# Patient Record
Sex: Female | Born: 1945
Health system: Southern US, Community
[De-identification: ages and names within clinical notes are randomized; demographics above are authoritative.]

## PROBLEM LIST (undated history)

## (undated) DIAGNOSIS — J449 Chronic obstructive pulmonary disease, unspecified: Secondary | ICD-10-CM

## (undated) DIAGNOSIS — R001 Bradycardia, unspecified: Secondary | ICD-10-CM

## (undated) DIAGNOSIS — K52832 Lymphocytic colitis: Secondary | ICD-10-CM

## (undated) DIAGNOSIS — K220 Achalasia of cardia: Secondary | ICD-10-CM

## (undated) DIAGNOSIS — C3490 Malignant neoplasm of unspecified part of unspecified bronchus or lung: Secondary | ICD-10-CM

## (undated) DIAGNOSIS — D649 Anemia, unspecified: Secondary | ICD-10-CM

## (undated) DIAGNOSIS — K3184 Gastroparesis: Secondary | ICD-10-CM

## (undated) DIAGNOSIS — J189 Pneumonia, unspecified organism: Secondary | ICD-10-CM

## (undated) DIAGNOSIS — M1990 Unspecified osteoarthritis, unspecified site: Secondary | ICD-10-CM

## (undated) DIAGNOSIS — K219 Gastro-esophageal reflux disease without esophagitis: Secondary | ICD-10-CM

## (undated) HISTORY — DX: Lymphocytic colitis: K52.832

## (undated) HISTORY — DX: Bradycardia, unspecified: R00.1

## (undated) HISTORY — PX: DILATION AND CURETTAGE OF UTERUS: SHX78

---

## 1978-11-29 HISTORY — PX: NECK SURGERY: SHX720

## 1998-01-18 ENCOUNTER — Other Ambulatory Visit: Admission: RE | Admit: 1998-01-18 | Discharge: 1998-01-18 | Payer: Self-pay | Admitting: Obstetrics and Gynecology

## 1999-02-12 ENCOUNTER — Other Ambulatory Visit: Admission: RE | Admit: 1999-02-12 | Discharge: 1999-02-12 | Payer: Self-pay | Admitting: Obstetrics and Gynecology

## 2000-03-09 ENCOUNTER — Other Ambulatory Visit: Admission: RE | Admit: 2000-03-09 | Discharge: 2000-03-09 | Payer: Self-pay | Admitting: Obstetrics and Gynecology

## 2001-04-06 ENCOUNTER — Other Ambulatory Visit: Admission: RE | Admit: 2001-04-06 | Discharge: 2001-04-06 | Payer: Self-pay | Admitting: Obstetrics and Gynecology

## 2002-07-13 ENCOUNTER — Other Ambulatory Visit: Admission: RE | Admit: 2002-07-13 | Discharge: 2002-07-13 | Payer: Self-pay | Admitting: Obstetrics and Gynecology

## 2003-09-03 ENCOUNTER — Other Ambulatory Visit: Admission: RE | Admit: 2003-09-03 | Discharge: 2003-09-03 | Payer: Self-pay | Admitting: Obstetrics and Gynecology

## 2004-10-16 ENCOUNTER — Other Ambulatory Visit: Admission: RE | Admit: 2004-10-16 | Discharge: 2004-10-16 | Payer: Self-pay | Admitting: Obstetrics and Gynecology

## 2008-03-30 DIAGNOSIS — C3490 Malignant neoplasm of unspecified part of unspecified bronchus or lung: Secondary | ICD-10-CM

## 2008-03-30 HISTORY — DX: Malignant neoplasm of unspecified part of unspecified bronchus or lung: C34.90

## 2009-03-30 HISTORY — PX: BRONCHOSCOPY: SUR163

## 2009-08-15 ENCOUNTER — Encounter: Admission: RE | Admit: 2009-08-15 | Discharge: 2009-08-15 | Payer: Self-pay | Admitting: Family Medicine

## 2009-08-27 ENCOUNTER — Ambulatory Visit: Payer: Self-pay | Admitting: Thoracic Surgery

## 2009-08-30 ENCOUNTER — Ambulatory Visit: Payer: Self-pay | Admitting: Thoracic Surgery

## 2009-08-30 ENCOUNTER — Ambulatory Visit (HOSPITAL_COMMUNITY): Admission: RE | Admit: 2009-08-30 | Discharge: 2009-08-30 | Payer: Self-pay | Admitting: Thoracic Surgery

## 2009-09-03 ENCOUNTER — Ambulatory Visit: Payer: Self-pay | Admitting: Internal Medicine

## 2009-09-04 ENCOUNTER — Ambulatory Visit (HOSPITAL_COMMUNITY): Admission: RE | Admit: 2009-09-04 | Discharge: 2009-09-04 | Payer: Self-pay | Admitting: Thoracic Surgery

## 2009-09-05 ENCOUNTER — Ambulatory Visit: Payer: Self-pay | Admitting: Internal Medicine

## 2009-09-05 ENCOUNTER — Ambulatory Visit: Admission: RE | Admit: 2009-09-05 | Discharge: 2009-09-26 | Payer: Self-pay | Admitting: Radiation Oncology

## 2009-09-10 LAB — CBC WITH DIFFERENTIAL/PLATELET
BASO%: 0.6 % (ref 0.0–2.0)
Basophils Absolute: 0.1 10*3/uL (ref 0.0–0.1)
EOS%: 0.6 % (ref 0.0–7.0)
Eosinophils Absolute: 0.1 10*3/uL (ref 0.0–0.5)
HCT: 41.9 % (ref 34.8–46.6)
HGB: 14.4 g/dL (ref 11.6–15.9)
LYMPH%: 13.4 % — ABNORMAL LOW (ref 14.0–49.7)
MCH: 29.7 pg (ref 25.1–34.0)
MCHC: 34.4 g/dL (ref 31.5–36.0)
MCV: 86.4 fL (ref 79.5–101.0)
MONO#: 1.2 10*3/uL — ABNORMAL HIGH (ref 0.1–0.9)
MONO%: 14.6 % — ABNORMAL HIGH (ref 0.0–14.0)
NEUT#: 5.8 10*3/uL (ref 1.5–6.5)
NEUT%: 70.8 % (ref 38.4–76.8)
Platelets: 256 10*3/uL (ref 145–400)
RBC: 4.85 10*6/uL (ref 3.70–5.45)
RDW: 13.2 % (ref 11.2–14.5)
WBC: 8.1 10*3/uL (ref 3.9–10.3)
lymph#: 1.1 10*3/uL (ref 0.9–3.3)

## 2009-09-10 LAB — COMPREHENSIVE METABOLIC PANEL
ALT: 11 U/L (ref 0–35)
AST: 19 U/L (ref 0–37)
Albumin: 3.9 g/dL (ref 3.5–5.2)
Alkaline Phosphatase: 61 U/L (ref 39–117)
BUN: 7 mg/dL (ref 6–23)
CO2: 27 mEq/L (ref 19–32)
Calcium: 9.9 mg/dL (ref 8.4–10.5)
Chloride: 105 mEq/L (ref 96–112)
Creatinine, Ser: 0.76 mg/dL (ref 0.40–1.20)
Glucose, Bld: 108 mg/dL — ABNORMAL HIGH (ref 70–99)
Potassium: 4.3 mEq/L (ref 3.5–5.3)
Sodium: 138 mEq/L (ref 135–145)
Total Bilirubin: 1.2 mg/dL (ref 0.3–1.2)
Total Protein: 6.7 g/dL (ref 6.0–8.3)

## 2009-09-17 LAB — CBC WITH DIFFERENTIAL/PLATELET
BASO%: 0.9 % (ref 0.0–2.0)
Basophils Absolute: 0 10*3/uL (ref 0.0–0.1)
EOS%: 0.7 % (ref 0.0–7.0)
Eosinophils Absolute: 0 10*3/uL (ref 0.0–0.5)
HCT: 37.9 % (ref 34.8–46.6)
HGB: 12.9 g/dL (ref 11.6–15.9)
LYMPH%: 19.4 % (ref 14.0–49.7)
MCH: 30.2 pg (ref 25.1–34.0)
MCHC: 34.2 g/dL (ref 31.5–36.0)
MCV: 88.4 fL (ref 79.5–101.0)
MONO#: 0.1 10*3/uL (ref 0.1–0.9)
MONO%: 3.9 % (ref 0.0–14.0)
NEUT#: 2.2 10*3/uL (ref 1.5–6.5)
NEUT%: 75.1 % (ref 38.4–76.8)
Platelets: 98 10*3/uL — ABNORMAL LOW (ref 145–400)
RBC: 4.28 10*6/uL (ref 3.70–5.45)
RDW: 13.6 % (ref 11.2–14.5)
WBC: 2.9 10*3/uL — ABNORMAL LOW (ref 3.9–10.3)
lymph#: 0.6 10*3/uL — ABNORMAL LOW (ref 0.9–3.3)

## 2009-09-17 LAB — COMPREHENSIVE METABOLIC PANEL
ALT: 17 U/L (ref 0–35)
AST: 13 U/L (ref 0–37)
Albumin: 4.3 g/dL (ref 3.5–5.2)
Alkaline Phosphatase: 90 U/L (ref 39–117)
BUN: 18 mg/dL (ref 6–23)
CO2: 27 mEq/L (ref 19–32)
Calcium: 10.1 mg/dL (ref 8.4–10.5)
Chloride: 101 mEq/L (ref 96–112)
Creatinine, Ser: 0.77 mg/dL (ref 0.40–1.20)
Glucose, Bld: 117 mg/dL — ABNORMAL HIGH (ref 70–99)
Potassium: 3.9 mEq/L (ref 3.5–5.3)
Sodium: 135 mEq/L (ref 135–145)
Total Bilirubin: 1.2 mg/dL (ref 0.3–1.2)
Total Protein: 6.7 g/dL (ref 6.0–8.3)

## 2009-09-24 LAB — CBC WITH DIFFERENTIAL/PLATELET
BASO%: 0.1 % (ref 0.0–2.0)
Basophils Absolute: 0 10*3/uL (ref 0.0–0.1)
EOS%: 0.2 % (ref 0.0–7.0)
Eosinophils Absolute: 0 10*3/uL (ref 0.0–0.5)
HCT: 33.3 % — ABNORMAL LOW (ref 34.8–46.6)
HGB: 11.6 g/dL (ref 11.6–15.9)
LYMPH%: 9.2 % — ABNORMAL LOW (ref 14.0–49.7)
MCH: 30.4 pg (ref 25.1–34.0)
MCHC: 35 g/dL (ref 31.5–36.0)
MCV: 86.9 fL (ref 79.5–101.0)
MONO#: 0.3 10*3/uL (ref 0.1–0.9)
MONO%: 3.7 % (ref 0.0–14.0)
NEUT#: 7.5 10*3/uL — ABNORMAL HIGH (ref 1.5–6.5)
NEUT%: 86.8 % — ABNORMAL HIGH (ref 38.4–76.8)
Platelets: 27 10*3/uL — ABNORMAL LOW (ref 145–400)
RBC: 3.83 10*6/uL (ref 3.70–5.45)
RDW: 13.3 % (ref 11.2–14.5)
WBC: 8.6 10*3/uL (ref 3.9–10.3)
lymph#: 0.8 10*3/uL — ABNORMAL LOW (ref 0.9–3.3)

## 2009-09-24 LAB — COMPREHENSIVE METABOLIC PANEL
ALT: 16 U/L (ref 0–35)
AST: 13 U/L (ref 0–37)
Albumin: 3.9 g/dL (ref 3.5–5.2)
Alkaline Phosphatase: 106 U/L (ref 39–117)
BUN: 8 mg/dL (ref 6–23)
CO2: 27 mEq/L (ref 19–32)
Calcium: 9.7 mg/dL (ref 8.4–10.5)
Chloride: 104 mEq/L (ref 96–112)
Creatinine, Ser: 0.84 mg/dL (ref 0.40–1.20)
Glucose, Bld: 104 mg/dL — ABNORMAL HIGH (ref 70–99)
Potassium: 3.9 mEq/L (ref 3.5–5.3)
Sodium: 140 mEq/L (ref 135–145)
Total Bilirubin: 0.3 mg/dL (ref 0.3–1.2)
Total Protein: 6.2 g/dL (ref 6.0–8.3)

## 2009-09-27 ENCOUNTER — Ambulatory Visit: Admission: RE | Admit: 2009-09-27 | Discharge: 2009-10-29 | Payer: Self-pay | Admitting: Radiation Oncology

## 2009-10-01 LAB — COMPREHENSIVE METABOLIC PANEL
ALT: 10 U/L (ref 0–35)
AST: 12 U/L (ref 0–37)
Albumin: 4 g/dL (ref 3.5–5.2)
Alkaline Phosphatase: 73 U/L (ref 39–117)
BUN: 8 mg/dL (ref 6–23)
CO2: 24 mEq/L (ref 19–32)
Calcium: 10.1 mg/dL (ref 8.4–10.5)
Chloride: 106 mEq/L (ref 96–112)
Creatinine, Ser: 0.68 mg/dL (ref 0.40–1.20)
Glucose, Bld: 95 mg/dL (ref 70–99)
Potassium: 3.9 mEq/L (ref 3.5–5.3)
Sodium: 140 mEq/L (ref 135–145)
Total Bilirubin: 0.4 mg/dL (ref 0.3–1.2)
Total Protein: 6.3 g/dL (ref 6.0–8.3)

## 2009-10-01 LAB — CBC WITH DIFFERENTIAL/PLATELET
BASO%: 0.3 % (ref 0.0–2.0)
Basophils Absolute: 0 10*3/uL (ref 0.0–0.1)
EOS%: 0.1 % (ref 0.0–7.0)
Eosinophils Absolute: 0 10*3/uL (ref 0.0–0.5)
HCT: 31.3 % — ABNORMAL LOW (ref 34.8–46.6)
HGB: 10.7 g/dL — ABNORMAL LOW (ref 11.6–15.9)
LYMPH%: 6.7 % — ABNORMAL LOW (ref 14.0–49.7)
MCH: 29.1 pg (ref 25.1–34.0)
MCHC: 34.2 g/dL (ref 31.5–36.0)
MCV: 85.1 fL (ref 79.5–101.0)
MONO#: 1 10*3/uL — ABNORMAL HIGH (ref 0.1–0.9)
MONO%: 11 % (ref 0.0–14.0)
NEUT#: 7.3 10*3/uL — ABNORMAL HIGH (ref 1.5–6.5)
NEUT%: 81.9 % — ABNORMAL HIGH (ref 38.4–76.8)
Platelets: 376 10*3/uL (ref 145–400)
RBC: 3.68 10*6/uL — ABNORMAL LOW (ref 3.70–5.45)
RDW: 13.4 % (ref 11.2–14.5)
WBC: 8.9 10*3/uL (ref 3.9–10.3)
lymph#: 0.6 10*3/uL — ABNORMAL LOW (ref 0.9–3.3)
nRBC: 0 % (ref 0–0)

## 2009-10-03 ENCOUNTER — Ambulatory Visit: Payer: Self-pay | Admitting: Internal Medicine

## 2009-10-08 LAB — CBC WITH DIFFERENTIAL/PLATELET
BASO%: 0 % (ref 0.0–2.0)
Basophils Absolute: 0 10*3/uL (ref 0.0–0.1)
EOS%: 0.1 % (ref 0.0–7.0)
Eosinophils Absolute: 0 10*3/uL (ref 0.0–0.5)
HCT: 28.3 % — ABNORMAL LOW (ref 34.8–46.6)
HGB: 9.9 g/dL — ABNORMAL LOW (ref 11.6–15.9)
LYMPH%: 4.4 % — ABNORMAL LOW (ref 14.0–49.7)
MCH: 30.7 pg (ref 25.1–34.0)
MCHC: 34.9 g/dL (ref 31.5–36.0)
MCV: 87.9 fL (ref 79.5–101.0)
MONO#: 0 10*3/uL — ABNORMAL LOW (ref 0.1–0.9)
MONO%: 0.4 % (ref 0.0–14.0)
NEUT#: 7.8 10*3/uL — ABNORMAL HIGH (ref 1.5–6.5)
NEUT%: 95.1 % — ABNORMAL HIGH (ref 38.4–76.8)
Platelets: 273 10*3/uL (ref 145–400)
RBC: 3.22 10*6/uL — ABNORMAL LOW (ref 3.70–5.45)
RDW: 13.7 % (ref 11.2–14.5)
WBC: 8.2 10*3/uL (ref 3.9–10.3)
lymph#: 0.4 10*3/uL — ABNORMAL LOW (ref 0.9–3.3)

## 2009-10-08 LAB — COMPREHENSIVE METABOLIC PANEL
ALT: 12 U/L (ref 0–35)
AST: 13 U/L (ref 0–37)
Albumin: 3.9 g/dL (ref 3.5–5.2)
Alkaline Phosphatase: 91 U/L (ref 39–117)
BUN: 17 mg/dL (ref 6–23)
CO2: 25 mEq/L (ref 19–32)
Calcium: 10 mg/dL (ref 8.4–10.5)
Chloride: 102 mEq/L (ref 96–112)
Creatinine, Ser: 0.83 mg/dL (ref 0.40–1.20)
Glucose, Bld: 106 mg/dL — ABNORMAL HIGH (ref 70–99)
Potassium: 3.8 mEq/L (ref 3.5–5.3)
Sodium: 138 mEq/L (ref 135–145)
Total Bilirubin: 1.4 mg/dL — ABNORMAL HIGH (ref 0.3–1.2)
Total Protein: 6.3 g/dL (ref 6.0–8.3)

## 2009-10-15 LAB — CBC WITH DIFFERENTIAL/PLATELET
BASO%: 0.2 % (ref 0.0–2.0)
Basophils Absolute: 0 10*3/uL (ref 0.0–0.1)
EOS%: 0 % (ref 0.0–7.0)
Eosinophils Absolute: 0 10*3/uL (ref 0.0–0.5)
HCT: 25.5 % — ABNORMAL LOW (ref 34.8–46.6)
HGB: 9 g/dL — ABNORMAL LOW (ref 11.6–15.9)
LYMPH%: 6 % — ABNORMAL LOW (ref 14.0–49.7)
MCH: 30.9 pg (ref 25.1–34.0)
MCHC: 35.2 g/dL (ref 31.5–36.0)
MCV: 87.8 fL (ref 79.5–101.0)
MONO#: 0.4 10*3/uL (ref 0.1–0.9)
MONO%: 5 % (ref 0.0–14.0)
NEUT#: 7.4 10*3/uL — ABNORMAL HIGH (ref 1.5–6.5)
NEUT%: 88.8 % — ABNORMAL HIGH (ref 38.4–76.8)
Platelets: 15 10*3/uL — ABNORMAL LOW (ref 145–400)
RBC: 2.91 10*6/uL — ABNORMAL LOW (ref 3.70–5.45)
RDW: 12.9 % (ref 11.2–14.5)
WBC: 8.3 10*3/uL (ref 3.9–10.3)
lymph#: 0.5 10*3/uL — ABNORMAL LOW (ref 0.9–3.3)

## 2009-10-15 LAB — COMPREHENSIVE METABOLIC PANEL
ALT: 20 U/L (ref 0–35)
AST: 18 U/L (ref 0–37)
Albumin: 3.7 g/dL (ref 3.5–5.2)
Alkaline Phosphatase: 91 U/L (ref 39–117)
BUN: 6 mg/dL (ref 6–23)
CO2: 30 mEq/L (ref 19–32)
Calcium: 9.7 mg/dL (ref 8.4–10.5)
Chloride: 103 mEq/L (ref 96–112)
Creatinine, Ser: 0.98 mg/dL (ref 0.40–1.20)
Glucose, Bld: 100 mg/dL — ABNORMAL HIGH (ref 70–99)
Potassium: 3.4 mEq/L — ABNORMAL LOW (ref 3.5–5.3)
Sodium: 139 mEq/L (ref 135–145)
Total Bilirubin: 0.6 mg/dL (ref 0.3–1.2)
Total Protein: 6.3 g/dL (ref 6.0–8.3)

## 2009-10-18 ENCOUNTER — Ambulatory Visit (HOSPITAL_COMMUNITY): Admission: RE | Admit: 2009-10-18 | Discharge: 2009-10-18 | Payer: Self-pay | Admitting: Internal Medicine

## 2009-10-22 LAB — CBC WITH DIFFERENTIAL/PLATELET
BASO%: 0.2 % (ref 0.0–2.0)
Basophils Absolute: 0 10*3/uL (ref 0.0–0.1)
EOS%: 0 % (ref 0.0–7.0)
Eosinophils Absolute: 0 10*3/uL (ref 0.0–0.5)
HCT: 25.4 % — ABNORMAL LOW (ref 34.8–46.6)
HGB: 8.7 g/dL — ABNORMAL LOW (ref 11.6–15.9)
LYMPH%: 8.6 % — ABNORMAL LOW (ref 14.0–49.7)
MCH: 29.2 pg (ref 25.1–34.0)
MCHC: 34.3 g/dL (ref 31.5–36.0)
MCV: 85.2 fL (ref 79.5–101.0)
MONO#: 0.6 10*3/uL (ref 0.1–0.9)
MONO%: 5.1 % (ref 0.0–14.0)
NEUT#: 9.8 10*3/uL — ABNORMAL HIGH (ref 1.5–6.5)
NEUT%: 86.1 % — ABNORMAL HIGH (ref 38.4–76.8)
Platelets: 203 10*3/uL (ref 145–400)
RBC: 2.98 10*6/uL — ABNORMAL LOW (ref 3.70–5.45)
RDW: 14.3 % (ref 11.2–14.5)
WBC: 11.4 10*3/uL — ABNORMAL HIGH (ref 3.9–10.3)
lymph#: 1 10*3/uL (ref 0.9–3.3)

## 2009-10-22 LAB — COMPREHENSIVE METABOLIC PANEL
ALT: <8 U/L (ref 0–35)
AST: 14 U/L (ref 0–37)
Albumin: 3.8 g/dL (ref 3.5–5.2)
Alkaline Phosphatase: 77 U/L (ref 39–117)
BUN: 8 mg/dL (ref 6–23)
CO2: 21 mEq/L (ref 19–32)
Calcium: 9.5 mg/dL (ref 8.4–10.5)
Chloride: 103 mEq/L (ref 96–112)
Creatinine, Ser: 0.91 mg/dL (ref 0.40–1.20)
Glucose, Bld: 115 mg/dL — ABNORMAL HIGH (ref 70–99)
Potassium: 3.5 mEq/L (ref 3.5–5.3)
Sodium: 138 mEq/L (ref 135–145)
Total Bilirubin: 0.4 mg/dL (ref 0.3–1.2)
Total Protein: 6.2 g/dL (ref 6.0–8.3)

## 2009-10-29 ENCOUNTER — Encounter (HOSPITAL_COMMUNITY): Admission: RE | Admit: 2009-10-29 | Discharge: 2009-12-24 | Payer: Self-pay | Admitting: Internal Medicine

## 2009-10-30 LAB — TYPE & CROSSMATCH - CHCC

## 2009-11-05 ENCOUNTER — Ambulatory Visit: Payer: Self-pay | Admitting: Internal Medicine

## 2009-11-05 LAB — COMPREHENSIVE METABOLIC PANEL
ALT: 25 U/L (ref 0–35)
AST: 20 U/L (ref 0–37)
Albumin: 4.1 g/dL (ref 3.5–5.2)
Alkaline Phosphatase: 100 U/L (ref 39–117)
BUN: 6 mg/dL (ref 6–23)
CO2: 21 mEq/L (ref 19–32)
Calcium: 9.3 mg/dL (ref 8.4–10.5)
Chloride: 105 mEq/L (ref 96–112)
Creatinine, Ser: 0.77 mg/dL (ref 0.40–1.20)
Glucose, Bld: 85 mg/dL (ref 70–99)
Potassium: 3.5 mEq/L (ref 3.5–5.3)
Sodium: 140 mEq/L (ref 135–145)
Total Bilirubin: 0.5 mg/dL (ref 0.3–1.2)
Total Protein: 6.1 g/dL (ref 6.0–8.3)

## 2009-11-05 LAB — CBC WITH DIFFERENTIAL/PLATELET
BASO%: 0 % (ref 0.0–2.0)
Basophils Absolute: 0 10*3/uL (ref 0.0–0.1)
EOS%: 0.6 % (ref 0.0–7.0)
Eosinophils Absolute: 0.1 10*3/uL (ref 0.0–0.5)
HCT: 24.5 % — ABNORMAL LOW (ref 34.8–46.6)
HGB: 8.7 g/dL — ABNORMAL LOW (ref 11.6–15.9)
LYMPH%: 6 % — ABNORMAL LOW (ref 14.0–49.7)
MCH: 31.5 pg (ref 25.1–34.0)
MCHC: 35.7 g/dL (ref 31.5–36.0)
MCV: 88.3 fL (ref 79.5–101.0)
MONO#: 0.6 10*3/uL (ref 0.1–0.9)
MONO%: 5.9 % (ref 0.0–14.0)
NEUT#: 8.5 10*3/uL — ABNORMAL HIGH (ref 1.5–6.5)
NEUT%: 87.5 % — ABNORMAL HIGH (ref 38.4–76.8)
Platelets: 11 10*3/uL — ABNORMAL LOW (ref 145–400)
RBC: 2.77 10*6/uL — ABNORMAL LOW (ref 3.70–5.45)
RDW: 14 % (ref 11.2–14.5)
WBC: 9.7 10*3/uL (ref 3.9–10.3)
lymph#: 0.6 10*3/uL — ABNORMAL LOW (ref 0.9–3.3)

## 2009-11-12 LAB — CBC WITH DIFFERENTIAL/PLATELET
BASO%: 0.4 % (ref 0.0–2.0)
Basophils Absolute: 0 10*3/uL (ref 0.0–0.1)
EOS%: 0.4 % (ref 0.0–7.0)
Eosinophils Absolute: 0 10*3/uL (ref 0.0–0.5)
HCT: 25.6 % — ABNORMAL LOW (ref 34.8–46.6)
HGB: 8.9 g/dL — ABNORMAL LOW (ref 11.6–15.9)
LYMPH%: 7.6 % — ABNORMAL LOW (ref 14.0–49.7)
MCH: 30 pg (ref 25.1–34.0)
MCHC: 34.8 g/dL (ref 31.5–36.0)
MCV: 86.2 fL (ref 79.5–101.0)
MONO#: 0.9 10*3/uL (ref 0.1–0.9)
MONO%: 11.4 % (ref 0.0–14.0)
NEUT#: 6 10*3/uL (ref 1.5–6.5)
NEUT%: 80.2 % — ABNORMAL HIGH (ref 38.4–76.8)
Platelets: 161 10*3/uL (ref 145–400)
RBC: 2.97 10*6/uL — ABNORMAL LOW (ref 3.70–5.45)
RDW: 15.5 % — ABNORMAL HIGH (ref 11.2–14.5)
WBC: 7.5 10*3/uL (ref 3.9–10.3)
lymph#: 0.6 10*3/uL — ABNORMAL LOW (ref 0.9–3.3)
nRBC: 0 % (ref 0–0)

## 2009-11-12 LAB — COMPREHENSIVE METABOLIC PANEL
ALT: 10 U/L (ref 0–35)
AST: 15 U/L (ref 0–37)
Albumin: 4 g/dL (ref 3.5–5.2)
Alkaline Phosphatase: 86 U/L (ref 39–117)
BUN: 6 mg/dL (ref 6–23)
CO2: 23 mEq/L (ref 19–32)
Calcium: 9.1 mg/dL (ref 8.4–10.5)
Chloride: 107 mEq/L (ref 96–112)
Creatinine, Ser: 0.71 mg/dL (ref 0.40–1.20)
Glucose, Bld: 106 mg/dL — ABNORMAL HIGH (ref 70–99)
Potassium: 3.3 mEq/L — ABNORMAL LOW (ref 3.5–5.3)
Sodium: 141 mEq/L (ref 135–145)
Total Bilirubin: 0.4 mg/dL (ref 0.3–1.2)
Total Protein: 6.1 g/dL (ref 6.0–8.3)

## 2009-11-19 LAB — COMPREHENSIVE METABOLIC PANEL
ALT: 9 U/L (ref 0–35)
AST: 11 U/L (ref 0–37)
Albumin: 4 g/dL (ref 3.5–5.2)
Alkaline Phosphatase: 91 U/L (ref 39–117)
BUN: 15 mg/dL (ref 6–23)
CO2: 24 mEq/L (ref 19–32)
Calcium: 9.2 mg/dL (ref 8.4–10.5)
Chloride: 101 mEq/L (ref 96–112)
Creatinine, Ser: 0.54 mg/dL (ref 0.40–1.20)
Glucose, Bld: 105 mg/dL — ABNORMAL HIGH (ref 70–99)
Potassium: 3.6 mEq/L (ref 3.5–5.3)
Sodium: 136 mEq/L (ref 135–145)
Total Bilirubin: 1.5 mg/dL — ABNORMAL HIGH (ref 0.3–1.2)
Total Protein: 6.2 g/dL (ref 6.0–8.3)

## 2009-11-19 LAB — CBC WITH DIFFERENTIAL/PLATELET
BASO%: 0.9 % (ref 0.0–2.0)
Basophils Absolute: 0 10*3/uL (ref 0.0–0.1)
EOS%: 0.1 % (ref 0.0–7.0)
Eosinophils Absolute: 0 10*3/uL (ref 0.0–0.5)
HCT: 21.2 % — ABNORMAL LOW (ref 34.8–46.6)
HGB: 7.5 g/dL — ABNORMAL LOW (ref 11.6–15.9)
LYMPH%: 10.5 % — ABNORMAL LOW (ref 14.0–49.7)
MCH: 32.3 pg (ref 25.1–34.0)
MCHC: 35.4 g/dL (ref 31.5–36.0)
MCV: 91.4 fL (ref 79.5–101.0)
MONO#: 0.1 10*3/uL (ref 0.1–0.9)
MONO%: 2.8 % (ref 0.0–14.0)
NEUT#: 2.5 10*3/uL (ref 1.5–6.5)
NEUT%: 85.7 % — ABNORMAL HIGH (ref 38.4–76.8)
Platelets: 122 10*3/uL — ABNORMAL LOW (ref 145–400)
RBC: 2.32 10*6/uL — ABNORMAL LOW (ref 3.70–5.45)
RDW: 16.5 % — ABNORMAL HIGH (ref 11.2–14.5)
WBC: 2.9 10*3/uL — ABNORMAL LOW (ref 3.9–10.3)
lymph#: 0.3 10*3/uL — ABNORMAL LOW (ref 0.9–3.3)

## 2009-11-20 LAB — TYPE & CROSSMATCH - CHCC

## 2009-11-29 ENCOUNTER — Ambulatory Visit (HOSPITAL_COMMUNITY): Admission: RE | Admit: 2009-11-29 | Discharge: 2009-11-29 | Payer: Self-pay | Admitting: Internal Medicine

## 2009-12-03 LAB — COMPREHENSIVE METABOLIC PANEL
ALT: 12 U/L (ref 0–35)
AST: 16 U/L (ref 0–37)
Albumin: 3.3 g/dL — ABNORMAL LOW (ref 3.5–5.2)
Alkaline Phosphatase: 80 U/L (ref 39–117)
BUN: 4 mg/dL — ABNORMAL LOW (ref 6–23)
CO2: 30 mEq/L (ref 19–32)
Calcium: 8.6 mg/dL (ref 8.4–10.5)
Chloride: 102 mEq/L (ref 96–112)
Creatinine, Ser: 0.78 mg/dL (ref 0.40–1.20)
Glucose, Bld: 100 mg/dL — ABNORMAL HIGH (ref 70–99)
Potassium: 3.2 mEq/L — ABNORMAL LOW (ref 3.5–5.3)
Sodium: 138 mEq/L (ref 135–145)
Total Bilirubin: 0.5 mg/dL (ref 0.3–1.2)
Total Protein: 5.7 g/dL — ABNORMAL LOW (ref 6.0–8.3)

## 2009-12-03 LAB — CBC WITH DIFFERENTIAL/PLATELET
BASO%: 0.1 % (ref 0.0–2.0)
Basophils Absolute: 0 10*3/uL (ref 0.0–0.1)
EOS%: 0.2 % (ref 0.0–7.0)
Eosinophils Absolute: 0 10*3/uL (ref 0.0–0.5)
HCT: 27.8 % — ABNORMAL LOW (ref 34.8–46.6)
HGB: 9.1 g/dL — ABNORMAL LOW (ref 11.6–15.9)
LYMPH%: 5.9 % — ABNORMAL LOW (ref 14.0–49.7)
MCH: 30.1 pg (ref 25.1–34.0)
MCHC: 32.8 g/dL (ref 31.5–36.0)
MCV: 92 fL (ref 79.5–101.0)
MONO#: 1.1 10*3/uL — ABNORMAL HIGH (ref 0.1–0.9)
MONO%: 9.9 % (ref 0.0–14.0)
NEUT#: 9.2 10*3/uL — ABNORMAL HIGH (ref 1.5–6.5)
NEUT%: 83.9 % — ABNORMAL HIGH (ref 38.4–76.8)
Platelets: 276 10*3/uL (ref 145–400)
RBC: 3.02 10*6/uL — ABNORMAL LOW (ref 3.70–5.45)
RDW: 16.3 % — ABNORMAL HIGH (ref 11.2–14.5)
WBC: 11 10*3/uL — ABNORMAL HIGH (ref 3.9–10.3)
lymph#: 0.6 10*3/uL — ABNORMAL LOW (ref 0.9–3.3)

## 2009-12-30 ENCOUNTER — Encounter: Admission: RE | Admit: 2009-12-30 | Discharge: 2009-12-30 | Payer: Self-pay | Admitting: Radiation Oncology

## 2010-01-09 ENCOUNTER — Ambulatory Visit: Admission: RE | Admit: 2010-01-09 | Discharge: 2010-01-31 | Payer: Self-pay | Admitting: Radiation Oncology

## 2010-02-03 ENCOUNTER — Other Ambulatory Visit: Payer: Self-pay | Admitting: Internal Medicine

## 2010-02-03 ENCOUNTER — Ambulatory Visit (HOSPITAL_COMMUNITY): Admission: RE | Admit: 2010-02-03 | Discharge: 2010-02-03 | Payer: Self-pay | Admitting: Internal Medicine

## 2010-02-03 LAB — CBC WITH DIFFERENTIAL/PLATELET
BASO%: 0 % (ref 0.0–2.0)
Basophils Absolute: 0 10*3/uL (ref 0.0–0.1)
EOS%: 0 % (ref 0.0–7.0)
Eosinophils Absolute: 0 10*3/uL (ref 0.0–0.5)
HCT: 35.2 % (ref 34.8–46.6)
HGB: 12 g/dL (ref 11.6–15.9)
LYMPH%: 4.2 % — ABNORMAL LOW (ref 14.0–49.7)
MCH: 32.4 pg (ref 25.1–34.0)
MCHC: 34.1 g/dL (ref 31.5–36.0)
MCV: 94.9 fL (ref 79.5–101.0)
MONO#: 0.5 10*3/uL (ref 0.1–0.9)
MONO%: 5.9 % (ref 0.0–14.0)
NEUT#: 7.6 10*3/uL — ABNORMAL HIGH (ref 1.5–6.5)
NEUT%: 89.9 % — ABNORMAL HIGH (ref 38.4–76.8)
Platelets: 227 10*3/uL (ref 145–400)
RBC: 3.71 10*6/uL (ref 3.70–5.45)
RDW: 13.7 % (ref 11.2–14.5)
WBC: 8.5 10*3/uL (ref 3.9–10.3)
lymph#: 0.4 10*3/uL — ABNORMAL LOW (ref 0.9–3.3)

## 2010-02-03 LAB — COMPREHENSIVE METABOLIC PANEL
ALT: 12 U/L (ref 0–35)
AST: 15 U/L (ref 0–37)
Albumin: 4.2 g/dL (ref 3.5–5.2)
Alkaline Phosphatase: 75 U/L (ref 39–117)
BUN: 16 mg/dL (ref 6–23)
CO2: 36 mEq/L — ABNORMAL HIGH (ref 19–32)
Calcium: 10 mg/dL (ref 8.4–10.5)
Chloride: 99 mEq/L (ref 96–112)
Creatinine, Ser: 0.74 mg/dL (ref 0.40–1.20)
Glucose, Bld: 105 mg/dL — ABNORMAL HIGH (ref 70–99)
Potassium: 3.7 mEq/L (ref 3.5–5.3)
Sodium: 138 mEq/L (ref 135–145)
Total Bilirubin: 0.7 mg/dL (ref 0.3–1.2)
Total Protein: 7.4 g/dL (ref 6.0–8.3)

## 2010-03-30 HISTORY — PX: UTERINE FIBROID SURGERY: SHX826

## 2010-04-18 ENCOUNTER — Other Ambulatory Visit: Payer: Self-pay | Admitting: Internal Medicine

## 2010-04-18 DIAGNOSIS — C3490 Malignant neoplasm of unspecified part of unspecified bronchus or lung: Secondary | ICD-10-CM

## 2010-05-05 ENCOUNTER — Other Ambulatory Visit: Payer: Self-pay | Admitting: Internal Medicine

## 2010-05-05 ENCOUNTER — Encounter: Payer: BC Managed Care – PPO | Admitting: Internal Medicine

## 2010-05-05 ENCOUNTER — Ambulatory Visit (HOSPITAL_COMMUNITY)
Admission: RE | Admit: 2010-05-05 | Discharge: 2010-05-05 | Disposition: A | Discharge status: Home / Self Care | Payer: BC Managed Care – PPO | Source: Ambulatory Visit | Attending: Internal Medicine | Admitting: Internal Medicine

## 2010-05-05 DIAGNOSIS — Z09 Encounter for follow-up examination after completed treatment for conditions other than malignant neoplasm: Secondary | ICD-10-CM | POA: Insufficient documentation

## 2010-05-05 DIAGNOSIS — C3410 Malignant neoplasm of upper lobe, unspecified bronchus or lung: Secondary | ICD-10-CM

## 2010-05-05 DIAGNOSIS — J90 Pleural effusion, not elsewhere classified: Secondary | ICD-10-CM | POA: Insufficient documentation

## 2010-05-05 DIAGNOSIS — C3490 Malignant neoplasm of unspecified part of unspecified bronchus or lung: Secondary | ICD-10-CM

## 2010-05-05 DIAGNOSIS — R1909 Other intra-abdominal and pelvic swelling, mass and lump: Secondary | ICD-10-CM | POA: Insufficient documentation

## 2010-05-05 DIAGNOSIS — J984 Other disorders of lung: Secondary | ICD-10-CM | POA: Insufficient documentation

## 2010-05-05 DIAGNOSIS — K7689 Other specified diseases of liver: Secondary | ICD-10-CM | POA: Insufficient documentation

## 2010-05-05 LAB — CBC WITH DIFFERENTIAL/PLATELET
BASO%: 0.8 % (ref 0.0–2.0)
Basophils Absolute: 0.1 10*3/uL (ref 0.0–0.1)
EOS%: 1 % (ref 0.0–7.0)
Eosinophils Absolute: 0.1 10*3/uL (ref 0.0–0.5)
HCT: 34.5 % — ABNORMAL LOW (ref 34.8–46.6)
HGB: 11.8 g/dL (ref 11.6–15.9)
LYMPH%: 11.2 % — ABNORMAL LOW (ref 14.0–49.7)
MCH: 30.8 pg (ref 25.1–34.0)
MCHC: 34.2 g/dL (ref 31.5–36.0)
MCV: 90.1 fL (ref 79.5–101.0)
MONO#: 0.8 10*3/uL (ref 0.1–0.9)
MONO%: 11.1 % (ref 0.0–14.0)
NEUT#: 5.7 10*3/uL (ref 1.5–6.5)
NEUT%: 75.9 % (ref 38.4–76.8)
Platelets: 189 10*3/uL (ref 145–400)
RBC: 3.84 10*6/uL (ref 3.70–5.45)
RDW: 16 % — ABNORMAL HIGH (ref 11.2–14.5)
WBC: 7.4 10*3/uL (ref 3.9–10.3)
lymph#: 0.8 10*3/uL — ABNORMAL LOW (ref 0.9–3.3)

## 2010-05-05 LAB — CMP (CANCER CENTER ONLY)
ALT(SGPT): 15 U/L (ref 10–47)
AST: 19 U/L (ref 11–38)
Albumin: 3.7 g/dL (ref 3.3–5.5)
Alkaline Phosphatase: 76 U/L (ref 26–84)
BUN, Bld: 13 mg/dL (ref 7–22)
CO2: 29 mEq/L (ref 18–33)
Calcium: 9.7 mg/dL (ref 8.0–10.3)
Chloride: 102 mEq/L (ref 98–108)
Creat: 0.8 mg/dl (ref 0.6–1.2)
Glucose, Bld: 96 mg/dL (ref 73–118)
Potassium: 3.9 mEq/L (ref 3.3–4.7)
Sodium: 146 mEq/L — ABNORMAL HIGH (ref 128–145)
Total Bilirubin: 0.6 mg/dl (ref 0.20–1.60)
Total Protein: 7.1 g/dL (ref 6.4–8.1)

## 2010-05-07 ENCOUNTER — Encounter (HOSPITAL_BASED_OUTPATIENT_CLINIC_OR_DEPARTMENT_OTHER): Payer: BC Managed Care – PPO | Admitting: Internal Medicine

## 2010-05-07 ENCOUNTER — Other Ambulatory Visit: Payer: Self-pay | Admitting: Internal Medicine

## 2010-05-07 DIAGNOSIS — C3410 Malignant neoplasm of upper lobe, unspecified bronchus or lung: Secondary | ICD-10-CM

## 2010-05-07 DIAGNOSIS — C3490 Malignant neoplasm of unspecified part of unspecified bronchus or lung: Secondary | ICD-10-CM

## 2010-05-07 DIAGNOSIS — C7931 Secondary malignant neoplasm of brain: Secondary | ICD-10-CM

## 2010-05-09 ENCOUNTER — Encounter (HOSPITAL_COMMUNITY)
Admission: RE | Admit: 2010-05-09 | Discharge: 2010-05-09 | Disposition: A | Discharge status: Home / Self Care | Payer: BC Managed Care – PPO | Source: Ambulatory Visit | Attending: Obstetrics and Gynecology | Admitting: Obstetrics and Gynecology

## 2010-05-09 DIAGNOSIS — Z01810 Encounter for preprocedural cardiovascular examination: Secondary | ICD-10-CM | POA: Insufficient documentation

## 2010-05-09 DIAGNOSIS — Z01812 Encounter for preprocedural laboratory examination: Secondary | ICD-10-CM | POA: Insufficient documentation

## 2010-05-09 LAB — CBC
HCT: 35.9 % — ABNORMAL LOW (ref 36.0–46.0)
Hemoglobin: 11.4 g/dL — ABNORMAL LOW (ref 12.0–15.0)
MCH: 28.7 pg (ref 26.0–34.0)
MCHC: 31.8 g/dL (ref 30.0–36.0)
MCV: 90.4 fL (ref 78.0–100.0)
Platelets: 192 10*3/uL (ref 150–400)
RBC: 3.97 MIL/uL (ref 3.87–5.11)
RDW: 15.2 % (ref 11.5–15.5)
WBC: 4.9 10*3/uL (ref 4.0–10.5)

## 2010-05-15 NOTE — H&P (Addendum)
  Claudia [name], Claudia [name]               ACCOUNT NO.:  [id number]  MEDICAL RECORD NO.:  [id number]          PATIENT TYPE:  AMB  LOCATION:  [facility]                           FACILITY:  [facility]  PHYSICIAN:  Duke Salvia. Marcelle Overlie, M.D.DATE OF BIRTH:  March 03, 1946  DATE OF ADMISSION: DATE OF DISCHARGE:                             HISTORY & PHYSICAL   CHIEF COMPLAINT:  Endometrial polyps.  HISTORY OF PRESENT ILLNESS:  A 65 year old postmenopausal G1, P1.  This patient in the spring of 2011 was diagnosed with lung cancer, and has had both radiation and chemotherapy.  As a part of her followup, she had abdominal and pelvic CT on February 03, 2010 that showed possible radiation pneumonitis and 3.3-cm probable fibroid.  She has had no postmenopausal bleeding.  We scheduled ultrasound in our office on March 05, 2010 that showed possibility of an endometrial polyp.  SHG was recommended, which was carried out on April 17, 2010 in our office.  Findings at that time showed a 3.2 x 2.0 intramural fibroid. Adnexa negative.  Two well-defined polyps inside the uterus.  She presents now for D and C hysteroscopy with resection.  This procedure including risks related to bleeding, infection, other complications, additional surgery discussed with her, which she understands and accepts.  ALLERGIES:  None.  CURRENT MEDICATIONS:  None.  REVIEW OF SYSTEMS:  Significant for treated lung cancer.  Review of systems otherwise unremarkable.  FAMILY HISTORY:  Positive for unspecified type of cancer.  SOCIAL HISTORY:  Denies tobacco or drug use.  She is married.  Dr. Gerri Spore is her PCP.  One alcoholic drink per month.  PHYSICAL EXAMINATION:  VITAL SIGNS:  Temperature 98.2, blood pressure 132/78. HEENT:  Unremarkable. NECK:  Supple without masses. LUNGS:  Clear. CARDIOVASCULAR:  Regular rate and rhythm without murmurs, rubs, or gallops noted.  BREASTS:  Without masses. ABDOMEN:  Soft, flat, nontender. PELVIC:   Normal external genitalia.  Vagina and cervix clear.  Uterus midposition, normal size.  Adnexa negative. EXTREMITIES:  Unremarkable. NEUROLOGIC:  Unremarkable.  IMPRESSION:  Endometrial polyps.  PLAN:  D and C hysteroscopy.  Procedure and risks reviewed as above.     Onaje Warne M. Marcelle Overlie, M.D.     RMH/MEDQ  D:  05/12/2010  T:  05/13/2010  Job:  [id number]  Electronically Signed by Richarda Overlie M.D. on 05/21/2010 06:39:21 PM

## 2010-05-16 ENCOUNTER — Other Ambulatory Visit: Payer: Self-pay | Admitting: Obstetrics and Gynecology

## 2010-05-16 ENCOUNTER — Ambulatory Visit (HOSPITAL_COMMUNITY)
Admission: RE | Admit: 2010-05-16 | Discharge: 2010-05-16 | Disposition: A | Discharge status: Home / Self Care | Payer: BC Managed Care – PPO | Source: Ambulatory Visit | Attending: Obstetrics and Gynecology | Admitting: Obstetrics and Gynecology

## 2010-05-16 DIAGNOSIS — D250 Submucous leiomyoma of uterus: Secondary | ICD-10-CM | POA: Insufficient documentation

## 2010-05-16 DIAGNOSIS — N840 Polyp of corpus uteri: Secondary | ICD-10-CM | POA: Insufficient documentation

## 2010-05-21 NOTE — Op Note (Signed)
  Claudia [name], Claudia [name]               ACCOUNT NO.:  [id number]  MEDICAL RECORD NO.:  [id number]           PATIENT TYPE:  O  LOCATION:  [facility]                          [facility]:  [facility]  PHYSICIAN:  Duke Salvia. Marcelle Overlie, M.D.DATE OF BIRTH:  [birth year]  DATE OF PROCEDURE:  05/16/2010 DATE OF DISCHARGE:  05/05/2010                              OPERATIVE REPORT   PREOPERATIVE DIAGNOSIS:  Submucous fibroid/endometrial polyp.  POSTOPERATIVE DIAGNOSIS:  Submucous fibroid/endometrial polyp.  PROCEDURE:  D and C hysteroscopy, resection of submucous fibroid and endometrial polyp.  SURGEON:  Duke Salvia. Marcelle Overlie, MD  ANESTHESIA:  General.  COMPLICATIONS:  None.  DRAINS:  Foley catheter.  BLOOD LOSS:  Minimal.  SPECIMENS REMOVED:  Endometrial polyp and portions of submucous fibroid.  PROCEDURE AND FINDINGS:  The patient was taken to the operating room. After an adequate level of general anesthesia was obtained with the patient's legs in stirrups, the perineum and vagina were prepped and draped with Betadine.  The bladder was drained, EUA carried out.  Uterus was normal size, midposition, adnexa negative.  Cervix was grasped with tenaculum.  Paracervical block created by infiltrating at 3 and 9 o'clock submucosally 5-7 mL of 1% Xylocaine on either side after negative aspiration.  The uterus was sounded to 8 cm, progressively dilated to 27-30 [facility] dilator.  The continuous flow hysteroscope/resectoscope was then inserted.  Findings revealed on the posterior wall was a submucous fibroid and at the right fundal area was a soft tissue polyp.  The VersaPoint loop electrode was then used under continuous flow starting at the posterior fibroid, resecting it down close to the endometrial level.  These chips were removed and sent to pathology.  A large fundal polyp was noted.  This was grasped with a polyp forceps and avulsed and sent separately.  The scope was then reinserted.  The operative sites  were noted to be hemostatic. No other abnormalities were noted.  She tolerated this well, went to recovery room in good condition.     Joshu Furukawa M. Marcelle Overlie, M.D.     RMH/MEDQ  D:  05/16/2010  T:  05/16/2010  Job:  [id number]  Electronically Signed by Richarda Overlie M.D. on 05/21/2010 06:39:19 PM

## 2010-06-05 ENCOUNTER — Ambulatory Visit: Payer: BC Managed Care – PPO | Attending: Radiation Oncology | Admitting: Radiation Oncology

## 2010-06-06 ENCOUNTER — Other Ambulatory Visit: Payer: Self-pay | Admitting: Radiation Oncology

## 2010-06-06 DIAGNOSIS — C3490 Malignant neoplasm of unspecified part of unspecified bronchus or lung: Secondary | ICD-10-CM

## 2010-06-13 LAB — CROSSMATCH
ABO/RH(D): B POS
ABO/RH(D): B POS
Antibody Screen: NEGATIVE
Antibody Screen: NEGATIVE

## 2010-06-13 LAB — ABO/RH: ABO/RH(D): B POS

## 2010-06-16 LAB — COMPREHENSIVE METABOLIC PANEL
ALT: 12 U/L (ref 0–35)
AST: 15 U/L (ref 0–37)
Albumin: 4.2 g/dL (ref 3.5–5.2)
Alkaline Phosphatase: 70 U/L (ref 39–117)
BUN: 6 mg/dL (ref 6–23)
CO2: 32 mEq/L (ref 19–32)
Calcium: 10.5 mg/dL (ref 8.4–10.5)
Chloride: 100 mEq/L (ref 96–112)
Creatinine, Ser: 0.76 mg/dL (ref 0.4–1.2)
GFR calc Af Amer: >60 mL/min (ref >60)
GFR calc non Af Amer: >60 mL/min (ref >60)
Glucose, Bld: 110 mg/dL — ABNORMAL HIGH (ref 70–99)
Potassium: 4.1 mEq/L (ref 3.5–5.1)
Sodium: 137 mEq/L (ref 135–145)
Total Bilirubin: 0.8 mg/dL (ref 0.3–1.2)
Total Protein: 7.2 g/dL (ref 6.0–8.3)

## 2010-06-16 LAB — FUNGUS CULTURE W SMEAR: Fungal Smear: NONE SEEN

## 2010-06-16 LAB — CULTURE, RESPIRATORY W GRAM STAIN: Culture: NO GROWTH

## 2010-06-16 LAB — CBC
HCT: 42.8 % (ref 36.0–46.0)
Hemoglobin: 14.4 g/dL (ref 12.0–15.0)
MCHC: 33.6 g/dL (ref 30.0–36.0)
MCV: 90.4 fL (ref 78.0–100.0)
Platelets: 245 10*3/uL (ref 150–400)
RBC: 4.73 MIL/uL (ref 3.87–5.11)
RDW: 13.4 % (ref 11.5–15.5)
WBC: 7.7 10*3/uL (ref 4.0–10.5)

## 2010-06-16 LAB — AFB CULTURE WITH SMEAR (NOT AT ARMC): Acid Fast Smear: NONE SEEN

## 2010-06-16 LAB — CULTURE, RESPIRATORY

## 2010-06-16 LAB — APTT: aPTT: 34 seconds (ref 24–37)

## 2010-06-16 LAB — PROTIME-INR
INR: 1.04 (ref 0.00–1.49)
Prothrombin Time: 13.5 seconds (ref 11.6–15.2)

## 2010-06-16 LAB — GLUCOSE, CAPILLARY: Glucose-Capillary: 100 mg/dL — ABNORMAL HIGH (ref 70–99)

## 2010-08-04 ENCOUNTER — Inpatient Hospital Stay (HOSPITAL_COMMUNITY)
Admission: RE | Admit: 2010-08-04 | Discharge: 2010-08-04 | Payer: BC Managed Care – PPO | Source: Ambulatory Visit | Attending: Internal Medicine | Admitting: Internal Medicine

## 2010-08-04 ENCOUNTER — Other Ambulatory Visit: Payer: Self-pay | Admitting: Internal Medicine

## 2010-08-04 ENCOUNTER — Encounter (HOSPITAL_BASED_OUTPATIENT_CLINIC_OR_DEPARTMENT_OTHER): Payer: BC Managed Care – PPO | Admitting: Internal Medicine

## 2010-08-04 DIAGNOSIS — C3490 Malignant neoplasm of unspecified part of unspecified bronchus or lung: Secondary | ICD-10-CM

## 2010-08-04 DIAGNOSIS — C3410 Malignant neoplasm of upper lobe, unspecified bronchus or lung: Secondary | ICD-10-CM

## 2010-08-04 LAB — COMPREHENSIVE METABOLIC PANEL
ALT: 8 U/L (ref 0–35)
AST: 14 U/L (ref 0–37)
Albumin: 4.3 g/dL (ref 3.5–5.2)
Alkaline Phosphatase: 84 U/L (ref 39–117)
BUN: 16 mg/dL (ref 6–23)
CO2: 27 mEq/L (ref 19–32)
Calcium: 9.9 mg/dL (ref 8.4–10.5)
Chloride: 104 mEq/L (ref 96–112)
Creatinine, Ser: 0.85 mg/dL (ref 0.40–1.20)
Glucose, Bld: 87 mg/dL (ref 70–99)
Potassium: 4.1 mEq/L (ref 3.5–5.3)
Sodium: 141 mEq/L (ref 135–145)
Total Bilirubin: 0.7 mg/dL (ref 0.3–1.2)
Total Protein: 6.9 g/dL (ref 6.0–8.3)

## 2010-08-04 LAB — CBC WITH DIFFERENTIAL/PLATELET
BASO%: 0.4 % (ref 0.0–2.0)
Basophils Absolute: 0 10*3/uL (ref 0.0–0.1)
EOS%: 1.3 % (ref 0.0–7.0)
Eosinophils Absolute: 0.1 10*3/uL (ref 0.0–0.5)
HCT: 37 % (ref 34.8–46.6)
HGB: 12.6 g/dL (ref 11.6–15.9)
LYMPH%: 11.2 % — ABNORMAL LOW (ref 14.0–49.7)
MCH: 31.8 pg (ref 25.1–34.0)
MCHC: 34.1 g/dL (ref 31.5–36.0)
MCV: 93.2 fL (ref 79.5–101.0)
MONO#: 0.7 10*3/uL (ref 0.1–0.9)
MONO%: 14.8 % — ABNORMAL HIGH (ref 0.0–14.0)
NEUT#: 3.6 10*3/uL (ref 1.5–6.5)
NEUT%: 72.3 % (ref 38.4–76.8)
Platelets: 161 10*3/uL (ref 145–400)
RBC: 3.97 10*6/uL (ref 3.70–5.45)
RDW: 13.8 % (ref 11.2–14.5)
WBC: 5 10*3/uL (ref 3.9–10.3)
lymph#: 0.6 10*3/uL — ABNORMAL LOW (ref 0.9–3.3)

## 2010-08-06 ENCOUNTER — Ambulatory Visit (HOSPITAL_COMMUNITY)
Admission: RE | Admit: 2010-08-06 | Discharge: 2010-08-06 | Disposition: A | Discharge status: Home / Self Care | Payer: BC Managed Care – PPO | Source: Ambulatory Visit | Attending: Internal Medicine | Admitting: Internal Medicine

## 2010-08-06 DIAGNOSIS — J4489 Other specified chronic obstructive pulmonary disease: Secondary | ICD-10-CM | POA: Insufficient documentation

## 2010-08-06 DIAGNOSIS — J449 Chronic obstructive pulmonary disease, unspecified: Secondary | ICD-10-CM | POA: Insufficient documentation

## 2010-08-06 DIAGNOSIS — C3490 Malignant neoplasm of unspecified part of unspecified bronchus or lung: Secondary | ICD-10-CM | POA: Insufficient documentation

## 2010-08-06 DIAGNOSIS — J984 Other disorders of lung: Secondary | ICD-10-CM | POA: Insufficient documentation

## 2010-08-06 MED ORDER — IOHEXOL 300 MG/ML  SOLN
80.0000 mL | Freq: Once | INTRAMUSCULAR | Status: AC | PRN
  Administered 2010-08-06: 80 mL via INTRAVENOUS

## 2010-08-07 ENCOUNTER — Other Ambulatory Visit: Payer: Self-pay | Admitting: Internal Medicine

## 2010-08-07 ENCOUNTER — Encounter (HOSPITAL_BASED_OUTPATIENT_CLINIC_OR_DEPARTMENT_OTHER): Payer: BC Managed Care – PPO | Admitting: Internal Medicine

## 2010-08-07 DIAGNOSIS — C3490 Malignant neoplasm of unspecified part of unspecified bronchus or lung: Secondary | ICD-10-CM

## 2010-08-07 DIAGNOSIS — C3410 Malignant neoplasm of upper lobe, unspecified bronchus or lung: Secondary | ICD-10-CM

## 2010-08-12 ENCOUNTER — Ambulatory Visit (HOSPITAL_COMMUNITY)
Admission: RE | Admit: 2010-08-12 | Discharge: 2010-08-12 | Disposition: A | Discharge status: Home / Self Care | Payer: BC Managed Care – PPO | Source: Ambulatory Visit | Attending: Obstetrics and Gynecology | Admitting: Obstetrics and Gynecology

## 2010-08-12 ENCOUNTER — Other Ambulatory Visit (HOSPITAL_COMMUNITY): Payer: Self-pay | Admitting: Obstetrics and Gynecology

## 2010-08-12 DIAGNOSIS — K651 Peritoneal abscess: Secondary | ICD-10-CM

## 2010-08-12 DIAGNOSIS — R9389 Abnormal findings on diagnostic imaging of other specified body structures: Secondary | ICD-10-CM

## 2010-08-12 DIAGNOSIS — C3490 Malignant neoplasm of unspecified part of unspecified bronchus or lung: Secondary | ICD-10-CM | POA: Insufficient documentation

## 2010-08-12 NOTE — Letter (Signed)
Aug 27, 2009   Carola J. Gerri Spore, MD  [address]  [address]  [place]  [place] [address]   Re:  THURSA, EMME                 DOB:  January 04, 1946   Dear Dr. Gerri Spore:   I appreciate the opportunity of seeing the patient, this 65 year old  Caucasian female who is a long-time smoker and just recently quit  smoking.  She has had some weight loss and had a chest x-ray, then a CT  scan of the chest and the abdomen.  The chest x-ray showed a left upper  lobe, left hilar lesion that was at least 3.4 cm in size and this  probably showed left hilar adenopathy.  She has had no hemoptysis,  fever, chills, or excessive sputum.  Her abdominal CT showed just a  couple low-density lesions in the liver.   MEDICATIONS:  Include alprazolam 0.25 mg daily.   PAST MEDICAL HISTORY:  Significant.   PAST SURGICAL HISTORY:  She has no other surgeries.   FAMILY HISTORY:  Noncontributory.   SOCIAL HISTORY:  Married, has one child.  She works in [employer] as a Solicitor.  She has just quit smoking.  She does not drink alcohol on a regular  basis.   REVIEW OF SYSTEMS:  GENERAL:  She is 5 feet 2.  She has had weight loss  and loss of appetite.  CARDIAC:  No angina or atrial fibrillation.  PULMONARY:  No hemoptysis, fever, chills, or excessive sputum.  GASTROINTESTINAL:  Constipation.  GENITOURINARY:  No kidney disease, dysuria, or frequent urination.  VASCULAR:  No claudication, DVT, or TIAs.  NEUROLOGICAL:  No dizziness headaches, blackouts, or seizures.  MUSCULOSKELETAL:  Arthritis.  PSYCHIATRIC:  No depression or nervous.  EYE/ENT:  No change in eyesight or hearing.  NEUROLOGICAL:  No problems with bleeding, clotting disorders, or anemia.   PHYSICAL EXAMINATION:  GENERAL:  She is a well-developed Caucasian  female, in no acute distress.  VITAL SIGNS:  Blood pressure is 140/80, respirations 18, pulse is 70 and  saturations were 94%.  HEAD, EYES, EARS, NOSE AND THROAT:  Unremarkable.  NECK:   Supple without thyromegaly.  CHEST:  Clear to auscultation and percussion.  HEART:  Regular sinus rhythm.  No murmurs.  ABDOMEN:  Soft.  No hepatosplenomegaly.  EXTREMITIES:  Pulses are 2+.  There is no clubbing or edema.  NEUROLOGICAL:  She is oriented x3.  Sensory and motor intact.  Cranial  nerves intact.   Unfortunately, I think, the patient has a stage IIIA non-small cell lung  cancer.  I am going to ahead and get a brain scan, PET scan, pulmonary  function tests with PFTs and then we will do a bronchoscopy with  endobronchial ultrasound on her on August 30, 2009, so I will gone next  week.  We will hopefully can get everything done, so that I give her the  results when I come back and get her under appropriate treatment.  I  appreciate the opportunity of seeing Claudia [name].   Ines Bloomer, M.D.  Electronically Signed   DPB/MEDQ  D:  08/27/2009  T:  08/28/2009  Job:  [id number]

## 2010-09-08 ENCOUNTER — Ambulatory Visit
Admission: RE | Admit: 2010-09-08 | Discharge: 2010-09-08 | Disposition: A | Discharge status: Home / Self Care | Payer: BC Managed Care – PPO | Source: Ambulatory Visit | Attending: Radiation Oncology | Admitting: Radiation Oncology

## 2010-09-08 DIAGNOSIS — C3490 Malignant neoplasm of unspecified part of unspecified bronchus or lung: Secondary | ICD-10-CM

## 2010-09-08 MED ORDER — GADOBENATE DIMEGLUMINE 529 MG/ML IV SOLN
10.0000 mL | Freq: Once | INTRAVENOUS | Status: AC | PRN
  Administered 2010-09-08: 10 mL via INTRAVENOUS

## 2010-09-11 ENCOUNTER — Ambulatory Visit
Admission: RE | Admit: 2010-09-11 | Discharge: 2010-09-11 | Disposition: A | Discharge status: Home / Self Care | Payer: BC Managed Care – PPO | Source: Ambulatory Visit | Attending: Radiation Oncology | Admitting: Radiation Oncology

## 2010-11-03 ENCOUNTER — Other Ambulatory Visit: Payer: Self-pay | Admitting: Internal Medicine

## 2010-11-03 ENCOUNTER — Ambulatory Visit (HOSPITAL_COMMUNITY)
Admission: RE | Admit: 2010-11-03 | Discharge: 2010-11-03 | Disposition: A | Discharge status: Home / Self Care | Payer: BC Managed Care – PPO | Source: Ambulatory Visit | Attending: Internal Medicine | Admitting: Internal Medicine

## 2010-11-03 ENCOUNTER — Encounter (HOSPITAL_BASED_OUTPATIENT_CLINIC_OR_DEPARTMENT_OTHER): Payer: BC Managed Care – PPO | Admitting: Internal Medicine

## 2010-11-03 ENCOUNTER — Encounter (HOSPITAL_COMMUNITY): Payer: Self-pay

## 2010-11-03 ENCOUNTER — Emergency Department (HOSPITAL_COMMUNITY)
Admission: EM | Admit: 2010-11-03 | Discharge: 2010-11-03 | Disposition: A | Discharge status: Home / Self Care | Payer: BC Managed Care – PPO | Attending: Emergency Medicine | Admitting: Emergency Medicine

## 2010-11-03 DIAGNOSIS — I319 Disease of pericardium, unspecified: Secondary | ICD-10-CM | POA: Insufficient documentation

## 2010-11-03 DIAGNOSIS — C3490 Malignant neoplasm of unspecified part of unspecified bronchus or lung: Secondary | ICD-10-CM | POA: Insufficient documentation

## 2010-11-03 DIAGNOSIS — R935 Abnormal findings on diagnostic imaging of other abdominal regions, including retroperitoneum: Secondary | ICD-10-CM | POA: Insufficient documentation

## 2010-11-03 DIAGNOSIS — J984 Other disorders of lung: Secondary | ICD-10-CM | POA: Insufficient documentation

## 2010-11-03 DIAGNOSIS — C3410 Malignant neoplasm of upper lobe, unspecified bronchus or lung: Secondary | ICD-10-CM

## 2010-11-03 DIAGNOSIS — R918 Other nonspecific abnormal finding of lung field: Secondary | ICD-10-CM | POA: Insufficient documentation

## 2010-11-03 DIAGNOSIS — K661 Hemoperitoneum: Secondary | ICD-10-CM

## 2010-11-03 LAB — PROTIME-INR
INR: 1.03 (ref 0.00–1.49)
Prothrombin Time: 13.7 seconds (ref 11.6–15.2)

## 2010-11-03 LAB — URINALYSIS, ROUTINE W REFLEX MICROSCOPIC
Bilirubin Urine: NEGATIVE
Glucose, UA: NEGATIVE mg/dL
Hgb urine dipstick: NEGATIVE
Ketones, ur: NEGATIVE mg/dL
Leukocytes, UA: NEGATIVE
Nitrite: NEGATIVE
Protein, ur: NEGATIVE mg/dL
Specific Gravity, Urine: 1.016 (ref 1.005–1.030)
Urobilinogen, UA: 0.2 mg/dL (ref 0.0–1.0)
pH: 6.5 (ref 5.0–8.0)

## 2010-11-03 LAB — CMP (CANCER CENTER ONLY)
ALT(SGPT): 13 U/L (ref 10–47)
AST: 20 U/L (ref 11–38)
Albumin: 3.6 g/dL (ref 3.3–5.5)
Alkaline Phosphatase: 82 U/L (ref 26–84)
BUN, Bld: 13 mg/dL (ref 7–22)
CO2: 30 mEq/L (ref 18–33)
Calcium: 9.5 mg/dL (ref 8.0–10.3)
Chloride: 98 mEq/L (ref 98–108)
Creat: 0.9 mg/dl (ref 0.6–1.2)
Glucose, Bld: 92 mg/dL (ref 73–118)
Potassium: 4.3 mEq/L (ref 3.3–4.7)
Sodium: 141 mEq/L (ref 128–145)
Total Bilirubin: 0.7 mg/dl (ref 0.20–1.60)
Total Protein: 7 g/dL (ref 6.4–8.1)

## 2010-11-03 LAB — CBC WITH DIFFERENTIAL/PLATELET
BASO%: 0.8 % (ref 0.0–2.0)
Basophils Absolute: 0 10*3/uL (ref 0.0–0.1)
EOS%: 1.5 % (ref 0.0–7.0)
Eosinophils Absolute: 0.1 10*3/uL (ref 0.0–0.5)
HCT: 36.9 % (ref 34.8–46.6)
HGB: 12.8 g/dL (ref 11.6–15.9)
LYMPH%: 14.8 % (ref 14.0–49.7)
MCH: 32.7 pg (ref 25.1–34.0)
MCHC: 34.7 g/dL (ref 31.5–36.0)
MCV: 94 fL (ref 79.5–101.0)
MONO#: 0.7 10*3/uL (ref 0.1–0.9)
MONO%: 16 % — ABNORMAL HIGH (ref 0.0–14.0)
NEUT#: 2.8 10*3/uL (ref 1.5–6.5)
NEUT%: 66.9 % (ref 38.4–76.8)
Platelets: 175 10*3/uL (ref 145–400)
RBC: 3.92 10*6/uL (ref 3.70–5.45)
RDW: 13.5 % (ref 11.2–14.5)
WBC: 4.1 10*3/uL (ref 3.9–10.3)
lymph#: 0.6 10*3/uL — ABNORMAL LOW (ref 0.9–3.3)

## 2010-11-03 LAB — CBC
HCT: 39.6 % (ref 36.0–46.0)
Hemoglobin: 13 g/dL (ref 12.0–15.0)
MCH: 30.4 pg (ref 26.0–34.0)
MCHC: 32.8 g/dL (ref 30.0–36.0)
MCV: 92.5 fL (ref 78.0–100.0)
Platelets: 180 10*3/uL (ref 150–400)
RBC: 4.28 MIL/uL (ref 3.87–5.11)
RDW: 13.1 % (ref 11.5–15.5)
WBC: 6.3 10*3/uL (ref 4.0–10.5)

## 2010-11-03 LAB — COMPREHENSIVE METABOLIC PANEL
ALT: 12 U/L (ref 0–35)
AST: 16 U/L (ref 0–37)
Albumin: 4.3 g/dL (ref 3.5–5.2)
Alkaline Phosphatase: 88 U/L (ref 39–117)
BUN: 13 mg/dL (ref 6–23)
CO2: 28 mEq/L (ref 19–32)
Calcium: 10.5 mg/dL (ref 8.4–10.5)
Chloride: 102 mEq/L (ref 96–112)
Creatinine, Ser: 0.73 mg/dL (ref 0.50–1.10)
GFR calc Af Amer: >60 mL/min (ref >60)
GFR calc non Af Amer: >60 mL/min (ref >60)
Glucose, Bld: 90 mg/dL (ref 70–99)
Potassium: 3.8 mEq/L (ref 3.5–5.1)
Sodium: 138 mEq/L (ref 135–145)
Total Bilirubin: 0.5 mg/dL (ref 0.3–1.2)
Total Protein: 7.8 g/dL (ref 6.0–8.3)

## 2010-11-03 LAB — DIFFERENTIAL
Basophils Absolute: 0 10*3/uL (ref 0.0–0.1)
Basophils Relative: 0 % (ref 0–1)
Eosinophils Absolute: 0.1 10*3/uL (ref 0.0–0.7)
Eosinophils Relative: 1 % (ref 0–5)
Lymphocytes Relative: 17 % (ref 12–46)
Lymphs Abs: 1.1 10*3/uL (ref 0.7–4.0)
Monocytes Absolute: 0.6 10*3/uL (ref 0.1–1.0)
Monocytes Relative: 10 % (ref 3–12)
Neutro Abs: 4.5 10*3/uL (ref 1.7–7.7)
Neutrophils Relative %: 71 % (ref 43–77)

## 2010-11-03 LAB — SAMPLE TO BLOOD BANK

## 2010-11-03 MED ORDER — IOHEXOL 300 MG/ML  SOLN
80.0000 mL | Freq: Once | INTRAMUSCULAR | Status: AC | PRN
  Administered 2010-11-03: 80 mL via INTRAVENOUS

## 2010-11-06 ENCOUNTER — Encounter (HOSPITAL_BASED_OUTPATIENT_CLINIC_OR_DEPARTMENT_OTHER): Payer: BC Managed Care – PPO | Admitting: Internal Medicine

## 2010-11-06 ENCOUNTER — Other Ambulatory Visit: Payer: Self-pay | Admitting: Internal Medicine

## 2010-11-06 DIAGNOSIS — C3490 Malignant neoplasm of unspecified part of unspecified bronchus or lung: Secondary | ICD-10-CM

## 2010-11-06 DIAGNOSIS — C3410 Malignant neoplasm of upper lobe, unspecified bronchus or lung: Secondary | ICD-10-CM

## 2010-11-08 NOTE — Consult Note (Signed)
Claudia [name], Claudia [name] NO.:  [id number]  MEDICAL RECORD NO.:  [id number]  LOCATION:                                 FACILITY:  PHYSICIAN:  Lodema Pilot, MD       DATE OF BIRTH:  10-Dec-1945  DATE OF CONSULTATION:  11/03/2010 DATE OF DISCHARGE:                                CONSULTATION   REFERRING PHYSICIAN:  Markham Jordan L. Effie Shy, MD  REASON FOR CONSULTATION:  Evaluate for free air.  HISTORY OF PRESENT ILLNESS:  Claudia [name] is a 65 year old female with a history of lung cancer who has previously been treated with chemotherapy and radiation, who was feeling well at her normal state of health. She is followed for lung cancer with 3 months surveillance CT scans of the chest.  She had a CT scan of the chest today for routine followup which demonstrated free air in her abdomen.  CT scan of the abdomen was then completed to further evaluate and there was no other evidence of any intra-abdominal process found.  She was referred to the emergency room for evaluation and concern for free air.  She had a similar episode in March earlier this year after her last CT scan.  She was found to have free air as well on that scan and was evaluated at that time and was discharged home without any intervention because she was feeling well. She has again no complaints today.  She denies any abdominal pain.  She denies any fever or chills and states that her bowels have been normal. Her only abnormality is some increase in "gassiness" with bowel movements, but this is rectal gas and not abdominal distention.  She states that her abdomen is normal for her.  She has been tolerating diet without any new problems and moving her bowels and she denies any blood in the stools or melena.  PAST MEDICAL HISTORY:  History of lung cancer and skin cancer.  PAST SURGICAL HISTORY:  D and C.  SOCIAL HISTORY:  She has a history of smoking, but she says she quit "a few years ago."  She denies any  alcohol use.  MEDICATIONS:  Please refer to ER medication sheet.  REVIEW OF SYSTEMS:  Otherwise is not contributory.  She has no complaints today.  PHYSICAL EXAMINATION:  GENERAL:  She is in no acute distress, nontoxic appearing, sitting comfortably in the bed. VITAL SIGNS:  She is afebrile, temperature 97.8, heart rate is 64, blood pressure 134/66, respirations 18.  She is saturating well on room air. HEENT:  Head is normocephalic and atraumatic.  Her sclerae are white. Conjunctivae normal.  Her trachea is midline and no stridor.  LUNGS: Clear to auscultation bilaterally without wheezes or respiratory distress.  No increased work of breathing. CARDIAC:  Her heart rate is normal rate and rhythm. ABDOMEN:  Soft, nontender on exam.  There are no masses and there is no distention.  There are no peritoneal signs and again no tenderness, no hernias. SKIN:  Warm and dry without any rashes or lesions. EXTREMITIES:  Have full range of motion with normal strength and pulses are palpable in all 4 extremities.  LABORATORY STUDIES:  Demonstrate a white count of 6.3, hemoglobin 13.0, hematocrit of 39.6, platelets of 180, neutrophils 71%.  CT scan of the chest and abdomen demonstrate free air and multiple small pockets of free air through the upper abdomen, but no other evidence of inflammatory process or intra-abdominal process.  ASSESSMENT:  History of lung cancer which with free air found on routine screening examination.  No evidence of acute abdominal process.  She is clinically well and denies any complaints today.  I am uncertain as to what could be the cause of this, but I doubt that this is preferred from a perforated discus given her clinical exam, laboratory studies, and imaging.  PLAN:  I have discussed with her the findings and certainly the unusual finding of free air given the overall clinical picture.  I explained that this is typically from a perforated viscus and certainly  does raise some concern, although she is without complaint, feels totally normal. I offered admission for overnight observation and repeat abdominal exams, but given her overall appearance I would not recommend surgical intervention at this time.  I doubt that this is due to a perforated viscus.  She had similar finding previously a few months ago on her CT scan and again she is clinically doing well without any physical exam findings concerning for intra-abdominal process.  She declined admission and would like to go home which again I think would be reasonable. Given her overall picture, I recommended that if she does develop any signs of abdominal pain, fever, nausea, chills, or any other symptoms, she will return to the emergency room as soon as possible for repeat evaluation and she agreed to this.          ______________________________ Lodema Pilot, MD     BL/MEDQ  D:  11/03/2010  T:  11/04/2010  Job:  [id number]  Electronically Signed by Lodema Pilot DO on 11/08/2010 06:02:25 PM

## 2011-01-22 ENCOUNTER — Ambulatory Visit
Admission: RE | Admit: 2011-01-22 | Discharge: 2011-01-22 | Disposition: A | Discharge status: Home / Self Care | Payer: Medicare Other | Source: Ambulatory Visit | Attending: Radiation Oncology | Admitting: Radiation Oncology

## 2011-02-12 ENCOUNTER — Telehealth: Payer: Self-pay | Admitting: Internal Medicine

## 2011-02-12 NOTE — Telephone Encounter (Signed)
Lvm advising 12/10 appt has been cx'd due to Epic. Advised pt in vm to keep all other scheduled appts.

## 2011-03-01 ENCOUNTER — Telehealth: Payer: Self-pay | Admitting: Internal Medicine

## 2011-03-01 NOTE — Telephone Encounter (Signed)
S/w pt today re new appt for 1/10 @ 9:30 am. R/s from dec due to EPIC.

## 2011-03-06 ENCOUNTER — Telehealth: Payer: Self-pay | Admitting: *Deleted

## 2011-03-06 ENCOUNTER — Other Ambulatory Visit (HOSPITAL_BASED_OUTPATIENT_CLINIC_OR_DEPARTMENT_OTHER): Payer: Medicare Other | Admitting: Lab

## 2011-03-06 ENCOUNTER — Other Ambulatory Visit: Payer: Self-pay | Admitting: Internal Medicine

## 2011-03-06 ENCOUNTER — Encounter (HOSPITAL_COMMUNITY): Payer: Self-pay

## 2011-03-06 ENCOUNTER — Ambulatory Visit (HOSPITAL_COMMUNITY)
Admission: RE | Admit: 2011-03-06 | Discharge: 2011-03-06 | Disposition: A | Discharge status: Home / Self Care | Payer: Medicare Other | Source: Ambulatory Visit | Attending: Internal Medicine | Admitting: Internal Medicine

## 2011-03-06 DIAGNOSIS — J438 Other emphysema: Secondary | ICD-10-CM | POA: Insufficient documentation

## 2011-03-06 DIAGNOSIS — C3490 Malignant neoplasm of unspecified part of unspecified bronchus or lung: Secondary | ICD-10-CM

## 2011-03-06 DIAGNOSIS — I319 Disease of pericardium, unspecified: Secondary | ICD-10-CM | POA: Insufficient documentation

## 2011-03-06 DIAGNOSIS — D1803 Hemangioma of intra-abdominal structures: Secondary | ICD-10-CM | POA: Insufficient documentation

## 2011-03-06 DIAGNOSIS — Z9221 Personal history of antineoplastic chemotherapy: Secondary | ICD-10-CM | POA: Insufficient documentation

## 2011-03-06 DIAGNOSIS — C3410 Malignant neoplasm of upper lobe, unspecified bronchus or lung: Secondary | ICD-10-CM

## 2011-03-06 DIAGNOSIS — N289 Disorder of kidney and ureter, unspecified: Secondary | ICD-10-CM | POA: Insufficient documentation

## 2011-03-06 DIAGNOSIS — K228 Other specified diseases of esophagus: Secondary | ICD-10-CM | POA: Insufficient documentation

## 2011-03-06 DIAGNOSIS — K2289 Other specified disease of esophagus: Secondary | ICD-10-CM | POA: Insufficient documentation

## 2011-03-06 DIAGNOSIS — Z923 Personal history of irradiation: Secondary | ICD-10-CM | POA: Insufficient documentation

## 2011-03-06 LAB — CBC WITH DIFFERENTIAL/PLATELET
BASO%: 0.8 % (ref 0.0–2.0)
Basophils Absolute: 0 10*3/uL (ref 0.0–0.1)
EOS%: 2.2 % (ref 0.0–7.0)
Eosinophils Absolute: 0.1 10*3/uL (ref 0.0–0.5)
HCT: 38 % (ref 34.8–46.6)
HGB: 12.8 g/dL (ref 11.6–15.9)
LYMPH%: 9.6 % — ABNORMAL LOW (ref 14.0–49.7)
MCH: 31.3 pg (ref 25.1–34.0)
MCHC: 33.5 g/dL (ref 31.5–36.0)
MCV: 93.5 fL (ref 79.5–101.0)
MONO#: 0.6 10*3/uL (ref 0.1–0.9)
MONO%: 12.2 % (ref 0.0–14.0)
NEUT#: 4 10*3/uL (ref 1.5–6.5)
NEUT%: 75.2 % (ref 38.4–76.8)
Platelets: 166 10*3/uL (ref 145–400)
RBC: 4.07 10*6/uL (ref 3.70–5.45)
RDW: 13.8 % (ref 11.2–14.5)
WBC: 5.3 10*3/uL (ref 3.9–10.3)
lymph#: 0.5 10*3/uL — ABNORMAL LOW (ref 0.9–3.3)

## 2011-03-06 LAB — CMP (CANCER CENTER ONLY)
ALT(SGPT): 15 U/L (ref 10–47)
AST: 22 U/L (ref 11–38)
Albumin: 3.8 g/dL (ref 3.3–5.5)
Alkaline Phosphatase: 80 U/L (ref 26–84)
BUN, Bld: 12 mg/dL (ref 7–22)
CO2: 31 mEq/L (ref 18–33)
Calcium: 9.3 mg/dL (ref 8.0–10.3)
Chloride: 103 mEq/L (ref 98–108)
Creat: 1 mg/dl (ref 0.6–1.2)
Glucose, Bld: 89 mg/dL (ref 73–118)
Potassium: 4.5 mEq/L (ref 3.3–4.7)
Sodium: 143 mEq/L (ref 128–145)
Total Bilirubin: 0.7 mg/dl (ref 0.20–1.60)
Total Protein: 7.5 g/dL (ref 6.4–8.1)

## 2011-03-06 MED ORDER — IOHEXOL 300 MG/ML  SOLN
80.0000 mL | Freq: Once | INTRAMUSCULAR | Status: AC | PRN
  Administered 2011-03-06: 80 mL via INTRAVENOUS

## 2011-03-06 NOTE — Telephone Encounter (Signed)
FAXED CT CHEST REPORT GIVEN TO ADRENA JOHNSON,PA.

## 2011-04-06 ENCOUNTER — Other Ambulatory Visit: Payer: Self-pay | Admitting: Family Medicine

## 2011-04-06 ENCOUNTER — Ambulatory Visit
Admission: RE | Admit: 2011-04-06 | Discharge: 2011-04-06 | Disposition: A | Discharge status: Home / Self Care | Payer: Medicare Other | Source: Ambulatory Visit | Attending: Family Medicine | Admitting: Family Medicine

## 2011-04-06 DIAGNOSIS — J189 Pneumonia, unspecified organism: Secondary | ICD-10-CM

## 2011-04-09 ENCOUNTER — Ambulatory Visit (HOSPITAL_BASED_OUTPATIENT_CLINIC_OR_DEPARTMENT_OTHER): Payer: Medicare Other | Admitting: Internal Medicine

## 2011-04-09 ENCOUNTER — Other Ambulatory Visit: Payer: Self-pay

## 2011-04-09 ENCOUNTER — Emergency Department (HOSPITAL_COMMUNITY): Payer: Medicare Other

## 2011-04-09 ENCOUNTER — Telehealth: Payer: Self-pay | Admitting: Internal Medicine

## 2011-04-09 ENCOUNTER — Ambulatory Visit: Payer: Medicare Other | Admitting: Internal Medicine

## 2011-04-09 ENCOUNTER — Inpatient Hospital Stay (HOSPITAL_COMMUNITY)
Admission: EM | Admit: 2011-04-09 | Discharge: 2011-04-14 | DRG: 190 | Disposition: A | Discharge status: Home / Self Care | Payer: Medicare Other | Attending: Internal Medicine | Admitting: Internal Medicine

## 2011-04-09 ENCOUNTER — Encounter (HOSPITAL_COMMUNITY): Payer: Self-pay | Admitting: Emergency Medicine

## 2011-04-09 VITALS — BP 105/69 | HR 124 | Temp 98.4°F | Wt 127.6 lb

## 2011-04-09 DIAGNOSIS — C3490 Malignant neoplasm of unspecified part of unspecified bronchus or lung: Secondary | ICD-10-CM

## 2011-04-09 DIAGNOSIS — D638 Anemia in other chronic diseases classified elsewhere: Secondary | ICD-10-CM | POA: Diagnosis present

## 2011-04-09 DIAGNOSIS — R778 Other specified abnormalities of plasma proteins: Secondary | ICD-10-CM

## 2011-04-09 DIAGNOSIS — E871 Hypo-osmolality and hyponatremia: Secondary | ICD-10-CM | POA: Diagnosis present

## 2011-04-09 DIAGNOSIS — R918 Other nonspecific abnormal finding of lung field: Secondary | ICD-10-CM | POA: Diagnosis present

## 2011-04-09 DIAGNOSIS — R9389 Abnormal findings on diagnostic imaging of other specified body structures: Secondary | ICD-10-CM | POA: Diagnosis present

## 2011-04-09 DIAGNOSIS — Z9221 Personal history of antineoplastic chemotherapy: Secondary | ICD-10-CM

## 2011-04-09 DIAGNOSIS — J441 Chronic obstructive pulmonary disease with (acute) exacerbation: Principal | ICD-10-CM | POA: Diagnosis present

## 2011-04-09 DIAGNOSIS — I519 Heart disease, unspecified: Secondary | ICD-10-CM | POA: Diagnosis present

## 2011-04-09 DIAGNOSIS — Z923 Personal history of irradiation: Secondary | ICD-10-CM

## 2011-04-09 DIAGNOSIS — I498 Other specified cardiac arrhythmias: Secondary | ICD-10-CM | POA: Diagnosis present

## 2011-04-09 DIAGNOSIS — R7989 Other specified abnormal findings of blood chemistry: Secondary | ICD-10-CM | POA: Diagnosis present

## 2011-04-09 DIAGNOSIS — D649 Anemia, unspecified: Secondary | ICD-10-CM | POA: Diagnosis present

## 2011-04-09 DIAGNOSIS — Z66 Do not resuscitate: Secondary | ICD-10-CM | POA: Diagnosis present

## 2011-04-09 DIAGNOSIS — J449 Chronic obstructive pulmonary disease, unspecified: Secondary | ICD-10-CM | POA: Diagnosis present

## 2011-04-09 DIAGNOSIS — R748 Abnormal levels of other serum enzymes: Secondary | ICD-10-CM | POA: Diagnosis present

## 2011-04-09 DIAGNOSIS — D72829 Elevated white blood cell count, unspecified: Secondary | ICD-10-CM | POA: Diagnosis present

## 2011-04-09 DIAGNOSIS — T380X5A Adverse effect of glucocorticoids and synthetic analogues, initial encounter: Secondary | ICD-10-CM | POA: Diagnosis present

## 2011-04-09 DIAGNOSIS — R21 Rash and other nonspecific skin eruption: Secondary | ICD-10-CM | POA: Diagnosis present

## 2011-04-09 DIAGNOSIS — J9600 Acute respiratory failure, unspecified whether with hypoxia or hypercapnia: Secondary | ICD-10-CM | POA: Diagnosis present

## 2011-04-09 DIAGNOSIS — Z87891 Personal history of nicotine dependence: Secondary | ICD-10-CM

## 2011-04-09 DIAGNOSIS — J189 Pneumonia, unspecified organism: Secondary | ICD-10-CM | POA: Clinically undetermined

## 2011-04-09 HISTORY — DX: Chronic obstructive pulmonary disease, unspecified: J44.9

## 2011-04-09 HISTORY — DX: Malignant neoplasm of unspecified part of unspecified bronchus or lung: C34.90

## 2011-04-09 HISTORY — DX: Anemia, unspecified: D64.9

## 2011-04-09 LAB — POCT I-STAT TROPONIN I: Troponin i, poc: 0.32 ng/mL (ref 0.00–0.08)

## 2011-04-09 LAB — PRO B NATRIURETIC PEPTIDE: Pro B Natriuretic peptide (BNP): 13257 pg/mL — ABNORMAL HIGH (ref 0–125)

## 2011-04-09 LAB — CBC
HCT: 32.3 % — ABNORMAL LOW (ref 36.0–46.0)
Hemoglobin: 11 g/dL — ABNORMAL LOW (ref 12.0–15.0)
MCH: 30.3 pg (ref 26.0–34.0)
MCHC: 34.1 g/dL (ref 30.0–36.0)
MCV: 89 fL (ref 78.0–100.0)
Platelets: 276 10*3/uL (ref 150–400)
RBC: 3.63 MIL/uL — ABNORMAL LOW (ref 3.87–5.11)
RDW: 13.4 % (ref 11.5–15.5)
WBC: 20.9 10*3/uL — ABNORMAL HIGH (ref 4.0–10.5)

## 2011-04-09 LAB — BASIC METABOLIC PANEL
BUN: 33 mg/dL — ABNORMAL HIGH (ref 6–23)
CO2: 28 mEq/L (ref 19–32)
Calcium: 10.9 mg/dL — ABNORMAL HIGH (ref 8.4–10.5)
Chloride: 93 mEq/L — ABNORMAL LOW (ref 96–112)
Creatinine, Ser: 0.91 mg/dL (ref 0.50–1.10)
GFR calc Af Amer: 75 mL/min — ABNORMAL LOW (ref >90)
GFR calc non Af Amer: 65 mL/min — ABNORMAL LOW (ref >90)
Glucose, Bld: 142 mg/dL — ABNORMAL HIGH (ref 70–99)
Potassium: 4.1 mEq/L (ref 3.5–5.1)
Sodium: 131 mEq/L — ABNORMAL LOW (ref 135–145)

## 2011-04-09 LAB — LACTIC ACID, PLASMA: Lactic Acid, Venous: 1.5 mmol/L (ref 0.5–2.2)

## 2011-04-09 LAB — PROCALCITONIN: Procalcitonin: 4.75 ng/mL

## 2011-04-09 MED ORDER — METHYLPREDNISOLONE SODIUM SUCC 125 MG IJ SOLR
125.0000 mg | Freq: Once | INTRAMUSCULAR | Status: AC
  Administered 2011-04-09: 125 mg via INTRAVENOUS
  Filled 2011-04-09: qty 2

## 2011-04-09 MED ORDER — GUAIFENESIN-DM 100-10 MG/5ML PO SYRP
5.0000 mL | ORAL_SOLUTION | ORAL | Status: DC | PRN
  Filled 2011-04-09: qty 10

## 2011-04-09 MED ORDER — ALBUTEROL SULFATE (5 MG/ML) 0.5% IN NEBU
5.0000 mg | INHALATION_SOLUTION | Freq: Once | RESPIRATORY_TRACT | Status: AC
  Administered 2011-04-09: 5 mg via RESPIRATORY_TRACT
  Filled 2011-04-09: qty 1

## 2011-04-09 MED ORDER — METHYLPREDNISOLONE 4 MG PO KIT: PACK | ORAL | Status: DC

## 2011-04-09 MED ORDER — SODIUM CHLORIDE 0.9 % IV BOLUS (SEPSIS)
500.0000 mL | Freq: Once | INTRAVENOUS | Status: AC
  Administered 2011-04-09: 500 mL via INTRAVENOUS

## 2011-04-09 MED ORDER — MOXIFLOXACIN HCL IN NACL 400 MG/250ML IV SOLN
400.0000 mg | INTRAVENOUS | Status: DC
  Administered 2011-04-09: 400 mg via INTRAVENOUS

## 2011-04-09 MED ORDER — MOXIFLOXACIN HCL IN NACL 400 MG/250ML IV SOLN
400.0000 mg | Freq: Once | INTRAVENOUS | Status: AC
  Administered 2011-04-09: 400 mg via INTRAVENOUS
  Filled 2011-04-09: qty 250

## 2011-04-09 MED ORDER — IPRATROPIUM BROMIDE 0.02 % IN SOLN
0.5000 mg | Freq: Once | RESPIRATORY_TRACT | Status: AC
  Administered 2011-04-09: 0.5 mg via RESPIRATORY_TRACT
  Filled 2011-04-09: qty 2.5

## 2011-04-09 NOTE — H&P (Addendum)
PCP:   Clyde Canterbury at [facility]: Dr. Arbutus Ped   Chief Complaint:  Short of breath, wheezy, rash  HPI: Claudia [name] is a 66 y.o. female   has a past medical history of Lung cancer (2010); COPD (chronic obstructive pulmonary disease); and Anemia.   Presented with  1 week history of increased coughing, sore throat. On Monday was seen by PCP and was given cough medicine and steroid taper. She had no improvement. She have never used inhalers or nebulizer in the past. Not sure if she had any fever. Have had increased wheezing. No chest pain. The shortness of breath started yesterday and became severe rapidly. No leg swelling. She did have a recent trip to the beach.   She took a bath yesterday and noted the rash on her abdomen.   Review of Systems:    Pertinent positives include: Fevers,fatigue. nasal congestion, post nasal drip, shortness of breath at rest. Felt wobly on her feet, rash   Constitutional:  No weight loss, night sweats,  chills,  HEENT:  No headaches, Difficulty swallowing,Tooth/dental problems,Sore throat,  No sneezing, itching, ear ache,  Cardio-vascular:  No chest pain, Orthopnea, PND, anasarca, dizziness, palpitations.no Bilateral lower extremity swelling  GI:  No heartburn, indigestion, abdominal pain, nausea, vomiting, diarrhea, change in bowel habits, loss of appetite  Resp:  no  No dyspnea on exertion, No excess mucus, no productive cough, No non-productive cough, No coughing up of blood.No change in color of mucus.No wheezing.No chest wall deformity  Skin:  no rash or lesions.  GU:  no dysuria, change in color of urine, no urgency or frequency. No flank pain.  Musculoskeletal:  No joint pain or swelling. No decreased range of motion. No back pain.  Psych:  No change in mood or affect. No depression or anxiety. No memory loss.  Neuro: No localized weakness, no syncope  Otherwise ROS are negative except for above, 10 systems were  reviewed  Past Medical History: Past Medical History  Diagnosis Date  . Lung cancer 2010    Dr. Arbutus Ped, finished chemo, sp radiation  . COPD (chronic obstructive pulmonary disease)   . Anemia    Past Surgical History  Procedure Date  . Neck surgery      Medications: Prior to Admission medications   Medication Sig Start Date End Date Taking? Authorizing Provider  diphenhydrAMINE (BENADRYL) 25 MG tablet Take 25 mg by mouth every 6 (six) hours as needed. Allergic reaction   Yes Historical Provider, MD  fish oil-omega-3 fatty acids 1000 MG capsule Take 1 g by mouth daily.   Yes Historical Provider, MD  methylPREDNISolone (MEDROL DOSEPAK) 4 MG tablet Take by mouth. follow package directions 04/09/11 04/16/11 Yes Mohamed K. Arbutus Ped, MD    Allergies:   Allergies  Allergen Reactions  . Azithromycin Shortness Of Breath and Rash    Took Tussionex at same time Jan 2013.  . Tussionex Pennkinetic Er (Hydrocod Polst-Cpm Polst) Shortness Of Breath and Rash    Took Azithromycin at same time Jan 2013    Social History:  Ambulatory independently Lives at home with husband   reports that she quit smoking about 3 years ago. She does not have any smokeless tobacco history on file. She reports that she drinks alcohol. She reports that she does not use illicit drugs.   Family History: family history includes Cancer in her mother and Diabetes in her son.    Physical Exam: Patient Vitals for the past 24 hrs:  BP Temp  Temp src Pulse Resp SpO2 Height Weight  04/09/11 2130 117/71 mmHg - - - 25  - - -  04/09/11 2100 119/64 mmHg - - - 24  - - -  04/09/11 2045 - - - 112  25  100 % - -  04/09/11 2030 130/67 mmHg - - 114  28  98 % - -  04/09/11 2000 141/73 mmHg - - 115  20  95 % - -  04/09/11 1950 132/68 mmHg 98.9 F (37.2 C) Oral 112  20  97 % - -  04/09/11 1945 - - - 114  28  98 % - -  04/09/11 1930 77/50 mmHg - - 114  26  96 % - -  04/09/11 1902 126/72 mmHg - - - 28  95 % - -  04/09/11  1815 - - - - - 100 % - -  04/09/11 1720 144/76 mmHg - - 123  33  95 % - -  04/09/11 1658 - 98.5 F (36.9 C) Oral 120  18  98 % 5\' 2"  (1.575 m) 57.607 kg (127 lb)    1. General:  in No Acute distress 2. Psychological: Alert and Oriented 3. Head/ENT:   Dry Mucous Membranes                          Head Non traumatic, neck supple                          Normal  Dentition 4. SKIN:  decreased Skin turgor,  Skin clean Dry and intact On abdomen only excoriated macular  rash 5. Heart: Regular rate and rhythm but rapid,  no Murmur, Rub or gallop 6. Lungs: Coarse breath sounds occasional wheezes no crackles   7. Abdomen: Soft, non-tender, Non distended 8. Lower extremities: no clubbing, cyanosis, or edema 9. Neurologically Grossly intact, moving all 4 extremities equally 10. MSK: Normal range of motion  body mass index is 23.23 kg/(m^2).   Labs on Admission:   Basename 04/09/11 1800  NA 131*  K 4.1  CL 93*  CO2 28  GLUCOSE 142*  BUN 33*  CREATININE 0.91  CALCIUM 10.9*  MG --  PHOS --   No results found for this basename: AST:2,ALT:2,ALKPHOS:2,BILITOT:2,PROT:2,ALBUMIN:2 in the last 72 hours No results found for this basename: LIPASE:2,AMYLASE:2 in the last 72 hours  Basename 04/09/11 1800  WBC 20.9*  NEUTROABS --  HGB 11.0*  HCT 32.3*  MCV 89.0  PLT 276   No results found for this basename: CKTOTAL:3,CKMB:3,CKMBINDEX:3,TROPONINI:3 in the last 72 hours No results found for this basename: TSH,T4TOTAL,FREET3,T3FREE,THYROIDAB in the last 72 hours No results found for this basename: VITAMINB12:2,FOLATE:2,FERRITIN:2,TIBC:2,IRON:2,RETICCTPCT:2 in the last 72 hours No results found for this basename: HGBA1C    Estimated Creatinine Clearance: 48.7 ml/min (by C-G formula based on Cr of 0.91). ABG No results found for this basename: phart, pco2, po2, hco3, tco2, acidbasedef, o2sat     No results found for this basename: DDIMER     Other results:  I have pearsonaly reviewed  this: ECG REPORT  Rate 112  Rhythm: sinus tachycardia ST&T Change: Nonspecific and ST changes in V3  BNP 13257.0   Cultures:    Component Value Date/Time   SDES BRONCHIAL WASHINGS 08/30/2009 0800   SDES BRONCHIAL WASHINGS 08/30/2009 0800   SDES BRONCHIAL WASHINGS 08/30/2009 0800   SPECREQUEST NONE 08/30/2009 0800   SPECREQUEST NONE 08/30/2009 0800  SPECREQUEST NONE 08/30/2009 0800   CULT NO ACID FAST BACILLI ISOLATED IN 6 WEEKS 08/30/2009 0800   CULT NO GROWTH 2 DAYS 08/30/2009 0800   CULT No Fungi Isolated in 4 Weeks 08/30/2009 0800   REPTSTATUS 10/11/2009 FINAL 08/30/2009 0800   REPTSTATUS 09/01/2009 FINAL 08/30/2009 0800   REPTSTATUS 09/26/2009 FINAL 08/30/2009 0800       Radiological Exams on Admission: Dg Chest Port 1v Same Day  04/09/2011  *RADIOLOGY REPORT*  Clinical Data: Short of breath  PORTABLE CHEST - 1 VIEW SAME DAY  Comparison: Chest radiograph 04/06/2011, CT thorax 03/06/2011 the  Findings: Stable cardiac silhouette.  There is volume loss in the left hemithorax with tenting of the left hemidiaphragm.  There is a consolidative fibrosis in the left suprahilar region unchanged. There is increased fine nodular reticular pattern within the right lower lobe compared to prior.  No pneumothorax.  IMPRESSION:  1.  Increased reticular nodular pattern in the right lower lobe. This could represent atypical infection. 2.  Stable  postoperative and post therapy changes in the left lung.  Original Report Authenticated By: Genevive Bi, M.D.    Assessment/Plan  This is a 66 year old female with history of non-small cell lung cancer in stage IIIa diagnosed in 2011 status post radiation and chemotherapy. Here with seems to be COPD exacerbation (although a typical pneumonia could not be ruled out) and dehydration  Present on Admission:  .COPD with acute exacerbation -  Will continue  prednisone taper, start on Avelox, Xopenex PRN, scheduled Atrovent,  and Mucinex. Titrate O2 to saturation >90%. Follow  patients respiratory status.   .Leukocytosis - patient has been on steroids, will cover with Avelox for now for possible infiltrate.  .Hyponatremia  - likely secondary to dehydration, will give IVF, check Urine Na, Cr, Osmolarity. Monitor Na levels to avoid over aggressive correction. Check TSH. Stop offending medications. If no improvement with IVF will initiate further work up for SIADH if appropriate.   .Anemia - Anemia panel  .Abnormal CXR - Will cover with Avelox for atypical infection for now and reimage. Shortness of breath - likely secondary to copd exacerbation, given that she states the onset was somewhat rapid will obtain CTA of the chest to RO PE. Elevated BNP an be seen in COPD .Rash - Possible viral in etiology, improving Tachycardia - patient appears dehydrated, have been receiving breathing treatments, but given hx of lung ca recent travel and SOB will obtain CTA to r/o PE as well - Elevated Troponin - this was brought to my attention, patient had no chest pain, will continue to cycle enzymes. And repeat ECG, as seen above will evaluate for PE. Prophylaxis:  Lovenox, Protonix  CODE STATUS: DNR/DNI per patients request   Hien Cunliffe 04/09/2011, 9:38 PM   Addendum: cardiac enzymes continued to go up, have spoken to [facility] cardiology who will see patient, mean while will heparnize and give betablocker to improve heart rate in case this is demand ischemia. Blood pressure remains stable. No chest pain

## 2011-04-09 NOTE — Telephone Encounter (Signed)
gve the pt's husband the  May 2013 appt calendar along with the ct scan appt.

## 2011-04-09 NOTE — Progress Notes (Signed)
[facility] OFFICE PROGRESS NOTE   PRINCIPAL DIAGNOSIS:  Limited stage small cell lung cancer diagnosed in June 2011.  PRIOR THERAPY:   1. Status post 4 cycles of systemic chemotherapy with carboplatin and etoposide.  Last dose was given 11/14/2009.  This was concurrent with radiotherapy. 2. Status post prophylactic cranial irradiation completed January 28, 2010.  CURRENT THERAPY:  Observation.  INTERVAL HISTORY: Claudia [name] 66 y.o. female returns to the clinic today for  four-month followup visit accompanied by her husband. The patient has recent flulike symptoms and was started on Z-Pak and Hycodan by her primary care physician. The patient developed a significant skin rash yesterday. She stopped her medication. She has difficulty breathing today with dry cough but no fever or chills. She denied having any significant weight loss or night sweats. No nausea or vomiting. She has repeat CT scan of the chest performed recently and she is here today for evaluation and discussion of her scan results.  MEDICAL HISTORY: Past Medical History  Diagnosis Date  . Cancer     lung ca    ALLERGIES:   has no known allergies.  MEDICATIONS:  Current Outpatient Prescriptions  Medication Sig Dispense Refill  . azithromycin (ZITHROMAX) 250 MG tablet Take 250 mg by mouth daily.      Marland Kitchen Phenyleph-Chlorphen-Hydrocod (HYDROCODONE-PE-CHLORPHENIRAMIN PO) Take by mouth.        REVIEW OF SYSTEMS:  A comprehensive review of systems was negative except for: Constitutional: positive for anorexia and fatigue Respiratory: positive for chronic bronchitis, cough and wheezing   PHYSICAL EXAMINATION: General appearance: alert, cooperative and no distress Head: Normocephalic, without obvious abnormality, atraumatic Neck: no adenopathy Lymph nodes: Cervical, supraclavicular, and axillary nodes normal. Resp: clear to auscultation bilaterally Cardio: regular rate and rhythm, S1, S2 normal, no murmur,  click, rub or gallop GI: soft, non-tender; bowel sounds normal; no masses,  no organomegaly Extremities: extremities normal, atraumatic, no cyanosis or edema Neurologic: Alert and oriented X 3, normal strength and tone. Normal symmetric reflexes. Normal coordination and gait  ECOG PERFORMANCE STATUS: 1 - Symptomatic but completely ambulatory  Blood pressure 132/77, pulse 125, temperature 97.9 F (36.6 C), weight 127 lb 9.6 oz (57.879 kg), SpO2 75.00%.  LABORATORY DATA: Lab Results  Component Value Date   WBC 5.3 03/06/2011   HGB 12.8 03/06/2011   HCT 38.0 03/06/2011   MCV 93.5 03/06/2011   PLT 166 03/06/2011      Chemistry      Component Value Date/Time   NA 143 03/06/2011 0919   NA 138 11/03/2010 1420   K 4.5 03/06/2011 0919   K 3.8 11/03/2010 1420   CL 103 03/06/2011 0919   CL 102 11/03/2010 1420   CO2 31 03/06/2011 0919   CO2 28 11/03/2010 1420   BUN 12 03/06/2011 0919   BUN 13 11/03/2010 1420   CREATININE 1.0 03/06/2011 0919   CREATININE 0.73 11/03/2010 1420      Component Value Date/Time   CALCIUM 9.3 03/06/2011 0919   CALCIUM 10.5 11/03/2010 1420   ALKPHOS 80 03/06/2011 0919   ALKPHOS 88 11/03/2010 1420   AST 22 03/06/2011 0919   AST 16 11/03/2010 1420   ALT 12 11/03/2010 1420   BILITOT 0.70 03/06/2011 0919   BILITOT 0.5 11/03/2010 1420       RADIOGRAPHIC STUDIES: Dg Chest 2 View  04/06/2011  *RADIOLOGY REPORT*  Clinical Data: Pneumonia, cough and fever.  CHEST - 2 VIEW  Comparison: CT chest 03/06/2011.  Findings: Trachea  is midline.  Heart size normal.  Radiation fibrosis and volume loss are seen in the left perihilar region. Biapical pleural thickening, left greater than right.  No pleural fluid.  IMPRESSION: Radiation fibrosis and volume loss in the left hemithorax.  No acute findings.  Original Report Authenticated By: Reyes Ivan, M.D.   CT CHEST WITH CONTRAST (03/06/2011) Technique: Multidetector CT imaging of the chest was performed  following the standard protocol during bolus  administration of  intravenous contrast.  Contrast: 80mL OMNIPAQUE IOHEXOL 300 MG/ML IV SOLN  Comparison: 11/03/2010.  Findings: Lung windows demonstrate volume loss in the left  hemithorax with tracheal deviation to the left. Mild centrilobular  emphysema.  4 mm right lower lobe lung nodule which is unchanged on image 40.  Minimal subpleural nodularity in the right lower lobe on image 39.  Right lower lobe nodule on image 32 is similar at 3 mm.  Similar configuration of paramediastinal fibrosis on the left.  Soft tissue windows demonstrate normal heart size. Small anterior  pericardial effusion is not significantly changed. Trace left-  sided pleural fluid is new. No evidence of pulmonary embolism. No  mediastinal or hilar adenopathy. Mildly dilated esophagus with  debris within on image 16. Similar appearance of the loculated  left pleural fluid anteriorly on image 15.  Limited abdominal imaging demonstrates redemonstration of free  intraperitoneal air. Similar to 11/03/2010 and 08/06/2010.  Interpolar left renal lesion is likely a cyst, but incompletely  imaged. Normal adrenal glands. A lateral segment left liver lobe  lesion is a stable hemangioma at 1.7 cm. No acute osseous  abnormality.  IMPRESSION:  1. Similar appearance of radiation changes within the left  hemithorax. No evidence of residual/recurrent or metastatic  disease.  2. Similar small pericardial effusion with new trace left pleural  fluid.  3. Extensive free intraperitoneal air. Similar findings on the  08/06/2010 and 11/03/2010 CTs. This was evaluated on those days,  without cause identified. Favored to be idiopathic/incidental in  this patient. These results will be called to the ordering  clinician or representative by the Radiologist Assistant, and  communication documented in the PACS Dashboard.  4. Esophageal dilatation and debris suggests dysmotility or  gastroesophageal reflux.  Original Report  Authenticated By: Consuello Bossier, M.D.    ASSESSMENT: This is a very pleasant 66 years old white female with limited stage small cell lung cancer diagnosed in June of 2011. She status post 4 cycles of systemic chemotherapy was carboplatin and etoposide concurrent with radiation followed by prophylactic cranial irradiation. The patient has been observation since November of 2011. She has no evidence for disease recurrence. I discussed the scan results with the patient and her husband.   PLAN:  #1 I recommended for the patient continuous observation with repeat CT scan of the chest in 4 months. She would come back for followup visit at that time. #2 for the allergic reaction to her recent medications, I advised the patient to quit date and Z-Pak and Hycodan. I will call prescription for Medrol Dosepak and I will start the patient on Benadryl 25 mg by mouth every 6 hours as needed. The patient was advised what to the emergency department immediately if she continues to have significant shortness of breath or no improvement in her allergy.  All questions were answered. The patient knows to call the clinic with any problems, questions or concerns. We can certainly see the patient much sooner if necessary.

## 2011-04-09 NOTE — Progress Notes (Signed)
Addended by: Amaryllis Dyke on: 04/09/2011 10:34 AM   Modules accepted: Orders

## 2011-04-09 NOTE — ED Notes (Signed)
Pt transferred to tcu 29, pt resting comfortable, without complaints, no increased wob, 02 sats 98 2l, awaiting IP bed

## 2011-04-09 NOTE — ED Notes (Signed)
Pt reports having sob with productive cough starting last week and went to pcp and was rx azithromycin and tussionex. States after 3 days of taking azithromycin she began developing rash to upper abdomen and arms. Reports itching yesterday. Pt denies having chest pain. NAD noted at this time.

## 2011-04-09 NOTE — ED Provider Notes (Signed)
History     CSN: [id number]  Arrival date & time 04/09/11  1650   First MD Initiated Contact with Patient 04/09/11 1719      Chief Complaint  Patient presents with  . Shortness of Breath    (Consider location/radiation/quality/duration/timing/severity/associated sxs/prior treatment) Patient is a 66 y.o. female presenting with shortness of breath. The history is provided by the patient.  Shortness of Breath  The current episode started 2 days ago (Pt had cough for about a week.  The wheezing was today.). The problem occurs continuously. The problem has been gradually worsening. Associated symptoms include cough, shortness of breath and wheezing. Pertinent negatives include no chest pain and no fever.  Pt saw a doctor on Monday and was diagnosed with influenza.  She had an xray and was started on abx.  Pt saw her cancer doctor today and was started on steroids.  She got worse and came to the ED.  Pt has been weak and not eating well.  She has not used inhalers in the past.  Past Medical History  Diagnosis Date  . Cancer     lung ca    Past Surgical History  Procedure Date  . Neck surgery     History reviewed. No pertinent family history.  History  Substance Use Topics  . Smoking status: Never Smoker   . Smokeless tobacco: Not on file  . Alcohol Use: Yes     occassional    OB History    Grav Para Term Preterm Abortions TAB SAB Ect Mult Living                  Review of Systems  Constitutional: Negative for fever.  Respiratory: Positive for cough, shortness of breath and wheezing.   Cardiovascular: Negative for chest pain.    Allergies  Azithromycin and Tussionex pennkinetic er  Home Medications   Current Outpatient Rx  Name Route Sig Dispense Refill  . DIPHENHYDRAMINE HCL 25 MG PO TABS Oral Take 25 mg by mouth every 6 (six) hours as needed. Allergic reaction    . OMEGA-3 FATTY ACIDS 1000 MG PO CAPS Oral Take 1 g by mouth daily.    . METHYLPREDNISOLONE 4 MG  PO KIT Oral Take by mouth. follow package directions      Pulse 120  Temp(Src) 98.5 F (36.9 C) (Oral)  Resp 18  Ht 5\' 2"  (1.575 m)  Wt 127 lb (57.607 kg)  BMI 23.23 kg/m2  SpO2 98%  Physical Exam  Nursing note and vitals reviewed. Constitutional: No distress.  HENT:  Head: Normocephalic and atraumatic.  Right Ear: External ear normal.  Left Ear: External ear normal.  Eyes: Conjunctivae are normal. Right eye exhibits no discharge. Left eye exhibits no discharge. No scleral icterus.  Neck: Neck supple. No tracheal deviation present.  Cardiovascular: Normal rate, regular rhythm and intact distal pulses.   Pulmonary/Chest: Effort normal. No stridor. No respiratory distress. She has wheezes in the left lower field. She has rales in the right lower field and the left lower field.  Abdominal: Soft. Bowel sounds are normal. She exhibits no distension. There is no tenderness. There is no rebound and no guarding.  Musculoskeletal: She exhibits no edema and no tenderness.  Neurological: She is alert. She has normal strength. No sensory deficit. Cranial nerve deficit:  no gross defecits noted. She exhibits normal muscle tone. She displays no seizure activity. Coordination normal.  Skin: Skin is warm and dry. No rash noted.  Psychiatric: She has  a normal mood and affect.    ED Course  Procedures (including critical care time)  Date: 04/09/2011  Rate: 121  Rhythm: sinus tachycardia  QRS Axis: left  Intervals: normal  ST/T Wave abnormalities: nonspecific T wave changes  Conduction Disutrbances:none  Narrative Interpretation:   Old EKG Reviewed: changes noted rate faster, left axis deviation and   Labs Reviewed  PRO B NATRIURETIC PEPTIDE - Abnormal; Notable for the following:    Pro B Natriuretic peptide (BNP) 13257.0 (*)    All other components within normal limits  BASIC METABOLIC PANEL - Abnormal; Notable for the following:    Sodium 131 (*)    Chloride 93 (*)    Glucose, Bld  142 (*)    BUN 33 (*)    Calcium 10.9 (*)    GFR calc non Af Amer 65 (*)    GFR calc Af Amer 75 (*)    All other components within normal limits  CBC - Abnormal; Notable for the following:    WBC 20.9 (*)    RBC 3.63 (*)    Hemoglobin 11.0 (*)    HCT 32.3 (*)    All other components within normal limits   Dg Chest Port 1v Same Day  04/09/2011  *RADIOLOGY REPORT*  Clinical Data: Short of breath  PORTABLE CHEST - 1 VIEW SAME DAY  Comparison: Chest radiograph 04/06/2011, CT thorax 03/06/2011 the  Findings: Stable cardiac silhouette.  There is volume loss in the left hemithorax with tenting of the left hemidiaphragm.  There is a consolidative fibrosis in the left suprahilar region unchanged. There is increased fine nodular reticular pattern within the right lower lobe compared to prior.  No pneumothorax.  IMPRESSION:  1.  Increased reticular nodular pattern in the right lower lobe. This could represent atypical infection. 2.  Stable  postoperative and post therapy changes in the left lung.  Original Report Authenticated By: Genevive Bi, M.D.     1. COPD with acute exacerbation   2. Community acquired pneumonia       MDM  Patient's x-rays suggest increasing reticular nodule pattern the right lower lobe possibly consistent with an atypical infection. She does have elevation in her white blood cell count although she was placed on steroids recently. Her BNP is also significantly elevated however her pattern is not really suggestive of an acute CHF exacerbation.  We'll start the patient on IV antibiotics. Her tachycardia will give her a small fluid bolus. Patient will be monitored closely and admitted to the hospital for further evaluation.        Celene Kras, MD 04/09/11 2135

## 2011-04-09 NOTE — ED Notes (Signed)
Per Pt and spouse: Pt with lung CA, saw MD Mohammed for scheduled 6 month f/u today. Last xrt was in 2011, last chemo 10/2009.  Pt has had a cough and sore throat since last Thurs, was started on Z-pack on Monday afternoon by PCP. Was started today on Prednisone taper for presumed allergy to Z-pack--pt with wheezing and rash. Pt came to ED today because she does not feel better. On arrival to ED pat is 82% on RA. Weak and slightly confused. 88% on 2L. 96% on 4L n/c. Pt denies any chest pain. Lung sounds are diminished bilaterally. Pt is now alert and oriented.

## 2011-04-09 NOTE — ED Notes (Signed)
Transferred to [facility] tcu 29, iv avelox infusing

## 2011-04-09 NOTE — ED Notes (Signed)
Patient placed on cardiac monitor, nsr hr 110

## 2011-04-09 NOTE — Progress Notes (Signed)
Pt presents for f/u with shortness of breath audible  wheezing which pt states started last night after taking tussionex. Started azythromycin on Monday  for "pneumonia like symptoms".O2 sat 75 % initially . Rash present on abdomen- which pt noticed this am. Oxygen 2 liters nasal cannula started and o2 sat up to 91%. Dr Donnald Garre in to evaluate pt  Medrol dose pack called in and pt instructed to take immediately.

## 2011-04-09 NOTE — ED Notes (Signed)
Pt's troponin .33, md notified, additional orders received, awaiting ct for ct angio, spoke with ct and they are in process of coming to get patient to transport to radiology, pt resting comfortable, no complaints, will cont. To monitor

## 2011-04-10 ENCOUNTER — Encounter (HOSPITAL_COMMUNITY): Payer: Self-pay | Admitting: Radiology

## 2011-04-10 ENCOUNTER — Other Ambulatory Visit: Payer: Self-pay

## 2011-04-10 DIAGNOSIS — R7989 Other specified abnormal findings of blood chemistry: Secondary | ICD-10-CM | POA: Diagnosis present

## 2011-04-10 DIAGNOSIS — C3490 Malignant neoplasm of unspecified part of unspecified bronchus or lung: Secondary | ICD-10-CM | POA: Diagnosis present

## 2011-04-10 DIAGNOSIS — R778 Other specified abnormalities of plasma proteins: Secondary | ICD-10-CM | POA: Diagnosis present

## 2011-04-10 LAB — CULTURE, RESPIRATORY

## 2011-04-10 LAB — OSMOLALITY, URINE: Osmolality, Ur: 358 mOsm/kg — ABNORMAL LOW (ref 390–1090)

## 2011-04-10 LAB — CARDIAC PANEL(CRET KIN+CKTOT+MB+TROPI)
CK, MB: 7.7 ng/mL (ref 0.3–4.0)
CK, MB: 8.7 ng/mL (ref 0.3–4.0)
CK, MB: 7.6 ng/mL (ref 0.3–4.0)
CK, MB: 5.5 ng/mL — ABNORMAL HIGH (ref 0.3–4.0)
Relative Index: INVALID (ref 0.0–2.5)
Relative Index: INVALID (ref 0.0–2.5)
Relative Index: INVALID (ref 0.0–2.5)
Relative Index: INVALID (ref 0.0–2.5)
Total CK: 45 U/L (ref 7–177)
Total CK: 49 U/L (ref 7–177)
Total CK: 39 U/L (ref 7–177)
Total CK: 31 U/L (ref 7–177)
Troponin I: 0.42 ng/mL (ref <0.30)
Troponin I: 0.63 ng/mL (ref <0.30)
Troponin I: 0.45 ng/mL (ref <0.30)
Troponin I: <0.3 ng/mL (ref <0.30)

## 2011-04-10 LAB — URINALYSIS, ROUTINE W REFLEX MICROSCOPIC
Bilirubin Urine: NEGATIVE
Glucose, UA: NEGATIVE mg/dL
Ketones, ur: NEGATIVE mg/dL
Leukocytes, UA: NEGATIVE
Nitrite: NEGATIVE
Protein, ur: 30 mg/dL — AB
Specific Gravity, Urine: 1.014 (ref 1.005–1.030)
Urobilinogen, UA: 0.2 mg/dL (ref 0.0–1.0)
pH: 6.5 (ref 5.0–8.0)

## 2011-04-10 LAB — COMPREHENSIVE METABOLIC PANEL
ALT: 14 U/L (ref 0–35)
AST: 15 U/L (ref 0–37)
Albumin: 2.8 g/dL — ABNORMAL LOW (ref 3.5–5.2)
Alkaline Phosphatase: 123 U/L — ABNORMAL HIGH (ref 39–117)
BUN: 27 mg/dL — ABNORMAL HIGH (ref 6–23)
CO2: 29 mEq/L (ref 19–32)
Calcium: 10.9 mg/dL — ABNORMAL HIGH (ref 8.4–10.5)
Chloride: 95 mEq/L — ABNORMAL LOW (ref 96–112)
Creatinine, Ser: 0.84 mg/dL (ref 0.50–1.10)
GFR calc Af Amer: 83 mL/min — ABNORMAL LOW (ref >90)
GFR calc non Af Amer: 71 mL/min — ABNORMAL LOW (ref >90)
Glucose, Bld: 128 mg/dL — ABNORMAL HIGH (ref 70–99)
Potassium: 4.3 mEq/L (ref 3.5–5.1)
Sodium: 133 mEq/L — ABNORMAL LOW (ref 135–145)
Total Bilirubin: 0.3 mg/dL (ref 0.3–1.2)
Total Protein: 7.5 g/dL (ref 6.0–8.3)

## 2011-04-10 LAB — FERRITIN: Ferritin: 1680 ng/mL — ABNORMAL HIGH (ref 10–291)

## 2011-04-10 LAB — IRON AND TIBC
Iron: 29 ug/dL — ABNORMAL LOW (ref 42–135)
Saturation Ratios: 18 % — ABNORMAL LOW (ref 20–55)
TIBC: 159 ug/dL — ABNORMAL LOW (ref 250–470)
UIBC: 130 ug/dL (ref 125–400)

## 2011-04-10 LAB — FOLATE: Folate: 12.6 ng/mL

## 2011-04-10 LAB — CBC
HCT: 32 % — ABNORMAL LOW (ref 36.0–46.0)
Hemoglobin: 10.9 g/dL — ABNORMAL LOW (ref 12.0–15.0)
MCH: 30.6 pg (ref 26.0–34.0)
MCHC: 34.1 g/dL (ref 30.0–36.0)
MCV: 89.9 fL (ref 78.0–100.0)
Platelets: 315 10*3/uL (ref 150–400)
RBC: 3.56 MIL/uL — ABNORMAL LOW (ref 3.87–5.11)
RDW: 13.6 % (ref 11.5–15.5)
WBC: 23 10*3/uL — ABNORMAL HIGH (ref 4.0–10.5)

## 2011-04-10 LAB — RETICULOCYTES
RBC.: 3.56 MIL/uL — ABNORMAL LOW (ref 3.87–5.11)
Retic Count, Absolute: 39.2 10*3/uL (ref 19.0–186.0)
Retic Ct Pct: 1.1 % (ref 0.4–3.1)

## 2011-04-10 LAB — EXPECTORATED SPUTUM ASSESSMENT W GRAM STAIN, RFLX TO RESP C: Special Requests: NORMAL

## 2011-04-10 LAB — CULTURE, RESPIRATORY W GRAM STAIN

## 2011-04-10 LAB — PROTIME-INR
INR: 1.37 (ref 0.00–1.49)
Prothrombin Time: 17.1 seconds — ABNORMAL HIGH (ref 11.6–15.2)

## 2011-04-10 LAB — VITAMIN B12: Vitamin B-12: >2000 pg/mL — ABNORMAL HIGH (ref 211–911)

## 2011-04-10 LAB — TSH: TSH: 0.147 u[IU]/mL — ABNORMAL LOW (ref 0.350–4.500)

## 2011-04-10 LAB — APTT: aPTT: 45 seconds — ABNORMAL HIGH (ref 24–37)

## 2011-04-10 LAB — URINE MICROSCOPIC-ADD ON

## 2011-04-10 LAB — HEPARIN LEVEL (UNFRACTIONATED)
Heparin Unfractionated: <0.1 IU/mL — ABNORMAL LOW (ref 0.30–0.70)
Heparin Unfractionated: <0.1 IU/mL — ABNORMAL LOW (ref 0.30–0.70)

## 2011-04-10 LAB — PHOSPHORUS: Phosphorus: 2.7 mg/dL (ref 2.3–4.6)

## 2011-04-10 LAB — MAGNESIUM: Magnesium: 2.1 mg/dL (ref 1.5–2.5)

## 2011-04-10 LAB — NA AND K (SODIUM & POTASSIUM), RAND UR
Potassium Urine: 10 mEq/L
Sodium, Ur: 7 mEq/L

## 2011-04-10 MED ORDER — IPRATROPIUM BROMIDE 0.02 % IN SOLN
0.5000 mg | Freq: Four times a day (QID) | RESPIRATORY_TRACT | Status: DC
  Administered 2011-04-10 – 2011-04-11 (×7): 0.5 mg via RESPIRATORY_TRACT
  Filled 2011-04-10 (×7): qty 2.5

## 2011-04-10 MED ORDER — DIPHENHYDRAMINE HCL 25 MG PO CAPS
25.0000 mg | ORAL_CAPSULE | Freq: Four times a day (QID) | ORAL | Status: DC | PRN
  Filled 2011-04-10: qty 1

## 2011-04-10 MED ORDER — SODIUM CHLORIDE 0.9 % IV SOLN: INTRAVENOUS | Status: AC

## 2011-04-10 MED ORDER — ALBUTEROL SULFATE (5 MG/ML) 0.5% IN NEBU: 2.5000 mg | INHALATION_SOLUTION | RESPIRATORY_TRACT | Status: DC | PRN

## 2011-04-10 MED ORDER — ACETAMINOPHEN 325 MG PO TABS
650.0000 mg | ORAL_TABLET | Freq: Four times a day (QID) | ORAL | Status: DC | PRN
  Filled 2011-04-10: qty 2

## 2011-04-10 MED ORDER — HEPARIN BOLUS VIA INFUSION
3000.0000 [IU] | Freq: Once | INTRAVENOUS | Status: AC
  Administered 2011-04-10: 3000 [IU] via INTRAVENOUS
  Filled 2011-04-10: qty 3000

## 2011-04-10 MED ORDER — HEPARIN BOLUS VIA INFUSION
2000.0000 [IU] | Freq: Once | INTRAVENOUS | Status: AC
  Administered 2011-04-10: 2000 [IU] via INTRAVENOUS
  Filled 2011-04-10: qty 2000

## 2011-04-10 MED ORDER — HEPARIN SOD (PORCINE) IN D5W 100 UNIT/ML IV SOLN
12.0000 [IU]/kg/h | INTRAVENOUS | Status: DC
  Administered 2011-04-10: 12 [IU]/kg/h via INTRAVENOUS
  Filled 2011-04-10: qty 250

## 2011-04-10 MED ORDER — ONDANSETRON HCL 4 MG PO TABS
4.0000 mg | ORAL_TABLET | Freq: Four times a day (QID) | ORAL | Status: DC | PRN
  Filled 2011-04-10: qty 1

## 2011-04-10 MED ORDER — HEPARIN SOD (PORCINE) IN D5W 100 UNIT/ML IV SOLN
950.0000 [IU]/h | INTRAVENOUS | Status: DC
  Administered 2011-04-10: 950 [IU]/h via INTRAVENOUS
  Filled 2011-04-10 (×2): qty 250

## 2011-04-10 MED ORDER — LEVOFLOXACIN 750 MG PO TABS
750.0000 mg | ORAL_TABLET | Freq: Every day | ORAL | Status: DC
Start: 2011-04-10 — End: 2011-04-14
  Administered 2011-04-10 – 2011-04-14 (×5): 750 mg via ORAL
  Filled 2011-04-10: qty 2
  Filled 2011-04-10 (×4): qty 1

## 2011-04-10 MED ORDER — ASPIRIN 325 MG PO TABS
325.0000 mg | ORAL_TABLET | Freq: Every day | ORAL | Status: DC
  Administered 2011-04-10 – 2011-04-14 (×5): 325 mg via ORAL
  Filled 2011-04-10 (×5): qty 1

## 2011-04-10 MED ORDER — ONDANSETRON HCL 4 MG/2ML IJ SOLN
4.0000 mg | Freq: Four times a day (QID) | INTRAMUSCULAR | Status: DC | PRN
  Filled 2011-04-10: qty 2

## 2011-04-10 MED ORDER — ENOXAPARIN SODIUM 40 MG/0.4ML ~~LOC~~ SOLN: 40.0000 mg | SUBCUTANEOUS | Status: DC

## 2011-04-10 MED ORDER — ALUM & MAG HYDROXIDE-SIMETH 200-200-20 MG/5ML PO SUSP
30.0000 mL | Freq: Four times a day (QID) | ORAL | Status: DC | PRN
  Filled 2011-04-10: qty 30

## 2011-04-10 MED ORDER — SODIUM CHLORIDE 0.9 % IV BOLUS (SEPSIS): 500.0000 mL | Freq: Once | INTRAVENOUS | Status: DC

## 2011-04-10 MED ORDER — PREDNISONE 20 MG PO TABS
40.0000 mg | ORAL_TABLET | Freq: Every day | ORAL | Status: DC
  Administered 2011-04-11 – 2011-04-14 (×4): 40 mg via ORAL
  Filled 2011-04-10 (×5): qty 2

## 2011-04-10 MED ORDER — METOPROLOL TARTRATE 12.5 MG HALF TABLET
12.5000 mg | ORAL_TABLET | Freq: Four times a day (QID) | ORAL | Status: DC
Start: 2011-04-10 — End: 2011-04-14
  Administered 2011-04-10 – 2011-04-14 (×11): 12.5 mg via ORAL
  Filled 2011-04-10 (×21): qty 1

## 2011-04-10 MED ORDER — METHYLPREDNISOLONE SODIUM SUCC 125 MG IJ SOLR
60.0000 mg | INTRAMUSCULAR | Status: DC
  Administered 2011-04-10: 60 mg via INTRAVENOUS
  Filled 2011-04-10: qty 2

## 2011-04-10 MED ORDER — HEPARIN SOD (PORCINE) IN D5W 100 UNIT/ML IV SOLN
1150.0000 [IU]/h | INTRAVENOUS | Status: DC
  Administered 2011-04-10: 1150 [IU]/h via INTRAVENOUS
  Filled 2011-04-10 (×2): qty 250

## 2011-04-10 MED ORDER — PANTOPRAZOLE SODIUM 40 MG PO TBEC
40.0000 mg | DELAYED_RELEASE_TABLET | Freq: Every day | ORAL | Status: DC
  Administered 2011-04-10 – 2011-04-14 (×5): 40 mg via ORAL
  Filled 2011-04-10 (×6): qty 1

## 2011-04-10 MED ORDER — FUROSEMIDE 10 MG/ML IJ SOLN
40.0000 mg | Freq: Once | INTRAMUSCULAR | Status: AC
  Administered 2011-04-10: 40 mg via INTRAVENOUS
  Filled 2011-04-10: qty 4

## 2011-04-10 MED ORDER — GUAIFENESIN ER 600 MG PO TB12
600.0000 mg | ORAL_TABLET | Freq: Two times a day (BID) | ORAL | Status: DC
  Administered 2011-04-10 – 2011-04-14 (×9): 600 mg via ORAL
  Filled 2011-04-10 (×10): qty 1

## 2011-04-10 MED ORDER — ACETAMINOPHEN 650 MG RE SUPP
650.0000 mg | Freq: Four times a day (QID) | RECTAL | Status: DC | PRN
  Filled 2011-04-10: qty 1

## 2011-04-10 MED ORDER — IOHEXOL 300 MG/ML  SOLN
100.0000 mL | Freq: Once | INTRAMUSCULAR | Status: AC | PRN
  Administered 2011-04-10: 100 mL via INTRAVENOUS

## 2011-04-10 MED ORDER — LEVALBUTEROL HCL 0.63 MG/3ML IN NEBU
0.6300 mg | INHALATION_SOLUTION | RESPIRATORY_TRACT | Status: DC | PRN
  Filled 2011-04-10: qty 3

## 2011-04-10 NOTE — Consult Note (Signed)
Reason for Consult: positive Troponin  Requesting Physician:   HPI: This is a 66 y.o. female with a past medical history significant for lung cancer status post radiation and chemotherapy in the spring of 2011 she has no history of coronary disease. She has no history of chest pain or anginal symptoms. She has never seen a cardiologist. I can find no record of an echocardiogram that was done prior to her chemotherapy. Patient was in her usual state of health until earlier this week when she was at the beach. She developed the "flu". She began to have cough and some dyspnea. She with her primary care doctor at [facility] and received antibiotics and steroids. After 48 hours she developed a rash from her 8 azithromycin. She stopped her medications. She had a followup yesterday with Dr. Shirline Frees at the oncology center. He resumed her steroids and added Benadryl for her rash. She presented emergency room last night with dyspnea. She denies chest pain. Her troponins are slightly elevated. His her first troponin was 0.42 and subsequent troponin is 0.63. Her BNP is also markedly elevated at 13,275. She did have a CT scan for possible pulmonary embolism. It somewhat difficult to interpret this for congestive failure as she has multiple findings. Essentially it suggests early edema versus pneumonia. The patient's white count is 23,000. She is afebrile. She's received one dose of Lopressor and a breathing treatment. She still has some dyspnea at rest but is improved from admission. She denies any orthopnea. She denies any lower extremity edema.  PMHx:  Past Medical History  Diagnosis Date  . COPD (chronic obstructive pulmonary disease)   . Anemia   . Lung cancer 2010    Dr. Arbutus Ped, finished chemo, sp radiation, left upper    Past Surgical History  Procedure Date  . Neck surgery     FAMHx: Family History  Problem Relation Age of Onset  . Cancer Mother     brain cancer  . Diabetes Son     SOCHx:  reports that she quit smoking about 3 years ago. She does not have any smokeless tobacco history on file. She reports that she drinks alcohol. She reports that she does not use illicit drugs.  ALLERGIES: Allergies  Allergen Reactions  . Azithromycin Shortness Of Breath and Rash    Took Tussionex at same time Jan 2013.  . Tussionex Pennkinetic Er (Hydrocod Polst-Cpm Polst) Shortness Of Breath and Rash    Took Azithromycin at same time Jan 2013    ROS: Pertinent items are noted in HPI.  HOME MEDICATIONS:  (Not in a hospital admission)  HOSPITAL MEDICATIONS: I have reviewed the patient's current medications.  VITALS: Blood pressure 140/113, pulse 110, temperature 98 F (36.7 C), temperature source Oral, resp. rate 24, height 5\' 2"  (1.575 m), weight 57.607 kg (127 lb), SpO2 96.00%.  PHYSICAL EXAM: General appearance: alert, cooperative and mild distress Neck: no adenopathy, no carotid bruit, no JVD, supple, symmetrical, trachea midline and thyroid not enlarged, symmetric, no tenderness/mass/nodules Lungs: diffuse rhonchi and wheezing. overall diminished breath sounds Heart: regular rate and rhythm Abdomen: not distended. no HJR Extremities: no edema Pulses: 2+ and symmetric Skin: Skin color, texture, turgor normal. No rashes or lesions Neurologic: Grossly normal  LABS: Results for orders placed during the hospital encounter of 04/09/11 (from the past 48 hour(s))  PRO B NATRIURETIC PEPTIDE     Status: Abnormal   Collection Time   04/09/11  6:00 PM      Component Value Range Comment  Pro B Natriuretic peptide (BNP) 13257.0 (*) 0 - 125 (pg/mL)   BASIC METABOLIC PANEL     Status: Abnormal   Collection Time   04/09/11  6:00 PM      Component Value Range Comment   Sodium 131 (*) 135 - 145 (mEq/L)    Potassium 4.1  3.5 - 5.1 (mEq/L)    Chloride 93 (*) 96 - 112 (mEq/L)    CO2 28  19 - 32 (mEq/L)    Glucose, Bld 142 (*) 70 - 99 (mg/dL)    BUN 33 (*) 6 - 23 (mg/dL)     Creatinine, Ser 9.14  0.50 - 1.10 (mg/dL)    Calcium 78.2 (*) 8.4 - 10.5 (mg/dL)    GFR calc non Af Amer 65 (*) >90 (mL/min)    GFR calc Af Amer 75 (*) >90 (mL/min)   CBC     Status: Abnormal   Collection Time   04/09/11  6:00 PM      Component Value Range Comment   WBC 20.9 (*) 4.0 - 10.5 (K/uL)    RBC 3.63 (*) 3.87 - 5.11 (MIL/uL)    Hemoglobin 11.0 (*) 12.0 - 15.0 (g/dL)    HCT 95.6 (*) 21.3 - 46.0 (%)    MCV 89.0  78.0 - 100.0 (fL)    MCH 30.3  26.0 - 34.0 (pg)    MCHC 34.1  30.0 - 36.0 (g/dL)    RDW 08.6  57.8 - 46.9 (%)    Platelets 276  150 - 400 (K/uL)   LACTIC ACID, PLASMA     Status: Normal   Collection Time   04/09/11  8:15 PM      Component Value Range Comment   Lactic Acid, Venous 1.5  0.5 - 2.2 (mmol/L)   PROCALCITONIN     Status: Normal   Collection Time   04/09/11  8:16 PM      Component Value Range Comment   Procalcitonin 4.75     POCT I-STAT TROPONIN I     Status: Abnormal   Collection Time   04/09/11  8:24 PM      Component Value Range Comment   Troponin i, poc 0.32 (*) 0.00 - 0.08 (ng/mL)    Comment NOTIFIED PHYSICIAN      Comment 3            CARDIAC PANEL(CRET KIN+CKTOT+MB+TROPI)     Status: Abnormal   Collection Time   04/10/11 12:00 AM      Component Value Range Comment   Total CK 45  7 - 177 (U/L)    CK, MB 7.7 (*) 0.3 - 4.0 (ng/mL)    Troponin I 0.42 (*) <0.30 (ng/mL)    Relative Index RELATIVE INDEX IS INVALID  0.0 - 2.5    APTT     Status: Abnormal   Collection Time   04/10/11 12:01 AM      Component Value Range Comment   aPTT 45 (*) 24 - 37 (seconds)   PROTIME-INR     Status: Abnormal   Collection Time   04/10/11 12:01 AM      Component Value Range Comment   Prothrombin Time 17.1 (*) 11.6 - 15.2 (seconds)    INR 1.37  0.00 - 1.49    URINALYSIS, ROUTINE W REFLEX MICROSCOPIC     Status: Abnormal   Collection Time   04/10/11 12:36 AM      Component Value Range Comment   Color, Urine YELLOW  YELLOW  APPearance CLEAR  CLEAR     Specific  Gravity, Urine 1.014  1.005 - 1.030     pH 6.5  5.0 - 8.0     Glucose, UA NEGATIVE  NEGATIVE (mg/dL)    Hgb urine dipstick SMALL (*) NEGATIVE     Bilirubin Urine NEGATIVE  NEGATIVE     Ketones, ur NEGATIVE  NEGATIVE (mg/dL)    Protein, ur 30 (*) NEGATIVE (mg/dL)    Urobilinogen, UA 0.2  0.0 - 1.0 (mg/dL)    Nitrite NEGATIVE  NEGATIVE     Leukocytes, UA NEGATIVE  NEGATIVE    URINE MICROSCOPIC-ADD ON     Status: Normal   Collection Time   04/10/11 12:36 AM      Component Value Range Comment   Squamous Epithelial / LPF RARE  RARE     RBC / HPF 0-2  <3 (RBC/hpf)   CARDIAC PANEL(CRET KIN+CKTOT+MB+TROPI)     Status: Abnormal   Collection Time   04/10/11  4:43 AM      Component Value Range Comment   Total CK 49  7 - 177 (U/L)    CK, MB 8.7 (*) 0.3 - 4.0 (ng/mL) CRITICAL VALUE NOTED.  VALUE IS CONSISTENT WITH PREVIOUSLY REPORTED AND CALLED VALUE.   Troponin I 0.63 (*) <0.30 (ng/mL)    Relative Index RELATIVE INDEX IS INVALID  0.0 - 2.5    MAGNESIUM     Status: Normal   Collection Time   04/10/11  4:43 AM      Component Value Range Comment   Magnesium 2.1  1.5 - 2.5 (mg/dL)   PHOSPHORUS     Status: Normal   Collection Time   04/10/11  4:43 AM      Component Value Range Comment   Phosphorus 2.7  2.3 - 4.6 (mg/dL)   COMPREHENSIVE METABOLIC PANEL     Status: Abnormal   Collection Time   04/10/11  4:43 AM      Component Value Range Comment   Sodium 133 (*) 135 - 145 (mEq/L)    Potassium 4.3  3.5 - 5.1 (mEq/L)    Chloride 95 (*) 96 - 112 (mEq/L)    CO2 29  19 - 32 (mEq/L)    Glucose, Bld 128 (*) 70 - 99 (mg/dL)    BUN 27 (*) 6 - 23 (mg/dL)    Creatinine, Ser 1.61  0.50 - 1.10 (mg/dL)    Calcium 09.6 (*) 8.4 - 10.5 (mg/dL)    Total Protein 7.5  6.0 - 8.3 (g/dL)    Albumin 2.8 (*) 3.5 - 5.2 (g/dL)    AST 15  0 - 37 (U/L)    ALT 14  0 - 35 (U/L)    Alkaline Phosphatase 123 (*) 39 - 117 (U/L)    Total Bilirubin 0.3  0.3 - 1.2 (mg/dL)    GFR calc non Af Amer 71 (*) >90 (mL/min)    GFR  calc Af Amer 83 (*) >90 (mL/min)   CBC     Status: Abnormal   Collection Time   04/10/11  4:43 AM      Component Value Range Comment   WBC 23.0 (*) 4.0 - 10.5 (K/uL)    RBC 3.56 (*) 3.87 - 5.11 (MIL/uL)    Hemoglobin 10.9 (*) 12.0 - 15.0 (g/dL)    HCT 04.5 (*) 40.9 - 46.0 (%)    MCV 89.9  78.0 - 100.0 (fL)    MCH 30.6  26.0 - 34.0 (pg)    MCHC  34.1  30.0 - 36.0 (g/dL)    RDW 40.9  81.1 - 91.4 (%)    Platelets 315  150 - 400 (K/uL)   RETICULOCYTES     Status: Abnormal   Collection Time   04/10/11  4:43 AM      Component Value Range Comment   Retic Ct Pct 1.1  0.4 - 3.1 (%)    RBC. 3.56 (*) 3.87 - 5.11 (MIL/uL)    Retic Count, Manual 39.2  19.0 - 186.0 (K/uL)     IMAGING: Ct Angio Chest W/cm &/or Wo Cm  04/10/2011  *RADIOLOGY REPORT*  Clinical Data: Short of breath.  Cough.  History of lung cancer.  CT ANGIOGRAPHY CHEST  Technique:  Multidetector CT imaging of the chest using the standard protocol during bolus administration of intravenous contrast. Multiplanar reconstructed images including MIPs were obtained and reviewed to evaluate the vascular anatomy.  Comparison: 03/06/2011.  Findings: No acute aortic abnormality.  Incidental imaging of the upper abdomen is within normal limits.  Pericardial fluid is present.  Small left pleural effusion is present.  There is dense consolidation with air bronchograms in the superior segment of the left lower lobe and inferior left upper lobe.  Loculated pleural fluid is present on the left.  Consolidation may represent postradiation pneumonitis however superimposed infection is not excluded.  Diffusely through both lungs, there is a tree in bud micronodularity which is suspicious for bronchopneumonia.  Early the pulmonary edema can produce a similar appearance.  Infection is favored.  There is no pulmonary embolus identified.  Emphysema is present. No aggressive osseous lesions are present.  IMPRESSION: 1.  Postradiation changes in the superior segment  left lower lobe and inferior left upper lobe.  There is more confluent opacification in this region which may represent progressive postradiation changes or more likely pneumonia. 2.  Multi focal micronodularity and scattered areas of airspace opacity are most consistent with bronchopneumonia. 3.  Small left pleural effusion with loculation. 4.  Technically adequate study without pulmonary embolus.  No acute aortic abnormality  Original Report Authenticated By: Andreas Newport, M.D.   Dg Chest Port 1v Same Day  04/09/2011  *RADIOLOGY REPORT*  Clinical Data: Short of breath  PORTABLE CHEST - 1 VIEW SAME DAY  Comparison: Chest radiograph 04/06/2011, CT thorax 03/06/2011 the  Findings: Stable cardiac silhouette.  There is volume loss in the left hemithorax with tenting of the left hemidiaphragm.  There is a consolidative fibrosis in the left suprahilar region unchanged. There is increased fine nodular reticular pattern within the right lower lobe compared to prior.  No pneumothorax.  IMPRESSION:  1.  Increased reticular nodular pattern in the right lower lobe. This could represent atypical infection. 2.  Stable  postoperative and post therapy changes in the left lung.  Original Report Authenticated By: Genevive Bi, M.D.    IMPRESSION:  Principal Problem:  *COPD with acute exacerbation  Active Problems:  Troponin level elevated, r/o NSTEMI  Elevated brain natriuretic peptide (BNP) level, r/o CHF  Leukocytosis, from steroids vs pnuemonia  Lung cancer, s/p radiation and chemo Spring 2011  Hyponatremia  Anemia  Abnormal CXR  Rash, secondary to antibiotics   RECOMMENDATION: I discussed Mrs Menees's case with Dr Royann Shivers. It is possible she has pneumonia and CHF. NSTEMI seems unlikely but agree with IV Heparin. Will add ASA, hold IVF hydration and give Lasix IV once to see if she responds. We will follow.Dr Royann Shivers to see.  Time Spent Directly with Patient: 45 minutes  Corine Shelter  K 04/10/2011, 7:21 AM   I have seen and examined the patient along with Corine Shelter, PA.  I have reviewed the chart, notes and new data.  I agree with PA/NP's note.  Key new complaints: Dyspnea improved. No angina. Key examination changes: Loud rales and rhonchi in RLL otherwise no moist rales or wheezes. No S3 or murmurs or JVD. Key new findings / data: Marked BNP elevation and marginal increase in cTnI. Normal renal function. Hypercalcemia. Elevated WBC.  Overall clinical picture is more consistent with a pulmonary infection, consider influenza. BNP elevation does not "fit" but is also quite striking.  PLAN: An echocardiogram will go a long way to clarify whether there is any component of CHF or her clinical syndrome is entirely due to CPD with radiation pneumonitis and superimposed infection. The suspicion for acute coronary sd. is low. Invasive angiography is not planned at this time.  Thurmon Fair, MD, [facility] [phone] 04/10/2011, 7:45 AM

## 2011-04-10 NOTE — ED Notes (Signed)
Pt resting comfortable, cardiology pa at bedside, made aware of current cardiac enzymes. Will cont. To monitor

## 2011-04-10 NOTE — ED Notes (Signed)
Report given to ICU during report was told that she would call me back she needed to speak with her charge nurse, awaiting call back

## 2011-04-10 NOTE — ED Notes (Signed)
Claudia [name] made aware of Atrovent Tx at 2000, reports that she will be here to given tx

## 2011-04-10 NOTE — ED Notes (Signed)
Spoke with Claudia [name] in ICU reports that she spoke with Elray Mcgregor PA with [facility] reports that she was going to place order for admit to tele

## 2011-04-10 NOTE — ED Notes (Signed)
CRITICAL VALUE ALERT  Critical value received:   ckmb 7.7 troponin 0.42  Date of notification:   04/10/11  Time of notification:   0100  Critical value read back: yes   Nurse who received alert:  Pearly Bartosik rn  MD notified (1st page):  dr. Kara Pacer  Time of first page:  0100  MD notified (2nd page):    Time of second page:  Responding MD:   dr. Kara Pacer  Time MD responded:   [phone]

## 2011-04-10 NOTE — Progress Notes (Signed)
ANTICOAGULATION CONSULT NOTE - Initial Consult  Pharmacy Consult for Heparin Indication: ACS/STEMI  Allergies  Allergen Reactions  . Azithromycin Shortness Of Breath and Rash    Took Tussionex at same time Jan 2013.  . Tussionex Pennkinetic Er (Hydrocod Polst-Cpm Polst) Shortness Of Breath and Rash    Took Azithromycin at same time Jan 2013    Patient Measurements: Height: 5\' 2"  (157.5 cm) Weight: 127 lb (57.607 kg) IBW/kg (Calculated) : 50.1    Vital Signs: Temp: 98 F (36.7 C) (01/11 0103) Temp src: Oral (01/11 0103) BP: 123/66 mmHg (01/11 0103) Pulse Rate: 109  (01/11 0103)  Labs:  Basename 04/10/11 04/09/11 1800  HGB -- 11.0*  HCT -- 32.3*  PLT -- 276  APTT -- --  LABPROT -- --  INR -- --  HEPARINUNFRC -- --  CREATININE -- 0.91  CKTOTAL 45 --  CKMB 7.7* --  TROPONINI 0.42* --   Estimated Creatinine Clearance: 48.7 ml/min (by C-G formula based on Cr of 0.91).  Medical History: Past Medical History  Diagnosis Date  . COPD (chronic obstructive pulmonary disease)   . Anemia   . Lung cancer 2010    Dr. Arbutus Ped, finished chemo, sp radiation, left upper     Medications:  Scheduled:    . albuterol  5 mg Nebulization Once  . ipratropium  0.5 mg Nebulization Once  . methylPREDNISolone sodium succinate  125 mg Intravenous Once  . metoprolol tartrate  12.5 mg Oral QID  . moxifloxacin  400 mg Intravenous Once  . moxifloxacin  400 mg Intravenous Q24H  . sodium chloride  500 mL Intravenous Once   Infusions:    Assessment: 66 yo with history of lung Ca, admitted with COPD exacerbation now with elevated troponin. Goal of Therapy:  Heparin level 0.3-0.7 units/ml   Plan:  Draw baseline coags PT/Ptt before starting heparin. Heparin 3000 unit bolus IV x1 then start drip @ 700 units/hr (12 units/kg). Check CBC/Heparin level daily. Check 1st heparin level 6 hours after drip started.  Susanne Greenhouse R 04/10/2011,1:53 AM

## 2011-04-10 NOTE — ED Notes (Signed)
Pt returned from radiology, cont. To deny chest discomfort, iv heparin started, no comlplaints at time

## 2011-04-10 NOTE — ED Notes (Signed)
Attempted to call report x1 reports that the Rn will call back

## 2011-04-10 NOTE — Progress Notes (Signed)
ANTICOAGULATION CONSULT NOTE - Follow-Up Consult  Pharmacy Consult for Heparin Indication: r/o NSTEMI  Allergies  Allergen Reactions  . Azithromycin Shortness Of Breath and Rash    Took Tussionex at same time Jan 2013.  . Tussionex Pennkinetic Er (Hydrocod Polst-Cpm Polst) Shortness Of Breath and Rash    Took Azithromycin at same time Jan 2013    Patient Measurements: Height: 5\' 2"  (157.5 cm) Weight: 127 lb (57.607 kg) IBW/kg (Calculated) : 50.1    Vital Signs: Temp: 98 F (36.7 C) (01/11 0103) Temp src: Oral (01/11 0103) BP: 140/113 mmHg (01/11 0719) Pulse Rate: 110  (01/11 0719)  Labs:  Basename 04/10/11 1052 04/10/11 0443 04/10/11 0001 04/10/11 04/09/11 1800  HGB -- 10.9* -- -- 11.0*  HCT -- 32.0* -- -- 32.3*  PLT -- 315 -- -- 276  APTT -- -- 45* -- --  LABPROT -- -- 17.1* -- --  INR -- -- 1.37 -- --  HEPARINUNFRC <0.10* -- -- -- --  CREATININE -- 0.84 -- -- 0.91  CKTOTAL -- 49 -- 45 --  CKMB -- 8.7* -- 7.7* --  TROPONINI -- 0.63* -- 0.42* --   Estimated Creatinine Clearance: 52.8 ml/min (by C-G formula based on Cr of 0.84).  Medical History: Past Medical History  Diagnosis Date  . COPD (chronic obstructive pulmonary disease)   . Anemia   . Lung cancer 2010    Dr. Arbutus Ped, finished chemo, sp radiation, left upper     Medications:  Scheduled:     . albuterol  5 mg Nebulization Once  . aspirin  325 mg Oral Daily  . furosemide  40 mg Intravenous Once  . guaiFENesin  600 mg Oral BID  . heparin  3,000 Units Intravenous Once  . ipratropium  0.5 mg Nebulization Once  . ipratropium  0.5 mg Nebulization Q6H  . methylPREDNISolone sodium succinate  125 mg Intravenous Once  . methylPREDNISolone (SOLU-MEDROL) injection  60 mg Intravenous Q24H  . metoprolol tartrate  12.5 mg Oral QID  . moxifloxacin  400 mg Intravenous Once  . moxifloxacin  400 mg Intravenous Q24H  . pantoprazole  40 mg Oral Q0600  . sodium chloride  500 mL Intravenous Once  . sodium  chloride  500 mL Intravenous Once  . DISCONTD: enoxaparin  40 mg Subcutaneous Q24H   Infusions:     . sodium chloride Stopped (04/10/11 0938)  . heparin 12 Units/kg/hr (04/10/11 0759)    Assessment:  Heparin level undetectable on 700 units/hr.  No interruptions in IV per RN.    Plan:   Heparin 2000 units IV x1  Increase Heparin infusion to 950 units/hr  Recheck Heparin level in 6 hrs.  Elie Goody, Pharm.D.  [phone] 04/10/2011 11:48 AM

## 2011-04-10 NOTE — Progress Notes (Signed)
Subjective: Reports feeling slightly better.  Shortness of breath is improved.  Objective: Vital signs in last 24 hours: Filed Vitals:   04/10/11 0938 04/10/11 1214 04/10/11 1614 04/10/11 1634  BP:  129/67 120/70   Pulse:  100 102   Temp:  97.7 F (36.5 C) 97.6 F (36.4 C)   TempSrc:  Oral Oral   Resp:  20 20   Height:      Weight:      SpO2: 98% 100% 96% 100%   Weight change:  No intake or output data in the 24 hours ending 04/10/11 1703  Physical Exam: General: Awake, Oriented, No acute distress. HEENT: EOMI. Neck: Supple CV: S1 and S2 Lungs: Crackles at bases bilaterally. Abdomen: Soft, Nontender, Nondistended, +bowel sounds. Ext: Good pulses. Trace edema.  Lab Results:  [facility] 04/10/11 0443 04/09/11 1800  NA 133* 131*  K 4.3 4.1  CL 95* 93*  CO2 29 28  GLUCOSE 128* 142*  BUN 27* 33*  CREATININE 0.84 0.91  CALCIUM 10.9* 10.9*  MG 2.1 --  PHOS 2.7 --    Basename 04/10/11 0443  AST 15  ALT 14  ALKPHOS 123*  BILITOT 0.3  PROT 7.5  ALBUMIN 2.8*   No results found for this basename: LIPASE:2,AMYLASE:2 in the last 72 hours  Basename 04/10/11 0443 04/09/11 1800  WBC 23.0* 20.9*  NEUTROABS -- --  HGB 10.9* 11.0*  HCT 32.0* 32.3*  MCV 89.9 89.0  PLT 315 276    Basename 04/10/11 1050 04/10/11 0443 04/10/11  CKTOTAL 39 49 45  CKMB 7.6* 8.7* 7.7*  CKMBINDEX -- -- --  TROPONINI 0.45* 0.63* 0.42*   No components found with this basename: POCBNP:3 No results found for this basename: DDIMER:2 in the last 72 hours No results found for this basename: HGBA1C:2 in the last 72 hours No results found for this basename: CHOL:2,HDL:2,LDLCALC:2,TRIG:2,CHOLHDL:2,LDLDIRECT:2 in the last 72 hours  Basename 04/10/11 0443  TSH 0.147*  T4TOTAL --  T3FREE --  THYROIDAB --    Basename 04/10/11 0443  VITAMINB12 >2000*  FOLATE 12.6  FERRITIN 1680*  TIBC 159*  IRON 29*  RETICCTPCT 1.1    Micro Results: No results found for this or any previous visit (from  the past 240 hour(s)).  Studies/Results: Ct Angio Chest W/cm &/or Wo Cm  04/10/2011  *RADIOLOGY REPORT*  Clinical Data: Short of breath.  Cough.  History of lung cancer.  CT ANGIOGRAPHY CHEST  Technique:  Multidetector CT imaging of the chest using the standard protocol during bolus administration of intravenous contrast. Multiplanar reconstructed images including MIPs were obtained and reviewed to evaluate the vascular anatomy.  Comparison: 03/06/2011.  Findings: No acute aortic abnormality.  Incidental imaging of the upper abdomen is within normal limits.  Pericardial fluid is present.  Small left pleural effusion is present.  There is dense consolidation with air bronchograms in the superior segment of the left lower lobe and inferior left upper lobe.  Loculated pleural fluid is present on the left.  Consolidation may represent postradiation pneumonitis however superimposed infection is not excluded.  Diffusely through both lungs, there is a tree in bud micronodularity which is suspicious for bronchopneumonia.  Early the pulmonary edema can produce a similar appearance.  Infection is favored.  There is no pulmonary embolus identified.  Emphysema is present. No aggressive osseous lesions are present.  IMPRESSION: 1.  Postradiation changes in the superior segment left lower lobe and inferior left upper lobe.  There is more confluent opacification in this region  which may represent progressive postradiation changes or more likely pneumonia. 2.  Multi focal micronodularity and scattered areas of airspace opacity are most consistent with bronchopneumonia. 3.  Small left pleural effusion with loculation. 4.  Technically adequate study without pulmonary embolus.  No acute aortic abnormality  Original Report Authenticated By: Andreas Newport, M.D.   Dg Chest Port 1v Same Day  04/09/2011  *RADIOLOGY REPORT*  Clinical Data: Short of breath  PORTABLE CHEST - 1 VIEW SAME DAY  Comparison: Chest radiograph 04/06/2011, CT  thorax 03/06/2011 the  Findings: Stable cardiac silhouette.  There is volume loss in the left hemithorax with tenting of the left hemidiaphragm.  There is a consolidative fibrosis in the left suprahilar region unchanged. There is increased fine nodular reticular pattern within the right lower lobe compared to prior.  No pneumothorax.  IMPRESSION:  1.  Increased reticular nodular pattern in the right lower lobe. This could represent atypical infection. 2.  Stable  postoperative and post therapy changes in the left lung.  Original Report Authenticated By: Genevive Bi, M.D.    Medications: I have reviewed the patient's current medications. Scheduled Meds:   . albuterol  5 mg Nebulization Once  . aspirin  325 mg Oral Daily  . furosemide  40 mg Intravenous Once  . guaiFENesin  600 mg Oral BID  . heparin  2,000 Units Intravenous Once  . heparin  3,000 Units Intravenous Once  . ipratropium  0.5 mg Nebulization Once  . ipratropium  0.5 mg Nebulization Q6H  . methylPREDNISolone sodium succinate  125 mg Intravenous Once  . methylPREDNISolone (SOLU-MEDROL) injection  60 mg Intravenous Q24H  . metoprolol tartrate  12.5 mg Oral QID  . moxifloxacin  400 mg Intravenous Once  . moxifloxacin  400 mg Intravenous Q24H  . pantoprazole  40 mg Oral Q0600  . sodium chloride  500 mL Intravenous Once  . sodium chloride  500 mL Intravenous Once  . DISCONTD: enoxaparin  40 mg Subcutaneous Q24H   Continuous Infusions:   . sodium chloride Stopped (04/10/11 0938)  . heparin 950 Units/hr (04/10/11 1208)  . DISCONTD: heparin 12 Units/kg/hr (04/10/11 0759)   PRN Meds:.acetaminophen, acetaminophen, alum & mag hydroxide-simeth, diphenhydrAMINE, guaiFENesin-dextromethorphan, iohexol, levalbuterol, ondansetron (ZOFRAN) IV, ondansetron, DISCONTD: albuterol  Assessment/Plan: Acute respiratory failure, secondary to COPD exacerbation.  Continue breathing treatment.  Continue prednisone which was transitioned from  Solu-Medrol.  Continue levofloxacin, was on moxifloxacin yesterday.  Antibiotics since 04/09/2011.  2-D echocardiogram pending.  CT suggests pneumonia versus radiation pneumonitis.  Elevated troponin/non-ST elevation MI.  Cardiology, Dr. Royann Shivers, onboard 2-D echocardiogram done with results pending.  Patient on heparin drip.  Troponin pending down.  Continue telemetry.  Leukocytosis.  Likely due to steroids.  Hyponatremia.  Stable.  Question SIADH, given history of lung cancer.  Anemia.  Likely due to chronic disease and due to iron deficiency anemia.  Start supplemental iron at discharge.  Tachycardia.  Improved.  Prophylaxis.  Patient on a heparin drip.  CODE STATUS.  DO NOT RESUSCITATE/DO NOT INTUBATE    LOS: 1 day  Kamela Blansett A, MD 04/10/2011, 5:03 PM

## 2011-04-10 NOTE — ED Notes (Signed)
Attempted to give report to the floor was advised that Rn will call back

## 2011-04-10 NOTE — Progress Notes (Signed)
*  PRELIMINARY RESULTS* Echocardiogram 2D Echocardiogram has been performed.  Glean Salen [facility] 04/10/2011, 12:09 PM

## 2011-04-10 NOTE — ED Notes (Signed)
md notified of critical lab values, ekg obtained, orders received, pt cont. To deny chest discomfort, a and o, no complaints, radiology at bedside to take patient for ct angiogram.

## 2011-04-10 NOTE — Progress Notes (Signed)
ANTICOAGULATION CONSULT NOTE - Follow Up Consult  Pharmacy Consult for Heparin Indication: R/O NSTEMI  Allergies  Allergen Reactions  . Azithromycin Shortness Of Breath and Rash    Took Tussionex at same time Jan 2013.  . Tussionex Pennkinetic Er (Hydrocod Polst-Cpm Polst) Shortness Of Breath and Rash    Took Azithromycin at same time Jan 2013    Patient Measurements: Height: 5\' 2"  (157.5 cm) Weight: 127 lb (57.607 kg) IBW/kg (Calculated) : 50.1    Vital Signs: Temp: 98.9 F (37.2 C) (01/11 1945) Temp src: Oral (01/11 1945) BP: 137/80 mmHg (01/11 2148) Pulse Rate: 101  (01/11 2148)  Labs:  Basename 04/10/11 1940 04/10/11 1052 04/10/11 1050 04/10/11 0443 04/10/11 0001 04/09/11 1800  HGB -- -- -- 10.9* -- 11.0*  HCT -- -- -- 32.0* -- 32.3*  PLT -- -- -- 315 -- 276  APTT -- -- -- -- 45* --  LABPROT -- -- -- -- 17.1* --  INR -- -- -- -- 1.37 --  HEPARINUNFRC <0.10* <0.10* -- -- -- --  CREATININE -- -- -- 0.84 -- 0.91  CKTOTAL 31 -- 39 49 -- --  CKMB 5.5* -- 7.6* 8.7* -- --  TROPONINI <0.30 -- 0.45* 0.63* -- --   Estimated Creatinine Clearance: 52.8 ml/min (by C-G formula based on Cr of 0.84).   Medications:  Scheduled:    . aspirin  325 mg Oral Daily  . furosemide  40 mg Intravenous Once  . guaiFENesin  600 mg Oral BID  . heparin  2,000 Units Intravenous Once  . heparin  3,000 Units Intravenous Once  . ipratropium  0.5 mg Nebulization Q6H  . levofloxacin  750 mg Oral Daily  . metoprolol tartrate  12.5 mg Oral QID  . pantoprazole  40 mg Oral Q0600  . predniSONE  40 mg Oral Q breakfast  . sodium chloride  500 mL Intravenous Once  . DISCONTD: enoxaparin  40 mg Subcutaneous Q24H  . DISCONTD: methylPREDNISolone (SOLU-MEDROL) injection  60 mg Intravenous Q24H  . DISCONTD: moxifloxacin  400 mg Intravenous Q24H     Assessment: Heparin level undetectable on 950 units/hr. No IV line problems per RN. No bleeding reported.  Goal of Therapy:  Heparin level 0.3-0.7  units/ml   Plan:  1. Rebolus with heparin 2000 units IV x 1 2. Increase Heparin rate to 1150 units/hr 3. Recheck Heparin level in 6 hrs  Dorethea Clan 04/10/2011,10:03 PM

## 2011-04-10 NOTE — ED Notes (Signed)
Aquilla Solian from ICU came down to tcu to see pt reports that she is going to call the hospitalist and see if pt can go to tele

## 2011-04-11 LAB — HEPARIN LEVEL (UNFRACTIONATED)
Heparin Unfractionated: 0.14 IU/mL — ABNORMAL LOW (ref 0.30–0.70)
Heparin Unfractionated: 0.52 IU/mL (ref 0.30–0.70)
Heparin Unfractionated: 0.67 IU/mL (ref 0.30–0.70)

## 2011-04-11 LAB — CBC
HCT: 31.1 % — ABNORMAL LOW (ref 36.0–46.0)
Hemoglobin: 10.5 g/dL — ABNORMAL LOW (ref 12.0–15.0)
MCH: 30.2 pg (ref 26.0–34.0)
MCHC: 33.8 g/dL (ref 30.0–36.0)
MCV: 89.4 fL (ref 78.0–100.0)
Platelets: 319 10*3/uL (ref 150–400)
RBC: 3.48 MIL/uL — ABNORMAL LOW (ref 3.87–5.11)
RDW: 13.8 % (ref 11.5–15.5)
WBC: 22.5 10*3/uL — ABNORMAL HIGH (ref 4.0–10.5)

## 2011-04-11 LAB — BASIC METABOLIC PANEL
BUN: 38 mg/dL — ABNORMAL HIGH (ref 6–23)
CO2: 32 mEq/L (ref 19–32)
Calcium: 10.5 mg/dL (ref 8.4–10.5)
Chloride: 99 mEq/L (ref 96–112)
Creatinine, Ser: 1.06 mg/dL (ref 0.50–1.10)
GFR calc Af Amer: 62 mL/min — ABNORMAL LOW (ref >90)
GFR calc non Af Amer: 54 mL/min — ABNORMAL LOW (ref >90)
Glucose, Bld: 127 mg/dL — ABNORMAL HIGH (ref 70–99)
Potassium: 4 mEq/L (ref 3.5–5.1)
Sodium: 138 mEq/L (ref 135–145)

## 2011-04-11 LAB — PRO B NATRIURETIC PEPTIDE: Pro B Natriuretic peptide (BNP): 5761 pg/mL — ABNORMAL HIGH (ref 0–125)

## 2011-04-11 LAB — T4, FREE: Free T4: 1.08 ng/dL (ref 0.80–1.80)

## 2011-04-11 MED ORDER — HEPARIN SOD (PORCINE) IN D5W 100 UNIT/ML IV SOLN
1500.0000 [IU]/h | INTRAVENOUS | Status: DC
  Administered 2011-04-11 – 2011-04-12 (×3): 1500 [IU]/h via INTRAVENOUS
  Filled 2011-04-11 (×6): qty 250

## 2011-04-11 MED ORDER — IPRATROPIUM BROMIDE 0.02 % IN SOLN
0.5000 mg | Freq: Three times a day (TID) | RESPIRATORY_TRACT | Status: DC
  Administered 2011-04-12 – 2011-04-14 (×7): 0.5 mg via RESPIRATORY_TRACT
  Filled 2011-04-11 (×8): qty 2.5

## 2011-04-11 NOTE — Progress Notes (Signed)
[facility]  Subjective: Feels better, chest is "congested", so not "going home" better No CP, PND or orthopnea.  Objective: Vital signs in last 24 hours: Temp:  [97.6 F (36.4 C)-98.9 F (37.2 C)] 98.2 F (36.8 C) (01/12 0530) Pulse Rate:  [94-102] 94  (01/12 0530) Resp:  [20-26] 20  (01/12 0530) BP: (114-137)/(66-80) 125/77 mmHg (01/12 1047) SpO2:  [94 %-100 %] 95 % (01/12 0850) Weight:  [56.6 kg (124 lb 12.5 oz)] 56.6 kg (124 lb 12.5 oz) (01/11 2300)   General appearance: alert, cooperative, appears stated age and no distress Neck: no adenopathy, no carotid bruit, no JVD, supple, symmetrical, trachea midline and thyroid not enlarged, symmetric, no tenderness/mass/nodules Lungs: rhonchi bilaterally and diffuse - coarse Heart: regular rate and rhythm, S1, S2 normal, no murmur, click, rub or gallop, no rub and Non-displaced PMI. Abdomen: soft, non-tender; bowel sounds normal; no masses,  no organomegaly Extremities: extremities normal, atraumatic, no cyanosis or edema Pulses: 2+ and symmetric Neurologic: Grossly normal  Intake/Output from previous day:   Intake/Output this shift: Total I/O In: 240 [P.O.:240] Out: -   Medications Current Facility-Administered Medications  Medication Dose Route Frequency Provider Last Rate Last Dose  . 0.9 %  sodium chloride infusion   Intravenous Continuous Abelino Derrick, [place]      . acetaminophen (TYLENOL) tablet 650 mg  650 mg Oral Q6H PRN Therisa Doyne, MD       Or  . acetaminophen (TYLENOL) suppository 650 mg  650 mg Rectal Q6H PRN Therisa Doyne, MD      . alum & mag hydroxide-simeth (MAALOX/MYLANTA) 200-200-20 MG/5ML suspension 30 mL  30 mL Oral Q6H PRN Therisa Doyne, MD      . aspirin tablet 325 mg  325 mg Oral Daily Eda Paschal [place], [place]   325 mg at 04/11/11 1045  . diphenhydrAMINE (BENADRYL) capsule 25 mg  25 mg Oral Q6H PRN Therisa Doyne, MD      . guaiFENesin (MUCINEX) 12 hr tablet 600  mg  600 mg Oral BID Therisa Doyne, MD   600 mg at 04/11/11 1045  . guaiFENesin-dextromethorphan (ROBITUSSIN DM) 100-10 MG/5ML syrup 5 mL  5 mL Oral Q4H PRN Therisa Doyne, MD      . heparin ADULT infusion 100 units/ml (25000 units/250 ml)  1,500 Units/hr Intravenous Continuous Lorenza Evangelist, PHARMD 15 mL/hr at 04/11/11 0903 1,500 Units/hr at 04/11/11 0903  . heparin bolus via infusion 2,000 Units  2,000 Units Intravenous Once Srikar A Reddy   2,000 Units at 04/10/11 2224  . ipratropium (ATROVENT) nebulizer solution 0.5 mg  0.5 mg Nebulization Q6H Therisa Doyne, MD   0.5 mg at 04/11/11 0846  . levalbuterol (XOPENEX) nebulizer solution 0.63 mg  0.63 mg Nebulization Q4H PRN Therisa Doyne, MD      . levofloxacin (LEVAQUIN) tablet 750 mg  750 mg Oral Daily Srikar A Reddy   750 mg at 04/11/11 1045  . metoprolol tartrate (LOPRESSOR) tablet 12.5 mg  12.5 mg Oral QID Therisa Doyne, MD   12.5 mg at 04/10/11 2135  . ondansetron (ZOFRAN) tablet 4 mg  4 mg Oral Q6H PRN Therisa Doyne, MD       Or  . ondansetron (ZOFRAN) injection 4 mg  4 mg Intravenous Q6H PRN Therisa Doyne, MD      . pantoprazole (PROTONIX) EC tablet 40 mg  40 mg Oral Q0600 Abelino Derrick, PA   40 mg at 04/11/11 0552  . predniSONE (DELTASONE) tablet 40  mg  40 mg Oral Q breakfast Srikar A Reddy   40 mg at 04/11/11 0910  . sodium chloride 0.9 % bolus 500 mL  500 mL Intravenous Once Therisa Doyne, MD      . DISCONTD: heparin ADULT infusion 100 units/ml (25000 units/250 ml)  950 Units/hr Intravenous Continuous Randall K Absher, PHARMD   950 Units/hr at 04/10/11 1208  . DISCONTD: heparin ADULT infusion 100 units/ml (25000 units/250 ml)  1,150 Units/hr Intravenous Continuous Srikar A Reddy 11.5 mL/hr at 04/10/11 2223 1,150 Units/hr at 04/10/11 2223  . DISCONTD: methylPREDNISolone sodium succinate (SOLU-MEDROL) 125 MG injection 60 mg  60 mg Intravenous Q24H Therisa Doyne, MD   60 mg at 04/10/11 0421  .  DISCONTD: moxifloxacin (AVELOX) IVPB 400 mg  400 mg Intravenous Q24H Therisa Doyne, MD   400 mg at 04/09/11 2321    Lab Results:   Basename 04/11/11 0550 04/10/11 0443 04/09/11 1800  WBC 22.5* 23.0* 20.9*  HGB 10.5* 10.9* 11.0*  HCT 31.1* 32.0* 32.3*  PLT 319 315 276   BMET  Basename 04/11/11 0550 04/10/11 0443 04/09/11 1800  NA 138 133* 131*  K 4.0 4.3 4.1  CL 99 95* 93*  CO2 32 29 28  GLUCOSE 127* 128* 142*  BUN 38* 27* 33*  CREATININE 1.06 0.84 0.91  CALCIUM 10.5 10.9* 10.9*   PT/INR  Basename 04/10/11 0001  LABPROT 17.1*  INR 1.37    Studies/Results: 2D echo, 04/10/11 Study Conclusions - Left ventricle: Systolic function was normal. The estimated ejection fraction was in the range of 55% to 60%. Wall motion was normal; there were no regional wall motion abnormalities. Doppler parameters are consistent with abnormal left ventricular relaxation (grade 1 diastolic dysfunction). - Ventricular septum: Septal motion showed paradox. - Atrial septum: No defect or patent foramen ovale was identified. - Pericardium, extracardiac: A trivial pericardial effusion was identified. Impressions: Findings are not consistent with elevated intracardiac pressures or decompensated CHF.  Assessment/Plan  Principal Problem:  *COPD with acute exacerbation Active Problems:  Leukocytosis  Hyponatremia  Anemia  Abnormal CXR  Rash  Troponin level elevated  Lung cancer, s/p radiation and chemo Spring 2011  Elevated brain natriuretic peptide (BNP) level  Plan: Echo with normal EF and only grade one diastolic dysfunction.  Stable and elevated WBCs(prednisone).  HR 90-110. BP stable and controlled.  Troponin peak 0.63 and now normal.  Change lopressor to 37.5mg  bid.     LOS: 2 days    HAGER,BRYAN W 04/11/2011 1:23 PM  I have seen and examined the patient (my exam above).  I have reviewed the chart, notes and new data.  I agree with Bryan's note.  Key new complaints: breathing  improved, but not yet at baseline, feels "congested" Key examination changes: diffuse coarse rhonci with dullness to percussion throughout, not whet rales Key new findings / data: BNP trending down without diuresis. Echo reviewed - normal LV pressures & no WMA with normal EF.    As Dr. Royann Shivers noted - her low-level myocardiac necrosis by Troponin level & concomitant BNP elevation is most likely not ACS (plaque rupture) type of MI (type I MI) as there were no ECG changes, no angina (dyspnea in a pt with flu-like symptoms & history of lung disease is a stretch for anginal quivalent), and no Echocardiographic evidence of focal wall motion abnormality.  Most likely scenario is a a Type II MI (supply vs. Demand ischemia) owing to her dyspnea, increase WOB & likely tachycardiac +/- hypoxia.  The elevated BNP decreasing without diuresis & normal filling pressures on Echocardiogram would argue against acute CHF exaccerbation as the "causative" component.  In this setting, treating with anticoagulation is not indicated, however, we cannot be completely sure, so treating with Heparin / Lovenox for 48hrs is reasonable, as is adding ASA.  As she has had some myocardial necrosis - using cardioselective BB is reasonable - I agree with consolidating the dose & increasing the total dose.  She seems to be stable from a cardiac standpoint.  She can f/u with Dr. Royann Shivers @ [facility] as an OP to discuss possible non-invasive ischemic evaluation with myoview.  Call with questions.  Will sign off for now.  Marykay Lex, M.D., M.S. [facility] [address] [place], [place]  [address]  [phone]  04/11/2011 1:44 PM

## 2011-04-11 NOTE — Progress Notes (Signed)
ANTICOAGULATION CONSULT NOTE - Follow Up Consult  Pharmacy Consult for Heparin Indication:   Allergies  Allergen Reactions  . Azithromycin Shortness Of Breath and Rash    Took Tussionex at same time Jan 2013.  . Tussionex Pennkinetic Er (Hydrocod Polst-Cpm Polst) Shortness Of Breath and Rash    Took Azithromycin at same time Jan 2013    Patient Measurements: Height: 5\' 2"  (157.5 cm) Weight: 124 lb 12.5 oz (56.6 kg) IBW/kg (Calculated) : 50.1    Vital Signs: Temp: 98.2 F (36.8 C) (01/12 0530) Temp src: Oral (01/12 0530) BP: 114/66 mmHg (01/12 0530) Pulse Rate: 94  (01/12 0530)  Labs:  Basename 04/11/11 0550 04/10/11 1940 04/10/11 1052 04/10/11 1050 04/10/11 0443 04/10/11 0001 04/09/11 1800  HGB 10.5* -- -- -- 10.9* -- --  HCT 31.1* -- -- -- 32.0* -- 32.3*  PLT 319 -- -- -- 315 -- 276  APTT -- -- -- -- -- 45* --  LABPROT -- -- -- -- -- 17.1* --  INR -- -- -- -- -- 1.37 --  HEPARINUNFRC 0.14* <0.10* <0.10* -- -- -- --  CREATININE -- -- -- -- 0.84 -- 0.91  CKTOTAL -- 31 -- 39 49 -- --  CKMB -- 5.5* -- 7.6* 8.7* -- --  TROPONINI -- <0.30 -- 0.45* 0.63* -- --   Estimated Creatinine Clearance: 52.8 ml/min (by C-G formula based on Cr of 0.84).   Medications:  Scheduled:    . aspirin  325 mg Oral Daily  . furosemide  40 mg Intravenous Once  . guaiFENesin  600 mg Oral BID  . heparin  2,000 Units Intravenous Once  . heparin  2,000 Units Intravenous Once  . ipratropium  0.5 mg Nebulization Q6H  . levofloxacin  750 mg Oral Daily  . metoprolol tartrate  12.5 mg Oral QID  . pantoprazole  40 mg Oral Q0600  . predniSONE  40 mg Oral Q breakfast  . sodium chloride  500 mL Intravenous Once  . DISCONTD: methylPREDNISolone (SOLU-MEDROL) injection  60 mg Intravenous Q24H  . DISCONTD: moxifloxacin  400 mg Intravenous Q24H   Infusions:    . sodium chloride Stopped (04/10/11 0938)  . heparin 1,150 Units/hr (04/10/11 2223)  . DISCONTD: heparin Stopped (04/10/11 1916)  .  DISCONTD: heparin Stopped (04/10/11 2225)    Assessment: HL = 0.14 units/ml after rebolus and increasing drip to 1150 units/hr. Below goal. No bleeding/IV interuptions per RN. Increase heparin level to 1500 units/hr (15 ml/hr) Recheck in 6 hours.  Goal of Therapy:  Heparin level 0.3-0.7 units/ml   Plan:  Increase heparin level to 1500 units/hr (15 ml/hr) Recheck in 6 hours.    Susanne Greenhouse R 04/11/2011,6:48 AM

## 2011-04-11 NOTE — Progress Notes (Signed)
ANTICOAGULATION CONSULT NOTE - Follow Up Consult  Pharmacy Consult for Heparin Indication: for NSTEMI  Allergies  Allergen Reactions  . Azithromycin Shortness Of Breath and Rash    Took Tussionex at same time Jan 2013.  . Tussionex Pennkinetic Er (Hydrocod Polst-Cpm Polst) Shortness Of Breath and Rash    Took Azithromycin at same time Jan 2013    Patient Measurements: Height: 5\' 2"  (157.5 cm) Weight: 124 lb 12.5 oz (56.6 kg) IBW/kg (Calculated) : 50.1    Vital Signs: Temp: 98.5 F (36.9 C) (01/12 2108) Temp src: Oral (01/12 2108) BP: 115/70 mmHg (01/12 2108) Pulse Rate: 93  (01/12 2108)  Labs:  Basename 04/11/11 1859 04/11/11 1232 04/11/11 0550 04/10/11 1940 04/10/11 1050 04/10/11 0443 04/10/11 0001 04/09/11 1800  HGB -- -- 10.5* -- -- 10.9* -- --  HCT -- -- 31.1* -- -- 32.0* -- 32.3*  PLT -- -- 319 -- -- 315 -- 276  APTT -- -- -- -- -- -- 45* --  LABPROT -- -- -- -- -- -- 17.1* --  INR -- -- -- -- -- -- 1.37 --  HEPARINUNFRC 0.67 0.52 0.14* -- -- -- -- --  CREATININE -- -- 1.06 -- -- 0.84 -- 0.91  CKTOTAL -- -- -- 31 39 49 -- --  CKMB -- -- -- 5.5* 7.6* 8.7* -- --  TROPONINI -- -- -- <0.30 0.45* 0.63* -- --   Estimated Creatinine Clearance: 41.8 ml/min (by C-G formula based on Cr of 1.06).    Assessment: 66 yo F on IV heparin 1500 units/hour for NSTEMI Heparin level remains at goal. Will check with am labs. HL= 0.67   Goal of Therapy:  Heparin level 0.3-0.7   Plan:  1.) Continue heparin at 1500 units/hour (74ml/hr) 2.) Check level again in am with usual labs.  Loletta Specter 04/11/2011,10:02 PM

## 2011-04-11 NOTE — Progress Notes (Signed)
ANTICOAGULATION CONSULT NOTE - Follow Up Consult  Pharmacy Consult for Heparin Indication: NSTEMI  Allergies  Allergen Reactions  . Azithromycin Shortness Of Breath and Rash    Took Tussionex at same time Jan 2013.  . Tussionex Pennkinetic Er (Hydrocod Polst-Cpm Polst) Shortness Of Breath and Rash    Took Azithromycin at same time Jan 2013    Patient Measurements: Height: 5\' 2"  (157.5 cm) Weight: 124 lb 12.5 oz (56.6 kg) IBW/kg (Calculated) : 50.1    Vital Signs: Temp: 98.2 F (36.8 C) (01/12 0530) Temp src: Oral (01/12 0530) BP: 125/77 mmHg (01/12 1047) Pulse Rate: 94  (01/12 0530)  Labs:  Basename 04/11/11 1232 04/11/11 0550 04/10/11 1940 04/10/11 1050 04/10/11 0443 04/10/11 0001 04/09/11 1800  HGB -- 10.5* -- -- 10.9* -- --  HCT -- 31.1* -- -- 32.0* -- 32.3*  PLT -- 319 -- -- 315 -- 276  APTT -- -- -- -- -- 45* --  LABPROT -- -- -- -- -- 17.1* --  INR -- -- -- -- -- 1.37 --  HEPARINUNFRC 0.52 0.14* <0.10* -- -- -- --  CREATININE -- 1.06 -- -- 0.84 -- 0.91  CKTOTAL -- -- 31 39 49 -- --  CKMB -- -- 5.5* 7.6* 8.7* -- --  TROPONINI -- -- <0.30 0.45* 0.63* -- --   Estimated Creatinine Clearance: 41.8 ml/min (by C-G formula based on Cr of 1.06).   Medications:  Scheduled:    . aspirin  325 mg Oral Daily  . guaiFENesin  600 mg Oral BID  . heparin  2,000 Units Intravenous Once  . ipratropium  0.5 mg Nebulization Q6H  . levofloxacin  750 mg Oral Daily  . metoprolol tartrate  12.5 mg Oral QID  . pantoprazole  40 mg Oral Q0600  . predniSONE  40 mg Oral Q breakfast  . sodium chloride  500 mL Intravenous Once  . DISCONTD: methylPREDNISolone (SOLU-MEDROL) injection  60 mg Intravenous Q24H  . DISCONTD: moxifloxacin  400 mg Intravenous Q24H   Infusions:    . sodium chloride Stopped (04/10/11 0938)  . heparin 1,500 Units/hr (04/11/11 0903)  . DISCONTD: heparin Stopped (04/10/11 2225)  . DISCONTD: heparin 1,150 Units/hr (04/10/11 2223)   PRN: acetaminophen,  acetaminophen, alum & mag hydroxide-simeth, diphenhydrAMINE, guaiFENesin-dextromethorphan, levalbuterol, ondansetron (ZOFRAN) IV, ondansetron  Assessment:  66 yo F on IV heparin 1500 units/hr for NSTEMI  Heparin level now at goal, will repeat to assure goal range.   Bump in Scr today 1.06  (0.84) - will monitor  No bleeding reported, CBC stable.    Goal of Therapy:  Heparin level 0.3-0.7 units/ml   Plan:  1.) Continue heparin at 1500 units/hr (15 ml/hr) 2.) repeat level at 1900  Darlisha Kelm, Loma Messing PharmD 1:29 PM 04/11/2011

## 2011-04-11 NOTE — Progress Notes (Signed)
Subjective: Reports that her being continues to improve.  Feeling better overall.  Objective: Vital signs in last 24 hours: Filed Vitals:   04/11/11 0530 04/11/11 0850 04/11/11 1047 04/11/11 1404  BP: 114/66  125/77 112/66  Pulse: 94   89  Temp: 98.2 F (36.8 C)   98.2 F (36.8 C)  TempSrc: Oral   Oral  Resp: 20   22  Height:      Weight:      SpO2: 96% 95%  97%   Weight change: -1.007 kg (-2 lb 3.5 oz)  Intake/Output Summary (Last 24 hours) at 04/11/11 1539 Last data filed at 04/11/11 1300  Gross per 24 hour  Intake    440 ml  Output      0 ml  Net    440 ml    Physical Exam: General: Awake, Oriented, No acute distress. HEENT: EOMI. Neck: Supple CV: S1 and S2 Lungs: Crackles at bases bilaterally. Abdomen: Soft, Nontender, Nondistended, +bowel sounds. Ext: Good pulses. Trace edema.  Lab Results:  [facility] 04/11/11 0550 04/10/11 0443  NA 138 133*  K 4.0 4.3  CL 99 95*  CO2 32 29  GLUCOSE 127* 128*  BUN 38* 27*  CREATININE 1.06 0.84  CALCIUM 10.5 10.9*  MG -- 2.1  PHOS -- 2.7    Basename 04/10/11 0443  AST 15  ALT 14  ALKPHOS 123*  BILITOT 0.3  PROT 7.5  ALBUMIN 2.8*   No results found for this basename: LIPASE:2,AMYLASE:2 in the last 72 hours  Basename 04/11/11 0550 04/10/11 0443  WBC 22.5* 23.0*  NEUTROABS -- --  HGB 10.5* 10.9*  HCT 31.1* 32.0*  MCV 89.4 89.9  PLT 319 315    Basename 04/10/11 1940 04/10/11 1050 04/10/11 0443  CKTOTAL 31 39 49  CKMB 5.5* 7.6* 8.7*  CKMBINDEX -- -- --  TROPONINI <0.30 0.45* 0.63*   No components found with this basename: POCBNP:3 No results found for this basename: DDIMER:2 in the last 72 hours No results found for this basename: HGBA1C:2 in the last 72 hours No results found for this basename: CHOL:2,HDL:2,LDLCALC:2,TRIG:2,CHOLHDL:2,LDLDIRECT:2 in the last 72 hours  Basename 04/10/11 0443  TSH 0.147*  T4TOTAL --  T3FREE --  THYROIDAB --    Basename 04/10/11 0443  VITAMINB12 >2000*  FOLATE 12.6   FERRITIN 1680*  TIBC 159*  IRON 29*  RETICCTPCT 1.1    Micro Results: Recent Results (from the past 240 hour(s))  CULTURE, SPUTUM-ASSESSMENT     Status: Normal   Collection Time   04/10/11  3:00 PM      Component Value Range Status Comment   Specimen Description SPUTUM   Final    Special Requests Normal   Final    Sputum evaluation     Final    Value: THIS SPECIMEN IS ACCEPTABLE. RESPIRATORY CULTURE REPORT TO FOLLOW.   Report Status 04/10/2011 FINAL   Final   CULTURE, RESPIRATORY     Status: Normal (Preliminary result)   Collection Time   04/10/11  3:00 PM      Component Value Range Status Comment   Specimen Description SPUTUM   Final    Special Requests NONE   Final    Gram Stain     Final    Value: ABUNDANT WBC PRESENT,BOTH PMN AND MONONUCLEAR     RARE GRAM POSITIVE COCCI     IN PAIRS   Culture PENDING   Incomplete    Report Status PENDING   Incomplete  Studies/Results: Ct Angio Chest W/cm &/or Wo Cm  04/10/2011  *RADIOLOGY REPORT*  Clinical Data: Short of breath.  Cough.  History of lung cancer.  CT ANGIOGRAPHY CHEST  Technique:  Multidetector CT imaging of the chest using the standard protocol during bolus administration of intravenous contrast. Multiplanar reconstructed images including MIPs were obtained and reviewed to evaluate the vascular anatomy.  Comparison: 03/06/2011.  Findings: No acute aortic abnormality.  Incidental imaging of the upper abdomen is within normal limits.  Pericardial fluid is present.  Small left pleural effusion is present.  There is dense consolidation with air bronchograms in the superior segment of the left lower lobe and inferior left upper lobe.  Loculated pleural fluid is present on the left.  Consolidation may represent postradiation pneumonitis however superimposed infection is not excluded.  Diffusely through both lungs, there is a tree in bud micronodularity which is suspicious for bronchopneumonia.  Early the pulmonary edema can produce a  similar appearance.  Infection is favored.  There is no pulmonary embolus identified.  Emphysema is present. No aggressive osseous lesions are present.  IMPRESSION: 1.  Postradiation changes in the superior segment left lower lobe and inferior left upper lobe.  There is more confluent opacification in this region which may represent progressive postradiation changes or more likely pneumonia. 2.  Multi focal micronodularity and scattered areas of airspace opacity are most consistent with bronchopneumonia. 3.  Small left pleural effusion with loculation. 4.  Technically adequate study without pulmonary embolus.  No acute aortic abnormality  Original Report Authenticated By: Andreas Newport, M.D.   Dg Chest Port 1v Same Day  04/09/2011  *RADIOLOGY REPORT*  Clinical Data: Short of breath  PORTABLE CHEST - 1 VIEW SAME DAY  Comparison: Chest radiograph 04/06/2011, CT thorax 03/06/2011 the  Findings: Stable cardiac silhouette.  There is volume loss in the left hemithorax with tenting of the left hemidiaphragm.  There is a consolidative fibrosis in the left suprahilar region unchanged. There is increased fine nodular reticular pattern within the right lower lobe compared to prior.  No pneumothorax.  IMPRESSION:  1.  Increased reticular nodular pattern in the right lower lobe. This could represent atypical infection. 2.  Stable  postoperative and post therapy changes in the left lung.  Original Report Authenticated By: Genevive Bi, M.D.   Patient had a 2-D echocardiogram on 04/10/2011 Study Conclusions - Left ventricle: Systolic function was normal. The estimated ejection fraction was in the range of 55% to 60%. Wall motion was normal; there were no regional wall motion abnormalities. Doppler parameters are consistent with abnormal left ventricular relaxation (grade 1 diastolic dysfunction). - Ventricular septum: Septal motion showed paradox. - Atrial septum: No defect or patent foramen ovale was identified. -  Pericardium, extracardiac: A trivial pericardial effusion was identified. Impressions: - Findings are not consistent with elevated intracardiac pressures or decompensated CHF.  Medications: I have reviewed the patient's current medications. Scheduled Meds:    . aspirin  325 mg Oral Daily  . guaiFENesin  600 mg Oral BID  . heparin  2,000 Units Intravenous Once  . ipratropium  0.5 mg Nebulization Q6H  . levofloxacin  750 mg Oral Daily  . metoprolol tartrate  12.5 mg Oral QID  . pantoprazole  40 mg Oral Q0600  . predniSONE  40 mg Oral Q breakfast  . sodium chloride  500 mL Intravenous Once  . DISCONTD: methylPREDNISolone (SOLU-MEDROL) injection  60 mg Intravenous Q24H  . DISCONTD: moxifloxacin  400 mg Intravenous Q24H  Continuous Infusions:    . sodium chloride Stopped (04/10/11 0938)  . heparin 1,500 Units/hr (04/11/11 0903)  . DISCONTD: heparin Stopped (04/10/11 2225)  . DISCONTD: heparin 1,150 Units/hr (04/10/11 2223)   PRN Meds:.acetaminophen, acetaminophen, alum & mag hydroxide-simeth, diphenhydrAMINE, guaiFENesin-dextromethorphan, levalbuterol, ondansetron (ZOFRAN) IV, ondansetron  Assessment/Plan: 1. Acute respiratory failure, secondary to COPD exacerbation.  Continue breathing treatment.  Continue prednisone.  Continue levofloxacin.  Antibiotics since 04/09/2011.  2-D echocardiogram done was results as indicated above.  CT suggests pneumonia versus radiation pneumonitis.  Will lead a walking desaturation tomorrow to see if the patient would qualify for oxygen.   2. Elevated troponin/non-ST elevation MI.  Thought to be due to type II MI, secondary to dyspnea versus increased work of breathing.  Cardiology recommended continuing heparin until 04/12/2011 and discontinuing it to cover for at least 48 hours on anticoagulation.  Continue aspirin.  Patient will likely need to followup with Dr. Royann Shivers [facility] as outpatient to discuss possible noninvasive ischemic evaluation with  myocardial perfusion scan.  No events noted on telemetry.  Continue beta blocker.  Elevated BNP, patient does not have any signs suggestive of heart failure at this time.  3. Leukocytosis.  Likely due to steroids.  4. Hyponatremia.  Resolved.  Question SIADH, given history of lung cancer.  5. Anemia.  Likely due to chronic disease and due to iron deficiency anemia.  Start supplemental iron at discharge.  6. Tachycardia.  Improved.  7. Prophylaxis.  Patient on a heparin drip.  8. CODE STATUS.  DO NOT RESUSCITATE/DO NOT INTUBATE  9. Disposition.  Plan for discharge tomorrow if breathing continues to improve.   LOS: 2 days  Azalia Neuberger A, MD 04/11/2011, 3:39 PM

## 2011-04-12 LAB — CBC
HCT: 31 % — ABNORMAL LOW (ref 36.0–46.0)
Hemoglobin: 10.6 g/dL — ABNORMAL LOW (ref 12.0–15.0)
MCH: 31 pg (ref 26.0–34.0)
MCHC: 34.2 g/dL (ref 30.0–36.0)
MCV: 90.6 fL (ref 78.0–100.0)
Platelets: 297 10*3/uL (ref 150–400)
RBC: 3.42 MIL/uL — ABNORMAL LOW (ref 3.87–5.11)
RDW: 14 % (ref 11.5–15.5)
WBC: 19.2 10*3/uL — ABNORMAL HIGH (ref 4.0–10.5)

## 2011-04-12 LAB — BASIC METABOLIC PANEL
BUN: 32 mg/dL — ABNORMAL HIGH (ref 6–23)
CO2: 33 mEq/L — ABNORMAL HIGH (ref 19–32)
Calcium: 10 mg/dL (ref 8.4–10.5)
Chloride: 100 mEq/L (ref 96–112)
Creatinine, Ser: 1.04 mg/dL (ref 0.50–1.10)
GFR calc Af Amer: 64 mL/min — ABNORMAL LOW (ref >90)
GFR calc non Af Amer: 55 mL/min — ABNORMAL LOW (ref >90)
Glucose, Bld: 104 mg/dL — ABNORMAL HIGH (ref 70–99)
Potassium: 3.6 mEq/L (ref 3.5–5.1)
Sodium: 139 mEq/L (ref 135–145)

## 2011-04-12 LAB — HEPARIN LEVEL (UNFRACTIONATED): Heparin Unfractionated: 0.59 IU/mL (ref 0.30–0.70)

## 2011-04-12 NOTE — Progress Notes (Signed)
Subjective: Patient states that she feels a lot better. Presently she is still utilizing 1 L/min of oxygen. She is nontoxic-appearing. Hernia complaint is that she's had intermittent pain in the right lateral chest wall. However the pain is absent at this point and she is unable to reproduce the pain. The pain is not pleuritic it is nonradiating and it has not been there since awaking this morning. Objective: Filed Vitals:   04/12/11 1340 04/12/11 1431 04/12/11 1451 04/12/11 1700  BP: 126/71 114/69  122/74  Pulse: 108 87  91  Temp:  97.9 F (36.6 C)    TempSrc:  Oral    Resp: 20 20  20   Height:      Weight:      SpO2: 91% 93% 93% 92%   Weight change:   Intake/Output Summary (Last 24 hours) at 04/12/11 1806 Last data filed at 04/12/11 1700  Gross per 24 hour  Intake    660 ml  Output   2250 ml  Net  -1590 ml    General: Alert, awake, oriented x3, in no acute distress.  HEENT: [place]/AT PEERL, EOMI Neck: Trachea midline,  no masses, no thyromegal,y no JVD, no carotid bruit OROPHARYNX:  Moist, No exudate/ erythema/lesions.  Heart: Regular rate and rhythm, without murmurs, rubs, gallops, PMI non-displaced, no heaves or thrills on palpation.  Lungs: Clear to auscultation, no wheezing or rhonchi noted. No increased vocal fremitus resonant to percussion  Abdomen: Soft, nontender, nondistended, positive bowel sounds, no masses no hepatosplenomegaly noted..  Neuro: No focal neurological deficits noted cranial nerves II through XII grossly intact. DTRs 2+ bilaterally upper and lower extremities. Strength functional in bilateral upper and lower extremities. Musculoskeletal: No warm swelling or erythema around joints, no spinal tenderness noted. Psychiatric: Patient alert and oriented x3, good insight and cognition, good recent to remote recall. Lymph node survey: No cervical axillary or inguinal lymphadenopathy noted.     Lab Results:  Basename 04/12/11 0529 04/11/11 0550 04/10/11 0443    NA 139 138 --  K 3.6 4.0 --  CL 100 99 --  CO2 33* 32 --  GLUCOSE 104* 127* --  BUN 32* 38* --  CREATININE 1.04 1.06 --  CALCIUM 10.0 10.5 --  MG -- -- 2.1  PHOS -- -- 2.7    Basename 04/10/11 0443  AST 15  ALT 14  ALKPHOS 123*  BILITOT 0.3  PROT 7.5  ALBUMIN 2.8*   No results found for this basename: LIPASE:2,AMYLASE:2 in the last 72 hours  Basename 04/12/11 0529 04/11/11 0550  WBC 19.2* 22.5*  NEUTROABS -- --  HGB 10.6* 10.5*  HCT 31.0* 31.1*  MCV 90.6 89.4  PLT 297 319    Basename 04/10/11 1940 04/10/11 1050 04/10/11 0443  CKTOTAL 31 39 49  CKMB 5.5* 7.6* 8.7*  CKMBINDEX -- -- --  TROPONINI <0.30 0.45* 0.63*   No components found with this basename: POCBNP:3 No results found for this basename: DDIMER:2 in the last 72 hours No results found for this basename: HGBA1C:2 in the last 72 hours No results found for this basename: CHOL:2,HDL:2,LDLCALC:2,TRIG:2,CHOLHDL:2,LDLDIRECT:2 in the last 72 hours  Basename 04/10/11 0443  TSH 0.147*  T4TOTAL --  T3FREE --  THYROIDAB --    Basename 04/10/11 0443  VITAMINB12 >2000*  FOLATE 12.6  FERRITIN 1680*  TIBC 159*  IRON 29*  RETICCTPCT 1.1    Micro Results: Recent Results (from the past 240 hour(s))  CULTURE, SPUTUM-ASSESSMENT     Status: Normal   Collection Time  04/10/11  3:00 PM      Component Value Range Status Comment   Specimen Description SPUTUM   Final    Special Requests Normal   Final    Sputum evaluation     Final    Value: THIS SPECIMEN IS ACCEPTABLE. RESPIRATORY CULTURE REPORT TO FOLLOW.   Report Status 04/10/2011 FINAL   Final   CULTURE, RESPIRATORY     Status: Normal (Preliminary result)   Collection Time   04/10/11  3:00 PM      Component Value Range Status Comment   Specimen Description SPUTUM   Final    Special Requests NONE   Final    Gram Stain     Final    Value: ABUNDANT WBC PRESENT,BOTH PMN AND MONONUCLEAR     RARE GRAM POSITIVE COCCI     IN PAIRS   Culture MODERATE CANDIDA  ALBICANS   Final    Report Status PENDING   Incomplete     Studies/Results: Dg Chest 2 View  04/06/2011  *RADIOLOGY REPORT*  Clinical Data: Pneumonia, cough and fever.  CHEST - 2 VIEW  Comparison: CT chest 03/06/2011.  Findings: Trachea is midline.  Heart size normal.  Radiation fibrosis and volume loss are seen in the left perihilar region. Biapical pleural thickening, left greater than right.  No pleural fluid.  IMPRESSION: Radiation fibrosis and volume loss in the left hemithorax.  No acute findings.  Original Report Authenticated By: Reyes Ivan, M.D.   Ct Angio Chest W/cm &/or Wo Cm  04/10/2011  *RADIOLOGY REPORT*  Clinical Data: Short of breath.  Cough.  History of lung cancer.  CT ANGIOGRAPHY CHEST  Technique:  Multidetector CT imaging of the chest using the standard protocol during bolus administration of intravenous contrast. Multiplanar reconstructed images including MIPs were obtained and reviewed to evaluate the vascular anatomy.  Comparison: 03/06/2011.  Findings: No acute aortic abnormality.  Incidental imaging of the upper abdomen is within normal limits.  Pericardial fluid is present.  Small left pleural effusion is present.  There is dense consolidation with air bronchograms in the superior segment of the left lower lobe and inferior left upper lobe.  Loculated pleural fluid is present on the left.  Consolidation may represent postradiation pneumonitis however superimposed infection is not excluded.  Diffusely through both lungs, there is a tree in bud micronodularity which is suspicious for bronchopneumonia.  Early the pulmonary edema can produce a similar appearance.  Infection is favored.  There is no pulmonary embolus identified.  Emphysema is present. No aggressive osseous lesions are present.  IMPRESSION: 1.  Postradiation changes in the superior segment left lower lobe and inferior left upper lobe.  There is more confluent opacification in this region which may represent  progressive postradiation changes or more likely pneumonia. 2.  Multi focal micronodularity and scattered areas of airspace opacity are most consistent with bronchopneumonia. 3.  Small left pleural effusion with loculation. 4.  Technically adequate study without pulmonary embolus.  No acute aortic abnormality  Original Report Authenticated By: Andreas Newport, M.D.   Dg Chest Port 1v Same Day  04/09/2011  *RADIOLOGY REPORT*  Clinical Data: Short of breath  PORTABLE CHEST - 1 VIEW SAME DAY  Comparison: Chest radiograph 04/06/2011, CT thorax 03/06/2011 the  Findings: Stable cardiac silhouette.  There is volume loss in the left hemithorax with tenting of the left hemidiaphragm.  There is a consolidative fibrosis in the left suprahilar region unchanged. There is increased fine nodular reticular pattern within the  right lower lobe compared to prior.  No pneumothorax.  IMPRESSION:  1.  Increased reticular nodular pattern in the right lower lobe. This could represent atypical infection. 2.  Stable  postoperative and post therapy changes in the left lung.  Original Report Authenticated By: Genevive Bi, M.D.    Medications: I have reviewed the patient's current medications. Scheduled Meds:   . aspirin  325 mg Oral Daily  . guaiFENesin  600 mg Oral BID  . ipratropium  0.5 mg Nebulization TID  . levofloxacin  750 mg Oral Daily  . metoprolol tartrate  12.5 mg Oral QID  . pantoprazole  40 mg Oral Q0600  . predniSONE  40 mg Oral Q breakfast  . sodium chloride  500 mL Intravenous Once  . DISCONTD: ipratropium  0.5 mg Nebulization Q6H   Continuous Infusions:   . DISCONTD: heparin 1,500 Units/hr (04/12/11 1154)   PRN Meds:.acetaminophen, acetaminophen, alum & mag hydroxide-simeth, diphenhydrAMINE, guaiFENesin-dextromethorphan, levalbuterol, ondansetron (ZOFRAN) IV, ondansetron Assessment/Plan: Patient Active Hospital Problem List: COPD with acute exacerbation (04/09/2011)   Assessment: Cc to be  improving. The patient's oxygen requirement has decreased considerably and her work of breathing is normal. She'll continue on her prednisone for her breast a total of 5 days.    Leukocytosis (04/09/2011)   Assessment: Felt to be secondary to prednisone.    Hyponatremia (04/09/2011)   Assessment: Resolved     Anemia (04/09/2011)   Assessment: Chronic anemia stable.    Troponin level elevated (04/10/2011)   Assessment: Non-ST elevated MI versus demand ischemia. However recommendation from cardiology was to start the patient on aspirin and continue heparin for 2 days. I will as far as her to stop the heparin after 48 hours of initiation.     Lung cancer, s/p radiation and chemo Spring 2011 (04/10/2011)   Assessment: Defer to call    Elevated brain natriuretic peptide (BNP) level (04/10/2011)   Assessment: Felt to be spurious values the patient is improving without any diuresis.    Anticipate discharge home tomorrow   LOS: 3 days

## 2011-04-12 NOTE — Progress Notes (Signed)
Heparin was started 1/11 at 1208, Per MD consult order will d/c heparin now as it has run for 48 hours.   Thanks,  Jenavieve Freda, Loma Messing PharmD 1:21 PM 04/12/2011

## 2011-04-13 ENCOUNTER — Inpatient Hospital Stay (HOSPITAL_COMMUNITY): Payer: Medicare Other

## 2011-04-13 MED ORDER — METOPROLOL TARTRATE 12.5 MG HALF TABLET: 12.5000 mg | ORAL_TABLET | Freq: Four times a day (QID) | ORAL | Status: DC

## 2011-04-13 MED ORDER — LEVALBUTEROL HCL 0.63 MG/3ML IN NEBU: 0.6300 mg | INHALATION_SOLUTION | RESPIRATORY_TRACT | Status: DC | PRN

## 2011-04-13 MED ORDER — IOHEXOL 300 MG/ML  SOLN
100.0000 mL | Freq: Once | INTRAMUSCULAR | Status: AC | PRN
  Administered 2011-04-13: 80 mL via INTRAVENOUS

## 2011-04-13 MED ORDER — PANTOPRAZOLE SODIUM 40 MG PO TBEC: 40.0000 mg | DELAYED_RELEASE_TABLET | Freq: Every day | ORAL | Status: DC

## 2011-04-13 MED ORDER — IPRATROPIUM BROMIDE 0.02 % IN SOLN: 0.5000 mg | Freq: Three times a day (TID) | RESPIRATORY_TRACT | Status: DC

## 2011-04-13 MED ORDER — ASPIRIN 325 MG PO TABS: 325.0000 mg | ORAL_TABLET | Freq: Every day | ORAL | Status: DC

## 2011-04-13 MED ORDER — PREDNISONE 20 MG PO TABS: 40.0000 mg | ORAL_TABLET | Freq: Every day | ORAL | Status: AC

## 2011-04-13 MED ORDER — PREDNISONE 20 MG PO TABS: 40.0000 mg | ORAL_TABLET | Freq: Every day | ORAL | Status: DC

## 2011-04-13 MED ORDER — LEVOFLOXACIN 750 MG PO TABS: 750.0000 mg | ORAL_TABLET | Freq: Every day | ORAL | Status: AC

## 2011-04-13 NOTE — Progress Notes (Cosign Needed)
Subjective: "I feel better but they put me back on oxygen because my level was low this morning". Denies SOB/DOE or CP.  Objective: Vital signs Filed Vitals:   04/12/11 2149 04/13/11 0554 04/13/11 0844 04/13/11 0900  BP: 121/73 127/76    Pulse: 74 90    Temp: 98.8 F (37.1 C) 98.4 F (36.9 C)    TempSrc: Oral Oral    Resp: 16 18    Height:      Weight:      SpO2: 92% 91% 82% 94%   Weight change:     Intake/Output from previous day: 01/13 0701 - 01/14 0700 In: 480 [P.O.:480] Out: 1400 [Urine:1400] Total I/O In: 220 [P.O.:220] Out: -    Physical Exam: General: Alert, awake, oriented x3, in no acute distress. HEENT: No bruits, no goiter. Mucus membrane mouth moist/pink. PERRL Heart: Regular rate and rhythm, without murmurs, rubs, gallops. Lungs:Normal effort. Breath sounds with mild intermittent rhonchi in bases otherwise clear to auscultation bilaterally. No wheeze Abdomen: Soft, nontender, nondistended, positive bowel sounds. Extremities: No clubbing cyanosis or edema with positive pedal pulses. Neuro: Grossly intact, nonfocal. Speech clear    Lab Results: Basic Metabolic Panel:  Basename 04/12/11 0529 04/11/11 0550  NA 139 138  K 3.6 4.0  CL 100 99  CO2 33* 32  GLUCOSE 104* 127*  BUN 32* 38*  CREATININE 1.04 1.06  CALCIUM 10.0 10.5  MG -- --  PHOS -- --   Liver Function Tests: No results found for this basename: AST:2,ALT:2,ALKPHOS:2,BILITOT:2,PROT:2,ALBUMIN:2 in the last 72 hours No results found for this basename: LIPASE:2,AMYLASE:2 in the last 72 hours No results found for this basename: AMMONIA:2 in the last 72 hours CBC:  Basename 04/12/11 0529 04/11/11 0550  WBC 19.2* 22.5*  NEUTROABS -- --  HGB 10.6* 10.5*  HCT 31.0* 31.1*  MCV 90.6 89.4  PLT 297 319   Cardiac Enzymes:  Basename 04/10/11 1940 04/10/11 1050  CKTOTAL 31 39  CKMB 5.5* 7.6*  CKMBINDEX -- --  TROPONINI <0.30 0.45*   BNP:  Basename 04/11/11 0550  PROBNP 5761.0*    D-Dimer: No results found for this basename: DDIMER:2 in the last 72 hours CBG: No results found for this basename: GLUCAP:6 in the last 72 hours Hemoglobin A1C: No results found for this basename: HGBA1C in the last 72 hours Fasting Lipid Panel: No results found for this basename: CHOL,HDL,LDLCALC,TRIG,CHOLHDL,LDLDIRECT in the last 72 hours Thyroid Function Tests:  Basename 04/11/11 1541  TSH --  T4TOTAL --  FREET4 1.08  T3FREE --  THYROIDAB --   Anemia Panel: No results found for this basename: VITAMINB12,FOLATE,FERRITIN,TIBC,IRON,RETICCTPCT in the last 72 hours Coagulation: No results found for this basename: LABPROT:2,INR:2 in the last 72 hours Urine Drug Screen: Drugs of Abuse  No results found for this basename: labopia, cocainscrnur, labbenz, amphetmu, thcu, labbarb    Alcohol Level: No results found for this basename: ETH:2 in the last 72 hours Urinalysis:  Misc. Labs:  Recent Results (from the past 240 hour(s))  CULTURE, SPUTUM-ASSESSMENT     Status: Normal   Collection Time   04/10/11  3:00 PM      Component Value Range Status Comment   Specimen Description SPUTUM   Final    Special Requests Normal   Final    Sputum evaluation     Final    Value: THIS SPECIMEN IS ACCEPTABLE. RESPIRATORY CULTURE REPORT TO FOLLOW.   Report Status 04/10/2011 FINAL   Final   CULTURE, RESPIRATORY  Status: Normal   Collection Time   04/10/11  3:00 PM      Component Value Range Status Comment   Specimen Description SPUTUM   Final    Special Requests NONE   Final    Gram Stain     Final    Value: ABUNDANT WBC PRESENT,BOTH PMN AND MONONUCLEAR     RARE GRAM POSITIVE COCCI     IN PAIRS   Culture MODERATE CANDIDA ALBICANS   Final    Report Status 04/13/2011 FINAL   Final     Studies/Results: No results found.  Medications: Scheduled Meds:   . aspirin  325 mg Oral Daily  . guaiFENesin  600 mg Oral BID  . ipratropium  0.5 mg Nebulization TID  . levofloxacin  750 mg  Oral Daily  . metoprolol tartrate  12.5 mg Oral QID  . pantoprazole  40 mg Oral Q0600  . predniSONE  40 mg Oral Q breakfast  . sodium chloride  500 mL Intravenous Once   Continuous Infusions:   . DISCONTD: heparin Stopped (04/12/11 1814)   PRN Meds:.acetaminophen, acetaminophen, alum & mag hydroxide-simeth, diphenhydrAMINE, guaiFENesin-dextromethorphan, levalbuterol, ondansetron (ZOFRAN) IV, ondansetron  Assessment/Plan:  Principal Problem:  COPD with acute exacerbation (04/09/2011) Assessment:  o2 sat 88% early this am. O2 reapplied. Pt asymptomatic.  Continue breathing treatment and prednisone day #3/5. Will check saturation with ambulation  Leukocytosis (04/09/2011) Assessment: Felt to be secondary to prednisone.  Trending down. Afebrile non toxic appearing.   Hyponatremia (04/09/2011) Assessment: Resolved   Anemia (04/09/2011) Assessment: Chronic anemia stable.  Troponin level elevated (04/10/2011) Assessment: Non-ST elevated MI versus demand ischemia. However recommendation from cardiology was to start the patient on aspirin and continue heparin for 2 days. Heparin for 48hrs. OP follow up with cardiology   Lung cancer, s/p radiation and chemo Spring 2011 (04/10/2011) Assessment: Defer to call   Elevated brain natriuretic peptide (BNP) level (04/10/2011) Assessment: Felt to be spurious values the patient is improving without any diuresis.   Dispo: probable discharge this afternoon once O2 sats rechecked.     LOS: 4 days   [facility] M 04/13/2011, 10:20 AM

## 2011-04-13 NOTE — Progress Notes (Signed)
Nursing: Pt walked in hallway on RA, Sats sustained at 87%. Pt denies any SOB.  Audrie Lia

## 2011-04-13 NOTE — Progress Notes (Signed)
Please see notes dictated by NP Toya Smothers. Patient had episode of hypoxemia this morning. She has some persistent hypoxemia with ambulation. Her chest x-ray shows improvement rather than worsening this makes me concerned about a possible pulmonary embolus in this patient. Rule out pulmonary embolus if infection does not have one the patient will be discharged home with supplemental oxygen.

## 2011-04-13 NOTE — Discharge Summary (Signed)
Physician Discharge Summary  Patient ID: Claudia [name] MRN: [id number] DOB/AGE: 1946-02-15 66 y.o.  Admit date: 04/09/2011 Discharge date: 04/13/2011  Primary Care Physician:  No primary provider on file.   Discharge Diagnoses:    Present on Admission:  .COPD with acute exacerbation .Leukocytosis .Hyponatremia .Anemia .Abnormal CXR .Rash .Troponin level elevated .Lung cancer, s/p radiation and chemo Spring 2011 .Elevated brain natriuretic peptide (BNP) level  Current Discharge Medication List    START taking these medications   Details  aspirin 325 MG tablet Take 1 tablet (325 mg total) by mouth daily. Qty: 20 tablet, Refills: 0    ipratropium (ATROVENT) 0.02 % nebulizer solution Take 2.5 mLs (0.5 mg total) by nebulization 3 (three) times daily. Qty: 75 mL, Refills: 0    levalbuterol (XOPENEX) 0.63 MG/3ML nebulizer solution Take 3 mLs (0.63 mg total) by nebulization every 4 (four) hours as needed for wheezing. Qty: 3 mL, Refills: 0    levofloxacin (LEVAQUIN) 750 MG tablet Take 1 tablet (750 mg total) by mouth daily. Qty: 4 tablet, Refills: 0    metoprolol tartrate (LOPRESSOR) 12.5 mg TABS Take 0.5 tablets (12.5 mg total) by mouth 4 (four) times daily. Qty: 60 tablet, Refills: 0    pantoprazole (PROTONIX) 40 MG tablet Take 1 tablet (40 mg total) by mouth daily at 6 (six) AM. Qty: 30 tablet, Refills: 0    predniSONE (DELTASONE) 20 MG tablet Take 2 tablets (40 mg total) by mouth daily with breakfast. Qty: 4 tablet, Refills: 0      CONTINUE these medications which have NOT CHANGED   Details  diphenhydrAMINE (BENADRYL) 25 MG tablet Take 25 mg by mouth every 6 (six) hours as needed. Allergic reaction    fish oil-omega-3 fatty acids 1000 MG capsule Take 1 g by mouth daily.      STOP taking these medications     methylPREDNISolone (MEDROL DOSEPAK) 4 MG tablet          Disposition and Follow-up: pt is medically stable and ready for discharge to  home  Consults:  cardiology    Significant Diagnostic Studies:  Ct Angio Chest W/cm &/or Wo Cm  04/10/2011  *RADIOLOGY REPORT*  Clinical Data: Short of breath.  Cough.  History of lung cancer.  CT ANGIOGRAPHY CHEST  Technique:  Multidetector CT imaging of the chest using the standard protocol during bolus administration of intravenous contrast. Multiplanar reconstructed images including MIPs were obtained and reviewed to evaluate the vascular anatomy.  Comparison: 03/06/2011.  Findings: No acute aortic abnormality.  Incidental imaging of the upper abdomen is within normal limits.  Pericardial fluid is present.  Small left pleural effusion is present.  There is dense consolidation with air bronchograms in the superior segment of the left lower lobe and inferior left upper lobe.  Loculated pleural fluid is present on the left.  Consolidation may represent postradiation pneumonitis however superimposed infection is not excluded.  Diffusely through both lungs, there is a tree in bud micronodularity which is suspicious for bronchopneumonia.  Early the pulmonary edema can produce a similar appearance.  Infection is favored.  There is no pulmonary embolus identified.  Emphysema is present. No aggressive osseous lesions are present.  IMPRESSION: 1.  Postradiation changes in the superior segment left lower lobe and inferior left upper lobe.  There is more confluent opacification in this region which may represent progressive postradiation changes or more likely pneumonia. 2.  Multi focal micronodularity and scattered areas of airspace opacity are most consistent with bronchopneumonia. 3.  Small left pleural effusion with loculation. 4.  Technically adequate study without pulmonary embolus.  No acute aortic abnormality  Original Report Authenticated By: Andreas Newport, M.D.   Dg Chest Port 1v Same Day  04/09/2011  *RADIOLOGY REPORT*  Clinical Data: Short of breath  PORTABLE CHEST - 1 VIEW SAME DAY  Comparison: Chest  radiograph 04/06/2011, CT thorax 03/06/2011 the  Findings: Stable cardiac silhouette.  There is volume loss in the left hemithorax with tenting of the left hemidiaphragm.  There is a consolidative fibrosis in the left suprahilar region unchanged. There is increased fine nodular reticular pattern within the right lower lobe compared to prior.  No pneumothorax.  IMPRESSION:  1.  Increased reticular nodular pattern in the right lower lobe. This could represent atypical infection. 2.  Stable  postoperative and post therapy changes in the left lung.  Original Report Authenticated By: Genevive Bi, M.D.    Labs Reviewed  PRO B NATRIURETIC PEPTIDE - Abnormal; Notable for the following:    Pro B Natriuretic peptide (BNP) 13257.0 (*)    All other components within normal limits  BASIC METABOLIC PANEL - Abnormal; Notable for the following:    Sodium 131 (*)    Chloride 93 (*)    Glucose, Bld 142 (*)    BUN 33 (*)    Calcium 10.9 (*)    GFR calc non Af Amer 65 (*)    GFR calc Af Amer 75 (*)    All other components within normal limits  CBC - Abnormal; Notable for the following:    WBC 20.9 (*)    RBC 3.63 (*)    Hemoglobin 11.0 (*)    HCT 32.3 (*)    All other components within normal limits  POCT I-STAT TROPONIN I - Abnormal; Notable for the following:    Troponin i, poc 0.32 (*)    All other components within normal limits  CARDIAC PANEL(CRET KIN+CKTOT+MB+TROPI) - Abnormal; Notable for the following:    CK, MB 7.7 (*)    Troponin I 0.42 (*)    All other components within normal limits  CARDIAC PANEL(CRET KIN+CKTOT+MB+TROPI) - Abnormal; Notable for the following:    CK, MB 8.7 (*) CRITICAL VALUE NOTED.  VALUE IS CONSISTENT WITH PREVIOUSLY REPORTED AND CALLED VALUE.   Troponin I 0.63 (*)    All other components within normal limits  URINALYSIS, ROUTINE W REFLEX MICROSCOPIC - Abnormal; Notable for the following:    Hgb urine dipstick SMALL (*)    Protein, ur 30 (*)    All other components  within normal limits  OSMOLALITY, URINE - Abnormal; Notable for the following:    Osmolality, Ur 358 (*)    All other components within normal limits  APTT - Abnormal; Notable for the following:    aPTT 45 (*)    All other components within normal limits  PROTIME-INR - Abnormal; Notable for the following:    Prothrombin Time 17.1 (*)    All other components within normal limits  TSH - Abnormal; Notable for the following:    TSH 0.147 (*)    All other components within normal limits  COMPREHENSIVE METABOLIC PANEL - Abnormal; Notable for the following:    Sodium 133 (*)    Chloride 95 (*)    Glucose, Bld 128 (*)    BUN 27 (*)    Calcium 10.9 (*)    Albumin 2.8 (*)    Alkaline Phosphatase 123 (*)    GFR calc non Af Amer 71 (*)  GFR calc Af Amer 83 (*)    All other components within normal limits  CBC - Abnormal; Notable for the following:    WBC 23.0 (*)    RBC 3.56 (*)    Hemoglobin 10.9 (*)    HCT 32.0 (*)    All other components within normal limits  VITAMIN B12 - Abnormal; Notable for the following:    Vitamin B-12 >2000 (*)    All other components within normal limits  IRON AND TIBC - Abnormal; Notable for the following:    Iron 29 (*)    TIBC 159 (*)    Saturation Ratios 18 (*)    All other components within normal limits  RETICULOCYTES - Abnormal; Notable for the following:    RBC. 3.56 (*)    All other components within normal limits  FERRITIN - Abnormal; Notable for the following:    Ferritin 1680 (*) Result confirmed by automatic dilution.   All other components within normal limits  CARDIAC PANEL(CRET KIN+CKTOT+MB+TROPI) - Abnormal; Notable for the following:    CK, MB 5.5 (*)    All other components within normal limits  CARDIAC PANEL(CRET KIN+CKTOT+MB+TROPI) - Abnormal; Notable for the following:    CK, MB 7.6 (*) CRITICAL VALUE NOTED.  VALUE IS CONSISTENT WITH PREVIOUSLY REPORTED AND CALLED VALUE.   Troponin I 0.45 (*)    All other components within  normal limits  HEPARIN LEVEL (UNFRACTIONATED) - Abnormal; Notable for the following:    Heparin Unfractionated <0.10 (*)    All other components within normal limits  HEPARIN LEVEL (UNFRACTIONATED) - Abnormal; Notable for the following:    Heparin Unfractionated <0.10 (*)    All other components within normal limits  HEPARIN LEVEL (UNFRACTIONATED) - Abnormal; Notable for the following:    Heparin Unfractionated 0.14 (*)    All other components within normal limits  PRO B NATRIURETIC PEPTIDE - Abnormal; Notable for the following:    Pro B Natriuretic peptide (BNP) 5761.0 (*)    All other components within normal limits  CBC - Abnormal; Notable for the following:    WBC 22.5 (*)    RBC 3.48 (*)    Hemoglobin 10.5 (*)    HCT 31.1 (*)    All other components within normal limits  BASIC METABOLIC PANEL - Abnormal; Notable for the following:    Glucose, Bld 127 (*)    BUN 38 (*)    GFR calc non Af Amer 54 (*)    GFR calc Af Amer 62 (*)    All other components within normal limits  CBC - Abnormal; Notable for the following:    WBC 19.2 (*)    RBC 3.42 (*)    Hemoglobin 10.6 (*)    HCT 31.0 (*)    All other components within normal limits  BASIC METABOLIC PANEL - Abnormal; Notable for the following:    CO2 33 (*)    Glucose, Bld 104 (*)    BUN 32 (*)    GFR calc non Af Amer 55 (*)    GFR calc Af Amer 64 (*)    All other components within normal limits  LACTIC ACID, PLASMA  PROCALCITONIN  NA AND K (SODIUM & POTASSIUM), RAND UR  URINE MICROSCOPIC-ADD ON  MAGNESIUM  PHOSPHORUS  FOLATE  CULTURE, SPUTUM-ASSESSMENT  CULTURE, RESPIRATORY  HEPARIN LEVEL (UNFRACTIONATED)  HEPARIN LEVEL (UNFRACTIONATED)  T4, FREE  HEPARIN LEVEL (UNFRACTIONATED)  I-STAT TROPONIN I  HEPARIN LEVEL (UNFRACTIONATED)  Dg Chest 2 View  04/13/2011  *RADIOLOGY REPORT*  Clinical Data: Short of breath.  Cough.  CHEST - 2 VIEW  Comparison: 04/09/2011.  04/06/2011.  Multiple previous.  Findings: Post  radiation changes in the left hilar region however turning to a baseline appearance.  This suggests that there has been near complete resolution of the pneumonia and volume loss recently demonstrated.  No pleural effusion. No worsening or new findings.  IMPRESSION: Marked radiographic improvement.  Near complete resolution of the left perihilar infiltrate and volume loss.  Near baseline appearance of the chronic postradiation changes.  Original Report Authenticated By: Thomasenia Sales, M.D.   Dg Chest 2 View  04/06/2011  *RADIOLOGY REPORT*  Clinical Data: Pneumonia, cough and fever.  CHEST - 2 VIEW  Comparison: CT chest 03/06/2011.  Findings: Trachea is midline.  Heart size normal.  Radiation fibrosis and volume loss are seen in the left perihilar region. Biapical pleural thickening, left greater than right.  No pleural fluid.  IMPRESSION: Radiation fibrosis and volume loss in the left hemithorax.  No acute findings.  Original Report Authenticated By: Reyes Ivan, M.D.   Ct Angio Chest W/cm &/or Wo Cm  04/10/2011  *RADIOLOGY REPORT*  Clinical Data: Short of breath.  Cough.  History of lung cancer.  CT ANGIOGRAPHY CHEST  Technique:  Multidetector CT imaging of the chest using the standard protocol during bolus administration of intravenous contrast. Multiplanar reconstructed images including MIPs were obtained and reviewed to evaluate the vascular anatomy.  Comparison: 03/06/2011.  Findings: No acute aortic abnormality.  Incidental imaging of the upper abdomen is within normal limits.  Pericardial fluid is present.  Small left pleural effusion is present.  There is dense consolidation with air bronchograms in the superior segment of the left lower lobe and inferior left upper lobe.  Loculated pleural fluid is present on the left.  Consolidation may represent postradiation pneumonitis however superimposed infection is not excluded.  Diffusely through both lungs, there is a tree in bud micronodularity which  is suspicious for bronchopneumonia.  Early the pulmonary edema can produce a similar appearance.  Infection is favored.  There is no pulmonary embolus identified.  Emphysema is present. No aggressive osseous lesions are present.  IMPRESSION: 1.  Postradiation changes in the superior segment left lower lobe and inferior left upper lobe.  There is more confluent opacification in this region which may represent progressive postradiation changes or more likely pneumonia. 2.  Multi focal micronodularity and scattered areas of airspace opacity are most consistent with bronchopneumonia. 3.  Small left pleural effusion with loculation. 4.  Technically adequate study without pulmonary embolus.  No acute aortic abnormality  Original Report Authenticated By: Andreas Newport, M.D.   Dg Chest Port 1v Same Day  04/09/2011  *RADIOLOGY REPORT*  Clinical Data: Short of breath  PORTABLE CHEST - 1 VIEW SAME DAY  Comparison: Chest radiograph 04/06/2011, CT thorax 03/06/2011 the  Findings: Stable cardiac silhouette.  There is volume loss in the left hemithorax with tenting of the left hemidiaphragm.  There is a consolidative fibrosis in the left suprahilar region unchanged. There is increased fine nodular reticular pattern within the right lower lobe compared to prior.  No pneumothorax.  IMPRESSION:  1.  Increased reticular nodular pattern in the right lower lobe. This could represent atypical infection. 2.  Stable  postoperative and post therapy changes in the left lung.  Original Report Authenticated By: Genevive Bi, M.D.       Brief H and P:  For complete details please refer to admission H and P, but in brief   Claudia [name] is a 65 y.o. female  has a past medical history of Lung cancer (2010); COPD (chronic obstructive pulmonary disease); and Anemia.  Presented with  1 week history of increased coughing, sore throat. On Monday was seen by PCP and was given cough medicine and steroid taper. She had no improvement.  She have never used inhalers or nebulizer in the past. Not sure if she had any fever. Have had increased wheezing. No chest pain. The shortness of breath started yesterday and became severe rapidly. No leg swelling. She did have a recent trip to the beach.  She took a bath yesterday and noted the rash on her abdomen. She was admitted to telemetry for further eval.     Hospital Course:  No resolved problems to display.  Active Hospital Problems  Diagnoses Date Noted   . COPD with acute exacerbation 04/09/2011   . Troponin level elevated 04/10/2011   . Lung cancer, s/p radiation and chemo Spring 2011 04/10/2011   . Elevated brain natriuretic peptide (BNP) level 04/10/2011   . Leukocytosis 04/09/2011   . Hyponatremia 04/09/2011   . Anemia 04/09/2011   . Abnormal CXR 04/09/2011   . Rash 04/09/2011     Resolved Hospital Problems  Diagnoses Date Noted Date Resolved    PrPrincipal Problem:  COPD with acute exacerbation (04/09/2011) Assessment: o2 sat 88% early this am. O2 reapplied. O2 sat 86% room air with ambulation.  Pt asymptomatic. Repeat chest xray indicates improvement. Will get CT chest as concern for PE .  Continue breathing treatment and prednisone day #3/5. If CT angio rules out PE may be discharged this evening.   Leukocytosis (04/09/2011) Assessment: Felt to be secondary to prednisone. Trending down. Afebrile non toxic appearing.   Hyponatremia (04/09/2011) Assessment: Resolved   Anemia (04/09/2011) Assessment: Chronic anemia stable.  Troponin level elevated (04/10/2011): Pt seen by cardiology.  The assessment included Non-ST elevated MI versus demand ischemia. However recommendation from cardiology was to start the patient on aspirin and continue heparin for 2 days. Heparin for 48hrs. This was done and pt will need OP follow up with cardiology . Has apt 1/29 with Dr. Royann Shivers  Lung cancer, s/p radiation and chemo Spring 2011 (04/10/2011) OP follow up     Elevated brain  natriuretic peptide (BNP) level (04/10/2011)  Felt to be spurious values the patient is improving without any diuresis. 2 decho as above.       Time spent on Discharge: 40 minutes  Signed: Gwenyth Bender 04/13/2011, 4:04 PM

## 2011-04-14 DIAGNOSIS — J189 Pneumonia, unspecified organism: Secondary | ICD-10-CM | POA: Clinically undetermined

## 2011-04-14 NOTE — Progress Notes (Signed)
Was told in report that patient oxygen saturation dropped yesterday, 1/14, in the 80s while ambulating in the hall.  Patient mentioned possible discharge today so I wanted to ambulate her on RA and see how she did.  She tolerated ambulating very well and oxygen levels stayed between 92-96% on RA.  Once patient returned to room, I kept the patient on RA and told her that I would spot check her periodically to make sure she doesn't desat.  Patient was agreeable to this and I instructed her that if she felt SOB to apply the oxygen.  Will continue to monitor oxygen saturation. Linden Tagliaferro, Joslyn Devon

## 2011-04-14 NOTE — Progress Notes (Cosign Needed)
Subjective: "I am ready to go home when you say i can". Denies SOB/DOE. No events during the night  Objective: Vital signs Filed Vitals:   04/14/11 0500 04/14/11 0848 04/14/11 1010 04/14/11 1015  BP: 111/67     Pulse: 76     Temp: 98.5 F (36.9 C)     TempSrc: Oral     Resp: 16     Height:      Weight:      SpO2: 97% 97% 98% 95%   Weight change:  Last BM Date: 04/12/11  Intake/Output from previous day: 01/14 0701 - 01/15 0700 In: 580 [P.O.:580] Out: 1100 [Urine:1100]     Physical Exam: General: Alert, awake, oriented x3, in no acute distress. Sitting up in bed visiting with family. Smiling HEENT: No bruits, no goiter. PERRL Heart: Regular rate and rhythm, without murmurs, rubs, gallops. Lungs: Normal effort. Breath sounds clear to auscultation bilaterally. No wheeze Abdomen: Soft, nontender, nondistended, positive bowel sounds. Extremities: No clubbing cyanosis or edema with positive pedal pulses. Neuro: Grossly intact, nonfocal. Speech clear. Facial symmetry.    Lab Results: Basic Metabolic Panel:  Basename 04/12/11 0529  NA 139  K 3.6  CL 100  CO2 33*  GLUCOSE 104*  BUN 32*  CREATININE 1.04  CALCIUM 10.0  MG --  PHOS --   Liver Function Tests: No results found for this basename: AST:2,ALT:2,ALKPHOS:2,BILITOT:2,PROT:2,ALBUMIN:2 in the last 72 hours No results found for this basename: LIPASE:2,AMYLASE:2 in the last 72 hours No results found for this basename: AMMONIA:2 in the last 72 hours CBC:  Basename 04/12/11 0529  WBC 19.2*  NEUTROABS --  HGB 10.6*  HCT 31.0*  MCV 90.6  PLT 297   Cardiac Enzymes: No results found for this basename: CKTOTAL:3,CKMB:3,CKMBINDEX:3,TROPONINI:3 in the last 72 hours BNP: No results found for this basename: PROBNP:3 in the last 72 hours D-Dimer: No results found for this basename: DDIMER:2 in the last 72 hours CBG: No results found for this basename: GLUCAP:6 in the last 72 hours Hemoglobin A1C: No results  found for this basename: HGBA1C in the last 72 hours Fasting Lipid Panel: No results found for this basename: CHOL,HDL,LDLCALC,TRIG,CHOLHDL,LDLDIRECT in the last 72 hours Thyroid Function Tests:  Basename 04/11/11 1541  TSH --  T4TOTAL --  FREET4 1.08  T3FREE --  THYROIDAB --   Anemia Panel: No results found for this basename: VITAMINB12,FOLATE,FERRITIN,TIBC,IRON,RETICCTPCT in the last 72 hours Coagulation: No results found for this basename: LABPROT:2,INR:2 in the last 72 hours Urine Drug Screen: Drugs of Abuse  No results found for this basename: labopia, cocainscrnur, labbenz, amphetmu, thcu, labbarb    Alcohol Level: No results found for this basename: ETH:2 in the last 72 hours Urinalysis:  Misc. Labs:  Recent Results (from the past 240 hour(s))  CULTURE, SPUTUM-ASSESSMENT     Status: Normal   Collection Time   04/10/11  3:00 PM      Component Value Range Status Comment   Specimen Description SPUTUM   Final    Special Requests Normal   Final    Sputum evaluation     Final    Value: THIS SPECIMEN IS ACCEPTABLE. RESPIRATORY CULTURE REPORT TO FOLLOW.   Report Status 04/10/2011 FINAL   Final   CULTURE, RESPIRATORY     Status: Normal   Collection Time   04/10/11  3:00 PM      Component Value Range Status Comment   Specimen Description SPUTUM   Final    Special Requests NONE   Final  Gram Stain     Final    Value: ABUNDANT WBC PRESENT,BOTH PMN AND MONONUCLEAR     RARE GRAM POSITIVE COCCI     IN PAIRS   Culture MODERATE CANDIDA ALBICANS   Final    Report Status 04/13/2011 FINAL   Final     Studies/Results: Dg Chest 2 View  04/13/2011  *RADIOLOGY REPORT*  Clinical Data: Short of breath.  Cough.  CHEST - 2 VIEW  Comparison: 04/09/2011.  04/06/2011.  Multiple previous.  Findings: Post radiation changes in the left hilar region however turning to a baseline appearance.  This suggests that there has been near complete resolution of the pneumonia and volume loss recently  demonstrated.  No pleural effusion. No worsening or new findings.  IMPRESSION: Marked radiographic improvement.  Near complete resolution of the left perihilar infiltrate and volume loss.  Near baseline appearance of the chronic postradiation changes.  Original Report Authenticated By: Thomasenia Sales, M.D.   Ct Angio Chest W/cm &/or Wo Cm  04/13/2011  *RADIOLOGY REPORT*  Clinical Data: Persistent shortness of breath.  CT ANGIOGRAPHY CHEST  Technique:  Multidetector CT imaging of the chest using the standard protocol during bolus administration of intravenous contrast. Multiplanar reconstructed images including MIPs were obtained and reviewed to evaluate the vascular anatomy.  Contrast: 80mL OMNIPAQUE IOHEXOL 300 MG/ML IV SOLN  Comparison: CT chest 04/10/2011  Findings: The chest wall is unremarkable and stable.  No breast masses, supraclavicular or axillary adenopathy.  The bony thorax is intact.  The heart is normal in size.  Stable loculated pericardial fluid collections.  Stable radiation changes involving the left paramediastinal lung and hilum.  No mediastinal or hilar lymphadenopathy.  No findings to suggest recurrent tumor.  The aorta is normal in caliber.  No dissection.  The esophagus is grossly normal.  The pulmonary arterial tree is well opacified.  No filling defects to suggest pulmonary emboli.  Examination of the lung parenchyma demonstrates stable emphysematous changes and areas of scarring and atelectasis. Stable scattered right-sided pulmonary nodules and peripheral airspace nodularity.  No pulmonary edema.  Stable left-sided pleural effusion and loculated left pleural fluid collections.  The upper abdomen is unremarkable and stable.  IMPRESSION:  1.  No CT findings for pulmonary embolism. 2.  Normal thoracic aorta. 3.  Stable postoperative and postradiation changes involving the left hemithorax. 4.  Stable small scattered pulmonary nodules. 5.  Stable loculated left-sided pleural fluid  collections. 6.  Stable emphysematous changes.  Original Report Authenticated By: P. Loralie Champagne, M.D.    Medications: Scheduled Meds:   . aspirin  325 mg Oral Daily  . guaiFENesin  600 mg Oral BID  . ipratropium  0.5 mg Nebulization TID  . levofloxacin  750 mg Oral Daily  . metoprolol tartrate  12.5 mg Oral QID  . pantoprazole  40 mg Oral Q0600  . predniSONE  40 mg Oral Q breakfast  . sodium chloride  500 mL Intravenous Once   Continuous Infusions:  PRN Meds:.acetaminophen, acetaminophen, alum & mag hydroxide-simeth, diphenhydrAMINE, guaiFENesin-dextromethorphan, iohexol, levalbuterol, ondansetron (ZOFRAN) IV, ondansetron  Assessment/Plan:  Principal Problem: COPD with acute exacerbation (04/09/2011) Assessment: Oxygen saturation level 86% on room air with ambulation yesterday. Pt asymptomatic. CT angio neg for PE, but did yield loculated pleural effusion. Pt's . Oxygen saturation level 02-96% on room air with ambulation today.Prednisone day #4/5. Pulmonary consult requested.   Leukocytosis (04/09/2011)  Felt to be secondary to prednisone.  Afebrile non toxic appearing.   Hyponatremia (04/09/2011) Resolved  Anemia (04/09/2011) Chronic anemia stable.   Troponin level elevated (04/10/2011) Non-ST elevated MI versus demand ischemia. However recommendation from cardiology was to start the patient on aspirin and provide 48hrs. Heparin. Heparin discontinued. OP follow up with cardiology   Lung cancer, s/p radiation and chemo Spring 2011 (04/10/2011) Defer to call   Elevated brain natriuretic peptide (BNP) level (04/10/2011) Felt to be spurious values the patient is improving without any diuresis.   Disposition: discharge to home after evaluated by pulmonology if they agree.       LOS: 5 days   [facility] M 04/14/2011, 12:35 PM

## 2011-04-14 NOTE — Discharge Summary (Signed)
Addendum to discharge summary dated 04/13/11   Physical exam: see progress note dated 04/14/11  Assessment/Plan:  COPD with acute exacerbation (04/09/2011) Assessment: Oxygen saturation level 86% on room air with ambulation yesterday. Pt asymptomatic. CT angio neg for PE, but did yield loculated pleural effusion. Pt's . Oxygen saturation level 02-96% on room air with ambulation today.Prednisone day #4/5. Pulmonary follow up on OP basis.   Leukocytosis (04/09/2011) Felt to be secondary to prednisone. Afebrile non toxic appearing.   Hyponatremia (04/09/2011) Resolved   Anemia (04/09/2011) Chronic anemia stable.  Troponin level elevated (04/10/2011) Non-ST elevated MI versus demand ischemia. However recommendation from cardiology was to start the patient on aspirin and provide 48hrs. Heparin. Heparin discontinued. OP follow up with cardiology   Lung cancer, s/p radiation and chemo Spring 2011 (04/10/2011) Defer to call   Elevated brain natriuretic peptide (BNP) level (04/10/2011) Felt to be spurious values the patient is improving without any diuresis.   Disposition: discharge to home. Pt medically stable and ready for discharge to home. Will follow up with Dr. Delton Coombes pulmonary 04/30/11 and Dr. Royann Shivers 04/28/11.

## 2011-04-23 NOTE — Discharge Summary (Signed)
Pt see and examined and discussed with NP Toya Smothers. Agree with A&P.

## 2011-04-23 NOTE — Discharge Summary (Signed)
Pt seen and examined and discussed with NP Toya Smothers. D/C held as pt continues to have unexplained hypoxia. Will reassess in am and confer with Pulmonology.

## 2011-04-30 ENCOUNTER — Ambulatory Visit (INDEPENDENT_AMBULATORY_CARE_PROVIDER_SITE_OTHER): Payer: Medicare Other | Admitting: Emergency Medicine

## 2011-04-30 ENCOUNTER — Encounter: Payer: Self-pay | Admitting: Emergency Medicine

## 2011-04-30 DIAGNOSIS — C3490 Malignant neoplasm of unspecified part of unspecified bronchus or lung: Secondary | ICD-10-CM

## 2011-04-30 DIAGNOSIS — J441 Chronic obstructive pulmonary disease with (acute) exacerbation: Secondary | ICD-10-CM

## 2011-04-30 NOTE — Progress Notes (Signed)
Subjective:    Patient ID: Claudia [name], female    DOB: March 09, 1946, 66 y.o.   MRN: [id number]  HPI 66 yo former smoker with a history of COPD and small cell lung cancer diagnosed by Dr. Edwyna Shell in June 2001 and treated by Dr. Shirline Frees and Dr. Kathrynn Running. She was admitted to the hospital in early January 2013 for an exacerbation of COPD vs CAP. Her CT scan of the chest revealed no pulmonary embolism, radiation changes and scarring in the left upper lobe, stable pulmonary nodularity as detailed below, and some left sided loculated pleural fluid. She presents today as new consult. She denies any current breathing difficulty. She gets serial CT scans through Dr Shirline Frees. She was weak after discharge, now feels at baseline. She usually walks daily, does yoga. She is able to exert.    Review of Systems  Constitutional: Negative.  Negative for fever and unexpected weight change.  HENT: Positive for congestion. Negative for ear pain, nosebleeds, sore throat, rhinorrhea, sneezing, trouble swallowing, dental problem, postnasal drip and sinus pressure.   Eyes: Negative.  Negative for redness and itching.  Respiratory: Negative.  Negative for cough, chest tightness, shortness of breath and wheezing.   Cardiovascular: Negative.  Negative for palpitations and leg swelling.  Gastrointestinal: Negative.  Negative for nausea and vomiting.  Genitourinary: Negative.  Negative for dysuria.  Musculoskeletal: Negative.  Negative for joint swelling.  Skin: Negative.  Negative for rash.  Neurological: Negative.  Negative for headaches.  Hematological: Negative.  Does not bruise/bleed easily.  Psychiatric/Behavioral: Negative.  Negative for dysphoric mood. The patient is not nervous/anxious.     Past Medical History  Diagnosis Date  . COPD (chronic obstructive pulmonary disease)   . Anemia   . Lung cancer 2010    Dr. Arbutus Ped, finished chemo, sp radiation, left upper      Family History  Problem Relation Age  of Onset  . Brain cancer Mother     brain cancer  . Diabetes Son   . Heart disease Paternal Grandfather   . Breast cancer Maternal Aunt   . Emphysema Father      History   Social History  . Marital Status: Married    Spouse Name: N/A    Number of Children: N/A  . Years of Education: N/A   Occupational History  . clerical [employer]   Social History Main Topics  . Smoking status: Former Smoker -- 1.0 packs/day for 35 years    Types: Cigarettes    Quit date: 03/31/2007  . Smokeless tobacco: Never Used  . Alcohol Use: Yes     occassional  . Drug Use: No  . Sexually Active: No   Other Topics Concern  . Not on file   Social History Narrative  . No narrative on file     Allergies  Allergen Reactions  . Azithromycin Shortness Of Breath and Rash    Took Tussionex at same time Jan 2013.  . Tussionex Pennkinetic Er (Chlorpheniramine-Hydrocodone) Shortness Of Breath and Rash    Took Azithromycin at same time Jan 2013     Outpatient Prescriptions Prior to Visit  Medication Sig Dispense Refill  . aspirin 325 MG tablet Take 1 tablet (325 mg total) by mouth daily.  20 tablet  0  . fish oil-omega-3 fatty acids 1000 MG capsule Take 1 g by mouth daily.      . metoprolol tartrate (LOPRESSOR) 12.5 mg TABS Take 0.5 tablets (12.5 mg total) by mouth 4 (four) times daily.  60 tablet  0  . pantoprazole (PROTONIX) 40 MG tablet Take 1 tablet (40 mg total) by mouth daily at 6 (six) AM.  30 tablet  0  . diphenhydrAMINE (BENADRYL) 25 MG tablet Take 25 mg by mouth every 6 (six) hours as needed. Allergic reaction            Objective:   Physical Exam  Gen: Pleasant, well-nourished, in no distress,  normal affect  ENT: No lesions,  mouth clear,  oropharynx clear, no postnasal drip  Neck: No JVD, no TMG, no carotid bruits  Lungs: No use of accessory muscles, no dullness to percussion, clear without rales or rhonchi  Cardiovascular: RRR, heart sounds normal, no murmur or gallops, no  peripheral edema  Musculoskeletal: No deformities, no cyanosis or clubbing  Neuro: alert, non focal  Skin: Warm, no lesions or rashes      Assessment & Plan:  Small cell lung cancer Followed by Drs Arbutus Ped and Kathrynn Running. She is undergoing serial CTs that are being followed in oncology after her chemotherapy and radiation therapy. Most recent CT scan during her hospital admission in January was stable  COPD with acute exacerbation Recent admission for probable acute exacerbation of COPD versus CAP. She is now completely back to baseline without any dyspnea or other symptoms of ongoing COPD. She would like to defer pulmonary function testing at this time. We will not start bronchodilators for now. She will followup with me in 6 months to assess her progress. If she declines any way then we will perform PFTs and consider bronchodilators.

## 2011-04-30 NOTE — Assessment & Plan Note (Signed)
Followed by Drs Arbutus Ped and Kathrynn Running. She is undergoing serial CTs that are being followed in oncology after her chemotherapy and radiation therapy. Most recent CT scan during her hospital admission in January was stable

## 2011-04-30 NOTE — Assessment & Plan Note (Signed)
Recent admission for probable acute exacerbation of COPD versus CAP. She is now completely back to baseline without any dyspnea or other symptoms of ongoing COPD. She would like to defer pulmonary function testing at this time. We will not start bronchodilators for now. She will followup with me in 6 months to assess her progress. If she declines any way then we will perform PFTs and consider bronchodilators.

## 2011-04-30 NOTE — Patient Instructions (Signed)
We will not do any testing or start any inhaled medications at this time.  Follow with Dr Delton Coombes in 6 months or sooner if you have any problems

## 2011-08-05 ENCOUNTER — Other Ambulatory Visit (HOSPITAL_BASED_OUTPATIENT_CLINIC_OR_DEPARTMENT_OTHER): Payer: Medicare Other | Admitting: Lab

## 2011-08-05 ENCOUNTER — Ambulatory Visit (HOSPITAL_COMMUNITY)
Admission: RE | Admit: 2011-08-05 | Discharge: 2011-08-05 | Disposition: A | Discharge status: Home / Self Care | Payer: Medicare Other | Source: Ambulatory Visit | Attending: Internal Medicine | Admitting: Internal Medicine

## 2011-08-05 DIAGNOSIS — J90 Pleural effusion, not elsewhere classified: Secondary | ICD-10-CM | POA: Insufficient documentation

## 2011-08-05 DIAGNOSIS — C3490 Malignant neoplasm of unspecified part of unspecified bronchus or lung: Secondary | ICD-10-CM | POA: Insufficient documentation

## 2011-08-05 LAB — CBC WITH DIFFERENTIAL/PLATELET
BASO%: 0.6 % (ref 0.0–2.0)
Basophils Absolute: 0 10*3/uL (ref 0.0–0.1)
EOS%: 1.8 % (ref 0.0–7.0)
Eosinophils Absolute: 0.1 10*3/uL (ref 0.0–0.5)
HCT: 36.6 % (ref 34.8–46.6)
HGB: 12.2 g/dL (ref 11.6–15.9)
LYMPH%: 19.9 % (ref 14.0–49.7)
MCH: 29.8 pg (ref 25.1–34.0)
MCHC: 33.3 g/dL (ref 31.5–36.0)
MCV: 89.5 fL (ref 79.5–101.0)
MONO#: 0.5 10*3/uL (ref 0.1–0.9)
MONO%: 9.2 % (ref 0.0–14.0)
NEUT#: 3.4 10*3/uL (ref 1.5–6.5)
NEUT%: 68.5 % (ref 38.4–76.8)
Platelets: 180 10*3/uL (ref 145–400)
RBC: 4.09 10*6/uL (ref 3.70–5.45)
RDW: 13.4 % (ref 11.2–14.5)
WBC: 5 10*3/uL (ref 3.9–10.3)
lymph#: 1 10*3/uL (ref 0.9–3.3)

## 2011-08-05 LAB — CMP (CANCER CENTER ONLY)
ALT(SGPT): 16 U/L (ref 10–47)
AST: 19 U/L (ref 11–38)
Albumin: 3.6 g/dL (ref 3.3–5.5)
Alkaline Phosphatase: 69 U/L (ref 26–84)
BUN, Bld: 13 mg/dL (ref 7–22)
CO2: 30 mEq/L (ref 18–33)
Calcium: 9.4 mg/dL (ref 8.0–10.3)
Chloride: 98 mEq/L (ref 98–108)
Creat: 1 mg/dl (ref 0.6–1.2)
Glucose, Bld: 99 mg/dL (ref 73–118)
Potassium: 4.2 mEq/L (ref 3.3–4.7)
Sodium: 140 mEq/L (ref 128–145)
Total Bilirubin: 0.7 mg/dl (ref 0.20–1.60)
Total Protein: 6.9 g/dL (ref 6.4–8.1)

## 2011-08-05 MED ORDER — IOHEXOL 300 MG/ML  SOLN
80.0000 mL | Freq: Once | INTRAMUSCULAR | Status: AC | PRN
  Administered 2011-08-05: 80 mL via INTRAVENOUS

## 2011-08-10 ENCOUNTER — Telehealth: Payer: Self-pay | Admitting: Internal Medicine

## 2011-08-10 ENCOUNTER — Ambulatory Visit (HOSPITAL_BASED_OUTPATIENT_CLINIC_OR_DEPARTMENT_OTHER): Payer: Medicare Other | Admitting: Internal Medicine

## 2011-08-10 VITALS — BP 125/64 | HR 61 | Temp 97.5°F | Ht 62.0 in | Wt 132.4 lb

## 2011-08-10 DIAGNOSIS — C3490 Malignant neoplasm of unspecified part of unspecified bronchus or lung: Secondary | ICD-10-CM

## 2011-08-10 DIAGNOSIS — C3410 Malignant neoplasm of upper lobe, unspecified bronchus or lung: Secondary | ICD-10-CM

## 2011-08-10 NOTE — Telephone Encounter (Signed)
appts made and printed for pt aom °

## 2011-08-10 NOTE — Progress Notes (Signed)
[facility] Telephone:(336) [phone]   Fax:([phone]  OFFICE PROGRESS NOTE  Elie Confer, MD, MD [address] [place] [address]  PRINCIPAL DIAGNOSIS: Limited stage small cell lung cancer diagnosed in June 2011.   PRIOR THERAPY:  1. Status post 4 cycles of systemic chemotherapy with carboplatin and etoposide. Last dose was given 11/14/2009. This was concurrent with radiotherapy. 2. Status post prophylactic cranial irradiation completed January 28, 2010.  CURRENT THERAPY: Observation.  INTERVAL HISTORY: Claudia [name] 66 y.o. female returns to the clinic today four month followup visit. The patient has no complaints today. She denied having any significant chest pain or shortness breath, no cough or hemoptysis. She has no weight loss or night sweats. The patient has repeat CT scan of the chest performed recently and she is here today for evaluation and discussion of her scan results.  MEDICAL HISTORY: Past Medical History  Diagnosis Date  . COPD (chronic obstructive pulmonary disease)   . Anemia   . Lung cancer 2010    Dr. Arbutus Ped, finished chemo, sp radiation, left upper     ALLERGIES:  is allergic to azithromycin and tussionex pennkinetic er.  MEDICATIONS:  Current Outpatient Prescriptions  Medication Sig Dispense Refill  . aspirin 325 MG tablet Take 81 mg by mouth daily.      . fish oil-omega-3 fatty acids 1000 MG capsule Take 1 g by mouth daily.      . Melatonin 3 MG CAPS Take by mouth daily.      . metoprolol tartrate (LOPRESSOR) 12.5 mg TABS Take 12.5 mg by mouth 2 (two) times daily.      Marland Kitchen DISCONTD: metoprolol tartrate (LOPRESSOR) 12.5 mg TABS Take 0.5 tablets (12.5 mg total) by mouth 4 (four) times daily.  60 tablet  0  . pantoprazole (PROTONIX) 40 MG tablet Take 1 tablet (40 mg total) by mouth daily at 6 (six) AM.  30 tablet  0    SURGICAL HISTORY:  Past Surgical History  Procedure Date  . Neck surgery     REVIEW OF  SYSTEMS:  A comprehensive review of systems was negative.   PHYSICAL EXAMINATION: General appearance: alert, cooperative and no distress Head: Normocephalic, without obvious abnormality, atraumatic Neck: no adenopathy Lymph nodes: Cervical, supraclavicular, and axillary nodes normal. Resp: clear to auscultation bilaterally Cardio: regular rate and rhythm, S1, S2 normal, no murmur, click, rub or gallop GI: soft, non-tender; bowel sounds normal; no masses,  no organomegaly Extremities: extremities normal, atraumatic, no cyanosis or edema Neurologic: Alert and oriented X 3, normal strength and tone. Normal symmetric reflexes. Normal coordination and gait  ECOG PERFORMANCE STATUS: 0 - Asymptomatic  Blood pressure 125/64, pulse 61, temperature 97.5 F (36.4 C), temperature source Oral, height 5\' 2"  (1.575 m), weight 132 lb 6.4 oz (60.056 kg).  LABORATORY DATA: Lab Results  Component Value Date   WBC 5.0 08/05/2011   HGB 12.2 08/05/2011   HCT 36.6 08/05/2011   MCV 89.5 08/05/2011   PLT 180 08/05/2011      Chemistry      Component Value Date/Time   NA 140 08/05/2011 0823   NA 139 04/12/2011 0529   K 4.2 08/05/2011 0823   K 3.6 04/12/2011 0529   CL 98 08/05/2011 0823   CL 100 04/12/2011 0529   CO2 30 08/05/2011 0823   CO2 33* 04/12/2011 0529   BUN 13 08/05/2011 0823   BUN 32* 04/12/2011 0529   CREATININE 1.0 08/05/2011 9562  CREATININE 1.04 04/12/2011 0529      Component Value Date/Time   CALCIUM 9.4 08/05/2011 0823   CALCIUM 10.0 04/12/2011 0529   ALKPHOS 69 08/05/2011 0823   ALKPHOS 123* 04/10/2011 0443   AST 19 08/05/2011 0823   AST 15 04/10/2011 0443   ALT 14 04/10/2011 0443   BILITOT 0.70 08/05/2011 0823   BILITOT 0.3 04/10/2011 0443       RADIOGRAPHIC STUDIES: Ct Chest W Contrast  08/05/2011  *RADIOLOGY REPORT*  Clinical Data: Lung cancer, restaging.  Prior radiation and chemotherapy.  CT CHEST WITH CONTRAST  Technique:  Multidetector CT imaging of the chest was performed following the standard  protocol during bolus administration of intravenous contrast.  Contrast: 80mL OMNIPAQUE IOHEXOL 300 MG/ML  SOLN  Comparison: 04/13/2011  Findings: Stable post-treatment/radiation changes on the left. Areas of loculated left pleural fluid collections and/or pleural thickening again noted.  The left pleural effusion has decreased slightly since prior study.  Chronic airspace opacity in the left upper lobe, likely postradiation changes, stable.  Clustered nodules in the right lung base have resolved compatible with an inflammatory process.  There are scattered small right lower lobe pulmonary nodules, unchanged.  No new or enlarging pulmonary nodules bilaterally.  No pleural effusion on the right. Loculated pericardial fluid again noted, unchanged. No mediastinal, hilar, or axillary adenopathy.  Visualized thyroid and chest wall soft tissues unremarkable. Imaging into the upper abdomen shows no acute findings.  No acute bony abnormality.  IMPRESSION: Stable post-treatment changes in the left mid thorax.  Decreasing left pleural effusion.  Improving clustered nodules in the right lung base compatible with inflammatory process.  There are a few scattered stable subcentimeteric pulmonary nodules in the right lower lobe.  No new or enlarging pulmonary nodules.  Original Report Authenticated By: Cyndie Chime, M.D.    ASSESSMENT: This is a very pleasant 65 years old white female with history of limited stage small cell lung cancer status post systemic chemotherapy with carboplatin and etoposide concurrent with radiation followed by prophylactic cranial irradiation. The patient is doing fine and she has no evidence for disease recurrence on his recent scan.  PLAN: I discussed the scan results with the patient and recommended for her continuous observation for now. I would see her back for followup visit in 6 months with repeat CT scan of the chest with contrast. The patient was advised to call me immediately if she has  any concerning symptoms in the interval.  All questions were answered. The patient knows to call the clinic with any problems, questions or concerns. We can certainly see the patient much sooner if necessary.

## 2011-10-14 ENCOUNTER — Encounter: Payer: Self-pay | Admitting: Internal Medicine

## 2011-10-28 ENCOUNTER — Encounter: Payer: Self-pay | Admitting: Emergency Medicine

## 2011-10-28 ENCOUNTER — Ambulatory Visit (INDEPENDENT_AMBULATORY_CARE_PROVIDER_SITE_OTHER): Payer: Medicare Other | Admitting: Emergency Medicine

## 2011-10-28 VITALS — BP 140/80 | HR 51 | Temp 97.9°F | Ht 61.5 in | Wt 130.6 lb

## 2011-10-28 DIAGNOSIS — C3490 Malignant neoplasm of unspecified part of unspecified bronchus or lung: Secondary | ICD-10-CM

## 2011-10-28 DIAGNOSIS — J441 Chronic obstructive pulmonary disease with (acute) exacerbation: Secondary | ICD-10-CM

## 2011-10-28 NOTE — Progress Notes (Signed)
  Subjective:    Patient ID: Claudia [name], female    DOB: August 30, 1945, 66 y.o.   MRN: [id number]  HPI 66 yo former smoker with a history of COPD and small cell lung cancer diagnosed by Dr. Edwyna Shell in June 2001 and treated by Dr. Shirline Frees and Dr. Kathrynn Running. She was admitted to the hospital in early January 2013 for an exacerbation of COPD vs CAP. Her CT scan of the chest revealed no pulmonary embolism, radiation changes and scarring in the left upper lobe, stable pulmonary nodularity as detailed below, and some left sided loculated pleural fluid. She presents today as new consult. She denies any current breathing difficulty. She gets serial CT scans through Dr Shirline Frees. She was weak after discharge, now feels at baseline. She usually walks daily, does yoga. She is able to exert.   ROV 10/28/11 -- follow up for dyspnea and presumed COPD based on an AE that happened in 1/13. Also with small cell lung CA, followed by Dr Claudia [name]. Next CT scan in November. She denies any dyspnea, has a good exercise tolerance.  At this time she would like to defer PFT and BD's.      Objective:   Physical Exam Filed Vitals:   10/28/11 1515  BP: 140/80  Pulse: 51  Temp: 97.9 F (36.6 C)   Gen: Pleasant, well-nourished, in no distress,  normal affect  ENT: No lesions,  mouth clear,  oropharynx clear, no postnasal drip  Neck: No JVD, no TMG, no carotid bruits  Lungs: No use of accessory muscles, no dullness to percussion, clear without rales or rhonchi  Cardiovascular: RRR, heart sounds normal, no murmur or gallops, no peripheral edema  Musculoskeletal: No deformities, no cyanosis or clubbing  Neuro: alert, non focal  Skin: Warm, no lesions or rashes      Assessment & Plan:  Small cell lung cancer S/p chemo and XRT, doing well. Next CT scan November  COPD with acute exacerbation Severity of COPD unclear, but completely asymptomatic at this time - defer PFT or BD;s - she will contact me if her breathing  changes in any way, otherwise just follow

## 2011-10-28 NOTE — Patient Instructions (Addendum)
We will not start any medications at this time Please see Dr Delton Coombes if your breathing changes in any way Follow with Dr Arbutus Ped as planned

## 2011-10-28 NOTE — Assessment & Plan Note (Signed)
Severity of COPD unclear, but completely asymptomatic at this time - defer PFT or BD;s - she will contact me if her breathing changes in any way, otherwise just follow

## 2011-10-28 NOTE — Assessment & Plan Note (Signed)
S/p chemo and XRT, doing well. Next CT scan November

## 2012-02-01 ENCOUNTER — Telehealth: Payer: Self-pay | Admitting: Medical Oncology

## 2012-02-01 ENCOUNTER — Ambulatory Visit (HOSPITAL_COMMUNITY)
Admission: RE | Admit: 2012-02-01 | Discharge: 2012-02-01 | Disposition: A | Discharge status: Home / Self Care | Payer: Medicare Other | Source: Ambulatory Visit | Attending: Internal Medicine | Admitting: Internal Medicine

## 2012-02-01 ENCOUNTER — Other Ambulatory Visit (HOSPITAL_BASED_OUTPATIENT_CLINIC_OR_DEPARTMENT_OTHER): Payer: Medicare Other | Admitting: Lab

## 2012-02-01 DIAGNOSIS — Z923 Personal history of irradiation: Secondary | ICD-10-CM | POA: Insufficient documentation

## 2012-02-01 DIAGNOSIS — D649 Anemia, unspecified: Secondary | ICD-10-CM

## 2012-02-01 DIAGNOSIS — C3490 Malignant neoplasm of unspecified part of unspecified bronchus or lung: Secondary | ICD-10-CM

## 2012-02-01 DIAGNOSIS — Z9221 Personal history of antineoplastic chemotherapy: Secondary | ICD-10-CM | POA: Insufficient documentation

## 2012-02-01 LAB — COMPREHENSIVE METABOLIC PANEL (CC13)
ALT: 11 U/L (ref 0–55)
AST: 16 U/L (ref 5–34)
Albumin: 3.9 g/dL (ref 3.5–5.0)
Alkaline Phosphatase: 83 U/L (ref 40–150)
BUN: 12 mg/dL (ref 7.0–26.0)
CO2: 28 mEq/L (ref 22–29)
Calcium: 10.6 mg/dL — ABNORMAL HIGH (ref 8.4–10.4)
Chloride: 106 mEq/L (ref 98–107)
Creatinine: 0.9 mg/dL (ref 0.6–1.1)
Glucose: 92 mg/dl (ref 70–99)
Potassium: 4.1 mEq/L (ref 3.5–5.1)
Sodium: 140 mEq/L (ref 136–145)
Total Bilirubin: 0.53 mg/dL (ref 0.20–1.20)
Total Protein: 7.3 g/dL (ref 6.4–8.3)

## 2012-02-01 LAB — CBC WITH DIFFERENTIAL/PLATELET
BASO%: 0.5 % (ref 0.0–2.0)
Basophils Absolute: 0 10*3/uL (ref 0.0–0.1)
EOS%: 1.3 % (ref 0.0–7.0)
Eosinophils Absolute: 0.1 10*3/uL (ref 0.0–0.5)
HCT: 38.8 % (ref 34.8–46.6)
HGB: 13.2 g/dL (ref 11.6–15.9)
LYMPH%: 10 % — ABNORMAL LOW (ref 14.0–49.7)
MCH: 31.6 pg (ref 25.1–34.0)
MCHC: 33.9 g/dL (ref 31.5–36.0)
MCV: 93.3 fL (ref 79.5–101.0)
MONO#: 0.8 10*3/uL (ref 0.1–0.9)
MONO%: 13.2 % (ref 0.0–14.0)
NEUT#: 4.5 10*3/uL (ref 1.5–6.5)
NEUT%: 75 % (ref 38.4–76.8)
Platelets: 172 10*3/uL (ref 145–400)
RBC: 4.16 10*6/uL (ref 3.70–5.45)
RDW: 13.9 % (ref 11.2–14.5)
WBC: 5.9 10*3/uL (ref 3.9–10.3)
lymph#: 0.6 10*3/uL — ABNORMAL LOW (ref 0.9–3.3)

## 2012-02-01 MED ORDER — IOHEXOL 300 MG/ML  SOLN
80.0000 mL | Freq: Once | INTRAMUSCULAR | Status: AC | PRN
  Administered 2012-02-01: 80 mL via INTRAVENOUS

## 2012-02-01 NOTE — Telephone Encounter (Signed)
appt moved up to tomorrow

## 2012-02-02 ENCOUNTER — Telehealth: Payer: Self-pay | Admitting: Internal Medicine

## 2012-02-02 ENCOUNTER — Ambulatory Visit (HOSPITAL_BASED_OUTPATIENT_CLINIC_OR_DEPARTMENT_OTHER): Payer: Medicare Other | Admitting: Internal Medicine

## 2012-02-02 VITALS — BP 133/78 | HR 69 | Temp 97.1°F | Resp 20 | Ht 61.5 in | Wt 137.0 lb

## 2012-02-02 DIAGNOSIS — C3490 Malignant neoplasm of unspecified part of unspecified bronchus or lung: Secondary | ICD-10-CM

## 2012-02-02 DIAGNOSIS — C3410 Malignant neoplasm of upper lobe, unspecified bronchus or lung: Secondary | ICD-10-CM

## 2012-02-02 NOTE — Telephone Encounter (Signed)
Printed and gv pt appt schedule for May 2014

## 2012-02-02 NOTE — Telephone Encounter (Signed)
Pt came in for an appt today.  The was called 11/4 and was advised to move her appt from 11/6 to 11/5 and a pof was not sent.  Per stepahnie sch pt for 3:15.     done

## 2012-02-02 NOTE — Progress Notes (Signed)
[facility] Telephone:(336) [phone]   Fax:(336) [phone]  OFFICE PROGRESS NOTE  Elie Confer, MD [address] [place] [address]  PRINCIPAL DIAGNOSIS: Limited stage small cell lung cancer diagnosed in June 2011.   PRIOR THERAPY:  1. Status post 4 cycles of systemic chemotherapy with carboplatin and etoposide. Last dose was given 11/14/2009. This was concurrent with radiotherapy. 2. Status post prophylactic cranial irradiation completed January 28, 2010.  CURRENT THERAPY: Observation.  INTERVAL HISTORY: Claudia [name] 66 y.o. female returns to the clinic today for six-month followup visit. The patient is feeling fine today with no specific complaints. She denied having any significant weight loss or night sweats. She denied having any chest pain, shortness breath, cough or hemoptysis. She had repeat CT scan of the chest performed recently and she is here today for evaluation and discussion of her scan results.  MEDICAL HISTORY: Past Medical History  Diagnosis Date  . COPD (chronic obstructive pulmonary disease)   . Anemia   . Lung cancer 2010    Dr. Arbutus Ped, finished chemo, sp radiation, left upper     ALLERGIES:  is allergic to azithromycin and tussionex pennkinetic er.  MEDICATIONS:  Current Outpatient Prescriptions  Medication Sig Dispense Refill  . aspirin 325 MG tablet Take 81 mg by mouth daily.      . budesonide (ENTOCORT EC) 3 MG 24 hr capsule Take 3 mg by mouth daily. 3 capsules by mouth once a day      . Coenzyme Q10 (CO Q-10) 100 MG CAPS Take by mouth.      . fish oil-omega-3 fatty acids 1000 MG capsule Take 1 g by mouth daily.      . Melatonin 3 MG CAPS Take by mouth daily.      . metoprolol tartrate (LOPRESSOR) 12.5 mg TABS Take 12.5 mg by mouth 2 (two) times daily.      . Probiotic Product (PROBIOTIC DAILY PO) Take 1 tablet by mouth daily.        SURGICAL HISTORY:  Past Surgical History  Procedure Date  . Neck surgery      REVIEW OF SYSTEMS:  A comprehensive review of systems was negative.   PHYSICAL EXAMINATION: General appearance: alert, cooperative and no distress Head: Normocephalic, without obvious abnormality, atraumatic Neck: no adenopathy Lymph nodes: Cervical, supraclavicular, and axillary nodes normal. Resp: clear to auscultation bilaterally Cardio: regular rate and rhythm, S1, S2 normal, no murmur, click, rub or gallop GI: soft, non-tender; bowel sounds normal; no masses,  no organomegaly Extremities: extremities normal, atraumatic, no cyanosis or edema Neurologic: Alert and oriented X 3, normal strength and tone. Normal symmetric reflexes. Normal coordination and gait  ECOG PERFORMANCE STATUS: 0 - Asymptomatic  Blood pressure 133/78, pulse 69, temperature 97.1 F (36.2 C), temperature source Oral, resp. rate 20, height 5' 1.5" (1.562 m), weight 137 lb (62.143 kg).  LABORATORY DATA: Lab Results  Component Value Date   WBC 5.9 02/01/2012   HGB 13.2 02/01/2012   HCT 38.8 02/01/2012   MCV 93.3 02/01/2012   PLT 172 02/01/2012      Chemistry      Component Value Date/Time   NA 140 02/01/2012 0907   NA 140 08/05/2011 0823   NA 139 04/12/2011 0529   K 4.1 02/01/2012 0907   K 4.2 08/05/2011 0823   K 3.6 04/12/2011 0529   CL 106 02/01/2012 0907   CL 98 08/05/2011 0823   CL 100 04/12/2011 0529  CO2 28 02/01/2012 0907   CO2 30 08/05/2011 0823   CO2 33* 04/12/2011 0529   BUN 12.0 02/01/2012 0907   BUN 13 08/05/2011 0823   BUN 32* 04/12/2011 0529   CREATININE 0.9 02/01/2012 0907   CREATININE 1.0 08/05/2011 0823   CREATININE 1.04 04/12/2011 0529      Component Value Date/Time   CALCIUM 10.6* 02/01/2012 0907   CALCIUM 9.4 08/05/2011 0823   CALCIUM 10.0 04/12/2011 0529   ALKPHOS 83 02/01/2012 0907   ALKPHOS 69 08/05/2011 0823   ALKPHOS 123* 04/10/2011 0443   AST 16 02/01/2012 0907   AST 19 08/05/2011 0823   AST 15 04/10/2011 0443   ALT 11 02/01/2012 0907   ALT 14 04/10/2011 0443   BILITOT 0.53 02/01/2012 0907    BILITOT 0.70 08/05/2011 0823   BILITOT 0.3 04/10/2011 0443       RADIOGRAPHIC STUDIES: Ct Chest W Contrast  02/01/2012  *RADIOLOGY REPORT*  Clinical Data: Lung cancer diagnosed 2010, completed chemotherapy and radiation therapy.  CT CHEST WITH CONTRAST  Technique:  Multidetector CT imaging of the chest was performed following the standard protocol during bolus administration of intravenous contrast.  Contrast: 80mL OMNIPAQUE IOHEXOL 300 MG/ML  SOLN  Comparison: 08/05/2011  Findings: Sub centimeter left thyroid lobe nodules are again incidentally noted and stable.  Stable left hemithoracic post radiation change with volume loss and presumed left perihilar radiation port fibrosis.  Stable left anteroapical pleural thickening.  Trace pericardial effusion again noted.  No lymphadenopathy.  Incomplete imaging of the upper abdomen redemonstrates a 2.2 cm low density lateral segment left hepatic lobe mass with suggestion of nodular discontinuous peripheral enhancement versus volume averaging.  This most likely represents hemangioma or possibly cyst and is stable.  Adrenal glands are normal.  The diffuse emphysematous changes are again noted.  1 mm right upper lobe subpleural pulmonary nodule image 26 is stable.  Stable right lower lobe pulmonary nodules, larger of which measures 6 mm image 41, unchanged.  No new pulmonary mass, consolidation, or nodule is identified.  No acute osseous abnormality.  No lytic or sclerotic osseous lesion.  IMPRESSION: No change in post-treatment appearance of the chest without new CT evidence for acute intrathoracic abnormality or local recurrence/metastasis.   Original Report Authenticated By: Christiana Pellant, M.D.     ASSESSMENT: This is a very pleasant 66 years old white female diagnosed with limited stage small cell lung cancer in June of 2007 status post 4 cycles of systemic chemotherapy concurrent with radiation followed by prophylactic cranial irradiation. She has been  observation since November of 2007 with no evidence for disease recurrence.   PLAN: I discussed the scan results with the patient and her husband. I recommended for her to continue on observation for now with repeat CT scan of the chest with contrast in 6 months.  She was advised to call me immediately if she has any concerning symptoms in the interval.  All questions were answered. The patient knows to call the clinic with any problems, questions or concerns. We can certainly see the patient much sooner if necessary.

## 2012-02-02 NOTE — Patient Instructions (Signed)
No evidence for disease recurrence on the recent scan. Followup in 6 months with repeat CT scan of the chest.  

## 2012-02-03 ENCOUNTER — Ambulatory Visit: Payer: Medicare Other | Admitting: Internal Medicine

## 2012-04-14 ENCOUNTER — Encounter: Payer: Self-pay | Admitting: Emergency Medicine

## 2012-04-14 ENCOUNTER — Ambulatory Visit (INDEPENDENT_AMBULATORY_CARE_PROVIDER_SITE_OTHER): Payer: Medicare PPO | Admitting: Emergency Medicine

## 2012-04-14 VITALS — BP 128/80 | HR 78 | Temp 97.4°F | Ht 62.0 in | Wt 142.6 lb

## 2012-04-14 DIAGNOSIS — J449 Chronic obstructive pulmonary disease, unspecified: Secondary | ICD-10-CM

## 2012-04-14 DIAGNOSIS — R05 Cough: Secondary | ICD-10-CM

## 2012-04-14 DIAGNOSIS — R059 Cough, unspecified: Secondary | ICD-10-CM | POA: Insufficient documentation

## 2012-04-14 MED ORDER — LORATADINE 10 MG PO TABS: 10.0000 mg | ORAL_TABLET | Freq: Every day | ORAL | Status: DC

## 2012-04-14 MED ORDER — FLUTICASONE PROPIONATE 50 MCG/ACT NA SUSP: 2.0000 | Freq: Two times a day (BID) | NASAL | Status: DC

## 2012-04-14 NOTE — Assessment & Plan Note (Signed)
Suspect related to nasal congestion and drainage. Nothing on CT scan from 11/13 to support recurrence of her CA.  - start loratadine, fluticasone, [place] - try chlorpheniramine or brompheniramine - rov 3 mon

## 2012-04-14 NOTE — Progress Notes (Signed)
Subjective:    Patient ID: Claudia [name], female    DOB: [birth year], 67 y.o.   MRN: [id number]  HPI 67 yo former smoker with a history of COPD and small cell lung cancer diagnosed by Dr. Edwyna Shell in June 2001 and treated by Dr. Shirline Frees and Dr. Kathrynn Running. She was admitted to the hospital in early January 2013 for an exacerbation of COPD vs CAP. Her CT scan of the chest revealed no pulmonary embolism, radiation changes and scarring in the left upper lobe, stable pulmonary nodularity as detailed below, and some left sided loculated pleural fluid. She presents today as new consult. She denies any current breathing difficulty. She gets serial CT scans through Dr Shirline Frees. She was weak after discharge, now feels at baseline. She usually walks daily, does yoga. She is able to exert.   ROV 10/28/11 -- follow up for dyspnea and presumed COPD based on an AE that happened in 1/13. Also with small cell lung CA, followed by Dr Arbutus Ped. Next CT scan in November. She denies any dyspnea, has a good exercise tolerance.  At this time she would like to defer PFT and BD's.   ROV 04/14/12 -- presumed COPD, hx SCLCA. Last CTscan was 02/05/12, stable. She has had a dry cough for about a week. Non-productive. She is having clear nasal drainage, bothers her in the winter. She ? Whether she may have had a URI. She has been Nyquil - didn;t take it last night, resulted in congestion, nasal gtt, cough. Some occasional eye pressure, no real HA. Occasional GERD. No new exposures, although it was damp during her last trip to beach house. This has happened in January before.      Objective:   Physical Exam Filed Vitals:   04/14/12 1352  BP: 128/80  Pulse: 78  Temp: 97.4 F (36.3 C)   Gen: Pleasant, well-nourished, in no distress,  normal affect  ENT: No lesions,  mouth clear,  oropharynx clear, no postnasal drip  Neck: No JVD, no TMG, no carotid bruits  Lungs: No use of accessory muscles, no dullness to percussion, clear  without rales or rhonchi  Cardiovascular: RRR, heart sounds normal, no murmur or gallops, no peripheral edema  Musculoskeletal: No deformities, no cyanosis or clubbing  Neuro: alert, non focal   Skin: Warm, no lesions or rashes CT scan 02/01/12 --  Comparison: 08/05/2011  Findings: Sub centimeter left thyroid lobe nodules are again  incidentally noted and stable. Stable left hemithoracic post  radiation change with volume loss and presumed left perihilar  radiation port fibrosis. Stable left anteroapical pleural  thickening. Trace pericardial effusion again noted. No  lymphadenopathy.  Incomplete imaging of the upper abdomen redemonstrates a 2.2 cm low  density lateral segment left hepatic lobe mass with suggestion of  nodular discontinuous peripheral enhancement versus volume  averaging. This most likely represents hemangioma or possibly cyst  and is stable. Adrenal glands are normal.  The diffuse emphysematous changes are again noted. 1 mm right  upper lobe subpleural pulmonary nodule image 26 is stable. Stable  right lower lobe pulmonary nodules, larger of which measures 6 mm  image 41, unchanged. No new pulmonary mass, consolidation, or  nodule is identified.  No acute osseous abnormality. No lytic or sclerotic osseous  lesion.  IMPRESSION:  No change in post-treatment appearance of the chest without new CT  evidence for acute intrathoracic abnormality or local  recurrence/metastasis.      Assessment & Plan:  Cough Suspect related to nasal congestion  and drainage. Nothing on CT scan from 11/13 to support recurrence of her CA.  - start loratadine, fluticasone, [place] - try chlorpheniramine or brompheniramine - rov 3 mon  COPD (chronic obstructive pulmonary disease) Has not required therapy, has not gotten PFT

## 2012-04-14 NOTE — Assessment & Plan Note (Signed)
Has not required therapy, has not gotten PFT

## 2012-04-14 NOTE — Patient Instructions (Addendum)
Start using loratadine 10mg  daily Start using fluticasone nasal spray, 2 sprays each side twice a day.  Try using nasal saline washes once a day to clear out your sinuses and nasal passages.  Try using decongestants that contain either chlorpheniramine or brompheniramine Follow with Dr Delton Coombes in 3 months or sooner if you have any problems.

## 2012-05-30 ENCOUNTER — Other Ambulatory Visit: Payer: Self-pay | Admitting: Dermatology

## 2012-07-26 ENCOUNTER — Ambulatory Visit (INDEPENDENT_AMBULATORY_CARE_PROVIDER_SITE_OTHER): Payer: Medicare PPO | Admitting: Emergency Medicine

## 2012-07-26 ENCOUNTER — Encounter: Payer: Self-pay | Admitting: Emergency Medicine

## 2012-07-26 VITALS — BP 130/76 | HR 65 | Temp 98.2°F | Ht 62.0 in | Wt 143.0 lb

## 2012-07-26 DIAGNOSIS — R05 Cough: Secondary | ICD-10-CM

## 2012-07-26 DIAGNOSIS — R059 Cough, unspecified: Secondary | ICD-10-CM

## 2012-07-26 DIAGNOSIS — C3490 Malignant neoplasm of unspecified part of unspecified bronchus or lung: Secondary | ICD-10-CM

## 2012-07-26 DIAGNOSIS — J449 Chronic obstructive pulmonary disease, unspecified: Secondary | ICD-10-CM

## 2012-07-26 NOTE — Assessment & Plan Note (Signed)
Improved with treatment rhinitis. She will restart prn

## 2012-07-26 NOTE — Progress Notes (Signed)
Subjective:    Patient ID: Claudia [name], female    DOB: 1945/05/08, 67 y.o.   MRN: [id number]  HPI 67 yo former smoker with a history of COPD and small cell lung cancer diagnosed by Dr. Edwyna Shell in June 2001 and treated by Dr. Shirline Frees and Dr. Kathrynn Running. She was admitted to the hospital in early January 2013 for an exacerbation of COPD vs CAP. Her CT scan of the chest revealed no pulmonary embolism, radiation changes and scarring in the left upper lobe, stable pulmonary nodularity as detailed below, and some left sided loculated pleural fluid. She presents today as new consult. She denies any current breathing difficulty. She gets serial CT scans through Dr Shirline Frees. She was weak after discharge, now feels at baseline. She usually walks daily, does yoga. She is able to exert.   ROV 10/28/11 -- follow up for dyspnea and presumed COPD based on an AE that happened in 1/13. Also with small cell lung CA, followed by Dr Arbutus Ped. Next CT scan in November. She denies any dyspnea, has a good exercise tolerance.  At this time she would like to defer PFT and BD's.   ROV 04/14/12 -- presumed COPD, hx SCLCA. Last CTscan was 02/05/12, stable. She has had a dry cough for about a week. Non-productive. She is having clear nasal drainage, bothers her in the winter. She ? Whether she may have had a URI. She has been Nyquil - didn;t take it last night, resulted in congestion, nasal gtt, cough. Some occasional eye pressure, no real HA. Occasional GERD. No new exposures, although it was damp during her last trip to beach house. This has happened in January before.   ROV 07/26/12 -- COPD, hx SCLCA (last CT scan 11/13). At last OV she was having trouble with PND and cough - tried [place], loratadine, fluticasone nasal spray. She is using these prn for now. Remains active on no BD's. She is due for a CT scan chest next month, to be followed by Dr Arbutus Ped.      Objective:   Physical Exam Filed Vitals:   07/26/12 1449  BP: 130/76   Pulse: 65  Temp: 98.2 F (36.8 C)   Gen: Pleasant, well-nourished, in no distress,  normal affect  ENT: No lesions,  mouth clear,  oropharynx clear, no postnasal drip  Neck: No JVD, no TMG, no carotid bruits  Lungs: No use of accessory muscles, no dullness to percussion, clear without rales or rhonchi  Cardiovascular: RRR, heart sounds normal, no murmur or gallops, no peripheral edema  Musculoskeletal: No deformities, no cyanosis or clubbing  Neuro: alert, non focal   Skin: Warm, no lesions or rashes CT scan 02/01/12 --  Comparison: 08/05/2011  Findings: Sub centimeter left thyroid lobe nodules are again  incidentally noted and stable. Stable left hemithoracic post  radiation change with volume loss and presumed left perihilar  radiation port fibrosis. Stable left anteroapical pleural  thickening. Trace pericardial effusion again noted. No  lymphadenopathy.  Incomplete imaging of the upper abdomen redemonstrates a 2.2 cm low  density lateral segment left hepatic lobe mass with suggestion of  nodular discontinuous peripheral enhancement versus volume  averaging. This most likely represents hemangioma or possibly cyst  and is stable. Adrenal glands are normal.  The diffuse emphysematous changes are again noted. 1 mm right  upper lobe subpleural pulmonary nodule image 26 is stable. Stable  right lower lobe pulmonary nodules, larger of which measures 6 mm  image 41, unchanged. No new pulmonary  mass, consolidation, or  nodule is identified.  No acute osseous abnormality. No lytic or sclerotic osseous  lesion.  IMPRESSION:  No change in post-treatment appearance of the chest without new CT evidence for acute intrathoracic abnormality or local  recurrence/metastasis.      Assessment & Plan:  Small cell lung cancer Repeat Ct scan scheduled for next month, to be reviewed by Dr Arbutus Ped  COPD (chronic obstructive pulmonary disease) Mild - stable on no meds. Will revisit BD's  if she becomes symptomatic  Cough Improved with treatment rhinitis. She will restart prn

## 2012-07-26 NOTE — Assessment & Plan Note (Signed)
Mild - stable on no meds. Will revisit BD's if she becomes symptomatic

## 2012-07-26 NOTE — Assessment & Plan Note (Signed)
Repeat Ct scan scheduled for next month, to be reviewed by Dr Arbutus Ped

## 2012-07-26 NOTE — Patient Instructions (Addendum)
You can restart your nasal saline, loratadine and/or your fluticasone nasal spray if you have recurrence of your congestion and coughing. Have your CT scan next month as planned Follow with Dr Delton Coombes in 6 - 12 months or sooner if you have any problems

## 2012-07-29 ENCOUNTER — Other Ambulatory Visit: Payer: Self-pay | Admitting: *Deleted

## 2012-07-29 NOTE — Progress Notes (Signed)
Per Lilyan Punt in medical mgmt, Ct scan cannot be completed on 5/5 because her insurance has not approved the CT scan.  CT scan r/s to 5/7 and onc tx schedule filled out to r/s labs to 5/7.   Called and left a message for pt to call back to discuss scheduling changes.  SLJ

## 2012-08-01 ENCOUNTER — Other Ambulatory Visit: Payer: Medicare Other | Admitting: Lab

## 2012-08-01 ENCOUNTER — Ambulatory Visit (HOSPITAL_COMMUNITY): Payer: Medicare PPO

## 2012-08-03 ENCOUNTER — Ambulatory Visit (HOSPITAL_COMMUNITY)
Admission: RE | Admit: 2012-08-03 | Discharge: 2012-08-03 | Disposition: A | Discharge status: Home / Self Care | Payer: Medicare PPO | Source: Ambulatory Visit | Attending: Internal Medicine | Admitting: Internal Medicine

## 2012-08-03 ENCOUNTER — Ambulatory Visit (HOSPITAL_BASED_OUTPATIENT_CLINIC_OR_DEPARTMENT_OTHER): Payer: Medicare PPO | Admitting: Internal Medicine

## 2012-08-03 ENCOUNTER — Other Ambulatory Visit (HOSPITAL_BASED_OUTPATIENT_CLINIC_OR_DEPARTMENT_OTHER): Payer: Medicare PPO | Admitting: Lab

## 2012-08-03 ENCOUNTER — Encounter: Payer: Self-pay | Admitting: Internal Medicine

## 2012-08-03 ENCOUNTER — Encounter (HOSPITAL_COMMUNITY): Payer: Self-pay

## 2012-08-03 ENCOUNTER — Telehealth: Payer: Self-pay | Admitting: Internal Medicine

## 2012-08-03 VITALS — BP 152/78 | HR 88 | Temp 97.8°F | Resp 17 | Ht 62.0 in | Wt 142.1 lb

## 2012-08-03 DIAGNOSIS — J438 Other emphysema: Secondary | ICD-10-CM | POA: Insufficient documentation

## 2012-08-03 DIAGNOSIS — Y842 Radiological procedure and radiotherapy as the cause of abnormal reaction of the patient, or of later complication, without mention of misadventure at the time of the procedure: Secondary | ICD-10-CM | POA: Insufficient documentation

## 2012-08-03 DIAGNOSIS — Z923 Personal history of irradiation: Secondary | ICD-10-CM | POA: Insufficient documentation

## 2012-08-03 DIAGNOSIS — Z9221 Personal history of antineoplastic chemotherapy: Secondary | ICD-10-CM | POA: Insufficient documentation

## 2012-08-03 DIAGNOSIS — C3490 Malignant neoplasm of unspecified part of unspecified bronchus or lung: Secondary | ICD-10-CM

## 2012-08-03 DIAGNOSIS — J479 Bronchiectasis, uncomplicated: Secondary | ICD-10-CM | POA: Insufficient documentation

## 2012-08-03 DIAGNOSIS — I700 Atherosclerosis of aorta: Secondary | ICD-10-CM | POA: Insufficient documentation

## 2012-08-03 DIAGNOSIS — D1803 Hemangioma of intra-abdominal structures: Secondary | ICD-10-CM | POA: Insufficient documentation

## 2012-08-03 DIAGNOSIS — C3492 Malignant neoplasm of unspecified part of left bronchus or lung: Secondary | ICD-10-CM

## 2012-08-03 LAB — CBC WITH DIFFERENTIAL/PLATELET
BASO%: 1.2 % (ref 0.0–2.0)
Basophils Absolute: 0.1 10*3/uL (ref 0.0–0.1)
EOS%: 4.6 % (ref 0.0–7.0)
Eosinophils Absolute: 0.2 10*3/uL (ref 0.0–0.5)
HCT: 39 % (ref 34.8–46.6)
HGB: 12.7 g/dL (ref 11.6–15.9)
LYMPH%: 17.2 % (ref 14.0–49.7)
MCH: 29.5 pg (ref 25.1–34.0)
MCHC: 32.6 g/dL (ref 31.5–36.0)
MCV: 90.4 fL (ref 79.5–101.0)
MONO#: 0.9 10*3/uL (ref 0.1–0.9)
MONO%: 18.9 % — ABNORMAL HIGH (ref 0.0–14.0)
NEUT#: 2.6 10*3/uL (ref 1.5–6.5)
NEUT%: 58.1 % (ref 38.4–76.8)
Platelets: 171 10*3/uL (ref 145–400)
RBC: 4.31 10*6/uL (ref 3.70–5.45)
RDW: 14.5 % (ref 11.2–14.5)
WBC: 4.5 10*3/uL (ref 3.9–10.3)
lymph#: 0.8 10*3/uL — ABNORMAL LOW (ref 0.9–3.3)

## 2012-08-03 LAB — COMPREHENSIVE METABOLIC PANEL (CC13)
ALT: 12 U/L (ref 0–55)
AST: 17 U/L (ref 5–34)
Albumin: 3.7 g/dL (ref 3.5–5.0)
Alkaline Phosphatase: 102 U/L (ref 40–150)
BUN: 11.5 mg/dL (ref 7.0–26.0)
CO2: 29 mEq/L (ref 22–29)
Calcium: 10.4 mg/dL (ref 8.4–10.4)
Chloride: 103 mEq/L (ref 98–107)
Creatinine: 0.9 mg/dL (ref 0.6–1.1)
Glucose: 93 mg/dl (ref 70–99)
Potassium: 4.2 mEq/L (ref 3.5–5.1)
Sodium: 142 mEq/L (ref 136–145)
Total Bilirubin: 0.47 mg/dL (ref 0.20–1.20)
Total Protein: 7.3 g/dL (ref 6.4–8.3)

## 2012-08-03 MED ORDER — IOHEXOL 300 MG/ML  SOLN
80.0000 mL | Freq: Once | INTRAMUSCULAR | Status: AC | PRN
  Administered 2012-08-03: 80 mL via INTRAVENOUS

## 2012-08-03 NOTE — Progress Notes (Signed)
[facility] [phone]   [phone]  OFFICE PROGRESS NOTE  Elie Confer, MD [address] [place] [address]  PRINCIPAL DIAGNOSIS: Limited stage small cell lung cancer diagnosed in June 2011.   PRIOR THERAPY:  1. Status post 4 cycles of systemic chemotherapy with carboplatin and etoposide. Last dose was given 11/14/2009. This was concurrent with radiotherapy. 2. Status post prophylactic cranial irradiation completed January 28, 2010.  CURRENT THERAPY: Observation.  INTERVAL HISTORY: Claudia [name] 67 y.o. female returns to the clinic today for routine six-month followup visit. The patient is feeling fine today with no specific complaints. She denied having any significant chest pain, shortness breath, cough or hemoptysis. She has no weight loss or night sweats. The patient denied having any nausea or vomiting. She had repeat CT scan of the chest performed recently and she is here for evaluation and discussion of her scan results.   MEDICAL HISTORY: Past Medical History  Diagnosis Date  . COPD (chronic obstructive pulmonary disease)   . Anemia   . Lung cancer 2010    Dr. Arbutus Ped, finished chemo, sp radiation, left upper     ALLERGIES:  is allergic to azithromycin and tussionex pennkinetic er.  MEDICATIONS:  Current Outpatient Prescriptions  Medication Sig Dispense Refill  . aspirin 81 MG tablet Take 81 mg by mouth. "couple of times a week"      . Coenzyme Q10 (CO Q-10) 100 MG CAPS Take by mouth. "couple of times a week"      . fish oil-omega-3 fatty acids 1000 MG capsule Take 1 g by mouth. "couple of times a week"      . fluticasone (FLONASE) 50 MCG/ACT nasal spray Place 2 sprays into the nose as needed.      . loratadine (CLARITIN) 10 MG tablet Take 10 mg by mouth. "couple of times a week"      . Probiotic Product (PROBIOTIC DAILY PO) Take 1 tablet by mouth. "couple of times a week"       No current  facility-administered medications for this visit.    SURGICAL HISTORY:  Past Surgical History  Procedure Laterality Date  . Neck surgery      REVIEW OF SYSTEMS:  A comprehensive review of systems was negative.   PHYSICAL EXAMINATION: General appearance: alert, cooperative and no distress Head: Normocephalic, without obvious abnormality, atraumatic Neck: no adenopathy Lymph nodes: Cervical, supraclavicular, and axillary nodes normal. Resp: clear to auscultation bilaterally Cardio: regular rate and rhythm, S1, S2 normal, no murmur, click, rub or gallop GI: soft, non-tender; bowel sounds normal; no masses,  no organomegaly Extremities: extremities normal, atraumatic, no cyanosis or edema  ECOG PERFORMANCE STATUS: 0 - Asymptomatic  Blood pressure 152/78, pulse 88, temperature 97.8 F (36.6 C), temperature source Oral, resp. rate 17, height 5\' 2"  (1.575 m), weight 142 lb 1.6 oz (64.456 kg).  LABORATORY DATA: Lab Results  Component Value Date   WBC 4.5 08/03/2012   HGB 12.7 08/03/2012   HCT 39.0 08/03/2012   MCV 90.4 08/03/2012   PLT 171 08/03/2012      Chemistry      Component Value Date/Time   NA 142 08/03/2012 0842   NA 140 08/05/2011 0823   NA 139 04/12/2011 0529   K 4.2 08/03/2012 0842   K 4.2 08/05/2011 0823   K 3.6 04/12/2011 0529   CL 103 08/03/2012 0842   CL 98 08/05/2011 0823   CL 100 04/12/2011 0529  CO2 29 08/03/2012 0842   CO2 30 08/05/2011 0823   CO2 33* 04/12/2011 0529   BUN 11.5 08/03/2012 0842   BUN 13 08/05/2011 0823   BUN 32* 04/12/2011 0529   CREATININE 0.9 08/03/2012 0842   CREATININE 1.0 08/05/2011 0823   CREATININE 1.04 04/12/2011 0529      Component Value Date/Time   CALCIUM 10.4 08/03/2012 0842   CALCIUM 9.4 08/05/2011 0823   CALCIUM 10.0 04/12/2011 0529   ALKPHOS 102 08/03/2012 0842   ALKPHOS 69 08/05/2011 0823   ALKPHOS 123* 04/10/2011 0443   AST 17 08/03/2012 0842   AST 19 08/05/2011 0823   AST 15 04/10/2011 0443   ALT 12 08/03/2012 0842   ALT 14 04/10/2011 0443   BILITOT 0.47  08/03/2012 0842   BILITOT 0.70 08/05/2011 0823   BILITOT 0.3 04/10/2011 0443       RADIOGRAPHIC STUDIES: Ct Chest W Contrast  08/03/2012  *RADIOLOGY REPORT*  Clinical Data: History of lung cancer, completed chemotherapy and XRT  CT CHEST WITH CONTRAST  Technique:  Multidetector CT imaging of the chest was performed following the standard protocol during bolus administration of intravenous contrast.  Contrast: 80mL OMNIPAQUE IOHEXOL 300 MG/ML  SOLN  Comparison: Chest CT - 02/01/2012; 04/10/2011; 08/15/2009; CT chest, abdomen pelvis - 05/05/2010  Findings:  Grossly stable sequela of left-sided suprahilar radiation with associated consolidation, volume loss and traction bronchiectasis. There is grossly unchanged minimal amount of left apical pleuroparenchymal thickening.  Grossly unchanged apical predominant advanced centrolobular emphysema.  No new pulmonary nodules.  Previous identified punctate (1 to 2 mm) nodule within the right upper lobe (image 24, series five), approximately 4 mm noncalcified nodule in the right lower lobe (image 39) as well the adjacent smaller approximately 3 mm subpleural nodule also in the right lower lobe (image 38) are unchanged and stable since the 2011 examination.   No new focal airspace opacities.  No definite mediastinal, hilar or axillary lymphadenopathy.  Normal heart size.  A trace amount of pericardial fluid, presumably physiologic.  Scattered minimal atherosclerotic plaque within a normal caliber thoracic aorta.  Limited evaluation of the upper abdomen demonstrates grossly unchanged appearance of previously characterized approximately 2.5 x 2.0 cm hemangioma within the dome of the left lobe of the liver, again demonstrating a minimal amount of peripheral nodular enhancement.  No acute or aggressive osseous abnormalities.  IMPRESSION: 1.  Grossly stable radiation change of left lung with associated volume loss, architectural distortion and bronchiectasis.  No definite evidence  of locally recurrent or metastatic disease to the chest. 2.  Noncalcified nodules within the right upper and lower lobes are unchanged since the 07/2009 examination.  3.  Centrilobular emphysematous change.   Original Report Authenticated By: Tacey Ruiz, MD     ASSESSMENT: This is a very pleasant 67 years old white female with limited stage small cell lung cancer diagnosed in June of 2011 status post 4 cycles of systemic chemotherapy with carboplatin and etoposide concurrent with radiation followed by prophylactic cranial irradiation and has been observation since November 2007 was no evidence for disease recurrence.   PLAN: I discussed the scan results with the patient today. I recommended for her to continue on observation with repeat CT scan of the chest in 6 months.  She was advised to call immediately if she has any concerning symptoms in the interval  All questions were answered. The patient knows to call the clinic with any problems, questions or concerns. We can certainly see the patient  much sooner if necessary.

## 2012-08-03 NOTE — Patient Instructions (Signed)
No evidence for disease progression on the recent scan. Followup visit in 6 months with repeat CT scan of the chest

## 2012-08-03 NOTE — Telephone Encounter (Signed)
Gave pt appt for November 2014 lab before CT and MD

## 2012-08-09 ENCOUNTER — Telehealth: Payer: Self-pay | Admitting: Emergency Medicine

## 2012-08-09 NOTE — Telephone Encounter (Signed)
I think she is doing all the right things. The only thing I would add would be a decongestant, either chlorpheniramine or brompheniramine to be taken q4-6h prn for symptoms relief.

## 2012-08-09 NOTE — Telephone Encounter (Signed)
Pt was seen by RB on 07/26/12 with the following instructions:  Patient Instructions    You can restart your nasal saline, loratadine and/or your fluticasone nasal spray if you have recurrence of your congestion and coughing.  Have your CT scan next month as planned  Follow with Dr Delton Coombes in 6 - 12 months or sooner if you have any problems    --------  Called, spoke with pt.  States she's had a slight cough and congestion since OV but was outside on Sunday and symptoms have worsened some since this.  Cough is prod at times with clear mucus and has PND< some whezing last night, and is blowing clear mucus from nose.  No increased SOB, chest tightness, chest pain, or f/c/s.  She is taking OTC cough syrup qhs with relief and using the nasal saline spray, loratadine, and fluticasone.  She started neti pot yesterday.  She is scheduled to leave for the beach on Thursday.  As she was recently seen, she would like to know if RB has any further recs.  Please advise.  Thank you.  ** Note:  Pt states she had a CT Chest done last week, ordered by Dr. Arville Go, and reports it was fine.   [facility]  Allergies verified with pt: Allergies  Allergen Reactions  . Azithromycin Shortness Of Breath and Rash    Took Tussionex at same time Jan 2013.  . Tussionex Pennkinetic Er (Hydrocod Polst-Cpm Polst Er) Shortness Of Breath and Rash    Took Azithromycin at same time Jan 2013

## 2012-08-09 NOTE — Telephone Encounter (Signed)
Called, spoke with pt.  Informed her of below per RB. She verbalized understanding of this and is aware to call back if symptoms do not improve or worsen and to seek emergency care if needed.

## 2012-11-04 ENCOUNTER — Encounter: Payer: Self-pay | Admitting: *Deleted

## 2012-11-07 ENCOUNTER — Encounter: Payer: Self-pay | Admitting: Cardiovascular Disease

## 2012-11-07 ENCOUNTER — Ambulatory Visit (INDEPENDENT_AMBULATORY_CARE_PROVIDER_SITE_OTHER): Payer: Medicare PPO | Admitting: Cardiovascular Disease

## 2012-11-07 VITALS — BP 138/76 | HR 57 | Resp 16 | Ht 62.0 in | Wt 138.7 lb

## 2012-11-07 DIAGNOSIS — I498 Other specified cardiac arrhythmias: Secondary | ICD-10-CM

## 2012-11-07 DIAGNOSIS — R001 Bradycardia, unspecified: Secondary | ICD-10-CM

## 2012-11-07 NOTE — Patient Instructions (Addendum)
Your physician recommends that you schedule a follow-up appointment in: 1 year  

## 2012-12-03 ENCOUNTER — Encounter: Payer: Self-pay | Admitting: Cardiovascular Disease

## 2012-12-03 NOTE — Progress Notes (Signed)
Patient ID: Claudia [name], female   DOB: 13-Jun-1945, 67 y.o.   MRN: [id number]     Reason for office visit Probable CAD followup  Claudia [name] had an acute respiratory illness last year associated with a mild increase in cardiac enzymes. She has small cell lung cancer and therefore preferred conservative evaluation. Stressed continued to respond well to treatment for small cell lung cancer and is currently in remission. Chest no cardiac complaints. She continues to exercise 6 days a week but does complain of fatigue. She has mild resting bradycardia. She has been diagnosed with lymphocytic colitis and this is causing her more problems than anything else. She didn't trip to [facility] for the colonoscopy.    Allergies  Allergen Reactions  . Azithromycin Shortness Of Breath and Rash    Took Tussionex at same time Jan 2013.  . Tussionex Pennkinetic Er [Hydrocod Polst-Cpm Polst Er] Shortness Of Breath and Rash    Took Azithromycin at same time Jan 2013    Current Outpatient Prescriptions  Medication Sig Dispense Refill  . aspirin 81 MG tablet Take 81 mg by mouth. "couple of times a week"      . BIOTIN 5000 PO Take 5,000 mg by mouth daily.      . budesonide (ENTOCORT EC) 3 MG 24 hr capsule Take 6 mg by mouth daily as needed.      . Coenzyme Q10 (CO Q-10) 100 MG CAPS Take by mouth. "couple of times a week"      . fish oil-omega-3 fatty acids 1000 MG capsule Take 1 g by mouth. "couple of times a week"      . fluticasone (FLONASE) 50 MCG/ACT nasal spray Place 2 sprays into the nose as needed.      . loratadine (CLARITIN) 10 MG tablet Take 10 mg by mouth as needed.       . Probiotic Product (PROBIOTIC DAILY PO) Take 1 tablet by mouth. "couple of times a week"       No current facility-administered medications for this visit.    Past Medical History  Diagnosis Date  . COPD (chronic obstructive pulmonary disease)   . Anemia   . Lung cancer 2010    Dr. Arbutus Ped, finished chemo, sp radiation,  left upper   . Bradycardia     mild,may be due to beta blocker therapy  . Lymphocytic colitis     Past Surgical History  Procedure Laterality Date  . Neck surgery  1980's    Family History  Problem Relation Age of Onset  . Brain cancer Mother     brain cancer  . Diabetes Son   . Heart disease Paternal Grandfather   . Breast cancer Maternal Aunt   . Emphysema Father     History   Social History  . Marital Status: Married    Spouse Name: N/A    Number of Children: N/A  . Years of Education: N/A   Occupational History  . clerical [employer]   Social History Main Topics  . Smoking status: Former Smoker -- 1.00 packs/day for 35 years    Types: Cigarettes    Quit date: 03/31/2007  . Smokeless tobacco: Never Used  . Alcohol Use: Yes     Comment: occassional  . Drug Use: No  . Sexual Activity: No   Other Topics Concern  . Not on file   Social History Narrative  . No narrative on file    Review of systems: The patient specifically denies any chest pain  at rest or with exertion, dyspnea at rest or with exertion, orthopnea, paroxysmal nocturnal dyspnea, syncope, palpitations, focal neurological deficits, intermittent claudication, lower extremity edema, unexplained weight gain, cough, hemoptysis or wheezing.  The patient also denies nausea, vomiting, dysphagia, polyuria, polydipsia, dysuria, hematuria, frequency, urgency, abnormal bleeding or bruising, fever, chills, unexpected weight changes, mood swings, change in skin or hair texture, change in voice quality, auditory or visual problems, allergic reactions or rashes, new musculoskeletal complaints other than usual "aches and pains".   PHYSICAL EXAM BP 138/76  Pulse 57  Ht 5\' 2"  (1.575 m)  Wt 138 lb 11.2 oz (62.914 kg)  BMI 25.36 kg/m2  General: Alert, oriented x3, no distress Head: no evidence of trauma, PERRL, EOMI, no exophtalmos or lid lag, no myxedema, no xanthelasma; normal ears, nose and oropharynx Neck: normal  jugular venous pulsations and no hepatojugular reflux; brisk carotid pulses without delay and no carotid bruits Chest: clear to auscultation, no signs of consolidation by percussion or palpation, normal fremitus, symmetrical and full respiratory excursions Cardiovascular: normal position and quality of the apical impulse, regular rhythm, normal first and second heart sounds, no murmurs, rubs or gallops Abdomen: no tenderness or distention, no masses by palpation, no abnormal pulsatility or arterial bruits, normal bowel sounds, no hepatosplenomegaly Extremities: no clubbing, cyanosis or edema; 2+ radial, ulnar and brachial pulses bilaterally; 2+ right femoral, posterior tibial and dorsalis pedis pulses; 2+ left femoral, posterior tibial and dorsalis pedis pulses; no subclavian or femoral bruits Neurological: grossly nonfocal   EKG: Sinus bradycardia otherwise normal  Lipid Panel  No results found for this basename: chol, trig, hdl, cholhdl, vldl, ldlcalc    BMET    Component Value Date/Time   NA 142 08/03/2012 0842   NA 140 08/05/2011 0823   NA 139 04/12/2011 0529   K 4.2 08/03/2012 0842   K 4.2 08/05/2011 0823   K 3.6 04/12/2011 0529   CL 103 08/03/2012 0842   CL 98 08/05/2011 0823   CL 100 04/12/2011 0529   CO2 29 08/03/2012 0842   CO2 30 08/05/2011 0823   CO2 33* 04/12/2011 0529   GLUCOSE 93 08/03/2012 0842   GLUCOSE 99 08/05/2011 0823   GLUCOSE 104* 04/12/2011 0529   BUN 11.5 08/03/2012 0842   BUN 13 08/05/2011 0823   BUN 32* 04/12/2011 0529   CREATININE 0.9 08/03/2012 0842   CREATININE 1.0 08/05/2011 0823   CREATININE 1.04 04/12/2011 0529   CALCIUM 10.4 08/03/2012 0842   CALCIUM 9.4 08/05/2011 0823   CALCIUM 10.0 04/12/2011 0529   GFRNONAA 55* 04/12/2011 0529   GFRAA 64* 04/12/2011 0529     ASSESSMENT AND PLAN  There is at least moderate suspicion that Claudia [name] has underlying coronary disease but she's currently asymptomatic despite a very active lifestyle. I think continued conservative management is  indicated. I would recommend a short resume the aspirin. The beta blocker seems to be causing more complaints than benefit and I think she should gradually wean off this medication. She'll followup in a year's time, sooner if she develops chest discomfort or other symptoms of active cardiac illness  Orders Placed This Encounter  Procedures  . EKG 12-Lead   Meds ordered this encounter  Medications  . BIOTIN 5000 PO    Sig: Take 5,000 mg by mouth daily.  . budesonide (ENTOCORT EC) 3 MG 24 hr capsule    Sig: Take 6 mg by mouth daily as needed.    Leota Maka  Thurmon Fair, MD, [facility] [phone] office [phone] pager

## 2013-01-11 ENCOUNTER — Ambulatory Visit: Payer: Medicare PPO | Attending: Family Medicine

## 2013-01-11 DIAGNOSIS — R5381 Other malaise: Secondary | ICD-10-CM | POA: Insufficient documentation

## 2013-01-11 DIAGNOSIS — M25519 Pain in unspecified shoulder: Secondary | ICD-10-CM | POA: Insufficient documentation

## 2013-01-11 DIAGNOSIS — IMO0001 Reserved for inherently not codable concepts without codable children: Secondary | ICD-10-CM | POA: Insufficient documentation

## 2013-01-27 ENCOUNTER — Ambulatory Visit: Payer: Medicare PPO | Admitting: Physical Therapy

## 2013-01-31 ENCOUNTER — Encounter (INDEPENDENT_AMBULATORY_CARE_PROVIDER_SITE_OTHER): Payer: Self-pay

## 2013-01-31 ENCOUNTER — Ambulatory Visit (HOSPITAL_COMMUNITY)
Admission: RE | Admit: 2013-01-31 | Discharge: 2013-01-31 | Disposition: A | Discharge status: Home / Self Care | Payer: Medicare PPO | Source: Ambulatory Visit | Attending: Internal Medicine | Admitting: Internal Medicine

## 2013-01-31 ENCOUNTER — Other Ambulatory Visit (HOSPITAL_BASED_OUTPATIENT_CLINIC_OR_DEPARTMENT_OTHER): Payer: Medicare PPO | Admitting: Lab

## 2013-01-31 DIAGNOSIS — C3490 Malignant neoplasm of unspecified part of unspecified bronchus or lung: Secondary | ICD-10-CM | POA: Insufficient documentation

## 2013-01-31 DIAGNOSIS — R918 Other nonspecific abnormal finding of lung field: Secondary | ICD-10-CM | POA: Insufficient documentation

## 2013-01-31 DIAGNOSIS — C3492 Malignant neoplasm of unspecified part of left bronchus or lung: Secondary | ICD-10-CM

## 2013-01-31 DIAGNOSIS — C3410 Malignant neoplasm of upper lobe, unspecified bronchus or lung: Secondary | ICD-10-CM

## 2013-01-31 LAB — CBC WITH DIFFERENTIAL/PLATELET
BASO%: 0.9 % (ref 0.0–2.0)
Basophils Absolute: 0.1 10*3/uL (ref 0.0–0.1)
EOS%: 2 % (ref 0.0–7.0)
Eosinophils Absolute: 0.1 10*3/uL (ref 0.0–0.5)
HCT: 37.4 % (ref 34.8–46.6)
HGB: 12.2 g/dL (ref 11.6–15.9)
LYMPH%: 9.8 % — ABNORMAL LOW (ref 14.0–49.7)
MCH: 30 pg (ref 25.1–34.0)
MCHC: 32.7 g/dL (ref 31.5–36.0)
MCV: 92 fL (ref 79.5–101.0)
MONO#: 0.7 10*3/uL (ref 0.1–0.9)
MONO%: 13.7 % (ref 0.0–14.0)
NEUT#: 4 10*3/uL (ref 1.5–6.5)
NEUT%: 73.6 % (ref 38.4–76.8)
Platelets: 165 10*3/uL (ref 145–400)
RBC: 4.06 10*6/uL (ref 3.70–5.45)
RDW: 14.1 % (ref 11.2–14.5)
WBC: 5.5 10*3/uL (ref 3.9–10.3)
lymph#: 0.5 10*3/uL — ABNORMAL LOW (ref 0.9–3.3)

## 2013-01-31 LAB — COMPREHENSIVE METABOLIC PANEL (CC13)
ALT: 7 U/L (ref 0–55)
AST: 16 U/L (ref 5–34)
Albumin: 3.8 g/dL (ref 3.5–5.0)
Alkaline Phosphatase: 75 U/L (ref 40–150)
Anion Gap: 10 mEq/L (ref 3–11)
BUN: 12.4 mg/dL (ref 7.0–26.0)
CO2: 25 mEq/L (ref 22–29)
Calcium: 10.4 mg/dL (ref 8.4–10.4)
Chloride: 108 mEq/L (ref 98–109)
Creatinine: 0.8 mg/dL (ref 0.6–1.1)
Glucose: 85 mg/dl (ref 70–140)
Potassium: 4.2 mEq/L (ref 3.5–5.1)
Sodium: 143 mEq/L (ref 136–145)
Total Bilirubin: 0.3 mg/dL (ref 0.20–1.20)
Total Protein: 7.2 g/dL (ref 6.4–8.3)

## 2013-01-31 MED ORDER — IOHEXOL 300 MG/ML  SOLN
100.0000 mL | Freq: Once | INTRAMUSCULAR | Status: AC | PRN
  Administered 2013-01-31: 80 mL via INTRAVENOUS

## 2013-02-06 ENCOUNTER — Encounter: Payer: Self-pay | Admitting: Internal Medicine

## 2013-02-06 ENCOUNTER — Ambulatory Visit (HOSPITAL_BASED_OUTPATIENT_CLINIC_OR_DEPARTMENT_OTHER): Payer: Medicare PPO | Admitting: Internal Medicine

## 2013-02-06 VITALS — BP 137/68 | HR 76 | Temp 97.6°F | Resp 18 | Ht 62.0 in | Wt 128.8 lb

## 2013-02-06 DIAGNOSIS — C3491 Malignant neoplasm of unspecified part of right bronchus or lung: Secondary | ICD-10-CM

## 2013-02-06 DIAGNOSIS — C3410 Malignant neoplasm of upper lobe, unspecified bronchus or lung: Secondary | ICD-10-CM

## 2013-02-06 DIAGNOSIS — R911 Solitary pulmonary nodule: Secondary | ICD-10-CM

## 2013-02-06 NOTE — Patient Instructions (Signed)
Followup visit in 3 months with repeat CT scan of the chest. 

## 2013-02-06 NOTE — Progress Notes (Signed)
[facility] Telephone:([phone]) [phone]   Fax:([phone]) [phone]  OFFICE PROGRESS NOTE  Claudia Bussing, MD [address] [place] [address]  PRINCIPAL DIAGNOSIS: Limited stage small cell lung cancer diagnosed in June 2011.   PRIOR THERAPY:  1. Status post 4 cycles of systemic chemotherapy with carboplatin and etoposide. Last dose was given 11/14/2009. This was concurrent with radiotherapy. 2. Status post prophylactic cranial irradiation completed January 28, 2010.  CURRENT THERAPY: Observation.  INTERVAL HISTORY: Claudia [name] 67 y.o. female returns to the clinic today for routine six-month followup visit. The patient is feeling fine today with no specific complaints. She denied having any significant chest pain, shortness of breath, cough or hemoptysis. She has no weight loss or night sweats. The patient denied having any nausea or vomiting. She had repeat CT scan of the chest performed recently and she is here for evaluation and discussion of her scan results.   MEDICAL HISTORY: Past Medical History  Diagnosis Date  . COPD (chronic obstructive pulmonary disease)   . Anemia   . Lung cancer 2010    Dr. Arbutus Ped, finished chemo, sp radiation, left upper   . Bradycardia     mild,may be due to beta blocker therapy  . Lymphocytic colitis     ALLERGIES:  is allergic to azithromycin and tussionex pennkinetic er.  MEDICATIONS:  Current Outpatient Prescriptions  Medication Sig Dispense Refill  . aspirin 81 MG tablet Take 81 mg by mouth. "couple of times a week"      . budesonide (ENTOCORT EC) 3 MG 24 hr capsule Take 6 mg by mouth daily as needed.      . Coenzyme Q10 (CO Q-10) 100 MG CAPS Take by mouth. "couple of times a week"      . fish oil-omega-3 fatty acids 1000 MG capsule Take 1 g by mouth. "couple of times a week"      . fluticasone (FLONASE) 50 MCG/ACT nasal spray Place 2 sprays into the nose as needed.      . loratadine (CLARITIN) 10 MG  tablet Take 10 mg by mouth as needed.       . Probiotic Product (PROBIOTIC DAILY PO) Take 1 tablet by mouth. "couple of times a week"      . FLUZONE HIGH-DOSE injection        No current facility-administered medications for this visit.    SURGICAL HISTORY:  Past Surgical History  Procedure Laterality Date  . Neck surgery  1980's    REVIEW OF SYSTEMS:  A comprehensive review of systems was negative.   PHYSICAL EXAMINATION: General appearance: alert, cooperative and no distress Head: Normocephalic, without obvious abnormality, atraumatic Neck: no adenopathy Lymph nodes: Cervical, supraclavicular, and axillary nodes normal. Resp: clear to auscultation bilaterally Cardio: regular rate and rhythm, S1, S2 normal, no murmur, click, rub or gallop GI: soft, non-tender; bowel sounds normal; no masses,  no organomegaly Extremities: extremities normal, atraumatic, no cyanosis or edema  ECOG PERFORMANCE STATUS: 0 - Asymptomatic  Blood pressure 137/68, pulse 76, temperature 97.6 F (36.4 C), temperature source Oral, resp. rate 18, height 5\' 2"  (1.575 m), weight 128 lb 12.8 oz (58.423 kg), SpO2 100.00%.  LABORATORY DATA: Lab Results  Component Value Date   WBC 5.5 01/31/2013   HGB 12.2 01/31/2013   HCT 37.4 01/31/2013   MCV 92.0 01/31/2013   PLT 165 01/31/2013      Chemistry      Component Value Date/Time   NA  143 01/31/2013 0853   NA 140 08/05/2011 0823   NA 139 04/12/2011 0529   K 4.2 01/31/2013 0853   K 4.2 08/05/2011 0823   K 3.6 04/12/2011 0529   CL 103 08/03/2012 0842   CL 98 08/05/2011 0823   CL 100 04/12/2011 0529   CO2 25 01/31/2013 0853   CO2 30 08/05/2011 0823   CO2 33* 04/12/2011 0529   BUN 12.4 01/31/2013 0853   BUN 13 08/05/2011 0823   BUN 32* 04/12/2011 0529   CREATININE 0.8 01/31/2013 0853   CREATININE 1.0 08/05/2011 0823   CREATININE 1.04 04/12/2011 0529      Component Value Date/Time   CALCIUM 10.4 01/31/2013 0853   CALCIUM 9.4 08/05/2011 0823   CALCIUM 10.0 04/12/2011 0529    ALKPHOS 75 01/31/2013 0853   ALKPHOS 69 08/05/2011 0823   ALKPHOS 123* 04/10/2011 0443   AST 16 01/31/2013 0853   AST 19 08/05/2011 0823   AST 15 04/10/2011 0443   ALT 7 01/31/2013 0853   ALT 16 08/05/2011 0823   ALT 14 04/10/2011 0443   BILITOT 0.30 01/31/2013 0853   BILITOT 0.70 08/05/2011 0823   BILITOT 0.3 04/10/2011 0443       RADIOGRAPHIC STUDIES: Ct Chest W Contrast  01/31/2013   CLINICAL DATA:  Lung cancer.  EXAM: CT CHEST WITH CONTRAST  TECHNIQUE: Multidetector CT imaging of the chest was performed during intravenous contrast administration.  CONTRAST:  80mL OMNIPAQUE IOHEXOL 300 MG/ML  SOLN  COMPARISON:  CT 08/03/2012 .  FINDINGS: Mediastinum is unremarkable. No definite hilar adenopathy is noted. Thoracic aorta appears normal. Pulmonary arteries are normal. No pulmonary embolus.  Previously identified left upper lobe pleural and parenchymal soft tissue thickening again noted an is most consistent with scarring from prior radiation therapy. Stable 2.5 x 1.9 mm hemangioma left hepatic lobe, soft tissue image 49. Adrenals are normal.  The previously identified pulmonary nodules with maximum diameter up to 4 mm are again noted and unchanged. Noted however on today's exam are clusters of new right pulmonary nodules. These pulmonary nodules are subcentimeter with nodules measuring up to 4-5 mm. These nodules are present all lung windows image number 31 and lung windows image number 23. A new 6 mm nodule is present in the lingula, lung windows image 29. No definite significant pulmonary nodules in the left lung. The above-described right lung pulmonary nodules may be inflammatory or granulomatous. Metastatic disease cannot be completely excluded.  Heart size is normal. The chest wall is intact. No axillary adenopathy. Degenerative changes thoracic spine.  IMPRESSION: 1. New onset of tiny subcentimeter clusters of nodules in the right lung. These may be inflammatory and/ or granulomatous. Metastatic disease  cannot be excluded. Followup chest CT will be needed. Previously identified pulmonary nodules described on prior study of 08/03/2012 are stable. 2. Changes of radiation fibrosis left upper lung stable.   Electronically Signed   By: Maisie Fus  Register   On: 01/31/2013 10:17    ASSESSMENT AND PLAN: This is a very pleasant 67 years old white female with limited stage small cell lung cancer diagnosed in June of 2011 status post 4 cycles of systemic chemotherapy with carboplatin and etoposide concurrent with radiation followed by prophylactic cranial irradiation and has been observation since November 2007 with no evidence for disease recurrence. I discussed the scan results with the patient today. The scan showed new onset of tiny sub-centimeter clusters of nodules in the right lung questionable to be inflammatory in origin but metastatic  disease cannot be excluded. I recommended for her to continue on observation with repeat CT scan of the chest in 3 months for close monitoring of these nodules.  She was advised to call immediately if she has any concerning symptoms in the interval  All questions were answered. The patient knows to call the clinic with any problems, questions or concerns. We can certainly see the patient much sooner if necessary.

## 2013-02-07 ENCOUNTER — Telehealth: Payer: Self-pay | Admitting: Internal Medicine

## 2013-02-07 NOTE — Telephone Encounter (Signed)
s.w. pt and advised on Feb 2015 appt...pt ok and awere

## 2013-03-23 IMAGING — CR DG CHEST 2V
2 series · 2 of 2 positions shown · non-contrast
Comparison: CT chest 03/06/2011.

CLINICAL DATA: Pneumonia, cough and fever.

CHEST - 2 VIEW

[view not recorded (1 of 2)]
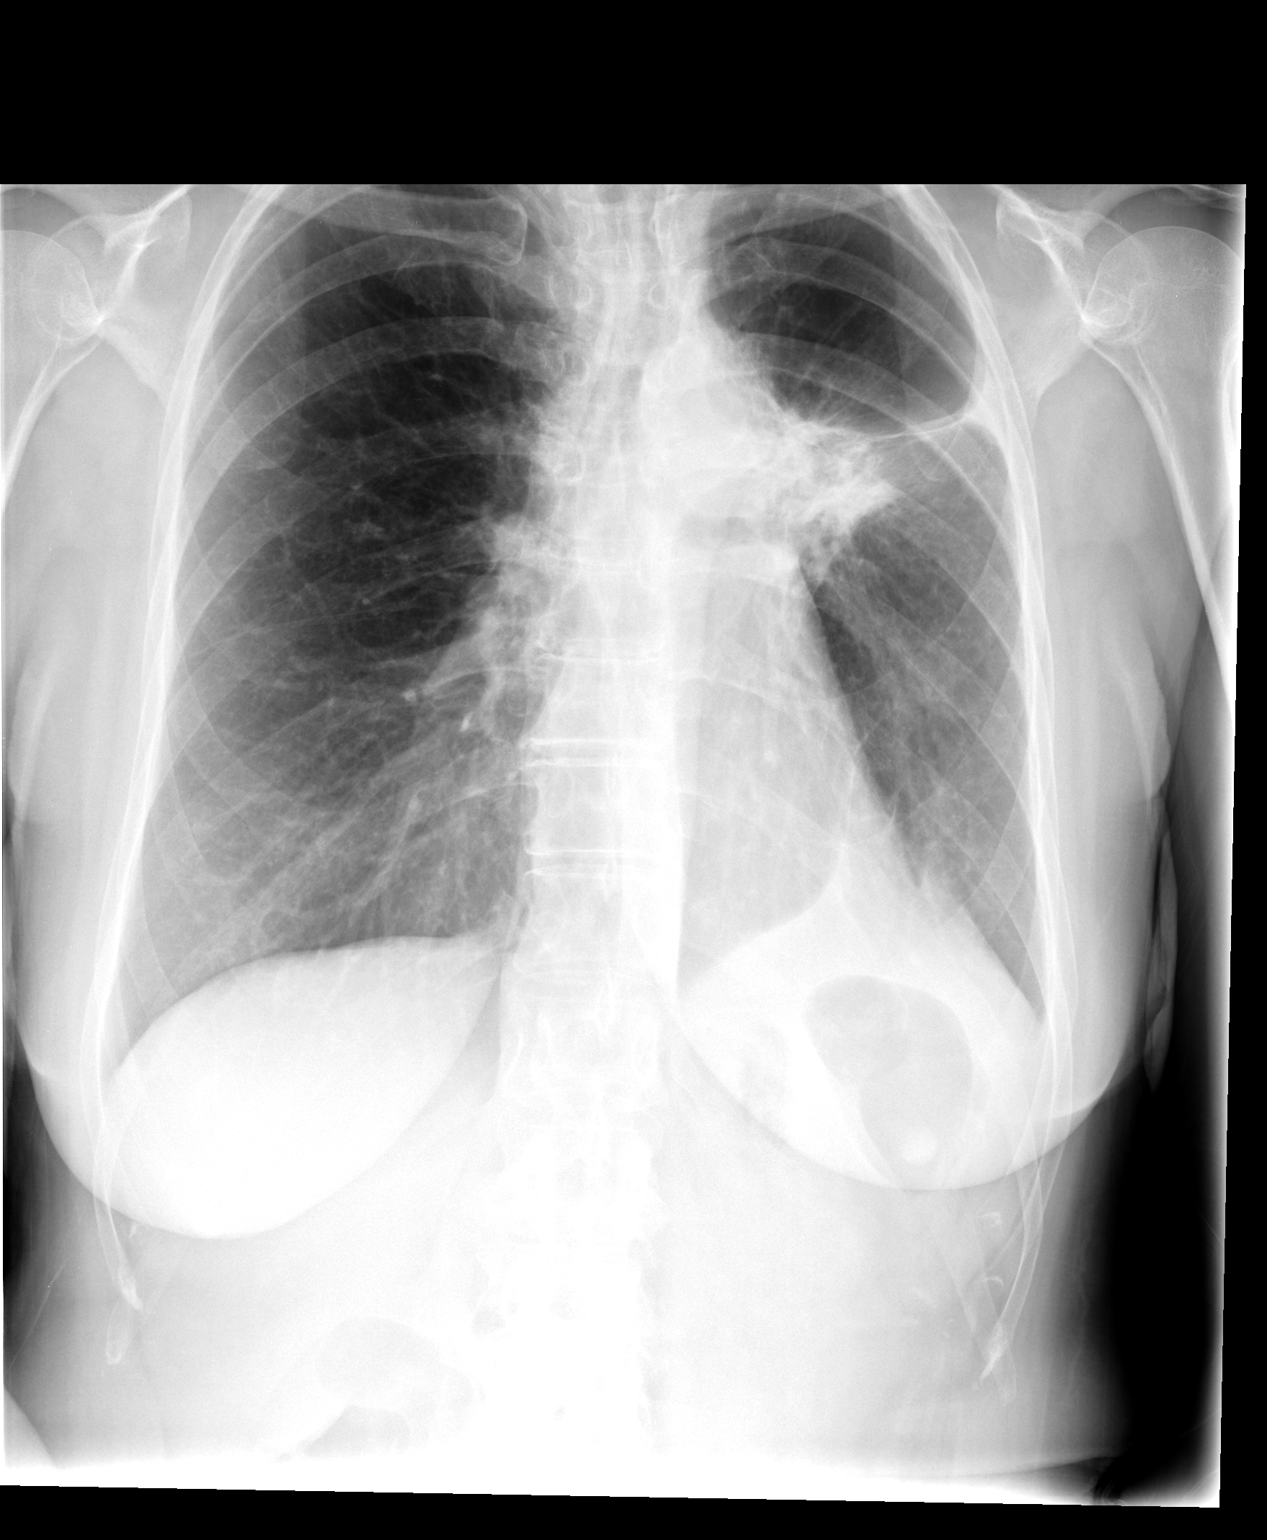

[view not recorded (2 of 2)]
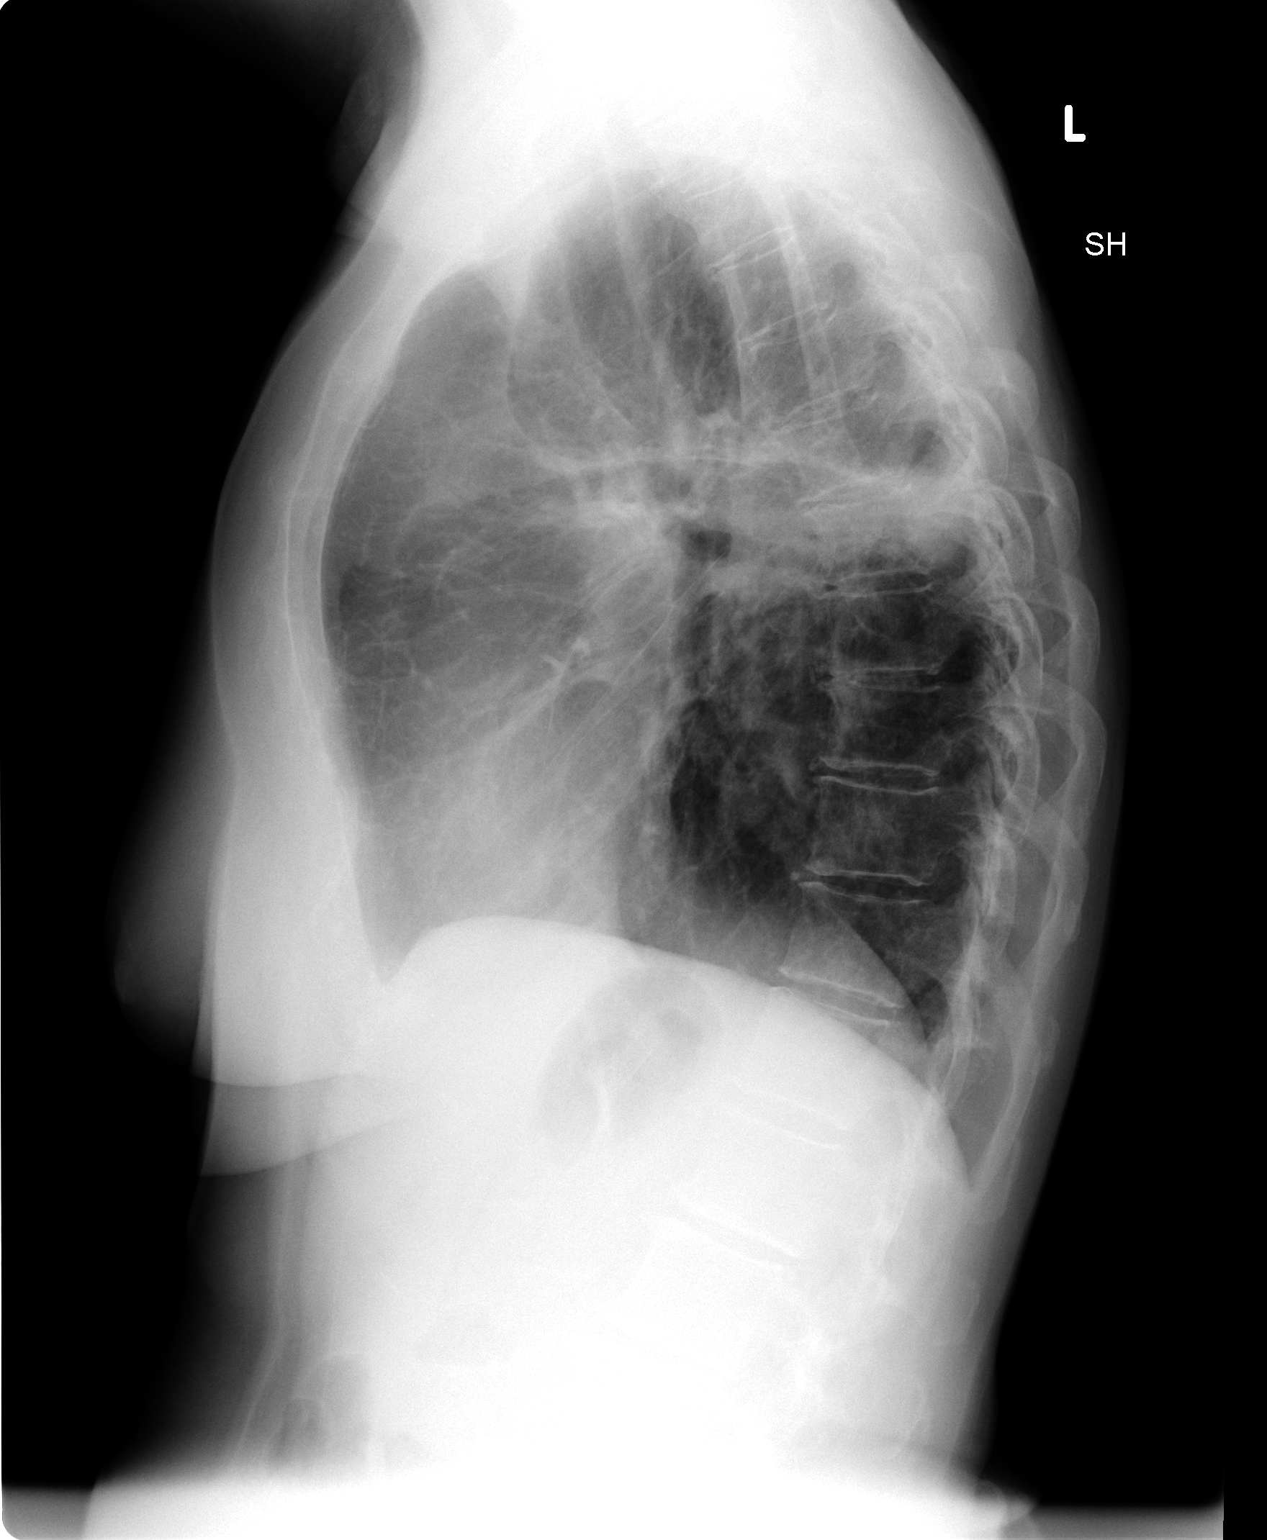

[2 of 2 positions shown; findings below may reference images not displayed]

FINDINGS: Trachea is midline.  Heart size normal.  Radiation
fibrosis and volume loss are seen in the left perihilar region.
Biapical pleural thickening, left greater than right.  No pleural
fluid.
IMPRESSION: Radiation fibrosis and volume loss in the left hemithorax.  No
acute findings.

## 2013-05-08 ENCOUNTER — Ambulatory Visit (HOSPITAL_COMMUNITY)
Admission: RE | Admit: 2013-05-08 | Discharge: 2013-05-08 | Disposition: A | Discharge status: Home / Self Care | Payer: Medicare Other | Source: Ambulatory Visit | Attending: Internal Medicine | Admitting: Internal Medicine

## 2013-05-08 ENCOUNTER — Other Ambulatory Visit (HOSPITAL_BASED_OUTPATIENT_CLINIC_OR_DEPARTMENT_OTHER): Payer: Medicare Other

## 2013-05-08 ENCOUNTER — Encounter (HOSPITAL_COMMUNITY): Payer: Self-pay

## 2013-05-08 DIAGNOSIS — C3491 Malignant neoplasm of unspecified part of right bronchus or lung: Secondary | ICD-10-CM

## 2013-05-08 DIAGNOSIS — R918 Other nonspecific abnormal finding of lung field: Secondary | ICD-10-CM | POA: Insufficient documentation

## 2013-05-08 DIAGNOSIS — C3410 Malignant neoplasm of upper lobe, unspecified bronchus or lung: Secondary | ICD-10-CM

## 2013-05-08 DIAGNOSIS — D1803 Hemangioma of intra-abdominal structures: Secondary | ICD-10-CM | POA: Insufficient documentation

## 2013-05-08 DIAGNOSIS — Z85118 Personal history of other malignant neoplasm of bronchus and lung: Secondary | ICD-10-CM | POA: Insufficient documentation

## 2013-05-08 DIAGNOSIS — J438 Other emphysema: Secondary | ICD-10-CM | POA: Insufficient documentation

## 2013-05-08 LAB — COMPREHENSIVE METABOLIC PANEL (CC13)
ALT: 11 U/L (ref 0–55)
AST: 16 U/L (ref 5–34)
Albumin: 4 g/dL (ref 3.5–5.0)
Alkaline Phosphatase: 84 U/L (ref 40–150)
Anion Gap: 8 mEq/L (ref 3–11)
BUN: 13 mg/dL (ref 7.0–26.0)
CO2: 28 mEq/L (ref 22–29)
Calcium: 10.5 mg/dL — ABNORMAL HIGH (ref 8.4–10.4)
Chloride: 106 mEq/L (ref 98–109)
Creatinine: 0.8 mg/dL (ref 0.6–1.1)
Glucose: 91 mg/dl (ref 70–140)
Potassium: 4 mEq/L (ref 3.5–5.1)
Sodium: 142 mEq/L (ref 136–145)
Total Bilirubin: 0.47 mg/dL (ref 0.20–1.20)
Total Protein: 6.9 g/dL (ref 6.4–8.3)

## 2013-05-08 LAB — CBC WITH DIFFERENTIAL/PLATELET
BASO%: 1.1 % (ref 0.0–2.0)
Basophils Absolute: 0.1 10*3/uL (ref 0.0–0.1)
EOS%: 3.4 % (ref 0.0–7.0)
Eosinophils Absolute: 0.2 10*3/uL (ref 0.0–0.5)
HCT: 37.6 % (ref 34.8–46.6)
HGB: 12.4 g/dL (ref 11.6–15.9)
LYMPH%: 14.5 % (ref 14.0–49.7)
MCH: 30 pg (ref 25.1–34.0)
MCHC: 33 g/dL (ref 31.5–36.0)
MCV: 90.8 fL (ref 79.5–101.0)
MONO#: 0.6 10*3/uL (ref 0.1–0.9)
MONO%: 11.5 % (ref 0.0–14.0)
NEUT#: 3.7 10*3/uL (ref 1.5–6.5)
NEUT%: 69.5 % (ref 38.4–76.8)
Platelets: 183 10*3/uL (ref 145–400)
RBC: 4.14 10*6/uL (ref 3.70–5.45)
RDW: 14.5 % (ref 11.2–14.5)
WBC: 5.4 10*3/uL (ref 3.9–10.3)
lymph#: 0.8 10*3/uL — ABNORMAL LOW (ref 0.9–3.3)

## 2013-05-08 MED ORDER — IOHEXOL 300 MG/ML  SOLN
80.0000 mL | Freq: Once | INTRAMUSCULAR | Status: AC | PRN
  Administered 2013-05-08: 80 mL via INTRAVENOUS

## 2013-05-10 ENCOUNTER — Ambulatory Visit (HOSPITAL_BASED_OUTPATIENT_CLINIC_OR_DEPARTMENT_OTHER): Payer: Medicare Other | Admitting: Internal Medicine

## 2013-05-10 ENCOUNTER — Encounter: Payer: Self-pay | Admitting: Internal Medicine

## 2013-05-10 ENCOUNTER — Telehealth: Payer: Self-pay | Admitting: Internal Medicine

## 2013-05-10 VITALS — BP 141/63 | HR 74 | Temp 97.7°F | Resp 18 | Ht 62.0 in | Wt 129.9 lb

## 2013-05-10 DIAGNOSIS — C3490 Malignant neoplasm of unspecified part of unspecified bronchus or lung: Secondary | ICD-10-CM

## 2013-05-10 NOTE — Progress Notes (Signed)
[place] Telephone:(336) [phone]   Fax:(336) [place], MD [address] [place] [address]  PRINCIPAL DIAGNOSIS: Limited stage small cell lung cancer diagnosed in June 2011.   PRIOR THERAPY:  1. Status post 4 cycles of systemic chemotherapy with carboplatin and etoposide. Last dose was given 11/14/2009. This was concurrent with radiotherapy. 2. Status post prophylactic cranial irradiation completed January 28, 2010.  CURRENT THERAPY: Observation.  INTERVAL HISTORY: Claudia [name] 68 y.o. female returns to the clinic today for routine 69-month followup visit. The patient is feeling fine today with no specific complaints. She denied having any significant chest pain, shortness of breath, cough or hemoptysis. She has no weight loss or night sweats. The patient denied having any nausea or vomiting. She had repeat CT scan of the chest performed recently and she is here for evaluation and discussion of her scan results.   MEDICAL HISTORY: Past Medical History  Diagnosis Date  . COPD (chronic obstructive pulmonary disease)   . Anemia   . Lung cancer 2010    Dr. Julien Nordmann, finished chemo, sp radiation, left upper   . Bradycardia     mild,may be due to beta blocker therapy  . Lymphocytic colitis     ALLERGIES:  is allergic to azithromycin and tussionex pennkinetic er.  MEDICATIONS:  Current Outpatient Prescriptions  Medication Sig Dispense Refill  . aspirin 81 MG tablet Take 81 mg by mouth. "couple of times a week"      . budesonide (ENTOCORT EC) 3 MG 24 hr capsule Take 6 mg by mouth daily as needed.      . Coenzyme Q10 (CO Q-10) 100 MG CAPS Take by mouth. "couple of times a week"      . fish oil-omega-3 fatty acids 1000 MG capsule Take 1 g by mouth. "couple of times a week"      . fluticasone (FLONASE) 50 MCG/ACT nasal spray Place 2 sprays into the nose as needed.      Marland Kitchen FLUZONE HIGH-DOSE injection         . loratadine (CLARITIN) 10 MG tablet Take 10 mg by mouth as needed.       . Probiotic Product (PROBIOTIC DAILY PO) Take 1 tablet by mouth. "couple of times a week"       No current facility-administered medications for this visit.    SURGICAL HISTORY:  Past Surgical History  Procedure Laterality Date  . Neck surgery  1980's    REVIEW OF SYSTEMS:  A comprehensive review of systems was negative.   PHYSICAL EXAMINATION: General appearance: alert, cooperative and no distress Head: Normocephalic, without obvious abnormality, atraumatic Neck: no adenopathy Lymph nodes: Cervical, supraclavicular, and axillary nodes normal. Resp: clear to auscultation bilaterally Cardio: regular rate and rhythm, S1, S2 normal, no murmur, click, rub or gallop GI: soft, non-tender; bowel sounds normal; no masses,  no organomegaly Extremities: extremities normal, atraumatic, no cyanosis or edema  ECOG PERFORMANCE STATUS: 0 - Asymptomatic  Blood pressure 141/63, pulse 74, temperature 97.7 F (36.5 C), temperature source Oral, resp. rate 18, height 5\' 2"  (1.575 m), weight 129 lb 14.4 oz (58.922 kg).  LABORATORY DATA: Lab Results  Component Value Date   WBC 5.4 05/08/2013   HGB 12.4 05/08/2013   HCT 37.6 05/08/2013   MCV 90.8 05/08/2013   PLT 183 05/08/2013      Chemistry      Component Value Date/Time   NA 142  05/08/2013 1011   NA 140 08/05/2011 0823   NA 139 04/12/2011 0529   K 4.0 05/08/2013 1011   K 4.2 08/05/2011 0823   K 3.6 04/12/2011 0529   CL 103 08/03/2012 0842   CL 98 08/05/2011 0823   CL 100 04/12/2011 0529   CO2 28 05/08/2013 1011   CO2 30 08/05/2011 0823   CO2 33* 04/12/2011 0529   BUN 13.0 05/08/2013 1011   BUN 13 08/05/2011 0823   BUN 32* 04/12/2011 0529   CREATININE 0.8 05/08/2013 1011   CREATININE 1.0 08/05/2011 0823   CREATININE 1.04 04/12/2011 0529      Component Value Date/Time   CALCIUM 10.5* 05/08/2013 1011   CALCIUM 9.4 08/05/2011 0823   CALCIUM 10.0 04/12/2011 0529   ALKPHOS 84 05/08/2013 1011    ALKPHOS 69 08/05/2011 0823   ALKPHOS 123* 04/10/2011 0443   AST 16 05/08/2013 1011   AST 19 08/05/2011 0823   AST 15 04/10/2011 0443   ALT 11 05/08/2013 1011   ALT 16 08/05/2011 0823   ALT 14 04/10/2011 0443   BILITOT 0.47 05/08/2013 1011   BILITOT 0.70 08/05/2011 0823   BILITOT 0.3 04/10/2011 0443       RADIOGRAPHIC STUDIES:  Ct Chest W Contrast  05/08/2013   CLINICAL DATA:  History of lung cancer.  EXAM: CT CHEST WITH CONTRAST  TECHNIQUE: Multidetector CT imaging of the chest was performed during intravenous contrast administration.  CONTRAST:  80 mL OMNIPAQUE IOHEXOL 300 MG/ML  SOLN  COMPARISON:  CT chest 01/31/2013 and 08/03/2012.  FINDINGS: There is volume loss the left chest as on the prior studies. No axillary, hilar or mediastinal lymphadenopathy. No pleural or pericardial effusion. Bandlike left upper lobe opacity consistent with post treatment change is stable in appearance.  As on the most recent examination. Small clusters of nodules are identified in the right upper lobe on images 22-24 of series 5. The largest of these is on image 24 measuring 0.5 cm, unchanged since the most recent study. A right upper lobe nodule on image 26 measures 0.6 cm, unchanged since the most recent study. This nodule is not seen on the 08/03/2012 exam. A tiny cluster of micronodules also seen on images 29-31 in the right upper lobe. Previously seen right lower lobe nodule measuring 0.4 cm is unchanged on image 38. The lungs are otherwise unremarkable.  Hemangioma left hepatic lobe measuring 2.3 cm in diameter is unchanged. Visualized intra-abdominal contents are otherwise unremarkable. No focal bony abnormality is by.  IMPRESSION: Clusters of micronodules in the right upper lobe do not appear changed since most recent study. The appearance is most suggestive of postinfectious or inflammatory change but continued surveillance to exclude metastatic disease is recommended.  Stable appearance of post treatment change left upper  lobe.  Emphysema.   Electronically Signed   By: Inge Rise M.D.   On: 05/08/2013 13:22   ASSESSMENT AND PLAN: This is a very pleasant 68 years old white female with limited stage small cell lung cancer diagnosed in June of 2011 status post 4 cycles of systemic chemotherapy with carboplatin and etoposide concurrent with radiation followed by prophylactic cranial irradiation and has been observation since November 2007 with no evidence for disease recurrence. I discussed the scan results with the patient today. The scan continues to show clusters of micronodules in the right upper lobe was no significant change from the last study questionable to be inflammatory in origin but metastatic disease cannot be excluded. I recommended for  her to continue on observation with repeat CT scan of the chest in 3 months for close monitoring of these nodules.  She was advised to call immediately if she has any concerning symptoms in the interval  All questions were answered. The patient knows to call the clinic with any problems, questions or concerns. We can certainly see the patient much sooner if necessary.  Disclaimer: This note was dictated with voice recognition software. Similar sounding words can inadvertently be transcribed and may not be corrected upon review.

## 2013-05-10 NOTE — Telephone Encounter (Signed)
gv adn printed aptp sched and avs for pt for May

## 2013-07-28 ENCOUNTER — Encounter (HOSPITAL_COMMUNITY): Payer: Self-pay

## 2013-07-28 ENCOUNTER — Other Ambulatory Visit (HOSPITAL_BASED_OUTPATIENT_CLINIC_OR_DEPARTMENT_OTHER): Payer: Medicare Other

## 2013-07-28 ENCOUNTER — Ambulatory Visit (HOSPITAL_COMMUNITY)
Admission: RE | Admit: 2013-07-28 | Discharge: 2013-07-28 | Disposition: A | Discharge status: Home / Self Care | Payer: Medicare Other | Source: Ambulatory Visit | Attending: Internal Medicine | Admitting: Internal Medicine

## 2013-07-28 DIAGNOSIS — C3490 Malignant neoplasm of unspecified part of unspecified bronchus or lung: Secondary | ICD-10-CM

## 2013-07-28 DIAGNOSIS — C3410 Malignant neoplasm of upper lobe, unspecified bronchus or lung: Secondary | ICD-10-CM

## 2013-07-28 LAB — COMPREHENSIVE METABOLIC PANEL (CC13)
ALT: 7 U/L (ref 0–55)
AST: 15 U/L (ref 5–34)
Albumin: 3.8 g/dL (ref 3.5–5.0)
Alkaline Phosphatase: 83 U/L (ref 40–150)
Anion Gap: 8 mEq/L (ref 3–11)
BUN: 12.8 mg/dL (ref 7.0–26.0)
CO2: 27 mEq/L (ref 22–29)
Calcium: 10.5 mg/dL — ABNORMAL HIGH (ref 8.4–10.4)
Chloride: 107 mEq/L (ref 98–109)
Creatinine: 0.9 mg/dL (ref 0.6–1.1)
Glucose: 90 mg/dl (ref 70–140)
Potassium: 4.1 mEq/L (ref 3.5–5.1)
Sodium: 142 mEq/L (ref 136–145)
Total Bilirubin: 0.59 mg/dL (ref 0.20–1.20)
Total Protein: 7 g/dL (ref 6.4–8.3)

## 2013-07-28 LAB — CBC WITH DIFFERENTIAL/PLATELET
BASO%: 1.3 % (ref 0.0–2.0)
Basophils Absolute: 0.1 10*3/uL (ref 0.0–0.1)
EOS%: 2.3 % (ref 0.0–7.0)
Eosinophils Absolute: 0.1 10*3/uL (ref 0.0–0.5)
HCT: 37.7 % (ref 34.8–46.6)
HGB: 12.4 g/dL (ref 11.6–15.9)
LYMPH%: 11.8 % — ABNORMAL LOW (ref 14.0–49.7)
MCH: 30.5 pg (ref 25.1–34.0)
MCHC: 32.8 g/dL (ref 31.5–36.0)
MCV: 92.9 fL (ref 79.5–101.0)
MONO#: 0.8 10*3/uL (ref 0.1–0.9)
MONO%: 15.7 % — ABNORMAL HIGH (ref 0.0–14.0)
NEUT#: 3.5 10*3/uL (ref 1.5–6.5)
NEUT%: 68.9 % (ref 38.4–76.8)
Platelets: 172 10*3/uL (ref 145–400)
RBC: 4.06 10*6/uL (ref 3.70–5.45)
RDW: 14.4 % (ref 11.2–14.5)
WBC: 5.1 10*3/uL (ref 3.9–10.3)
lymph#: 0.6 10*3/uL — ABNORMAL LOW (ref 0.9–3.3)

## 2013-07-28 MED ORDER — IOHEXOL 300 MG/ML  SOLN
80.0000 mL | Freq: Once | INTRAMUSCULAR | Status: AC | PRN
  Administered 2013-07-28: 80 mL via INTRAVENOUS

## 2013-08-03 ENCOUNTER — Encounter: Payer: Self-pay | Admitting: Emergency Medicine

## 2013-08-03 ENCOUNTER — Telehealth: Payer: Self-pay | Admitting: Internal Medicine

## 2013-08-03 ENCOUNTER — Ambulatory Visit (HOSPITAL_BASED_OUTPATIENT_CLINIC_OR_DEPARTMENT_OTHER): Payer: Medicare Other | Admitting: Internal Medicine

## 2013-08-03 ENCOUNTER — Encounter: Payer: Self-pay | Admitting: Internal Medicine

## 2013-08-03 ENCOUNTER — Ambulatory Visit (INDEPENDENT_AMBULATORY_CARE_PROVIDER_SITE_OTHER): Payer: Medicare Other | Admitting: Emergency Medicine

## 2013-08-03 VITALS — BP 151/64 | HR 69 | Temp 97.7°F | Resp 17 | Ht 62.0 in | Wt 134.0 lb

## 2013-08-03 VITALS — BP 120/68 | HR 99 | Ht 62.0 in | Wt 134.8 lb

## 2013-08-03 DIAGNOSIS — C3410 Malignant neoplasm of upper lobe, unspecified bronchus or lung: Secondary | ICD-10-CM

## 2013-08-03 DIAGNOSIS — C3490 Malignant neoplasm of unspecified part of unspecified bronchus or lung: Secondary | ICD-10-CM

## 2013-08-03 DIAGNOSIS — R911 Solitary pulmonary nodule: Secondary | ICD-10-CM

## 2013-08-03 DIAGNOSIS — J449 Chronic obstructive pulmonary disease, unspecified: Secondary | ICD-10-CM

## 2013-08-03 NOTE — Assessment & Plan Note (Signed)
Note her change in the RUL nodule > looks like it could be a difference in cuts. Agree that it would be prudent to check PET. This will help [place] decide whether to follow with CTs or to pursue tissue dx. She will review the results w Dr Julien Nordmann.

## 2013-08-03 NOTE — Progress Notes (Signed)
Subjective:    Patient ID: Claudia [name], female    DOB: 1946-02-26, 68 y.o.   MRN: [id number]  HPI 67 yo former smoker with a history of COPD and small cell lung cancer diagnosed by Dr. Arlyce Dice in June 2001 and treated by Dr. Earlie Server and Dr. Tammi Klippel. She was admitted to the hospital in early January 2013 for an exacerbation of COPD vs CAP. Her CT scan of the chest revealed no pulmonary embolism, radiation changes and scarring in the left upper lobe, stable pulmonary nodularity as detailed below, and some left sided loculated pleural fluid. She presents today as new consult. She denies any current breathing difficulty. She gets serial CT scans through Dr Earlie Server. She was weak after discharge, now feels at baseline. She usually walks daily, does yoga. She is able to exert.   ROV 10/28/11 -- follow up for dyspnea and presumed COPD based on an AE that happened in 1/13. Also with small cell lung CA, followed by Dr Julien Nordmann. Next CT scan in November. She denies any dyspnea, has a good exercise tolerance.  At this time she would like to defer PFT and BD's.   ROV 04/14/12 -- presumed COPD, hx SCLCA. Last CTscan was 02/05/12, stable. She has had a dry cough for about a week. Non-productive. She is having clear nasal drainage, bothers her in the winter. She ? Whether she may have had a URI. She has been Nyquil - didn;t take it last night, resulted in congestion, nasal gtt, cough. Some occasional eye pressure, no real HA. Occasional GERD. No new exposures, although it was damp during her last trip to beach house. This has happened in January before.   ROV 07/26/12 -- COPD, hx SCLCA (last CT scan 11/13). At last OV she was having trouble with PND and cough - tried [place], loratadine, fluticasone nasal spray. She is using these prn for now. Remains active on no BD's. She is due for a CT scan chest next month, to be followed by Dr Julien Nordmann.   ROV 08/03/13 -- COPD, hx SCLCA, PND and cough. Follows with Dr Julien Nordmann. Her CT  scan 5/1 shows stable nodules with a RUL nodule that looks a bit more lobulated and slightly larger to 23mm. She is going to have a PET scan to risk stratify the lesion. She feels well, no problems. She is doing well with [place]. She has some SOB when walking up hill.      Objective:   Physical Exam Filed Vitals:   08/03/13 1327  BP: 120/68  Pulse: 99  Height: 5\' 2"  (1.575 m)  Weight: 134 lb 12.8 oz (61.145 kg)  SpO2: 98%   Gen: Pleasant, well-nourished, in no distress,  normal affect  ENT: No lesions,  mouth clear,  oropharynx clear, no postnasal drip  Neck: No JVD, no TMG, no carotid bruits  Lungs: No use of accessory muscles,   Cardiovascular: RRR, heart sounds normal, no murmur or gallops, no peripheral edema  Musculoskeletal: No deformities, no cyanosis or clubbing  Neuro: alert, non focal  07/28/13 --  COMPARISON: 05/08/2013. 01/31/2013. CTs dating back to 08/05/2011.  FINDINGS:  Again noted are areas of bandlike scarring with associated  bronchiectasis in the medial left upper lobe and superior segment of  the left lower lobe. These areas are stable and compatible with post  treatment changes/scarring.  Small cluster of nodules in the right upper lobe again noted,  slightly improved in overall size and number. The largest nodule is  on image  26 measuring 3.7 mm compared to 5 mm previously. Tiny  clustered nodules more inferiorly in the right upper lobe on image  32, stable. 4 mm nodule in the right lower lobe on image 42, stable.  Small subpleural nodule on image 46 is stable. Minimal density  anteriorly in the right middle lobe and lingula compatible with  scarring.  Within the anterior inferior medial right upper lobe, there is a  somewhat spiculated nodule noted. This measures 8 mm in the same  axis measured previously when this measures 6 mm. This is measured  up on image 30 of today's study. On prior study, there appeared to  be a smaller nodule immediately  adjacent/abutting this larger  nodule. On today's study, this appears more confluent into 1 nodule  with the overall largest diameter measuring up to 11 mm. When  measuring the prior study in this same axis, this area measured 10  mm previously. When comparing to studies dating back to 08/05/2011,  this area is slowly enlarging.  No new pulmonary nodules. Mild underlying centrilobular emphysema.  No pleural effusions or pericardial effusion. Insert Heart No  mediastinal, hilar, or axillary adenopathy. Chest wall soft tissues  are unremarkable.  2.3 cm low-density lesion within the right hepatic lobe is stable,  most likely cysts. No acute findings in the upper abdomen.  No acute bony abnormality or focal bone lesion.  IMPRESSION:  Post treatment changes in the medial left upper lobe and superior  segment of the left lower lobe, stable.  Several nodular densities in the right lung, most of which are  stable. There is a slowly enlarging and spiculated appearing nodule  in the anterior inferior right upper lobe, measuring 11 x 8 mm on  today's study compared to 10 x 6 mm previously. Recommend continued  close follow-up of this pulmonary nodule with repeat CT in 3-6  months or further characterization with PET CT.       Assessment & Plan:  COPD (chronic obstructive pulmonary disease) She has no symptoms, but does have mild wheeze on exam. I discussed possibly starting BD today, but she would like to defer.  - rov in 1 year - defer BD's for now  Small cell lung cancer Note her change in the RUL nodule > looks like it could be a difference in cuts. Agree that it would be prudent to check PET. This will help [place] decide whether to follow with CTs or to pursue tissue dx. She will review the results w Dr Julien Nordmann.

## 2013-08-03 NOTE — Assessment & Plan Note (Signed)
She has no symptoms, but does have mild wheeze on exam. I discussed possibly starting BD today, but she would like to defer.  - rov in 1 year - defer BD's for now

## 2013-08-03 NOTE — Progress Notes (Signed)
[facility] [phone]   Fax:(336) [place], [place] [address] [place] [address]  PRINCIPAL DIAGNOSIS: Limited stage small cell lung cancer diagnosed in June 2011.   PRIOR THERAPY:  1. Status post 4 cycles of systemic chemotherapy with carboplatin and etoposide. Last dose was given 11/14/2009. This was concurrent with radiotherapy. 2. Status post prophylactic cranial irradiation completed January 28, 2010.  CURRENT THERAPY: Observation.  INTERVAL HISTORY: Claudia [name] 68 y.o. female returns to the clinic today for routine 93-month followup visit. The patient is feeling fine today with no specific complaints. 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 She denied having any significant chest pain, shortness of breath, cough or hemoptysis. She has no weight loss or night sweats. The patient denied having any nausea or vomiting. She had repeat CT scan of the chest performed recently and she is here for evaluation and discussion of her scan results.   MEDICAL HISTORY: Past Medical History  Diagnosis Date  . COPD (chronic obstructive pulmonary disease)   . Anemia   . Bradycardia     mild,may be due to beta blocker therapy  . Lymphocytic colitis   . Lung cancer 2010    Dr. Julien Nordmann, finished chemo, sp radiation, left upper     ALLERGIES:  is allergic to azithromycin and tussionex pennkinetic er.  MEDICATIONS:  Current Outpatient Prescriptions  Medication Sig Dispense Refill  . aspirin 81 MG tablet Take 81 mg by mouth. "couple of times a week"      . Coenzyme Q10 (CO Q-10) 100 MG CAPS Take by mouth. "couple of times a week"      . fish oil-omega-3 fatty acids 1000 MG capsule Take 1 g by mouth. "couple of times a week"      . loratadine (CLARITIN) 10 MG tablet Take 10 mg by mouth as needed.       .  Probiotic Product (PROBIOTIC DAILY PO) Take 1 tablet by mouth. "couple of times a week"      . fluticasone (FLONASE) 50 MCG/ACT nasal spray Place 2 sprays into the nose as needed.      Marland Kitchen FLUZONE HIGH-DOSE injection        No current facility-administered medications for this visit.    SURGICAL HISTORY:  Past Surgical History  Procedure Laterality Date  . Neck surgery  1980's    REVIEW OF SYSTEMS:  A comprehensive review of systems was negative.   PHYSICAL EXAMINATION: General appearance: alert, cooperative and no distress Head: Normocephalic, without obvious abnormality, atraumatic Neck: no adenopathy Lymph nodes: Cervical, supraclavicular, and axillary nodes normal. Resp: clear to auscultation bilaterally Cardio: regular rate and rhythm, S1, S2 normal, no murmur, click, rub or gallop GI: soft, non-tender; bowel sounds normal; no masses,  no organomegaly Extremities: extremities normal, atraumatic, no cyanosis or edema  ECOG PERFORMANCE STATUS: 0 - Asymptomatic  Blood pressure 151/64, pulse 69, temperature 97.7 F (36.5 C), temperature source Oral, resp. rate 17, height 5\' 2"  (1.575 m), weight 134 lb (60.782 kg), SpO2 98.00%.  LABORATORY DATA: Lab Results  Component Value Date   WBC 5.1 07/28/2013   HGB 12.4 07/28/2013   HCT 37.7 07/28/2013   MCV 92.9 07/28/2013   PLT 172 07/28/2013      Chemistry      Component Value Date/Time  NA 142 07/28/2013 0811   NA 140 08/05/2011 0823   NA 139 04/12/2011 0529   K 4.1 07/28/2013 0811   K 4.2 08/05/2011 0823   K 3.6 04/12/2011 0529   CL 103 08/03/2012 0842   CL 98 08/05/2011 0823   CL 100 04/12/2011 0529   CO2 27 07/28/2013 0811   CO2 30 08/05/2011 0823   CO2 33* 04/12/2011 0529   BUN 12.8 07/28/2013 0811   BUN 13 08/05/2011 0823   BUN 32* 04/12/2011 0529   CREATININE 0.9 07/28/2013 0811   CREATININE 1.0 08/05/2011 0823   CREATININE 1.04 04/12/2011 0529      Component Value Date/Time   CALCIUM 10.5* 07/28/2013 0811   CALCIUM 9.4 08/05/2011 0823   CALCIUM  10.0 04/12/2011 0529   ALKPHOS 83 07/28/2013 0811   ALKPHOS 69 08/05/2011 0823   ALKPHOS 123* 04/10/2011 0443   AST 15 07/28/2013 0811   AST 19 08/05/2011 0823   AST 15 04/10/2011 0443   ALT 7 07/28/2013 0811   ALT 16 08/05/2011 0823   ALT 14 04/10/2011 0443   BILITOT 0.59 07/28/2013 0811   BILITOT 0.70 08/05/2011 0823   BILITOT 0.3 04/10/2011 0443       RADIOGRAPHIC STUDIES:  Ct Chest W Contrast  07/28/2013   CLINICAL DATA:  Lung cancer, restaging.  EXAM: CT CHEST WITH CONTRAST  TECHNIQUE: Multidetector CT imaging of the chest was performed during intravenous contrast administration.  CONTRAST:  63mL OMNIPAQUE IOHEXOL 300 MG/ML  SOLN  COMPARISON:  05/08/2013. 01/31/2013. CTs dating back to 08/05/2011.  FINDINGS: Again noted are areas of bandlike scarring with associated bronchiectasis in the medial left upper lobe and superior segment of the left lower lobe. These areas are stable and compatible with post treatment changes/scarring.  Small cluster of nodules in the right upper lobe again noted, slightly improved in overall size and number. The largest nodule is on image 26 measuring 3.7 mm compared to 5 mm previously. Tiny clustered nodules more inferiorly in the right upper lobe on image 32, stable. 4 mm nodule in the right lower lobe on image 42, stable. Small subpleural nodule on image 46 is stable. Minimal density anteriorly in the right middle lobe and lingula compatible with scarring.  Within the anterior inferior medial right upper lobe, there is a somewhat spiculated nodule noted. This measures 8 mm in the same axis measured previously when this measures 6 mm. This is measured up on image 30 of today's study. On prior study, there appeared to be a smaller nodule immediately adjacent/abutting this larger nodule. On today's study, this appears more confluent into 1 nodule with the overall largest diameter measuring up to 11 mm. When measuring the prior study in this same axis, this area measured 10 mm  previously. When comparing to studies dating back to 08/05/2011, this area is slowly enlarging.  No new pulmonary nodules. Mild underlying centrilobular emphysema. No pleural effusions or pericardial effusion. Insert Heart No mediastinal, hilar, or axillary adenopathy. Chest wall soft tissues are unremarkable.  2.3 cm low-density lesion within the right hepatic lobe is stable, most likely cysts. No acute findings in the upper abdomen.  No acute bony abnormality or focal bone lesion.  IMPRESSION: Post treatment changes in the medial left upper lobe and superior segment of the left lower lobe, stable.  Several nodular densities in the right lung, most of which are stable. There is a slowly enlarging and spiculated appearing nodule in the anterior inferior right upper lobe, measuring  11 x 8 mm on today's study compared to 10 x 6 mm previously. Recommend continued close follow-up of this pulmonary nodule with repeat CT in 3-6 months or further characterization with PET CT.   Electronically Signed   By: Rolm Baptise M.D.   On: 07/28/2013 09:30   ASSESSMENT AND PLAN: This is a very pleasant 68 years old white female with limited stage small cell lung cancer diagnosed in June of 2011 status post 4 cycles of systemic chemotherapy with carboplatin and etoposide concurrent with radiation followed by prophylactic cranial irradiation and has been observation since November 2007 with no evidence for disease recurrence. I discussed the scan results with the patient today.  Repeat CT scan of the chest showed several nodular density in the right lung most of which are stable but there is a slowly enlarging and a spiculated appearing nodule in the anterior inferior right upper lobe. I discussed the scan results and showed the images to the patient today. I recommended for her to have a PET scan performed for further evaluation of this enlarging pulmonary nodule. I would see her back for followup visit in one month after  repeating the PET scan She was advised to call immediately if she has any concerning symptoms in the interval  All questions were answered. The patient knows to call the clinic with any problems, questions or concerns. We can certainly see the patient much sooner if necessary.  Disclaimer: This note was dictated with voice recognition software. Similar sounding words can inadvertently be transcribed and may not be corrected upon review.

## 2013-08-03 NOTE — Patient Instructions (Signed)
Get your PET scan as planned and then review with Dr Julien Nordmann.  Dr Julien Nordmann will contact Dr Lamonte Sakai if you need any further workup of a pulmonary nodule.  Otherwise follow with Dr  Lamonte Sakai in 1 year, sooner if you have problems.

## 2013-08-03 NOTE — Telephone Encounter (Signed)
gv and printed appt for June.Marland KitchenMarland KitchenMarland Kitchen

## 2013-08-09 ENCOUNTER — Other Ambulatory Visit: Payer: Medicare Other

## 2013-08-16 ENCOUNTER — Ambulatory Visit: Payer: Medicare Other | Admitting: Internal Medicine

## 2013-08-30 ENCOUNTER — Other Ambulatory Visit: Payer: Self-pay | Admitting: Internal Medicine

## 2013-08-30 ENCOUNTER — Ambulatory Visit (HOSPITAL_COMMUNITY)
Admission: RE | Admit: 2013-08-30 | Discharge: 2013-08-30 | Disposition: A | Discharge status: Home / Self Care | Payer: Medicare Other | Source: Ambulatory Visit | Attending: Internal Medicine | Admitting: Internal Medicine

## 2013-08-30 DIAGNOSIS — I700 Atherosclerosis of aorta: Secondary | ICD-10-CM | POA: Insufficient documentation

## 2013-08-30 DIAGNOSIS — R911 Solitary pulmonary nodule: Secondary | ICD-10-CM | POA: Insufficient documentation

## 2013-08-30 DIAGNOSIS — C3490 Malignant neoplasm of unspecified part of unspecified bronchus or lung: Secondary | ICD-10-CM

## 2013-08-30 DIAGNOSIS — T66XXXA Radiation sickness, unspecified, initial encounter: Secondary | ICD-10-CM | POA: Insufficient documentation

## 2013-08-30 DIAGNOSIS — Y842 Radiological procedure and radiotherapy as the cause of abnormal reaction of the patient, or of later complication, without mention of misadventure at the time of the procedure: Secondary | ICD-10-CM | POA: Insufficient documentation

## 2013-08-30 LAB — GLUCOSE, CAPILLARY: Glucose-Capillary: 81 mg/dL (ref 70–99)

## 2013-08-30 MED ORDER — FLUDEOXYGLUCOSE F - 18 (FDG) INJECTION
7.2000 | Freq: Once | INTRAVENOUS | Status: AC | PRN
  Administered 2013-08-30: 7.2 via INTRAVENOUS

## 2013-09-06 ENCOUNTER — Telehealth: Payer: Self-pay | Admitting: Internal Medicine

## 2013-09-06 ENCOUNTER — Ambulatory Visit (HOSPITAL_BASED_OUTPATIENT_CLINIC_OR_DEPARTMENT_OTHER): Payer: Medicare Other | Admitting: Internal Medicine

## 2013-09-06 ENCOUNTER — Encounter: Payer: Self-pay | Admitting: Internal Medicine

## 2013-09-06 VITALS — BP 158/60 | HR 64 | Temp 97.8°F | Resp 18 | Ht 62.0 in | Wt 133.4 lb

## 2013-09-06 DIAGNOSIS — C3490 Malignant neoplasm of unspecified part of unspecified bronchus or lung: Secondary | ICD-10-CM

## 2013-09-06 DIAGNOSIS — C3410 Malignant neoplasm of upper lobe, unspecified bronchus or lung: Secondary | ICD-10-CM

## 2013-09-06 NOTE — Progress Notes (Signed)
[facility] [phone]   Fax:(336) [place], [place] [address] [place] [address]  PRINCIPAL DIAGNOSIS: Limited stage small cell lung cancer diagnosed in June 2011.   PRIOR THERAPY:  1. Status post 4 cycles of systemic chemotherapy with carboplatin and etoposide. Last dose was given 11/14/2009. This was concurrent with radiotherapy. 2. Status post prophylactic cranial irradiation completed January 28, 2010.  CURRENT THERAPY: Observation.  INTERVAL HISTORY: Claudia [name] 68 y.o. female returns to the clinic today for followup visit accompanied by her husband. The patient is feeling fine today with no specific complaints. She denied having any significant chest pain, shortness of breath, cough or hemoptysis. She has no weight loss or night sweats. The patient denied having any nausea or vomiting. She was recently found on CT scan of the chest to have an enlarging right upper lobe lung nodule. I ordered a PET scan for further evaluation of this lesion and she is here today for discussion of her scan results and recommendation regarding treatment of her condition.  MEDICAL HISTORY: Past Medical History  Diagnosis Date  . COPD (chronic obstructive pulmonary disease)   . Anemia   . Bradycardia     mild,may be due to beta blocker therapy  . Lymphocytic colitis   . Lung cancer 2010    Dr. Julien Nordmann, finished chemo, sp radiation, left upper     ALLERGIES:  is allergic to azithromycin and tussionex pennkinetic er.  MEDICATIONS:  Current Outpatient Prescriptions  Medication Sig Dispense Refill  . aspirin 81 MG tablet Take 81 mg by mouth. "couple of times a week"      . Coenzyme Q10 (CO Q-10) 100 MG CAPS Take by mouth. "couple of times a week"      . fish oil-omega-3 fatty acids 1000 MG capsule Take 1 g by mouth. "couple of times a week"      . fluticasone (FLONASE) 50 MCG/ACT nasal spray Place 2  sprays into the nose as needed.      . loratadine (CLARITIN) 10 MG tablet Take 10 mg by mouth as needed.       . Probiotic Product (PROBIOTIC DAILY PO) Take 1 tablet by mouth. "couple of times a week"       No current facility-administered medications for this visit.    SURGICAL HISTORY:  Past Surgical History  Procedure Laterality Date  . Neck surgery  1980's    REVIEW OF SYSTEMS:  A comprehensive review of systems was negative.   PHYSICAL EXAMINATION: General appearance: alert, cooperative and no distress Head: Normocephalic, without obvious abnormality, atraumatic Neck: no adenopathy Lymph nodes: Cervical, supraclavicular, and axillary nodes normal. Resp: clear to auscultation bilaterally Cardio: regular rate and rhythm, S1, S2 normal, no murmur, click, rub or gallop GI: soft, non-tender; bowel sounds normal; no masses,  no organomegaly Extremities: extremities normal, atraumatic, no cyanosis or edema  ECOG PERFORMANCE STATUS: 0 - Asymptomatic  Blood pressure 158/60, pulse 64, temperature 97.8 F (36.6 C), temperature source Oral, resp. rate 18, height 5\' 2"  (1.575 m), weight 133 lb 6.4 oz (60.51 kg), SpO2 100.00%.  LABORATORY DATA: Lab Results  Component Value Date   WBC 5.1 07/28/2013   HGB 12.4 07/28/2013   HCT 37.7 07/28/2013   MCV 92.9 07/28/2013   PLT 172 07/28/2013      Chemistry      Component Value Date/Time   NA 142 07/28/2013 0811  NA 140 08/05/2011 0823   NA 139 04/12/2011 0529   K 4.1 07/28/2013 0811   K 4.2 08/05/2011 0823   K 3.6 04/12/2011 0529   CL 103 08/03/2012 0842   CL 98 08/05/2011 0823   CL 100 04/12/2011 0529   CO2 27 07/28/2013 0811   CO2 30 08/05/2011 0823   CO2 33* 04/12/2011 0529   BUN 12.8 07/28/2013 0811   BUN 13 08/05/2011 0823   BUN 32* 04/12/2011 0529   CREATININE 0.9 07/28/2013 0811   CREATININE 1.0 08/05/2011 0823   CREATININE 1.04 04/12/2011 0529      Component Value Date/Time   CALCIUM 10.5* 07/28/2013 0811   CALCIUM 9.4 08/05/2011 0823   CALCIUM 10.0  04/12/2011 0529   ALKPHOS 83 07/28/2013 0811   ALKPHOS 69 08/05/2011 0823   ALKPHOS 123* 04/10/2011 0443   AST 15 07/28/2013 0811   AST 19 08/05/2011 0823   AST 15 04/10/2011 0443   ALT 7 07/28/2013 0811   ALT 16 08/05/2011 0823   ALT 14 04/10/2011 0443   BILITOT 0.59 07/28/2013 0811   BILITOT 0.70 08/05/2011 0823   BILITOT 0.3 04/10/2011 0443       RADIOGRAPHIC STUDIES:  Nm Pet Image Restag (ps) Skull Base To Thigh  08/30/2013   CLINICAL DATA:  Subsequent treatment strategy for Lung cancer.  EXAM: NUCLEAR MEDICINE PET SKULL BASE TO THIGH  TECHNIQUE: 7.2 mCi F-18 FDG was injected intravenously. Full-ring PET imaging was performed from the skull base to thigh after the radiotracer. CT data was obtained and used for attenuation correction and anatomic localization.  FASTING BLOOD GLUCOSE:  Value: 81 mg/dl  COMPARISON:  09/04/2009  FINDINGS: NECK  No hypermetabolic lymph nodes in the neck.  CHEST  Anterior right upper lobe nodule measures 1.3 cm and has an SUV max equal to 7.5, image 34. The other small nodules in the right lung are too small to reliably characterize. Radiation changes identified within the posterior and medial left lung. Nonspecific inflammatory activity is identified within the area of radiation change. There is a superimposed nodular focus of increased uptake within the medial aspect of the radiation field which has an SUV max equal to 4.4, image 66  ABDOMEN/PELVIS  No abnormal hypermetabolic activity within the liver, pancreas, adrenal glands, or spleen. No hypermetabolic lymph nodes in the abdomen or pelvis. Atherosclerotic disease involves the abdominal aorta.  SKELETON  No focal hypermetabolic activity to suggest skeletal metastasis.  IMPRESSION: 1. There is malignant range FDG uptake associated with the right upper lobe nodule. This is worrisome for either synchronous right upper lobe primary or metastatic disease. The other smaller nodules in the right lung are too small to characterize. 2.  There is a nodular focus of malignant range FDG uptake along the medial margin of radiation change in the left lung. This is a nonspecific focus and may reflect inflammation. Residual or recurrent local tumor is not excluded.   Electronically Signed   By: Kerby Moors M.D.   On: 08/30/2013 13:02   ASSESSMENT AND PLAN: This is a very pleasant 68 years old white female with limited stage small cell lung cancer diagnosed in June of 2011 status post 4 cycles of systemic chemotherapy with carboplatin and etoposide concurrent with radiation followed by prophylactic cranial irradiation and has been observation since November 2007. Her recent CT scan of the chest in addition to the PET scan confirmed a hypermetabolic right upper lobe pulmonary nodule. This is most likely to be new  primary lung cancer but recurrence of her small cell lung cancer to be completely excluded at this point. I discussed the PET scan with the patient and her husband and they showed them the images.  I recommended for her to see Dr. Alena Bills surgery for evaluation of this new lesion and possible resection. I will see her back for followup visit in 6 weeks for evaluation and consideration of adjuvant treatment as needed. She was advised to call immediately if she has any concerning symptoms in the interval  All questions were answered. The patient knows to call the clinic with any problems, questions or concerns. We can certainly see the patient much sooner if necessary.  Disclaimer: This note was dictated with voice recognition software. Similar sounding words can inadvertently be transcribed and may not be corrected upon review.

## 2013-09-06 NOTE — Telephone Encounter (Signed)
[facility] PT APPT SCHEDULE FOR JULY

## 2013-09-08 ENCOUNTER — Telehealth: Payer: Self-pay | Admitting: *Deleted

## 2013-09-08 NOTE — Telephone Encounter (Signed)
Called left vm message regarding appt with Dr. Cyndia Bent on 09/12/13 at 11:15.  I gave the address [place].  I also gave phone number to call with questions

## 2013-09-12 ENCOUNTER — Telehealth: Payer: Self-pay | Admitting: *Deleted

## 2013-09-12 NOTE — Telephone Encounter (Signed)
Called pt to remind her of appt with Dr. Cyndia Bent for tomorrow 09/13/13.  She verbalized understanding of appt time and place.

## 2013-09-13 ENCOUNTER — Other Ambulatory Visit: Payer: Self-pay | Admitting: *Deleted

## 2013-09-13 ENCOUNTER — Encounter: Payer: Self-pay | Admitting: Surgery

## 2013-09-13 ENCOUNTER — Institutional Professional Consult (permissible substitution) (INDEPENDENT_AMBULATORY_CARE_PROVIDER_SITE_OTHER): Payer: Medicare Other | Admitting: Surgery

## 2013-09-13 VITALS — BP 146/82 | HR 66 | Resp 20 | Ht 62.0 in | Wt 133.0 lb

## 2013-09-13 DIAGNOSIS — Z85118 Personal history of other malignant neoplasm of bronchus and lung: Secondary | ICD-10-CM

## 2013-09-13 DIAGNOSIS — R911 Solitary pulmonary nodule: Secondary | ICD-10-CM

## 2013-09-13 DIAGNOSIS — D381 Neoplasm of uncertain behavior of trachea, bronchus and lung: Secondary | ICD-10-CM

## 2013-09-13 DIAGNOSIS — R918 Other nonspecific abnormal finding of lung field: Secondary | ICD-10-CM

## 2013-09-14 ENCOUNTER — Ambulatory Visit (HOSPITAL_COMMUNITY)
Admission: RE | Admit: 2013-09-14 | Discharge: 2013-09-14 | Disposition: A | Discharge status: Home / Self Care | Payer: Medicare Other | Source: Ambulatory Visit | Attending: Surgery | Admitting: Surgery

## 2013-09-14 DIAGNOSIS — Z09 Encounter for follow-up examination after completed treatment for conditions other than malignant neoplasm: Secondary | ICD-10-CM | POA: Insufficient documentation

## 2013-09-14 DIAGNOSIS — R918 Other nonspecific abnormal finding of lung field: Secondary | ICD-10-CM

## 2013-09-14 DIAGNOSIS — C3410 Malignant neoplasm of upper lobe, unspecified bronchus or lung: Secondary | ICD-10-CM | POA: Insufficient documentation

## 2013-09-14 MED ORDER — ALBUTEROL SULFATE (2.5 MG/3ML) 0.083% IN NEBU
2.5000 mg | INHALATION_SOLUTION | Freq: Once | RESPIRATORY_TRACT | Status: AC
  Administered 2013-09-14: 2.5 mg via RESPIRATORY_TRACT

## 2013-09-17 ENCOUNTER — Encounter: Payer: Self-pay | Admitting: Surgery

## 2013-09-17 NOTE — Progress Notes (Signed)
Cardiothoracic Surgery Consultation   PCP is Lujean Amel, MD Referring Provider is Curt Bears, MD  Chief Complaint  Patient presents with  . Lung Lesion    RULobe...enlarging..CT ..Marland KitchenPET    HPI:  The patient is a 68 year old woman with a history of limited stage small cell lung cancer diagnosed in June 2011 and treated with chemotherapy and concurrent radiotherapy finishing in 10/2009, followed by prophylactic cranial irradiation completed in 01/2010. She had a CT scan of the chest on 07/28/2013 that showed a 11 x 8 mm slowly enlarging spiculated nodule in the RUL. There were several other nodular densities in the right lung that were unchanged. A PET scan on 08/30/2013 showed malignant range FDG uptake in this nodule. There is a nodular focus of malignant range FDG uptake along the medial margin of the radiation change in the left lung that could be inflammation or tumor. She denies any symptoms.  Past Medical History  Diagnosis Date  . COPD (chronic obstructive pulmonary disease)   . Anemia   . Bradycardia     mild,may be due to beta blocker therapy  . Lymphocytic colitis   . Lung cancer 2010    Dr. Julien Nordmann, finished chemo, sp radiation, left upper     Past Surgical History  Procedure Laterality Date  . Neck surgery  1980's    Family History  Problem Relation Age of Onset  . Brain cancer Mother     brain cancer  . Diabetes Son   . Heart disease Paternal Grandfather   . Breast cancer Maternal Aunt   . Emphysema Father     Social History History  Substance Use Topics  . Smoking status: Former Smoker -- 1.00 packs/day for 35 years    Types: Cigarettes    Quit date: 03/31/2007  . Smokeless tobacco: Never Used  . Alcohol Use: Yes     Comment: occassional    Current Outpatient Prescriptions  Medication Sig Dispense Refill  . aspirin 81 MG tablet Take 81 mg by mouth. "couple of times a week"      . Coenzyme Q10 (CO Q-10) 100 MG CAPS Take by mouth. "couple of  times a week"      . fish oil-omega-3 fatty acids 1000 MG capsule Take 1 g by mouth. "couple of times a week"      . fluticasone (FLONASE) 50 MCG/ACT nasal spray Place 2 sprays into the nose as needed.      . loratadine (CLARITIN) 10 MG tablet Take 10 mg by mouth as needed.       . Probiotic Product (PROBIOTIC DAILY PO) Take 1 tablet by mouth. "couple of times a week"       No current facility-administered medications for this visit.    Allergies  Allergen Reactions  . Azithromycin Shortness Of Breath and Rash    Took Tussionex at same time Jan 2013.  . Tussionex Pennkinetic Er [Hydrocod Polst-Cpm Polst Er] Shortness Of Breath and Rash    Took Azithromycin at same time Jan 2013    Review of Systems  Constitutional: Negative for fever, chills, diaphoresis, activity change, appetite change, fatigue and unexpected weight change.  HENT: Negative.   Eyes: Negative.   Respiratory: Negative for cough and shortness of breath.   Cardiovascular: Negative for chest pain.  Gastrointestinal: Negative.   Endocrine: Negative.   Genitourinary: Negative.   Musculoskeletal: Negative.   Skin: Negative.   Allergic/Immunologic: Negative.   Neurological: Negative.   Hematological: Negative.   Psychiatric/Behavioral:  Negative.     BP 146/82  Pulse 66  Resp 20  Ht 5\' 2"  (1.575 m)  Wt 133 lb (60.328 kg)  BMI 24.32 kg/m2  SpO2 97% Physical Exam  Constitutional: She is oriented to person, place, and time. She appears well-developed and well-nourished. No distress.  HENT:  Head: Normocephalic and atraumatic.  Mouth/Throat: Oropharynx is clear and moist.  Eyes: EOM are normal. Pupils are equal, round, and reactive to light.  Neck: Normal range of motion. Neck supple. No JVD present. No thyromegaly present.  Cardiovascular: Normal rate, regular rhythm, normal heart sounds and intact distal pulses.   No murmur heard. Pulmonary/Chest: Effort normal and breath sounds normal. No respiratory distress.  She has no wheezes. She has no rales. She exhibits no tenderness.  Abdominal: Soft. Bowel sounds are normal. She exhibits no distension and no mass. There is no tenderness.  Musculoskeletal: Normal range of motion. She exhibits no edema.  Lymphadenopathy:    She has no cervical adenopathy.  Neurological: She is alert and oriented to person, place, and time. No cranial nerve deficit.  Skin: Skin is warm and dry.  Psychiatric: She has a normal mood and affect.     Diagnostic Tests:  CLINICAL DATA: Lung cancer, restaging.  EXAM:  CT CHEST WITH CONTRAST  TECHNIQUE:  Multidetector CT imaging of the chest was performed during  intravenous contrast administration.  CONTRAST: 11mL OMNIPAQUE IOHEXOL 300 MG/ML SOLN  COMPARISON: 05/08/2013. 01/31/2013. CTs dating back to 08/05/2011.  FINDINGS:  Again noted are areas of bandlike scarring with associated  bronchiectasis in the medial left upper lobe and superior segment of  the left lower lobe. These areas are stable and compatible with post  treatment changes/scarring.  Small cluster of nodules in the right upper lobe again noted,  slightly improved in overall size and number. The largest nodule is  on image 26 measuring 3.7 mm compared to 5 mm previously. Tiny  clustered nodules more inferiorly in the right upper lobe on image  32, stable. 4 mm nodule in the right lower lobe on image 42, stable.  Small subpleural nodule on image 46 is stable. Minimal density  anteriorly in the right middle lobe and lingula compatible with  scarring.  Within the anterior inferior medial right upper lobe, there is a  somewhat spiculated nodule noted. This measures 8 mm in the same  axis measured previously when this measures 6 mm. This is measured  up on image 30 of today's study. On prior study, there appeared to  be a smaller nodule immediately adjacent/abutting this larger  nodule. On today's study, this appears more confluent into 1 nodule  with the  overall largest diameter measuring up to 11 mm. When  measuring the prior study in this same axis, this area measured 10  mm previously. When comparing to studies dating back to 08/05/2011,  this area is slowly enlarging.  No new pulmonary nodules. Mild underlying centrilobular emphysema.  No pleural effusions or pericardial effusion. Insert Heart No  mediastinal, hilar, or axillary adenopathy. Chest wall soft tissues  are unremarkable.  2.3 cm low-density lesion within the right hepatic lobe is stable,  most likely cysts. No acute findings in the upper abdomen.  No acute bony abnormality or focal bone lesion.  IMPRESSION:  Post treatment changes in the medial left upper lobe and superior  segment of the left lower lobe, stable.  Several nodular densities in the right lung, most of which are  stable. There is a  slowly enlarging and spiculated appearing nodule  in the anterior inferior right upper lobe, measuring 11 x 8 mm on  today's study compared to 10 x 6 mm previously. Recommend continued  close follow-up of this pulmonary nodule with repeat CT in 3-6  months or further characterization with PET CT.  Electronically Signed  By: Rolm Baptise M.D.  On: 07/28/2013 09:30    CLINICAL DATA: Subsequent treatment strategy for Lung cancer.  EXAM:  NUCLEAR MEDICINE PET SKULL BASE TO THIGH  TECHNIQUE:  7.2 mCi F-18 FDG was injected intravenously. Full-ring PET imaging  was performed from the skull base to thigh after the radiotracer. CT  data was obtained and used for attenuation correction and anatomic  localization.  FASTING BLOOD GLUCOSE: Value: 81 mg/dl  COMPARISON: 09/04/2009  FINDINGS:  NECK  No hypermetabolic lymph nodes in the neck.  CHEST  Anterior right upper lobe nodule measures 1.3 cm and has an SUV max  equal to 7.5, image 34. The other small nodules in the right lung  are too small to reliably characterize. Radiation changes identified  within the posterior and medial  left lung. Nonspecific inflammatory  activity is identified within the area of radiation change. There is  a superimposed nodular focus of increased uptake within the medial  aspect of the radiation field which has an SUV max equal to 4.4,  image 66  ABDOMEN/PELVIS  No abnormal hypermetabolic activity within the liver, pancreas,  adrenal glands, or spleen. No hypermetabolic lymph nodes in the  abdomen or pelvis. Atherosclerotic disease involves the abdominal  aorta.  SKELETON  No focal hypermetabolic activity to suggest skeletal metastasis.  IMPRESSION:  1. There is malignant range FDG uptake associated with the right  upper lobe nodule. This is worrisome for either synchronous right  upper lobe primary or metastatic disease. The other smaller nodules  in the right lung are too small to characterize.  2. There is a nodular focus of malignant range FDG uptake along the  medial margin of radiation change in the left lung. This is a  nonspecific focus and may reflect inflammation. Residual or  recurrent local tumor is not excluded.  Electronically Signed  By: Kerby Moors M.D.  On: 08/30/2013 13:02   Impression:  She has an enlarging RUL nodule that is hypermetabolic on PET scan and could be a new primary lung cancer or a recurrence of her small cell cancer. She has multiple other small nodules in the right lung that appear stable and are too small to characterize by PET. There is some hypermetabolic nodular activity in the are of prior radiation change in the left lung that could be inflammatory or recurrent cancer. There is no hypermetabolic adenopathy. I think we have to assume that the RUL lesion is a solitary cancer and remove it for diagnostic and therapeutic purposes. Hopefully this can be removed with a wedge resection if I can feel it because I don't think she is a candidate for right upper lobectomy with an FEV1 of 1.14 (52% predicted) and diffusion capacity of 51%  predicted.  Plan:  She will be scheduled for resection on Thursday 09/21/2013.

## 2013-09-18 ENCOUNTER — Encounter (HOSPITAL_COMMUNITY): Payer: Self-pay

## 2013-09-19 ENCOUNTER — Encounter (HOSPITAL_COMMUNITY)
Admission: RE | Admit: 2013-09-19 | Discharge: 2013-09-19 | Disposition: A | Discharge status: Home / Self Care | Payer: Medicare Other | Source: Ambulatory Visit | Attending: Surgery | Admitting: Surgery

## 2013-09-19 ENCOUNTER — Other Ambulatory Visit: Payer: Self-pay

## 2013-09-19 ENCOUNTER — Encounter (HOSPITAL_COMMUNITY): Payer: Self-pay

## 2013-09-19 VITALS — BP 133/71 | HR 67 | Temp 98.3°F | Resp 20 | Ht 62.0 in | Wt 133.7 lb

## 2013-09-19 DIAGNOSIS — R911 Solitary pulmonary nodule: Secondary | ICD-10-CM

## 2013-09-19 HISTORY — DX: Pneumonia, unspecified organism: J18.9

## 2013-09-19 HISTORY — DX: Gastro-esophageal reflux disease without esophagitis: K21.9

## 2013-09-19 HISTORY — DX: Unspecified osteoarthritis, unspecified site: M19.90

## 2013-09-19 LAB — BLOOD GAS, ARTERIAL
Acid-Base Excess: 1.6 mmol/L (ref 0.0–2.0)
Bicarbonate: 25.4 mEq/L — ABNORMAL HIGH (ref 20.0–24.0)
Drawn by: 344381
FIO2: 0.21 %
O2 Saturation: 98.5 %
Patient temperature: 98.6
TCO2: 26.6 mmol/L (ref 0–100)
pCO2 arterial: 38.8 mmHg (ref 35.0–45.0)
pH, Arterial: 7.432 (ref 7.350–7.450)
pO2, Arterial: 86.7 mmHg (ref 80.0–100.0)

## 2013-09-19 LAB — CBC
HCT: 36.4 % (ref 36.0–46.0)
Hemoglobin: 12 g/dL (ref 12.0–15.0)
MCH: 29.9 pg (ref 26.0–34.0)
MCHC: 33 g/dL (ref 30.0–36.0)
MCV: 90.8 fL (ref 78.0–100.0)
Platelets: 172 10*3/uL (ref 150–400)
RBC: 4.01 MIL/uL (ref 3.87–5.11)
RDW: 13.6 % (ref 11.5–15.5)
WBC: 5.9 10*3/uL (ref 4.0–10.5)

## 2013-09-19 LAB — COMPREHENSIVE METABOLIC PANEL
ALT: 8 U/L (ref 0–35)
AST: 17 U/L (ref 0–37)
Albumin: 4.1 g/dL (ref 3.5–5.2)
Alkaline Phosphatase: 80 U/L (ref 39–117)
BUN: 13 mg/dL (ref 6–23)
CO2: 21 mEq/L (ref 19–32)
Calcium: 10.4 mg/dL (ref 8.4–10.5)
Chloride: 103 mEq/L (ref 96–112)
Creatinine, Ser: 0.76 mg/dL (ref 0.50–1.10)
GFR calc Af Amer: >90 mL/min (ref >90)
GFR calc non Af Amer: 85 mL/min — ABNORMAL LOW (ref >90)
Glucose, Bld: 90 mg/dL (ref 70–99)
Potassium: 4.5 mEq/L (ref 3.7–5.3)
Sodium: 139 mEq/L (ref 137–147)
Total Bilirubin: 0.3 mg/dL (ref 0.3–1.2)
Total Protein: 7.3 g/dL (ref 6.0–8.3)

## 2013-09-19 LAB — URINALYSIS, ROUTINE W REFLEX MICROSCOPIC
Bilirubin Urine: NEGATIVE
Glucose, UA: NEGATIVE mg/dL
Hgb urine dipstick: NEGATIVE
Ketones, ur: NEGATIVE mg/dL
Leukocytes, UA: NEGATIVE
Nitrite: NEGATIVE
Protein, ur: NEGATIVE mg/dL
Specific Gravity, Urine: 1.009 (ref 1.005–1.030)
Urobilinogen, UA: 0.2 mg/dL (ref 0.0–1.0)
pH: 6.5 (ref 5.0–8.0)

## 2013-09-19 LAB — SURGICAL PCR SCREEN
MRSA, PCR: NEGATIVE
Staphylococcus aureus: NEGATIVE

## 2013-09-19 LAB — APTT: aPTT: 35 seconds (ref 24–37)

## 2013-09-19 LAB — TYPE AND SCREEN
ABO/RH(D): B POS
Antibody Screen: NEGATIVE

## 2013-09-19 LAB — PROTIME-INR
INR: 1.06 (ref 0.00–1.49)
Prothrombin Time: 13.6 seconds (ref 11.6–15.2)

## 2013-09-19 LAB — ABO/RH: ABO/RH(D): B POS

## 2013-09-19 NOTE — Pre-Procedure Instructions (Signed)
Claudia [name]  09/19/2013   Your procedure is scheduled on:  Thursday, June 25.  Report to [facility] Admitting at 10:30 AM.  Call this number if you have problems the morning of surgery: [phone]   Remember:   Do not eat food or drink liquids after midnight Wednesday, June 24.   Take these medicines the morning of surgery with A SIP OF WATER: If needed: fluticasone (FLONASE), loratadine (CLARITIN).              Stop taking ASPIRIN AND FISH OIL.     Do not wear jewelry, make-up or nail polish.  Do not wear lotions, powders, or perfumes.   Do not shave 48 hours prior to surgery.   Do not bring valuables to the hospital.              [facility] is not responsible for any belongings or valuables.               Contacts, dentures or bridgework may not be worn into surgery.  Leave suitcase in the car. After surgery it may be brought to your room.  For patients admitted to the hospital, discharge time is determined by your treatment team.               Special Instructions: [place] - Preparing for Surgery  Before surgery, you can play an important role.  Because skin is not sterile, your skin needs to be as free of germs as possible.  You can reduce the number of germs on you skin by washing with CHG (chlorahexidine gluconate) soap before surgery.  CHG is an antiseptic cleaner which kills germs and bonds with the skin to continue killing germs even after washing.  Please DO NOT use if you have an allergy to CHG or antibacterial soaps.  If your skin becomes reddened/irritated stop using the CHG and inform your nurse when you arrive at Short Stay.  Do not shave (including legs and underarms) for at least 48 hours prior to the first CHG shower.  You may shave your face.  Please follow these instructions carefully:   1.  Shower with CHG Soap the night before surgery and the                                morning of Surgery.  2.  If you choose to wash your hair, wash  your hair first as usual with your       normal shampoo.  3.  After you shampoo, rinse your hair and body thoroughly to remove the                      Shampoo.  4.  Use CHG as you would any other liquid soap.  You can apply chg directly       to the skin and wash gently with scrungie or a clean washcloth.  5.  Apply the CHG Soap to your body ONLY FROM THE NECK DOWN.        Do not use on open wounds or open sores.  Avoid contact with your eyes,       ears, mouth and genitals (private parts).  Wash genitals (private parts)       with your normal soap.  6.  Wash thoroughly, paying special attention to the area where your surgery  will be performed.  7.  Thoroughly rinse your body with warm water from the neck down.  8.  DO NOT shower/wash with your normal soap after using and rinsing off       the CHG Soap.  9.  Pat yourself dry with a clean towel.            10.  Wear clean pajamas.            11.  Place clean sheets on your bed the night of your first shower and do not        sleep with pets.  Day of Surgery  Do not apply any lotions/deoderants the morning of surgery.  Please wear clean clothes to the hospital/surgery center.      Please read over the following fact sheets that you were given: Pain Booklet, Coughing and Deep Breathing, Blood Transfusion Information and Surgical Site Infection Prevention

## 2013-09-19 NOTE — Pre-Procedure Instructions (Signed)
Claudia [name]  09/19/2013   Your procedure is scheduled on:  Thursday, June 25.  Report to [facility] Admitting at 10:30 AM.  Call this number if you have problems the morning of surgery: [phone]   Remember:   Do not eat food or drink liquids after midnight Wednesday, June 24.   Take these medicines the morning of surgery with A SIP OF WATER: If needed: fluticasone (FLONASE), loratadine (CLARITIN).              Stop taking SPIRIN AND FISH OIL.     Do not wear jewelry, make-up or nail polish.  Do not wear lotions, powders, or perfumes.   Do not shave 48 hours prior to surgery.   Do not bring valuables to the hospital.              [facility] is not responsible for any belongings or valuables.               Contacts, dentures or bridgework may not be worn into surgery.  Leave suitcase in the car. After surgery it may be brought to your room.  For patients admitted to the hospital, discharge time is determined by your treatment team.               Special Instructions: -   Please read over the following fact sheets that you were given: Pain Booklet, Coughing and Deep Breathing, Blood Transfusion Information and Surgical Site Infection Prevention

## 2013-09-19 NOTE — Progress Notes (Signed)
I spoke with Claudia [name], ? Chest- x-ray, Thurmond Butts stated that Dr Cyndia Bent notes say that  Patient does not need a chest -XRAY.

## 2013-09-20 MED ORDER — DEXTROSE 5 % IV SOLN
1.5000 g | INTRAVENOUS | Status: AC
  Administered 2013-09-21: 1.5 g via INTRAVENOUS
  Filled 2013-09-20: qty 1.5

## 2013-09-20 NOTE — Progress Notes (Signed)
Anesthesia chart review: Patient is a 68 year old female scheduled for right thoracotomy, wedge resection of right upper lobe nodule, possible right upper lobectomy on 09/21/13 by Dr. Cyndia Bent.  History includes limited stage small cell left lung cancer '11 s/p chemoradiation and now with new RUL lesion, COPD, anemia, bradycardia, lymphocytic colitis, neck surgery. PCP is Dr. Dorthy Cooler. Oncologist is Dr. Julien Nordmann. Pulmonologist is Dr. Lamonte Sakai.  Cardiologist is Dr. Sallyanne Kuster, last visit 10/2012.  He saw her during a hospitalization in 03/2011 for mildly elevated troponin in the setting of acute respiratory illness with tachycardia and hypoxia. Echo ws done which showed normal LVEF and wall motion.  Stress testing was not recommended then or at her 10/2012 visit. She was exercising six times per week then without chest pain.  He did wean her off b-blocker therapy due to bradycardia and fatigue.  EKG on 09/19/13 showed NSR, LAFB. EKG appears stable when compared to her prior EKG on 11/07/12.  Echo on 04/10/11 showed: - Left ventricle: Systolic function was normal. The estimated ejection fraction was in the range of 55% to 60%. Wall motion was normal; there were no regional wall motion abnormalities. Doppler parameters are consistent with abnormal left ventricular relaxation (grade 1 diastolic dysfunction). - Ventricular septum: Septal motion showed paradox. - Atrial septum: No defect or patent foramen ovale was identified. - Pericardium, extracardiac: A trivial pericardial effusion was identified. Impressions: Findings are not consistent with elevated intracardiac pressures or decompensated CHF.  PFT on 09/14/13 notes.    Chest CT on 07/28/13 showed: Post treatment changes in the medial left upper lobe and superior segment of the left lower lobe, stable. Several nodular densities in the right lung, most of which are stable. There is a slowly enlarging and spiculated appearing nodule in the anterior inferior right upper  lobe, measuring 11 x 8 mm on today's study compared to 10 x 6 mm previously. Recommend continued close follow-up of this pulmonary nodule with repeat CT in 3-6 months or further characterization with PET CT.  According to [facility] [place], Dr. Cyndia Bent did not need patient to get a preoperative CXR.  Preoperative labs noted.   No additional cardiac testing recommended at her last cardiology visit in 10/2012.  She CP reported at her TCTS or PAT visits.  Her EKG is stable.  I reviewed with anesthesiologist Dr. Tamala Julian who agrees that if no acute changes or new CV symptoms then it is anticipated that she can proceed as planned.  [facility] Short Stay Center/Anesthesiology Phone [phone] 09/20/2013 12:32 PM

## 2013-09-21 ENCOUNTER — Inpatient Hospital Stay (HOSPITAL_COMMUNITY): Payer: Medicare Other

## 2013-09-21 ENCOUNTER — Inpatient Hospital Stay (HOSPITAL_COMMUNITY): Payer: Medicare Other | Admitting: Critical Care Medicine

## 2013-09-21 ENCOUNTER — Encounter (HOSPITAL_COMMUNITY)
Admission: RE | Disposition: A | Discharge status: Home / Self Care | Payer: Self-pay | Source: Ambulatory Visit | Attending: Surgery

## 2013-09-21 ENCOUNTER — Encounter (HOSPITAL_COMMUNITY): Payer: Medicare Other | Admitting: Vascular Surgery

## 2013-09-21 ENCOUNTER — Encounter (HOSPITAL_COMMUNITY): Payer: Self-pay | Admitting: *Deleted

## 2013-09-21 ENCOUNTER — Inpatient Hospital Stay (HOSPITAL_COMMUNITY)
Admission: RE | Admit: 2013-09-21 | Discharge: 2013-09-24 | DRG: 165 | Disposition: A | Discharge status: Home / Self Care | Payer: Medicare Other | Source: Ambulatory Visit | Attending: Surgery | Admitting: Surgery

## 2013-09-21 DIAGNOSIS — Z87891 Personal history of nicotine dependence: Secondary | ICD-10-CM

## 2013-09-21 DIAGNOSIS — Z9889 Other specified postprocedural states: Secondary | ICD-10-CM

## 2013-09-21 DIAGNOSIS — R911 Solitary pulmonary nodule: Secondary | ICD-10-CM

## 2013-09-21 DIAGNOSIS — J4489 Other specified chronic obstructive pulmonary disease: Secondary | ICD-10-CM | POA: Diagnosis present

## 2013-09-21 DIAGNOSIS — Z9221 Personal history of antineoplastic chemotherapy: Secondary | ICD-10-CM

## 2013-09-21 DIAGNOSIS — Z79899 Other long term (current) drug therapy: Secondary | ICD-10-CM

## 2013-09-21 DIAGNOSIS — C3410 Malignant neoplasm of upper lobe, unspecified bronchus or lung: Principal | ICD-10-CM | POA: Diagnosis present

## 2013-09-21 DIAGNOSIS — Z923 Personal history of irradiation: Secondary | ICD-10-CM

## 2013-09-21 DIAGNOSIS — Z7982 Long term (current) use of aspirin: Secondary | ICD-10-CM

## 2013-09-21 DIAGNOSIS — J449 Chronic obstructive pulmonary disease, unspecified: Secondary | ICD-10-CM | POA: Diagnosis present

## 2013-09-21 HISTORY — PX: THORACOTOMY: SHX5074

## 2013-09-21 HISTORY — PX: WEDGE RESECTION: SHX5070

## 2013-09-21 SURGERY — THORACOTOMY, MAJOR
Anesthesia: General | Site: Chest | Laterality: Right

## 2013-09-21 MED ORDER — ONDANSETRON HCL 4 MG/2ML IJ SOLN
INTRAMUSCULAR | Status: AC
  Filled 2013-09-21: qty 2

## 2013-09-21 MED ORDER — ARTIFICIAL TEARS OP OINT
TOPICAL_OINTMENT | OPHTHALMIC | Status: DC | PRN
  Administered 2013-09-21: 1 via OPHTHALMIC

## 2013-09-21 MED ORDER — HYDROMORPHONE HCL PF 1 MG/ML IJ SOLN
0.2500 mg | INTRAMUSCULAR | Status: DC | PRN
  Administered 2013-09-21 (×2): 0.5 mg via INTRAVENOUS

## 2013-09-21 MED ORDER — PROPOFOL 10 MG/ML IV BOLUS
INTRAVENOUS | Status: AC
  Filled 2013-09-21: qty 20

## 2013-09-21 MED ORDER — DIPHENHYDRAMINE HCL 12.5 MG/5ML PO ELIX
12.5000 mg | ORAL_SOLUTION | Freq: Four times a day (QID) | ORAL | Status: DC | PRN
  Filled 2013-09-21: qty 5

## 2013-09-21 MED ORDER — LACTATED RINGERS IV SOLN
INTRAVENOUS | Status: DC
  Administered 2013-09-21: 09:00:00 via INTRAVENOUS

## 2013-09-21 MED ORDER — POTASSIUM CHLORIDE 10 MEQ/50ML IV SOLN
10.0000 meq | Freq: Every day | INTRAVENOUS | Status: DC | PRN
  Administered 2013-09-23 (×3): 10 meq via INTRAVENOUS
  Filled 2013-09-21: qty 50

## 2013-09-21 MED ORDER — MIDAZOLAM HCL 2 MG/2ML IJ SOLN
INTRAMUSCULAR | Status: AC
  Filled 2013-09-21: qty 2

## 2013-09-21 MED ORDER — MIDAZOLAM HCL 5 MG/5ML IJ SOLN
INTRAMUSCULAR | Status: DC | PRN
  Administered 2013-09-21 (×2): 1 mg via INTRAVENOUS

## 2013-09-21 MED ORDER — ONDANSETRON HCL 4 MG/2ML IJ SOLN: 4.0000 mg | Freq: Four times a day (QID) | INTRAMUSCULAR | Status: DC | PRN

## 2013-09-21 MED ORDER — NALOXONE HCL 0.4 MG/ML IJ SOLN: 0.4000 mg | INTRAMUSCULAR | Status: DC | PRN

## 2013-09-21 MED ORDER — GLYCOPYRROLATE 0.2 MG/ML IJ SOLN
INTRAMUSCULAR | Status: AC
  Filled 2013-09-21: qty 3

## 2013-09-21 MED ORDER — HYDRALAZINE HCL 20 MG/ML IJ SOLN: 10.0000 mg | INTRAMUSCULAR | Status: DC | PRN

## 2013-09-21 MED ORDER — GLYCOPYRROLATE 0.2 MG/ML IJ SOLN
INTRAMUSCULAR | Status: DC | PRN
  Administered 2013-09-21: 0.6 mg via INTRAVENOUS

## 2013-09-21 MED ORDER — BUPIVACAINE 0.5 % ON-Q PUMP SINGLE CATH 400 ML
400.0000 mL | INJECTION | Status: DC
  Filled 2013-09-21: qty 400

## 2013-09-21 MED ORDER — PHENYLEPHRINE 40 MCG/ML (10ML) SYRINGE FOR IV PUSH (FOR BLOOD PRESSURE SUPPORT)
PREFILLED_SYRINGE | INTRAVENOUS | Status: AC
  Filled 2013-09-21: qty 10

## 2013-09-21 MED ORDER — NEOSTIGMINE METHYLSULFATE 10 MG/10ML IV SOLN
INTRAVENOUS | Status: AC
  Filled 2013-09-21: qty 1

## 2013-09-21 MED ORDER — NEOSTIGMINE METHYLSULFATE 10 MG/10ML IV SOLN
INTRAVENOUS | Status: DC | PRN
  Administered 2013-09-21: 4 mg via INTRAVENOUS

## 2013-09-21 MED ORDER — LIDOCAINE HCL (CARDIAC) 20 MG/ML IV SOLN
INTRAVENOUS | Status: DC | PRN
  Administered 2013-09-21: 100 mg via INTRATRACHEAL

## 2013-09-21 MED ORDER — SENNOSIDES-DOCUSATE SODIUM 8.6-50 MG PO TABS
1.0000 | ORAL_TABLET | Freq: Every day | ORAL | Status: DC
  Filled 2013-09-21 (×4): qty 1

## 2013-09-21 MED ORDER — HYDROMORPHONE HCL PF 1 MG/ML IJ SOLN
INTRAMUSCULAR | Status: AC
  Filled 2013-09-21: qty 1

## 2013-09-21 MED ORDER — ROCURONIUM BROMIDE 100 MG/10ML IV SOLN
INTRAVENOUS | Status: DC | PRN
  Administered 2013-09-21: 10 mg via INTRAVENOUS
  Administered 2013-09-21: 30 mg via INTRAVENOUS
  Administered 2013-09-21: 10 mg via INTRAVENOUS

## 2013-09-21 MED ORDER — DEXTROSE-NACL 5-0.9 % IV SOLN
INTRAVENOUS | Status: DC
  Administered 2013-09-21 – 2013-09-22 (×2): via INTRAVENOUS

## 2013-09-21 MED ORDER — BUPIVACAINE 0.5 % ON-Q PUMP SINGLE CATH 400 ML
INJECTION | Status: DC | PRN
  Administered 2013-09-21: 400 mL

## 2013-09-21 MED ORDER — PHENYLEPHRINE HCL 10 MG/ML IJ SOLN
INTRAMUSCULAR | Status: DC | PRN
  Administered 2013-09-21: 80 ug via INTRAVENOUS

## 2013-09-21 MED ORDER — PHENYLEPHRINE HCL 10 MG/ML IJ SOLN
10.0000 mg | INTRAVENOUS | Status: DC | PRN
  Administered 2013-09-21: 25 ug/min via INTRAVENOUS

## 2013-09-21 MED ORDER — FENTANYL 10 MCG/ML IV SOLN
INTRAVENOUS | Status: DC
  Administered 2013-09-21 (×2): 30 ug via INTRAVENOUS
  Administered 2013-09-21: 17:00:00 via INTRAVENOUS
  Administered 2013-09-22: 30 ug via INTRAVENOUS
  Administered 2013-09-22: 45 ug via INTRAVENOUS
  Administered 2013-09-22: 60 ug via INTRAVENOUS
  Administered 2013-09-22: 15 ug via INTRAVENOUS
  Administered 2013-09-22 (×2): 30 ug via INTRAVENOUS
  Administered 2013-09-23: 15 ug via INTRAVENOUS
  Administered 2013-09-23: 75 ug via INTRAVENOUS
  Administered 2013-09-23: 15 ug via INTRAVENOUS
  Filled 2013-09-21 (×3): qty 50

## 2013-09-21 MED ORDER — FENTANYL CITRATE 0.05 MG/ML IJ SOLN
INTRAMUSCULAR | Status: AC
  Filled 2013-09-21: qty 5

## 2013-09-21 MED ORDER — ACETAMINOPHEN 500 MG PO TABS
1000.0000 mg | ORAL_TABLET | Freq: Four times a day (QID) | ORAL | Status: DC
  Administered 2013-09-22 – 2013-09-24 (×7): 1000 mg via ORAL
  Filled 2013-09-21 (×12): qty 2

## 2013-09-21 MED ORDER — PROPOFOL 10 MG/ML IV BOLUS
INTRAVENOUS | Status: DC | PRN
  Administered 2013-09-21: 100 mg via INTRAVENOUS
  Administered 2013-09-21: 30 mg via INTRAVENOUS

## 2013-09-21 MED ORDER — LACTATED RINGERS IV SOLN
INTRAVENOUS | Status: DC | PRN
  Administered 2013-09-21: 12:00:00 via INTRAVENOUS

## 2013-09-21 MED ORDER — ENOXAPARIN SODIUM 40 MG/0.4ML ~~LOC~~ SOLN
40.0000 mg | Freq: Every day | SUBCUTANEOUS | Status: DC
  Administered 2013-09-22 – 2013-09-24 (×3): 40 mg via SUBCUTANEOUS
  Filled 2013-09-21 (×3): qty 0.4

## 2013-09-21 MED ORDER — BISACODYL 5 MG PO TBEC
10.0000 mg | DELAYED_RELEASE_TABLET | Freq: Every day | ORAL | Status: DC
  Administered 2013-09-22 – 2013-09-23 (×2): 10 mg via ORAL
  Filled 2013-09-21 (×3): qty 2

## 2013-09-21 MED ORDER — 0.9 % SODIUM CHLORIDE (POUR BTL) OPTIME
TOPICAL | Status: DC | PRN
  Administered 2013-09-21: 3000 mL

## 2013-09-21 MED ORDER — ACETAMINOPHEN 160 MG/5ML PO SOLN
1000.0000 mg | Freq: Four times a day (QID) | ORAL | Status: DC
  Filled 2013-09-21: qty 40

## 2013-09-21 MED ORDER — SODIUM CHLORIDE 0.9 % IJ SOLN: 9.0000 mL | INTRAMUSCULAR | Status: DC | PRN

## 2013-09-21 MED ORDER — ONDANSETRON HCL 4 MG/2ML IJ SOLN
INTRAMUSCULAR | Status: DC | PRN
  Administered 2013-09-21: 4 mg via INTRAVENOUS

## 2013-09-21 MED ORDER — DEXAMETHASONE SODIUM PHOSPHATE 4 MG/ML IJ SOLN
INTRAMUSCULAR | Status: DC | PRN
  Administered 2013-09-21: 4 mg via INTRAVENOUS

## 2013-09-21 MED ORDER — DEXAMETHASONE SODIUM PHOSPHATE 4 MG/ML IJ SOLN
INTRAMUSCULAR | Status: AC
  Filled 2013-09-21: qty 1

## 2013-09-21 MED ORDER — BUPIVACAINE ON-Q PAIN PUMP (FOR ORDER SET NO CHG): INJECTION | Status: DC

## 2013-09-21 MED ORDER — ONDANSETRON HCL 4 MG/2ML IJ SOLN: 4.0000 mg | Freq: Once | INTRAMUSCULAR | Status: DC | PRN

## 2013-09-21 MED ORDER — FENTANYL CITRATE 0.05 MG/ML IJ SOLN
INTRAMUSCULAR | Status: AC
  Administered 2013-09-21: 50 ug
  Filled 2013-09-21: qty 2

## 2013-09-21 MED ORDER — FENTANYL CITRATE 0.05 MG/ML IJ SOLN
INTRAMUSCULAR | Status: DC | PRN
  Administered 2013-09-21: 50 ug via INTRAVENOUS
  Administered 2013-09-21 (×2): 100 ug via INTRAVENOUS
  Administered 2013-09-21 (×2): 50 ug via INTRAVENOUS

## 2013-09-21 MED ORDER — FENTANYL CITRATE 0.05 MG/ML IJ SOLN
INTRAMUSCULAR | Status: AC
  Filled 2013-09-21: qty 2

## 2013-09-21 MED ORDER — LEVALBUTEROL HCL 0.63 MG/3ML IN NEBU
0.6300 mg | INHALATION_SOLUTION | Freq: Four times a day (QID) | RESPIRATORY_TRACT | Status: DC
  Administered 2013-09-21 – 2013-09-24 (×9): 0.63 mg via RESPIRATORY_TRACT
  Filled 2013-09-21 (×18): qty 3

## 2013-09-21 MED ORDER — DEXTROSE 5 % IV SOLN
1.5000 g | Freq: Two times a day (BID) | INTRAVENOUS | Status: AC
  Administered 2013-09-21 – 2013-09-22 (×2): 1.5 g via INTRAVENOUS
  Filled 2013-09-21 (×3): qty 1.5

## 2013-09-21 MED ORDER — DIPHENHYDRAMINE HCL 50 MG/ML IJ SOLN: 12.5000 mg | Freq: Four times a day (QID) | INTRAMUSCULAR | Status: DC | PRN

## 2013-09-21 SURGICAL SUPPLY — 77 items
ADH SKN CLS APL DERMABOND .7 (GAUZE/BANDAGES/DRESSINGS) ×2
APL SKNCLS STERI-STRIP NONHPOA (GAUZE/BANDAGES/DRESSINGS)
BENZOIN TINCTURE PRP APPL 2/3 (GAUZE/BANDAGES/DRESSINGS) IMPLANT
CANISTER SUCTION 2500CC (MISCELLANEOUS) ×6 IMPLANT
CATH KIT ON Q 5IN SLV (PAIN MANAGEMENT) IMPLANT
CATH THORACIC 28FR (CATHETERS) ×2 IMPLANT
CATH THORACIC 36FR (CATHETERS) IMPLANT
CATH THORACIC 36FR RT ANG (CATHETERS) IMPLANT
CLIP TI MEDIUM 6 (CLIP) ×3 IMPLANT
CONN ST 1/4X3/8  BEN (MISCELLANEOUS)
CONN ST 1/4X3/8 BEN (MISCELLANEOUS) IMPLANT
CONN Y 3/8X3/8X3/8  BEN (MISCELLANEOUS)
CONN Y 3/8X3/8X3/8 BEN (MISCELLANEOUS) IMPLANT
CONT SPEC 4OZ CLIKSEAL STRL BL (MISCELLANEOUS) ×6 IMPLANT
COVER SURGICAL LIGHT HANDLE (MISCELLANEOUS) ×6 IMPLANT
DERMABOND ADVANCED (GAUZE/BANDAGES/DRESSINGS) ×1
DERMABOND ADVANCED .7 DNX12 (GAUZE/BANDAGES/DRESSINGS) ×2 IMPLANT
DRAIN CHANNEL 28F RND 3/8 FF (WOUND CARE) IMPLANT
DRAIN CHANNEL 32F RND 10.7 FF (WOUND CARE) IMPLANT
DRAPE LAPAROSCOPIC ABDOMINAL (DRAPES) ×3 IMPLANT
DRAPE WARM FLUID 44X44 (DRAPE) ×3 IMPLANT
DRILL BIT 5/64 (BIT) IMPLANT
DRILL BIT 7/64X5 (BIT) IMPLANT
DRSG TEGADERM 4X4.75 (GAUZE/BANDAGES/DRESSINGS) ×2 IMPLANT
ELECT REM PT RETURN 9FT ADLT (ELECTROSURGICAL) ×3
ELECTRODE REM PT RTRN 9FT ADLT (ELECTROSURGICAL) ×2 IMPLANT
GLOVE BIO SURGEON STRL SZ 6.5 (GLOVE) ×2 IMPLANT
GLOVE BIOGEL PI IND STRL 6.5 (GLOVE) ×2 IMPLANT
GLOVE BIOGEL PI INDICATOR 6.5 (GLOVE) ×2
GLOVE EUDERMIC 7 POWDERFREE (GLOVE) ×6 IMPLANT
GLOVE SURG SS PI 6.5 STRL IVOR (GLOVE) ×2 IMPLANT
GOWN STRL REUS W/ TWL LRG LVL3 (GOWN DISPOSABLE) ×5 IMPLANT
GOWN STRL REUS W/ TWL XL LVL3 (GOWN DISPOSABLE) ×2 IMPLANT
GOWN STRL REUS W/TWL LRG LVL3 (GOWN DISPOSABLE) ×9
GOWN STRL REUS W/TWL XL LVL3 (GOWN DISPOSABLE) ×3
HANDLE STAPLE ENDO GIA SHORT (STAPLE) ×1
KIT BASIN OR (CUSTOM PROCEDURE TRAY) ×3 IMPLANT
KIT ROOM TURNOVER OR (KITS) ×3 IMPLANT
NS IRRIG 1000ML POUR BTL (IV SOLUTION) ×8 IMPLANT
PACK CHEST (CUSTOM PROCEDURE TRAY) ×3 IMPLANT
PAD ARMBOARD 7.5X6 YLW CONV (MISCELLANEOUS) ×6 IMPLANT
RELOAD EGIA 60 MED/THCK PURPLE (STAPLE) ×9 IMPLANT
RELOAD STAPLE 60 BLK XTHK ART (STAPLE) IMPLANT
RELOAD STAPLE 60 MED/THCK ART (STAPLE) IMPLANT
RELOAD TRI 2.0 60 XTHK VAS SUL (STAPLE) ×9 IMPLANT
SEALANT SURG COSEAL 4ML (VASCULAR PRODUCTS) ×2 IMPLANT
SEALANT SURG COSEAL 8ML (VASCULAR PRODUCTS) IMPLANT
SOLUTION ANTI FOG 6CC (MISCELLANEOUS) ×1 IMPLANT
SPONGE GAUZE 4X4 12PLY (GAUZE/BANDAGES/DRESSINGS) ×5 IMPLANT
STAPLER ENDO GIA 12 SHRT THIN (STAPLE) IMPLANT
STAPLER ENDO GIA 12MM SHORT (STAPLE) ×2 IMPLANT
SUT PROLENE 3 0 SH DA (SUTURE) IMPLANT
SUT SILK  1 MH (SUTURE) ×2
SUT SILK 1 MH (SUTURE) ×4 IMPLANT
SUT SILK 2 0 SH (SUTURE) ×8 IMPLANT
SUT SILK 2 0SH CR/8 30 (SUTURE) IMPLANT
SUT SILK 3 0SH CR/8 30 (SUTURE) IMPLANT
SUT VIC AB 1 CTX 36 (SUTURE) ×3
SUT VIC AB 1 CTX36XBRD ANBCTR (SUTURE) ×2 IMPLANT
SUT VIC AB 2-0 CT1 27 (SUTURE)
SUT VIC AB 2-0 CT1 TAPERPNT 27 (SUTURE) IMPLANT
SUT VIC AB 2-0 CTX 36 (SUTURE) ×3 IMPLANT
SUT VIC AB 2-0 UR6 27 (SUTURE) IMPLANT
SUT VIC AB 3-0 MH 27 (SUTURE) IMPLANT
SUT VIC AB 3-0 SH 27 (SUTURE)
SUT VIC AB 3-0 SH 27X BRD (SUTURE) IMPLANT
SUT VIC AB 3-0 X1 27 (SUTURE) ×3 IMPLANT
SUT VICRYL 2 TP 1 (SUTURE) ×3 IMPLANT
SYSTEM SAHARA CHEST DRAIN ATS (WOUND CARE) ×3 IMPLANT
TAPE CLOTH SURG 4X10 WHT LF (GAUZE/BANDAGES/DRESSINGS) ×4 IMPLANT
TIP APPLICATOR SPRAY EXTEND 16 (VASCULAR PRODUCTS) IMPLANT
TOWEL OR 17X24 6PK STRL BLUE (TOWEL DISPOSABLE) ×3 IMPLANT
TOWEL OR 17X26 10 PK STRL BLUE (TOWEL DISPOSABLE) ×3 IMPLANT
TRAP SPECIMEN MUCOUS 40CC (MISCELLANEOUS) IMPLANT
TRAY FOLEY CATH 14FRSI W/METER (CATHETERS) ×3 IMPLANT
TUNNELER SHEATH ON-Q 11GX8 DSP (PAIN MANAGEMENT) IMPLANT
WATER STERILE IRR 1000ML POUR (IV SOLUTION) ×6 IMPLANT

## 2013-09-21 NOTE — Op Note (Signed)
CARDIOTHORACIC SURGERY OPERATIVE NOTE  09/21/2013 Claudia [name] [id number]  Surgeon:  Gaye Pollack, MD  First Assistant: Suzzanne Cloud, PA-C   Preoperative Diagnosis:  Right upper lobe lung nodule   Postoperative Diagnosis: Same  Procedure:  1. Right muscle-sparing thoracotomy 2. Wedge resection of right upper lobe lung nodule  Anesthesia:  General Endotracheal   Clinical History/Surgical Indication:  The patient is a 68 year old woman with a history of limited stage small cell lung cancer diagnosed in June 2011 and treated with chemotherapy and concurrent radiotherapy finishing in 10/2009, followed by prophylactic cranial irradiation completed in 01/2010. She had a CT scan of the chest on 07/28/2013 that showed a 11 x 8 mm slowly enlarging spiculated nodule in the RUL. There were several other nodular densities in the right lung that were unchanged. A PET scan on 08/30/2013 showed malignant range FDG uptake in this nodule. There is a nodular focus of malignant range FDG uptake along the medial margin of the radiation change in the left lung that could be inflammation or tumor. She denies any symptoms.  She has an enlarging RUL nodule that is hypermetabolic on PET scan and could be a new primary lung cancer or a recurrence of her small cell cancer. She has multiple other small nodules in the right lung that appear stable and are too small to characterize by PET. There is some hypermetabolic nodular activity in the are of prior radiation change in the left lung that could be inflammatory or recurrent cancer. There is no hypermetabolic adenopathy. I think we have to assume that the RUL lesion is a solitary cancer and remove it for diagnostic and therapeutic purposes. Hopefully this can be removed with a wedge resection if I can feel it because I don't think she is a candidate for right upper lobectomy with an FEV1 of 1.14 (52% predicted) and diffusion capacity of 51%  predicted.    Preparation:  The patient was seen in the preoperative holding area and the correct patient, correct operation, correct operative sidewere confirmed with the patient after reviewing the medical record and CT scan. The right side of the chest was signed by me. The consent was signed by me. Preoperative antibiotics were given.  The patient was taken back to the operating room and positioned supine on the operating room table. After being placed under general endotracheal anesthesia by the anesthesia team using a double lumen tube a foley catheter was placed. The patient was turned into the left lateral decubitus position. The chest was prepped with betadine soap and solution.  A surgical time-out was taken and the correct patient,operative side, and operative procedure were confirmed with the nursing and anesthesia staff.   Operative Procedure:  A short lateral muscle-sparing thoracotomy was performed. The pleural space was entered through the 4th intercostal space. There were some adhesions between the lung and the mediastinum medially related to previous XRT with some scarring of the lung in this location from exposure to the radiation field. The upper lobe was mobilized. The right upper lobe was palpated and the nodule found. This lesion was removed by wedge resection using surgical staplers. I tried to get the widest margin possible with the staple line close to the hilum. It was sent to pathology for frozen section of the lesion and the margin. Dr. Donato Heinz called the OR and said it was carcinoma but he could not tell if it was small cell or non-small cell. The tumor was right up against the  staple line. I could not get any more margin due to the proximity to the hilum and the patient was not a candidate for lobectomy due to her poor PFT's. There were a few other tiny nodules palpable deeper in the upper lobe but I could not wedge these out. They had been seen on CT scan and have been  stable.They were too small to characterize by PET. The staple line was coated with Coseal. An On-Q pain catheter was placed in a subpleural location spanning the 4th ICS. A 28 F chest tube was placed and advanced posteriorly to the apex. The ribs were re-approximated using #2 vicryl peri-costal sutures that were placed through the middle of the 5th rib after drilling small holes and then around the 4th rib. The muscles were returned to their normal position. The subcutaneous tissue was closed with 2-0 vicryl continuous suture. The skin was closed with 3-0 vicryl subcuticular suture. All sponge, needle, and instrument counts were reported correct at the end of the case. Dry sterile dressings were placed over the incisions and around the chest tubes which were connected to pleurevac suction. The patient was turned supine, extubated,then transported to the PACU in satisfactory and stable condition.

## 2013-09-21 NOTE — Anesthesia Procedure Notes (Signed)
Procedure Name: Intubation Date/Time: 09/21/2013 12:29 PM Performed by: Carola Frost Pre-anesthesia Checklist: Patient identified, Timeout performed, Emergency Drugs available, Suction available and Patient being monitored Patient Re-evaluated:Patient Re-evaluated prior to inductionOxygen Delivery Method: Circle system utilized Preoxygenation: Pre-oxygenation with 100% oxygen Intubation Type: IV induction Ventilation: Mask ventilation without difficulty Laryngoscope Size: Mac and 3 Grade View: Grade I Endobronchial tube: Double lumen EBT and 35 Fr Number of attempts: 1 Airway Equipment and Method: Stylet and Fiberoptic brochoscope Placement Confirmation: CO2 detector,  positive ETCO2,  ETT inserted through vocal cords under direct vision and breath sounds checked- equal and bilateral Secured at: 28 cm Tube secured with: Tape Dental Injury: Teeth and Oropharynx as per pre-operative assessment

## 2013-09-21 NOTE — Anesthesia Preprocedure Evaluation (Signed)
Anesthesia Evaluation  Patient identified by MRN, date of birth, ID band Patient awake    Reviewed: Allergy & Precautions, H&P , NPO status , Patient's Chart, lab work & pertinent test results  Airway       Dental   Pulmonary COPDformer smoker,          Cardiovascular     Neuro/Psych    GI/Hepatic GERD-  ,  Endo/Other    Renal/GU      Musculoskeletal   Abdominal   Peds  Hematology  (+) anemia ,   Anesthesia Other Findings CA Lung  Reproductive/Obstetrics                           Anesthesia Physical Anesthesia Plan  ASA: II  Anesthesia Plan: General   Post-op Pain Management:    Induction: Intravenous  Airway Management Planned: Double Lumen EBT  Additional Equipment: Arterial line and CVP  Intra-op Plan:   Post-operative Plan: Possible Post-op intubation/ventilation and Extubation in OR  Informed Consent: I have reviewed the patients History and Physical, chart, labs and discussed the procedure including the risks, benefits and alternatives for the proposed anesthesia with the patient or authorized representative who has indicated his/her understanding and acceptance.     Plan Discussed with:   Anesthesia Plan Comments:         Anesthesia Quick Evaluation

## 2013-09-21 NOTE — H&P (Signed)
[address]       [place] [address]             [phone]      Cardiothoracic Surgery History and Physical   PCP is Lujean Amel, MD Referring Provider is Curt Bears, MD    Chief Complaint   Patient presents with   .  Lung Lesion       RULobe...enlarging..CT ..Marland KitchenPET        HPI:   The patient is a 68 year old woman with a history of limited stage small cell lung cancer diagnosed in June 2011 and treated with chemotherapy and concurrent radiotherapy finishing in 10/2009, followed by prophylactic cranial irradiation completed in 01/2010. She had a CT scan of the chest on 07/28/2013 that showed a 11 x 8 mm slowly enlarging spiculated nodule in the RUL. There were several other nodular densities in the right lung that were unchanged. A PET scan on 08/30/2013 showed malignant range FDG uptake in this nodule. There is a nodular focus of malignant range FDG uptake along the medial margin of the radiation change in the left lung that could be inflammation or tumor. She denies any symptoms.    Past Medical History   Diagnosis  Date   .  COPD (chronic obstructive pulmonary disease)     .  Anemia     .  Bradycardia         mild,may be due to beta blocker therapy   .  Lymphocytic colitis     .  Lung cancer  2010       Dr. Julien Nordmann, finished chemo, sp radiation, left upper          Past Surgical History   Procedure  Laterality  Date   .  Neck surgery    1980's         Family History   Problem  Relation  Age of Onset   .  Brain cancer  Mother         brain cancer   .  Diabetes  Son     .  Heart disease  Paternal Grandfather     .  Breast cancer  Maternal Aunt     .  Emphysema  Father          Social History History   Substance Use Topics   .  Smoking status:  Former Smoker -- 1.00 packs/day for 35 years       Types:  Cigarettes       Quit date:  03/31/2007   .  Smokeless tobacco:  Never Used   .  Alcohol Use:  Yes         Comment:  occassional         Current Outpatient Prescriptions   Medication  Sig  Dispense  Refill   .  aspirin 81 MG tablet  Take 81 mg by mouth. "couple of times a week"         .  Coenzyme Q10 (CO Q-10) 100 MG CAPS  Take by mouth. "couple of times a week"         .  fish oil-omega-3 fatty acids 1000 MG capsule  Take 1 g by mouth. "couple of times a week"         .  fluticasone (FLONASE) 50 MCG/ACT nasal spray  Place 2 sprays into the nose as needed.         .  loratadine (  CLARITIN) 10 MG tablet  Take 10 mg by mouth as needed.          .  Probiotic Product (PROBIOTIC DAILY PO)  Take 1 tablet by mouth. "couple of times a week"             No current facility-administered medications for this visit.         Allergies   Allergen  Reactions   .  Azithromycin  Shortness Of Breath and Rash       Took Tussionex at same time Jan 2013.   .  Tussionex Pennkinetic Er [Hydrocod Polst-Cpm Polst Er]  Shortness Of Breath and Rash       Took Azithromycin at same time Jan 2013        Review of Systems  Constitutional: Negative for fever, chills, diaphoresis, activity change, appetite change, fatigue and unexpected weight change.  HENT: Negative.   Eyes: Negative.   Respiratory: Negative for cough and shortness of breath.   Cardiovascular: Negative for chest pain.  Gastrointestinal: Negative.   Endocrine: Negative.   Genitourinary: Negative.   Musculoskeletal: Negative.   Skin: Negative.   Allergic/Immunologic: Negative.   Neurological: Negative.   Hematological: Negative.   Psychiatric/Behavioral: Negative.       BP 146/82  Pulse 66  Resp 20  Ht 5\' 2"  (1.575 m)  Wt 133 lb (60.328 kg)  BMI 24.32 kg/m2  SpO2 97% Physical Exam  Constitutional: She is oriented to person, place, and time. She appears well-developed and well-nourished. No distress.  HENT:   Head: Normocephalic and atraumatic.   Mouth/Throat: Oropharynx is clear and moist.  Eyes: EOM are normal. Pupils are equal, round,  and reactive to light.  Neck: Normal range of motion. Neck supple. No JVD present. No thyromegaly present.  Cardiovascular: Normal rate, regular rhythm, normal heart sounds and intact distal pulses.    No murmur heard. Pulmonary/Chest: Effort normal and breath sounds normal. No respiratory distress. She has no wheezes. She has no rales. She exhibits no tenderness.  Abdominal: Soft. Bowel sounds are normal. She exhibits no distension and no mass. There is no tenderness.  Musculoskeletal: Normal range of motion. She exhibits no edema.  Lymphadenopathy:    She has no cervical adenopathy.  Neurological: She is alert and oriented to person, place, and time. No cranial nerve deficit.  Skin: Skin is warm and dry.  Psychiatric: She has a normal mood and affect.        Diagnostic Tests:   CLINICAL DATA: Lung cancer, restaging.   EXAM:   CT CHEST WITH CONTRAST   TECHNIQUE:   Multidetector CT imaging of the chest was performed during   intravenous contrast administration.   CONTRAST: 70mL OMNIPAQUE IOHEXOL 300 MG/ML SOLN   COMPARISON: 05/08/2013. 01/31/2013. CTs dating back to 08/05/2011.   FINDINGS:   Again noted are areas of bandlike scarring with associated   bronchiectasis in the medial left upper lobe and superior segment of   the left lower lobe. These areas are stable and compatible with post   treatment changes/scarring.   Small cluster of nodules in the right upper lobe again noted,   slightly improved in overall size and number. The largest nodule is   on image 26 measuring 3.7 mm compared to 5 mm previously. Tiny   clustered nodules more inferiorly in the right upper lobe on image   32, stable. 4 mm nodule in the right lower lobe on image 42, stable.   Small subpleural nodule  on image 46 is stable. Minimal density   anteriorly in the right middle lobe and lingula compatible with   scarring.   Within the anterior inferior medial right upper lobe, there is a   somewhat  spiculated nodule noted. This measures 8 mm in the same   axis measured previously when this measures 6 mm. This is measured   up on image 30 of today's study. On prior study, there appeared to   be a smaller nodule immediately adjacent/abutting this larger   nodule. On today's study, this appears more confluent into 1 nodule   with the overall largest diameter measuring up to 11 mm. When   measuring the prior study in this same axis, this area measured 10   mm previously. When comparing to studies dating back to 08/05/2011,   this area is slowly enlarging.   No new pulmonary nodules. Mild underlying centrilobular emphysema.   No pleural effusions or pericardial effusion. Insert Heart No   mediastinal, hilar, or axillary adenopathy. Chest wall soft tissues   are unremarkable.   2.3 cm low-density lesion within the right hepatic lobe is stable,   most likely cysts. No acute findings in the upper abdomen.   No acute bony abnormality or focal bone lesion.   IMPRESSION:   Post treatment changes in the medial left upper lobe and superior   segment of the left lower lobe, stable.   Several nodular densities in the right lung, most of which are   stable. There is a slowly enlarging and spiculated appearing nodule   in the anterior inferior right upper lobe, measuring 11 x 8 mm on   today's study compared to 10 x 6 mm previously. Recommend continued   close follow-up of this pulmonary nodule with repeat CT in 3-6   months or further characterization with PET CT.   Electronically Signed   By: Rolm Baptise M.D.   On: 07/28/2013 09:30      CLINICAL DATA: Subsequent treatment strategy for Lung cancer.   EXAM:   NUCLEAR MEDICINE PET SKULL BASE TO THIGH   TECHNIQUE:   7.2 mCi F-18 FDG was injected intravenously. Full-ring PET imaging   was performed from the skull base to thigh after the radiotracer. CT   data was obtained and used for attenuation correction and anatomic   localization.     FASTING BLOOD GLUCOSE: Value: 81 mg/dl   COMPARISON: 09/04/2009   FINDINGS:   NECK   No hypermetabolic lymph nodes in the neck.   CHEST   Anterior right upper lobe nodule measures 1.3 cm and has an SUV max   equal to 7.5, image 34. The other small nodules in the right lung   are too small to reliably characterize. Radiation changes identified   within the posterior and medial left lung. Nonspecific inflammatory   activity is identified within the area of radiation change. There is   a superimposed nodular focus of increased uptake within the medial   aspect of the radiation field which has an SUV max equal to 4.4,   image 66   ABDOMEN/PELVIS   No abnormal hypermetabolic activity within the liver, pancreas,   adrenal glands, or spleen. No hypermetabolic lymph nodes in the   abdomen or pelvis. Atherosclerotic disease involves the abdominal   aorta.   SKELETON   No focal hypermetabolic activity to suggest skeletal metastasis.   IMPRESSION:   1. There is malignant range FDG uptake associated with the right   upper lobe  nodule. This is worrisome for either synchronous right   upper lobe primary or metastatic disease. The other smaller nodules   in the right lung are too small to characterize.   2. There is a nodular focus of malignant range FDG uptake along the   medial margin of radiation change in the left lung. This is a   nonspecific focus and may reflect inflammation. Residual or   recurrent local tumor is not excluded.   Electronically Signed   By: Kerby Moors M.D.   On: 08/30/2013 13:02     Impression:   She has an enlarging RUL nodule that is hypermetabolic on PET scan and could be a new primary lung cancer or a recurrence of her small cell cancer. She has multiple other small nodules in the right lung that appear stable and are too small to characterize by PET. There is some hypermetabolic nodular activity in the are of prior radiation change in the left lung that  could be inflammatory or recurrent cancer. There is no hypermetabolic adenopathy. I think we have to assume that the RUL lesion is a solitary cancer and remove it for diagnostic and therapeutic purposes. Hopefully this can be removed with a wedge resection if I can feel it because I don't think she is a candidate for right upper lobectomy with an FEV1 of 1.14 (52% predicted) and diffusion capacity of 51% predicted.   Plan:   She will be scheduled for resection on Thursday 09/21/2013.

## 2013-09-21 NOTE — Brief Op Note (Signed)
09/21/2013  2:17 PM  PATIENT:  Claudia [name]  68 y.o. female  PRE-OPERATIVE DIAGNOSIS:  RUL NODULE  POST-OPERATIVE DIAGNOSIS:  RUL NODULE  PROCEDURE:  Procedure(s): THORACOTOMY MAJOR (Right) WEDGE RESECTION-RUL NODULE (Right)  SURGEON:  Surgeon(s) and Role:    * Gaye Pollack, MD - Primary  PHYSICIAN ASSISTANT: Suzzanne Cloud, PA-C     ANESTHESIA:   general  EBL:  Total I/O In: -  Out: 300 [Urine:275; Blood:25]  BLOOD ADMINISTERED:none  DRAINS: 1 29F Chest Tube(s) in the right pleural space   LOCAL MEDICATIONS USED:  MARCAINE     SPECIMEN:  Source of Specimen:  right upper lobe wedge  DISPOSITION OF SPECIMEN:  PATHOLOGY  Frozen section shows carcinoma per Dr. Donato Heinz. Type undetermined  COUNTS:  YES  TOURNIQUET:  * No tourniquets in log *  DICTATION: .Note written in [place]: Admit to inpatient   PATIENT DISPOSITION:  PACU - hemodynamically stable.   Delay start of Pharmacological VTE agent (>24hrs) due to surgical blood loss or risk of bleeding:  yes

## 2013-09-21 NOTE — Interval H&P Note (Signed)
History and Physical Interval Note:  09/21/2013 11:53 AM  Claudia [name]  has presented today for surgery, with the diagnosis of RUL NODULE  The various methods of treatment have been discussed with the patient and family. After consideration of risks, benefits and other options for treatment, the patient has consented to  Procedure(s): THORACOTOMY MAJOR (Right) WEDGE RESECTION-RUL NODULE (Right) POSSIBLE RIGHT UPPER LOBECTOMY (Right) as a surgical intervention .  The patient's history has been reviewed, patient examined, no change in status, stable for surgery.  I have reviewed the patient's chart and labs.  Questions were answered to the patient's satisfaction.     Gaye Pollack

## 2013-09-21 NOTE — Transfer of Care (Signed)
Immediate Anesthesia Transfer of Care Note  Patient: Claudia [name]  Procedure(s) Performed: Procedure(s): THORACOTOMY MAJOR (Right) RIGHT UPPER LOBE WEDGE RESECTION (Right)  Patient Location: PACU  Anesthesia Type:General  Level of Consciousness: awake, alert  and oriented  Airway & Oxygen Therapy: Patient Spontanous Breathing and Patient connected to nasal cannula oxygen  Post-op Assessment: Report given to PACU RN, Post -op Vital signs reviewed and stable and Patient moving all extremities X 4  Post vital signs: Reviewed and stable  Complications: No apparent anesthesia complications

## 2013-09-21 NOTE — Anesthesia Postprocedure Evaluation (Signed)
  Anesthesia Post-op Note  Patient: Claudia [name]  Procedure(s) Performed: Procedure(s): THORACOTOMY MAJOR (Right) RIGHT UPPER LOBE WEDGE RESECTION (Right)  Patient Location: PACU  Anesthesia Type:General  Level of Consciousness: awake, oriented, sedated and patient cooperative  Airway and Oxygen Therapy: Patient Spontanous Breathing  Post-op Pain: moderate  Post-op Assessment: Post-op Vital signs reviewed, Patient's Cardiovascular Status Stable, Respiratory Function Stable, Patent Airway and No signs of Nausea or vomiting  Post-op Vital Signs: stable  Last Vitals:  Filed Vitals:   09/21/13 1545  BP:   Pulse: 77  Temp:   Resp: 17    Complications: No apparent anesthesia complications

## 2013-09-21 NOTE — Progress Notes (Signed)
Patient ID: TEXAS OBORN, female   DOB: [birth year], 68 y.o.   MRN: [id number]  SICU Evening Rounds:  Hemodynamically stable  Awake and alert Pain under control Urine output ok CT output low. No air leak.

## 2013-09-22 ENCOUNTER — Encounter (HOSPITAL_COMMUNITY): Payer: Self-pay | Admitting: Surgery

## 2013-09-22 ENCOUNTER — Inpatient Hospital Stay (HOSPITAL_COMMUNITY): Payer: Medicare Other

## 2013-09-22 LAB — PULMONARY FUNCTION TEST
DL/VA % pred: 85 %
DL/VA: 3.87 ml/min/mmHg/L
DLCO cor % pred: 51 %
DLCO cor: 11.11 ml/min/mmHg
DLCO unc % pred: 51 %
DLCO unc: 11.11 ml/min/mmHg
FEF 25-75 Post: 0.72 L/s
FEF 25-75 Pre: 0.51 L/s
FEF2575-%Change-Post: 41 %
FEF2575-%Pred-Post: 37 %
FEF2575-%Pred-Pre: 26 %
FEV1-%Change-Post: 13 %
FEV1-%Pred-Post: 60 %
FEV1-%Pred-Pre: 52 %
FEV1-Post: 1.29 L
FEV1-Pre: 1.14 L
FEV1FVC-%Change-Post: 6 %
FEV1FVC-%Pred-Pre: 73 %
FEV6-%Change-Post: 7 %
FEV6-%Pred-Post: 79 %
FEV6-%Pred-Pre: 74 %
FEV6-Post: 2.16 L
FEV6-Pre: 2.01 L
FEV6FVC-%Change-Post: 0 %
FEV6FVC-%Pred-Post: 102 %
FEV6FVC-%Pred-Pre: 102 %
FVC-%Change-Post: 7 %
FVC-%Pred-Post: 77 %
FVC-%Pred-Pre: 72 %
FVC-Post: 2.2 L
FVC-Pre: 2.05 L
Post FEV1/FVC ratio: 59 %
Post FEV6/FVC ratio: 98 %
Pre FEV1/FVC ratio: 55 %
Pre FEV6/FVC Ratio: 98 %
RV % pred: 95 %
RV: 1.95 L
TLC % pred: 85 %
TLC: 4.04 L

## 2013-09-22 LAB — POCT I-STAT 3, ART BLOOD GAS (G3+)
Acid-Base Excess: 2 mmol/L (ref 0.0–2.0)
Bicarbonate: 28 mEq/L — ABNORMAL HIGH (ref 20.0–24.0)
O2 Saturation: 98 %
Patient temperature: 98.5
TCO2: 29 mmol/L (ref 0–100)
pCO2 arterial: 46.2 mmHg — ABNORMAL HIGH (ref 35.0–45.0)
pH, Arterial: 7.39 (ref 7.350–7.450)
pO2, Arterial: 106 mmHg — ABNORMAL HIGH (ref 80.0–100.0)

## 2013-09-22 LAB — BASIC METABOLIC PANEL
BUN: 9 mg/dL (ref 6–23)
CO2: 27 mEq/L (ref 19–32)
Calcium: 9.1 mg/dL (ref 8.4–10.5)
Chloride: 102 mEq/L (ref 96–112)
Creatinine, Ser: 0.77 mg/dL (ref 0.50–1.10)
GFR calc Af Amer: >90 mL/min (ref >90)
GFR calc non Af Amer: 85 mL/min — ABNORMAL LOW (ref >90)
Glucose, Bld: 121 mg/dL — ABNORMAL HIGH (ref 70–99)
Potassium: 3.9 mEq/L (ref 3.7–5.3)
Sodium: 140 mEq/L (ref 137–147)

## 2013-09-22 LAB — CBC
HCT: 32 % — ABNORMAL LOW (ref 36.0–46.0)
Hemoglobin: 10.4 g/dL — ABNORMAL LOW (ref 12.0–15.0)
MCH: 30 pg (ref 26.0–34.0)
MCHC: 32.5 g/dL (ref 30.0–36.0)
MCV: 92.2 fL (ref 78.0–100.0)
Platelets: 140 10*3/uL — ABNORMAL LOW (ref 150–400)
RBC: 3.47 MIL/uL — ABNORMAL LOW (ref 3.87–5.11)
RDW: 14 % (ref 11.5–15.5)
WBC: 9.5 10*3/uL (ref 4.0–10.5)

## 2013-09-22 NOTE — Progress Notes (Signed)
CT surgery p.m. Rounds  Patient examined and record reviewed.Hemodynamics stable,labs satisfactory.Patient had stable day.Continue current care. Claudia [name],Claudia [name] 09/22/2013

## 2013-09-22 NOTE — Progress Notes (Addendum)
[facility] DAILY ICU PROGRESS NOTE                   [address]            [place] [address]          [phone]   1 Day Post-Op Procedure(s) (LRB): THORACOTOMY MAJOR (Right) RIGHT UPPER LOBE WEDGE RESECTION (Right)  Total Length of Stay:  LOS: 1 day   Subjective: Comfortable, no complaints.  Rested well, breathing stable.   Objective: Vital signs in last 24 hours: Temp:  [97.5 F (36.4 C)-98.6 F (37 C)] 98.5 F (36.9 C) (06/26 0408) Pulse Rate:  [65-94] 78 (06/26 0700) Cardiac Rhythm:  [-] Normal sinus rhythm (06/26 0600) Resp:  [7-100] 16 (06/26 0700) BP: (93-157)/(7-67) 105/48 mmHg (06/26 0700) SpO2:  [96 %-100 %] 99 % (06/26 0700) Arterial Line BP: (106-185)/(31-73) 110/31 mmHg (06/26 0700) Weight:  [133 lb (60.328 kg)-134 lb 4.2 oz (60.9 kg)] 134 lb 4.2 oz (60.9 kg) (06/26 0500)  Filed Weights   09/21/13 0826 09/22/13 0500  Weight: 133 lb (60.328 kg) 134 lb 4.2 oz (60.9 kg)    Weight change:    Hemodynamic parameters for last 24 hours:    Intake/Output from previous day: 06/25 0701 - 06/26 0700 In: 2793.5 [P.O.:120; I.V.:2623.5; IV Piggyback:50] Out: 7425 [Urine:1565; Blood:25; Chest Tube:172]  Intake/Output this shift:    Current Meds: Scheduled Meds: . acetaminophen  1,000 mg Oral 4 times per day   Or  . acetaminophen (TYLENOL) oral liquid 160 mg/5 mL  1,000 mg Oral 4 times per day  . bisacodyl  10 mg Oral Daily  . cefUROXime (ZINACEF)  IV  1.5 g Intravenous Q12H  . enoxaparin (LOVENOX) injection  40 mg Subcutaneous Daily  . fentaNYL   Intravenous 6 times per day  . levalbuterol  0.63 mg Nebulization Q6H  . senna-docusate  1 tablet Oral QHS   Continuous Infusions: . bupivacaine ON-Q pain pump    . dextrose 5 % and 0.9% NaCl 75 mL/hr at 09/22/13 0400   PRN Meds:.diphenhydrAMINE, diphenhydrAMINE, hydrALAZINE, naloxone, ondansetron (ZOFRAN) IV, potassium chloride, sodium chloride   Physical Exam: General appearance: alert,  cooperative and no distress Heart: regular rate and rhythm Lungs: Coarse bilateral BS Wound: Dressed and dry Chest tube: No air leak with cough    Lab Results: CBC: Recent Labs  09/19/13 1440 09/22/13 0500  WBC 5.9 9.5  HGB 12.0 10.4*  HCT 36.4 32.0*  PLT 172 140*   BMET:  Recent Labs  09/19/13 1440 09/22/13 0500  NA 139 140  K 4.5 3.9  CL 103 102  CO2 21 27  GLUCOSE 90 121*  BUN 13 9  CREATININE 0.76 0.77  CALCIUM 10.4 9.1    PT/INR:  Recent Labs  09/19/13 1440  LABPROT 13.6  INR 1.06   Radiology: Dg Chest Port 1 View  09/21/2013   CLINICAL DATA:  Postop  EXAM: PORTABLE CHEST - 1 VIEW  COMPARISON:  08/30/2013  FINDINGS: Cardiomediastinal silhouette is stable. Right IJ Port-A-Cath in place. Persistent left hilar prominence and post radiation changes left perihilar region. There is a right chest tube in place. No pneumothorax. Subcutaneous emphysema noted right chest wall.  IMPRESSION: Right chest tube in place. No pneumothorax. Persistent left hilar prominence and postradiation changes in left upper lobe   Electronically Signed   By: Lahoma Crocker M.D.   On: 09/21/2013 14:59     Assessment/Plan: S/P Procedure(s) (LRB): THORACOTOMY MAJOR (Right)  RIGHT UPPER LOBE WEDGE RESECTION (Right) Chest tube output relatively low, no air leak.  Continue CT, may be able to decrease to water seal. Mobilize, work on pulm toilet, routine POD#1 progression. Hopefully can transfer to floor (3S) soon.  COLLINS,GINA H 09/22/2013 8:02 AM   Chart reviewed, patient examined, agree with above. Chest tube to water seal and remove tomorrow. Will keep in 2S today and transfer tomorrow to 2W after chest tube removed.

## 2013-09-23 ENCOUNTER — Inpatient Hospital Stay (HOSPITAL_COMMUNITY): Payer: Medicare Other

## 2013-09-23 LAB — BASIC METABOLIC PANEL
BUN: 8 mg/dL (ref 6–23)
CO2: 27 mEq/L (ref 19–32)
Calcium: 9.7 mg/dL (ref 8.4–10.5)
Chloride: 102 mEq/L (ref 96–112)
Creatinine, Ser: 0.77 mg/dL (ref 0.50–1.10)
GFR calc Af Amer: >90 mL/min (ref >90)
GFR calc non Af Amer: 85 mL/min — ABNORMAL LOW (ref >90)
Glucose, Bld: 98 mg/dL (ref 70–99)
Potassium: 3.7 mEq/L (ref 3.7–5.3)
Sodium: 140 mEq/L (ref 137–147)

## 2013-09-23 LAB — CBC
HCT: 32.8 % — ABNORMAL LOW (ref 36.0–46.0)
Hemoglobin: 10.8 g/dL — ABNORMAL LOW (ref 12.0–15.0)
MCH: 30.3 pg (ref 26.0–34.0)
MCHC: 32.9 g/dL (ref 30.0–36.0)
MCV: 92.1 fL (ref 78.0–100.0)
Platelets: 141 10*3/uL — ABNORMAL LOW (ref 150–400)
RBC: 3.56 MIL/uL — ABNORMAL LOW (ref 3.87–5.11)
RDW: 14.1 % (ref 11.5–15.5)
WBC: 10.4 10*3/uL (ref 4.0–10.5)

## 2013-09-23 MED ORDER — HYDROCODONE-ACETAMINOPHEN 5-325 MG PO TABS: 1.0000 | ORAL_TABLET | ORAL | Status: DC | PRN

## 2013-09-23 NOTE — Progress Notes (Signed)
Pt transported to 2W27. Pt ambulated without difficulty the entire way. VSS. RA. No complaints per pt. Pt's husband accompanied her on transport and has her belongings. Bedside handoff with RN.  Dellie Catholic, RN

## 2013-09-23 NOTE — Progress Notes (Signed)
2 Days Post-Op Procedure(s) (LRB): THORACOTOMY MAJOR (Right) RIGHT UPPER LOBE WEDGE RESECTION (Right) Subjective: Patient doing well after right wedge resection of a malignancy Chest tube is pulled today chest x-ray is satisfactory pulmonary status is satisfactory We will stop her PCA and transferred her to telemetry. Hopefully she'll be ready to go home in 48 hours  Objective: Vital signs in last 24 hours: Temp:  [98.1 F (36.7 C)-98.9 F (37.2 C)] 98.5 F (36.9 C) (06/27 1154) Pulse Rate:  [71-99] 80 (06/27 1200) Cardiac Rhythm:  [-] Normal sinus rhythm (06/27 1200) Resp:  [13-35] 15 (06/27 1200) BP: (88-138)/(44-78) 122/66 mmHg (06/27 1200) SpO2:  [90 %-100 %] 98 % (06/27 1200)  Hemodynamic parameters for last 24 hours:   afebrile sinus rhythm  Intake/Output from previous day: 06/26 0701 - 06/27 0700 In: 939.5 [P.O.:420; I.V.:369.5; IV Piggyback:150] Out: 4097 [Urine:2270; Chest Tube:200] Intake/Output this shift: Total I/O In: 77.5 [I.V.:27.5; IV Piggyback:50] Out: 353 [Urine:425; Chest Tube:140]  Exam Alert and pleasant Breath sounds clear Neuro intact  Lab Results:  Recent Labs  09/22/13 0500 09/23/13 0435  WBC 9.5 10.4  HGB 10.4* 10.8*  HCT 32.0* 32.8*  PLT 140* 141*   BMET:  Recent Labs  09/22/13 0500 09/23/13 0435  NA 140 140  K 3.9 3.7  CL 102 102  CO2 27 27  GLUCOSE 121* 98  BUN 9 8  CREATININE 0.77 0.77  CALCIUM 9.1 9.7    PT/INR: No results found for this basename: LABPROT, INR,  in the last 72 hours ABG    Component Value Date/Time   PHART 7.390 09/22/2013 0507   HCO3 28.0* 09/22/2013 0507   TCO2 29 09/22/2013 0507   O2SAT 98.0 09/22/2013 0507   CBG (last 3)  No results found for this basename: GLUCAP,  in the last 72 hours  Assessment/Plan: S/P Procedure(s) (LRB): THORACOTOMY MAJOR (Right) RIGHT UPPER LOBE WEDGE RESECTION (Right) Plan for transfer to step-down: see transfer orders   LOS: 2 days    VAN TRIGT  III,Nolan Lasser 09/23/2013

## 2013-09-24 ENCOUNTER — Inpatient Hospital Stay (HOSPITAL_COMMUNITY): Payer: Medicare Other

## 2013-09-24 DIAGNOSIS — Z9889 Other specified postprocedural states: Secondary | ICD-10-CM

## 2013-09-24 LAB — BASIC METABOLIC PANEL
BUN: 7 mg/dL (ref 6–23)
CO2: 28 mEq/L (ref 19–32)
Calcium: 9.9 mg/dL (ref 8.4–10.5)
Chloride: 101 mEq/L (ref 96–112)
Creatinine, Ser: 0.7 mg/dL (ref 0.50–1.10)
GFR calc Af Amer: >90 mL/min (ref >90)
GFR calc non Af Amer: 88 mL/min — ABNORMAL LOW (ref >90)
Glucose, Bld: 88 mg/dL (ref 70–99)
Potassium: 3.8 mEq/L (ref 3.7–5.3)
Sodium: 141 mEq/L (ref 137–147)

## 2013-09-24 LAB — CBC
HCT: 31.6 % — ABNORMAL LOW (ref 36.0–46.0)
Hemoglobin: 10.4 g/dL — ABNORMAL LOW (ref 12.0–15.0)
MCH: 30.1 pg (ref 26.0–34.0)
MCHC: 32.9 g/dL (ref 30.0–36.0)
MCV: 91.3 fL (ref 78.0–100.0)
Platelets: 136 10*3/uL — ABNORMAL LOW (ref 150–400)
RBC: 3.46 MIL/uL — ABNORMAL LOW (ref 3.87–5.11)
RDW: 13.8 % (ref 11.5–15.5)
WBC: 9.1 10*3/uL (ref 4.0–10.5)

## 2013-09-24 MED ORDER — LEVALBUTEROL HCL 0.63 MG/3ML IN NEBU: 0.6300 mg | INHALATION_SOLUTION | RESPIRATORY_TRACT | Status: DC | PRN

## 2013-09-24 MED ORDER — LEVALBUTEROL HCL 0.63 MG/3ML IN NEBU: 0.6300 mg | INHALATION_SOLUTION | Freq: Three times a day (TID) | RESPIRATORY_TRACT | Status: DC

## 2013-09-24 MED ORDER — HYDROCODONE-ACETAMINOPHEN 5-325 MG PO TABS: 1.0000 | ORAL_TABLET | ORAL | Status: DC | PRN

## 2013-09-24 NOTE — Progress Notes (Signed)
Pt amb 549ft with one assistant. Pt did not have any complaints. Will continue to monitor pt closely.

## 2013-09-24 NOTE — Progress Notes (Signed)
Central line removed per order. Occlusive dressing and pressure applied. Pt instructed of 30 minute bedrest. Verbalized understanding. Will continue to monitor pt closely.

## 2013-09-24 NOTE — Progress Notes (Addendum)
      [address]       [place] [address]             [phone]      3 Days Post-Op Procedure(s) (LRB): THORACOTOMY MAJOR (Right) RIGHT UPPER LOBE WEDGE RESECTION (Right)  Subjective:  Ms. Claudia [name] states she is feeling good.  She is ambulating without difficulty.    Objective: Vital signs in last 24 hours: Temp:  [97.2 F (36.2 C)-98.6 F (37 C)] 98.4 F (36.9 C) (06/28 0419) Pulse Rate:  [79-93] 82 (06/28 0419) Cardiac Rhythm:  [-] Normal sinus rhythm (06/27 2000) Resp:  [14-19] 18 (06/28 0419) BP: (96-152)/(49-82) 131/74 mmHg (06/28 0419) SpO2:  [92 %-98 %] 92 % (06/28 0419)  Intake/Output from previous day: 06/27 0701 - 06/28 0700 In: 397.5 [P.O.:320; I.V.:27.5; IV Piggyback:50] Out: 562 [Urine:775; Chest Tube:140]  General appearance: alert, cooperative and no distress Heart: regular rate and rhythm Lungs: clear to auscultation bilaterally Abdomen: soft, non-tender; bowel sounds normal; no masses,  no organomegaly Wound: clean and dry  Lab Results:  Recent Labs  09/23/13 0435 09/24/13 0508  WBC 10.4 9.1  HGB 10.8* 10.4*  HCT 32.8* 31.6*  PLT 141* 136*   BMET:  Recent Labs  09/23/13 0435 09/24/13 0508  NA 140 141  K 3.7 3.8  CL 102 101  CO2 27 28  GLUCOSE 98 88  BUN 8 7  CREATININE 0.77 0.70  CALCIUM 9.7 9.9    PT/INR: No results found for this basename: LABPROT, INR,  in the last 72 hours ABG    Component Value Date/Time   PHART 7.390 09/22/2013 0507   HCO3 28.0* 09/22/2013 0507   TCO2 29 09/22/2013 0507   O2SAT 98.0 09/22/2013 0507   CBG (last 3)  No results found for this basename: GLUCAP,  in the last 72 hours  Assessment/Plan: S/P Procedure(s) (LRB): THORACOTOMY MAJOR (Right) RIGHT UPPER LOBE WEDGE RESECTION (Right)  1. Pulm- CXR with stable appearance of apical pneumothorax and small right pleural effusion 2. Pain- well controlled 3. Dispo- patient looks great, states she is ready to go home today will  discuss with staff    LOS: 3 days    Ahmed Prima, ERIN 09/24/2013  patient examined and medical record reviewed,agree with above note. VAN TRIGT III,Willa Brocks 09/24/2013

## 2013-09-24 NOTE — Discharge Summary (Signed)
Physician Discharge Summary  Patient ID: Claudia [name] MRN: [id number] DOB/AGE: 1945-07-07 68 y.o.  Admit date: 09/21/2013 Discharge date: 09/24/2013  Admission Diagnoses:  Patient Active Problem List   Diagnosis Date Noted  . Lung cancer 09/21/2013  . Cough 04/14/2012  . Pneumonia 04/14/2011  . Troponin level elevated 04/10/2011  . Small cell lung cancer 04/10/2011  . Elevated brain natriuretic peptide (BNP) level 04/10/2011  . COPD (chronic obstructive pulmonary disease) 04/09/2011  . Leukocytosis 04/09/2011  . Hyponatremia 04/09/2011  . Anemia 04/09/2011  . Abnormal CXR 04/09/2011  . Rash 04/09/2011   Discharge Diagnoses:   Patient Active Problem List   Diagnosis Date Noted  . S/P thoracotomy 09/24/2013  . Lung cancer 09/21/2013  . Cough 04/14/2012  . Pneumonia 04/14/2011  . Troponin level elevated 04/10/2011  . Small cell lung cancer 04/10/2011  . Elevated brain natriuretic peptide (BNP) level 04/10/2011  . COPD (chronic obstructive pulmonary disease) 04/09/2011  . Leukocytosis 04/09/2011  . Hyponatremia 04/09/2011  . Anemia 04/09/2011  . Abnormal CXR 04/09/2011  . Rash 04/09/2011   Discharged Condition: good  History of Present Illness:   Claudia [name] is a 69 yo white female with limited stage small cell lung cancer which was diagnosed in June 2011.  She was treated with chemotherapy and radiotherapy.  Follow up CT scan of the chest was performed on 07/28/2013 that revealed a 11 x 8 mm slowly enlarging spiculated nodule in the right upper lobe.  There was several other nodular densities in the right lung that remained unchanged.  Further workup with PET CT scan on 08/30/13 showed malignant range FDG uptake in the nodule in addition to the medial margin of the nodule.  The patient has no symptoms of cough, dyspnea, and hemoptysis.  Due to these findings she was referred to [facility] for surgical evaluation  She was evaluated by Dr. Cyndia Bent who felt that due to her history the  nodule should be treated as solitary cancer and remove it for diagnostic purposes.  The risks and benefits of the procedure were explained to the patient and she was agreeable to proceed.   Hospital Course:   The patient presented to [facility] on 09/21/2013.  She was taken to the operating room and underwent a Right Sided Muscle Sparing Thoracotomy with wedge resection of the Right Upper Lobe Nodule.  She tolerated the procedure, was extubated and taken tot he SICU in stable condition.  During her stay in the SICU the patient did well.  Her chest tube output was low and did not exhibit an air leak.  Therefore, her chest tube was transitioned to water seal.  Follow up CXR showed a small apical space.  Her chest tube was removed without difficulty.  She was felt to be medically stable and transferred to the step down unit instable condition.  The patient continues to progress.  Her follow up CXR showed stable appearance of apical space.  Her pain is controlled with oral pain medication.  She is ambulating without difficulty.  She is felt medically stable for discharge today 09/24/2013.  Her final pathology is pending.    Significant Diagnostic Studies: nuclear medicine: PET CT  1. There is malignant range FDG uptake associated with the right  upper lobe nodule. This is worrisome for either synchronous right  upper lobe primary or metastatic disease. The other smaller nodules  in the right lung are too small to characterize.  2. There is a nodular focus of malignant range  FDG uptake along the  medial margin of radiation change in the left lung. This is a  nonspecific focus and may reflect inflammation. Residual or  recurrent local tumor is not excluded.   Treatments: surgery:   1. Right muscle-sparing thoracotomy  2. Wedge resection of right upper lobe lung nodule  Disposition: 01-Home or Self Care  Discharge Medications:     Medication List         aspirin EC 81 MG tablet  Take  81 mg by mouth. Couple times a week     fish oil-omega-3 fatty acids 1000 MG capsule  Take 1 g by mouth. "couple of times a week"     FLONASE 50 MCG/ACT nasal spray  Generic drug:  fluticasone  Place 2 sprays into both nostrils daily as needed for allergies or rhinitis.     HYDROcodone-acetaminophen 5-325 MG per tablet  Commonly known as:  NORCO/VICODIN  Take 1-2 tablets by mouth every 4 (four) hours as needed for moderate pain or severe pain.     loratadine 10 MG tablet  Commonly known as:  CLARITIN  Take 10 mg by mouth daily as needed for allergies.     PROBIOTIC DAILY PO  Take 1 tablet by mouth daily.           Follow-up Information   Follow up with Gaye Pollack, MD In 2 weeks. (Office will contact you with appointment)    Specialty:  Cardiothoracic Surgery   Contact information:   [place] [address] [phone]       Follow up with [facility] In 2 weeks. (Please get CXR 1 hour prior to your appointment with Dr. Cyndia Bent)    Contact information:   [place]       Signed: Burch Marchuk 09/24/2013, 11:04 AM

## 2013-09-24 NOTE — Discharge Instructions (Signed)
Thoracotomy, Care After Refer to this sheet in the next few weeks. These instructions provide you with information on caring for yourself after your procedure. Your health care provider may also give you more specific instructions. Your treatment has been planned according to current medical practices, but problems sometimes occur. Call your health care provider if you have any problems or questions after your procedure. WHAT TO EXPECT AFTER YOUR PROCEDURE After your procedure, it is typical to have the following sensations:  You may feel pain at the incision site.  You may be constipated from the pain medicine given and the change in your level of activity.  You may feel extremely tired. HOME CARE INSTRUCTIONS  Take over-the-counter or prescription medicines for pain, discomfort, or fever only as directed by your health care provider. It is very important to take pain relieving medicine before your pain becomes severe. You will be able to breathe and cough more comfortably if your pain is well controlled.  Take deep breaths. Deep breathing helps to keep your lungs inflated and protects against a lung infection (pneumonia).  Cough frequently. Even though coughing may cause discomfort, coughing is important to clear mucus (phlegm) and expand your lungs. Coughing helps prevent pneumonia. If it hurts to cough, hold a pillow against your chest when you cough. This may help with the discomfort.  Continue to use an incentive spirometer as directed. The use of an incentive spirometer helps to keep your lungs inflated and protects against pneumonia.  Change the bandages over your incision as needed or as directed by your health care provider.  Remove the bandages over your chest tube site as directed by your health care provider.  Resume your normal diet as directed. It is important to have adequate protein, calories, vitamins, and minerals to promote healing.  Prevent constipation.  Eat  high-fiber foods such as whole grain cereals and breads, brown rice, beans, and fresh fruits and vegetables.  Drink enough water and fluids to keep your urine clear or pale yellow. Avoid drinking beverages containing caffeine. Beverages containing caffeine can cause dehydration and harden your stool.  Talk to your health care provider about taking a stool softener or laxative.  Avoid lifting until you are instructed otherwise.  Do not drive until directed by your health care provider.  Do not drive while taking pain medicines (narcotics).  Do not bathe, swim, or use a hot tub until directed by your health care provider. You may shower instead. Gently wash the area of your incision with water and soap as directed. Do not use anything else to clean your incision except as directed by your health care provider.  Do not use any tobacco products including cigarettes, chewing tobacco, or electronic cigarettes.  Avoid secondhand smoke.  Schedule an appointment for stitch (suture) or staple removal as directed.  Schedule and attend all follow-up visits as directed by your health care provider. It is important to keep all your appointments.  Participate in pulmonary rehabilitation as directed by your health care provider.  Do not travel by airplane for 2 weeks after your chest tube is removed. SEEK MEDICAL CARE IF:  You are bleeding from your wounds.  Your heartbeat seems irregular.  You have redness, swelling, or increasing pain in the wounds.  There is pus coming from your wounds.  There is a bad smell coming from the wound or dressing.  You have a fever or chills.  You have nausea or are vomiting.  You have muscle aches. SEEK  IMMEDIATE MEDICAL CARE IF:  You have a rash.  You have difficulty breathing.  You have a reaction or side effect to medicines given.  You have persistent nausea.  You have lightheadedness or feel faint.  You have shortness of breath or chest  pain.  You have persistent pain. Document Released: 08/29/2010 Document Revised: 03/21/2013 Document Reviewed: 11/02/2012 [facility] Patient Information 2015 [place], [place]. This information is not intended to replace advice given to you by your health care provider. Make sure you discuss any questions you have with your health care provider.

## 2013-10-06 ENCOUNTER — Telehealth: Payer: Self-pay | Admitting: *Deleted

## 2013-10-06 ENCOUNTER — Other Ambulatory Visit: Payer: Self-pay | Admitting: *Deleted

## 2013-10-06 DIAGNOSIS — C3491 Malignant neoplasm of unspecified part of right bronchus or lung: Secondary | ICD-10-CM

## 2013-10-06 NOTE — Telephone Encounter (Signed)
Called pt per Dr. Worthy Flank request to update her on scan.  Dr. Julien Nordmann request pt to have MRI Brain scan due to recurrent lung cancer.  Pt is aware.  POF placed for MRI

## 2013-10-09 ENCOUNTER — Other Ambulatory Visit: Payer: Self-pay | Admitting: *Deleted

## 2013-10-09 ENCOUNTER — Telehealth: Payer: Self-pay | Admitting: Internal Medicine

## 2013-10-09 ENCOUNTER — Other Ambulatory Visit: Payer: Self-pay | Admitting: Surgery

## 2013-10-09 DIAGNOSIS — C3490 Malignant neoplasm of unspecified part of unspecified bronchus or lung: Secondary | ICD-10-CM

## 2013-10-09 NOTE — Telephone Encounter (Signed)
[facility] will [facility] re mri to scheduled at [facility]. no other orders per 7/10 or 7/13 pof. lmonvm for lung naviagator informing her that [facility] will contact pt.

## 2013-10-11 ENCOUNTER — Ambulatory Visit
Admission: RE | Admit: 2013-10-11 | Discharge: 2013-10-11 | Disposition: A | Discharge status: Home / Self Care | Payer: Medicare Other | Source: Ambulatory Visit | Attending: Surgery | Admitting: Surgery

## 2013-10-11 ENCOUNTER — Ambulatory Visit (INDEPENDENT_AMBULATORY_CARE_PROVIDER_SITE_OTHER): Payer: Self-pay | Admitting: Surgery

## 2013-10-11 ENCOUNTER — Encounter: Payer: Self-pay | Admitting: Surgery

## 2013-10-11 VITALS — BP 136/83 | HR 98 | Resp 20 | Ht 62.0 in | Wt 134.0 lb

## 2013-10-11 DIAGNOSIS — Z9889 Other specified postprocedural states: Secondary | ICD-10-CM

## 2013-10-11 DIAGNOSIS — C3411 Malignant neoplasm of upper lobe, right bronchus or lung: Secondary | ICD-10-CM

## 2013-10-11 DIAGNOSIS — Z09 Encounter for follow-up examination after completed treatment for conditions other than malignant neoplasm: Secondary | ICD-10-CM

## 2013-10-11 DIAGNOSIS — C3410 Malignant neoplasm of upper lobe, unspecified bronchus or lung: Secondary | ICD-10-CM

## 2013-10-11 DIAGNOSIS — C3490 Malignant neoplasm of unspecified part of unspecified bronchus or lung: Secondary | ICD-10-CM

## 2013-10-11 NOTE — Progress Notes (Signed)
      HPI:  Patient returns for routine postoperative follow-up having undergone right muscle-sparing thoracotomy and wedge resection of a right upper lobe lung nodule on 09/21/2013. The patient's early postoperative recovery while in the hospital was notable for an uncomplicated postop course. Since hospital discharge the patient reports that she has been feeling well with mild incisional discomfort at night. The final pathology showed poorly differentiated carcinoma with neuroendocrine features. This is most likely a recurrence of her prior small cell cancer. The tumor was immediately adjacent to the staple line although I resected as much lung as possible around the lesion.   Current Outpatient Prescriptions  Medication Sig Dispense Refill  . aspirin EC 81 MG tablet Take 81 mg by mouth. Couple times a week      . fish oil-omega-3 fatty acids 1000 MG capsule Take 1 g by mouth. "couple of times a week"      . fluticasone (FLONASE) 50 MCG/ACT nasal spray Place 2 sprays into both nostrils daily as needed for allergies or rhinitis.      Marland Kitchen HYDROcodone-acetaminophen (NORCO/VICODIN) 5-325 MG per tablet Take 1-2 tablets by mouth every 4 (four) hours as needed for moderate pain or severe pain.  30 tablet  0  . loratadine (CLARITIN) 10 MG tablet Take 10 mg by mouth daily as needed for allergies.       . Probiotic Product (PROBIOTIC DAILY PO) Take 1 tablet by mouth daily.        No current facility-administered medications for this visit.    Physical Exam: BP 136/83  Pulse 98  Resp 20  Ht 5\' 2"  (1.575 m)  Wt 134 lb (60.782 kg)  BMI 24.50 kg/m2  SpO2 97% She looks well Lungs are clear The chest incision is healing well. The chest tube suture was removed.  Diagnostic Tests:  CLINICAL DATA: Lung cancer.  EXAM:  CHEST 2 VIEW  COMPARISON: Chest radiograph of September 24, 2013.  FINDINGS:  There is stable masslike opacity at the left hilum with scarring and  retraction. Deviation of the  trachea to the left is again noted and  unchanged. Right internal jugular catheter line noted on prior exam  has been removed. No pneumothorax is noted. Mild right pleural  effusion is noted which is unchanged. Pleural thickening is seen  along the inferior portion the right lateral chest wall. This also  is unchanged.  IMPRESSION:  Stable left hilar prominence and scarring is noted compared to prior  exam. Stable mild right pleural effusion is noted with associated  pleural thickening. No pneumothorax is seen.  Electronically Signed  By: Sabino Dick M.D.  On: 10/11/2013 10:27   Impression:  Overall she is making a good recovery from her surgery. She is going to follow up with Dr. Julien Nordmann to discuss the need for further therapy.  Plan:  She will return to see me if she develops any problem with her chest incisions.

## 2013-10-12 ENCOUNTER — Telehealth: Payer: Self-pay | Admitting: Internal Medicine

## 2013-10-12 ENCOUNTER — Ambulatory Visit (HOSPITAL_BASED_OUTPATIENT_CLINIC_OR_DEPARTMENT_OTHER): Payer: Medicare Other

## 2013-10-12 DIAGNOSIS — C3410 Malignant neoplasm of upper lobe, unspecified bronchus or lung: Secondary | ICD-10-CM

## 2013-10-12 DIAGNOSIS — C3490 Malignant neoplasm of unspecified part of unspecified bronchus or lung: Secondary | ICD-10-CM

## 2013-10-12 LAB — CBC WITH DIFFERENTIAL/PLATELET
BASO%: 0.4 % (ref 0.0–2.0)
Basophils Absolute: 0 10*3/uL (ref 0.0–0.1)
EOS%: 5.4 % (ref 0.0–7.0)
Eosinophils Absolute: 0.4 10*3/uL (ref 0.0–0.5)
HCT: 33.8 % — ABNORMAL LOW (ref 34.8–46.6)
HGB: 10.9 g/dL — ABNORMAL LOW (ref 11.6–15.9)
LYMPH%: 7.1 % — ABNORMAL LOW (ref 14.0–49.7)
MCH: 29.3 pg (ref 25.1–34.0)
MCHC: 32.2 g/dL (ref 31.5–36.0)
MCV: 90.9 fL (ref 79.5–101.0)
MONO#: 1 10*3/uL — ABNORMAL HIGH (ref 0.1–0.9)
MONO%: 14.6 % — ABNORMAL HIGH (ref 0.0–14.0)
NEUT#: 5.1 10*3/uL (ref 1.5–6.5)
NEUT%: 72.5 % (ref 38.4–76.8)
Platelets: 220 10*3/uL (ref 145–400)
RBC: 3.72 10*6/uL (ref 3.70–5.45)
RDW: 13.7 % (ref 11.2–14.5)
WBC: 7.1 10*3/uL (ref 3.9–10.3)
lymph#: 0.5 10*3/uL — ABNORMAL LOW (ref 0.9–3.3)

## 2013-10-12 LAB — COMPREHENSIVE METABOLIC PANEL (CC13)
ALT: 83 U/L — ABNORMAL HIGH (ref 0–55)
AST: 66 U/L — ABNORMAL HIGH (ref 5–34)
Albumin: 3.4 g/dL — ABNORMAL LOW (ref 3.5–5.0)
Alkaline Phosphatase: 181 U/L — ABNORMAL HIGH (ref 40–150)
Anion Gap: 7 mEq/L (ref 3–11)
BUN: 14.2 mg/dL (ref 7.0–26.0)
CO2: 28 mEq/L (ref 22–29)
Calcium: 10.3 mg/dL (ref 8.4–10.4)
Chloride: 105 mEq/L (ref 98–109)
Creatinine: 0.8 mg/dL (ref 0.6–1.1)
Glucose: 80 mg/dl (ref 70–140)
Potassium: 4.1 mEq/L (ref 3.5–5.1)
Sodium: 140 mEq/L (ref 136–145)
Total Bilirubin: 0.41 mg/dL (ref 0.20–1.20)
Total Protein: 6.9 g/dL (ref 6.4–8.3)

## 2013-10-12 NOTE — Telephone Encounter (Signed)
pt came in to have labs today...done

## 2013-10-13 ENCOUNTER — Other Ambulatory Visit: Payer: Self-pay | Admitting: Internal Medicine

## 2013-10-13 ENCOUNTER — Ambulatory Visit (HOSPITAL_COMMUNITY)
Admission: RE | Admit: 2013-10-13 | Discharge: 2013-10-13 | Disposition: A | Discharge status: Home / Self Care | Payer: Medicare Other | Source: Ambulatory Visit | Attending: Internal Medicine | Admitting: Internal Medicine

## 2013-10-13 DIAGNOSIS — C3490 Malignant neoplasm of unspecified part of unspecified bronchus or lung: Secondary | ICD-10-CM | POA: Insufficient documentation

## 2013-10-13 DIAGNOSIS — C3491 Malignant neoplasm of unspecified part of right bronchus or lung: Secondary | ICD-10-CM

## 2013-10-13 MED ORDER — GADOBENATE DIMEGLUMINE 529 MG/ML IV SOLN
12.0000 mL | Freq: Once | INTRAVENOUS | Status: AC | PRN
  Administered 2013-10-13: 12 mL via INTRAVENOUS

## 2013-10-17 ENCOUNTER — Ambulatory Visit: Payer: Medicare Other

## 2013-10-17 ENCOUNTER — Other Ambulatory Visit: Payer: Medicare Other

## 2013-10-17 ENCOUNTER — Encounter: Payer: Self-pay | Admitting: Internal Medicine

## 2013-10-17 ENCOUNTER — Ambulatory Visit (HOSPITAL_BASED_OUTPATIENT_CLINIC_OR_DEPARTMENT_OTHER): Payer: Medicare Other | Admitting: Internal Medicine

## 2013-10-17 ENCOUNTER — Telehealth: Payer: Self-pay | Admitting: Internal Medicine

## 2013-10-17 VITALS — BP 137/57 | HR 78 | Temp 98.3°F | Resp 18 | Ht 62.0 in | Wt 130.8 lb

## 2013-10-17 DIAGNOSIS — C3410 Malignant neoplasm of upper lobe, unspecified bronchus or lung: Secondary | ICD-10-CM

## 2013-10-17 DIAGNOSIS — C3491 Malignant neoplasm of unspecified part of right bronchus or lung: Secondary | ICD-10-CM

## 2013-10-17 DIAGNOSIS — J449 Chronic obstructive pulmonary disease, unspecified: Secondary | ICD-10-CM

## 2013-10-17 MED ORDER — PROCHLORPERAZINE MALEATE 10 MG PO TABS: 10.0000 mg | ORAL_TABLET | Freq: Four times a day (QID) | ORAL | Status: DC | PRN

## 2013-10-17 NOTE — Progress Notes (Signed)
[facility] [phone]   Fax:([phone]) [place], [place] [id number]  PRINCIPAL DIAGNOSIS: recurrent small cell lung cancer initially diagnosed as Limited stage small cell lung cancer in June 2011.   PRIOR THERAPY:  1. Status post 4 cycles of systemic chemotherapy with carboplatin and etoposide. Last dose was given 11/14/2009. This was concurrent with radiotherapy. 2. Status post prophylactic cranial irradiation completed January 28, 2010. 3. Status post wedge resection of the right upper lobe lung nodule under the care of Dr. Cyndia Bent on 09/21/2013 and the final pathology was consistent with small cell lung cancer.  CURRENT THERAPY: systemic chemotherapy with carboplatin for AUC of 5 on day 1 and etoposide 120 mg/M2 on days 1, 2 and 3 with Neulasta support on day 4 every 3 weeks. First dose on 10/30/2013.  INTERVAL HISTORY: Claudia [name] 68 y.o. female returns to the clinic today for followup visit accompanied by her husband. The patient was recently found to have an enlarging right upper lobe lung nodule and she underwent right upper lobe wedge resection of the nodule and the final pathology was consistent with small cell lung cancer. Her last PET scan on 08/30/2013 showed also smaller nodules in the right lung that were too small to characterize. The patient recently underwent MRI of the brain that was negative for brain metastasis. She is feeling fine and recovering from her recent surgery fairly well except for pain at the surgical scar. She denied having any significant shortness of breath, cough or hemoptysis. She denied having any significant weight loss or night sweats.  MEDICAL HISTORY: Past Medical History  Diagnosis Date  . COPD (chronic obstructive pulmonary disease)   . Anemia   . Bradycardia     mild,may be due to beta blocker therapy  . Lymphocytic colitis   . Lung  cancer 2010    Dr. Julien Nordmann, finished chemo, sp radiation, left upper   . GERD (gastroesophageal reflux disease)     otc  . Arthritis   . Pneumonia     ALLERGIES:  is allergic to azithromycin and tussionex pennkinetic er.  MEDICATIONS:  Current Outpatient Prescriptions  Medication Sig Dispense Refill  . aspirin EC 81 MG tablet Take 81 mg by mouth. Couple times a week      . HYDROcodone-acetaminophen (NORCO/VICODIN) 5-325 MG per tablet Take 1-2 tablets by mouth every 4 (four) hours as needed for moderate pain or severe pain.  30 tablet  0  . Probiotic Product (PROBIOTIC DAILY PO) Take 1 tablet by mouth daily.       . fish oil-omega-3 fatty acids 1000 MG capsule Take 1 g by mouth. "couple of times a week"      . fluticasone (FLONASE) 50 MCG/ACT nasal spray Place 2 sprays into both nostrils daily as needed for allergies or rhinitis.      Marland Kitchen loratadine (CLARITIN) 10 MG tablet Take 10 mg by mouth daily as needed for allergies.        No current facility-administered medications for this visit.    SURGICAL HISTORY:  Past Surgical History  Procedure Laterality Date  . Neck surgery  1980's  . Dilation and curettage of uterus    . Uterine fibroid surgery  2012  . Bronchoscopy  2011  . Thoracotomy Right 09/21/2013    Procedure: THORACOTOMY MAJOR;  Surgeon: Gaye Pollack, MD;  Location: [facility];  Service: Thoracic;  Laterality:  Right;  . Wedge resection Right 09/21/2013    Procedure: RIGHT UPPER LOBE WEDGE RESECTION;  Surgeon: Gaye Pollack, MD;  Location: [facility];  Service: Thoracic;  Laterality: Right;    REVIEW OF SYSTEMS:  Constitutional: negative Eyes: negative Ears, nose, mouth, throat, and face: negative Respiratory: positive for pleurisy/chest pain Cardiovascular: negative Gastrointestinal: negative Genitourinary:negative Integument/breast: negative Hematologic/lymphatic: negative Musculoskeletal:negative Neurological: negative Behavioral/Psych: negative Endocrine:  negative Allergic/Immunologic: negative   PHYSICAL EXAMINATION: General appearance: alert, cooperative and no distress Head: Normocephalic, without obvious abnormality, atraumatic Neck: no adenopathy Lymph nodes: Cervical, supraclavicular, and axillary nodes normal. Resp: clear to auscultation bilaterally Back: symmetric, no curvature. ROM normal. No CVA tenderness. Cardio: regular rate and rhythm, S1, S2 normal, no murmur, click, rub or gallop GI: soft, non-tender; bowel sounds normal; no masses,  no organomegaly Extremities: extremities normal, atraumatic, no cyanosis or edema Neurologic: Alert and oriented X 3, normal strength and tone. Normal symmetric reflexes. Normal coordination and gait  ECOG PERFORMANCE STATUS: 0 - Asymptomatic  Blood pressure 137/57, pulse 78, temperature 98.3 F (36.8 C), temperature source Oral, resp. rate 18, height 5\' 2"  (1.575 m), weight 130 lb 12.8 oz (59.33 kg), SpO2 98.00%.  LABORATORY DATA: Lab Results  Component Value Date   WBC 7.1 10/12/2013   HGB 10.9* 10/12/2013   HCT 33.8* 10/12/2013   MCV 90.9 10/12/2013   PLT 220 10/12/2013      Chemistry      Component Value Date/Time   NA 140 10/12/2013 1058   NA 141 09/24/2013 0508   NA 140 08/05/2011 0823   K 4.1 10/12/2013 1058   K 3.8 09/24/2013 0508   K 4.2 08/05/2011 0823   CL 101 09/24/2013 0508   CL 103 08/03/2012 0842   CL 98 08/05/2011 0823   CO2 28 10/12/2013 1058   CO2 28 09/24/2013 0508   CO2 30 08/05/2011 0823   BUN 14.2 10/12/2013 1058   BUN 7 09/24/2013 0508   BUN 13 08/05/2011 0823   CREATININE 0.8 10/12/2013 1058   CREATININE 0.70 09/24/2013 0508   CREATININE 1.0 08/05/2011 0823      Component Value Date/Time   CALCIUM 10.3 10/12/2013 1058   CALCIUM 9.9 09/24/2013 0508   CALCIUM 9.4 08/05/2011 0823   ALKPHOS 181* 10/12/2013 1058   ALKPHOS 80 09/19/2013 1440   ALKPHOS 69 08/05/2011 0823   AST 66* 10/12/2013 1058   AST 17 09/19/2013 1440   AST 19 08/05/2011 0823   ALT 83* 10/12/2013 1058   ALT 8  09/19/2013 1440   ALT 16 08/05/2011 0823   BILITOT 0.41 10/12/2013 1058   BILITOT 0.3 09/19/2013 1440   BILITOT 0.70 08/05/2011 0823       RADIOGRAPHIC STUDIES:  Dg Chest 2 View  10/11/2013   CLINICAL DATA:  Lung cancer.  EXAM: CHEST  2 VIEW  COMPARISON:  Chest radiograph of September 24, 2013.  FINDINGS: There is stable masslike opacity at the left hilum with scarring and retraction. Deviation of the trachea to the left is again noted and unchanged. Right internal jugular catheter line noted on prior exam has been removed. No pneumothorax is noted. Mild right pleural effusion is noted which is unchanged. Pleural thickening is seen along the inferior portion the right lateral chest wall. This also is unchanged.  IMPRESSION: Stable left hilar prominence and scarring is noted compared to prior exam. Stable mild right pleural effusion is noted with associated pleural thickening. No pneumothorax is seen.   Electronically Signed  By: Sabino Dick M.D.   On: 10/11/2013 10:27   Dg Chest 2 View  09/24/2013   CLINICAL DATA:  R VATS  EXAM: CHEST - 2 VIEW  COMPARISON:  the previous day's study  FINDINGS: Stable right IJ central line to the mid SVC. Small right apical pneumothorax, pleural line projecting at the level of the posterior margin third rib. Stable right lateral subcutaneous emphysema. The right chest tube has been removed. Small right pleural effusion persists. Masslike opacity at the left hilum with parenchymal distortion and retraction as before. Heart size remains normal.  Visualized skeletal structures are unremarkable.  IMPRESSION: 1. Right chest tube removal with stable apical pneumothorax and small right effusion.   Electronically Signed   By: Arne Cleveland M.D.   On: 09/24/2013 07:19   Mr Jeri Cos FU Contrast  10/13/2013   CLINICAL DATA:  Small-cell lung cancer.  Restaging.  EXAM: MRI HEAD WITHOUT AND WITH CONTRAST  TECHNIQUE: Multiplanar, multiecho pulse sequences of the brain and surrounding  structures were obtained without and with intravenous contrast.  CONTRAST:  9mL MULTIHANCE GADOBENATE DIMEGLUMINE 529 MG/ML IV SOLN  COMPARISON:  09/08/2010.  FINDINGS: No acute or subacute infarction. The brain shows scattered foci of T2 and FLAIR signal within the hemispheric white matter consistent with minimal chronic small vessel disease. No cortical or large vessel infarction. No primary or metastatic mass lesion. No abnormal enhancement. No hemorrhage, hydrocephalus or extra-axial collection. No skull or skullbase lesion. No pituitary mass. No sinus disease.  IMPRESSION: No metastatic disease.  Minimal chronic small vessel change of the hemispheric white matter.   Electronically Signed   By: Nelson Chimes M.D.   On: 10/13/2013 19:34   Dg Chest Port 1 View  09/23/2013   CLINICAL DATA:  History of lung cancer status post wedge resection.  EXAM: PORTABLE CHEST - 1 VIEW  COMPARISON:  Chest x-ray 09/22/2013.  FINDINGS: Right internal jugular central venous catheter with tip terminating in the mid superior vena cava. Right-sided chest tube remains in position with tip in the lateral aspect of the apex of the right hemithorax. Small right apical pneumothorax occupying approximately 5% of the volume of the right hemithorax is now noted. Masslike opacity perihilar region and medial aspect of the left upper lobe related to prior radiation therapy, similar to recent prior studies. No acute consolidative airspace disease. Mild blunting of the right costophrenic sulcus may suggest a trace amount of right-sided pleural fluid is well. No evidence of pulmonary edema. Heart size is normal. Mediastinal contours remain distorted. Subcutaneous emphysema in the right chest wall appears slightly decreased.  IMPRESSION: 1. Support apparatus, as above. 2. Interval reaccumulation of a small right pneumothorax in the right apex (less than 5% of the volume of the right hemithorax). There may also be a trace amount of right-sided  pleural fluid (i.e., this may represent a hydropneumothorax). 3. Otherwise, the radiographic appearance the chest is largely unchanged, as above. These results will be called to the ordering clinician or representative by the Radiologist Assistant, and communication documented in the PACS or zVision Dashboard.   Electronically Signed   By: Vinnie Langton M.D.   On: 09/23/2013 10:24   Dg Chest Port 1 View  09/22/2013   CLINICAL DATA:  Post vats  EXAM: PORTABLE CHEST - 1 VIEW  COMPARISON:  09/21/2013  FINDINGS: Right chest tube remains in place with no pneumothorax. Right central line tip is in the SVC, unchanged. Heart is normal size. Subcutaneous air within  the right chest wall is stable. Stable fullness of the left hilum with post radiation changes. This is unchanged. No visible effusions. No acute bony abnormality.  IMPRESSION: Stable exam.  No pneumothorax.   Electronically Signed   By: Rolm Baptise M.D.   On: 09/22/2013 08:09   Dg Chest Port 1 View  09/21/2013   CLINICAL DATA:  Postop  EXAM: PORTABLE CHEST - 1 VIEW  COMPARISON:  08/30/2013  FINDINGS: Cardiomediastinal silhouette is stable. Right IJ Port-A-Cath in place. Persistent left hilar prominence and post radiation changes left perihilar region. There is a right chest tube in place. No pneumothorax. Subcutaneous emphysema noted right chest wall.  IMPRESSION: Right chest tube in place. No pneumothorax. Persistent left hilar prominence and postradiation changes in left upper lobe   Electronically Signed   By: Lahoma Crocker M.D.   On: 09/21/2013 14:59   ASSESSMENT AND PLAN: This is a very pleasant 68 years old white female with limited stage small cell lung cancer diagnosed in June of 2011 status post 4 cycles of systemic chemotherapy with carboplatin and etoposide concurrent with radiation followed by prophylactic cranial irradiation and has been observation since November 2007. She recently underwent right upper lobe wedge resection of the pulmonary  nodules and the final pathology was consistent with recurrent small cell lung cancer. I had a lengthy discussion with the patient and her husband today about her current disease status and treatment options. I recommended for the patient's systemic chemotherapy again with carboplatin for AUC of 5 on day 1 and etoposide 120 mg/M2 on days 1, 2 and 3 with Neulasta support on day 4. I discussed with the patient adverse effect of the chemotherapy including but not limited to alopecia, myelosuppression, nausea and vomiting, peripheral neuropathy, liver or renal dysfunction. She would like to proceed with treatment as planned and she gives a verbal consent. She is expected to start the first cycle of this treatment on 10/30/2013. I will call her pharmacy was prescription for Compazine 10 mg by mouth every 6 hours as needed for nausea. She would come back for follow up visit in 3 weeks for reevaluation and management any adverse effect of her treatment. She was advised to call immediately if she has any concerning symptoms in the interval  All questions were answered. The patient knows to call the clinic with any problems, questions or concerns. We can certainly see the patient much sooner if necessary.  Disclaimer: This note was dictated with voice recognition software. Similar sounding words can inadvertently be transcribed and may not be corrected upon review.

## 2013-10-17 NOTE — Telephone Encounter (Signed)
gv adn rpinted appt sched and avs for pt for Aug....sed added tx.

## 2013-10-23 ENCOUNTER — Ambulatory Visit (INDEPENDENT_AMBULATORY_CARE_PROVIDER_SITE_OTHER): Payer: Medicare Other | Admitting: Critical Care Medicine

## 2013-10-23 ENCOUNTER — Telehealth: Payer: Self-pay | Admitting: Emergency Medicine

## 2013-10-23 ENCOUNTER — Encounter: Payer: Self-pay | Admitting: Critical Care Medicine

## 2013-10-23 VITALS — BP 116/68 | HR 101 | Temp 98.3°F | Ht 62.0 in | Wt 130.4 lb

## 2013-10-23 DIAGNOSIS — J441 Chronic obstructive pulmonary disease with (acute) exacerbation: Secondary | ICD-10-CM

## 2013-10-23 MED ORDER — HYDROCODONE-HOMATROPINE 5-1.5 MG/5ML PO SYRP: 5.0000 mL | ORAL_SOLUTION | Freq: Four times a day (QID) | ORAL | Status: DC | PRN

## 2013-10-23 MED ORDER — OMEPRAZOLE 20 MG PO CPDR: 20.0000 mg | DELAYED_RELEASE_CAPSULE | Freq: Every day | ORAL | Status: DC

## 2013-10-23 MED ORDER — LORATADINE 10 MG PO TABS: 10.0000 mg | ORAL_TABLET | Freq: Every day | ORAL | Status: DC

## 2013-10-23 MED ORDER — FLUTICASONE PROPIONATE 50 MCG/ACT NA SUSP: 2.0000 | Freq: Every day | NASAL | Status: DC

## 2013-10-23 NOTE — Assessment & Plan Note (Signed)
COPD with acute exacerbation secondary to high level reflux was microaspiration Plan Use hycodan cough syrup 5ML as needed for cough Start omeprazole one daily before breakfast 65minutes then eat for one month then STOP Follow reflux diet STOP fish oil Take flonase two puff ea nostril daily Take claritin daily No antibiotics needed Ok to take chemo Return to Dr byrum on schedule

## 2013-10-23 NOTE — Progress Notes (Signed)
Subjective:    Patient ID: Claudia [name], female    DOB: [birth year], 68 y.o.   MRN: [id number]  HPI  Subjective:    Patient ID: Claudia [name], female    DOB: 03/11/1946, 68 y.o.   MRN: [id number]  HPI 68 yo former smoker with a history of COPD and small cell lung cancer diagnosed by Dr. Arlyce Dice in June 2001 and treated by Dr. Earlie Server and Dr. Tammi Klippel. She was admitted to the hospital in early January 2013 for an exacerbation of COPD vs CAP. Her CT scan of the chest revealed no pulmonary embolism, radiation changes and scarring in the left upper lobe, stable pulmonary nodularity as detailed below, and some left sided loculated pleural fluid. She presents today as new consult. She denies any current breathing difficulty. She gets serial CT scans through Dr Earlie Server. She was weak after discharge, now feels at baseline. She usually walks daily, does yoga. She is able to exert.   ROV 10/28/11 -- follow up for dyspnea and presumed COPD based on an AE that happened in 1/13. Also with small cell lung CA, followed by Dr Julien Nordmann. Next CT scan in November. She denies any dyspnea, has a good exercise tolerance.  At this time she would like to defer PFT and BD's.   ROV 04/14/12 -- presumed COPD, hx SCLCA. Last CTscan was 02/05/12, stable. She has had a dry cough for about a week. Non-productive. She is having clear nasal drainage, bothers her in the winter. She ? Whether she may have had a URI. She has been Nyquil - didn;t take it last night, resulted in congestion, nasal gtt, cough. Some occasional eye pressure, no real HA. Occasional GERD. No new exposures, although it was damp during her last trip to beach house. This has happened in January before.   ROV 07/26/12 -- COPD, hx SCLCA (last CT scan 11/13). At last OV she was having trouble with PND and cough - tried [place], loratadine, fluticasone nasal spray. She is using these prn for now. Remains active on no BD's. She is due for a CT scan chest next month, to  be followed by Dr Julien Nordmann.   ROV 08/03/13 -- COPD, hx SCLCA, PND and cough. Follows with Dr Julien Nordmann. Her CT scan 5/1 shows stable nodules with a RUL nodule that looks a bit more lobulated and slightly larger to 30mm. She is going to have a PET scan to risk stratify the lesion. She feels well, no problems. She is doing well with [place]. She has some SOB when walking up hill.   10/23/2013 Chief Complaint  Patient presents with  . Acute Visit    RB pt - started coughing Friday night with mostly clear mucus.  Wheezing last night. No increased SOB and chest tightness/pain.  Started chemo next wk.  Pt started coughing over weekend.  Pt feels like URI?  Pt ate late past weekend, sloppy joes. THen had severe acid reflux in mouth over night.  Then started coughing . Min prod mucus, clear to sl tinge.  Pt notes pndrip. Pt had surgery 08/2013, and needs chemorx.      Objective:   Physical Exam Filed Vitals:   10/23/13 1046  BP: 116/68  Pulse: 101  Temp: 98.3 F (36.8 C)  TempSrc: Oral  Height: 5\' 2"  (1.575 m)  Weight: 130 lb 6.4 oz (59.149 kg)  SpO2: 95%   Gen: Pleasant, well-nourished, in no distress,  normal affect  ENT: No lesions,  mouth clear,  oropharynx clear, no postnasal drip  Neck: No JVD, no TMG, no carotid bruits  Lungs: No use of accessory muscles,   Cardiovascular: RRR, heart sounds normal, no murmur or gallops, no peripheral edema  Musculoskeletal: No deformities, no cyanosis or clubbing  Neuro: alert, non focal  07/28/13 --  COMPARISON: 05/08/2013. 01/31/2013. CTs dating back to 08/05/2011.  FINDINGS:  Again noted are areas of bandlike scarring with associated  bronchiectasis in the medial left upper lobe and superior segment of  the left lower lobe. These areas are stable and compatible with post  treatment changes/scarring.  Small cluster of nodules in the right upper lobe again noted,  slightly improved in overall size and number. The largest nodule is  on image 26  measuring 3.7 mm compared to 5 mm previously. Tiny  clustered nodules more inferiorly in the right upper lobe on image  32, stable. 4 mm nodule in the right lower lobe on image 42, stable.  Small subpleural nodule on image 46 is stable. Minimal density  anteriorly in the right middle lobe and lingula compatible with  scarring.  Within the anterior inferior medial right upper lobe, there is a  somewhat spiculated nodule noted. This measures 8 mm in the same  axis measured previously when this measures 6 mm. This is measured  up on image 30 of today's study. On prior study, there appeared to  be a smaller nodule immediately adjacent/abutting this larger  nodule. On today's study, this appears more confluent into 1 nodule  with the overall largest diameter measuring up to 11 mm. When  measuring the prior study in this same axis, this area measured 10  mm previously. When comparing to studies dating back to 08/05/2011,  this area is slowly enlarging.  No new pulmonary nodules. Mild underlying centrilobular emphysema.  No pleural effusions or pericardial effusion. Insert Heart No  mediastinal, hilar, or axillary adenopathy. Chest wall soft tissues  are unremarkable.  2.3 cm low-density lesion within the right hepatic lobe is stable,  most likely cysts. No acute findings in the upper abdomen.  No acute bony abnormality or focal bone lesion.  IMPRESSION:  Post treatment changes in the medial left upper lobe and superior  segment of the left lower lobe, stable.  Several nodular densities in the right lung, most of which are  stable. There is a slowly enlarging and spiculated appearing nodule  in the anterior inferior right upper lobe, measuring 11 x 8 mm on  today's study compared to 10 x 6 mm previously. Recommend continued  close follow-up of this pulmonary nodule with repeat CT in 3-6  months or further characterization with PET CT.       Assessment & Plan:  COPD (chronic obstructive  pulmonary disease) COPD with acute exacerbation secondary to high level reflux was microaspiration Plan Use hycodan cough syrup 5ML as needed for cough Start omeprazole one daily before breakfast 41minutes then eat for one month then STOP Follow reflux diet STOP fish oil Take flonase two puff ea nostril daily Take claritin daily No antibiotics needed Ok to take chemo Return to Dr byrum on schedule      Review of Systems     Objective:   Physical Exam        Assessment & Plan:

## 2013-10-23 NOTE — Patient Instructions (Signed)
Use hycodan cough syrup 5ML as needed for cough Start omeprazole one daily before breakfast 61minutes then eat for one month then STOP Follow reflux diet STOP fish oil Take flonase two puff ea nostril daily Take claritin daily No antibiotics needed Ok to take chemo Return to Dr byrum on schedule

## 2013-10-23 NOTE — Telephone Encounter (Signed)
Called spoke with pt. appt scheduled to see PW this AM 10:45.  Nothing further needed

## 2013-10-25 ENCOUNTER — Encounter: Payer: Self-pay | Admitting: Pharmacist

## 2013-10-27 ENCOUNTER — Encounter: Payer: Self-pay | Admitting: Adult Health

## 2013-10-27 ENCOUNTER — Ambulatory Visit (INDEPENDENT_AMBULATORY_CARE_PROVIDER_SITE_OTHER): Payer: Medicare Other | Admitting: Adult Health

## 2013-10-27 VITALS — BP 114/76 | HR 89 | Temp 98.1°F | Ht 62.0 in | Wt 129.4 lb

## 2013-10-27 DIAGNOSIS — J441 Chronic obstructive pulmonary disease with (acute) exacerbation: Secondary | ICD-10-CM

## 2013-10-27 MED ORDER — FLUTICASONE FUROATE-VILANTEROL 100-25 MCG/INH IN AEPB: 1.0000 | INHALATION_SPRAY | Freq: Every day | RESPIRATORY_TRACT | Status: DC

## 2013-10-27 MED ORDER — CEFDINIR 300 MG PO CAPS: 300.0000 mg | ORAL_CAPSULE | Freq: Two times a day (BID) | ORAL | Status: DC

## 2013-10-27 NOTE — Patient Instructions (Signed)
Omnicef 300mg  Twice daily  For 7 days  Eat yogurt while on antibiotic Mucinex DM Twice daily  As needed  Cough/congestion  Fluids and rest  Begin BREO 1 puff daily -if you like this , prescription has been sent to your pharmacy .  Follow up Dr. Lamonte Sakai  In 6 weeks and As needed   Please contact office for sooner follow up if symptoms do not improve or worsen or seek emergency care

## 2013-10-27 NOTE — Progress Notes (Signed)
Subjective:    Patient ID: Claudia [name], female    DOB: 06-01-1945, 68 y.o.   MRN: [id number]  HPI 68 yo former smoker with a history of COPD and small cell lung cancer diagnosed by Dr. Arlyce Dice in June 2001 and treated by Dr. Earlie Server and Dr. Tammi Klippel. She was admitted to the hospital in early January 2013 for an exacerbation of COPD vs CAP. Her CT scan of the chest revealed no pulmonary embolism, radiation changes and scarring in the left upper lobe, stable pulmonary nodularity as detailed below, and some left sided loculated pleural fluid. She presents today as new consult. She denies any current breathing difficulty. She gets serial CT scans through Dr Earlie Server. She was weak after discharge, now feels at baseline. She usually walks daily, does yoga. She is able to exert.   ROV 10/28/11 -- follow up for dyspnea and presumed COPD based on an AE that happened in 1/13. Also with small cell lung CA, followed by Dr Julien Nordmann. Next CT scan in November. She denies any dyspnea, has a good exercise tolerance.  At this time she would like to defer PFT and BD's.   ROV 04/14/12 -- presumed COPD, hx SCLCA. Last CTscan was 02/05/12, stable. She has had a dry cough for about a week. Non-productive. She is having clear nasal drainage, bothers her in the winter. She ? Whether she may have had a URI. She has been Nyquil - didn;t take it last night, resulted in congestion, nasal gtt, cough. Some occasional eye pressure, no real HA. Occasional GERD. No new exposures, although it was damp during her last trip [place]. This has happened in January before.   ROV 07/26/12 -- COPD, hx SCLCA (last CT scan 11/13). At last OV she was having trouble with PND and cough - tried [place], loratadine, fluticasone nasal spray. She is using these prn for now. Remains active on no BD's. She is due for a CT scan chest next month, to be followed by Dr Julien Nordmann.   ROV 08/03/13 -- COPD, hx SCLCA, PND and cough. Follows with Dr Julien Nordmann. Her  CT scan 5/1 shows stable nodules with a RUL nodule that looks a bit more lobulated and slightly larger to 76mm. She is going to have a PET scan to risk stratify the lesion. She feels well, no problems. She is doing well with [place]. She has some SOB when walking up hill.   10/23/2013 Chief Complaint  Patient presents with  . Acute Visit    RB pt - started coughing Friday night with mostly clear mucus.  Wheezing last night. No increased SOB and chest tightness/pain.  Started chemo next wk.  Pt started coughing over weekend.  Pt feels like URI?  Pt ate late past weekend, sloppy joes. THen had severe acid reflux in mouth over night.  Then started coughing . Min prod mucus, clear to sl tinge.  Pt notes pndrip. Pt had surgery 08/2013, and needs chemorx.   10/27/2013 Acute OV  HX: COPD, hx SCLCA, Returns for persistent symptoms with cough .  Still not feeling better; continues to cough-productive at times-tinge of yellow/beige, nasal and chest congestion,wheezing at night.  Feels that the meds PW suggested isnt workign well. Worried because she is starting chemo next week.  Started PPI, claritin and flonase.  She denies any hemoptysis, orthopnea, PND, or leg swelling.  Recent PFTs on June 18 showed FEV1 of 1.14 L, 52% predicted, ratio of 55, positive bronchodilator response,  decreased diffusing capacity of 51%     ROS  Constitutional:   No  weight loss, night sweats,  Fevers, chills, fatigue, or  lassitude.  HEENT:   No headaches,  Difficulty swallowing,  Tooth/dental problems, or  Sore throat,                No sneezing, itching, ear ache,  +nasal congestion, post nasal drip,   CV:  No chest pain,  Orthopnea, PND, swelling in lower extremities, anasarca, dizziness, palpitations, syncope.   GI  No heartburn, indigestion, abdominal pain, nausea, vomiting, diarrhea, change in bowel habits, loss of appetite, bloody stools.   Resp:    No chest wall deformity  Skin: no rash or lesions.  GU: no  dysuria, change in color of urine, no urgency or frequency.  No flank pain, no hematuria   MS:  No joint pain or swelling.  No decreased range of motion.  No back pain.  Psych:  No change in mood or affect. No depression or anxiety.  No memory loss.       Objective:   Physical Exam  Gen: Pleasant, well-nourished, in no distress,  normal affect  ENT: No lesions,  mouth clear,  oropharynx clear, no postnasal drip  Neck: No JVD, no TMG, no carotid bruits  Lungs: No use of accessory muscles,   Cardiovascular: RRR, heart sounds normal, no murmur or gallops, no peripheral edema  Musculoskeletal: No deformities, no cyanosis or clubbing  Neuro: alert, non focal

## 2013-10-27 NOTE — Assessment & Plan Note (Signed)
Slow to resolve flare   Plan Omnicef 300mg  Twice daily  For 7 days  Eat yogurt while on antibiotic Mucinex DM Twice daily  As needed  Cough/congestion  Fluids and rest  Begin BREO 1 puff daily -if you like this , prescription has been sent to your pharmacy .  Follow up Dr. Lamonte Sakai  In 6 weeks and As needed   Please contact office for sooner follow up if symptoms do not improve or worsen or seek emergency care

## 2013-10-30 ENCOUNTER — Ambulatory Visit (HOSPITAL_BASED_OUTPATIENT_CLINIC_OR_DEPARTMENT_OTHER): Payer: Medicare Other

## 2013-10-30 ENCOUNTER — Other Ambulatory Visit (HOSPITAL_BASED_OUTPATIENT_CLINIC_OR_DEPARTMENT_OTHER): Payer: Medicare Other

## 2013-10-30 VITALS — BP 125/54 | HR 75 | Temp 97.7°F

## 2013-10-30 DIAGNOSIS — Z5111 Encounter for antineoplastic chemotherapy: Secondary | ICD-10-CM

## 2013-10-30 DIAGNOSIS — C3410 Malignant neoplasm of upper lobe, unspecified bronchus or lung: Secondary | ICD-10-CM

## 2013-10-30 DIAGNOSIS — C3491 Malignant neoplasm of unspecified part of right bronchus or lung: Secondary | ICD-10-CM

## 2013-10-30 LAB — COMPREHENSIVE METABOLIC PANEL (CC13)
ALT: 11 U/L (ref 0–55)
AST: 14 U/L (ref 5–34)
Albumin: 3.5 g/dL (ref 3.5–5.0)
Alkaline Phosphatase: 118 U/L (ref 40–150)
Anion Gap: 9 mEq/L (ref 3–11)
BUN: 10.8 mg/dL (ref 7.0–26.0)
CO2: 25 mEq/L (ref 22–29)
Calcium: 10.2 mg/dL (ref 8.4–10.4)
Chloride: 106 mEq/L (ref 98–109)
Creatinine: 0.8 mg/dL (ref 0.6–1.1)
Glucose: 109 mg/dl (ref 70–140)
Potassium: 4.1 mEq/L (ref 3.5–5.1)
Sodium: 140 mEq/L (ref 136–145)
Total Bilirubin: 0.22 mg/dL (ref 0.20–1.20)
Total Protein: 7.1 g/dL (ref 6.4–8.3)

## 2013-10-30 LAB — CBC WITH DIFFERENTIAL/PLATELET
BASO%: 1.2 % (ref 0.0–2.0)
Basophils Absolute: 0.1 10*3/uL (ref 0.0–0.1)
EOS%: 4.7 % (ref 0.0–7.0)
Eosinophils Absolute: 0.3 10*3/uL (ref 0.0–0.5)
HCT: 35.6 % (ref 34.8–46.6)
HGB: 11.5 g/dL — ABNORMAL LOW (ref 11.6–15.9)
LYMPH%: 9.4 % — ABNORMAL LOW (ref 14.0–49.7)
MCH: 29.2 pg (ref 25.1–34.0)
MCHC: 32.3 g/dL (ref 31.5–36.0)
MCV: 90.3 fL (ref 79.5–101.0)
MONO#: 0.9 10*3/uL (ref 0.1–0.9)
MONO%: 12.9 % (ref 0.0–14.0)
NEUT#: 5.1 10*3/uL (ref 1.5–6.5)
NEUT%: 71.8 % (ref 38.4–76.8)
Platelets: 227 10*3/uL (ref 145–400)
RBC: 3.95 10*6/uL (ref 3.70–5.45)
RDW: 14 % (ref 11.2–14.5)
WBC: 7 10*3/uL (ref 3.9–10.3)
lymph#: 0.7 10*3/uL — ABNORMAL LOW (ref 0.9–3.3)

## 2013-10-30 MED ORDER — SODIUM CHLORIDE 0.9 % IV SOLN
120.0000 mg/m2 | Freq: Once | INTRAVENOUS | Status: AC
  Administered 2013-10-30: 190 mg via INTRAVENOUS
  Filled 2013-10-30: qty 9.5

## 2013-10-30 MED ORDER — SODIUM CHLORIDE 0.9 % IV SOLN
Freq: Once | INTRAVENOUS | Status: AC
  Administered 2013-10-30: 10:00:00 via INTRAVENOUS

## 2013-10-30 MED ORDER — SODIUM CHLORIDE 0.9 % IV SOLN
380.5000 mg | Freq: Once | INTRAVENOUS | Status: AC
  Administered 2013-10-30: 380 mg via INTRAVENOUS
  Filled 2013-10-30: qty 38

## 2013-10-30 MED ORDER — DEXAMETHASONE SODIUM PHOSPHATE 20 MG/5ML IJ SOLN
INTRAMUSCULAR | Status: AC
  Filled 2013-10-30: qty 5

## 2013-10-30 MED ORDER — ONDANSETRON 16 MG/50ML IVPB (CHCC)
16.0000 mg | Freq: Once | INTRAVENOUS | Status: AC
  Administered 2013-10-30: 16 mg via INTRAVENOUS

## 2013-10-30 MED ORDER — ONDANSETRON 16 MG/50ML IVPB (CHCC)
INTRAVENOUS | Status: AC
  Filled 2013-10-30: qty 16

## 2013-10-30 MED ORDER — DEXAMETHASONE SODIUM PHOSPHATE 20 MG/5ML IJ SOLN
20.0000 mg | Freq: Once | INTRAMUSCULAR | Status: AC
  Administered 2013-10-30: 20 mg via INTRAVENOUS

## 2013-10-30 NOTE — Patient Instructions (Signed)
[name] Discharge Instructions for Patients Receiving Chemotherapy  Today you received the following chemotherapy agents: Carboplatin and Etoposide.  To help prevent nausea and vomiting after your treatment, we encourage you to take your nausea medication: Compazine  Every 6 hours as needed.   If you develop nausea and vomiting that is not controlled by your nausea medication, call the clinic.   BELOW ARE SYMPTOMS THAT SHOULD BE REPORTED IMMEDIATELY:  *FEVER GREATER THAN 100.5 F  *CHILLS WITH OR WITHOUT FEVER  NAUSEA AND VOMITING THAT IS NOT CONTROLLED WITH YOUR NAUSEA MEDICATION  *UNUSUAL SHORTNESS OF BREATH  *UNUSUAL BRUISING OR BLEEDING  TENDERNESS IN MOUTH AND THROAT WITH OR WITHOUT PRESENCE OF ULCERS  *URINARY PROBLEMS  *BOWEL PROBLEMS  UNUSUAL RASH Items with * indicate a potential emergency and should be followed up as soon as possible.  Feel free to call the clinic you have any questions or concerns. The clinic phone number is [phone].

## 2013-10-31 ENCOUNTER — Ambulatory Visit (HOSPITAL_BASED_OUTPATIENT_CLINIC_OR_DEPARTMENT_OTHER): Payer: Medicare Other

## 2013-10-31 ENCOUNTER — Other Ambulatory Visit: Payer: Self-pay

## 2013-10-31 VITALS — BP 122/56 | HR 76 | Temp 97.8°F | Resp 18

## 2013-10-31 DIAGNOSIS — Z5111 Encounter for antineoplastic chemotherapy: Secondary | ICD-10-CM

## 2013-10-31 DIAGNOSIS — C3491 Malignant neoplasm of unspecified part of right bronchus or lung: Secondary | ICD-10-CM

## 2013-10-31 DIAGNOSIS — C3410 Malignant neoplasm of upper lobe, unspecified bronchus or lung: Secondary | ICD-10-CM

## 2013-10-31 MED ORDER — DEXAMETHASONE SODIUM PHOSPHATE 10 MG/ML IJ SOLN
INTRAMUSCULAR | Status: AC
  Filled 2013-10-31: qty 1

## 2013-10-31 MED ORDER — SODIUM CHLORIDE 0.9 % IV SOLN
Freq: Once | INTRAVENOUS | Status: AC
  Administered 2013-10-31: 10:00:00 via INTRAVENOUS

## 2013-10-31 MED ORDER — ONDANSETRON 8 MG/50ML IVPB (CHCC)
8.0000 mg | Freq: Once | INTRAVENOUS | Status: AC
  Administered 2013-10-31: 8 mg via INTRAVENOUS

## 2013-10-31 MED ORDER — SODIUM CHLORIDE 0.9 % IV SOLN
120.0000 mg/m2 | Freq: Once | INTRAVENOUS | Status: AC
  Administered 2013-10-31: 190 mg via INTRAVENOUS
  Filled 2013-10-31: qty 9.5

## 2013-10-31 MED ORDER — DEXAMETHASONE SODIUM PHOSPHATE 10 MG/ML IJ SOLN
10.0000 mg | Freq: Once | INTRAMUSCULAR | Status: AC
  Administered 2013-10-31: 10 mg via INTRAVENOUS

## 2013-10-31 MED ORDER — ONDANSETRON 8 MG/NS 50 ML IVPB
INTRAVENOUS | Status: AC
  Filled 2013-10-31: qty 8

## 2013-10-31 NOTE — Patient Instructions (Signed)
[facility]aud[facility] [name] Discharge Instructions for Patients Receiving Chemotherapy  Today you received the following chemotherapy agents Etoposide.  To help prevent nausea and vomiting after your treatment, we encourage you to take your nausea medication.   If you develop nausea and vomiting that is not controlled by your nausea medication, call the clinic.   BELOW ARE SYMPTOMS THAT SHOULD BE REPORTED IMMEDIATELY:  *FEVER GREATER THAN 100.5 F  *CHILLS WITH OR WITHOUT FEVER  NAUSEA AND VOMITING THAT IS NOT CONTROLLED WITH YOUR NAUSEA MEDICATION  *UNUSUAL SHORTNESS OF BREATH  *UNUSUAL BRUISING OR BLEEDING  TENDERNESS IN MOUTH AND THROAT WITH OR WITHOUT PRESENCE OF ULCERS  *URINARY PROBLEMS  *BOWEL PROBLEMS  UNUSUAL RASH Items with * indicate a potential emergency and should be followed up as soon as possible.  Feel free to call the clinic you have any questions or concerns. The clinic phone number is [phone].

## 2013-11-01 ENCOUNTER — Ambulatory Visit (HOSPITAL_BASED_OUTPATIENT_CLINIC_OR_DEPARTMENT_OTHER): Payer: Medicare Other

## 2013-11-01 VITALS — BP 124/66 | HR 76 | Temp 97.9°F | Resp 18

## 2013-11-01 DIAGNOSIS — C3410 Malignant neoplasm of upper lobe, unspecified bronchus or lung: Secondary | ICD-10-CM

## 2013-11-01 DIAGNOSIS — Z5111 Encounter for antineoplastic chemotherapy: Secondary | ICD-10-CM

## 2013-11-01 DIAGNOSIS — C3491 Malignant neoplasm of unspecified part of right bronchus or lung: Secondary | ICD-10-CM

## 2013-11-01 MED ORDER — DEXAMETHASONE SODIUM PHOSPHATE 10 MG/ML IJ SOLN
INTRAMUSCULAR | Status: AC
  Filled 2013-11-01: qty 1

## 2013-11-01 MED ORDER — DEXAMETHASONE SODIUM PHOSPHATE 10 MG/ML IJ SOLN
10.0000 mg | Freq: Once | INTRAMUSCULAR | Status: AC
  Administered 2013-11-01: 10 mg via INTRAVENOUS

## 2013-11-01 MED ORDER — SODIUM CHLORIDE 0.9 % IV SOLN
Freq: Once | INTRAVENOUS | Status: AC
  Administered 2013-11-01: 10:00:00 via INTRAVENOUS

## 2013-11-01 MED ORDER — ONDANSETRON 8 MG/50ML IVPB (CHCC)
8.0000 mg | Freq: Once | INTRAVENOUS | Status: AC
  Administered 2013-11-01: 8 mg via INTRAVENOUS

## 2013-11-01 MED ORDER — ONDANSETRON 8 MG/NS 50 ML IVPB
INTRAVENOUS | Status: AC
  Filled 2013-11-01: qty 8

## 2013-11-01 MED ORDER — SODIUM CHLORIDE 0.9 % IV SOLN
120.0000 mg/m2 | Freq: Once | INTRAVENOUS | Status: AC
  Administered 2013-11-01: 190 mg via INTRAVENOUS
  Filled 2013-11-01: qty 9.5

## 2013-11-01 MED ORDER — HEPARIN SOD (PORK) LOCK FLUSH 100 UNIT/ML IV SOLN
500.0000 [IU] | Freq: Once | INTRAVENOUS | Status: DC | PRN
  Filled 2013-11-01: qty 5

## 2013-11-01 MED ORDER — SODIUM CHLORIDE 0.9 % IJ SOLN
10.0000 mL | INTRAMUSCULAR | Status: DC | PRN
  Filled 2013-11-01: qty 10

## 2013-11-01 NOTE — Patient Instructions (Signed)
[facility] Discharge Instructions for Patients Receiving Chemotherapy  Today you received the following chemotherapy agents: Etoposide  To help prevent nausea and vomiting after your treatment, we encourage you to take your nausea medication as prescribed.    If you develop nausea and vomiting that is not controlled by your nausea medication, call the clinic.   BELOW ARE SYMPTOMS THAT SHOULD BE REPORTED IMMEDIATELY:  *FEVER GREATER THAN 100.5 F  *CHILLS WITH OR WITHOUT FEVER  NAUSEA AND VOMITING THAT IS NOT CONTROLLED WITH YOUR NAUSEA MEDICATION  *UNUSUAL SHORTNESS OF BREATH  *UNUSUAL BRUISING OR BLEEDING  TENDERNESS IN MOUTH AND THROAT WITH OR WITHOUT PRESENCE OF ULCERS  *URINARY PROBLEMS  *BOWEL PROBLEMS  UNUSUAL RASH Items with * indicate a potential emergency and should be followed up as soon as possible.  Feel free to call the clinic you have any questions or concerns. The clinic phone number is [phone].

## 2013-11-02 ENCOUNTER — Telehealth: Payer: Self-pay | Admitting: *Deleted

## 2013-11-02 ENCOUNTER — Ambulatory Visit (HOSPITAL_BASED_OUTPATIENT_CLINIC_OR_DEPARTMENT_OTHER): Payer: Medicare Other

## 2013-11-02 VITALS — BP 130/73 | HR 72 | Temp 97.8°F

## 2013-11-02 DIAGNOSIS — C3491 Malignant neoplasm of unspecified part of right bronchus or lung: Secondary | ICD-10-CM

## 2013-11-02 DIAGNOSIS — C3410 Malignant neoplasm of upper lobe, unspecified bronchus or lung: Secondary | ICD-10-CM

## 2013-11-02 DIAGNOSIS — Z5189 Encounter for other specified aftercare: Secondary | ICD-10-CM

## 2013-11-02 MED ORDER — PEGFILGRASTIM INJECTION 6 MG/0.6ML
6.0000 mg | Freq: Once | SUBCUTANEOUS | Status: AC
  Administered 2013-11-02: 6 mg via SUBCUTANEOUS
  Filled 2013-11-02: qty 0.6

## 2013-11-02 NOTE — Patient Instructions (Signed)
Pegfilgrastim injection What is this medicine? PEGFILGRASTIM (peg fil GRA stim) is a long-acting granulocyte colony-stimulating factor that stimulates the growth of neutrophils, a type of white blood cell important in the body's fight against infection. It is used to reduce the incidence of fever and infection in patients with certain types of cancer who are receiving chemotherapy that affects the bone marrow. This medicine may be used for other purposes; ask your health care provider or pharmacist if you have questions. COMMON BRAND NAME(S): Neulasta What should I tell my health care provider before I take this medicine? They need to know if you have any of these conditions: -latex allergy -ongoing radiation therapy -sickle cell disease -skin reactions to acrylic adhesives (On-Body Injector only) -an unusual or allergic reaction to pegfilgrastim, filgrastim, other medicines, foods, dyes, or preservatives -pregnant or trying to get pregnant -breast-feeding How should I use this medicine? This medicine is for injection under the skin. If you get this medicine at home, you will be taught how to prepare and give the pre-filled syringe or how to use the On-body Injector. Refer to the patient Instructions for Use for detailed instructions. Use exactly as directed. Take your medicine at regular intervals. Do not take your medicine more often than directed. It is important that you put your used needles and syringes in a special sharps container. Do not put them in a trash can. If you do not have a sharps container, call your pharmacist or healthcare provider to get one. Talk to your pediatrician regarding the use of this medicine in children. Special care may be needed. Overdosage: If you think you have taken too much of this medicine contact a poison control center or emergency room at once. NOTE: This medicine is only for you. Do not share this medicine with others. What if I miss a dose? It is  important not to miss your dose. Call your doctor or health care professional if you miss your dose. If you miss a dose due to an On-body Injector failure or leakage, a new dose should be administered as soon as possible using a single prefilled syringe for manual use. What may interact with this medicine? Interactions have not been studied. Give your health care provider a list of all the medicines, herbs, non-prescription drugs, or dietary supplements you use. Also tell them if you smoke, drink alcohol, or use illegal drugs. Some items may interact with your medicine. This list may not describe all possible interactions. Give your health care provider a list of all the medicines, herbs, non-prescription drugs, or dietary supplements you use. Also tell them if you smoke, drink alcohol, or use illegal drugs. Some items may interact with your medicine. What should I watch for while using this medicine? You may need blood work done while you are taking this medicine. If you are going to need a MRI, CT scan, or other procedure, tell your doctor that you are using this medicine (On-Body Injector only). What side effects may I notice from receiving this medicine? Side effects that you should report to your doctor or health care professional as soon as possible: -allergic reactions like skin rash, itching or hives, swelling of the face, lips, or tongue -dizziness -fever -pain, redness, or irritation at site where injected -pinpoint red spots on the skin -shortness of breath or breathing problems -stomach or side pain, or pain at the shoulder -swelling -tiredness -trouble passing urine Side effects that usually do not require medical attention (report to your doctor   or health care professional if they continue or are bothersome): -bone pain -muscle pain This list may not describe all possible side effects. Call your doctor for medical advice about side effects. You may report side effects to FDA at  1-800-FDA-1088. Where should I keep my medicine? Keep out of the reach of children. Store pre-filled syringes in a refrigerator between 2 and 8 degrees C (36 and 46 degrees F). Do not freeze. Keep in carton to protect from light. Throw away this medicine if it is left out of the refrigerator for more than 48 hours. Throw away any unused medicine after the expiration date. NOTE: This sheet is a summary. It may not cover all possible information. If you have questions about this medicine, talk to your doctor, pharmacist, or health care provider.  2015, [employer]. (2013-06-15 16:14:05)  

## 2013-11-02 NOTE — Telephone Encounter (Signed)
Claudia [name] here for Neulasta injection following 1st carbo/vp chemo treatment.  States that she is doing well, so far.  No nausea, vomiting or diarrhea.  Is drinking lots of fluids.  All questions answered.  Knows to call if she has any problems or concerns

## 2013-11-06 ENCOUNTER — Encounter: Payer: Self-pay | Admitting: Physician Assistant

## 2013-11-06 ENCOUNTER — Other Ambulatory Visit (HOSPITAL_BASED_OUTPATIENT_CLINIC_OR_DEPARTMENT_OTHER): Payer: Medicare Other

## 2013-11-06 ENCOUNTER — Telehealth: Payer: Self-pay | Admitting: Physician Assistant

## 2013-11-06 ENCOUNTER — Ambulatory Visit (HOSPITAL_BASED_OUTPATIENT_CLINIC_OR_DEPARTMENT_OTHER): Payer: Medicare Other | Admitting: Physician Assistant

## 2013-11-06 VITALS — BP 145/94 | HR 85 | Temp 98.5°F | Resp 18 | Ht 62.0 in | Wt 130.7 lb

## 2013-11-06 DIAGNOSIS — C3491 Malignant neoplasm of unspecified part of right bronchus or lung: Secondary | ICD-10-CM

## 2013-11-06 DIAGNOSIS — C3410 Malignant neoplasm of upper lobe, unspecified bronchus or lung: Secondary | ICD-10-CM | POA: Diagnosis not present

## 2013-11-06 DIAGNOSIS — C3490 Malignant neoplasm of unspecified part of unspecified bronchus or lung: Secondary | ICD-10-CM

## 2013-11-06 LAB — CBC WITH DIFFERENTIAL/PLATELET
BASO%: 1.7 % (ref 0.0–2.0)
Basophils Absolute: 0 10*3/uL (ref 0.0–0.1)
EOS%: 5.9 % (ref 0.0–7.0)
Eosinophils Absolute: 0.1 10*3/uL (ref 0.0–0.5)
HCT: 31.3 % — ABNORMAL LOW (ref 34.8–46.6)
HGB: 10.1 g/dL — ABNORMAL LOW (ref 11.6–15.9)
LYMPH%: 22.4 % (ref 14.0–49.7)
MCH: 29.4 pg (ref 25.1–34.0)
MCHC: 32.4 g/dL (ref 31.5–36.0)
MCV: 90.9 fL (ref 79.5–101.0)
MONO#: 0.1 10*3/uL (ref 0.1–0.9)
MONO%: 4.4 % (ref 0.0–14.0)
NEUT#: 1 10*3/uL — ABNORMAL LOW (ref 1.5–6.5)
NEUT%: 65.6 % (ref 38.4–76.8)
Platelets: 88 10*3/uL — ABNORMAL LOW (ref 145–400)
RBC: 3.45 10*6/uL — ABNORMAL LOW (ref 3.70–5.45)
RDW: 13.9 % (ref 11.2–14.5)
WBC: 1.6 10*3/uL — ABNORMAL LOW (ref 3.9–10.3)
lymph#: 0.4 10*3/uL — ABNORMAL LOW (ref 0.9–3.3)

## 2013-11-06 LAB — COMPREHENSIVE METABOLIC PANEL
ALT: 11 U/L (ref 0–35)
AST: 13 U/L (ref 0–37)
Albumin: 3.9 g/dL (ref 3.5–5.2)
Alkaline Phosphatase: 112 U/L (ref 39–117)
BUN: 15 mg/dL (ref 6–23)
CO2: 27 mEq/L (ref 19–32)
Calcium: 10.4 mg/dL (ref 8.4–10.5)
Chloride: 102 mEq/L (ref 96–112)
Creatinine, Ser: 0.76 mg/dL (ref 0.50–1.10)
Glucose, Bld: 82 mg/dL (ref 70–99)
Potassium: 4.2 mEq/L (ref 3.5–5.3)
Sodium: 137 mEq/L (ref 135–145)
Total Bilirubin: 1.3 mg/dL — ABNORMAL HIGH (ref 0.2–1.2)
Total Protein: 6.6 g/dL (ref 6.0–8.3)

## 2013-11-06 MED ORDER — HYDROCODONE-HOMATROPINE 5-1.5 MG/5ML PO SYRP: 5.0000 mL | ORAL_SOLUTION | Freq: Four times a day (QID) | ORAL | Status: DC | PRN

## 2013-11-06 NOTE — Progress Notes (Signed)
[place] [phone]   Fax:([phone]) [place], [place] [phone]  PRINCIPAL DIAGNOSIS: recurrent small cell lung cancer initially diagnosed as Limited stage small cell lung cancer in June 2011.   PRIOR THERAPY:  1. Status post 4 cycles of systemic chemotherapy with carboplatin and etoposide. Last dose was given 11/14/2009. This was concurrent with radiotherapy. 2. Status post prophylactic cranial irradiation completed January 28, 2010. 3. Status post wedge resection of the right upper lobe lung nodule under the care of Dr. Cyndia Bent on 09/21/2013 and the final pathology was consistent with small cell lung cancer.  CURRENT THERAPY: systemic chemotherapy with carboplatin for AUC of 5 on day 1 and etoposide 120 mg/M2 on days 1, 2 and 3 with Neulasta support on day 4 every 3 weeks. First dose on 10/30/2013. Status post 1 cycle  INTERVAL HISTORY: Claudia [name] 68 y.o. female returns to the clinic today for  A symptom management  visit accompanied by her husband. She is currently being treated with systemic chemotherapy with carboplatin and etoposide with neulasta support for her recurrent small cel lung cancer. She is status post 1 cycle.  Overall she tolerated the first cycle of chemotherapy without difficulty. She has not experienced any nausea or vomiting yet. She denied fever, chills, diarrhea or constipations. She does report continued cough productive of clear secretions. She requests a refill for her hycodan cough syrup.She denied having any significant shortness of breath or hemoptysis. She denied having any significant weight loss or night sweats.  MEDICAL HISTORY: Past Medical History  Diagnosis Date  . COPD (chronic obstructive pulmonary disease)   . Anemia   . Bradycardia     mild,may be due to beta blocker therapy  . Lymphocytic colitis   . Lung cancer 2010    Dr. Julien Nordmann,  finished chemo, sp radiation, left upper   . GERD (gastroesophageal reflux disease)     otc  . Arthritis   . Pneumonia     ALLERGIES:  is allergic to azithromycin and tussionex pennkinetic er.  MEDICATIONS:  Current Outpatient Prescriptions  Medication Sig Dispense Refill  . aspirin EC 81 MG tablet Take 81 mg by mouth. Couple times a week      . fluticasone (FLONASE) 50 MCG/ACT nasal spray Place 2 sprays into both nostrils daily.  16 g  4  . Fluticasone Furoate-Vilanterol (BREO ELLIPTA) 100-25 MCG/INH AEPB Inhale 1 puff into the lungs daily.  1 each  5  . Probiotic Product (PROBIOTIC DAILY PO) Take 1 tablet by mouth daily.       . cefdinir (OMNICEF) 300 MG capsule Take 1 capsule (300 mg total) by mouth 2 (two) times daily.  14 capsule  0  . HYDROcodone-homatropine (HYCODAN) 5-1.5 MG/5ML syrup Take 5 mLs by mouth every 6 (six) hours as needed for cough.  120 mL  0  . HYDROcodone-homatropine (HYCODAN) 5-1.5 MG/5ML syrup Take 5 mLs by mouth every 6 (six) hours as needed for cough.  120 mL  0  . loratadine (CLARITIN) 10 MG tablet Take 1 tablet (10 mg total) by mouth daily.      Marland Kitchen omeprazole (PRILOSEC) 20 MG capsule Take 1 capsule (20 mg total) by mouth daily.  30 capsule  11  . prochlorperazine (COMPAZINE) 10 MG tablet Take 1 tablet (10 mg total) by mouth every 6 (six) hours as needed for nausea or vomiting.  30 tablet  0   No current facility-administered medications for this visit.    SURGICAL HISTORY:  Past Surgical History  Procedure Laterality Date  . Neck surgery  1980's  . Dilation and curettage of uterus    . Uterine fibroid surgery  2012  . Bronchoscopy  2011  . Thoracotomy Right 09/21/2013    Procedure: THORACOTOMY MAJOR;  Surgeon: Gaye Pollack, MD;  Location: [facility] OR;  Service: Thoracic;  Laterality: Right;  . Wedge resection Right 09/21/2013    Procedure: RIGHT UPPER LOBE WEDGE RESECTION;  Surgeon: Gaye Pollack, MD;  Location: [facility] OR;  Service: Thoracic;  Laterality: Right;     REVIEW OF SYSTEMS:  Constitutional: negative Eyes: negative Ears, nose, mouth, throat, and face: negative Respiratory: positive for cough Cardiovascular: negative Gastrointestinal: negative Genitourinary:negative Integument/breast: negative Hematologic/lymphatic: negative Musculoskeletal:negative Neurological: negative Behavioral/Psych: negative Endocrine: negative Allergic/Immunologic: negative   PHYSICAL EXAMINATION: General appearance: alert, cooperative and no distress Head: Normocephalic, without obvious abnormality, atraumatic Neck: no adenopathy Lymph nodes: Cervical, supraclavicular, and axillary nodes normal. Resp: clear to auscultation bilaterally Back: symmetric, no curvature. ROM normal. No CVA tenderness. Cardio: regular rate and rhythm, S1, S2 normal, no murmur, click, rub or gallop GI: soft, non-tender; bowel sounds normal; no masses,  no organomegaly Extremities: extremities normal, atraumatic, no cyanosis or edema Neurologic: Alert and oriented X 3, normal strength and tone. Normal symmetric reflexes. Normal coordination and gait  ECOG PERFORMANCE STATUS: 0 - Asymptomatic  Blood pressure 145/94, pulse 85, temperature 98.5 F (36.9 C), temperature source Oral, resp. rate 18, height 5\' 2"  (1.575 m), weight 130 lb 11.2 oz (59.285 kg), SpO2 100.00%.  LABORATORY DATA: Lab Results  Component Value Date   WBC 1.6* 11/06/2013   HGB 10.1* 11/06/2013   HCT 31.3* 11/06/2013   MCV 90.9 11/06/2013   PLT 88* 11/06/2013      Chemistry      Component Value Date/Time   NA 137 11/06/2013 0906   NA 140 10/30/2013 0932   NA 140 08/05/2011 0823   K 4.2 11/06/2013 0906   K 4.1 10/30/2013 0932   K 4.2 08/05/2011 0823   CL 102 11/06/2013 0906   CL 103 08/03/2012 0842   CL 98 08/05/2011 0823   CO2 27 11/06/2013 0906   CO2 25 10/30/2013 0932   CO2 30 08/05/2011 0823   BUN 15 11/06/2013 0906   BUN 10.8 10/30/2013 0932   BUN 13 08/05/2011 0823   CREATININE 0.76 11/06/2013 0906   CREATININE  0.8 10/30/2013 0932   CREATININE 1.0 08/05/2011 0823      Component Value Date/Time   CALCIUM 10.4 11/06/2013 0906   CALCIUM 10.2 10/30/2013 0932   CALCIUM 9.4 08/05/2011 0823   ALKPHOS 112 11/06/2013 0906   ALKPHOS 118 10/30/2013 0932   ALKPHOS 69 08/05/2011 0823   AST 13 11/06/2013 0906   AST 14 10/30/2013 0932   AST 19 08/05/2011 0823   ALT 11 11/06/2013 0906   ALT 11 10/30/2013 0932   ALT 16 08/05/2011 0823   BILITOT 1.3* 11/06/2013 0906   BILITOT 0.22 10/30/2013 0932   BILITOT 0.70 08/05/2011 0823       RADIOGRAPHIC STUDIES:  Dg Chest 2 View  10/11/2013   CLINICAL DATA:  Lung cancer.  EXAM: CHEST  2 VIEW  COMPARISON:  Chest radiograph of September 24, 2013.  FINDINGS: There is stable masslike opacity at the left hilum with scarring and retraction. Deviation of the trachea to the left is again noted and unchanged. Right internal  jugular catheter line noted on prior exam has been removed. No pneumothorax is noted. Mild right pleural effusion is noted which is unchanged. Pleural thickening is seen along the inferior portion the right lateral chest wall. This also is unchanged.  IMPRESSION: Stable left hilar prominence and scarring is noted compared to prior exam. Stable mild right pleural effusion is noted with associated pleural thickening. No pneumothorax is seen.   Electronically Signed   By: Sabino Dick M.D.   On: 10/11/2013 10:27   Dg Chest 2 View  09/24/2013   CLINICAL DATA:  R VATS  EXAM: CHEST - 2 VIEW  COMPARISON:  the previous day's study  FINDINGS: Stable right IJ central line to the mid SVC. Small right apical pneumothorax, pleural line projecting at the level of the posterior margin third rib. Stable right lateral subcutaneous emphysema. The right chest tube has been removed. Small right pleural effusion persists. Masslike opacity at the left hilum with parenchymal distortion and retraction as before. Heart size remains normal.  Visualized skeletal structures are unremarkable.  IMPRESSION: 1. Right chest  tube removal with stable apical pneumothorax and small right effusion.   Electronically Signed   By: Arne Cleveland M.D.   On: 09/24/2013 07:19   Mr Jeri Cos WJ Contrast  10/13/2013   CLINICAL DATA:  Small-cell lung cancer.  Restaging.  EXAM: MRI HEAD WITHOUT AND WITH CONTRAST  TECHNIQUE: Multiplanar, multiecho pulse sequences of the brain and surrounding structures were obtained without and with intravenous contrast.  CONTRAST:  74mL MULTIHANCE GADOBENATE DIMEGLUMINE 529 MG/ML IV SOLN  COMPARISON:  09/08/2010.  FINDINGS: No acute or subacute infarction. The brain shows scattered foci of T2 and FLAIR signal within the hemispheric white matter consistent with minimal chronic small vessel disease. No cortical or large vessel infarction. No primary or metastatic mass lesion. No abnormal enhancement. No hemorrhage, hydrocephalus or extra-axial collection. No skull or skullbase lesion. No pituitary mass. No sinus disease.  IMPRESSION: No metastatic disease.  Minimal chronic small vessel change of the hemispheric white matter.   Electronically Signed   By: Nelson Chimes M.D.   On: 10/13/2013 19:34   Dg Chest Port 1 View  09/23/2013   CLINICAL DATA:  History of lung cancer status post wedge resection.  EXAM: PORTABLE CHEST - 1 VIEW  COMPARISON:  Chest x-ray 09/22/2013.  FINDINGS: Right internal jugular central venous catheter with tip terminating in the mid superior vena cava. Right-sided chest tube remains in position with tip in the lateral aspect of the apex of the right hemithorax. Small right apical pneumothorax occupying approximately 5% of the volume of the right hemithorax is now noted. Masslike opacity perihilar region and medial aspect of the left upper lobe related to prior radiation therapy, similar to recent prior studies. No acute consolidative airspace disease. Mild blunting of the right costophrenic sulcus may suggest a trace amount of right-sided pleural fluid is well. No evidence of pulmonary edema.  Heart size is normal. Mediastinal contours remain distorted. Subcutaneous emphysema in the right chest wall appears slightly decreased.  IMPRESSION: 1. Support apparatus, as above. 2. Interval reaccumulation of a small right pneumothorax in the right apex (less than 5% of the volume of the right hemithorax). There may also be a trace amount of right-sided pleural fluid (i.e., this may represent a hydropneumothorax). 3. Otherwise, the radiographic appearance the chest is largely unchanged, as above. These results will be called to the ordering clinician or representative by the Radiologist Assistant, and communication documented in  the PACS or zVision Dashboard.   Electronically Signed   By: Vinnie Langton M.D.   On: 09/23/2013 10:24   Dg Chest Port 1 View  09/22/2013   CLINICAL DATA:  Post vats  EXAM: PORTABLE CHEST - 1 VIEW  COMPARISON:  09/21/2013  FINDINGS: Right chest tube remains in place with no pneumothorax. Right central line tip is in the SVC, unchanged. Heart is normal size. Subcutaneous air within the right chest wall is stable. Stable fullness of the left hilum with post radiation changes. This is unchanged. No visible effusions. No acute bony abnormality.  IMPRESSION: Stable exam.  No pneumothorax.   Electronically Signed   By: Rolm Baptise M.D.   On: 09/22/2013 08:09   Dg Chest Port 1 View  09/21/2013   CLINICAL DATA:  Postop  EXAM: PORTABLE CHEST - 1 VIEW  COMPARISON:  08/30/2013  FINDINGS: Cardiomediastinal silhouette is stable. Right IJ Port-A-Cath in place. Persistent left hilar prominence and post radiation changes left perihilar region. There is a right chest tube in place. No pneumothorax. Subcutaneous emphysema noted right chest wall.  IMPRESSION: Right chest tube in place. No pneumothorax. Persistent left hilar prominence and postradiation changes in left upper lobe   Electronically Signed   By: Lahoma Crocker M.D.   On: 09/21/2013 14:59   ASSESSMENT AND PLAN: This is a very pleasant 68  years old white female with limited stage small cell lung cancer diagnosed in June of 2011 status post 4 cycles of systemic chemotherapy with carboplatin and etoposide concurrent with radiation followed by prophylactic cranial irradiation and has been observation since November 2007. She recently underwent right upper lobe wedge resection of the pulmonary nodules and the final pathology was consistent with recurrent small cell lung cancer. She is currently being treated with  carboplatin for AUC of 5 on day 1 and etoposide 120 mg/M2 on days 1, 2 and 3 with Neulasta support on day 4. Status post 1 cycle. Overall she tolerated the chemotherapy without difficulty. Her ANC is 1.0, she is afebrile. Neutropenic precautions were reviewed with the patient and she voiced understanding. For her cough I have given her a refill prescription for her Hycodan cough syrup. She may use Delsym cough syrup during the day as well as continue taking Claritin.She will continue with weekly labs as scheduled. She will follow up in 2 weeks prior to the start of cycle #2.   She was advised to call immediately if she has any concerning symptoms in the interval  All questions were answered. The patient knows to call the clinic with any problems, questions or concerns. We can certainly see the patient much sooner if necessary.  Carlton Adam, PA-C   Disclaimer: This note was dictated with voice recognition software. Similar sounding words can inadvertently be transcribed and may not be corrected upon review.

## 2013-11-06 NOTE — Telephone Encounter (Signed)
, °

## 2013-11-12 NOTE — Patient Instructions (Signed)
Continue weekly labs as scheduled Follow neutropenic precautions- call our office or seek evaluation if you develop fever. Follow up in 2 weeks

## 2013-11-13 ENCOUNTER — Other Ambulatory Visit (HOSPITAL_BASED_OUTPATIENT_CLINIC_OR_DEPARTMENT_OTHER): Payer: Medicare Other

## 2013-11-13 DIAGNOSIS — C3410 Malignant neoplasm of upper lobe, unspecified bronchus or lung: Secondary | ICD-10-CM

## 2013-11-13 DIAGNOSIS — C3491 Malignant neoplasm of unspecified part of right bronchus or lung: Secondary | ICD-10-CM

## 2013-11-13 LAB — CBC WITH DIFFERENTIAL/PLATELET
BASO%: 0.4 % (ref 0.0–2.0)
Basophils Absolute: 0 10*3/uL (ref 0.0–0.1)
EOS%: 1.5 % (ref 0.0–7.0)
Eosinophils Absolute: 0.1 10*3/uL (ref 0.0–0.5)
HCT: 26.7 % — ABNORMAL LOW (ref 34.8–46.6)
HGB: 8.7 g/dL — ABNORMAL LOW (ref 11.6–15.9)
LYMPH%: 12.5 % — ABNORMAL LOW (ref 14.0–49.7)
MCH: 29.2 pg (ref 25.1–34.0)
MCHC: 32.6 g/dL (ref 31.5–36.0)
MCV: 89.5 fL (ref 79.5–101.0)
MONO#: 0.7 10*3/uL (ref 0.1–0.9)
MONO%: 11.8 % (ref 0.0–14.0)
NEUT#: 4.2 10*3/uL (ref 1.5–6.5)
NEUT%: 73.8 % (ref 38.4–76.8)
Platelets: 19 10*3/uL — ABNORMAL LOW (ref 145–400)
RBC: 2.98 10*6/uL — ABNORMAL LOW (ref 3.70–5.45)
RDW: 13.8 % (ref 11.2–14.5)
WBC: 5.7 10*3/uL (ref 3.9–10.3)
lymph#: 0.7 10*3/uL — ABNORMAL LOW (ref 0.9–3.3)

## 2013-11-13 LAB — COMPREHENSIVE METABOLIC PANEL (CC13)
ALT: 8 U/L (ref 0–55)
AST: 14 U/L (ref 5–34)
Albumin: 3.5 g/dL (ref 3.5–5.0)
Alkaline Phosphatase: 110 U/L (ref 40–150)
Anion Gap: 9 mEq/L (ref 3–11)
BUN: 8.9 mg/dL (ref 7.0–26.0)
CO2: 28 mEq/L (ref 22–29)
Calcium: 10 mg/dL (ref 8.4–10.4)
Chloride: 104 mEq/L (ref 98–109)
Creatinine: 0.8 mg/dL (ref 0.6–1.1)
Glucose: 92 mg/dl (ref 70–140)
Potassium: 3.9 mEq/L (ref 3.5–5.1)
Sodium: 140 mEq/L (ref 136–145)
Total Bilirubin: 0.2 mg/dL (ref 0.20–1.20)
Total Protein: 6.8 g/dL (ref 6.4–8.3)

## 2013-11-20 ENCOUNTER — Other Ambulatory Visit (HOSPITAL_BASED_OUTPATIENT_CLINIC_OR_DEPARTMENT_OTHER): Payer: Medicare Other

## 2013-11-20 ENCOUNTER — Ambulatory Visit (HOSPITAL_BASED_OUTPATIENT_CLINIC_OR_DEPARTMENT_OTHER): Payer: Medicare Other

## 2013-11-20 ENCOUNTER — Telehealth: Payer: Self-pay | Admitting: Physician Assistant

## 2013-11-20 ENCOUNTER — Other Ambulatory Visit: Payer: Medicare Other

## 2013-11-20 ENCOUNTER — Encounter: Payer: Self-pay | Admitting: Physician Assistant

## 2013-11-20 ENCOUNTER — Telehealth: Payer: Self-pay | Admitting: *Deleted

## 2013-11-20 ENCOUNTER — Ambulatory Visit (HOSPITAL_BASED_OUTPATIENT_CLINIC_OR_DEPARTMENT_OTHER): Payer: Medicare Other | Admitting: Physician Assistant

## 2013-11-20 VITALS — BP 134/68 | HR 97 | Temp 97.9°F | Resp 18 | Ht 62.0 in | Wt 128.4 lb

## 2013-11-20 DIAGNOSIS — C3491 Malignant neoplasm of unspecified part of right bronchus or lung: Secondary | ICD-10-CM

## 2013-11-20 DIAGNOSIS — C3410 Malignant neoplasm of upper lobe, unspecified bronchus or lung: Secondary | ICD-10-CM

## 2013-11-20 DIAGNOSIS — R059 Cough, unspecified: Secondary | ICD-10-CM

## 2013-11-20 DIAGNOSIS — R05 Cough: Secondary | ICD-10-CM

## 2013-11-20 DIAGNOSIS — Z5111 Encounter for antineoplastic chemotherapy: Secondary | ICD-10-CM

## 2013-11-20 DIAGNOSIS — R0989 Other specified symptoms and signs involving the circulatory and respiratory systems: Secondary | ICD-10-CM

## 2013-11-20 DIAGNOSIS — C3490 Malignant neoplasm of unspecified part of unspecified bronchus or lung: Secondary | ICD-10-CM

## 2013-11-20 LAB — COMPREHENSIVE METABOLIC PANEL (CC13)
ALT: <6 U/L (ref 0–55)
AST: 13 U/L (ref 5–34)
Albumin: 3.5 g/dL (ref 3.5–5.0)
Alkaline Phosphatase: 89 U/L (ref 40–150)
Anion Gap: 9 mEq/L (ref 3–11)
BUN: 9.8 mg/dL (ref 7.0–26.0)
CO2: 24 mEq/L (ref 22–29)
Calcium: 10.3 mg/dL (ref 8.4–10.4)
Chloride: 106 mEq/L (ref 98–109)
Creatinine: 0.8 mg/dL (ref 0.6–1.1)
Glucose: 100 mg/dl (ref 70–140)
Potassium: 3.9 mEq/L (ref 3.5–5.1)
Sodium: 139 mEq/L (ref 136–145)
Total Bilirubin: <0.2 mg/dL (ref 0.20–1.20)
Total Protein: 7.1 g/dL (ref 6.4–8.3)

## 2013-11-20 LAB — CBC WITH DIFFERENTIAL/PLATELET
BASO%: 0.4 % (ref 0.0–2.0)
Basophils Absolute: 0 10*3/uL (ref 0.0–0.1)
EOS%: 0.7 % (ref 0.0–7.0)
Eosinophils Absolute: 0 10*3/uL (ref 0.0–0.5)
HCT: 27.4 % — ABNORMAL LOW (ref 34.8–46.6)
HGB: 9.1 g/dL — ABNORMAL LOW (ref 11.6–15.9)
LYMPH%: 9.8 % — ABNORMAL LOW (ref 14.0–49.7)
MCH: 29.2 pg (ref 25.1–34.0)
MCHC: 33.1 g/dL (ref 31.5–36.0)
MCV: 88.1 fL (ref 79.5–101.0)
MONO#: 1.1 10*3/uL — ABNORMAL HIGH (ref 0.1–0.9)
MONO%: 14.7 % — ABNORMAL HIGH (ref 0.0–14.0)
NEUT#: 5.3 10*3/uL (ref 1.5–6.5)
NEUT%: 74.4 % (ref 38.4–76.8)
Platelets: 166 10*3/uL (ref 145–400)
RBC: 3.11 10*6/uL — ABNORMAL LOW (ref 3.70–5.45)
RDW: 14 % (ref 11.2–14.5)
WBC: 7.1 10*3/uL (ref 3.9–10.3)
lymph#: 0.7 10*3/uL — ABNORMAL LOW (ref 0.9–3.3)

## 2013-11-20 MED ORDER — HYDROCODONE-HOMATROPINE 5-1.5 MG/5ML PO SYRP: 5.0000 mL | ORAL_SOLUTION | Freq: Four times a day (QID) | ORAL | Status: DC | PRN

## 2013-11-20 MED ORDER — ONDANSETRON 16 MG/50ML IVPB (CHCC)
INTRAVENOUS | Status: AC
  Filled 2013-11-20: qty 16

## 2013-11-20 MED ORDER — SODIUM CHLORIDE 0.9 % IV SOLN
120.0000 mg/m2 | Freq: Once | INTRAVENOUS | Status: AC
  Administered 2013-11-20: 190 mg via INTRAVENOUS
  Filled 2013-11-20: qty 9.5

## 2013-11-20 MED ORDER — DEXAMETHASONE SODIUM PHOSPHATE 20 MG/5ML IJ SOLN
INTRAMUSCULAR | Status: AC
  Filled 2013-11-20: qty 5

## 2013-11-20 MED ORDER — SODIUM CHLORIDE 0.9 % IV SOLN
380.0000 mg | Freq: Once | INTRAVENOUS | Status: AC
  Administered 2013-11-20: 380 mg via INTRAVENOUS
  Filled 2013-11-20: qty 38

## 2013-11-20 MED ORDER — SODIUM CHLORIDE 0.9 % IV SOLN
Freq: Once | INTRAVENOUS | Status: AC
  Administered 2013-11-20: 11:00:00 via INTRAVENOUS

## 2013-11-20 MED ORDER — DEXAMETHASONE SODIUM PHOSPHATE 20 MG/5ML IJ SOLN
20.0000 mg | Freq: Once | INTRAMUSCULAR | Status: AC
  Administered 2013-11-20: 20 mg via INTRAVENOUS

## 2013-11-20 MED ORDER — ONDANSETRON 16 MG/50ML IVPB (CHCC)
16.0000 mg | Freq: Once | INTRAVENOUS | Status: AC
  Administered 2013-11-20: 16 mg via INTRAVENOUS

## 2013-11-20 NOTE — Patient Instructions (Signed)
Continue weekly labs as scheduled Follow up in 3 weeks with a restaging CT scan of your chest to re-evaluate your disease

## 2013-11-20 NOTE — Progress Notes (Addendum)
Claudia [name] [phone]   Fax:(336) Claudia [name], Claudia [name] [phone]  PRINCIPAL DIAGNOSIS: recurrent small cell lung cancer initially diagnosed as Limited stage small cell lung cancer in June 2011.   PRIOR THERAPY:  1. Status post 4 cycles of systemic chemotherapy with carboplatin and etoposide. Last dose was given 11/14/2009. This was concurrent with radiotherapy. 2. Status post prophylactic cranial irradiation completed January 28, 2010. 3. Status post wedge resection of the right upper lobe lung nodule under the care of Dr. Cyndia Bent on 09/21/2013 and the final pathology was consistent with small cell lung cancer.  CURRENT THERAPY: systemic chemotherapy with carboplatin for AUC of 5 on day 1 and etoposide 120 mg/M2 on days 1, 2 and 3 with Neulasta support on day 4 every 3 weeks. First dose on 10/30/2013. Status post 1 cycle.  INTERVAL HISTORY: Claudia [name] 68 y.o. female returns to the clinic today for a follow up  Visit. She is currently being treated with systemic chemotherapy with carboplatin and etoposide with neulasta support for her recurrent small cel lung cancer. She is status post 1 cycle.  Overall she tolerated the first cycle of chemotherapy without difficulty. She has not experienced any nausea or vomiting yet. She denied fever, chills, diarrhea or constipations. She does report continued cough productive of clear secretions. The cough seemed a bit worse as was her shortness of breath while she was at the beach. She requests a refill for her hycodan cough syrup.She denied hemoptysis. She denied having any significant weight loss or night sweats.  MEDICAL HISTORY: Past Medical History  Diagnosis Date  . COPD (chronic obstructive pulmonary disease)   . Anemia   . Bradycardia     mild,may be due to beta blocker therapy  . Lymphocytic colitis   . Lung cancer 2010   Dr. Julien Nordmann, finished chemo, sp radiation, left upper   . GERD (gastroesophageal reflux disease)     otc  . Arthritis   . Pneumonia     ALLERGIES:  is allergic to azithromycin and tussionex pennkinetic er.  MEDICATIONS:  Current Outpatient Prescriptions  Medication Sig Dispense Refill  . fluticasone (FLONASE) 50 MCG/ACT nasal spray Place 2 sprays into both nostrils daily.  16 g  4  . HYDROcodone-homatropine (HYCODAN) 5-1.5 MG/5ML syrup Take 5 mLs by mouth every 6 (six) hours as needed for cough.  120 mL  0  . HYDROcodone-homatropine (HYCODAN) 5-1.5 MG/5ML syrup Take 5 mLs by mouth every 6 (six) hours as needed for cough.  240 mL  0  . loratadine (CLARITIN) 10 MG tablet Take 1 tablet (10 mg total) by mouth daily.      . Probiotic Product (PROBIOTIC DAILY PO) Take 1 tablet by mouth daily.       Marland Kitchen aspirin EC 81 MG tablet Take 81 mg by mouth. Couple times a week      . cefdinir (OMNICEF) 300 MG capsule Take 1 capsule (300 mg total) by mouth 2 (two) times daily.  14 capsule  0  . Fluticasone Furoate-Vilanterol (BREO ELLIPTA) 100-25 MCG/INH AEPB Inhale 1 puff into the lungs daily.  1 each  5  . omeprazole (PRILOSEC) 20 MG capsule Take 1 capsule (20 mg total) by mouth daily.  30 capsule  11  . prochlorperazine (COMPAZINE) 10 MG tablet Take 1 tablet (10 mg total) by mouth every 6 (six) hours as needed for nausea or  vomiting.  30 tablet  0   No current facility-administered medications for this visit.    SURGICAL HISTORY:  Past Surgical History  Procedure Laterality Date  . Neck surgery  1980's  . Dilation and curettage of uterus    . Uterine fibroid surgery  2012  . Bronchoscopy  2011  . Thoracotomy Right 09/21/2013    Procedure: THORACOTOMY MAJOR;  Surgeon: Gaye Pollack, MD;  Location: [facility] OR;  Service: Thoracic;  Laterality: Right;  . Wedge resection Right 09/21/2013    Procedure: RIGHT UPPER LOBE WEDGE RESECTION;  Surgeon: Gaye Pollack, MD;  Location: [facility] OR;  Service: Thoracic;   Laterality: Right;    REVIEW OF SYSTEMS:  Constitutional: negative Eyes: negative Ears, nose, mouth, throat, and face: negative Respiratory: positive for cough and dyspnea on exertion Cardiovascular: negative Gastrointestinal: negative Genitourinary:negative Integument/breast: negative Hematologic/lymphatic: negative Musculoskeletal:negative Neurological: negative Behavioral/Psych: negative Endocrine: negative Allergic/Immunologic: negative   PHYSICAL EXAMINATION: General appearance: alert, cooperative and no distress Head: Normocephalic, without obvious abnormality, atraumatic Neck: no adenopathy Lymph nodes: Cervical, supraclavicular, and axillary nodes normal. Resp: rhonchi bilaterally Back: symmetric, no curvature. ROM normal. No CVA tenderness. Cardio: regular rate and rhythm, S1, S2 normal, no murmur, click, rub or gallop GI: soft, non-tender; bowel sounds normal; no masses,  no organomegaly Extremities: extremities normal, atraumatic, no cyanosis or edema Neurologic: Alert and oriented X 3, normal strength and tone. Normal symmetric reflexes. Normal coordination and gait  ECOG PERFORMANCE STATUS: 0 - Asymptomatic  Blood pressure 134/68, pulse 97, temperature 97.9 F (36.6 C), temperature source Oral, resp. rate 18, height 5\' 2"  (1.575 m), weight 128 lb 6.4 oz (58.242 kg).  LABORATORY DATA: Lab Results  Component Value Date   WBC 7.1 11/20/2013   HGB 9.1* 11/20/2013   HCT 27.4* 11/20/2013   MCV 88.1 11/20/2013   PLT 166 11/20/2013      Chemistry      Component Value Date/Time   NA 140 11/13/2013 0929   NA 137 11/06/2013 0906   NA 140 08/05/2011 0823   K 3.9 11/13/2013 0929   K 4.2 11/06/2013 0906   K 4.2 08/05/2011 0823   CL 102 11/06/2013 0906   CL 103 08/03/2012 0842   CL 98 08/05/2011 0823   CO2 28 11/13/2013 0929   CO2 27 11/06/2013 0906   CO2 30 08/05/2011 0823   BUN 8.9 11/13/2013 0929   BUN 15 11/06/2013 0906   BUN 13 08/05/2011 0823   CREATININE 0.8 11/13/2013 0929    CREATININE 0.76 11/06/2013 0906   CREATININE 1.0 08/05/2011 0823      Component Value Date/Time   CALCIUM 10.0 11/13/2013 0929   CALCIUM 10.4 11/06/2013 0906   CALCIUM 9.4 08/05/2011 0823   ALKPHOS 110 11/13/2013 0929   ALKPHOS 112 11/06/2013 0906   ALKPHOS 69 08/05/2011 0823   AST 14 11/13/2013 0929   AST 13 11/06/2013 0906   AST 19 08/05/2011 0823   ALT 8 11/13/2013 0929   ALT 11 11/06/2013 0906   ALT 16 08/05/2011 0823   BILITOT 0.20 11/13/2013 0929   BILITOT 1.3* 11/06/2013 0906   BILITOT 0.70 08/05/2011 0823       RADIOGRAPHIC STUDIES:  Dg Chest 2 View  10/11/2013   CLINICAL DATA:  Lung cancer.  EXAM: CHEST  2 VIEW  COMPARISON:  Chest radiograph of September 24, 2013.  FINDINGS: There is stable masslike opacity at the left hilum with scarring and retraction. Deviation of the trachea to the left is again  noted and unchanged. Right internal jugular catheter line noted on prior exam has been removed. No pneumothorax is noted. Mild right pleural effusion is noted which is unchanged. Pleural thickening is seen along the inferior portion the right lateral chest wall. This also is unchanged.  IMPRESSION: Stable left hilar prominence and scarring is noted compared to prior exam. Stable mild right pleural effusion is noted with associated pleural thickening. No pneumothorax is seen.   Electronically Signed   By: Sabino Dick M.D.   On: 10/11/2013 10:27   Dg Chest 2 View  09/24/2013   CLINICAL DATA:  R VATS  EXAM: CHEST - 2 VIEW  COMPARISON:  the previous day's study  FINDINGS: Stable right IJ central line to the mid SVC. Small right apical pneumothorax, pleural line projecting at the level of the posterior margin third rib. Stable right lateral subcutaneous emphysema. The right chest tube has been removed. Small right pleural effusion persists. Masslike opacity at the left hilum with parenchymal distortion and retraction as before. Heart size remains normal.  Visualized skeletal structures are unremarkable.   IMPRESSION: 1. Right chest tube removal with stable apical pneumothorax and small right effusion.   Electronically Signed   By: Arne Cleveland M.D.   On: 09/24/2013 07:19   Mr Jeri Cos QJ Contrast  10/13/2013   CLINICAL DATA:  Small-cell lung cancer.  Restaging.  EXAM: MRI HEAD WITHOUT AND WITH CONTRAST  TECHNIQUE: Multiplanar, multiecho pulse sequences of the brain and surrounding structures were obtained without and with intravenous contrast.  CONTRAST:  43mL MULTIHANCE GADOBENATE DIMEGLUMINE 529 MG/ML IV SOLN  COMPARISON:  09/08/2010.  FINDINGS: No acute or subacute infarction. The brain shows scattered foci of T2 and FLAIR signal within the hemispheric white matter consistent with minimal chronic small vessel disease. No cortical or large vessel infarction. No primary or metastatic mass lesion. No abnormal enhancement. No hemorrhage, hydrocephalus or extra-axial collection. No skull or skullbase lesion. No pituitary mass. No sinus disease.  IMPRESSION: No metastatic disease.  Minimal chronic small vessel change of the hemispheric white matter.   Electronically Signed   By: Nelson Chimes M.D.   On: 10/13/2013 19:34   Dg Chest Port 1 View  09/23/2013   CLINICAL DATA:  History of lung cancer status post wedge resection.  EXAM: PORTABLE CHEST - 1 VIEW  COMPARISON:  Chest x-ray 09/22/2013.  FINDINGS: Right internal jugular central venous catheter with tip terminating in the mid superior vena cava. Right-sided chest tube remains in position with tip in the lateral aspect of the apex of the right hemithorax. Small right apical pneumothorax occupying approximately 5% of the volume of the right hemithorax is now noted. Masslike opacity perihilar region and medial aspect of the left upper lobe related to prior radiation therapy, similar to recent prior studies. No acute consolidative airspace disease. Mild blunting of the right costophrenic sulcus may suggest a trace amount of right-sided pleural fluid is well. No  evidence of pulmonary edema. Heart size is normal. Mediastinal contours remain distorted. Subcutaneous emphysema in the right chest wall appears slightly decreased.  IMPRESSION: 1. Support apparatus, as above. 2. Interval reaccumulation of a small right pneumothorax in the right apex (less than 5% of the volume of the right hemithorax). There may also be a trace amount of right-sided pleural fluid (i.e., this may represent a hydropneumothorax). 3. Otherwise, the radiographic appearance the chest is largely unchanged, as above. These results will be called to the ordering clinician or representative by the Radiologist  Assistant, and communication documented in the PACS or zVision Dashboard.   Electronically Signed   By: Vinnie Langton M.D.   On: 09/23/2013 10:24   Dg Chest Port 1 View  09/22/2013   CLINICAL DATA:  Post vats  EXAM: PORTABLE CHEST - 1 VIEW  COMPARISON:  09/21/2013  FINDINGS: Right chest tube remains in place with no pneumothorax. Right central line tip is in the SVC, unchanged. Heart is normal size. Subcutaneous air within the right chest wall is stable. Stable fullness of the left hilum with post radiation changes. This is unchanged. No visible effusions. No acute bony abnormality.  IMPRESSION: Stable exam.  No pneumothorax.   Electronically Signed   By: Rolm Baptise M.D.   On: 09/22/2013 08:09   Dg Chest Port 1 View  09/21/2013   CLINICAL DATA:  Postop  EXAM: PORTABLE CHEST - 1 VIEW  COMPARISON:  08/30/2013  FINDINGS: Cardiomediastinal silhouette is stable. Right IJ Port-A-Cath in place. Persistent left hilar prominence and post radiation changes left perihilar region. There is a right chest tube in place. No pneumothorax. Subcutaneous emphysema noted right chest wall.  IMPRESSION: Right chest tube in place. No pneumothorax. Persistent left hilar prominence and postradiation changes in left upper lobe   Electronically Signed   By: Lahoma Crocker M.D.   On: 09/21/2013 14:59   ASSESSMENT AND  PLAN: This is a very pleasant 67 years old white female with limited stage small cell lung cancer diagnosed in June of 2011 status post 4 cycles of systemic chemotherapy with carboplatin and etoposide concurrent with radiation followed by prophylactic cranial irradiation and has been observation since November 2011. She recently underwent right upper lobe wedge resection of the pulmonary nodules and the final pathology was consistent with recurrent small cell lung cancer. She is currently being treated with  carboplatin for AUC of 5 on day 1 and etoposide 120 mg/M2 on days 1, 2 and 3 with Neulasta support on day 4. Status post 1 cycle. Overall she tolerated the chemotherapy without difficulty.  For her cough, I have given her a refill prescription for her Hycodan cough syrup. She may use Delsym cough syrup during the day as well as continue taking Claritin. The patient was discussed with and also seen by Dr. Julien Nordmann. She will continue with weekly labs as scheduled. She will follow up in 2 weeks prior to the start of cycle #2.   She was advised to call immediately if she has any concerning symptoms in the interval  All questions were answered. The patient knows to call the clinic with any problems, questions or concerns. We can certainly see the patient much sooner if necessary.  Carlton Adam PA-C  ADDENDUM: Hematology/Oncology Attending: I had a face to face encounter with the patient today. I recommended her care plan. This is a very pleasant 68 years old white female with recurrent small cell lung cancer. She had a recent resection of right upper lobe nodule that was consistent with small cell carcinoma. She is currently undergoing systemic chemotherapy with carboplatin and etoposide status post 1 cycle.  She tolerated the first cycle of her treatment fairly well with no significant adverse effects except for recent nasal congestion and mild cough. She was given prescription for Hycodan and the  patient was advised to use over-the-counter Claritin for the chest congestion. If no improvement she will contact her primary care physician for evaluation. She would come back for followup visit in 3 weeks after repeating CT scan  of the chest for restaging of her disease. She was advised to call immediately if she has any concerning symptoms in the interval.  Disclaimer: This note was dictated with voice recognition software. Similar sounding words can inadvertently be transcribed and may be missed upon review. Eilleen Kempf., MD 11/20/2013

## 2013-11-20 NOTE — Telephone Encounter (Signed)
Pt confirmed labs/ov per 08/24 POF, sent msg to add chemo , gave pt AVS..Marland KitchenKJ

## 2013-11-20 NOTE — Telephone Encounter (Signed)
Per staff message and POF I have scheduled appts. Advised scheduler of appts. JMW  

## 2013-11-20 NOTE — Patient Instructions (Addendum)
[place] Discharge Instructions for Patients Receiving Chemotherapy  Today you received the following chemotherapy agents carboplatin and etoposide.  To help prevent nausea and vomiting after your treatment, we encourage you to take your nausea medication as prescribed.   If you develop nausea and vomiting that is not controlled by your nausea medication, call the clinic.   BELOW ARE SYMPTOMS THAT SHOULD BE REPORTED IMMEDIATELY:  *FEVER GREATER THAN 100.5 F  *CHILLS WITH OR WITHOUT FEVER  NAUSEA AND VOMITING THAT IS NOT CONTROLLED WITH YOUR NAUSEA MEDICATION  *UNUSUAL SHORTNESS OF BREATH  *UNUSUAL BRUISING OR BLEEDING  TENDERNESS IN MOUTH AND THROAT WITH OR WITHOUT PRESENCE OF ULCERS  *URINARY PROBLEMS  *BOWEL PROBLEMS  UNUSUAL RASH Items with * indicate a potential emergency and should be followed up as soon as possible.  Feel free to call the clinic you have any questions or concerns. The clinic phone number is [phone].

## 2013-11-21 ENCOUNTER — Ambulatory Visit (HOSPITAL_BASED_OUTPATIENT_CLINIC_OR_DEPARTMENT_OTHER): Payer: Medicare Other

## 2013-11-21 VITALS — BP 116/49 | HR 88 | Temp 98.1°F

## 2013-11-21 DIAGNOSIS — Z5111 Encounter for antineoplastic chemotherapy: Secondary | ICD-10-CM

## 2013-11-21 DIAGNOSIS — C3410 Malignant neoplasm of upper lobe, unspecified bronchus or lung: Secondary | ICD-10-CM

## 2013-11-21 DIAGNOSIS — C3491 Malignant neoplasm of unspecified part of right bronchus or lung: Secondary | ICD-10-CM

## 2013-11-21 MED ORDER — ONDANSETRON 8 MG/NS 50 ML IVPB
INTRAVENOUS | Status: AC
  Filled 2013-11-21: qty 8

## 2013-11-21 MED ORDER — DEXAMETHASONE SODIUM PHOSPHATE 10 MG/ML IJ SOLN
10.0000 mg | Freq: Once | INTRAMUSCULAR | Status: AC
  Administered 2013-11-21: 10 mg via INTRAVENOUS

## 2013-11-21 MED ORDER — ONDANSETRON 8 MG/50ML IVPB (CHCC)
8.0000 mg | Freq: Once | INTRAVENOUS | Status: AC
  Administered 2013-11-21: 8 mg via INTRAVENOUS

## 2013-11-21 MED ORDER — SODIUM CHLORIDE 0.9 % IV SOLN
Freq: Once | INTRAVENOUS | Status: AC
  Administered 2013-11-21: 10:00:00 via INTRAVENOUS

## 2013-11-21 MED ORDER — DEXAMETHASONE SODIUM PHOSPHATE 10 MG/ML IJ SOLN
INTRAMUSCULAR | Status: AC
  Filled 2013-11-21: qty 1

## 2013-11-21 MED ORDER — SODIUM CHLORIDE 0.9 % IV SOLN
120.0000 mg/m2 | Freq: Once | INTRAVENOUS | Status: AC
  Administered 2013-11-21: 190 mg via INTRAVENOUS
  Filled 2013-11-21: qty 9.5

## 2013-11-21 NOTE — Patient Instructions (Signed)
[place] Discharge Instructions for Patients Receiving Chemotherapy  Today you received the following chemotherapy agents: Etoposide To help prevent nausea and vomiting after your treatment, we encourage you to take your nausea medication: Compazine 10mg  every 6 hours as needed.   If you develop nausea and vomiting that is not controlled by your nausea medication, call the clinic.   BELOW ARE SYMPTOMS THAT SHOULD BE REPORTED IMMEDIATELY:  *FEVER GREATER THAN 100.5 F  *CHILLS WITH OR WITHOUT FEVER  NAUSEA AND VOMITING THAT IS NOT CONTROLLED WITH YOUR NAUSEA MEDICATION  *UNUSUAL SHORTNESS OF BREATH  *UNUSUAL BRUISING OR BLEEDING  TENDERNESS IN MOUTH AND THROAT WITH OR WITHOUT PRESENCE OF ULCERS  *URINARY PROBLEMS  *BOWEL PROBLEMS  UNUSUAL RASH Items with * indicate a potential emergency and should be followed up as soon as possible.  Feel free to call the clinic you have any questions or concerns. The clinic phone number is [phone].

## 2013-11-22 ENCOUNTER — Ambulatory Visit: Payer: Medicare Other | Admitting: Cardiovascular Disease

## 2013-11-22 ENCOUNTER — Ambulatory Visit (HOSPITAL_BASED_OUTPATIENT_CLINIC_OR_DEPARTMENT_OTHER): Payer: Medicare Other

## 2013-11-22 VITALS — BP 137/67 | HR 70 | Temp 98.3°F | Resp 18

## 2013-11-22 DIAGNOSIS — C3410 Malignant neoplasm of upper lobe, unspecified bronchus or lung: Secondary | ICD-10-CM

## 2013-11-22 DIAGNOSIS — C3491 Malignant neoplasm of unspecified part of right bronchus or lung: Secondary | ICD-10-CM

## 2013-11-22 DIAGNOSIS — Z5111 Encounter for antineoplastic chemotherapy: Secondary | ICD-10-CM

## 2013-11-22 MED ORDER — ONDANSETRON 8 MG/NS 50 ML IVPB
INTRAVENOUS | Status: AC
  Filled 2013-11-22: qty 8

## 2013-11-22 MED ORDER — SODIUM CHLORIDE 0.9 % IV SOLN
Freq: Once | INTRAVENOUS | Status: AC
  Administered 2013-11-22: 09:00:00 via INTRAVENOUS

## 2013-11-22 MED ORDER — DEXAMETHASONE SODIUM PHOSPHATE 10 MG/ML IJ SOLN
INTRAMUSCULAR | Status: AC
Start: 2013-11-22 — End: 2013-11-22
  Filled 2013-11-22: qty 1

## 2013-11-22 MED ORDER — DEXAMETHASONE SODIUM PHOSPHATE 10 MG/ML IJ SOLN
10.0000 mg | Freq: Once | INTRAMUSCULAR | Status: AC
  Administered 2013-11-22: 10 mg via INTRAVENOUS

## 2013-11-22 MED ORDER — ONDANSETRON 8 MG/50ML IVPB (CHCC)
8.0000 mg | Freq: Once | INTRAVENOUS | Status: AC
  Administered 2013-11-22: 8 mg via INTRAVENOUS

## 2013-11-22 MED ORDER — SODIUM CHLORIDE 0.9 % IV SOLN
120.0000 mg/m2 | Freq: Once | INTRAVENOUS | Status: AC
  Administered 2013-11-22: 190 mg via INTRAVENOUS
  Filled 2013-11-22: qty 9.5

## 2013-11-22 NOTE — Patient Instructions (Signed)
[facility] [facility] Discharge Instructions for Patients Receiving Chemotherapy  Today you received the following chemotherapy agents Etoposide.   To help prevent nausea and vomiting after your treatment, we encourage you to take your nausea medication as directed.    If you develop nausea and vomiting that is not controlled by your nausea medication, call the clinic.   BELOW ARE SYMPTOMS THAT SHOULD BE REPORTED IMMEDIATELY:  *FEVER GREATER THAN 100.5 F  *CHILLS WITH OR WITHOUT FEVER  NAUSEA AND VOMITING THAT IS NOT CONTROLLED WITH YOUR NAUSEA MEDICATION  *UNUSUAL SHORTNESS OF BREATH  *UNUSUAL BRUISING OR BLEEDING  TENDERNESS IN MOUTH AND THROAT WITH OR WITHOUT PRESENCE OF ULCERS  *URINARY PROBLEMS  *BOWEL PROBLEMS  UNUSUAL RASH Items with * indicate a potential emergency and should be followed up as soon as possible.  Feel free to call the clinic you have any questions or concerns. The clinic phone number is [phone].

## 2013-11-23 ENCOUNTER — Ambulatory Visit (HOSPITAL_BASED_OUTPATIENT_CLINIC_OR_DEPARTMENT_OTHER): Payer: Medicare Other

## 2013-11-23 VITALS — BP 139/58 | HR 70 | Temp 98.0°F

## 2013-11-23 DIAGNOSIS — C3491 Malignant neoplasm of unspecified part of right bronchus or lung: Secondary | ICD-10-CM

## 2013-11-23 DIAGNOSIS — C3410 Malignant neoplasm of upper lobe, unspecified bronchus or lung: Secondary | ICD-10-CM

## 2013-11-23 DIAGNOSIS — Z5189 Encounter for other specified aftercare: Secondary | ICD-10-CM

## 2013-11-23 MED ORDER — PEGFILGRASTIM INJECTION 6 MG/0.6ML
6.0000 mg | Freq: Once | SUBCUTANEOUS | Status: AC
  Administered 2013-11-23: 6 mg via SUBCUTANEOUS
  Filled 2013-11-23: qty 0.6

## 2013-11-27 ENCOUNTER — Other Ambulatory Visit (HOSPITAL_BASED_OUTPATIENT_CLINIC_OR_DEPARTMENT_OTHER): Payer: Medicare Other

## 2013-11-27 DIAGNOSIS — C3491 Malignant neoplasm of unspecified part of right bronchus or lung: Secondary | ICD-10-CM

## 2013-11-27 DIAGNOSIS — C3410 Malignant neoplasm of upper lobe, unspecified bronchus or lung: Secondary | ICD-10-CM

## 2013-11-27 LAB — CBC WITH DIFFERENTIAL/PLATELET
BASO%: 0.8 % (ref 0.0–2.0)
Basophils Absolute: 0 10*3/uL (ref 0.0–0.1)
EOS%: 0 % (ref 0.0–7.0)
Eosinophils Absolute: 0 10*3/uL (ref 0.0–0.5)
HCT: 23.3 % — ABNORMAL LOW (ref 34.8–46.6)
HGB: 7.6 g/dL — ABNORMAL LOW (ref 11.6–15.9)
LYMPH%: 13.2 % — ABNORMAL LOW (ref 14.0–49.7)
MCH: 28.8 pg (ref 25.1–34.0)
MCHC: 32.6 g/dL (ref 31.5–36.0)
MCV: 88.3 fL (ref 79.5–101.0)
MONO#: 0.1 10*3/uL (ref 0.1–0.9)
MONO%: 2.8 % (ref 0.0–14.0)
NEUT#: 2.1 10*3/uL (ref 1.5–6.5)
NEUT%: 83.2 % — ABNORMAL HIGH (ref 38.4–76.8)
Platelets: 172 10*3/uL (ref 145–400)
RBC: 2.64 10*6/uL — ABNORMAL LOW (ref 3.70–5.45)
RDW: 14.7 % — ABNORMAL HIGH (ref 11.2–14.5)
WBC: 2.5 10*3/uL — ABNORMAL LOW (ref 3.9–10.3)
lymph#: 0.3 10*3/uL — ABNORMAL LOW (ref 0.9–3.3)

## 2013-11-27 LAB — COMPREHENSIVE METABOLIC PANEL (CC13)
ALT: 9 U/L (ref 0–55)
AST: 12 U/L (ref 5–34)
Albumin: 3.4 g/dL — ABNORMAL LOW (ref 3.5–5.0)
Alkaline Phosphatase: 98 U/L (ref 40–150)
Anion Gap: 6 mEq/L (ref 3–11)
BUN: 16 mg/dL (ref 7.0–26.0)
CO2: 27 mEq/L (ref 22–29)
Calcium: 9.7 mg/dL (ref 8.4–10.4)
Chloride: 105 mEq/L (ref 98–109)
Creatinine: 0.8 mg/dL (ref 0.6–1.1)
Glucose: 93 mg/dl (ref 70–140)
Potassium: 4.1 mEq/L (ref 3.5–5.1)
Sodium: 138 mEq/L (ref 136–145)
Total Bilirubin: 1.21 mg/dL — ABNORMAL HIGH (ref 0.20–1.20)
Total Protein: 6.7 g/dL (ref 6.4–8.3)

## 2013-12-05 ENCOUNTER — Telehealth: Payer: Self-pay | Admitting: *Deleted

## 2013-12-05 ENCOUNTER — Ambulatory Visit (HOSPITAL_COMMUNITY)
Admission: RE | Admit: 2013-12-05 | Discharge: 2013-12-05 | Disposition: A | Discharge status: Home / Self Care | Payer: Medicare Other | Source: Ambulatory Visit | Attending: Internal Medicine | Admitting: Internal Medicine

## 2013-12-05 ENCOUNTER — Other Ambulatory Visit (HOSPITAL_BASED_OUTPATIENT_CLINIC_OR_DEPARTMENT_OTHER): Payer: Medicare Other

## 2013-12-05 ENCOUNTER — Other Ambulatory Visit: Payer: Self-pay | Admitting: Medical Oncology

## 2013-12-05 ENCOUNTER — Telehealth: Payer: Self-pay | Admitting: Medical Oncology

## 2013-12-05 DIAGNOSIS — C3491 Malignant neoplasm of unspecified part of right bronchus or lung: Secondary | ICD-10-CM

## 2013-12-05 DIAGNOSIS — D649 Anemia, unspecified: Secondary | ICD-10-CM

## 2013-12-05 DIAGNOSIS — C3410 Malignant neoplasm of upper lobe, unspecified bronchus or lung: Secondary | ICD-10-CM

## 2013-12-05 LAB — CBC WITH DIFFERENTIAL/PLATELET
BASO%: 0.4 % (ref 0.0–2.0)
Basophils Absolute: 0.1 10*3/uL (ref 0.0–0.1)
EOS%: 0.2 % (ref 0.0–7.0)
Eosinophils Absolute: 0 10*3/uL (ref 0.0–0.5)
HCT: 20.5 % — ABNORMAL LOW (ref 34.8–46.6)
HGB: 6.5 g/dL — CL (ref 11.6–15.9)
LYMPH%: 5.4 % — ABNORMAL LOW (ref 14.0–49.7)
MCH: 27.9 pg (ref 25.1–34.0)
MCHC: 31.7 g/dL (ref 31.5–36.0)
MCV: 87.9 fL (ref 79.5–101.0)
MONO#: 1.4 10*3/uL — ABNORMAL HIGH (ref 0.1–0.9)
MONO%: 9.2 % (ref 0.0–14.0)
NEUT#: 13 10*3/uL — ABNORMAL HIGH (ref 1.5–6.5)
NEUT%: 84.8 % — ABNORMAL HIGH (ref 38.4–76.8)
Platelets: 23 10*3/uL — ABNORMAL LOW (ref 145–400)
RBC: 2.33 10*6/uL — ABNORMAL LOW (ref 3.70–5.45)
RDW: 14.5 % (ref 11.2–14.5)
WBC: 15.4 10*3/uL — ABNORMAL HIGH (ref 3.9–10.3)
lymph#: 0.8 10*3/uL — ABNORMAL LOW (ref 0.9–3.3)

## 2013-12-05 LAB — COMPREHENSIVE METABOLIC PANEL (CC13)
ALT: <6 U/L (ref 0–55)
AST: 14 U/L (ref 5–34)
Albumin: 3.1 g/dL — ABNORMAL LOW (ref 3.5–5.0)
Alkaline Phosphatase: 97 U/L (ref 40–150)
Anion Gap: 8 mEq/L (ref 3–11)
BUN: 6.4 mg/dL — ABNORMAL LOW (ref 7.0–26.0)
CO2: 27 mEq/L (ref 22–29)
Calcium: 9.4 mg/dL (ref 8.4–10.4)
Chloride: 104 mEq/L (ref 98–109)
Creatinine: 0.9 mg/dL (ref 0.6–1.1)
Glucose: 92 mg/dl (ref 70–140)
Potassium: 3.8 mEq/L (ref 3.5–5.1)
Sodium: 139 mEq/L (ref 136–145)
Total Bilirubin: <0.2 mg/dL (ref 0.20–1.20)
Total Protein: 6.6 g/dL (ref 6.4–8.3)

## 2013-12-05 NOTE — Telephone Encounter (Signed)
Per desk RN I have scheduled appt for Friday. Desk RN to call patient

## 2013-12-05 NOTE — Progress Notes (Signed)
HAR done, Pt notified of appts.

## 2013-12-05 NOTE — Telephone Encounter (Signed)
I left a message for pt to call me back re labs.

## 2013-12-05 NOTE — Telephone Encounter (Signed)
I let another message for pt to call me back about getting type and cross on thursday and blood on Friday

## 2013-12-06 ENCOUNTER — Other Ambulatory Visit: Payer: Medicare Other

## 2013-12-06 DIAGNOSIS — D649 Anemia, unspecified: Secondary | ICD-10-CM | POA: Diagnosis not present

## 2013-12-08 ENCOUNTER — Ambulatory Visit: Payer: Medicare Other | Admitting: Emergency Medicine

## 2013-12-08 ENCOUNTER — Ambulatory Visit (HOSPITAL_COMMUNITY)
Admission: RE | Admit: 2013-12-08 | Discharge: 2013-12-08 | Disposition: A | Discharge status: Home / Self Care | Payer: Medicare Other | Source: Ambulatory Visit | Attending: Physician Assistant | Admitting: Physician Assistant

## 2013-12-08 ENCOUNTER — Ambulatory Visit (HOSPITAL_BASED_OUTPATIENT_CLINIC_OR_DEPARTMENT_OTHER): Payer: Medicare Other

## 2013-12-08 VITALS — BP 119/66 | HR 83 | Temp 98.0°F | Resp 18

## 2013-12-08 DIAGNOSIS — D649 Anemia, unspecified: Secondary | ICD-10-CM

## 2013-12-08 DIAGNOSIS — Z9221 Personal history of antineoplastic chemotherapy: Secondary | ICD-10-CM | POA: Insufficient documentation

## 2013-12-08 DIAGNOSIS — C3490 Malignant neoplasm of unspecified part of unspecified bronchus or lung: Secondary | ICD-10-CM | POA: Insufficient documentation

## 2013-12-08 DIAGNOSIS — Z923 Personal history of irradiation: Secondary | ICD-10-CM | POA: Diagnosis not present

## 2013-12-08 LAB — PREPARE RBC (CROSSMATCH)

## 2013-12-08 MED ORDER — DIPHENHYDRAMINE HCL 25 MG PO CAPS
25.0000 mg | ORAL_CAPSULE | Freq: Once | ORAL | Status: AC
  Administered 2013-12-08: 25 mg via ORAL

## 2013-12-08 MED ORDER — SODIUM CHLORIDE 0.9 % IV SOLN
250.0000 mL | Freq: Once | INTRAVENOUS | Status: AC
  Administered 2013-12-08: 250 mL via INTRAVENOUS

## 2013-12-08 MED ORDER — ACETAMINOPHEN 325 MG PO TABS
ORAL_TABLET | ORAL | Status: AC
  Filled 2013-12-08: qty 2

## 2013-12-08 MED ORDER — DIPHENHYDRAMINE HCL 25 MG PO CAPS
ORAL_CAPSULE | ORAL | Status: AC
  Filled 2013-12-08: qty 1

## 2013-12-08 MED ORDER — IOHEXOL 300 MG/ML  SOLN
80.0000 mL | Freq: Once | INTRAMUSCULAR | Status: AC | PRN
  Administered 2013-12-08: 80 mL via INTRAVENOUS

## 2013-12-08 MED ORDER — ACETAMINOPHEN 325 MG PO TABS
650.0000 mg | ORAL_TABLET | Freq: Once | ORAL | Status: AC
  Administered 2013-12-08: 650 mg via ORAL

## 2013-12-08 NOTE — Patient Instructions (Signed)

## 2013-12-08 NOTE — Progress Notes (Signed)
Pt. Saw primary care MD yesterday for wellness check and was told to wait on flu vaccine for now as she has a sinus issue going on right now (no antibiotics) and is for transfusion for HGB of 6.5 today.

## 2013-12-09 LAB — TYPE AND SCREEN
ABO/RH(D): B POS
Antibody Screen: NEGATIVE
Unit division: 0
Unit division: 0

## 2013-12-11 ENCOUNTER — Telehealth: Payer: Self-pay | Admitting: Internal Medicine

## 2013-12-11 ENCOUNTER — Ambulatory Visit (HOSPITAL_BASED_OUTPATIENT_CLINIC_OR_DEPARTMENT_OTHER): Payer: Medicare Other

## 2013-12-11 ENCOUNTER — Encounter: Payer: Self-pay | Admitting: Internal Medicine

## 2013-12-11 ENCOUNTER — Ambulatory Visit (HOSPITAL_BASED_OUTPATIENT_CLINIC_OR_DEPARTMENT_OTHER): Payer: Medicare Other | Admitting: Internal Medicine

## 2013-12-11 ENCOUNTER — Other Ambulatory Visit (HOSPITAL_BASED_OUTPATIENT_CLINIC_OR_DEPARTMENT_OTHER): Payer: Medicare Other

## 2013-12-11 VITALS — BP 135/70 | HR 89 | Temp 98.8°F | Resp 18 | Ht 62.0 in | Wt 129.6 lb

## 2013-12-11 DIAGNOSIS — C3491 Malignant neoplasm of unspecified part of right bronchus or lung: Secondary | ICD-10-CM

## 2013-12-11 DIAGNOSIS — Z5111 Encounter for antineoplastic chemotherapy: Secondary | ICD-10-CM

## 2013-12-11 DIAGNOSIS — C3410 Malignant neoplasm of upper lobe, unspecified bronchus or lung: Secondary | ICD-10-CM

## 2013-12-11 DIAGNOSIS — R0989 Other specified symptoms and signs involving the circulatory and respiratory systems: Secondary | ICD-10-CM

## 2013-12-11 DIAGNOSIS — Z23 Encounter for immunization: Secondary | ICD-10-CM

## 2013-12-11 LAB — CBC WITH DIFFERENTIAL/PLATELET
BASO%: 0.3 % (ref 0.0–2.0)
Basophils Absolute: 0 10*3/uL (ref 0.0–0.1)
EOS%: 0.3 % (ref 0.0–7.0)
Eosinophils Absolute: 0 10*3/uL (ref 0.0–0.5)
HCT: 34.9 % (ref 34.8–46.6)
HGB: 11.6 g/dL (ref 11.6–15.9)
LYMPH%: 5.1 % — ABNORMAL LOW (ref 14.0–49.7)
MCH: 29.2 pg (ref 25.1–34.0)
MCHC: 33.2 g/dL (ref 31.5–36.0)
MCV: 87.9 fL (ref 79.5–101.0)
MONO#: 1.4 10*3/uL — ABNORMAL HIGH (ref 0.1–0.9)
MONO%: 14 % (ref 0.0–14.0)
NEUT#: 8 10*3/uL — ABNORMAL HIGH (ref 1.5–6.5)
NEUT%: 80.3 % — ABNORMAL HIGH (ref 38.4–76.8)
Platelets: 122 10*3/uL — ABNORMAL LOW (ref 145–400)
RBC: 3.97 10*6/uL (ref 3.70–5.45)
RDW: 15.1 % — ABNORMAL HIGH (ref 11.2–14.5)
WBC: 10 10*3/uL (ref 3.9–10.3)
lymph#: 0.5 10*3/uL — ABNORMAL LOW (ref 0.9–3.3)

## 2013-12-11 LAB — COMPREHENSIVE METABOLIC PANEL (CC13)
ALT: 9 U/L (ref 0–55)
AST: 16 U/L (ref 5–34)
Albumin: 3.4 g/dL — ABNORMAL LOW (ref 3.5–5.0)
Alkaline Phosphatase: 101 U/L (ref 40–150)
Anion Gap: 11 mEq/L (ref 3–11)
BUN: 10.3 mg/dL (ref 7.0–26.0)
CO2: 23 mEq/L (ref 22–29)
Calcium: 10.4 mg/dL (ref 8.4–10.4)
Chloride: 106 mEq/L (ref 98–109)
Creatinine: 0.9 mg/dL (ref 0.6–1.1)
Glucose: 95 mg/dl (ref 70–140)
Potassium: 4 mEq/L (ref 3.5–5.1)
Sodium: 139 mEq/L (ref 136–145)
Total Bilirubin: 0.34 mg/dL (ref 0.20–1.20)
Total Protein: 7.4 g/dL (ref 6.4–8.3)

## 2013-12-11 MED ORDER — AMOXICILLIN 500 MG PO TABS: 500.0000 mg | ORAL_TABLET | Freq: Two times a day (BID) | ORAL | Status: DC

## 2013-12-11 MED ORDER — SODIUM CHLORIDE 0.9 % IV SOLN
380.5000 mg | Freq: Once | INTRAVENOUS | Status: AC
  Administered 2013-12-11: 380 mg via INTRAVENOUS
  Filled 2013-12-11: qty 38

## 2013-12-11 MED ORDER — DEXAMETHASONE SODIUM PHOSPHATE 20 MG/5ML IJ SOLN
INTRAMUSCULAR | Status: AC
  Filled 2013-12-11: qty 5

## 2013-12-11 MED ORDER — SODIUM CHLORIDE 0.9 % IV SOLN
Freq: Once | INTRAVENOUS | Status: AC
  Administered 2013-12-11: 10:00:00 via INTRAVENOUS

## 2013-12-11 MED ORDER — INFLUENZA VAC SPLIT QUAD 0.5 ML IM SUSY
0.5000 mL | PREFILLED_SYRINGE | Freq: Once | INTRAMUSCULAR | Status: AC
  Administered 2013-12-11: 0.5 mL via INTRAMUSCULAR
  Filled 2013-12-11: qty 0.5

## 2013-12-11 MED ORDER — ONDANSETRON 16 MG/50ML IVPB (CHCC)
16.0000 mg | Freq: Once | INTRAVENOUS | Status: AC
  Administered 2013-12-11: 16 mg via INTRAVENOUS

## 2013-12-11 MED ORDER — CARBOPLATIN CHEMO INTRADERMAL TEST DOSE 100MCG/0.02ML
100.0000 ug | Freq: Once | INTRADERMAL | Status: AC
  Administered 2013-12-11: 100 ug via INTRADERMAL
  Filled 2013-12-11: qty 0.01

## 2013-12-11 MED ORDER — DEXAMETHASONE SODIUM PHOSPHATE 20 MG/5ML IJ SOLN
20.0000 mg | Freq: Once | INTRAMUSCULAR | Status: AC
  Administered 2013-12-11: 20 mg via INTRAVENOUS

## 2013-12-11 MED ORDER — SODIUM CHLORIDE 0.9 % IV SOLN
120.0000 mg/m2 | Freq: Once | INTRAVENOUS | Status: AC
  Administered 2013-12-11: 190 mg via INTRAVENOUS
  Filled 2013-12-11: qty 9.5

## 2013-12-11 MED ORDER — ONDANSETRON 16 MG/50ML IVPB (CHCC)
INTRAVENOUS | Status: AC
  Filled 2013-12-11: qty 16

## 2013-12-11 NOTE — Patient Instructions (Signed)
[place] Discharge Instructions for Patients Receiving Chemotherapy  Today you received the following chemotherapy agents: Carboplatin and Etoposide   To help prevent nausea and vomiting after your treatment, we encourage you to take your nausea medication as prescribed.    If you develop nausea and vomiting that is not controlled by your nausea medication, call the clinic.   BELOW ARE SYMPTOMS THAT SHOULD BE REPORTED IMMEDIATELY:  *FEVER GREATER THAN 100.5 F  *CHILLS WITH OR WITHOUT FEVER  NAUSEA AND VOMITING THAT IS NOT CONTROLLED WITH YOUR NAUSEA MEDICATION  *UNUSUAL SHORTNESS OF BREATH  *UNUSUAL BRUISING OR BLEEDING  TENDERNESS IN MOUTH AND THROAT WITH OR WITHOUT PRESENCE OF ULCERS  *URINARY PROBLEMS  *BOWEL PROBLEMS  UNUSUAL RASH Items with * indicate a potential emergency and should be followed up as soon as possible.  Feel free to call the clinic you have any questions or concerns. The clinic phone number is [phone].

## 2013-12-11 NOTE — Progress Notes (Signed)
[facility] [phone]   Fax:(336) [place], [place] [address]  PRINCIPAL DIAGNOSIS: recurrent small cell lung cancer initially diagnosed as Limited stage small cell lung cancer in June 2011.   PRIOR THERAPY:  1. Status post 4 cycles of systemic chemotherapy with carboplatin and etoposide. Last dose was given 11/14/2009. This was concurrent with radiotherapy. 2. Status post prophylactic cranial irradiation completed January 28, 2010. 3. Status post wedge resection of the right upper lobe lung nodule under the care of Dr. Cyndia Bent on 09/21/2013 and the final pathology was consistent with small cell lung cancer.  CURRENT THERAPY: systemic chemotherapy with carboplatin for AUC of 5 on day 1 and etoposide 120 mg/M2 on days 1, 2 and 3 with Neulasta support on day 4 every 3 weeks. First dose on 10/30/2013. Status post 2 cycles  INTERVAL HISTORY: Claudia [name] 68 y.o. female returns to the clinic today for followup visit. The patient tolerated the second cycle of her systemic chemotherapy with carboplatin and etoposide fairly well with no significant adverse effects. She denied having any fever or chills. She denied having any nausea or vomiting. She is complaining about some sinus drainage and she was seen recently by her primary care physician but he was not comfortable prescribing her any medication because of her cancer history. She denied having any significant shortness of breath, cough or hemoptysis. She denied having any significant weight loss or night sweats. She had repeat CT scan of the chest performed recently and she is here for evaluation and discussion of her scan results.   MEDICAL HISTORY: Past Medical History  Diagnosis Date  . COPD (chronic obstructive pulmonary disease)   . Anemia   . Bradycardia     mild,may be due to beta blocker therapy  . Lymphocytic colitis     . Lung cancer 2010    Dr. Julien Nordmann, finished chemo, sp radiation, left upper   . GERD (gastroesophageal reflux disease)     otc  . Arthritis   . Pneumonia     ALLERGIES:  is allergic to azithromycin and tussionex pennkinetic er.  MEDICATIONS:  Current Outpatient Prescriptions  Medication Sig Dispense Refill  . aspirin EC 81 MG tablet Take 81 mg by mouth. Couple times a week      . fluticasone (FLONASE) 50 MCG/ACT nasal spray Place 2 sprays into both nostrils daily.  16 g  4  . Fluticasone Furoate-Vilanterol (BREO ELLIPTA) 100-25 MCG/INH AEPB Inhale 1 puff into the lungs daily.  1 each  5  . loratadine (CLARITIN) 10 MG tablet Take 1 tablet (10 mg total) by mouth daily.      Marland Kitchen omeprazole (PRILOSEC) 20 MG capsule Take 1 capsule (20 mg total) by mouth daily.  30 capsule  11  . Probiotic Product (PROBIOTIC DAILY PO) Take 1 tablet by mouth daily.       . prochlorperazine (COMPAZINE) 10 MG tablet Take 1 tablet (10 mg total) by mouth every 6 (six) hours as needed for nausea or vomiting.  30 tablet  0   No current facility-administered medications for this visit.    SURGICAL HISTORY:  Past Surgical History  Procedure Laterality Date  . Neck surgery  1980's  . Dilation and curettage of uterus    . Uterine fibroid surgery  2012  . Bronchoscopy  2011  . Thoracotomy Right 09/21/2013    Procedure: THORACOTOMY MAJOR;  Surgeon:  Gaye Pollack, MD;  Location: [facility] OR;  Service: Thoracic;  Laterality: Right;  . Wedge resection Right 09/21/2013    Procedure: RIGHT UPPER LOBE WEDGE RESECTION;  Surgeon: Gaye Pollack, MD;  Location: [facility] OR;  Service: Thoracic;  Laterality: Right;    REVIEW OF SYSTEMS:  Constitutional: negative Eyes: negative Ears, nose, mouth, throat, and face: negative Respiratory: positive for pleurisy/chest pain Cardiovascular: negative Gastrointestinal: negative Genitourinary:negative Integument/breast: negative Hematologic/lymphatic:  negative Musculoskeletal:negative Neurological: negative Behavioral/Psych: negative Endocrine: negative Allergic/Immunologic: negative   PHYSICAL EXAMINATION: General appearance: alert, cooperative and no distress Head: Normocephalic, without obvious abnormality, atraumatic Neck: no adenopathy Lymph nodes: Cervical, supraclavicular, and axillary nodes normal. Resp: clear to auscultation bilaterally Back: symmetric, no curvature. ROM normal. No CVA tenderness. Cardio: regular rate and rhythm, S1, S2 normal, no murmur, click, rub or gallop GI: soft, non-tender; bowel sounds normal; no masses,  no organomegaly Extremities: extremities normal, atraumatic, no cyanosis or edema Neurologic: Alert and oriented X 3, normal strength and tone. Normal symmetric reflexes. Normal coordination and gait  ECOG PERFORMANCE STATUS: 0 - Asymptomatic  Blood pressure 135/70, pulse 89, temperature 98.8 F (37.1 C), temperature source Oral, resp. rate 18, height 5\' 2"  (1.575 m), weight 129 lb 9.6 oz (58.786 kg), SpO2 99.00%.  LABORATORY DATA: Lab Results  Component Value Date   WBC 15.4* 12/05/2013   HGB 6.5* 12/05/2013   HCT 20.5* 12/05/2013   MCV 87.9 12/05/2013   PLT 23* 12/05/2013      Chemistry      Component Value Date/Time   NA 139 12/05/2013 1043   NA 137 11/06/2013 0906   NA 140 08/05/2011 0823   K 3.8 12/05/2013 1043   K 4.2 11/06/2013 0906   K 4.2 08/05/2011 0823   CL 102 11/06/2013 0906   CL 103 08/03/2012 0842   CL 98 08/05/2011 0823   CO2 27 12/05/2013 1043   CO2 27 11/06/2013 0906   CO2 30 08/05/2011 0823   BUN 6.4* 12/05/2013 1043   BUN 15 11/06/2013 0906   BUN 13 08/05/2011 0823   CREATININE 0.9 12/05/2013 1043   CREATININE 0.76 11/06/2013 0906   CREATININE 1.0 08/05/2011 0823      Component Value Date/Time   CALCIUM 9.4 12/05/2013 1043   CALCIUM 10.4 11/06/2013 0906   CALCIUM 9.4 08/05/2011 0823   ALKPHOS 97 12/05/2013 1043   ALKPHOS 112 11/06/2013 0906   ALKPHOS 69 08/05/2011 0823   AST 14 12/05/2013 1043    AST 13 11/06/2013 0906   AST 19 08/05/2011 0823   ALT <6 12/05/2013 1043   ALT 11 11/06/2013 0906   ALT 16 08/05/2011 0823   BILITOT <0.20 12/05/2013 1043   BILITOT 1.3* 11/06/2013 0906   BILITOT 0.70 08/05/2011 0823       RADIOGRAPHIC STUDIES: Ct Chest W Contrast  12/08/2013   CLINICAL DATA:  History of lung cancer diagnosed in 2010. Chemotherapy and radiation therapy complete.  EXAM: CT CHEST WITH CONTRAST  TECHNIQUE: Multidetector CT imaging of the chest was performed during intravenous contrast administration.  CONTRAST:  105mL OMNIPAQUE IOHEXOL 300 MG/ML  SOLN  COMPARISON:  Chest CT 07/28/2013.  FINDINGS: Mediastinum: Heart size is normal. Small amount of pericardial fluid and/or thickening, unlikely to be of hemodynamic significance at this time. No associated pericardial calcification. Prominent soft tissue in the left hilar region, more pronounced than prior examinations; the possibility of malignant nodal tissue in this region is difficult to exclude. No other mediastinal lymphadenopathy is noted. Atherosclerosis of  the thoracic aorta and great vessels of the mediastinum. Esophagus is unremarkable in appearance.  Lungs/Pleura: Compared to the prior examination there are new postoperative changes of wedge resection in the medial aspect of the right upper lobe, for removal of the previously noted right upper lobe pulmonary nodule. There is a small loculated pleural fluid collection adjacent to the resection line in the medial aspect of the anterior right hemithorax, which is otherwise simple in appearance (specifically, no definite pleural nodularity). A trace amount of dependent pleural fluid is also noted in the right hemithorax. Interval increase in what is now a moderate partially loculated left-sided pleural effusion. Persistent architectural distortion and volume loss in the left suprahilar region again noted, related to prior radiation therapy. Several tiny pulmonary nodules are seen scattered  throughout the right lung, similar to prior examinations, largest of which measure 5 mm in the right lower lobe (image 40 of series 5) and 4 mm in the right upper lobe (image 25 of series 5). Mild centrilobular emphysema.  Upper Abdomen: 2.3 cm intermediate attenuation lesion in segment 2 of the liver is incompletely characterized, but unchanged compared to numerous prior examinations dating back to 2012, presumably benign.  Musculoskeletal: There are no aggressive appearing lytic or blastic lesions noted in the visualized portions of the skeleton.  IMPRESSION: 1. There has been interval wedge resection of the previously noted nodule in the medial aspect of the right upper lobe. Small postoperative loculated pleural fluid collection in the medial aspect of the right hemithorax anteriorly. 2. Increasing soft tissue prominence in the left perihilar region. This could represent reactive lymphadenopathy or malignant lymphadenopathy, and close attention on future followup examinations is recommended. 3. In addition, there is an increasing partially loculated left-sided pleural effusion 4. Trace amount of pericardial fluid and/or thickening anteriorly, unlikely to be of hemodynamic significance at this time. 5. Additional incidental findings, as above.   Electronically Signed   By: Vinnie Langton M.D.   On: 12/08/2013 17:15   ASSESSMENT AND PLAN: This is a very pleasant 68 years old white female with limited stage small cell lung cancer diagnosed in June of 2011 status post 4 cycles of systemic chemotherapy with carboplatin and etoposide concurrent with radiation followed by prophylactic cranial irradiation and has been observation since November 2007. She recently underwent right upper lobe wedge resection of the pulmonary nodules and the final pathology was consistent with recurrent small cell lung cancer. She is tolerating her treatment fairly well with no significant adverse effects. The CT scan showed no  evidence for disease progression. I discussed the scan results with the patient today. I recommended for her to continue her current systemic chemotherapy with carboplatin and etoposide as scheduled. She will start cycle #3 today. For the questionable sinusitis, I will start the patient on amoxicillin 500 mg by mouth twice a day for one week. She would come back for follow up visit in 3 weeks for reevaluation and management any adverse effect of her treatment. She was advised to call immediately if she has any concerning symptoms in the interval  All questions were answered. The patient knows to call the clinic with any problems, questions or concerns. We can certainly see the patient much sooner if necessary.  Disclaimer: This note was dictated with voice recognition software. Similar sounding words can inadvertently be transcribed and may not be corrected upon review.

## 2013-12-11 NOTE — Telephone Encounter (Signed)
gv adn pritned appt sched and avs for pt for Sept and OCT....sed added tx.

## 2013-12-12 ENCOUNTER — Ambulatory Visit (HOSPITAL_BASED_OUTPATIENT_CLINIC_OR_DEPARTMENT_OTHER): Payer: Medicare Other

## 2013-12-12 VITALS — BP 133/56 | HR 65 | Temp 98.0°F | Resp 20

## 2013-12-12 DIAGNOSIS — C3491 Malignant neoplasm of unspecified part of right bronchus or lung: Secondary | ICD-10-CM

## 2013-12-12 DIAGNOSIS — C3410 Malignant neoplasm of upper lobe, unspecified bronchus or lung: Secondary | ICD-10-CM

## 2013-12-12 DIAGNOSIS — Z5111 Encounter for antineoplastic chemotherapy: Secondary | ICD-10-CM

## 2013-12-12 MED ORDER — SODIUM CHLORIDE 0.9 % IV SOLN
Freq: Once | INTRAVENOUS | Status: AC
  Administered 2013-12-12: 15:00:00 via INTRAVENOUS

## 2013-12-12 MED ORDER — ONDANSETRON 8 MG/NS 50 ML IVPB
INTRAVENOUS | Status: AC
  Filled 2013-12-12: qty 8

## 2013-12-12 MED ORDER — DEXAMETHASONE SODIUM PHOSPHATE 10 MG/ML IJ SOLN
10.0000 mg | Freq: Once | INTRAMUSCULAR | Status: AC
  Administered 2013-12-12: 10 mg via INTRAVENOUS

## 2013-12-12 MED ORDER — ONDANSETRON 8 MG/50ML IVPB (CHCC)
8.0000 mg | Freq: Once | INTRAVENOUS | Status: AC
  Administered 2013-12-12: 8 mg via INTRAVENOUS

## 2013-12-12 MED ORDER — DEXAMETHASONE SODIUM PHOSPHATE 10 MG/ML IJ SOLN
INTRAMUSCULAR | Status: AC
  Filled 2013-12-12: qty 1

## 2013-12-12 MED ORDER — SODIUM CHLORIDE 0.9 % IV SOLN
120.0000 mg/m2 | Freq: Once | INTRAVENOUS | Status: AC
  Administered 2013-12-12: 190 mg via INTRAVENOUS
  Filled 2013-12-12: qty 9.5

## 2013-12-12 NOTE — Patient Instructions (Signed)
[facility] Discharge Instructions for Patients Receiving Chemotherapy  Today you received the following chemotherapy agents :  Etoposide.  To help prevent nausea and vomiting after your treatment, we encourage you to take your nausea medication as instructed by your physician.   If you develop nausea and vomiting that is not controlled by your nausea medication, call the clinic.   BELOW ARE SYMPTOMS THAT SHOULD BE REPORTED IMMEDIATELY:  *FEVER GREATER THAN 100.5 F  *CHILLS WITH OR WITHOUT FEVER  NAUSEA AND VOMITING THAT IS NOT CONTROLLED WITH YOUR NAUSEA MEDICATION  *UNUSUAL SHORTNESS OF BREATH  *UNUSUAL BRUISING OR BLEEDING  TENDERNESS IN MOUTH AND THROAT WITH OR WITHOUT PRESENCE OF ULCERS  *URINARY PROBLEMS  *BOWEL PROBLEMS  UNUSUAL RASH Items with * indicate a potential emergency and should be followed up as soon as possible.  Feel free to call the clinic you have any questions or concerns. The clinic phone number is [phone].

## 2013-12-13 ENCOUNTER — Ambulatory Visit (HOSPITAL_BASED_OUTPATIENT_CLINIC_OR_DEPARTMENT_OTHER): Payer: Medicare Other

## 2013-12-13 DIAGNOSIS — C3410 Malignant neoplasm of upper lobe, unspecified bronchus or lung: Secondary | ICD-10-CM

## 2013-12-13 DIAGNOSIS — Z5111 Encounter for antineoplastic chemotherapy: Secondary | ICD-10-CM

## 2013-12-13 DIAGNOSIS — C3491 Malignant neoplasm of unspecified part of right bronchus or lung: Secondary | ICD-10-CM

## 2013-12-13 MED ORDER — DEXAMETHASONE SODIUM PHOSPHATE 10 MG/ML IJ SOLN
INTRAMUSCULAR | Status: AC
  Filled 2013-12-13: qty 1

## 2013-12-13 MED ORDER — SODIUM CHLORIDE 0.9 % IV SOLN
120.0000 mg/m2 | Freq: Once | INTRAVENOUS | Status: AC
  Administered 2013-12-13: 190 mg via INTRAVENOUS
  Filled 2013-12-13: qty 9.5

## 2013-12-13 MED ORDER — ONDANSETRON 8 MG/NS 50 ML IVPB
INTRAVENOUS | Status: AC
  Filled 2013-12-13: qty 8

## 2013-12-13 MED ORDER — ONDANSETRON 8 MG/50ML IVPB (CHCC)
8.0000 mg | Freq: Once | INTRAVENOUS | Status: AC
  Administered 2013-12-13: 8 mg via INTRAVENOUS

## 2013-12-13 MED ORDER — SODIUM CHLORIDE 0.9 % IV SOLN: Freq: Once | INTRAVENOUS | Status: DC

## 2013-12-13 MED ORDER — DEXAMETHASONE SODIUM PHOSPHATE 10 MG/ML IJ SOLN
10.0000 mg | Freq: Once | INTRAMUSCULAR | Status: AC
  Administered 2013-12-13: 10 mg via INTRAVENOUS

## 2013-12-13 NOTE — Patient Instructions (Signed)
[facility] Discharge Instructions for Patients Receiving Chemotherapy  Today you received the following chemotherapy agents: VP16 (Etoposide)  To help prevent nausea and vomiting after your treatment, we encourage you to take your nausea medication: Compazine 10mg  every 6 hours as  Needed.   If you develop nausea and vomiting that is not controlled by your nausea medication, call the clinic.   BELOW ARE SYMPTOMS THAT SHOULD BE REPORTED IMMEDIATELY:  *FEVER GREATER THAN 100.5 F  *CHILLS WITH OR WITHOUT FEVER  NAUSEA AND VOMITING THAT IS NOT CONTROLLED WITH YOUR NAUSEA MEDICATION  *UNUSUAL SHORTNESS OF BREATH  *UNUSUAL BRUISING OR BLEEDING  TENDERNESS IN MOUTH AND THROAT WITH OR WITHOUT PRESENCE OF ULCERS  *URINARY PROBLEMS  *BOWEL PROBLEMS  UNUSUAL RASH Items with * indicate a potential emergency and should be followed up as soon as possible.  Feel free to call the clinic you have any questions or concerns. The clinic phone number is [phone].

## 2013-12-14 ENCOUNTER — Ambulatory Visit (HOSPITAL_BASED_OUTPATIENT_CLINIC_OR_DEPARTMENT_OTHER): Payer: Medicare Other

## 2013-12-14 VITALS — BP 128/58 | HR 67 | Temp 98.4°F

## 2013-12-14 DIAGNOSIS — C3410 Malignant neoplasm of upper lobe, unspecified bronchus or lung: Secondary | ICD-10-CM

## 2013-12-14 DIAGNOSIS — Z5189 Encounter for other specified aftercare: Secondary | ICD-10-CM

## 2013-12-14 DIAGNOSIS — C3491 Malignant neoplasm of unspecified part of right bronchus or lung: Secondary | ICD-10-CM

## 2013-12-14 MED ORDER — PEGFILGRASTIM INJECTION 6 MG/0.6ML
6.0000 mg | Freq: Once | SUBCUTANEOUS | Status: AC
  Administered 2013-12-14: 6 mg via SUBCUTANEOUS
  Filled 2013-12-14: qty 0.6

## 2013-12-18 ENCOUNTER — Other Ambulatory Visit (HOSPITAL_BASED_OUTPATIENT_CLINIC_OR_DEPARTMENT_OTHER): Payer: Medicare Other

## 2013-12-18 DIAGNOSIS — C3491 Malignant neoplasm of unspecified part of right bronchus or lung: Secondary | ICD-10-CM

## 2013-12-18 DIAGNOSIS — C3410 Malignant neoplasm of upper lobe, unspecified bronchus or lung: Secondary | ICD-10-CM

## 2013-12-18 LAB — COMPREHENSIVE METABOLIC PANEL (CC13)
ALT: 9 U/L (ref 0–55)
AST: 12 U/L (ref 5–34)
Albumin: 3.4 g/dL — ABNORMAL LOW (ref 3.5–5.0)
Alkaline Phosphatase: 122 U/L (ref 40–150)
Anion Gap: 8 mEq/L (ref 3–11)
BUN: 17.3 mg/dL (ref 7.0–26.0)
CO2: 24 mEq/L (ref 22–29)
Calcium: 9.8 mg/dL (ref 8.4–10.4)
Chloride: 106 mEq/L (ref 98–109)
Creatinine: 0.8 mg/dL (ref 0.6–1.1)
Glucose: 101 mg/dl (ref 70–140)
Potassium: 3.9 mEq/L (ref 3.5–5.1)
Sodium: 138 mEq/L (ref 136–145)
Total Bilirubin: 1.08 mg/dL (ref 0.20–1.20)
Total Protein: 6.8 g/dL (ref 6.4–8.3)

## 2013-12-18 LAB — CBC WITH DIFFERENTIAL/PLATELET
BASO%: 0.7 % (ref 0.0–2.0)
Basophils Absolute: 0.1 10*3/uL (ref 0.0–0.1)
EOS%: 0.1 % (ref 0.0–7.0)
Eosinophils Absolute: 0 10*3/uL (ref 0.0–0.5)
HCT: 29.3 % — ABNORMAL LOW (ref 34.8–46.6)
HGB: 9.5 g/dL — ABNORMAL LOW (ref 11.6–15.9)
LYMPH%: 5 % — ABNORMAL LOW (ref 14.0–49.7)
MCH: 29.4 pg (ref 25.1–34.0)
MCHC: 32.6 g/dL (ref 31.5–36.0)
MCV: 90.3 fL (ref 79.5–101.0)
MONO#: 0.1 10*3/uL (ref 0.1–0.9)
MONO%: 1.2 % (ref 0.0–14.0)
NEUT#: 8.4 10*3/uL — ABNORMAL HIGH (ref 1.5–6.5)
NEUT%: 93 % — ABNORMAL HIGH (ref 38.4–76.8)
Platelets: 145 10*3/uL (ref 145–400)
RBC: 3.24 10*6/uL — ABNORMAL LOW (ref 3.70–5.45)
RDW: 15 % — ABNORMAL HIGH (ref 11.2–14.5)
WBC: 9 10*3/uL (ref 3.9–10.3)
lymph#: 0.5 10*3/uL — ABNORMAL LOW (ref 0.9–3.3)

## 2013-12-25 ENCOUNTER — Other Ambulatory Visit (HOSPITAL_BASED_OUTPATIENT_CLINIC_OR_DEPARTMENT_OTHER): Payer: Medicare Other

## 2013-12-25 DIAGNOSIS — C3410 Malignant neoplasm of upper lobe, unspecified bronchus or lung: Secondary | ICD-10-CM

## 2013-12-25 DIAGNOSIS — C3491 Malignant neoplasm of unspecified part of right bronchus or lung: Secondary | ICD-10-CM

## 2013-12-25 LAB — COMPREHENSIVE METABOLIC PANEL (CC13)
ALT: 9 U/L (ref 0–55)
AST: 15 U/L (ref 5–34)
Albumin: 3.4 g/dL — ABNORMAL LOW (ref 3.5–5.0)
Alkaline Phosphatase: 119 U/L (ref 40–150)
Anion Gap: 6 meq/L (ref 3–11)
BUN: 7.5 mg/dL (ref 7.0–26.0)
CO2: 29 meq/L (ref 22–29)
Calcium: 10.2 mg/dL (ref 8.4–10.4)
Chloride: 106 meq/L (ref 98–109)
Creatinine: 0.8 mg/dL (ref 0.6–1.1)
Glucose: 100 mg/dL (ref 70–140)
Potassium: 4 meq/L (ref 3.5–5.1)
Sodium: 140 meq/L (ref 136–145)
Total Bilirubin: 0.26 mg/dL (ref 0.20–1.20)
Total Protein: 6.6 g/dL (ref 6.4–8.3)

## 2013-12-25 LAB — CBC WITH DIFFERENTIAL/PLATELET
BASO%: 0.2 % (ref 0.0–2.0)
Basophils Absolute: 0 10*3/uL (ref 0.0–0.1)
EOS%: 0.2 % (ref 0.0–7.0)
Eosinophils Absolute: 0 10*3/uL (ref 0.0–0.5)
HCT: 24.8 % — ABNORMAL LOW (ref 34.8–46.6)
HGB: 8.1 g/dL — ABNORMAL LOW (ref 11.6–15.9)
LYMPH%: 11.7 % — ABNORMAL LOW (ref 14.0–49.7)
MCH: 29.2 pg (ref 25.1–34.0)
MCHC: 32.7 g/dL (ref 31.5–36.0)
MCV: 89.4 fL (ref 79.5–101.0)
MONO#: 1.1 10*3/uL — ABNORMAL HIGH (ref 0.1–0.9)
MONO%: 18.9 % — ABNORMAL HIGH (ref 0.0–14.0)
NEUT#: 4 10*3/uL (ref 1.5–6.5)
NEUT%: 69 % (ref 38.4–76.8)
Platelets: 12 10*3/uL — ABNORMAL LOW (ref 145–400)
RBC: 2.77 10*6/uL — ABNORMAL LOW (ref 3.70–5.45)
RDW: 15.2 % — ABNORMAL HIGH (ref 11.2–14.5)
WBC: 5.8 10*3/uL (ref 3.9–10.3)
lymph#: 0.7 10*3/uL — ABNORMAL LOW (ref 0.9–3.3)

## 2013-12-25 NOTE — Progress Notes (Signed)
Quick Note:  Call patient with the result and give bleeding precautions. ______

## 2013-12-26 ENCOUNTER — Telehealth: Payer: Self-pay | Admitting: Medical Oncology

## 2013-12-26 NOTE — Telephone Encounter (Signed)
Message copied by Ardeen Garland on Tue Dec 26, 2013  2:14 PM ------      Message from: Curt Bears      Created: Mon Dec 25, 2013  6:18 PM       Call patient with the result and give bleeding precautions. ------

## 2013-12-26 NOTE — Telephone Encounter (Signed)
I reviewed bleeding precautions with pt and she voiced understanding .

## 2014-01-01 ENCOUNTER — Telehealth: Payer: Self-pay | Admitting: Internal Medicine

## 2014-01-01 ENCOUNTER — Ambulatory Visit: Payer: Medicare Other

## 2014-01-01 ENCOUNTER — Encounter: Payer: Self-pay | Admitting: Internal Medicine

## 2014-01-01 ENCOUNTER — Other Ambulatory Visit (HOSPITAL_BASED_OUTPATIENT_CLINIC_OR_DEPARTMENT_OTHER): Payer: Medicare Other

## 2014-01-01 ENCOUNTER — Ambulatory Visit (HOSPITAL_BASED_OUTPATIENT_CLINIC_OR_DEPARTMENT_OTHER): Payer: Medicare Other | Admitting: Internal Medicine

## 2014-01-01 VITALS — BP 139/69 | HR 75 | Temp 97.7°F | Resp 18 | Ht 62.0 in | Wt 129.8 lb

## 2014-01-01 DIAGNOSIS — C3411 Malignant neoplasm of upper lobe, right bronchus or lung: Secondary | ICD-10-CM

## 2014-01-01 DIAGNOSIS — C3491 Malignant neoplasm of unspecified part of right bronchus or lung: Secondary | ICD-10-CM

## 2014-01-01 LAB — COMPREHENSIVE METABOLIC PANEL (CC13)
ALT: 17 U/L (ref 0–55)
AST: 17 U/L (ref 5–34)
Albumin: 3.8 g/dL (ref 3.5–5.0)
Alkaline Phosphatase: 115 U/L (ref 40–150)
Anion Gap: 10 mEq/L (ref 3–11)
BUN: 9.7 mg/dL (ref 7.0–26.0)
CO2: 23 mEq/L (ref 22–29)
Calcium: 10.2 mg/dL (ref 8.4–10.4)
Chloride: 107 mEq/L (ref 98–109)
Creatinine: 0.8 mg/dL (ref 0.6–1.1)
Glucose: 82 mg/dl (ref 70–140)
Potassium: 3.9 mEq/L (ref 3.5–5.1)
Sodium: 139 mEq/L (ref 136–145)
Total Bilirubin: 0.32 mg/dL (ref 0.20–1.20)
Total Protein: 7.4 g/dL (ref 6.4–8.3)

## 2014-01-01 LAB — CBC WITH DIFFERENTIAL/PLATELET
BASO%: 0.2 % (ref 0.0–2.0)
Basophils Absolute: 0 10*3/uL (ref 0.0–0.1)
EOS%: 0.2 % (ref 0.0–7.0)
Eosinophils Absolute: 0 10*3/uL (ref 0.0–0.5)
HCT: 26.5 % — ABNORMAL LOW (ref 34.8–46.6)
HGB: 8.7 g/dL — ABNORMAL LOW (ref 11.6–15.9)
LYMPH%: 12.3 % — ABNORMAL LOW (ref 14.0–49.7)
MCH: 29.1 pg (ref 25.1–34.0)
MCHC: 32.8 g/dL (ref 31.5–36.0)
MCV: 88.6 fL (ref 79.5–101.0)
MONO#: 1.1 10*3/uL — ABNORMAL HIGH (ref 0.1–0.9)
MONO%: 13 % (ref 0.0–14.0)
NEUT#: 6.5 10*3/uL (ref 1.5–6.5)
NEUT%: 74.3 % (ref 38.4–76.8)
Platelets: 61 10*3/uL — ABNORMAL LOW (ref 145–400)
RBC: 2.99 10*6/uL — ABNORMAL LOW (ref 3.70–5.45)
RDW: 16.5 % — ABNORMAL HIGH (ref 11.2–14.5)
WBC: 8.7 10*3/uL (ref 3.9–10.3)
lymph#: 1.1 10*3/uL (ref 0.9–3.3)
nRBC: 0 % (ref 0–0)

## 2014-01-01 NOTE — Telephone Encounter (Signed)
gv and printed appt sched and avs fo rpt for OCT...sed added tx.

## 2014-01-01 NOTE — Progress Notes (Signed)
[facility] Telephone:([phone]   Fax:(336) [place], [place] [address]  PRINCIPAL DIAGNOSIS: recurrent small cell lung cancer initially diagnosed as Limited stage small cell lung cancer in June 2011.   PRIOR THERAPY:  1. Status post 4 cycles of systemic chemotherapy with carboplatin and etoposide. Last dose was given 11/14/2009. This was concurrent with radiotherapy. 2. Status post prophylactic cranial irradiation completed January 28, 2010. 3. Status post wedge resection of the right upper lobe lung nodule under the care of Dr. Cyndia Bent on 09/21/2013 and the final pathology was consistent with small cell lung cancer.  CURRENT THERAPY: systemic chemotherapy with carboplatin for AUC of 5 on day 1 and etoposide 120 mg/M2 on days 1, 2 and 3 with Neulasta support on day 4 every 3 weeks. Status post 3 cycles. First dose on 10/30/2013.  INTERVAL HISTORY: Claudia [name] 68 y.o. female returns to the clinic today for followup visit. The patient is feeling fine today with no specific complaints. She tolerated the last cycle of her systemic chemotherapy with carboplatin and etoposide fairly well with no significant complaints except for thrombocytopenia. She denied having any bleeding, bruises or ecchymosis. The patient denied having any significant nausea or vomiting, no fever or chills. She denied having any significant shortness of breath, cough or hemoptysis. She denied having any significant weight loss or night sweats.  MEDICAL HISTORY: Past Medical History  Diagnosis Date  . COPD (chronic obstructive pulmonary disease)   . Anemia   . Bradycardia     mild,may be due to beta blocker therapy  . Lymphocytic colitis   . Lung cancer 2010    Dr. Julien Nordmann, finished chemo, sp radiation, left upper   . GERD (gastroesophageal reflux disease)     otc  . Arthritis   . Pneumonia     ALLERGIES:  is  allergic to azithromycin and tussionex pennkinetic er.  MEDICATIONS:  Current Outpatient Prescriptions  Medication Sig Dispense Refill  . amoxicillin (AMOXIL) 500 MG tablet Take 1 tablet (500 mg total) by mouth 2 (two) times daily.  14 tablet  0  . aspirin EC 81 MG tablet Take 81 mg by mouth. Couple times a week      . fluticasone (FLONASE) 50 MCG/ACT nasal spray Place 2 sprays into both nostrils daily.  16 g  4  . Fluticasone Furoate-Vilanterol (BREO ELLIPTA) 100-25 MCG/INH AEPB Inhale 1 puff into the lungs daily.  1 each  5  . loratadine (CLARITIN) 10 MG tablet Take 1 tablet (10 mg total) by mouth daily.      Marland Kitchen omeprazole (PRILOSEC) 20 MG capsule Take 1 capsule (20 mg total) by mouth daily.  30 capsule  11  . Probiotic Product (PROBIOTIC DAILY PO) Take 1 tablet by mouth daily.       . prochlorperazine (COMPAZINE) 10 MG tablet Take 1 tablet (10 mg total) by mouth every 6 (six) hours as needed for nausea or vomiting.  30 tablet  0   No current facility-administered medications for this visit.    SURGICAL HISTORY:  Past Surgical History  Procedure Laterality Date  . Neck surgery  1980's  . Dilation and curettage of uterus    . Uterine fibroid surgery  2012  . Bronchoscopy  2011  . Thoracotomy Right 09/21/2013    Procedure: THORACOTOMY MAJOR;  Surgeon: Gaye Pollack, MD;  Location: [facility];  Service: Thoracic;  Laterality:  Right;  . Wedge resection Right 09/21/2013    Procedure: RIGHT UPPER LOBE WEDGE RESECTION;  Surgeon: Gaye Pollack, MD;  Location: [facility] OR;  Service: Thoracic;  Laterality: Right;    REVIEW OF SYSTEMS:  Constitutional: negative Eyes: negative Ears, nose, mouth, throat, and face: negative Respiratory: positive for pleurisy/chest pain Cardiovascular: negative Gastrointestinal: negative Genitourinary:negative Integument/breast: negative Hematologic/lymphatic: negative Musculoskeletal:negative Neurological: negative Behavioral/Psych: negative Endocrine:  negative Allergic/Immunologic: negative   PHYSICAL EXAMINATION: General appearance: alert, cooperative and no distress Head: Normocephalic, without obvious abnormality, atraumatic Neck: no adenopathy Lymph nodes: Cervical, supraclavicular, and axillary nodes normal. Resp: clear to auscultation bilaterally Back: symmetric, no curvature. ROM normal. No CVA tenderness. Cardio: regular rate and rhythm, S1, S2 normal, no murmur, click, rub or gallop GI: soft, non-tender; bowel sounds normal; no masses,  no organomegaly Extremities: extremities normal, atraumatic, no cyanosis or edema Neurologic: Alert and oriented X 3, normal strength and tone. Normal symmetric reflexes. Normal coordination and gait  ECOG PERFORMANCE STATUS: 0 - Asymptomatic  Blood pressure 139/69, pulse 75, temperature 97.7 F (36.5 C), temperature source Oral, resp. rate 18, height 5\' 2"  (1.575 m), weight 129 lb 12.8 oz (58.877 kg).  LABORATORY DATA: Lab Results  Component Value Date   WBC 8.7 01/01/2014   HGB 8.7* 01/01/2014   HCT 26.5* 01/01/2014   MCV 88.6 01/01/2014   PLT 61* 01/01/2014      Chemistry      Component Value Date/Time   NA 140 12/25/2013 1105   NA 137 11/06/2013 0906   NA 140 08/05/2011 0823   K 4.0 12/25/2013 1105   K 4.2 11/06/2013 0906   K 4.2 08/05/2011 0823   CL 102 11/06/2013 0906   CL 103 08/03/2012 0842   CL 98 08/05/2011 0823   CO2 29 12/25/2013 1105   CO2 27 11/06/2013 0906   CO2 30 08/05/2011 0823   BUN 7.5 12/25/2013 1105   BUN 15 11/06/2013 0906   BUN 13 08/05/2011 0823   CREATININE 0.8 12/25/2013 1105   CREATININE 0.76 11/06/2013 0906   CREATININE 1.0 08/05/2011 0823      Component Value Date/Time   CALCIUM 10.2 12/25/2013 1105   CALCIUM 10.4 11/06/2013 0906   CALCIUM 9.4 08/05/2011 0823   ALKPHOS 119 12/25/2013 1105   ALKPHOS 112 11/06/2013 0906   ALKPHOS 69 08/05/2011 0823   AST 15 12/25/2013 1105   AST 13 11/06/2013 0906   AST 19 08/05/2011 0823   ALT 9 12/25/2013 1105   ALT 11 11/06/2013 0906   ALT  16 08/05/2011 0823   BILITOT 0.26 12/25/2013 1105   BILITOT 1.3* 11/06/2013 0906   BILITOT 0.70 08/05/2011 0823       RADIOGRAPHIC STUDIES:  ASSESSMENT AND PLAN: This is a very pleasant 68 years old white female with limited stage small cell lung cancer diagnosed in June of 2011 status post 4 cycles of systemic chemotherapy with carboplatin and etoposide concurrent with radiation followed by prophylactic cranial irradiation and has been observation since November 2007. She recently underwent right upper lobe wedge resection of the pulmonary nodules and the final pathology was consistent with recurrent small cell lung cancer. The patient was started on systemic chemotherapy with carboplatin and etoposide status post 3 cycles and tolerating her treatment fairly well except for thrombocytopenia. Her platelets count are low today. I recommended for her to delay the start of cycle #4 by 1 week to give her more time for recovery of the platelets count. She would come back  for followup visit in one month after repeating CT scan of the chest for restaging of her disease. She was advised to call immediately if she has any concerning symptoms in the interval  All questions were answered. The patient knows to call the clinic with any problems, questions or concerns. We can certainly see the patient much sooner if necessary.  Disclaimer: This note was dictated with voice recognition software. Similar sounding words can inadvertently be transcribed and may not be corrected upon review.

## 2014-01-02 ENCOUNTER — Ambulatory Visit: Payer: Medicare Other

## 2014-01-03 ENCOUNTER — Ambulatory Visit: Payer: Medicare Other

## 2014-01-04 ENCOUNTER — Ambulatory Visit: Payer: Medicare Other

## 2014-01-08 ENCOUNTER — Ambulatory Visit (HOSPITAL_BASED_OUTPATIENT_CLINIC_OR_DEPARTMENT_OTHER): Payer: Medicare Other

## 2014-01-08 ENCOUNTER — Other Ambulatory Visit (HOSPITAL_BASED_OUTPATIENT_CLINIC_OR_DEPARTMENT_OTHER): Payer: Medicare Other

## 2014-01-08 VITALS — BP 117/54 | HR 68 | Temp 98.5°F

## 2014-01-08 DIAGNOSIS — C3491 Malignant neoplasm of unspecified part of right bronchus or lung: Secondary | ICD-10-CM

## 2014-01-08 DIAGNOSIS — Z5111 Encounter for antineoplastic chemotherapy: Secondary | ICD-10-CM

## 2014-01-08 DIAGNOSIS — C3411 Malignant neoplasm of upper lobe, right bronchus or lung: Secondary | ICD-10-CM

## 2014-01-08 LAB — CBC WITH DIFFERENTIAL/PLATELET
BASO%: 1.1 % (ref 0.0–2.0)
Basophils Absolute: 0.1 10*3/uL (ref 0.0–0.1)
EOS%: 0.3 % (ref 0.0–7.0)
Eosinophils Absolute: 0 10*3/uL (ref 0.0–0.5)
HCT: 29.9 % — ABNORMAL LOW (ref 34.8–46.6)
HGB: 9.7 g/dL — ABNORMAL LOW (ref 11.6–15.9)
LYMPH%: 10.7 % — ABNORMAL LOW (ref 14.0–49.7)
MCH: 29.8 pg (ref 25.1–34.0)
MCHC: 32.4 g/dL (ref 31.5–36.0)
MCV: 91.8 fL (ref 79.5–101.0)
MONO#: 0.9 10*3/uL (ref 0.1–0.9)
MONO%: 17.8 % — ABNORMAL HIGH (ref 0.0–14.0)
NEUT#: 3.4 10*3/uL (ref 1.5–6.5)
NEUT%: 70.1 % (ref 38.4–76.8)
Platelets: 239 10*3/uL (ref 145–400)
RBC: 3.26 10*6/uL — ABNORMAL LOW (ref 3.70–5.45)
RDW: 19.7 % — ABNORMAL HIGH (ref 11.2–14.5)
WBC: 4.8 10*3/uL (ref 3.9–10.3)
lymph#: 0.5 10*3/uL — ABNORMAL LOW (ref 0.9–3.3)

## 2014-01-08 LAB — COMPREHENSIVE METABOLIC PANEL (CC13)
ALT: 11 U/L (ref 0–55)
AST: 14 U/L (ref 5–34)
Albumin: 3.8 g/dL (ref 3.5–5.0)
Alkaline Phosphatase: 94 U/L (ref 40–150)
Anion Gap: 9 mEq/L (ref 3–11)
BUN: 8.5 mg/dL (ref 7.0–26.0)
CO2: 25 mEq/L (ref 22–29)
Calcium: 10.5 mg/dL — ABNORMAL HIGH (ref 8.4–10.4)
Chloride: 106 mEq/L (ref 98–109)
Creatinine: 0.9 mg/dL (ref 0.6–1.1)
Glucose: 103 mg/dl (ref 70–140)
Potassium: 3.9 mEq/L (ref 3.5–5.1)
Sodium: 140 mEq/L (ref 136–145)
Total Bilirubin: 0.34 mg/dL (ref 0.20–1.20)
Total Protein: 7.3 g/dL (ref 6.4–8.3)

## 2014-01-08 MED ORDER — SODIUM CHLORIDE 0.9 % IV SOLN
Freq: Once | INTRAVENOUS | Status: AC
  Administered 2014-01-08: 15:00:00 via INTRAVENOUS

## 2014-01-08 MED ORDER — ETOPOSIDE CHEMO INJECTION 1 GM/50ML
120.0000 mg/m2 | Freq: Once | INTRAVENOUS | Status: AC
  Administered 2014-01-08: 190 mg via INTRAVENOUS
  Filled 2014-01-08: qty 9.5

## 2014-01-08 MED ORDER — ONDANSETRON 16 MG/50ML IVPB (CHCC)
16.0000 mg | Freq: Once | INTRAVENOUS | Status: AC
  Administered 2014-01-08: 16 mg via INTRAVENOUS

## 2014-01-08 MED ORDER — ONDANSETRON 16 MG/50ML IVPB (CHCC)
INTRAVENOUS | Status: AC
Start: 2014-01-08 — End: 2014-01-08
  Filled 2014-01-08: qty 16

## 2014-01-08 MED ORDER — DEXAMETHASONE SODIUM PHOSPHATE 20 MG/5ML IJ SOLN
20.0000 mg | Freq: Once | INTRAMUSCULAR | Status: AC
  Administered 2014-01-08: 20 mg via INTRAVENOUS

## 2014-01-08 MED ORDER — SODIUM CHLORIDE 0.9 % IV SOLN
380.5000 mg | Freq: Once | INTRAVENOUS | Status: AC
  Administered 2014-01-08: 380 mg via INTRAVENOUS
  Filled 2014-01-08: qty 38

## 2014-01-08 MED ORDER — CARBOPLATIN CHEMO INTRADERMAL TEST DOSE 100MCG/0.02ML
100.0000 ug | Freq: Once | INTRADERMAL | Status: AC
  Administered 2014-01-08: 100 ug via INTRADERMAL
  Filled 2014-01-08: qty 0.01

## 2014-01-08 MED ORDER — DEXAMETHASONE SODIUM PHOSPHATE 20 MG/5ML IJ SOLN
INTRAMUSCULAR | Status: AC
  Filled 2014-01-08: qty 5

## 2014-01-08 NOTE — Patient Instructions (Signed)
[facility] Discharge Instructions for Patients Receiving Chemotherapy  Today you received the following chemotherapy agents: Carboplatin and Etoposide  To help prevent nausea and vomiting after your treatment, we encourage you to take your nausea medication: compazine 10mg  every 6 hours as needed.   If you develop nausea and vomiting that is not controlled by your nausea medication, call the clinic.   BELOW ARE SYMPTOMS THAT SHOULD BE REPORTED IMMEDIATELY:  *FEVER GREATER THAN 100.5 F  *CHILLS WITH OR WITHOUT FEVER  NAUSEA AND VOMITING THAT IS NOT CONTROLLED WITH YOUR NAUSEA MEDICATION  *UNUSUAL SHORTNESS OF BREATH  *UNUSUAL BRUISING OR BLEEDING  TENDERNESS IN MOUTH AND THROAT WITH OR WITHOUT PRESENCE OF ULCERS  *URINARY PROBLEMS  *BOWEL PROBLEMS  UNUSUAL RASH Items with * indicate a potential emergency and should be followed up as soon as possible.  Feel free to call the clinic you have any questions or concerns. The clinic phone number is [phone].

## 2014-01-09 ENCOUNTER — Ambulatory Visit (HOSPITAL_BASED_OUTPATIENT_CLINIC_OR_DEPARTMENT_OTHER): Payer: Medicare Other

## 2014-01-09 VITALS — BP 131/54 | HR 85 | Temp 98.1°F

## 2014-01-09 DIAGNOSIS — Z5111 Encounter for antineoplastic chemotherapy: Secondary | ICD-10-CM

## 2014-01-09 DIAGNOSIS — C3491 Malignant neoplasm of unspecified part of right bronchus or lung: Secondary | ICD-10-CM

## 2014-01-09 DIAGNOSIS — C3411 Malignant neoplasm of upper lobe, right bronchus or lung: Secondary | ICD-10-CM

## 2014-01-09 MED ORDER — DEXAMETHASONE SODIUM PHOSPHATE 10 MG/ML IJ SOLN
10.0000 mg | Freq: Once | INTRAMUSCULAR | Status: AC
  Administered 2014-01-09: 10 mg via INTRAVENOUS

## 2014-01-09 MED ORDER — SODIUM CHLORIDE 0.9 % IV SOLN
120.0000 mg/m2 | Freq: Once | INTRAVENOUS | Status: AC
  Administered 2014-01-09: 190 mg via INTRAVENOUS
  Filled 2014-01-09: qty 9.5

## 2014-01-09 MED ORDER — ONDANSETRON 8 MG/50ML IVPB (CHCC)
8.0000 mg | Freq: Once | INTRAVENOUS | Status: AC
  Administered 2014-01-09: 8 mg via INTRAVENOUS

## 2014-01-09 MED ORDER — DEXAMETHASONE SODIUM PHOSPHATE 10 MG/ML IJ SOLN
INTRAMUSCULAR | Status: AC
  Filled 2014-01-09: qty 1

## 2014-01-09 MED ORDER — ONDANSETRON 8 MG/NS 50 ML IVPB
INTRAVENOUS | Status: AC
  Filled 2014-01-09: qty 8

## 2014-01-09 MED ORDER — SODIUM CHLORIDE 0.9 % IV SOLN
Freq: Once | INTRAVENOUS | Status: AC
  Administered 2014-01-09: 14:00:00 via INTRAVENOUS

## 2014-01-09 NOTE — Progress Notes (Signed)
Very difficult venous access-took total of #4 sticks for adequate access today. Discussed PAC-Trena is not interested.

## 2014-01-09 NOTE — Patient Instructions (Signed)
[facility] Discharge Instructions for Patients Receiving Chemotherapy  Today you received the following chemotherapy agent: Etoposide  To help prevent nausea and vomiting after your treatment, we encourage you to take your nausea medication as directed:  Compazine 10 mg every 6 hours as needed If you develop nausea and vomiting that is not controlled by your nausea medication, call the clinic.   BELOW ARE SYMPTOMS THAT SHOULD BE REPORTED IMMEDIATELY:  *FEVER GREATER THAN 100.5 F  *CHILLS WITH OR WITHOUT FEVER  NAUSEA AND VOMITING THAT IS NOT CONTROLLED WITH YOUR NAUSEA MEDICATION  *UNUSUAL SHORTNESS OF BREATH  *UNUSUAL BRUISING OR BLEEDING  TENDERNESS IN MOUTH AND THROAT WITH OR WITHOUT PRESENCE OF ULCERS  *URINARY PROBLEMS  *BOWEL PROBLEMS  UNUSUAL RASH Items with * indicate a potential emergency and should be followed up as soon as possible.  Feel free to call the clinic should you have any questions or concerns. The clinic phone number is [phone].  It has been a pleasure to serve you today!

## 2014-01-10 ENCOUNTER — Ambulatory Visit (HOSPITAL_BASED_OUTPATIENT_CLINIC_OR_DEPARTMENT_OTHER): Payer: Medicare Other

## 2014-01-10 VITALS — BP 137/58 | HR 74 | Temp 97.9°F | Resp 18

## 2014-01-10 DIAGNOSIS — C3491 Malignant neoplasm of unspecified part of right bronchus or lung: Secondary | ICD-10-CM

## 2014-01-10 DIAGNOSIS — C3411 Malignant neoplasm of upper lobe, right bronchus or lung: Secondary | ICD-10-CM

## 2014-01-10 DIAGNOSIS — Z5111 Encounter for antineoplastic chemotherapy: Secondary | ICD-10-CM

## 2014-01-10 MED ORDER — SODIUM CHLORIDE 0.9 % IV SOLN
120.0000 mg/m2 | Freq: Once | INTRAVENOUS | Status: AC
  Administered 2014-01-10: 190 mg via INTRAVENOUS
  Filled 2014-01-10: qty 9.5

## 2014-01-10 MED ORDER — DEXAMETHASONE SODIUM PHOSPHATE 10 MG/ML IJ SOLN
10.0000 mg | Freq: Once | INTRAMUSCULAR | Status: AC
  Administered 2014-01-10: 10 mg via INTRAVENOUS

## 2014-01-10 MED ORDER — DEXAMETHASONE SODIUM PHOSPHATE 10 MG/ML IJ SOLN
INTRAMUSCULAR | Status: AC
  Filled 2014-01-10: qty 1

## 2014-01-10 MED ORDER — SODIUM CHLORIDE 0.9 % IV SOLN
Freq: Once | INTRAVENOUS | Status: AC
  Administered 2014-01-10: 12:00:00 via INTRAVENOUS

## 2014-01-10 MED ORDER — ONDANSETRON 8 MG/50ML IVPB (CHCC)
8.0000 mg | Freq: Once | INTRAVENOUS | Status: AC
  Administered 2014-01-10: 8 mg via INTRAVENOUS

## 2014-01-10 MED ORDER — ONDANSETRON 8 MG/NS 50 ML IVPB
INTRAVENOUS | Status: AC
  Filled 2014-01-10: qty 8

## 2014-01-10 NOTE — Patient Instructions (Signed)
[facility] Discharge Instructions for Patients Receiving Chemotherapy  Today you received the following chemotherapy agents Etoposide.  To help prevent nausea and vomiting after your treatment, we encourage you to take your nausea medication as prescribed.   If you develop nausea and vomiting that is not controlled by your nausea medication, call the clinic.   BELOW ARE SYMPTOMS THAT SHOULD BE REPORTED IMMEDIATELY:  *FEVER GREATER THAN 100.5 F  *CHILLS WITH OR WITHOUT FEVER  NAUSEA AND VOMITING THAT IS NOT CONTROLLED WITH YOUR NAUSEA MEDICATION  *UNUSUAL SHORTNESS OF BREATH  *UNUSUAL BRUISING OR BLEEDING  TENDERNESS IN MOUTH AND THROAT WITH OR WITHOUT PRESENCE OF ULCERS  *URINARY PROBLEMS  *BOWEL PROBLEMS  UNUSUAL RASH Items with * indicate a potential emergency and should be followed up as soon as possible.  Feel free to call the clinic you have any questions or concerns. The clinic phone number is [phone].

## 2014-01-11 ENCOUNTER — Ambulatory Visit (HOSPITAL_BASED_OUTPATIENT_CLINIC_OR_DEPARTMENT_OTHER): Payer: Medicare Other

## 2014-01-11 VITALS — BP 123/72 | HR 68 | Temp 97.9°F

## 2014-01-11 DIAGNOSIS — Z5189 Encounter for other specified aftercare: Secondary | ICD-10-CM

## 2014-01-11 DIAGNOSIS — C3491 Malignant neoplasm of unspecified part of right bronchus or lung: Secondary | ICD-10-CM

## 2014-01-11 DIAGNOSIS — C3411 Malignant neoplasm of upper lobe, right bronchus or lung: Secondary | ICD-10-CM

## 2014-01-11 MED ORDER — PEGFILGRASTIM INJECTION 6 MG/0.6ML
6.0000 mg | Freq: Once | SUBCUTANEOUS | Status: AC
  Administered 2014-01-11: 6 mg via SUBCUTANEOUS
  Filled 2014-01-11: qty 0.6

## 2014-01-11 NOTE — Patient Instructions (Signed)
Pegfilgrastim injection What is this medicine? PEGFILGRASTIM (peg fil GRA stim) is a long-acting granulocyte colony-stimulating factor that stimulates the growth of neutrophils, a type of white blood cell important in the body's fight against infection. It is used to reduce the incidence of fever and infection in patients with certain types of cancer who are receiving chemotherapy that affects the bone marrow. This medicine may be used for other purposes; ask your health care provider or pharmacist if you have questions. COMMON BRAND NAME(S): Neulasta What should I tell my health care provider before I take this medicine? They need to know if you have any of these conditions: -latex allergy -ongoing radiation therapy -sickle cell disease -skin reactions to acrylic adhesives (On-Body Injector only) -an unusual or allergic reaction to pegfilgrastim, filgrastim, other medicines, foods, dyes, or preservatives -pregnant or trying to get pregnant -breast-feeding How should I use this medicine? This medicine is for injection under the skin. If you get this medicine at home, you will be taught how to prepare and give the pre-filled syringe or how to use the On-body Injector. Refer to the patient Instructions for Use for detailed instructions. Use exactly as directed. Take your medicine at regular intervals. Do not take your medicine more often than directed. It is important that you put your used needles and syringes in a special sharps container. Do not put them in a trash can. If you do not have a sharps container, call your pharmacist or healthcare provider to get one. Talk to your pediatrician regarding the use of this medicine in children. Special care may be needed. Overdosage: If you think you have taken too much of this medicine contact a poison control center or emergency room at once. NOTE: This medicine is only for you. Do not share this medicine with others. What if I miss a dose? It is  important not to miss your dose. Call your doctor or health care professional if you miss your dose. If you miss a dose due to an On-body Injector failure or leakage, a new dose should be administered as soon as possible using a single prefilled syringe for manual use. What may interact with this medicine? Interactions have not been studied. Give your health care provider a list of all the medicines, herbs, non-prescription drugs, or dietary supplements you use. Also tell them if you smoke, drink alcohol, or use illegal drugs. Some items may interact with your medicine. This list may not describe all possible interactions. Give your health care provider a list of all the medicines, herbs, non-prescription drugs, or dietary supplements you use. Also tell them if you smoke, drink alcohol, or use illegal drugs. Some items may interact with your medicine. What should I watch for while using this medicine? You may need blood work done while you are taking this medicine. If you are going to need a MRI, CT scan, or other procedure, tell your doctor that you are using this medicine (On-Body Injector only). What side effects may I notice from receiving this medicine? Side effects that you should report to your doctor or health care professional as soon as possible: -allergic reactions like skin rash, itching or hives, swelling of the face, lips, or tongue -dizziness -fever -pain, redness, or irritation at site where injected -pinpoint red spots on the skin -shortness of breath or breathing problems -stomach or side pain, or pain at the shoulder -swelling -tiredness -trouble passing urine Side effects that usually do not require medical attention (report to your doctor   or health care professional if they continue or are bothersome): -bone pain -muscle pain This list may not describe all possible side effects. Call your doctor for medical advice about side effects. You may report side effects to FDA at  1-800-FDA-1088. Where should I keep my medicine? Keep out of the reach of children. Store pre-filled syringes in a refrigerator between 2 and 8 degrees C (36 and 46 degrees F). Do not freeze. Keep in carton to protect from light. Throw away this medicine if it is left out of the refrigerator for more than 48 hours. Throw away any unused medicine after the expiration date. NOTE: This sheet is a summary. It may not cover all possible information. If you have questions about this medicine, talk to your doctor, pharmacist, or health care provider.  2015, [employer]. (2013-06-15 16:14:05)  

## 2014-01-15 ENCOUNTER — Other Ambulatory Visit (HOSPITAL_BASED_OUTPATIENT_CLINIC_OR_DEPARTMENT_OTHER): Payer: Medicare Other

## 2014-01-15 DIAGNOSIS — C3491 Malignant neoplasm of unspecified part of right bronchus or lung: Secondary | ICD-10-CM

## 2014-01-15 DIAGNOSIS — C3411 Malignant neoplasm of upper lobe, right bronchus or lung: Secondary | ICD-10-CM

## 2014-01-15 LAB — CBC WITH DIFFERENTIAL/PLATELET
BASO%: 0.6 % (ref 0.0–2.0)
Basophils Absolute: 0 10*3/uL (ref 0.0–0.1)
EOS%: 0 % (ref 0.0–7.0)
Eosinophils Absolute: 0 10*3/uL (ref 0.0–0.5)
HCT: 25.7 % — ABNORMAL LOW (ref 34.8–46.6)
HGB: 8.3 g/dL — ABNORMAL LOW (ref 11.6–15.9)
LYMPH%: 5.8 % — ABNORMAL LOW (ref 14.0–49.7)
MCH: 30.3 pg (ref 25.1–34.0)
MCHC: 32.1 g/dL (ref 31.5–36.0)
MCV: 94.4 fL (ref 79.5–101.0)
MONO#: 0.1 10*3/uL (ref 0.1–0.9)
MONO%: 1.7 % (ref 0.0–14.0)
NEUT#: 6.8 10*3/uL — ABNORMAL HIGH (ref 1.5–6.5)
NEUT%: 91.9 % — ABNORMAL HIGH (ref 38.4–76.8)
Platelets: 102 10*3/uL — ABNORMAL LOW (ref 145–400)
RBC: 2.72 10*6/uL — ABNORMAL LOW (ref 3.70–5.45)
RDW: 20.5 % — ABNORMAL HIGH (ref 11.2–14.5)
WBC: 7.4 10*3/uL (ref 3.9–10.3)
lymph#: 0.4 10*3/uL — ABNORMAL LOW (ref 0.9–3.3)

## 2014-01-15 LAB — COMPREHENSIVE METABOLIC PANEL (CC13)
ALT: 14 U/L (ref 0–55)
AST: 14 U/L (ref 5–34)
Albumin: 3.5 g/dL (ref 3.5–5.0)
Alkaline Phosphatase: 127 U/L (ref 40–150)
Anion Gap: 6 mEq/L (ref 3–11)
BUN: 15.8 mg/dL (ref 7.0–26.0)
CO2: 27 mEq/L (ref 22–29)
Calcium: 10.5 mg/dL — ABNORMAL HIGH (ref 8.4–10.4)
Chloride: 106 mEq/L (ref 98–109)
Creatinine: 0.8 mg/dL (ref 0.6–1.1)
Glucose: 87 mg/dl (ref 70–140)
Potassium: 4.2 mEq/L (ref 3.5–5.1)
Sodium: 139 mEq/L (ref 136–145)
Total Bilirubin: 1.37 mg/dL — ABNORMAL HIGH (ref 0.20–1.20)
Total Protein: 6.6 g/dL (ref 6.4–8.3)

## 2014-01-22 ENCOUNTER — Other Ambulatory Visit: Payer: Self-pay | Admitting: Medical Oncology

## 2014-01-22 ENCOUNTER — Other Ambulatory Visit (HOSPITAL_BASED_OUTPATIENT_CLINIC_OR_DEPARTMENT_OTHER): Payer: Medicare Other

## 2014-01-22 ENCOUNTER — Telehealth: Payer: Self-pay | Admitting: Medical Oncology

## 2014-01-22 ENCOUNTER — Other Ambulatory Visit: Payer: Self-pay | Admitting: Nurse Practitioner

## 2014-01-22 ENCOUNTER — Ambulatory Visit: Payer: Medicare Other

## 2014-01-22 ENCOUNTER — Encounter (HOSPITAL_COMMUNITY): Payer: Self-pay

## 2014-01-22 ENCOUNTER — Other Ambulatory Visit: Payer: Self-pay | Admitting: *Deleted

## 2014-01-22 ENCOUNTER — Ambulatory Visit (HOSPITAL_COMMUNITY)
Admission: RE | Admit: 2014-01-22 | Discharge: 2014-01-22 | Disposition: A | Discharge status: Home / Self Care | Payer: Medicare Other | Source: Ambulatory Visit | Attending: Internal Medicine | Admitting: Internal Medicine

## 2014-01-22 ENCOUNTER — Encounter: Payer: Self-pay | Admitting: Nurse Practitioner

## 2014-01-22 ENCOUNTER — Ambulatory Visit (HOSPITAL_BASED_OUTPATIENT_CLINIC_OR_DEPARTMENT_OTHER): Payer: Medicare Other

## 2014-01-22 ENCOUNTER — Ambulatory Visit (HOSPITAL_BASED_OUTPATIENT_CLINIC_OR_DEPARTMENT_OTHER): Payer: Medicare Other | Admitting: Nurse Practitioner

## 2014-01-22 VITALS — BP 132/57 | HR 87 | Temp 99.2°F | Resp 18

## 2014-01-22 VITALS — BP 131/59 | HR 106 | Temp 98.3°F | Resp 18 | Ht 62.0 in | Wt 132.2 lb

## 2014-01-22 DIAGNOSIS — R918 Other nonspecific abnormal finding of lung field: Secondary | ICD-10-CM | POA: Diagnosis not present

## 2014-01-22 DIAGNOSIS — D6959 Other secondary thrombocytopenia: Secondary | ICD-10-CM | POA: Insufficient documentation

## 2014-01-22 DIAGNOSIS — C3491 Malignant neoplasm of unspecified part of right bronchus or lung: Secondary | ICD-10-CM

## 2014-01-22 DIAGNOSIS — C3490 Malignant neoplasm of unspecified part of unspecified bronchus or lung: Secondary | ICD-10-CM

## 2014-01-22 DIAGNOSIS — D61818 Other pancytopenia: Secondary | ICD-10-CM

## 2014-01-22 DIAGNOSIS — D696 Thrombocytopenia, unspecified: Secondary | ICD-10-CM

## 2014-01-22 DIAGNOSIS — D6481 Anemia due to antineoplastic chemotherapy: Secondary | ICD-10-CM | POA: Diagnosis present

## 2014-01-22 DIAGNOSIS — C3411 Malignant neoplasm of upper lobe, right bronchus or lung: Secondary | ICD-10-CM | POA: Insufficient documentation

## 2014-01-22 DIAGNOSIS — T451X5A Adverse effect of antineoplastic and immunosuppressive drugs, initial encounter: Secondary | ICD-10-CM | POA: Insufficient documentation

## 2014-01-22 LAB — COMPREHENSIVE METABOLIC PANEL (CC13)
ALT: 13 U/L (ref 0–55)
AST: 14 U/L (ref 5–34)
Albumin: 3.6 g/dL (ref 3.5–5.0)
Alkaline Phosphatase: 109 U/L (ref 40–150)
Anion Gap: 8 mEq/L (ref 3–11)
BUN: 8.8 mg/dL (ref 7.0–26.0)
CO2: 26 mEq/L (ref 22–29)
Calcium: 10.2 mg/dL (ref 8.4–10.4)
Chloride: 109 mEq/L (ref 98–109)
Creatinine: 0.9 mg/dL (ref 0.6–1.1)
Glucose: 93 mg/dl (ref 70–140)
Potassium: 3.8 mEq/L (ref 3.5–5.1)
Sodium: 143 mEq/L (ref 136–145)
Total Bilirubin: 0.28 mg/dL (ref 0.20–1.20)
Total Protein: 6.5 g/dL (ref 6.4–8.3)

## 2014-01-22 LAB — CBC WITH DIFFERENTIAL/PLATELET
BASO%: 0 % (ref 0.0–2.0)
Basophils Absolute: 0 10*3/uL (ref 0.0–0.1)
EOS%: 0.4 % (ref 0.0–7.0)
Eosinophils Absolute: 0 10*3/uL (ref 0.0–0.5)
HCT: 18.6 % — ABNORMAL LOW (ref 34.8–46.6)
HGB: 6.1 g/dL — CL (ref 11.6–15.9)
LYMPH%: 11.8 % — ABNORMAL LOW (ref 14.0–49.7)
MCH: 30.3 pg (ref 25.1–34.0)
MCHC: 32.8 g/dL (ref 31.5–36.0)
MCV: 92.5 fL (ref 79.5–101.0)
MONO#: 0.9 10*3/uL (ref 0.1–0.9)
MONO%: 16.5 % — ABNORMAL HIGH (ref 0.0–14.0)
NEUT#: 3.9 10*3/uL (ref 1.5–6.5)
NEUT%: 71.3 % (ref 38.4–76.8)
Platelets: 7 10*3/uL — CL (ref 145–400)
RBC: 2.01 10*6/uL — ABNORMAL LOW (ref 3.70–5.45)
RDW: 17.9 % — ABNORMAL HIGH (ref 11.2–14.5)
WBC: 5.5 10*3/uL (ref 3.9–10.3)
lymph#: 0.7 10*3/uL — ABNORMAL LOW (ref 0.9–3.3)
nRBC: 0 % (ref 0–0)

## 2014-01-22 LAB — PREPARE RBC (CROSSMATCH)

## 2014-01-22 MED ORDER — DIPHENHYDRAMINE HCL 25 MG PO CAPS
ORAL_CAPSULE | ORAL | Status: AC
  Filled 2014-01-22: qty 1

## 2014-01-22 MED ORDER — DIPHENHYDRAMINE HCL 25 MG PO CAPS
25.0000 mg | ORAL_CAPSULE | Freq: Once | ORAL | Status: AC
  Administered 2014-01-22: 25 mg via ORAL

## 2014-01-22 MED ORDER — ACETAMINOPHEN 325 MG PO TABS
ORAL_TABLET | ORAL | Status: AC
  Filled 2014-01-22: qty 2

## 2014-01-22 MED ORDER — IOHEXOL 300 MG/ML  SOLN
80.0000 mL | Freq: Once | INTRAMUSCULAR | Status: AC | PRN
  Administered 2014-01-22: 80 mL via INTRAVENOUS

## 2014-01-22 MED ORDER — SODIUM CHLORIDE 0.9 % IV SOLN
250.0000 mL | Freq: Once | INTRAVENOUS | Status: AC
  Administered 2014-01-22: 250 mL via INTRAVENOUS

## 2014-01-22 MED ORDER — ACETAMINOPHEN 325 MG PO TABS
650.0000 mg | ORAL_TABLET | Freq: Once | ORAL | Status: AC
  Administered 2014-01-22: 650 mg via ORAL

## 2014-01-22 NOTE — Patient Instructions (Signed)
Platelet Transfusion Information This is information about transfusions of platelets. Platelets are tiny cells made by the bone marrow and found in the blood. When a blood vessel is damaged, platelets rush to the damaged area to help form a clot. This begins the healing process. When platelets get very low, your blood may have trouble clotting. This may be from:  Illness.  Blood disorder.  Chemotherapy to treat cancer. Often, lower platelet counts do not cause problems.  Platelets usually last for 7 to 10 days. If they are not used in an injury, they are broken down by the liver or spleen. Symptoms of low platelet count include:  Nosebleeds.  Bleeding gums.  Heavy periods.  Bruising and tiny blood spots in the skin.  Pinpoint spots of bleeding (petechiae).  Larger bruises (purpura).  Bleeding can be more serious if it happens in the brain or bowel. Platelet transfusions are often used to keep the platelet count at an acceptable level. Serious bleeding due to low platelets is uncommon. RISKS AND COMPLICATIONS Severe side effects from platelet transfusions are uncommon. Minor reactions may include:  Itching.  Rashes.  High temperature and shivering. Medications are available to stop transfusion reactions. Let your health care provider know if you develop any of the above problems.  If you are having platelet transfusions frequently, they may get less effective. This is called becoming refractory to platelets. It is uncommon. This can happen from non-immune causes and immune causes. Non-immune causes include:  High temperatures.  Some medications.  An enlarged spleen. Immune causes happen when your body discovers the platelets are not your own and begins making antibodies against them. The antibodies kill the platelets quickly. Even with platelet transfusions, you may still notice problems with bleeding or bruising. Let your health care providers know about this. Other things  can be done to help if this happens.  BEFORE THE PROCEDURE   Your health care provider will check your platelet count regularly.  If the platelet count is too low, it may be necessary to have a platelet transfusion.  This is more important before certain procedures with a risk of bleeding, such as a spinal tap.  Platelet transfusion reduces the risk of bleeding during or after the procedure.  Except in emergencies, giving a transfusion requires a written consent. Before blood is taken from a donor, a complete history is taken to make sure the person has no history of previous diseases, nor engages in risky social behavior. Examples of this are intravenous drug use or sexual activity with multiple partners. This could lead to infected blood or blood products being used. This history is taken in spite of the extensive testing to make sure the blood is safe. All blood products transfused are tested to make sure it is a match for the person getting the blood. It is also checked for infections. Blood is the safest it has ever been. The risk of getting an infection is very low. PROCEDURE  The platelets are stored in small plastic bags that are kept at a low temperature.  Each bag is called a unit and sometimes two units are given. They are given through an intravenous line by drip infusion over about one-half hour.  Usually blood is collected from multiple people to get enough to transfuse.  Sometimes, the platelets are collected from a single person. This is done using a special machine that separates the platelets from the blood. The machine is called an apheresis machine. Platelets collected in this  way are called apheresed platelets. Apheresed platelets reduce the risk of becoming sensitive to the platelets. This lowers the chances of having a transfusion reaction.  As it only takes a short time to give the platelets, this treatment can be given in an outpatient department. Platelets can also be  given before or after other treatments. SEEK IMMEDIATE MEDICAL CARE IF: You have any of the following symptoms over the next 12 hours or several days:  Shaking chills.  Fever with a temperature greater than 102F (38.9C) develops.  Back pain or muscle pain.  People around you feel you are not acting correctly, or you are confused.  Blood in the urine or bowel movements, or bleeding from any place in your body.  Shortness of breath, or difficulty breathing.  Dizziness.  Fainting.  You break out in a rash or develop hives.  Decrease in the amount of urine you are putting out, or the urine turns a dark color or changes to pink, red, or brown.  A severe headache or stiff neck.  Bruising more easily. Document Released: 01/11/2007 Document Revised: 07/31/2013 Document Reviewed: 01/11/2007 [facility] Patient Information 2015 [place], [place]. This information is not intended to replace advice given to you by your health care provider. Make sure you discuss any questions you have with your health care provider.   Blood Transfusion Information WHAT IS A BLOOD TRANSFUSION? A transfusion is the replacement of blood or some of its parts. Blood is made up of multiple cells which provide different functions.  Red blood cells carry oxygen and are used for blood loss replacement.  White blood cells fight against infection.  Platelets control bleeding.  Plasma helps clot blood.  Other blood products are available for specialized needs, such as hemophilia or other clotting disorders. BEFORE THE TRANSFUSION  Who gives blood for transfusions?   You may be able to donate blood to be used at a later date on yourself (autologous donation).  Relatives can be asked to donate blood. This is generally not any safer than if you have received blood from a stranger. The same precautions are taken to ensure safety when a relative's blood is donated.  Healthy volunteers who are fully evaluated to  make sure their blood is safe. This is blood bank blood. Transfusion therapy is the safest it has ever been in the practice of medicine. Before blood is taken from a donor, a complete history is taken to make sure that person has no history of diseases nor engages in risky social behavior (examples are intravenous drug use or sexual activity with multiple partners). The donor's travel history is screened to minimize risk of transmitting infections, such as malaria. The donated blood is tested for signs of infectious diseases, such as HIV and hepatitis. The blood is then tested to be sure it is compatible with you in order to minimize the chance of a transfusion reaction. If you or a relative donates blood, this is often done in anticipation of surgery and is not appropriate for emergency situations. It takes many days to process the donated blood. RISKS AND COMPLICATIONS Although transfusion therapy is very safe and saves many lives, the main dangers of transfusion include:   Getting an infectious disease.  Developing a transfusion reaction. This is an allergic reaction to something in the blood you were given. Every precaution is taken to prevent this. The decision to have a blood transfusion has been considered carefully by your caregiver before blood is given. Blood is not given  unless the benefits outweigh the risks. AFTER THE TRANSFUSION  Right after receiving a blood transfusion, you will usually feel much better and more energetic. This is especially true if your red blood cells have gotten low (anemic). The transfusion raises the level of the red blood cells which carry oxygen, and this usually causes an energy increase.  The nurse administering the transfusion will monitor you carefully for complications. HOME CARE INSTRUCTIONS  No special instructions are needed after a transfusion. You may find your energy is better. Speak with your caregiver about any limitations on activity for underlying  diseases you may have. SEEK MEDICAL CARE IF:   Your condition is not improving after your transfusion.  You develop redness or irritation at the intravenous (IV) site. SEEK IMMEDIATE MEDICAL CARE IF:  Any of the following symptoms occur over the next 12 hours:  Shaking chills.  You have a temperature by mouth above 102 F (38.9 C), not controlled by medicine.  Chest, back, or muscle pain.  People around you feel you are not acting correctly or are confused.  Shortness of breath or difficulty breathing.  Dizziness and fainting.  You get a rash or develop hives.  You have a decrease in urine output.  Your urine turns a dark color or changes to pink, red, or brown. Any of the following symptoms occur over the next 10 days:  You have a temperature by mouth above 102 F (38.9 C), not controlled by medicine.  Shortness of breath.  Weakness after normal activity.  The white part of the eye turns yellow (jaundice).  You have a decrease in the amount of urine or are urinating less often.  Your urine turns a dark color or changes to pink, red, or brown. Document Released: 03/13/2000 Document Revised: 06/08/2011 Document Reviewed: 10/31/2007 [facility] Patient Information 2015 [place], [place]. This information is not intended to replace advice given to you by your health care provider. Make sure you discuss any questions you have with your health care provider.

## 2014-01-22 NOTE — Assessment & Plan Note (Signed)
Patient with marked chemotherapy-induced anemia and thrombocytopenia today.  Hemoglobin has decreased to 6.1; and platelet count has decreased to 7.  Patient has had some brief, mild epistaxis; and also has some increased bruising.  She denies any other known bleeding issues whatsoever.  Patient will receive a total of 2 units packed red blood cell transfusion and one unit of platelets.  She'll actually receive the platelets and one unit of the blood this afternoon; and will return for same in the morning to receive her second unit of blood.

## 2014-01-22 NOTE — Progress Notes (Signed)
SYMPTOM MANAGEMENT CLINIC   HPI: Claudia [name] 68 y.o. female diagnosed with lung cancer.  Currently undergoing carboplatin/etoposide chemotherapy regimen.   Patient presented to the [facility] today for routine lab draw.  She was noted to have marked anemia and thrombus cytopenia today.  Hemoglobin has dropped to 6.1 and platelet count dropped down to 7.  Patient denies any worsening issues with either fatigue or shortness of breath.  She does note some increased bruising; and has noted some occasional mild epistaxis.  She denies any other bleeding issues whatsoever.  She denies any other GI symptoms; or dysuria.  She denies any recent fevers or chills.   Patient obtained a restaging CT of the chest earlier this morning.  HPI  CURRENT THERAPY: Upcoming Treatment Dates - LUNG SMALL CELL Carboplatin D1 / Etoposide D1-3 q21d Days with orders from any treatment category:  No upcoming days in selected categories.    Review of Systems  Constitutional: Negative.   HENT: Negative.   Eyes: Negative.   Respiratory: Negative.   Cardiovascular: Negative.   Gastrointestinal: Negative.   Genitourinary: Negative.   Musculoskeletal: Negative.   Skin: Negative.   Neurological: Negative.   Endo/Heme/Allergies: Bruises/bleeds easily.  Psychiatric/Behavioral: Negative.   All other systems reviewed and are negative.   Past Medical History  Diagnosis Date  . COPD (chronic obstructive pulmonary disease)   . Anemia   . Bradycardia     mild,may be due to beta blocker therapy  . Lymphocytic colitis   . Lung cancer 2010    Dr. Julien Nordmann, finished chemo, sp radiation, left upper   . GERD (gastroesophageal reflux disease)     otc  . Arthritis   . Pneumonia     Past Surgical History  Procedure Laterality Date  . Neck surgery  1980's  . Dilation and curettage of uterus    . Uterine fibroid surgery  2012  . Bronchoscopy  2011  . Thoracotomy Right 09/21/2013    Procedure: THORACOTOMY  MAJOR;  Surgeon: Gaye Pollack, MD;  Location: [facility] OR;  Service: Thoracic;  Laterality: Right;  . Wedge resection Right 09/21/2013    Procedure: RIGHT UPPER LOBE WEDGE RESECTION;  Surgeon: Gaye Pollack, MD;  Location: [facility] OR;  Service: Thoracic;  Laterality: Right;    has COPD (chronic obstructive pulmonary disease); Anemia; Small cell lung cancer; Cough; S/P thoracotomy; and Other pancytopenia on her problem list.     is allergic to azithromycin and tussionex pennkinetic er.    Medication List       This list is accurate as of: 01/22/14  5:08 PM.  Always use your most recent med list.               fluticasone 50 MCG/ACT nasal spray  Commonly known as:  FLONASE  Place 2 sprays into both nostrils daily.     Fluticasone Furoate-Vilanterol 100-25 MCG/INH Aepb  Commonly known as:  BREO ELLIPTA  Inhale 1 puff into the lungs daily.     loratadine 10 MG tablet  Commonly known as:  CLARITIN  Take 1 tablet (10 mg total) by mouth daily.     omeprazole 20 MG capsule  Commonly known as:  PRILOSEC  Take 1 capsule (20 mg total) by mouth daily.     prochlorperazine 10 MG tablet  Commonly known as:  COMPAZINE  Take 1 tablet (10 mg total) by mouth every 6 (six) hours as needed for nausea or vomiting.  PHYSICAL EXAMINATION  Blood pressure 131/59, pulse 106, temperature 98.3 F (36.8 C), temperature source Oral, resp. rate 18, height 5\' 2"  (1.575 m), weight 132 lb 3.2 oz (59.966 kg), SpO2 98.00%.  Physical Exam  Nursing note and vitals reviewed. Constitutional: She is oriented to person, place, and time. Vital signs are normal. She appears unhealthy.  HENT:  Head: Normocephalic and atraumatic.  Nose: Nose normal.  Mouth/Throat: Oropharynx is clear and moist.  Eyes: Conjunctivae and EOM are normal. Pupils are equal, round, and reactive to light. No scleral icterus.  Neck: Normal range of motion. Neck supple. No JVD present. No tracheal deviation present. No thyromegaly  present.  Cardiovascular: Normal rate, regular rhythm, normal heart sounds and intact distal pulses.   Pulmonary/Chest: Effort normal and breath sounds normal. No respiratory distress. She has no wheezes. She has no rales.  Abdominal: Soft. Bowel sounds are normal. She exhibits no distension. There is no tenderness. There is no rebound.  Musculoskeletal: Normal range of motion. She exhibits no edema and no tenderness.  Lymphadenopathy:    She has no cervical adenopathy.  Neurological: She is alert and oriented to person, place, and time. Gait normal.  Skin: Skin is warm and dry. No rash noted. No erythema.  Patient has multiple bruises to her forearms and very stages of healing.  No epistaxis noted on exam.  Psychiatric: Affect normal.    LABORATORY DATA:. Infusion on 01/22/2014  Component Date Value Ref Range Status  . Unit Number 01/22/2014 [id number]   Final  . Blood Component Type 01/22/2014 PLTPHER LI2   Final  . Unit division 01/22/2014 00   Final  . Status of Unit 01/22/2014 ISSUED   Final  . Transfusion Status 01/22/2014 OK TO TRANSFUSE   Final  Hospital Outpatient Visit on 01/22/2014  Component Date Value Ref Range Status  . Order Confirmation 01/22/2014 ORDER PROCESSED BY BLOOD BANK   Final  . ABO/RH(D) 01/22/2014 B POS   Final  . Antibody Screen 01/22/2014 NEG   Final  . Sample Expiration 01/22/2014 01/25/2014   Final  . Unit Number 01/22/2014 [id number]   Final  . Blood Component Type 01/22/2014 RBC, LR IRR   Final  . Unit division 01/22/2014 00   Final  . Status of Unit 01/22/2014 ALLOCATED   Final  . Transfusion Status 01/22/2014 OK TO TRANSFUSE   Final  . Crossmatch Result 01/22/2014 Compatible   Final  . Unit Number 01/22/2014 [id number]   Final  . Blood Component Type 01/22/2014 RCLI PHER 2   Final  . Unit division 01/22/2014 00   Final  . Status of Unit 01/22/2014 ISSUED   Final  . Transfusion Status 01/22/2014 OK TO TRANSFUSE   Final  . Crossmatch  Result 01/22/2014 Compatible   Final  Appointment on 01/22/2014  Component Date Value Ref Range Status  . WBC 01/22/2014 5.5  3.9 - 10.3 10e3/uL Final  . NEUT# 01/22/2014 3.9  1.5 - 6.5 10e3/uL Final  . HGB 01/22/2014 6.1* 11.6 - 15.9 g/dL Final  . HCT 01/22/2014 18.6* 34.8 - 46.6 % Final  . Platelets 01/22/2014 7* 145 - 400 10e3/uL Final  . MCV 01/22/2014 92.5  79.5 - 101.0 fL Final  . MCH 01/22/2014 30.3  25.1 - 34.0 pg Final  . MCHC 01/22/2014 32.8  31.5 - 36.0 g/dL Final  . RBC 01/22/2014 2.01* 3.70 - 5.45 10e6/uL Final  . RDW 01/22/2014 17.9* 11.2 - 14.5 % Final  . lymph# 01/22/2014 0.7* 0.9 - 3.3  10e3/uL Final  . MONO# 01/22/2014 0.9  0.1 - 0.9 10e3/uL Final  . Eosinophils Absolute 01/22/2014 0.0  0.0 - 0.5 10e3/uL Final  . Basophils Absolute 01/22/2014 0.0  0.0 - 0.1 10e3/uL Final  . NEUT% 01/22/2014 71.3  38.4 - 76.8 % Final  . LYMPH% 01/22/2014 11.8* 14.0 - 49.7 % Final  . MONO% 01/22/2014 16.5* 0.0 - 14.0 % Final  . EOS% 01/22/2014 0.4  0.0 - 7.0 % Final  . BASO% 01/22/2014 0.0  0.0 - 2.0 % Final  . nRBC 01/22/2014 0  0 - 0 % Final  . Sodium 01/22/2014 143  136 - 145 mEq/L Final  . Potassium 01/22/2014 3.8  3.5 - 5.1 mEq/L Final  . Chloride 01/22/2014 109  98 - 109 mEq/L Final  . CO2 01/22/2014 26  22 - 29 mEq/L Final  . Glucose 01/22/2014 93  70 - 140 mg/dl Final  . BUN 01/22/2014 8.8  7.0 - 26.0 mg/dL Final  . Creatinine 01/22/2014 0.9  0.6 - 1.1 mg/dL Final  . Total Bilirubin 01/22/2014 0.28  0.20 - 1.20 mg/dL Final  . Alkaline Phosphatase 01/22/2014 109  40 - 150 U/L Final  . AST 01/22/2014 14  5 - 34 U/L Final  . ALT 01/22/2014 13  0 - 55 U/L Final  . Total Protein 01/22/2014 6.5  6.4 - 8.3 g/dL Final  . Albumin 01/22/2014 3.6  3.5 - 5.0 g/dL Final  . Calcium 01/22/2014 10.2  8.4 - 10.4 mg/dL Final  . Anion Gap 01/22/2014 8  3 - 11 mEq/L Final     RADIOGRAPHIC STUDIES: Ct Chest W Contrast  01/22/2014   CLINICAL DATA:  Right lung cancer  EXAM: CT CHEST WITH  CONTRAST  TECHNIQUE: Multidetector CT imaging of the chest was performed during intravenous contrast administration.  CONTRAST:  1mL OMNIPAQUE IOHEXOL 300 MG/ML  SOLN  COMPARISON:  12/08/2013  FINDINGS: Mediastinum: Normal heart size. No significant pericardial effusion. No mediastinal or hilar adenopathy identified. Stable appearance of soft tissue prominence in the left perihilar region.  Lungs/Pleura: Paramediastinal radiation change within the left lung is again noted and appears similar to prior exam. Stable postoperative changes from wedge resection in the right upper lobe. The small peripheral nodules in the right upper lobe are again noted and appear unchanged. The largest measures 4 mm, image 21/series 5. Right lower lobe pulmonary nodule is unchanged measuring 5 mm, image 36/series 5. No new or enlarging pulmonary nodules or masses.  Upper Abdomen: Incidental imaging through the upper abdomen is on unremarkable. The visualized portions of the liver and spleen appear normal. The adrenal glands are both normal.  Musculoskeletal: Review of the visualized osseous structures is negative for aggressive lytic or sclerotic bone lesion.  IMPRESSION: 1. No acute findings within the chest. 2. Stable soft tissue prominence in the left hilar region. Continued attention on future follow-up examination is recommended. 3. Small pulmonary nodules within the right lung are unchanged from previous exam.   Electronically Signed   By: Kerby Moors M.D.   On: 01/22/2014 11:54    ASSESSMENT/PLAN:    Small cell lung cancer  Assessment & Plan Patient received cycle 4 of her carboplatin/etoposide chemotherapy regimen on 01/08/2014.  She obtained a restaging CT of the chest earlier today; which revealed  stable disease.  She will return for a followup visit here at the [facility] on 01/25/2014.  She will be due for cycle 5 of the same regimen on 01/29/2014.   Other  pancytopenia  Assessment & Plan Patient with  marked chemotherapy-induced anemia and thrombocytopenia today.  Hemoglobin has decreased to 6.1; and platelet count has decreased to 7.  Patient has had some brief, mild epistaxis; and also has some increased bruising.  She denies any other known bleeding issues whatsoever.  Patient will receive a total of 2 units packed red blood cell transfusion and one unit of platelets.  She'll actually receive the platelets and one unit of the blood this afternoon; and will return for same in the morning to receive her second unit of blood.     Patient stated understanding of all instructions; and was in agreement with this plan of care. The patient knows to call the clinic with any problems, questions or concerns.   Review/collaboration with Dr. Julien Nordmann regarding all aspects of patient's visit today.   Total time spent with patient was 25 minutes;  with greater than 75 percent of that time spent in face to face counseling regarding her symptoms, and coordination of care and follow up.  Disclaimer: This note was dictated with voice recognition software. Similar sounding words can inadvertently be transcribed and may not be corrected upon review.   Drue Second, NP 01/22/2014

## 2014-01-22 NOTE — Telephone Encounter (Signed)
Has bruising under watch band and bloody nose today.Blood under dressing from venipuncture . Call sent to symptom management

## 2014-01-22 NOTE — Telephone Encounter (Signed)
I left message for pt to return my call about scheduling blood and platelets

## 2014-01-22 NOTE — Assessment & Plan Note (Addendum)
Patient received cycle 4 of her carboplatin/etoposide chemotherapy regimen on 01/08/2014.  She obtained a restaging CT of the chest earlier today; which revealed  stable disease.  She will return for a followup visit here at the [facility] on 01/25/2014.  She will be due for cycle 5 of the same regimen on 01/29/2014.

## 2014-01-23 ENCOUNTER — Ambulatory Visit (HOSPITAL_BASED_OUTPATIENT_CLINIC_OR_DEPARTMENT_OTHER): Payer: Medicare Other

## 2014-01-23 VITALS — BP 123/64 | HR 77 | Temp 98.1°F | Resp 18

## 2014-01-23 DIAGNOSIS — D6481 Anemia due to antineoplastic chemotherapy: Secondary | ICD-10-CM

## 2014-01-23 DIAGNOSIS — D61818 Other pancytopenia: Secondary | ICD-10-CM

## 2014-01-23 DIAGNOSIS — T451X5A Adverse effect of antineoplastic and immunosuppressive drugs, initial encounter: Principal | ICD-10-CM

## 2014-01-23 LAB — PREPARE PLATELET PHERESIS: Unit division: 0

## 2014-01-23 MED ORDER — DIPHENHYDRAMINE HCL 25 MG PO CAPS
25.0000 mg | ORAL_CAPSULE | Freq: Once | ORAL | Status: AC
  Administered 2014-01-23: 25 mg via ORAL

## 2014-01-23 MED ORDER — ACETAMINOPHEN 325 MG PO TABS
ORAL_TABLET | ORAL | Status: AC
  Filled 2014-01-23: qty 2

## 2014-01-23 MED ORDER — ACETAMINOPHEN 325 MG PO TABS
650.0000 mg | ORAL_TABLET | Freq: Once | ORAL | Status: AC
  Administered 2014-01-23: 650 mg via ORAL

## 2014-01-23 MED ORDER — SODIUM CHLORIDE 0.9 % IV SOLN
250.0000 mL | Freq: Once | INTRAVENOUS | Status: AC
  Administered 2014-01-23: 250 mL via INTRAVENOUS

## 2014-01-23 MED ORDER — DIPHENHYDRAMINE HCL 25 MG PO CAPS
ORAL_CAPSULE | ORAL | Status: AC
  Filled 2014-01-23: qty 1

## 2014-01-23 NOTE — Patient Instructions (Signed)

## 2014-01-24 ENCOUNTER — Telehealth: Payer: Self-pay | Admitting: Internal Medicine

## 2014-01-24 ENCOUNTER — Other Ambulatory Visit: Payer: Self-pay | Admitting: *Deleted

## 2014-01-24 DIAGNOSIS — C3490 Malignant neoplasm of unspecified part of unspecified bronchus or lung: Secondary | ICD-10-CM

## 2014-01-24 DIAGNOSIS — D61818 Other pancytopenia: Secondary | ICD-10-CM

## 2014-01-24 LAB — TYPE AND SCREEN
ABO/RH(D): B POS
Antibody Screen: NEGATIVE
Unit division: 0
Unit division: 0

## 2014-01-24 NOTE — Telephone Encounter (Signed)
s.w. pt and advised on 11.2 appt....pt wanted to cx 10.30 appt...dont pt aware

## 2014-01-25 ENCOUNTER — Ambulatory Visit: Payer: Medicare Other | Admitting: Internal Medicine

## 2014-01-29 ENCOUNTER — Ambulatory Visit (HOSPITAL_BASED_OUTPATIENT_CLINIC_OR_DEPARTMENT_OTHER): Payer: Medicare Other | Admitting: Internal Medicine

## 2014-01-29 ENCOUNTER — Other Ambulatory Visit (HOSPITAL_BASED_OUTPATIENT_CLINIC_OR_DEPARTMENT_OTHER): Payer: Medicare Other

## 2014-01-29 VITALS — BP 139/70 | HR 97 | Temp 98.2°F | Resp 19 | Ht 62.0 in | Wt 130.4 lb

## 2014-01-29 DIAGNOSIS — D696 Thrombocytopenia, unspecified: Secondary | ICD-10-CM

## 2014-01-29 DIAGNOSIS — C3411 Malignant neoplasm of upper lobe, right bronchus or lung: Secondary | ICD-10-CM

## 2014-01-29 DIAGNOSIS — D6481 Anemia due to antineoplastic chemotherapy: Secondary | ICD-10-CM

## 2014-01-29 DIAGNOSIS — C3491 Malignant neoplasm of unspecified part of right bronchus or lung: Secondary | ICD-10-CM

## 2014-01-29 DIAGNOSIS — C3490 Malignant neoplasm of unspecified part of unspecified bronchus or lung: Secondary | ICD-10-CM

## 2014-01-29 LAB — COMPREHENSIVE METABOLIC PANEL (CC13)
ALT: 20 U/L (ref 0–55)
AST: 19 U/L (ref 5–34)
Albumin: 4 g/dL (ref 3.5–5.0)
Alkaline Phosphatase: 110 U/L (ref 40–150)
Anion Gap: 6 mEq/L (ref 3–11)
BUN: 10.7 mg/dL (ref 7.0–26.0)
CO2: 28 mEq/L (ref 22–29)
Calcium: 10.4 mg/dL (ref 8.4–10.4)
Chloride: 107 mEq/L (ref 98–109)
Creatinine: 1 mg/dL (ref 0.6–1.1)
Glucose: 100 mg/dl (ref 70–140)
Potassium: 4.1 mEq/L (ref 3.5–5.1)
Sodium: 142 mEq/L (ref 136–145)
Total Bilirubin: 0.29 mg/dL (ref 0.20–1.20)
Total Protein: 7.1 g/dL (ref 6.4–8.3)

## 2014-01-29 LAB — CBC WITH DIFFERENTIAL/PLATELET
BASO%: 0.1 % (ref 0.0–2.0)
Basophils Absolute: 0 10*3/uL (ref 0.0–0.1)
EOS%: 0.7 % (ref 0.0–7.0)
Eosinophils Absolute: 0.1 10*3/uL (ref 0.0–0.5)
HCT: 29.4 % — ABNORMAL LOW (ref 34.8–46.6)
HGB: 9.7 g/dL — ABNORMAL LOW (ref 11.6–15.9)
LYMPH%: 12.3 % — ABNORMAL LOW (ref 14.0–49.7)
MCH: 31 pg (ref 25.1–34.0)
MCHC: 33 g/dL (ref 31.5–36.0)
MCV: 93.9 fL (ref 79.5–101.0)
MONO#: 1 10*3/uL — ABNORMAL HIGH (ref 0.1–0.9)
MONO%: 13.6 % (ref 0.0–14.0)
NEUT#: 5.2 10*3/uL (ref 1.5–6.5)
NEUT%: 73.3 % (ref 38.4–76.8)
Platelets: 53 10*3/uL — ABNORMAL LOW (ref 145–400)
RBC: 3.13 10*6/uL — ABNORMAL LOW (ref 3.70–5.45)
RDW: 18 % — ABNORMAL HIGH (ref 11.2–14.5)
WBC: 7.1 10*3/uL (ref 3.9–10.3)
lymph#: 0.9 10*3/uL (ref 0.9–3.3)
nRBC: 0 % (ref 0–0)

## 2014-01-29 NOTE — Progress Notes (Signed)
[place] Telephone:(336) [phone]   Fax:(336) [place], [place] [address]  PRINCIPAL DIAGNOSIS: recurrent small cell lung cancer initially diagnosed as Limited stage small cell lung cancer in June 2011.   PRIOR THERAPY:  1. Status post 4 cycles of systemic chemotherapy with carboplatin and etoposide. Last dose was given 11/14/2009. This was concurrent with radiotherapy. 2. Status post prophylactic cranial irradiation completed January 28, 2010. 3. Status post wedge resection of the right upper lobe lung nodule under the care of Dr. Cyndia Bent on 09/21/2013 and the final pathology was consistent with small cell lung cancer. 4. systemic chemotherapy with carboplatin for AUC of 5 on day 1 and etoposide 120 mg/M2 on days 1, 2 and 3 with Neulasta support on day 4 every 3 weeks. Status post 4 cycles. First dose on 10/30/2013.  CURRENT THERAPY: None.  INTERVAL HISTORY: Claudia [name] 68 y.o. female returns to the clinic today for followup visit accompanied by her husband. The patient is feeling fine today with no specific complaints. She tolerated the last cycle of her systemic chemotherapy with carboplatin and etoposide fairly well with no significant complaints except for significant anemia requiring PRBCs transfusion and thrombocytopenia. She was feeling fine and did not feel the effect of the anemia. She denied having any bleeding, bruises or ecchymosis. The patient denied having any significant nausea or vomiting, no fever or chills. She denied having any significant shortness of breath, cough or hemoptysis. She denied having any significant weight loss or night sweats. She had repeat CT scan of the chest performed recently and she is here for evaluation and discussion of her scan results.  MEDICAL HISTORY: Past Medical History  Diagnosis Date  . COPD (chronic obstructive pulmonary disease)   .  Anemia   . Bradycardia     mild,may be due to beta blocker therapy  . Lymphocytic colitis   . Lung cancer 2010    Dr. Julien Nordmann, finished chemo, sp radiation, left upper   . GERD (gastroesophageal reflux disease)     otc  . Arthritis   . Pneumonia     ALLERGIES:  is allergic to azithromycin and tussionex pennkinetic er.  MEDICATIONS:  Current Outpatient Prescriptions  Medication Sig Dispense Refill  . fluticasone (FLONASE) 50 MCG/ACT nasal spray Place 2 sprays into both nostrils daily. 16 g 4  . Fluticasone Furoate-Vilanterol (BREO ELLIPTA) 100-25 MCG/INH AEPB Inhale 1 puff into the lungs daily. 1 each 5  . loratadine (CLARITIN) 10 MG tablet Take 1 tablet (10 mg total) by mouth daily.    Marland Kitchen omeprazole (PRILOSEC) 20 MG capsule Take 1 capsule (20 mg total) by mouth daily. 30 capsule 11  . prochlorperazine (COMPAZINE) 10 MG tablet Take 1 tablet (10 mg total) by mouth every 6 (six) hours as needed for nausea or vomiting. 30 tablet 0   No current facility-administered medications for this visit.    SURGICAL HISTORY:  Past Surgical History  Procedure Laterality Date  . Neck surgery  1980's  . Dilation and curettage of uterus    . Uterine fibroid surgery  2012  . Bronchoscopy  2011  . Thoracotomy Right 09/21/2013    Procedure: THORACOTOMY MAJOR;  Surgeon: Gaye Pollack, MD;  Location: [facility];  Service: Thoracic;  Laterality: Right;  . Wedge resection Right 09/21/2013    Procedure: RIGHT UPPER LOBE WEDGE RESECTION;  Surgeon: Gaye Pollack, MD;  Location: [facility]  OR;  Service: Thoracic;  Laterality: Right;    REVIEW OF SYSTEMS:  Constitutional: negative Eyes: negative Ears, nose, mouth, throat, and face: negative Respiratory: positive for pleurisy/chest pain Cardiovascular: negative Gastrointestinal: negative Genitourinary:negative Integument/breast: negative Hematologic/lymphatic: negative Musculoskeletal:negative Neurological: negative Behavioral/Psych: negative Endocrine:  negative Allergic/Immunologic: negative   PHYSICAL EXAMINATION: General appearance: alert, cooperative and no distress Head: Normocephalic, without obvious abnormality, atraumatic Neck: no adenopathy Lymph nodes: Cervical, supraclavicular, and axillary nodes normal. Resp: clear to auscultation bilaterally Back: symmetric, no curvature. ROM normal. No CVA tenderness. Cardio: regular rate and rhythm, S1, S2 normal, no murmur, click, rub or gallop GI: soft, non-tender; bowel sounds normal; no masses,  no organomegaly Extremities: extremities normal, atraumatic, no cyanosis or edema Neurologic: Alert and oriented X 3, normal strength and tone. Normal symmetric reflexes. Normal coordination and gait  ECOG PERFORMANCE STATUS: 0 - Asymptomatic  Blood pressure 139/70, pulse 97, temperature 98.2 F (36.8 C), temperature source Oral, resp. rate 19, height 5\' 2"  (1.575 m), weight 130 lb 6.4 oz (59.149 kg), SpO2 100 %.  LABORATORY DATA: Lab Results  Component Value Date   WBC 7.1 01/29/2014   HGB 9.7* 01/29/2014   HCT 29.4* 01/29/2014   MCV 93.9 01/29/2014   PLT 53* 01/29/2014      Chemistry      Component Value Date/Time   NA 143 01/22/2014 0943   NA 137 11/06/2013 0906   NA 140 08/05/2011 0823   K 3.8 01/22/2014 0943   K 4.2 11/06/2013 0906   K 4.2 08/05/2011 0823   CL 102 11/06/2013 0906   CL 103 08/03/2012 0842   CL 98 08/05/2011 0823   CO2 26 01/22/2014 0943   CO2 27 11/06/2013 0906   CO2 30 08/05/2011 0823   BUN 8.8 01/22/2014 0943   BUN 15 11/06/2013 0906   BUN 13 08/05/2011 0823   CREATININE 0.9 01/22/2014 0943   CREATININE 0.76 11/06/2013 0906   CREATININE 1.0 08/05/2011 0823      Component Value Date/Time   CALCIUM 10.2 01/22/2014 0943   CALCIUM 10.4 11/06/2013 0906   CALCIUM 9.4 08/05/2011 0823   ALKPHOS 109 01/22/2014 0943   ALKPHOS 112 11/06/2013 0906   ALKPHOS 69 08/05/2011 0823   AST 14 01/22/2014 0943   AST 13 11/06/2013 0906   AST 19 08/05/2011 0823    ALT 13 01/22/2014 0943   ALT 11 11/06/2013 0906   ALT 16 08/05/2011 0823   BILITOT 0.28 01/22/2014 0943   BILITOT 1.3* 11/06/2013 0906   BILITOT 0.70 08/05/2011 0823       RADIOGRAPHIC STUDIES: Ct Chest W Contrast  01/22/2014   CLINICAL DATA:  Right lung cancer  EXAM: CT CHEST WITH CONTRAST  TECHNIQUE: Multidetector CT imaging of the chest was performed during intravenous contrast administration.  CONTRAST:  55mL OMNIPAQUE IOHEXOL 300 MG/ML  SOLN  COMPARISON:  12/08/2013  FINDINGS: Mediastinum: Normal heart size. No significant pericardial effusion. No mediastinal or hilar adenopathy identified. Stable appearance of soft tissue prominence in the left perihilar region.  Lungs/Pleura: Paramediastinal radiation change within the left lung is again noted and appears similar to prior exam. Stable postoperative changes from wedge resection in the right upper lobe. The small peripheral nodules in the right upper lobe are again noted and appear unchanged. The largest measures 4 mm, image 21/series 5. Right lower lobe pulmonary nodule is unchanged measuring 5 mm, image 36/series 5. No new or enlarging pulmonary nodules or masses.  Upper Abdomen: Incidental imaging through the upper abdomen  is on unremarkable. The visualized portions of the liver and spleen appear normal. The adrenal glands are both normal.  Musculoskeletal: Review of the visualized osseous structures is negative for aggressive lytic or sclerotic bone lesion.  IMPRESSION: 1. No acute findings within the chest. 2. Stable soft tissue prominence in the left hilar region. Continued attention on future follow-up examination is recommended. 3. Small pulmonary nodules within the right lung are unchanged from previous exam.   Electronically Signed   By: Kerby Moors M.D.   On: 01/22/2014 11:54   ASSESSMENT AND PLAN: This is a very pleasant 68 years old white female with limited stage small cell lung cancer diagnosed in June of 2011 status post 4  cycles of systemic chemotherapy with carboplatin and etoposide concurrent with radiation followed by prophylactic cranial irradiation and has been observation since November 2007. She recently underwent right upper lobe wedge resection of the pulmonary nodules and the final pathology was consistent with recurrent small cell lung cancer. The patient was started on systemic chemotherapy with carboplatin and etoposide status post 4 cycles and tolerating her treatment fairly well except for chemotherapy-induced anemia and thrombocytopenia.  She will receive 2 units of PRBCs transfusion last week. Her recent CT scan of the chest showed no evidence for disease progression. I discussed the scan results with the patient and her husband. I recommended for her to continue on observation with repeat CT scan of the chest in 3 months for reevaluation of her disease. She was advised to call immediately if she has any concerning symptoms in the interval  All questions were answered. The patient knows to call the clinic with any problems, questions or concerns. We can certainly see the patient much sooner if necessary.  Disclaimer: This note was dictated with voice recognition software. Similar sounding words can inadvertently be transcribed and may not be corrected upon review.

## 2014-01-30 ENCOUNTER — Telehealth: Payer: Self-pay | Admitting: Internal Medicine

## 2014-01-30 ENCOUNTER — Other Ambulatory Visit: Payer: Self-pay | Admitting: *Deleted

## 2014-01-30 DIAGNOSIS — C3490 Malignant neoplasm of unspecified part of unspecified bronchus or lung: Secondary | ICD-10-CM

## 2014-01-30 NOTE — Progress Notes (Signed)
Per Dr Vista Mink, pt needs to have lab work 1 more time before being on observation until 04/2014.  Pt's appt is at 8:15am and she is going to wait for her lab results.  Informed her desk RN does not arrive until 8:30am.  Will inform desk RN, Abelina Bachelor.

## 2014-01-30 NOTE — Telephone Encounter (Signed)
Confirm appt d/t for Jan/Feb 2016. Mailed cal

## 2014-02-05 ENCOUNTER — Other Ambulatory Visit (HOSPITAL_BASED_OUTPATIENT_CLINIC_OR_DEPARTMENT_OTHER): Payer: Medicare Other

## 2014-02-05 DIAGNOSIS — C3490 Malignant neoplasm of unspecified part of unspecified bronchus or lung: Secondary | ICD-10-CM

## 2014-02-05 DIAGNOSIS — C3411 Malignant neoplasm of upper lobe, right bronchus or lung: Secondary | ICD-10-CM

## 2014-02-05 LAB — COMPREHENSIVE METABOLIC PANEL (CC13)
ALT: 13 U/L (ref 0–55)
AST: 16 U/L (ref 5–34)
Albumin: 3.9 g/dL (ref 3.5–5.0)
Alkaline Phosphatase: 87 U/L (ref 40–150)
Anion Gap: 7 mEq/L (ref 3–11)
BUN: 13.5 mg/dL (ref 7.0–26.0)
CO2: 26 mEq/L (ref 22–29)
Calcium: 9.8 mg/dL (ref 8.4–10.4)
Chloride: 108 mEq/L (ref 98–109)
Creatinine: 0.9 mg/dL (ref 0.6–1.1)
Glucose: 97 mg/dl (ref 70–140)
Potassium: 3.8 mEq/L (ref 3.5–5.1)
Sodium: 141 mEq/L (ref 136–145)
Total Bilirubin: 0.4 mg/dL (ref 0.20–1.20)
Total Protein: 6.9 g/dL (ref 6.4–8.3)

## 2014-02-05 LAB — CBC WITH DIFFERENTIAL/PLATELET
BASO%: 1.5 % (ref 0.0–2.0)
Basophils Absolute: 0.1 10*3/uL (ref 0.0–0.1)
EOS%: 0.4 % (ref 0.0–7.0)
Eosinophils Absolute: 0 10*3/uL (ref 0.0–0.5)
HCT: 30.8 % — ABNORMAL LOW (ref 34.8–46.6)
HGB: 10 g/dL — ABNORMAL LOW (ref 11.6–15.9)
LYMPH%: 10.1 % — ABNORMAL LOW (ref 14.0–49.7)
MCH: 31.4 pg (ref 25.1–34.0)
MCHC: 32.5 g/dL (ref 31.5–36.0)
MCV: 96.6 fL (ref 79.5–101.0)
MONO#: 0.9 10*3/uL (ref 0.1–0.9)
MONO%: 19.9 % — ABNORMAL HIGH (ref 0.0–14.0)
NEUT#: 3.1 10*3/uL (ref 1.5–6.5)
NEUT%: 68.1 % (ref 38.4–76.8)
Platelets: 171 10*3/uL (ref 145–400)
RBC: 3.19 10*6/uL — ABNORMAL LOW (ref 3.70–5.45)
RDW: 19.5 % — ABNORMAL HIGH (ref 11.2–14.5)
WBC: 4.5 10*3/uL (ref 3.9–10.3)
lymph#: 0.5 10*3/uL — ABNORMAL LOW (ref 0.9–3.3)

## 2014-02-05 LAB — HOLD TUBE, BLOOD BANK

## 2014-04-10 ENCOUNTER — Ambulatory Visit: Payer: Medicare Other | Admitting: Emergency Medicine

## 2014-04-23 ENCOUNTER — Ambulatory Visit (HOSPITAL_COMMUNITY)
Admission: RE | Admit: 2014-04-23 | Discharge: 2014-04-23 | Disposition: A | Discharge status: Home / Self Care | Payer: Medicare Other | Source: Ambulatory Visit | Attending: Internal Medicine | Admitting: Internal Medicine

## 2014-04-23 ENCOUNTER — Other Ambulatory Visit (HOSPITAL_BASED_OUTPATIENT_CLINIC_OR_DEPARTMENT_OTHER): Payer: Medicare Other

## 2014-04-23 ENCOUNTER — Encounter (HOSPITAL_COMMUNITY): Payer: Self-pay

## 2014-04-23 ENCOUNTER — Other Ambulatory Visit: Payer: Medicare Other

## 2014-04-23 DIAGNOSIS — Z08 Encounter for follow-up examination after completed treatment for malignant neoplasm: Secondary | ICD-10-CM | POA: Diagnosis present

## 2014-04-23 DIAGNOSIS — C3411 Malignant neoplasm of upper lobe, right bronchus or lung: Secondary | ICD-10-CM

## 2014-04-23 DIAGNOSIS — D6481 Anemia due to antineoplastic chemotherapy: Secondary | ICD-10-CM

## 2014-04-23 DIAGNOSIS — Z9221 Personal history of antineoplastic chemotherapy: Secondary | ICD-10-CM | POA: Insufficient documentation

## 2014-04-23 DIAGNOSIS — Z923 Personal history of irradiation: Secondary | ICD-10-CM | POA: Diagnosis not present

## 2014-04-23 DIAGNOSIS — C3491 Malignant neoplasm of unspecified part of right bronchus or lung: Secondary | ICD-10-CM

## 2014-04-23 HISTORY — DX: Malignant neoplasm of unspecified part of unspecified bronchus or lung: C34.90

## 2014-04-23 LAB — COMPREHENSIVE METABOLIC PANEL (CC13)
ALT: 12 U/L (ref 0–55)
AST: 16 U/L (ref 5–34)
Albumin: 3.9 g/dL (ref 3.5–5.0)
Alkaline Phosphatase: 88 U/L (ref 40–150)
Anion Gap: 8 mEq/L (ref 3–11)
BUN: 13.5 mg/dL (ref 7.0–26.0)
CO2: 29 mEq/L (ref 22–29)
Calcium: 9.9 mg/dL (ref 8.4–10.4)
Chloride: 105 mEq/L (ref 98–109)
Creatinine: 0.9 mg/dL (ref 0.6–1.1)
EGFR: 69 mL/min/{1.73_m2} — ABNORMAL LOW (ref >90)
Glucose: 90 mg/dl (ref 70–140)
Potassium: 4 mEq/L (ref 3.5–5.1)
Sodium: 143 mEq/L (ref 136–145)
Total Bilirubin: 0.4 mg/dL (ref 0.20–1.20)
Total Protein: 7 g/dL (ref 6.4–8.3)

## 2014-04-23 LAB — CBC WITH DIFFERENTIAL/PLATELET
BASO%: 0.7 % (ref 0.0–2.0)
Basophils Absolute: 0 10*3/uL (ref 0.0–0.1)
EOS%: 2.9 % (ref 0.0–7.0)
Eosinophils Absolute: 0.1 10*3/uL (ref 0.0–0.5)
HCT: 38.5 % (ref 34.8–46.6)
HGB: 12.5 g/dL (ref 11.6–15.9)
LYMPH%: 16.3 % (ref 14.0–49.7)
MCH: 31.2 pg (ref 25.1–34.0)
MCHC: 32.5 g/dL (ref 31.5–36.0)
MCV: 96 fL (ref 79.5–101.0)
MONO#: 0.6 10*3/uL (ref 0.1–0.9)
MONO%: 13.4 % (ref 0.0–14.0)
NEUT#: 2.8 10*3/uL (ref 1.5–6.5)
NEUT%: 66.7 % (ref 38.4–76.8)
Platelets: 136 10*3/uL — ABNORMAL LOW (ref 145–400)
RBC: 4.01 10*6/uL (ref 3.70–5.45)
RDW: 13.7 % (ref 11.2–14.5)
WBC: 4.2 10*3/uL (ref 3.9–10.3)
lymph#: 0.7 10*3/uL — ABNORMAL LOW (ref 0.9–3.3)

## 2014-04-23 MED ORDER — IOHEXOL 300 MG/ML  SOLN
80.0000 mL | Freq: Once | INTRAMUSCULAR | Status: AC | PRN
  Administered 2014-04-23: 80 mL via INTRAVENOUS

## 2014-04-30 ENCOUNTER — Ambulatory Visit (HOSPITAL_BASED_OUTPATIENT_CLINIC_OR_DEPARTMENT_OTHER): Payer: Medicare Other | Admitting: Internal Medicine

## 2014-04-30 ENCOUNTER — Encounter: Payer: Self-pay | Admitting: Internal Medicine

## 2014-04-30 ENCOUNTER — Telehealth: Payer: Self-pay | Admitting: Internal Medicine

## 2014-04-30 VITALS — BP 125/61 | HR 81 | Temp 98.3°F | Resp 18

## 2014-04-30 DIAGNOSIS — C3491 Malignant neoplasm of unspecified part of right bronchus or lung: Secondary | ICD-10-CM

## 2014-04-30 DIAGNOSIS — J449 Chronic obstructive pulmonary disease, unspecified: Secondary | ICD-10-CM

## 2014-04-30 DIAGNOSIS — C3411 Malignant neoplasm of upper lobe, right bronchus or lung: Secondary | ICD-10-CM

## 2014-04-30 NOTE — Telephone Encounter (Signed)
gv and printed appt sched and avs for pt for May °

## 2014-04-30 NOTE — Progress Notes (Signed)
[place] Telephone:([phone]   Fax:(336) [place], [place] [address]  PRINCIPAL DIAGNOSIS: recurrent small cell lung cancer initially diagnosed as Limited stage small cell lung cancer in June 2011.   PRIOR THERAPY:  1. Status post 4 cycles of systemic chemotherapy with carboplatin and etoposide. Last dose was given 11/14/2009. This was concurrent with radiotherapy. 2. Status post prophylactic cranial irradiation completed January 28, 2010. 3. Status post wedge resection of the right upper lobe lung nodule under the care of Dr. Cyndia Bent on 09/21/2013 and the final pathology was consistent with small cell lung cancer. 4. systemic chemotherapy with carboplatin for AUC of 5 on day 1 and etoposide 120 mg/M2 on days 1, 2 and 3 with Neulasta support on day 4 every 3 weeks. Status post 4 cycles. First dose on 10/30/2013.  CURRENT THERAPY: Observation.  INTERVAL HISTORY: Claudia [name] 69 y.o. female returns to the clinic today for followup visit. The patient is feeling fine today with no specific complaints. The patient denied having any significant nausea or vomiting, no fever or chills. She denied having any significant shortness of breath, cough or hemoptysis. She denied having any significant weight loss or night sweats. She had repeat CT scan of the chest performed recently and she is here for evaluation and discussion of her scan results.  MEDICAL HISTORY: Past Medical History  Diagnosis Date  . COPD (chronic obstructive pulmonary disease)   . Anemia   . Bradycardia     mild,may be due to beta blocker therapy  . Lymphocytic colitis   . GERD (gastroesophageal reflux disease)     otc  . Arthritis   . Pneumonia   . Lung cancer 2010    Dr. Julien Nordmann, finished chemo, sp radiation, left upper   . Local recurrence of lung cancer dx'd 07/2013    rt thoracotomy chemo comp 12/2013     ALLERGIES:  is allergic to azithromycin and tussionex pennkinetic er.  MEDICATIONS:  Current Outpatient Prescriptions  Medication Sig Dispense Refill  . fluticasone (FLONASE) 50 MCG/ACT nasal spray Place 2 sprays into both nostrils daily. 16 g 4  . Fluticasone Furoate-Vilanterol (BREO ELLIPTA) 100-25 MCG/INH AEPB Inhale 1 puff into the lungs daily. 1 each 5  . loratadine (CLARITIN) 10 MG tablet Take 1 tablet (10 mg total) by mouth daily.     No current facility-administered medications for this visit.    SURGICAL HISTORY:  Past Surgical History  Procedure Laterality Date  . Neck surgery  1980's  . Dilation and curettage of uterus    . Uterine fibroid surgery  2012  . Bronchoscopy  2011  . Thoracotomy Right 09/21/2013    Procedure: THORACOTOMY MAJOR;  Surgeon: Gaye Pollack, MD;  Location: [facility] OR;  Service: Thoracic;  Laterality: Right;  . Wedge resection Right 09/21/2013    Procedure: RIGHT UPPER LOBE WEDGE RESECTION;  Surgeon: Gaye Pollack, MD;  Location: [facility];  Service: Thoracic;  Laterality: Right;    REVIEW OF SYSTEMS:  Constitutional: negative Eyes: negative Ears, nose, mouth, throat, and face: negative Respiratory: positive for pleurisy/chest pain Cardiovascular: negative Gastrointestinal: negative Genitourinary:negative Integument/breast: negative Hematologic/lymphatic: negative Musculoskeletal:negative Neurological: negative Behavioral/Psych: negative Endocrine: negative Allergic/Immunologic: negative   PHYSICAL EXAMINATION: General appearance: alert, cooperative and no distress Head: Normocephalic, without obvious abnormality, atraumatic Neck: no adenopathy Lymph nodes: Cervical, supraclavicular, and axillary nodes normal. Resp: clear to auscultation bilaterally Back: symmetric,  no curvature. ROM normal. No CVA tenderness. Cardio: regular rate and rhythm, S1, S2 normal, no murmur, click, rub or gallop GI: soft, non-tender; bowel sounds normal; no masses,   no organomegaly Extremities: extremities normal, atraumatic, no cyanosis or edema Neurologic: Alert and oriented X 3, normal strength and tone. Normal symmetric reflexes. Normal coordination and gait  ECOG PERFORMANCE STATUS: 0 - Asymptomatic  Blood pressure 125/61, pulse 81, temperature 98.3 F (36.8 C), temperature source Oral, resp. rate 18.  LABORATORY DATA: Lab Results  Component Value Date   WBC 4.2 04/23/2014   HGB 12.5 04/23/2014   HCT 38.5 04/23/2014   MCV 96.0 04/23/2014   PLT 136* 04/23/2014      Chemistry      Component Value Date/Time   NA 143 04/23/2014 0837   NA 137 11/06/2013 0906   NA 140 08/05/2011 0823   K 4.0 04/23/2014 0837   K 4.2 11/06/2013 0906   K 4.2 08/05/2011 0823   CL 102 11/06/2013 0906   CL 103 08/03/2012 0842   CL 98 08/05/2011 0823   CO2 29 04/23/2014 0837   CO2 27 11/06/2013 0906   CO2 30 08/05/2011 0823   BUN 13.5 04/23/2014 0837   BUN 15 11/06/2013 0906   BUN 13 08/05/2011 0823   CREATININE 0.9 04/23/2014 0837   CREATININE 0.76 11/06/2013 0906   CREATININE 1.0 08/05/2011 0823      Component Value Date/Time   CALCIUM 9.9 04/23/2014 0837   CALCIUM 10.4 11/06/2013 0906   CALCIUM 9.4 08/05/2011 0823   ALKPHOS 88 04/23/2014 0837   ALKPHOS 112 11/06/2013 0906   ALKPHOS 69 08/05/2011 0823   AST 16 04/23/2014 0837   AST 13 11/06/2013 0906   AST 19 08/05/2011 0823   ALT 12 04/23/2014 0837   ALT 11 11/06/2013 0906   ALT 16 08/05/2011 0823   BILITOT 0.40 04/23/2014 0837   BILITOT 1.3* 11/06/2013 0906   BILITOT 0.70 08/05/2011 0823       RADIOGRAPHIC STUDIES: Ct Chest W Contrast  04/23/2014   CLINICAL DATA:  Recurrent squamous cell carcinoma. Chemotherapy complete, radiation therapy complete.  EXAM: CT CHEST WITH CONTRAST  TECHNIQUE: Multidetector CT imaging of the chest was performed during intravenous contrast administration.  CONTRAST:  66mL OMNIPAQUE IOHEXOL 300 MG/ML  SOLN  COMPARISON:  None.  FINDINGS: Mediastinum/Nodes: No  pathologically enlarged mediastinal, hilar or axillary lymph nodes. Esophagus is air-filled dilated. Heart size normal. No pericardial effusion.  Lungs/Pleura: Small amount of loculated pleural fluid are seen in the hemithoraces bilaterally, left greater than right. Radiation fibrosis and volume loss in the medial aspect of the left hemi thorax. Mild centrilobular emphysema. Scattered pulmonary nodules measure up to 4 mm in the right lower lobe, stable. There may be slight eccentric thickening along a focal bed of emphysema in the posterior segment right upper lobe (series 5, image 20), new. Narrowing of the left upper lobe bronchus. Airway is otherwise unremarkable.  Upper abdomen: Low-attenuation lesion in the left hepatic lobe measures 2.1 cm and has peripheral contrast puddling, stable, indicative of a hemangioma. Visualized portions of the liver, gallbladder and adrenal glands are otherwise unremarkable. Low-attenuation lesions in the kidneys measure up to 10 mm on the right, as before. Statistically, cysts are most likely. Visualized portions of the kidneys, spleen, pancreas, stomach and bowel are otherwise grossly unremarkable.  Musculoskeletal: No worrisome lytic or sclerotic lesions. Probable old right rib fracture (series 2, image 37).  IMPRESSION: 1. Post radiation scarring and volume loss  in the left hemi thorax, stable, without evidence of recurrent or metastatic disease. 2. Possible development of mild eccentric wall thickening along a focal bed of emphysema in the posterior segment right upper lobe. Continued attention on followup exams is warranted. 3. Small loculated pleural fluid bilaterally, left greater than right.   Electronically Signed   By: Lorin Picket M.D.   On: 04/23/2014 10:55   ASSESSMENT AND PLAN: This is a very pleasant 69 years old white female with limited stage small cell lung cancer diagnosed in June of 2011 status post 4 cycles of systemic chemotherapy with carboplatin and  etoposide concurrent with radiation followed by prophylactic cranial irradiation and has been observation since November 2007. She recently underwent right upper lobe wedge resection of the pulmonary nodules and the final pathology was consistent with recurrent small cell lung cancer. The patient was started on systemic chemotherapy with carboplatin and etoposide status post 4 cycles and tolerating her treatment fairly well. Her recent CT scan of the chest showed no evidence for disease progression. I discussed the scan results with the patient. I recommended for her to continue on observation with repeat CT scan of the chest in 3 months for reevaluation of her disease. She was advised to call immediately if she has any concerning symptoms in the interval  All questions were answered. The patient knows to call the clinic with any problems, questions or concerns. We can certainly see the patient much sooner if necessary.  Disclaimer: This note was dictated with voice recognition software. Similar sounding words can inadvertently be transcribed and may not be corrected upon review.

## 2014-05-02 ENCOUNTER — Telehealth: Payer: Self-pay | Admitting: Internal Medicine

## 2014-05-02 NOTE — Telephone Encounter (Signed)
pt called to r/s appt due to having another appt..done pt ok and aware of new d.t

## 2014-07-30 ENCOUNTER — Encounter (HOSPITAL_COMMUNITY): Payer: Self-pay

## 2014-07-30 ENCOUNTER — Other Ambulatory Visit (HOSPITAL_BASED_OUTPATIENT_CLINIC_OR_DEPARTMENT_OTHER): Payer: Medicare Other

## 2014-07-30 ENCOUNTER — Ambulatory Visit (HOSPITAL_COMMUNITY)
Admission: RE | Admit: 2014-07-30 | Discharge: 2014-07-30 | Disposition: A | Discharge status: Home / Self Care | Payer: Medicare Other | Source: Ambulatory Visit | Attending: Internal Medicine | Admitting: Internal Medicine

## 2014-07-30 DIAGNOSIS — D61818 Other pancytopenia: Secondary | ICD-10-CM

## 2014-07-30 DIAGNOSIS — C3491 Malignant neoplasm of unspecified part of right bronchus or lung: Secondary | ICD-10-CM | POA: Diagnosis present

## 2014-07-30 DIAGNOSIS — C3411 Malignant neoplasm of upper lobe, right bronchus or lung: Secondary | ICD-10-CM

## 2014-07-30 DIAGNOSIS — Z9889 Other specified postprocedural states: Secondary | ICD-10-CM | POA: Diagnosis not present

## 2014-07-30 LAB — CBC WITH DIFFERENTIAL/PLATELET
BASO%: 0.8 % (ref 0.0–2.0)
Basophils Absolute: 0 10*3/uL (ref 0.0–0.1)
EOS%: 2.8 % (ref 0.0–7.0)
Eosinophils Absolute: 0.1 10*3/uL (ref 0.0–0.5)
HCT: 36.5 % (ref 34.8–46.6)
HGB: 12 g/dL (ref 11.6–15.9)
LYMPH%: 10.5 % — ABNORMAL LOW (ref 14.0–49.7)
MCH: 31 pg (ref 25.1–34.0)
MCHC: 33 g/dL (ref 31.5–36.0)
MCV: 94.1 fL (ref 79.5–101.0)
MONO#: 0.7 10*3/uL (ref 0.1–0.9)
MONO%: 14.8 % — ABNORMAL HIGH (ref 0.0–14.0)
NEUT#: 3.1 10*3/uL (ref 1.5–6.5)
NEUT%: 71.1 % (ref 38.4–76.8)
Platelets: 168 10*3/uL (ref 145–400)
RBC: 3.88 10*6/uL (ref 3.70–5.45)
RDW: 14.6 % — ABNORMAL HIGH (ref 11.2–14.5)
WBC: 4.4 10*3/uL (ref 3.9–10.3)
lymph#: 0.5 10*3/uL — ABNORMAL LOW (ref 0.9–3.3)

## 2014-07-30 LAB — COMPREHENSIVE METABOLIC PANEL (CC13)
ALT: 10 U/L (ref 0–55)
AST: 16 U/L (ref 5–34)
Albumin: 3.9 g/dL (ref 3.5–5.0)
Alkaline Phosphatase: 77 U/L (ref 40–150)
Anion Gap: 8 mEq/L (ref 3–11)
BUN: 14.1 mg/dL (ref 7.0–26.0)
CO2: 30 mEq/L — ABNORMAL HIGH (ref 22–29)
Calcium: 10.4 mg/dL (ref 8.4–10.4)
Chloride: 105 mEq/L (ref 98–109)
Creatinine: 0.9 mg/dL (ref 0.6–1.1)
EGFR: 69 mL/min/{1.73_m2} — ABNORMAL LOW (ref >90)
Glucose: 90 mg/dl (ref 70–140)
Potassium: 4 mEq/L (ref 3.5–5.1)
Sodium: 143 mEq/L (ref 136–145)
Total Bilirubin: 0.41 mg/dL (ref 0.20–1.20)
Total Protein: 7 g/dL (ref 6.4–8.3)

## 2014-07-30 MED ORDER — IOHEXOL 300 MG/ML  SOLN
100.0000 mL | Freq: Once | INTRAMUSCULAR | Status: AC | PRN
  Administered 2014-07-30: 80 mL via INTRAVENOUS

## 2014-08-06 ENCOUNTER — Ambulatory Visit: Payer: Medicare Other | Admitting: Internal Medicine

## 2014-08-07 ENCOUNTER — Encounter: Payer: Self-pay | Admitting: Internal Medicine

## 2014-08-07 ENCOUNTER — Ambulatory Visit (HOSPITAL_BASED_OUTPATIENT_CLINIC_OR_DEPARTMENT_OTHER): Payer: Medicare Other | Admitting: Internal Medicine

## 2014-08-07 ENCOUNTER — Telehealth: Payer: Self-pay | Admitting: Internal Medicine

## 2014-08-07 ENCOUNTER — Encounter: Payer: Self-pay | Admitting: General Practice

## 2014-08-07 VITALS — BP 151/65 | HR 64 | Temp 97.7°F | Resp 18 | Ht 62.0 in | Wt 136.7 lb

## 2014-08-07 DIAGNOSIS — C3491 Malignant neoplasm of unspecified part of right bronchus or lung: Secondary | ICD-10-CM

## 2014-08-07 DIAGNOSIS — C3411 Malignant neoplasm of upper lobe, right bronchus or lung: Secondary | ICD-10-CM | POA: Diagnosis not present

## 2014-08-07 NOTE — Telephone Encounter (Signed)
per pof to sch pt appt-gave pt copy of sch-adv [facility] will call to sch CT scans

## 2014-08-07 NOTE — Progress Notes (Signed)
[place] [phone]   Fax:(336) [place], [place] [address]  PRINCIPAL DIAGNOSIS: recurrent small cell lung cancer initially diagnosed as Limited stage small cell lung cancer in June 2011, with recurrence in June 2015.   PRIOR THERAPY:  1. Status post 4 cycles of systemic chemotherapy with carboplatin and etoposide. Last dose was given 11/14/2009. This was concurrent with radiotherapy. 2. Status post prophylactic cranial irradiation completed January 28, 2010. 3. Status post wedge resection of the right upper lobe lung nodule under the care of Dr. Cyndia Bent on 09/21/2013 and the final pathology was consistent with small cell lung cancer. 4. systemic chemotherapy with carboplatin for AUC of 5 on day 1 and etoposide 120 mg/M2 on days 1, 2 and 3 with Neulasta support on day 4 every 3 weeks. Status post 4 cycles. First dose on 10/30/2013.  CURRENT THERAPY: Observation.  INTERVAL HISTORY: Claudia [name] 69 y.o. female returns to the clinic today for followup visit. She has been observation for the last 6 months. The patient is feeling fine today with no specific complaints. The patient denied having any significant nausea or vomiting, no fever or chills. She denied having any significant shortness of breath, cough or hemoptysis. She denied having any significant weight loss or night sweats. She had repeat CT scan of the chest performed recently and she is here for evaluation and discussion of her scan results.  MEDICAL HISTORY: Past Medical History  Diagnosis Date  . COPD (chronic obstructive pulmonary disease)   . Anemia   . Bradycardia     mild,may be due to beta blocker therapy  . Lymphocytic colitis   . GERD (gastroesophageal reflux disease)     otc  . Arthritis   . Pneumonia   . Lung cancer 2010    Dr. Julien Nordmann, finished chemo, sp radiation, left upper   . Local  recurrence of lung cancer dx'd 07/2013    rt thoracotomy chemo comp 12/2013    ALLERGIES:  is allergic to azithromycin and tussionex pennkinetic er.  MEDICATIONS:  Current Outpatient Prescriptions  Medication Sig Dispense Refill  . BESIVANCE 0.6 % SUSP     . co-enzyme Q-10 30 MG capsule Take 30 mg by mouth 3 (three) times daily.    . DUREZOL 0.05 % EMUL     . Fluticasone Furoate-Vilanterol (BREO ELLIPTA) 100-25 MCG/INH AEPB Inhale 1 puff into the lungs daily. 1 each 5  . Multiple Vitamins-Minerals (WOMENS 50+ MULTI VITAMIN/MIN PO) Take 1 each by mouth daily.    . Probiotic Product (PROBIOTIC DAILY PO) Take 1 each by mouth daily.    Marland Kitchen PROLENSA 0.07 % SOLN     . fluticasone (FLONASE) 50 MCG/ACT nasal spray Place 2 sprays into both nostrils daily. (Patient not taking: Reported on 08/07/2014) 16 g 4  . loratadine (CLARITIN) 10 MG tablet Take 1 tablet (10 mg total) by mouth daily. (Patient not taking: Reported on 08/07/2014)     No current facility-administered medications for this visit.    SURGICAL HISTORY:  Past Surgical History  Procedure Laterality Date  . Neck surgery  1980's  . Dilation and curettage of uterus    . Uterine fibroid surgery  2012  . Bronchoscopy  2011  . Thoracotomy Right 09/21/2013    Procedure: THORACOTOMY MAJOR;  Surgeon: Gaye Pollack, MD;  Location: [facility] OR;  Service: Thoracic;  Laterality: Right;  . Wedge resection  Right 09/21/2013    Procedure: RIGHT UPPER LOBE WEDGE RESECTION;  Surgeon: Gaye Pollack, MD;  Location: [facility] OR;  Service: Thoracic;  Laterality: Right;    REVIEW OF SYSTEMS:  Constitutional: negative Eyes: negative Ears, nose, mouth, throat, and face: negative Respiratory: positive for pleurisy/chest pain Cardiovascular: negative Gastrointestinal: negative Genitourinary:negative Integument/breast: negative Hematologic/lymphatic: negative Musculoskeletal:negative Neurological: negative Behavioral/Psych: negative Endocrine:  negative Allergic/Immunologic: negative   PHYSICAL EXAMINATION: General appearance: alert, cooperative and no distress Head: Normocephalic, without obvious abnormality, atraumatic Neck: no adenopathy Lymph nodes: Cervical, supraclavicular, and axillary nodes normal. Resp: clear to auscultation bilaterally Back: symmetric, no curvature. ROM normal. No CVA tenderness. Cardio: regular rate and rhythm, S1, S2 normal, no murmur, click, rub or gallop GI: soft, non-tender; bowel sounds normal; no masses,  no organomegaly Extremities: extremities normal, atraumatic, no cyanosis or edema Neurologic: Alert and oriented X 3, normal strength and tone. Normal symmetric reflexes. Normal coordination and gait  ECOG PERFORMANCE STATUS: 0 - Asymptomatic  Blood pressure 151/65, pulse 64, temperature 97.7 F (36.5 C), temperature source Oral, resp. rate 18, height '5\' 2"'$  (1.575 m), weight 136 lb 11.2 oz (62.007 kg), SpO2 99 %.  LABORATORY DATA: Lab Results  Component Value Date   WBC 4.4 07/30/2014   HGB 12.0 07/30/2014   HCT 36.5 07/30/2014   MCV 94.1 07/30/2014   PLT 168 07/30/2014      Chemistry      Component Value Date/Time   NA 143 07/30/2014 0915   NA 137 11/06/2013 0906   NA 140 08/05/2011 0823   K 4.0 07/30/2014 0915   K 4.2 11/06/2013 0906   K 4.2 08/05/2011 0823   CL 102 11/06/2013 0906   CL 103 08/03/2012 0842   CL 98 08/05/2011 0823   CO2 30* 07/30/2014 0915   CO2 27 11/06/2013 0906   CO2 30 08/05/2011 0823   BUN 14.1 07/30/2014 0915   BUN 15 11/06/2013 0906   BUN 13 08/05/2011 0823   CREATININE 0.9 07/30/2014 0915   CREATININE 0.76 11/06/2013 0906   CREATININE 1.0 08/05/2011 0823      Component Value Date/Time   CALCIUM 10.4 07/30/2014 0915   CALCIUM 10.4 11/06/2013 0906   CALCIUM 9.4 08/05/2011 0823   ALKPHOS 77 07/30/2014 0915   ALKPHOS 112 11/06/2013 0906   ALKPHOS 69 08/05/2011 0823   AST 16 07/30/2014 0915   AST 13 11/06/2013 0906   AST 19 08/05/2011 0823    ALT 10 07/30/2014 0915   ALT 11 11/06/2013 0906   ALT 16 08/05/2011 0823   BILITOT 0.41 07/30/2014 0915   BILITOT 1.3* 11/06/2013 0906   BILITOT 0.70 08/05/2011 0823       RADIOGRAPHIC STUDIES: Ct Chest W Contrast  07/30/2014   CLINICAL DATA:  Patient with history of lung cancer diagnosed in 2011. Recurrence in 07/2013. Status post right thoracotomy in 2015.  EXAM: CT CHEST WITH CONTRAST  TECHNIQUE: Multidetector CT imaging of the chest was performed during intravenous contrast administration.  CONTRAST:  69m OMNIPAQUE IOHEXOL 300 MG/ML  SOLN  COMPARISON:  Chest CT 04/23/2014; 01/22/2014  FINDINGS: Mediastinum/Nodes: No enlarged axillary, mediastinal or hilar lymphadenopathy. There is leftward deviation of the mediastinum. Normal heart size. No pericardial effusion.  Lungs/Pleura: Central airways are patent. Re- demonstrated radiation fibrosis and volume loss within the medial left hemi thorax. Centrilobular emphysematous change re- demonstrated. Stable scattered pulmonary nodules within the right lower lobe, measuring up to 4 mm (image 37; series 5). Stable 3 mm right upper lobe  pulmonary nodule (image 26; series 602). Stable adjacent 2 mm right upper lobe pulmonary nodule (image 32; series 602). Stable possible thickening adjacent to an area of emphysema within the right upper lobe (image 47; series 602). No pleural effusion or pneumothorax.  Upper abdomen: Visualization of the upper abdomen demonstrates free intraperitoneal air. Liver is normal in size and contour. Stable 2 cm low-attenuation lesion with peripheral contrast enhancement in the left hepatic lobe (image 48; series 2), suggestive of hemangioma. Unchanged sub cm exophytic lesion superior pole right kidney, too small to definitively characterize. Additional too small to characterize low-attenuation lesion superior pole left kidney.  Musculoskeletal: No aggressive or acute appearing osseous lesions. Probable old right lateral fifth rib  fracture (image 35; series 2).  IMPRESSION: 1. Free intraperitoneal air demonstrated within the upper abdomen. In the absence of recent intervention, recommend correlation with abdominal pelvic CT. 2. Stable post radiation scarring and volume loss within the medial left hemi thorax without evidence for locally recurrent or metastatic disease. 3. No significant interval change in right upper and right lower lobe pulmonary nodules. Critical Value/emergent results were called by telephone at the time of interpretation on 07/30/2014 at 11:29 am to Dr. Curt Bears , who verbally acknowledged these results.   Electronically Signed   By: Lovey Newcomer M.D.   On: 07/30/2014 11:31   ASSESSMENT AND PLAN: This is a very pleasant 69 years old white female with limited stage small cell lung cancer diagnosed in June of 2011 status post 4 cycles of systemic chemotherapy with carboplatin and etoposide concurrent with radiation followed by prophylactic cranial irradiation and has been observation since November 2007. She underwent right upper lobe wedge resection of the pulmonary nodule in June 2015 and the final pathology was consistent with recurrent small cell lung cancer. The patient was started on systemic chemotherapy with carboplatin and etoposide status post 4 cycles and tolerating her treatment fairly well. Her recent CT scan of the chest showed no evidence for disease progression.  The patient had chronic free intraperitoneal air demonstrated within the upper abdomen and this has been going on for several years now. It was investigated in the past by her gynecologist and no clear etiology was found. She is currently asymptomatic. I discussed the scan results with the patient. I recommended for her to continue on observation with repeat CT scan of the chest in 3 months for reevaluation of her disease. She was advised to call immediately if she has any concerning symptoms in the interval  All questions were  answered. The patient knows to call the clinic with any problems, questions or concerns. We can certainly see the patient much sooner if necessary.  Disclaimer: This note was dictated with voice recognition software. Similar sounding words can inadvertently be transcribed and may not be corrected upon review.

## 2014-08-07 NOTE — Progress Notes (Signed)
Assisted Claudia [name] and her husband Claudia [name] with completing their Advance Directives.  Notarized by Claudia Riley, LCSW.  Returned Geneticist, molecular and extra copies to pt/family, retaining one copy of each to delivery personally to medical records now.  [place], [place]

## 2014-09-24 ENCOUNTER — Other Ambulatory Visit: Payer: Self-pay

## 2014-10-25 ENCOUNTER — Telehealth: Payer: Self-pay | Admitting: Internal Medicine

## 2014-10-25 NOTE — Telephone Encounter (Signed)
pt called to r/s lab done....pt ok and aware

## 2014-11-01 ENCOUNTER — Other Ambulatory Visit: Payer: Medicare Other

## 2014-11-05 ENCOUNTER — Encounter (HOSPITAL_COMMUNITY): Payer: Self-pay

## 2014-11-05 ENCOUNTER — Other Ambulatory Visit (HOSPITAL_BASED_OUTPATIENT_CLINIC_OR_DEPARTMENT_OTHER): Payer: Medicare Other

## 2014-11-05 ENCOUNTER — Ambulatory Visit (HOSPITAL_COMMUNITY)
Admission: RE | Admit: 2014-11-05 | Discharge: 2014-11-05 | Disposition: A | Discharge status: Home / Self Care | Payer: Medicare Other | Source: Ambulatory Visit | Attending: Internal Medicine | Admitting: Internal Medicine

## 2014-11-05 ENCOUNTER — Telehealth: Payer: Self-pay | Admitting: Internal Medicine

## 2014-11-05 DIAGNOSIS — J439 Emphysema, unspecified: Secondary | ICD-10-CM | POA: Insufficient documentation

## 2014-11-05 DIAGNOSIS — C3491 Malignant neoplasm of unspecified part of right bronchus or lung: Secondary | ICD-10-CM

## 2014-11-05 DIAGNOSIS — R918 Other nonspecific abnormal finding of lung field: Secondary | ICD-10-CM | POA: Diagnosis not present

## 2014-11-05 DIAGNOSIS — C3411 Malignant neoplasm of upper lobe, right bronchus or lung: Secondary | ICD-10-CM

## 2014-11-05 LAB — COMPREHENSIVE METABOLIC PANEL (CC13)
ALT: 12 U/L (ref 0–55)
AST: 18 U/L (ref 5–34)
Albumin: 3.8 g/dL (ref 3.5–5.0)
Alkaline Phosphatase: 72 U/L (ref 40–150)
Anion Gap: 8 mEq/L (ref 3–11)
BUN: 12.1 mg/dL (ref 7.0–26.0)
CO2: 30 mEq/L — ABNORMAL HIGH (ref 22–29)
Calcium: 10.7 mg/dL — ABNORMAL HIGH (ref 8.4–10.4)
Chloride: 107 mEq/L (ref 98–109)
Creatinine: 1 mg/dL (ref 0.6–1.1)
EGFR: 58 mL/min/{1.73_m2} — ABNORMAL LOW (ref >90)
Glucose: 97 mg/dl (ref 70–140)
Potassium: 4.1 mEq/L (ref 3.5–5.1)
Sodium: 144 mEq/L (ref 136–145)
Total Bilirubin: 0.34 mg/dL (ref 0.20–1.20)
Total Protein: 6.8 g/dL (ref 6.4–8.3)

## 2014-11-05 LAB — CBC WITH DIFFERENTIAL/PLATELET
BASO%: 0.8 % (ref 0.0–2.0)
Basophils Absolute: 0 10*3/uL (ref 0.0–0.1)
EOS%: 2 % (ref 0.0–7.0)
Eosinophils Absolute: 0.1 10*3/uL (ref 0.0–0.5)
HCT: 36.3 % (ref 34.8–46.6)
HGB: 12.1 g/dL (ref 11.6–15.9)
LYMPH%: 12.8 % — ABNORMAL LOW (ref 14.0–49.7)
MCH: 31.3 pg (ref 25.1–34.0)
MCHC: 33.2 g/dL (ref 31.5–36.0)
MCV: 94.3 fL (ref 79.5–101.0)
MONO#: 0.8 10*3/uL (ref 0.1–0.9)
MONO%: 15.3 % — ABNORMAL HIGH (ref 0.0–14.0)
NEUT#: 3.5 10*3/uL (ref 1.5–6.5)
NEUT%: 69.1 % (ref 38.4–76.8)
Platelets: 163 10*3/uL (ref 145–400)
RBC: 3.86 10*6/uL (ref 3.70–5.45)
RDW: 14.5 % (ref 11.2–14.5)
WBC: 5 10*3/uL (ref 3.9–10.3)
lymph#: 0.6 10*3/uL — ABNORMAL LOW (ref 0.9–3.3)

## 2014-11-05 MED ORDER — IOHEXOL 300 MG/ML  SOLN
75.0000 mL | Freq: Once | INTRAMUSCULAR | Status: AC | PRN
  Administered 2014-11-05: 75 mL via INTRAVENOUS

## 2014-11-05 NOTE — Telephone Encounter (Signed)
Gave and printed appt sched and avs for pt

## 2014-11-06 ENCOUNTER — Encounter: Payer: Self-pay | Admitting: Emergency Medicine

## 2014-11-06 ENCOUNTER — Ambulatory Visit (INDEPENDENT_AMBULATORY_CARE_PROVIDER_SITE_OTHER): Payer: Medicare Other | Admitting: Emergency Medicine

## 2014-11-06 VITALS — BP 132/72 | HR 75 | Ht 62.0 in | Wt 138.4 lb

## 2014-11-06 DIAGNOSIS — Z23 Encounter for immunization: Secondary | ICD-10-CM

## 2014-11-06 DIAGNOSIS — J441 Chronic obstructive pulmonary disease with (acute) exacerbation: Secondary | ICD-10-CM | POA: Diagnosis not present

## 2014-11-06 DIAGNOSIS — C3491 Malignant neoplasm of unspecified part of right bronchus or lung: Secondary | ICD-10-CM | POA: Diagnosis not present

## 2014-11-06 NOTE — Patient Instructions (Addendum)
Keep albuterol available to use 2 puffs if needed for shortness of breath, or before significant exercise.  We will not start any every-day inhalers at this time.  Follow with Dr Lamonte Sakai in 6 months or sooner if you have any problems

## 2014-11-06 NOTE — Assessment & Plan Note (Signed)
CT scan performed 11/05/14 was stable without any evidence of recurrent disease. We'll continue to follow with Dr. Julien Nordmann

## 2014-11-06 NOTE — Assessment & Plan Note (Signed)
She has not been requiring any scheduled bronchodilator and she does not have a short acting beta agonist available. I will give her albuterol to use as needed. She will have the Prevnar 13 today.

## 2014-11-06 NOTE — Addendum Note (Signed)
Addended by: Levander Campion on: 11/06/2014 04:17 PM   Modules accepted: Orders

## 2014-11-06 NOTE — Progress Notes (Signed)
Subjective:    Patient ID: Claudia [name], female    DOB: 10-22-1945, 69 y.o.   MRN: [id number]  HPI 69 yo former smoker with a history of COPD and small cell lung cancer diagnosed by Dr. Arlyce Dice in June 2001 and treated by Dr. Earlie Server and Dr. Tammi Klippel. She was admitted to the hospital in early January 2013 for an exacerbation of COPD vs CAP. Her CT scan of the chest revealed no pulmonary embolism, radiation changes and scarring in the left upper lobe, stable pulmonary nodularity as detailed below, and some left sided loculated pleural fluid. She presents today as new consult. She denies any current breathing difficulty. She gets serial CT scans through Dr Earlie Server. She was weak after discharge, now feels at baseline. She usually walks daily, does yoga. She is able to exert.   ROV 10/28/11 -- follow up for dyspnea and presumed COPD based on an AE that happened in 1/13. Also with small cell lung CA, followed by Dr Julien Nordmann. Next CT scan in November. She denies any dyspnea, has a good exercise tolerance.  At this time she would like to defer PFT and BD's.   ROV 04/14/12 -- presumed COPD, hx SCLCA. Last CTscan was 02/05/12, stable. She has had a dry cough for about a week. Non-productive. She is having clear nasal drainage, bothers her in the winter. She ? Whether she may have had a URI. She has been Nyquil - didn;t take it last night, resulted in congestion, nasal gtt, cough. Some occasional eye pressure, no real HA. Occasional GERD. No new exposures, although it was damp during her last trip to beach house. This has happened in January before.   ROV 07/26/12 -- COPD, hx SCLCA (last CT scan 11/13). At last OV she was having trouble with PND and cough - tried [place], loratadine, fluticasone nasal spray. She is using these prn for now. Remains active on no BD's. She is due for a CT scan chest next month, to be followed by Dr Julien Nordmann.   ROV 08/03/13 -- COPD, hx SCLCA, PND and cough. Follows with Dr Julien Nordmann. Her CT  scan 5/1 shows stable nodules with a RUL nodule that looks a bit more lobulated and slightly larger to 63m. She is going to have a PET scan to risk stratify the lesion. She feels well, no problems. [place] She has some SOB when walking up hill.   ROV 11/06/14 -- follow-up visit for COPD and small cell lung cancer that's been treated by Dr. MJulien Nordmann Underwent  a repeat CT scan of the chest that I have reviewed on 11/05/14, shows stable radiation changes and stable small scattered right-sided pulmonary nodules without any evidence of recurrent cancer. She is currently doing well. Breathing is good - walks frequently. No wheeze or cough. She has fluticasone nasal spray available to use prn, loratadine as well. She was started briefly on Breo but is not using now.  She doesn't have a SABA. I reviewed PFT from 08/2013, shows severe AFL, borderline BD response.        Objective:   Physical Exam Filed Vitals:   11/06/14 1540  BP: 132/72  Pulse: 75  Height: '5\' 2"'$  (1.575 m)  Weight: 138 lb 6.4 oz (62.778 kg)  SpO2: 97%   Gen: Pleasant, well-nourished, in no distress,  normal affect  ENT: No lesions,  mouth clear,  oropharynx clear, no postnasal drip  Neck: No JVD, no TMG, no carotid bruits  Lungs: No use  of accessory muscles,   Cardiovascular: RRR, heart sounds normal, no murmur or gallops, no peripheral edema  Musculoskeletal: No deformities, no cyanosis or clubbing  Neuro: alert, non focal  11/05/14 --  COMPARISON: CT scans from 07/30/2014 and 04/23/2014  FINDINGS: Chest wall: No breast masses, supraclavicular or axillary lymphadenopathy. The thyroid gland is normal. The bony thorax is intact. No destructive bone lesions or spinal canal compromise. Stable sclerotic change involving the fifth anterolateral rib on the right which may be related to heel fracture.  Mediastinum: The heart is normal in size. No pericardial effusion. The aorta is normal in caliber. No  dissection. No mediastinal or hilar mass or adenopathy. Mild diffuse wall thickening of the midesophagus could be due to radiation change.  Lungs/pleura: Stable dense radiation fibrosis involving the left paramediastinal long. Stable underlying emphysematous changes. No CT findings suspicious for recurrent tumor or new metastatic disease. Stable small scattered pulmonary nodules.  Upper abdomen: Large amount of free intraperitoneal air is again demonstrated. This is been a chronic finding in this patient and I discussed this with Dr. Julien Nordmann.  IMPRESSION: 1. Stable radiation changes involving the left hemi thorax. No findings suspicious for recurrent tumor or adenopathy. 2. Stable small scattered pulmonary nodules in the right lung. 3. Persistent free intraperitoneal air as described above. 4. Stable emphysematous changes.    Assessment & Plan:  COPD (chronic obstructive pulmonary disease) She has not been requiring any scheduled bronchodilator and she does not have a short acting beta agonist available. I will give her albuterol to use as needed. She will have the Prevnar 13 today.   Small cell lung cancer CT scan performed 11/05/14 was stable without any evidence of recurrent disease. We'll continue to follow with Dr. Julien Nordmann

## 2014-11-08 ENCOUNTER — Ambulatory Visit: Payer: Medicare Other | Admitting: Internal Medicine

## 2014-11-20 ENCOUNTER — Encounter: Payer: Self-pay | Admitting: Internal Medicine

## 2014-11-20 ENCOUNTER — Telehealth: Payer: Self-pay | Admitting: Internal Medicine

## 2014-11-20 ENCOUNTER — Ambulatory Visit (HOSPITAL_BASED_OUTPATIENT_CLINIC_OR_DEPARTMENT_OTHER): Payer: Medicare Other | Admitting: Internal Medicine

## 2014-11-20 VITALS — BP 132/60 | HR 84 | Temp 97.7°F | Resp 17 | Ht 62.0 in | Wt 140.6 lb

## 2014-11-20 DIAGNOSIS — C3411 Malignant neoplasm of upper lobe, right bronchus or lung: Secondary | ICD-10-CM

## 2014-11-20 DIAGNOSIS — C3491 Malignant neoplasm of unspecified part of right bronchus or lung: Secondary | ICD-10-CM

## 2014-11-20 NOTE — Progress Notes (Signed)
[place] [phone]   Fax:(336) [place], [place] [address]  PRINCIPAL DIAGNOSIS: recurrent small cell lung cancer initially diagnosed as Limited stage small cell lung cancer in June 2011, with recurrence in June 2015.   PRIOR THERAPY:  1. Status post 4 cycles of systemic chemotherapy with carboplatin and etoposide. Last dose was given 11/14/2009. This was concurrent with radiotherapy. 2. Status post prophylactic cranial irradiation completed January 28, 2010. 3. Status post wedge resection of the right upper lobe lung nodule under the care of Dr. Cyndia Bent on 09/21/2013 and the final pathology was consistent with small cell lung cancer. 4. systemic chemotherapy with carboplatin for AUC of 5 on day 1 and etoposide 120 mg/M2 on days 1, 2 and 3 with Neulasta support on day 4 every 3 weeks. Status post 4 cycles. First dose on 10/30/2013.  CURRENT THERAPY: Observation.  INTERVAL HISTORY: Claudia [name] 69 y.o. female returns to the clinic today for followup visit. She has been observation for the last 9 months. The patient is feeling fine today with no specific complaints. She just came back from [place] to the [place]. The patient denied having any significant nausea or vomiting, no fever or chills. She denied having any significant shortness of breath, cough or hemoptysis. She denied having any significant weight loss or night sweats. She had repeat CT scan of the chest performed recently and she is here for evaluation and discussion of her scan results.  MEDICAL HISTORY: Past Medical History  Diagnosis Date  . COPD (chronic obstructive pulmonary disease)   . Anemia   . Bradycardia     mild,may be due to beta blocker therapy  . Lymphocytic colitis   . GERD (gastroesophageal reflux disease)     otc  . Arthritis   . Pneumonia   . Lung cancer 2010    Dr. Julien Nordmann, finished  chemo, sp radiation, left upper   . Local recurrence of lung cancer dx'd 07/2013    rt thoracotomy chemo comp 12/2013    ALLERGIES:  is allergic to azithromycin and tussionex pennkinetic er.  MEDICATIONS:  Current Outpatient Prescriptions  Medication Sig Dispense Refill  . co-enzyme Q-10 30 MG capsule Take 30 mg by mouth daily.    . fluticasone (FLONASE) 50 MCG/ACT nasal spray Place 2 sprays into both nostrils daily. 16 g 4  . Fluticasone Furoate-Vilanterol (BREO ELLIPTA) 100-25 MCG/INH AEPB Inhale 1 puff into the lungs daily. 1 each 5  . loratadine (CLARITIN) 10 MG tablet Take 1 tablet (10 mg total) by mouth daily.    . Multiple Vitamins-Minerals (WOMENS 50+ MULTI VITAMIN/MIN PO) Take 1 each by mouth daily.    . Probiotic Product (PROBIOTIC DAILY PO) Take 1 each by mouth daily.    Marland Kitchen PROLENSA 0.07 % SOLN 1 DROP IN LEFT EYE DAILY START THIS DROP 1 DAY BEFORE SURGERY  0   No current facility-administered medications for this visit.    SURGICAL HISTORY:  Past Surgical History  Procedure Laterality Date  . Neck surgery  1980's  . Dilation and curettage of uterus    . Uterine fibroid surgery  2012  . Bronchoscopy  2011  . Thoracotomy Right 09/21/2013    Procedure: THORACOTOMY MAJOR;  Surgeon: Gaye Pollack, MD;  Location: [facility] OR;  Service: Thoracic;  Laterality: Right;  . Wedge resection Right 09/21/2013    Procedure: RIGHT UPPER LOBE WEDGE RESECTION;  Surgeon: Gaye Pollack, MD;  Location: [facility] OR;  Service: Thoracic;  Laterality: Right;    REVIEW OF SYSTEMS:  Constitutional: negative Eyes: negative Ears, nose, mouth, throat, and face: negative Respiratory: positive for pleurisy/chest pain Cardiovascular: negative Gastrointestinal: negative Genitourinary:negative Integument/breast: negative Hematologic/lymphatic: negative Musculoskeletal:negative Neurological: negative Behavioral/Psych: negative Endocrine: negative Allergic/Immunologic: negative   PHYSICAL EXAMINATION:  General appearance: alert, cooperative and no distress Head: Normocephalic, without obvious abnormality, atraumatic Neck: no adenopathy Lymph nodes: Cervical, supraclavicular, and axillary nodes normal. Resp: clear to auscultation bilaterally Back: symmetric, no curvature. ROM normal. No CVA tenderness. Cardio: regular rate and rhythm, S1, S2 normal, no murmur, click, rub or gallop GI: soft, non-tender; bowel sounds normal; no masses,  no organomegaly Extremities: extremities normal, atraumatic, no cyanosis or edema Neurologic: Alert and oriented X 3, normal strength and tone. Normal symmetric reflexes. Normal coordination and gait  ECOG PERFORMANCE STATUS: 0 - Asymptomatic  Blood pressure 132/60, pulse 84, temperature 97.7 F (36.5 C), temperature source Oral, resp. rate 17, height '5\' 2"'$  (1.575 m), weight 140 lb 9.6 oz (63.776 kg), SpO2 99 %.  LABORATORY DATA: Lab Results  Component Value Date   WBC 5.0 11/05/2014   HGB 12.1 11/05/2014   HCT 36.3 11/05/2014   MCV 94.3 11/05/2014   PLT 163 11/05/2014      Chemistry      Component Value Date/Time   NA 144 11/05/2014 0753   NA 137 11/06/2013 0906   NA 140 08/05/2011 0823   K 4.1 11/05/2014 0753   K 4.2 11/06/2013 0906   K 4.2 08/05/2011 0823   CL 102 11/06/2013 0906   CL 103 08/03/2012 0842   CL 98 08/05/2011 0823   CO2 30* 11/05/2014 0753   CO2 27 11/06/2013 0906   CO2 30 08/05/2011 0823   BUN 12.1 11/05/2014 0753   BUN 15 11/06/2013 0906   BUN 13 08/05/2011 0823   CREATININE 1.0 11/05/2014 0753   CREATININE 0.76 11/06/2013 0906   CREATININE 1.0 08/05/2011 0823      Component Value Date/Time   CALCIUM 10.7* 11/05/2014 0753   CALCIUM 10.4 11/06/2013 0906   CALCIUM 9.4 08/05/2011 0823   ALKPHOS 72 11/05/2014 0753   ALKPHOS 112 11/06/2013 0906   ALKPHOS 69 08/05/2011 0823   AST 18 11/05/2014 0753   AST 13 11/06/2013 0906   AST 19 08/05/2011 0823   ALT 12 11/05/2014 0753   ALT 11 11/06/2013 0906   ALT 16  08/05/2011 0823   BILITOT 0.34 11/05/2014 0753   BILITOT 1.3* 11/06/2013 0906   BILITOT 0.70 08/05/2011 0823       RADIOGRAPHIC STUDIES: Ct Chest W Contrast  11/05/2014   CLINICAL DATA:  Restaging small cell lung cancer. Initial diagnosis 2011 with recurrence in 2015  EXAM: CT CHEST WITH CONTRAST  TECHNIQUE: Multidetector CT imaging of the chest was performed during intravenous contrast administration.  CONTRAST:  68m OMNIPAQUE IOHEXOL 300 MG/ML  SOLN  COMPARISON:  CT scans from 07/30/2014 and 04/23/2014  FINDINGS: Chest wall: No breast masses, supraclavicular or axillary lymphadenopathy. The thyroid gland is normal. The bony thorax is intact. No destructive bone lesions or spinal canal compromise. Stable sclerotic change involving the fifth anterolateral rib on the right which may be related to heel fracture.  Mediastinum: The heart is normal in size. No pericardial effusion. The aorta is normal in caliber. No dissection. No mediastinal or hilar mass or adenopathy. Mild diffuse wall thickening of the midesophagus could be due to radiation change.  Lungs/pleura: Stable dense radiation fibrosis involving the left paramediastinal long. Stable underlying emphysematous changes. No CT findings suspicious for recurrent tumor or new metastatic disease. Stable small scattered pulmonary nodules.  Upper abdomen: Large amount of free intraperitoneal air is again demonstrated. This is been a chronic finding in this patient and I discussed this with Dr. Julien Nordmann.  IMPRESSION: 1. Stable radiation changes involving the left hemi thorax. No findings suspicious for recurrent tumor or adenopathy. 2. Stable small scattered pulmonary nodules in the right lung. 3. Persistent free intraperitoneal air as described above. 4. Stable emphysematous changes.   Electronically Signed   By: Marijo Sanes M.D.   On: 11/05/2014 10:01   ASSESSMENT AND PLAN: This is a very pleasant 69 years old white female with limited stage small cell  lung cancer diagnosed in June of 2011 status post 4 cycles of systemic chemotherapy with carboplatin and etoposide concurrent with radiation followed by prophylactic cranial irradiation and has been observation since November 2007. She underwent right upper lobe wedge resection of the pulmonary nodule in June 2015 and the final pathology was consistent with recurrent small cell lung cancer. The patient was started on systemic chemotherapy with carboplatin and etoposide status post 4 cycles with no evidence for disease recurrence.  Her recent CT scan of the chest showed no evidence for disease progression.  The patient had chronic free intraperitoneal air demonstrated within the upper abdomen and this has been going on for several years now. It was investigated in the past by her gynecologist and no clear etiology was found. She is currently asymptomatic. I discussed the scan results with the patient. I recommended for her to continue on observation with repeat CT scan of the chest in 3 months for reevaluation of her disease. She was advised to call immediately if she has any concerning symptoms in the interval  All questions were answered. The patient knows to call the clinic with any problems, questions or concerns. We can certainly see the patient much sooner if necessary.  Disclaimer: This note was dictated with voice recognition software. Similar sounding words can inadvertently be transcribed and may not be corrected upon review.

## 2014-11-20 NOTE — Telephone Encounter (Signed)
Gave and printed appt sched and avs fo rpt; for NOV  °

## 2014-12-10 ENCOUNTER — Ambulatory Visit: Payer: Medicare Other | Admitting: Internal Medicine

## 2014-12-31 ENCOUNTER — Telehealth: Payer: Self-pay | Admitting: Internal Medicine

## 2014-12-31 MED ORDER — ALBUTEROL SULFATE HFA 108 (90 BASE) MCG/ACT IN AERS: 2.0000 | INHALATION_SPRAY | Freq: Four times a day (QID) | RESPIRATORY_TRACT | Status: DC | PRN

## 2014-12-31 NOTE — Telephone Encounter (Signed)
Called and spoke to pt. Pt requesting the inhaler RB prescribed at the last visit. Per last OV note in 10/2014:  Patient Instructions     Keep albuterol available to use 2 puffs if needed for shortness of breath, or before significant exercise.  We will not start any every-day inhalers at this time.  Follow with Dr Lamonte Sakai in 6 months or sooner if you have any problems    Albuterol hfa sent to preferred pharmacy. Pt verbalized understanding and denied any further questions or concerns at this time.

## 2015-02-19 ENCOUNTER — Encounter (HOSPITAL_COMMUNITY): Payer: Self-pay

## 2015-02-19 ENCOUNTER — Ambulatory Visit (HOSPITAL_COMMUNITY)
Admission: RE | Admit: 2015-02-19 | Discharge: 2015-02-19 | Disposition: A | Discharge status: Home / Self Care | Payer: Medicare Other | Source: Ambulatory Visit | Attending: Internal Medicine | Admitting: Internal Medicine

## 2015-02-19 ENCOUNTER — Other Ambulatory Visit (HOSPITAL_BASED_OUTPATIENT_CLINIC_OR_DEPARTMENT_OTHER): Payer: Medicare Other

## 2015-02-19 DIAGNOSIS — R918 Other nonspecific abnormal finding of lung field: Secondary | ICD-10-CM | POA: Insufficient documentation

## 2015-02-19 DIAGNOSIS — C3491 Malignant neoplasm of unspecified part of right bronchus or lung: Secondary | ICD-10-CM

## 2015-02-19 DIAGNOSIS — Z9889 Other specified postprocedural states: Secondary | ICD-10-CM | POA: Insufficient documentation

## 2015-02-19 LAB — CBC WITH DIFFERENTIAL/PLATELET
BASO%: 1 % (ref 0.0–2.0)
Basophils Absolute: 0.1 10*3/uL (ref 0.0–0.1)
EOS%: 2.6 % (ref 0.0–7.0)
Eosinophils Absolute: 0.1 10*3/uL (ref 0.0–0.5)
HCT: 36.6 % (ref 34.8–46.6)
HGB: 12.1 g/dL (ref 11.6–15.9)
LYMPH%: 10.4 % — ABNORMAL LOW (ref 14.0–49.7)
MCH: 31.1 pg (ref 25.1–34.0)
MCHC: 33.1 g/dL (ref 31.5–36.0)
MCV: 93.8 fL (ref 79.5–101.0)
MONO#: 0.8 10*3/uL (ref 0.1–0.9)
MONO%: 15 % — ABNORMAL HIGH (ref 0.0–14.0)
NEUT#: 3.8 10*3/uL (ref 1.5–6.5)
NEUT%: 71 % (ref 38.4–76.8)
Platelets: 152 10*3/uL (ref 145–400)
RBC: 3.9 10*6/uL (ref 3.70–5.45)
RDW: 14.9 % — ABNORMAL HIGH (ref 11.2–14.5)
WBC: 5.3 10*3/uL (ref 3.9–10.3)
lymph#: 0.6 10*3/uL — ABNORMAL LOW (ref 0.9–3.3)

## 2015-02-19 LAB — COMPREHENSIVE METABOLIC PANEL (CC13)
ALT: 9 U/L (ref 0–55)
AST: 18 U/L (ref 5–34)
Albumin: 3.8 g/dL (ref 3.5–5.0)
Alkaline Phosphatase: 70 U/L (ref 40–150)
Anion Gap: 7 mEq/L (ref 3–11)
BUN: 12.6 mg/dL (ref 7.0–26.0)
CO2: 28 mEq/L (ref 22–29)
Calcium: 9.9 mg/dL (ref 8.4–10.4)
Chloride: 107 mEq/L (ref 98–109)
Creatinine: 0.8 mg/dL (ref 0.6–1.1)
EGFR: 71 mL/min/{1.73_m2} — ABNORMAL LOW (ref >90)
Glucose: 89 mg/dl (ref 70–140)
Potassium: 4 mEq/L (ref 3.5–5.1)
Sodium: 142 mEq/L (ref 136–145)
Total Bilirubin: 0.58 mg/dL (ref 0.20–1.20)
Total Protein: 6.8 g/dL (ref 6.4–8.3)

## 2015-02-19 MED ORDER — IOHEXOL 300 MG/ML  SOLN
75.0000 mL | Freq: Once | INTRAMUSCULAR | Status: AC | PRN
  Administered 2015-02-19: 75 mL via INTRAVENOUS

## 2015-02-26 ENCOUNTER — Ambulatory Visit (HOSPITAL_BASED_OUTPATIENT_CLINIC_OR_DEPARTMENT_OTHER): Payer: Medicare Other | Admitting: Internal Medicine

## 2015-02-26 ENCOUNTER — Telehealth: Payer: Self-pay | Admitting: Internal Medicine

## 2015-02-26 ENCOUNTER — Encounter: Payer: Self-pay | Admitting: Internal Medicine

## 2015-02-26 VITALS — BP 123/67 | HR 78 | Temp 98.3°F | Resp 18 | Ht 62.0 in | Wt 141.5 lb

## 2015-02-26 DIAGNOSIS — C3411 Malignant neoplasm of upper lobe, right bronchus or lung: Secondary | ICD-10-CM | POA: Diagnosis not present

## 2015-02-26 DIAGNOSIS — C3491 Malignant neoplasm of unspecified part of right bronchus or lung: Secondary | ICD-10-CM

## 2015-02-26 NOTE — Progress Notes (Signed)
[place] [phone]   Fax:(336) [place], [place] [address]  PRINCIPAL DIAGNOSIS: recurrent small cell lung cancer initially diagnosed as Limited stage small cell lung cancer in June 2011, with recurrence in June 2015.   PRIOR THERAPY:  1. Status post 4 cycles of systemic chemotherapy with carboplatin and etoposide. Last dose was given 11/14/2009. This was concurrent with radiotherapy. 2. Status post prophylactic cranial irradiation completed January 28, 2010. 3. Status post wedge resection of the right upper lobe lung nodule under the care of Dr. Cyndia Bent on 09/21/2013 and the final pathology was consistent with small cell lung cancer. 4. systemic chemotherapy with carboplatin for AUC of 5 on day 1 and etoposide 120 mg/M2 on days 1, 2 and 3 with Neulasta support on day 4 every 3 weeks. Status post 4 cycles. First dose on 10/30/2013.  CURRENT THERAPY: Observation.  INTERVAL HISTORY: Claudia [name] 69 y.o. female returns to the clinic today for followup visit accompanied by her husband. She has been observation for the last 15 months. The patient is feeling fine today with no specific complaints. The patient denied having any significant nausea or vomiting, no fever or chills. She denied having any significant shortness of breath, cough or hemoptysis. She denied having any significant weight loss or night sweats. She had repeat CT scan of the chest performed recently and she is here for evaluation and discussion of her scan results.  MEDICAL HISTORY: Past Medical History  Diagnosis Date  . COPD (chronic obstructive pulmonary disease) ([place])   . Anemia   . Bradycardia     mild,may be due to beta blocker therapy  . Lymphocytic colitis   . GERD (gastroesophageal reflux disease)     otc  . Arthritis   . Pneumonia   . Lung cancer [facility] 2010    Dr. Julien Nordmann, finished chemo, sp  radiation, left upper   . Local recurrence of lung cancer ([place]) dx'd 07/2013    rt thoracotomy chemo comp 12/2013    ALLERGIES:  is allergic to azithromycin and tussionex pennkinetic er.  MEDICATIONS:  Current Outpatient Prescriptions  Medication Sig Dispense Refill  . co-enzyme Q-10 30 MG capsule Take 30 mg by mouth daily.    . fluticasone (FLONASE) 50 MCG/ACT nasal spray Place 2 sprays into both nostrils daily. 16 g 4  . Fluticasone Furoate-Vilanterol (BREO ELLIPTA) 100-25 MCG/INH AEPB Inhale 1 puff into the lungs daily. 1 each 5  . Multiple Vitamins-Minerals (WOMENS 50+ MULTI VITAMIN/MIN PO) Take 1 each by mouth daily.    . Probiotic Product (PROBIOTIC DAILY PO) Take 1 each by mouth daily.    Marland Kitchen albuterol (PROVENTIL HFA;VENTOLIN HFA) 108 (90 BASE) MCG/ACT inhaler Inhale 2 puffs into the lungs every 6 (six) hours as needed for wheezing or shortness of breath. (Patient not taking: Reported on 02/26/2015) 1 Inhaler 6  . loratadine (CLARITIN) 10 MG tablet Take 1 tablet (10 mg total) by mouth daily. (Patient not taking: Reported on 02/26/2015)     No current facility-administered medications for this visit.    SURGICAL HISTORY:  Past Surgical History  Procedure Laterality Date  . Neck surgery  1980's  . Dilation and curettage of uterus    . Uterine fibroid surgery  2012  . Bronchoscopy  2011  . Thoracotomy Right 09/21/2013    Procedure: THORACOTOMY MAJOR;  Surgeon: Gaye Pollack, MD;  Location: [place];  Service: Thoracic;  Laterality: Right;  . Wedge resection Right 09/21/2013    Procedure: RIGHT UPPER LOBE WEDGE RESECTION;  Surgeon: Gaye Pollack, MD;  Location: [facility] OR;  Service: Thoracic;  Laterality: Right;    REVIEW OF SYSTEMS:  Constitutional: negative Eyes: negative Ears, nose, mouth, throat, and face: negative Respiratory: positive for pleurisy/chest pain Cardiovascular: negative Gastrointestinal: negative Genitourinary:negative Integument/breast:  negative Hematologic/lymphatic: negative Musculoskeletal:negative Neurological: negative Behavioral/Psych: negative Endocrine: negative Allergic/Immunologic: negative   PHYSICAL EXAMINATION: General appearance: alert, cooperative and no distress Head: Normocephalic, without obvious abnormality, atraumatic Neck: no adenopathy Lymph nodes: Cervical, supraclavicular, and axillary nodes normal. Resp: clear to auscultation bilaterally Back: symmetric, no curvature. ROM normal. No CVA tenderness. Cardio: regular rate and rhythm, S1, S2 normal, no murmur, click, rub or gallop GI: soft, non-tender; bowel sounds normal; no masses,  no organomegaly Extremities: extremities normal, atraumatic, no cyanosis or edema Neurologic: Alert and oriented X 3, normal strength and tone. Normal symmetric reflexes. Normal coordination and gait  ECOG PERFORMANCE STATUS: 0 - Asymptomatic  Blood pressure 123/67, pulse 78, temperature 98.3 F (36.8 C), temperature source Oral, resp. rate 18, height '5\' 2"'$  (1.575 m), weight 141 lb 8 oz (64.184 kg), SpO2 100 %.  LABORATORY DATA: Lab Results  Component Value Date   WBC 5.3 02/19/2015   HGB 12.1 02/19/2015   HCT 36.6 02/19/2015   MCV 93.8 02/19/2015   PLT 152 02/19/2015      Chemistry      Component Value Date/Time   NA 142 02/19/2015 0759   NA 137 11/06/2013 0906   NA 140 08/05/2011 0823   K 4.0 02/19/2015 0759   K 4.2 11/06/2013 0906   K 4.2 08/05/2011 0823   CL 102 11/06/2013 0906   CL 103 08/03/2012 0842   CL 98 08/05/2011 0823   CO2 28 02/19/2015 0759   CO2 27 11/06/2013 0906   CO2 30 08/05/2011 0823   BUN 12.6 02/19/2015 0759   BUN 15 11/06/2013 0906   BUN 13 08/05/2011 0823   CREATININE 0.8 02/19/2015 0759   CREATININE 0.76 11/06/2013 0906   CREATININE 1.0 08/05/2011 0823      Component Value Date/Time   CALCIUM 9.9 02/19/2015 0759   CALCIUM 10.4 11/06/2013 0906   CALCIUM 9.4 08/05/2011 0823   ALKPHOS 70 02/19/2015 0759   ALKPHOS  112 11/06/2013 0906   ALKPHOS 69 08/05/2011 0823   AST 18 02/19/2015 0759   AST 13 11/06/2013 0906   AST 19 08/05/2011 0823   ALT 9 02/19/2015 0759   ALT 11 11/06/2013 0906   ALT 16 08/05/2011 0823   BILITOT 0.58 02/19/2015 0759   BILITOT 1.3* 11/06/2013 0906   BILITOT 0.70 08/05/2011 0823       RADIOGRAPHIC STUDIES: Ct Chest W Contrast  02/19/2015  CLINICAL DATA:  Right lung cancer diagnosed 2011 and 2015, XRT and chemotherapy complete, known pneumoperitoneum EXAM: CT CHEST WITH CONTRAST TECHNIQUE: Multidetector CT imaging of the chest was performed during intravenous contrast administration. CONTRAST:  56m OMNIPAQUE IOHEXOL 300 MG/ML  SOLN COMPARISON:  11/05/2014 FINDINGS: Mediastinum/Nodes: The heart is normal in size. Small pericardial effusion. Atherosclerotic calcifications aortic arch. No suspicious mediastinal, hilar, or axillary lymphadenopathy. Visualized thyroid is unremarkable. Lungs/Pleura: Radiation changes in the posterior left upper lobe/ paramediastinal region. Postsurgical changes in the medial right hemithorax. Stable 4 mm nodule in the right lower lobe (series 5/ image 34), unchanged since at least 2013, benign. Minimal nodularity in the medial right lower lobe (series 5/image 28), also  unchanged. No suspicious pulmonary nodules. Underlying moderate centrilobular and paraseptal emphysematous changes, upper lobe predominant. No pleural effusion or pneumothorax. Upper abdomen: Visualized upper abdomen is notable for suspected pneumatosis involving the splenic flexure with spur scattered small foci of pneumoperitoneum. The appearance is similar (although less pronounced) than the prior CT. Musculoskeletal: Mild degenerative changes of the visualized thoracolumbar spine. IMPRESSION: Postsurgical changes in the right hemithorax. Radiation changes in the left hemithorax. No evidence of recurrent or metastatic disease. Suspected pneumatosis along the splenic flexure with scattered  small foci of pneumoperitoneum, persistent although improved from prior studies, chronic. Electronically Signed   By: Julian Hy M.D.   On: 02/19/2015 09:59   ASSESSMENT AND PLAN: This is a very pleasant 69 years old white female with limited stage small cell lung cancer diagnosed in June of 2011 status post 4 cycles of systemic chemotherapy with carboplatin and etoposide concurrent with radiation followed by prophylactic cranial irradiation and has been observation since November 2007. She underwent right upper lobe wedge resection of the pulmonary nodule in June 2015 and the final pathology was consistent with recurrent small cell lung cancer. The patient was started on systemic chemotherapy with carboplatin and etoposide status post 4 cycles with no evidence for disease recurrence.  Her recent CT scan of the chest showed no evidence for disease progression. I discussed the scan results with the patient and her husband. I recommended for her to continue on observation with repeat CT scan of the chest in 4 months.  The patient had chronic free intraperitoneal air demonstrated within the upper abdomen and this has been going on for several years now. It was investigated in the past by her gynecologist and no clear etiology was found. She is currently asymptomatic. She was advised to call immediately if she has any concerning symptoms in the interval  All questions were answered. The patient knows to call the clinic with any problems, questions or concerns. We can certainly see the patient much sooner if necessary.  Disclaimer: This note was dictated with voice recognition software. Similar sounding words can inadvertently be transcribed and may not be corrected upon review.

## 2015-02-26 NOTE — Telephone Encounter (Signed)
per pof to sch pt trmt-sent MW email to sch trmt-will call pt after reply

## 2015-04-30 ENCOUNTER — Ambulatory Visit (INDEPENDENT_AMBULATORY_CARE_PROVIDER_SITE_OTHER): Payer: Medicare Other | Admitting: Emergency Medicine

## 2015-04-30 ENCOUNTER — Encounter: Payer: Self-pay | Admitting: Emergency Medicine

## 2015-04-30 VITALS — BP 122/70 | HR 97 | Wt 143.0 lb

## 2015-04-30 DIAGNOSIS — J209 Acute bronchitis, unspecified: Secondary | ICD-10-CM | POA: Diagnosis not present

## 2015-04-30 DIAGNOSIS — J441 Chronic obstructive pulmonary disease with (acute) exacerbation: Secondary | ICD-10-CM

## 2015-04-30 DIAGNOSIS — C3491 Malignant neoplasm of unspecified part of right bronchus or lung: Secondary | ICD-10-CM | POA: Diagnosis not present

## 2015-04-30 MED ORDER — LEVOFLOXACIN 500 MG PO TABS: 500.0000 mg | ORAL_TABLET | Freq: Every day | ORAL | Status: DC

## 2015-04-30 NOTE — Assessment & Plan Note (Signed)
Continue albuterol when necessary. She has not required in likely does not require scheduled bronchodilators at this time.Marland Kitchen

## 2015-04-30 NOTE — Patient Instructions (Addendum)
Please take levaquin '500mg'$  daily for 7 days.  Please continue fluticasone nasal spray daily Continue your cough medicine as you have been taking it for symptoms.  Take albuterol 2 puffs up to every 4 hours if needed for shortness of breath.

## 2015-04-30 NOTE — Progress Notes (Signed)
Subjective:    Patient ID: Claudia [name], female    DOB: 08/02/1945, 70 y.o.   MRN: [id number]  HPI 70 yo former smoker with a history of COPD and small cell lung cancer diagnosed by Dr. Arlyce Dice in June 2001 and treated by Dr. Earlie Server and Dr. Tammi Klippel. She was admitted to the hospital in early January 2013 for an exacerbation of COPD vs CAP. Her CT scan of the chest revealed no pulmonary embolism, radiation changes and scarring in the left upper lobe, stable pulmonary nodularity as detailed below, and some left sided loculated pleural fluid. She presents today as new consult. She denies any current breathing difficulty. She gets serial CT scans through Dr Earlie Server. She was weak after discharge, now feels at baseline. She usually walks daily, does yoga. She is able to exert.   ROV 10/28/11 -- follow up for dyspnea and presumed COPD based on an AE that happened in 1/13. Also with small cell lung CA, followed by Dr Julien Nordmann. Next CT scan in November. She denies any dyspnea, has a good exercise tolerance.  At this time she would like to defer PFT and BD's.   ROV 04/14/12 -- presumed COPD, hx SCLCA. Last CTscan was 02/05/12, stable. She has had a dry cough for about a week. Non-productive. She is having clear nasal drainage, bothers her in the winter. She ? Whether she may have had a URI. She has been Nyquil - didn;t take it last night, resulted in congestion, nasal gtt, cough. Some occasional eye pressure, no real HA. Occasional GERD. No new exposures, although it was damp during her last trip to beach house. This has happened in January before.   ROV 07/26/12 -- COPD, hx SCLCA (last CT scan 11/13). At last OV she was having trouble with PND and cough - tried [place], loratadine, fluticasone nasal spray. She is using these prn for now. Remains active on no BD's. She is due for a CT scan chest next month, to be followed by Dr Julien Nordmann.   ROV 08/03/13 -- COPD, hx SCLCA, PND and cough. Follows with Dr Julien Nordmann. Her CT  scan 5/1 shows stable nodules with a RUL nodule that looks a bit more lobulated and slightly larger to 1m. She is going to have a PET scan to risk stratify the lesion. She feels well, no problems. She is doing well with [place] She has some SOB when walking up hill.   ROV 11/06/14 -- follow-up visit for COPD and small cell lung cancer that's been treated by Dr. MJulien Nordmann Underwent  a repeat CT scan of the chest that I have reviewed on 11/05/14, shows stable radiation changes and stable small scattered right-sided pulmonary nodules without any evidence of recurrent cancer. She is currently doing well. Breathing is good - walks frequently. No wheeze or cough. She has fluticasone nasal spray available to use prn, loratadine as well. She was started briefly on Breo but is not using now.  She doesn't have a SABA. I reviewed PFT from 08/2013, shows severe AFL, borderline BD response.   ROV1/31/17 -- follow-up visit for small cell lung cancer and COPD. Her most recent CT scan of the chest was performed on 02/19/15. I personally reviewed the scan. Her right lower lobe nodular changes are stable compared with prior scans only back to 2013 with some minimal nodularity in the medial right lower lobe also unchanged. There is no evidence of recurrent disease. She is dealing with URI that led to residual nasal gtt, hacking cough,  prod of dark green phlegm.  She is using flonase, not on loratadine.        Objective:   Physical Exam Filed Vitals:   04/30/15 1619  BP: 122/70  Pulse: 97  Weight: 143 lb (64.864 kg)  SpO2: 96%   Gen: Pleasant, well-nourished, in no distress,  normal affect  ENT: No lesions,  mouth clear,  oropharynx clear, no postnasal drip  Neck: No JVD, no TMG, no carotid bruits  Lungs: No use of accessory muscles,   Cardiovascular: RRR, heart sounds normal, no murmur or gallops, no peripheral edema  Musculoskeletal: No deformities, no cyanosis or clubbing  Neuro: alert, non focal  11/05/14 --   COMPARISON: CT scans from 07/30/2014 and 04/23/2014  FINDINGS: Chest wall: No breast masses, supraclavicular or axillary lymphadenopathy. The thyroid gland is normal. The bony thorax is intact. No destructive bone lesions or spinal canal compromise. Stable sclerotic change involving the fifth anterolateral rib on the right which may be related to heel fracture.  Mediastinum: The heart is normal in size. No pericardial effusion. The aorta is normal in caliber. No dissection. No mediastinal or hilar mass or adenopathy. Mild diffuse wall thickening of the midesophagus could be due to radiation change.  Lungs/pleura: Stable dense radiation fibrosis involving the left paramediastinal long. Stable underlying emphysematous changes. No CT findings suspicious for recurrent tumor or new metastatic disease. Stable small scattered pulmonary nodules.  Upper abdomen: Large amount of free intraperitoneal air is again demonstrated. This is been a chronic finding in this patient and I discussed this with Dr. Julien Nordmann.  IMPRESSION: 1. Stable radiation changes involving the left hemi thorax. No findings suspicious for recurrent tumor or adenopathy. 2. Stable small scattered pulmonary nodules in the right lung. 3. Persistent free intraperitoneal air as described above. 4. Stable emphysematous changes.    Assessment & Plan:  Acute bronchitis Please take levaquin '500mg'$  daily for 7 days.  Please continue fluticasone nasal spray daily Continue your cough medicine as you have been taking it for symptoms.  Take albuterol 2 puffs up to every 4 hours if needed for shortness of breath.  COPD (chronic obstructive pulmonary disease) Continue albuterol when necessary. She has not required in likely does not require scheduled bronchodilators at this time..   Small cell lung cancer Most recent CT scan of the chest has been stable. She has follow-up arranged with Dr. Julien Nordmann

## 2015-04-30 NOTE — Assessment & Plan Note (Signed)
Most recent CT scan of the chest has been stable. She has follow-up arranged with Dr. Julien Nordmann

## 2015-04-30 NOTE — Assessment & Plan Note (Signed)
Please take levaquin '500mg'$  daily for 7 days.  Please continue fluticasone nasal spray daily Continue your cough medicine as you have been taking it for symptoms.  Take albuterol 2 puffs up to every 4 hours if needed for shortness of breath.

## 2015-05-01 ENCOUNTER — Telehealth: Payer: Self-pay | Admitting: Emergency Medicine

## 2015-05-01 MED ORDER — FLUTICASONE PROPIONATE 50 MCG/ACT NA SUSP: 2.0000 | Freq: Every day | NASAL | Status: DC

## 2015-05-01 NOTE — Telephone Encounter (Signed)
Patient states that she saw Dr. Lamonte Sakai yesterday and was supposed to get a refill on her Flonase, but the pharmacy never received the refill.  Rx sent to pharmacy. Patient aware. Nothing further needed.

## 2015-05-09 ENCOUNTER — Ambulatory Visit: Payer: Medicare Other | Admitting: Emergency Medicine

## 2015-06-27 ENCOUNTER — Other Ambulatory Visit (HOSPITAL_BASED_OUTPATIENT_CLINIC_OR_DEPARTMENT_OTHER): Payer: Medicare Other

## 2015-06-27 ENCOUNTER — Ambulatory Visit (HOSPITAL_COMMUNITY)
Admission: RE | Admit: 2015-06-27 | Discharge: 2015-06-27 | Disposition: A | Discharge status: Home / Self Care | Payer: Medicare Other | Source: Ambulatory Visit | Attending: Internal Medicine | Admitting: Internal Medicine

## 2015-06-27 ENCOUNTER — Other Ambulatory Visit: Payer: Self-pay | Admitting: Medical Oncology

## 2015-06-27 ENCOUNTER — Encounter (HOSPITAL_COMMUNITY): Payer: Self-pay

## 2015-06-27 DIAGNOSIS — K668 Other specified disorders of peritoneum: Secondary | ICD-10-CM | POA: Insufficient documentation

## 2015-06-27 DIAGNOSIS — Z923 Personal history of irradiation: Secondary | ICD-10-CM | POA: Insufficient documentation

## 2015-06-27 DIAGNOSIS — C3491 Malignant neoplasm of unspecified part of right bronchus or lung: Secondary | ICD-10-CM

## 2015-06-27 DIAGNOSIS — R911 Solitary pulmonary nodule: Secondary | ICD-10-CM | POA: Insufficient documentation

## 2015-06-27 DIAGNOSIS — C3411 Malignant neoplasm of upper lobe, right bronchus or lung: Secondary | ICD-10-CM | POA: Diagnosis not present

## 2015-06-27 LAB — COMPREHENSIVE METABOLIC PANEL
ALT: 10 U/L (ref 0–55)
AST: 17 U/L (ref 5–34)
Albumin: 3.8 g/dL (ref 3.5–5.0)
Alkaline Phosphatase: 77 U/L (ref 40–150)
Anion Gap: 7 mEq/L (ref 3–11)
BUN: 12.2 mg/dL (ref 7.0–26.0)
CO2: 29 mEq/L (ref 22–29)
Calcium: 10.3 mg/dL (ref 8.4–10.4)
Chloride: 107 mEq/L (ref 98–109)
Creatinine: 0.9 mg/dL (ref 0.6–1.1)
EGFR: 65 mL/min/{1.73_m2} — ABNORMAL LOW (ref >90)
Glucose: 91 mg/dl (ref 70–140)
Potassium: 3.8 mEq/L (ref 3.5–5.1)
Sodium: 142 mEq/L (ref 136–145)
Total Bilirubin: 0.5 mg/dL (ref 0.20–1.20)
Total Protein: 7.2 g/dL (ref 6.4–8.3)

## 2015-06-27 LAB — CBC WITH DIFFERENTIAL/PLATELET
BASO%: 1.1 % (ref 0.0–2.0)
Basophils Absolute: 0.1 10*3/uL (ref 0.0–0.1)
EOS%: 1.7 % (ref 0.0–7.0)
Eosinophils Absolute: 0.1 10*3/uL (ref 0.0–0.5)
HCT: 37.2 % (ref 34.8–46.6)
HGB: 12.2 g/dL (ref 11.6–15.9)
LYMPH%: 11.7 % — ABNORMAL LOW (ref 14.0–49.7)
MCH: 30.6 pg (ref 25.1–34.0)
MCHC: 32.8 g/dL (ref 31.5–36.0)
MCV: 93.1 fL (ref 79.5–101.0)
MONO#: 0.8 10*3/uL (ref 0.1–0.9)
MONO%: 15.4 % — ABNORMAL HIGH (ref 0.0–14.0)
NEUT#: 3.5 10*3/uL (ref 1.5–6.5)
NEUT%: 70.1 % (ref 38.4–76.8)
Platelets: 154 10*3/uL (ref 145–400)
RBC: 4 10*6/uL (ref 3.70–5.45)
RDW: 14.9 % — ABNORMAL HIGH (ref 11.2–14.5)
WBC: 4.9 10*3/uL (ref 3.9–10.3)
lymph#: 0.6 10*3/uL — ABNORMAL LOW (ref 0.9–3.3)

## 2015-06-27 MED ORDER — IOPAMIDOL (ISOVUE-300) INJECTION 61%
75.0000 mL | Freq: Once | INTRAVENOUS | Status: AC | PRN
  Administered 2015-06-27: 75 mL via INTRAVENOUS

## 2015-07-01 ENCOUNTER — Encounter: Payer: Self-pay | Admitting: Internal Medicine

## 2015-07-01 ENCOUNTER — Ambulatory Visit (HOSPITAL_BASED_OUTPATIENT_CLINIC_OR_DEPARTMENT_OTHER): Payer: Medicare Other | Admitting: Internal Medicine

## 2015-07-01 ENCOUNTER — Telehealth: Payer: Self-pay | Admitting: Internal Medicine

## 2015-07-01 VITALS — BP 153/70 | HR 73 | Temp 97.7°F | Resp 20 | Ht 62.0 in | Wt 137.2 lb

## 2015-07-01 DIAGNOSIS — C3491 Malignant neoplasm of unspecified part of right bronchus or lung: Secondary | ICD-10-CM

## 2015-07-01 DIAGNOSIS — C3411 Malignant neoplasm of upper lobe, right bronchus or lung: Secondary | ICD-10-CM

## 2015-07-01 NOTE — Progress Notes (Signed)
[facility] [phone]   Fax:([phone]) [place], [place] [address]  PRINCIPAL DIAGNOSIS: recurrent small cell lung cancer initially diagnosed as Limited stage small cell lung cancer in June 2011, with recurrence in June 2015.   PRIOR THERAPY:  1. Status post 4 cycles of systemic chemotherapy with carboplatin and etoposide. Last dose was given 11/14/2009. This was concurrent with radiotherapy. 2. Status post prophylactic cranial irradiation completed January 28, 2010. 3. Status post wedge resection of the right upper lobe lung nodule under the care of Dr. Cyndia Bent on 09/21/2013 and the final pathology was consistent with small cell lung cancer. 4. systemic chemotherapy with carboplatin for AUC of 5 on day 1 and etoposide 120 mg/M2 on days 1, 2 and 3 with Neulasta support on day 4 every 3 weeks. Status post 4 cycles. First dose on 10/30/2013.  CURRENT THERAPY: Observation.  INTERVAL HISTORY: Claudia [name] 70 y.o. female returns to the clinic today for followup visit accompanied by her husband. The patient is feeling fine today with no specific complaints and no significant change since his last visit. The patient denied having any significant nausea or vomiting, no fever or chills. She denied having any significant shortness of breath, cough or hemoptysis. She denied having any significant weight loss or night sweats. She had repeat CT scan of the chest performed recently and she is here for evaluation and discussion of her scan results.  MEDICAL HISTORY: Past Medical History  Diagnosis Date  . COPD (chronic obstructive pulmonary disease) ([place])   . Anemia   . Bradycardia     mild,may be due to beta blocker therapy  . Lymphocytic colitis   . GERD (gastroesophageal reflux disease)     otc  . Arthritis   . Pneumonia   . Lung cancer [facility]) 2010    Dr. Julien Nordmann, finished chemo, sp  radiation, left upper   . Local recurrence of lung cancer ([place]) dx'd 07/2013    rt thoracotomy chemo comp 12/2013    ALLERGIES:  is allergic to azithromycin and tussionex pennkinetic er.  MEDICATIONS:  Current Outpatient Prescriptions  Medication Sig Dispense Refill  . albuterol (PROVENTIL HFA;VENTOLIN HFA) 108 (90 BASE) MCG/ACT inhaler Inhale 2 puffs into the lungs every 6 (six) hours as needed for wheezing or shortness of breath. 1 Inhaler 6  . co-enzyme Q-10 30 MG capsule Take 30 mg by mouth daily.    . fluticasone (FLONASE) 50 MCG/ACT nasal spray Place 2 sprays into both nostrils daily. 16 g 4  . Multiple Vitamins-Minerals (WOMENS 50+ MULTI VITAMIN/MIN PO) Take 1 each by mouth daily.    . Probiotic Product (PROBIOTIC DAILY PO) Take 1 each by mouth daily.    . Fluticasone Furoate-Vilanterol (BREO ELLIPTA) 100-25 MCG/INH AEPB Inhale 1 puff into the lungs daily. (Patient not taking: Reported on 04/30/2015) 1 each 5   No current facility-administered medications for this visit.    SURGICAL HISTORY:  Past Surgical History  Procedure Laterality Date  . Neck surgery  1980's  . Dilation and curettage of uterus    . Uterine fibroid surgery  2012  . Bronchoscopy  2011  . Thoracotomy Right 09/21/2013    Procedure: THORACOTOMY MAJOR;  Surgeon: Gaye Pollack, MD;  Location: [facility] OR;  Service: Thoracic;  Laterality: Right;  . Wedge resection Right 09/21/2013    Procedure: RIGHT UPPER LOBE WEDGE RESECTION;  Surgeon: Gaye Pollack,  MD;  Location: [facility] OR;  Service: Thoracic;  Laterality: Right;    REVIEW OF SYSTEMS:  A comprehensive review of systems was negative.   PHYSICAL EXAMINATION: General appearance: alert, cooperative and no distress Head: Normocephalic, without obvious abnormality, atraumatic Neck: no adenopathy Lymph nodes: Cervical, supraclavicular, and axillary nodes normal. Resp: clear to auscultation bilaterally Back: symmetric, no curvature. ROM normal. No CVA  tenderness. Cardio: regular rate and rhythm, S1, S2 normal, no murmur, click, rub or gallop GI: soft, non-tender; bowel sounds normal; no masses,  no organomegaly Extremities: extremities normal, atraumatic, no cyanosis or edema Neurologic: Alert and oriented X 3, normal strength and tone. Normal symmetric reflexes. Normal coordination and gait  ECOG PERFORMANCE STATUS: 0 - Asymptomatic  Blood pressure 153/70, pulse 73, temperature 97.7 F (36.5 C), temperature source Oral, resp. rate 20, height _0  (1.575 m), weight 137 lb 3.2 oz (62.234 kg), SpO2 98 %.  LABORATORY DATA: Lab Results  Component Value Date   WBC 4.9 06/27/2015   HGB 12.2 06/27/2015   HCT 37.2 06/27/2015   MCV 93.1 06/27/2015   PLT 154 06/27/2015      Chemistry      Component Value Date/Time   NA 142 06/27/2015 0831   NA 137 11/06/2013 0906   NA 140 08/05/2011 0823   K 3.8 06/27/2015 0831   K 4.2 11/06/2013 0906   K 4.2 08/05/2011 0823   CL 102 11/06/2013 0906   CL 103 08/03/2012 0842   CL 98 08/05/2011 0823   CO2 29 06/27/2015 0831   CO2 27 11/06/2013 0906   CO2 30 08/05/2011 0823   BUN 12.2 06/27/2015 0831   BUN 15 11/06/2013 0906   BUN 13 08/05/2011 0823   CREATININE 0.9 06/27/2015 0831   CREATININE 0.76 11/06/2013 0906   CREATININE 1.0 08/05/2011 0823      Component Value Date/Time   CALCIUM 10.3 06/27/2015 0831   CALCIUM 10.4 11/06/2013 0906   CALCIUM 9.4 08/05/2011 0823   ALKPHOS 77 06/27/2015 0831   ALKPHOS 112 11/06/2013 0906   ALKPHOS 69 08/05/2011 0823   AST 17 06/27/2015 0831   AST 13 11/06/2013 0906   AST 19 08/05/2011 0823   ALT 10 06/27/2015 0831   ALT 11 11/06/2013 0906   ALT 16 08/05/2011 0823   BILITOT 0.50 06/27/2015 0831   BILITOT 1.3* 11/06/2013 0906   BILITOT 0.70 08/05/2011 0823       RADIOGRAPHIC STUDIES: Ct Chest W Contrast  06/27/2015  CLINICAL DATA:  Followup small cell lung cancer EXAM: CT CHEST WITH CONTRAST TECHNIQUE: Multidetector CT imaging of the chest  was performed during intravenous contrast administration. CONTRAST:  47m ISOVUE-300 IOPAMIDOL (ISOVUE-300) INJECTION 61% COMPARISON:  02/19/2015 FINDINGS: Mediastinum: The heart size is normal. The trachea appears patent and is midline. The esophagus appears normal. No enlarged mediastinal or hilar lymph nodes. Aortic atherosclerosis noted. No axillary or supraclavicular adenopathy Lungs/Pleura: Postoperative changes involving right upper lobe. Radiation change within the paramediastinal left lung is again noted and appears similar to previous exam. Moderate changes of centrilobular emphysema noted. Small nodules in the right upper lobe are again noted. These appear unchanged from previous exam. Index nodule measures 3 mm, image 24 of series 5 and is unchanged. Subpleural nodule within the posterior right lower lobe is stable measuring 3 mm. 5 mm right lower lobe lung nodule is also unchanged, image 39 of series 5. There is a new tiny nodule in the left lower lobe, nonspecific measure 3 mm, image 35  of series 5. This is nonspecific but warrants attention on follow-up imaging. Upper Abdomen: Chronic pneumoperitoneum is again identified. This has been documented over the course of numerous examinations. The adrenal glands are normal. Normal appearance of the spleen and visualized portions of the liver. The adrenal glands are normal. Musculoskeletal: Thoracic spondylosis is identified. No aggressive lytic or sclerotic bone lesion. IMPRESSION: 1. Stable postoperative changes involving the right lung and radiation changes involving the left lung. There no specific findings identified to suggest recurrent tumor. 2. Small nonspecific pulmonary nodules in the right lung are unchanged when compared with previous exam. There is a new tiny nodule in the medial left lower lobe measuring 3 mm. The appearance is nonspecific but warrants attention on follow-up imaging. 3. Chronic pneumoperitoneum within the upper abdomen. This is  been documented over the course of numerous studies and Dr. Earlie Server has been advised of this ongoing abnormality. Electronically Signed   By: Kerby Moors M.D.   On: 06/27/2015 10:26   ASSESSMENT AND PLAN: This is a very pleasant 70 years old white female with limited stage small cell lung cancer diagnosed in June of 2011 status post 4 cycles of systemic chemotherapy with carboplatin and etoposide concurrent with radiation followed by prophylactic cranial irradiation and has been observation since November 2007. She underwent right upper lobe wedge resection of the pulmonary nodule in June 2015 and the final pathology was consistent with recurrent small cell lung cancer. The patient was started on systemic chemotherapy with carboplatin and etoposide status post 4 cycles with no evidence for disease recurrence.  Her recent CT scan of the chest showed no evidence for disease progression. There is a tiny medial left lower lobe nodule measuring 3 mmr that is nonspecific. I will continue to monitor it closely on upcoming scan. I discussed the scan results with the patient and her husband. I recommended for her to continue on observation with repeat CT scan of the chest in 6 months.  The patient had chronic free intraperitoneal air demonstrated within the upper abdomen and this has been going on for several years now. It was investigated in the past by her gynecologist and no clear etiology was found. She is currently asymptomatic. She was advised to call immediately if she has any concerning symptoms in the interval  All questions were answered. The patient knows to call the clinic with any problems, questions or concerns. We can certainly see the patient much sooner if necessary.  Disclaimer: This note was dictated with voice recognition software. Similar sounding words can inadvertently be transcribed and may not be corrected upon review.

## 2015-07-01 NOTE — Telephone Encounter (Signed)
Gave patient avs report and appointments for October. Pof not yet available. Appointments scheduled bases on available orders.

## 2015-09-09 IMAGING — CR DG CHEST 1V PORT
1 series · 1 of 1 positions shown · non-contrast
Comparison: 09/21/2013

CLINICAL DATA: Post vats

EXAM:
PORTABLE CHEST - 1 VIEW

[AP]
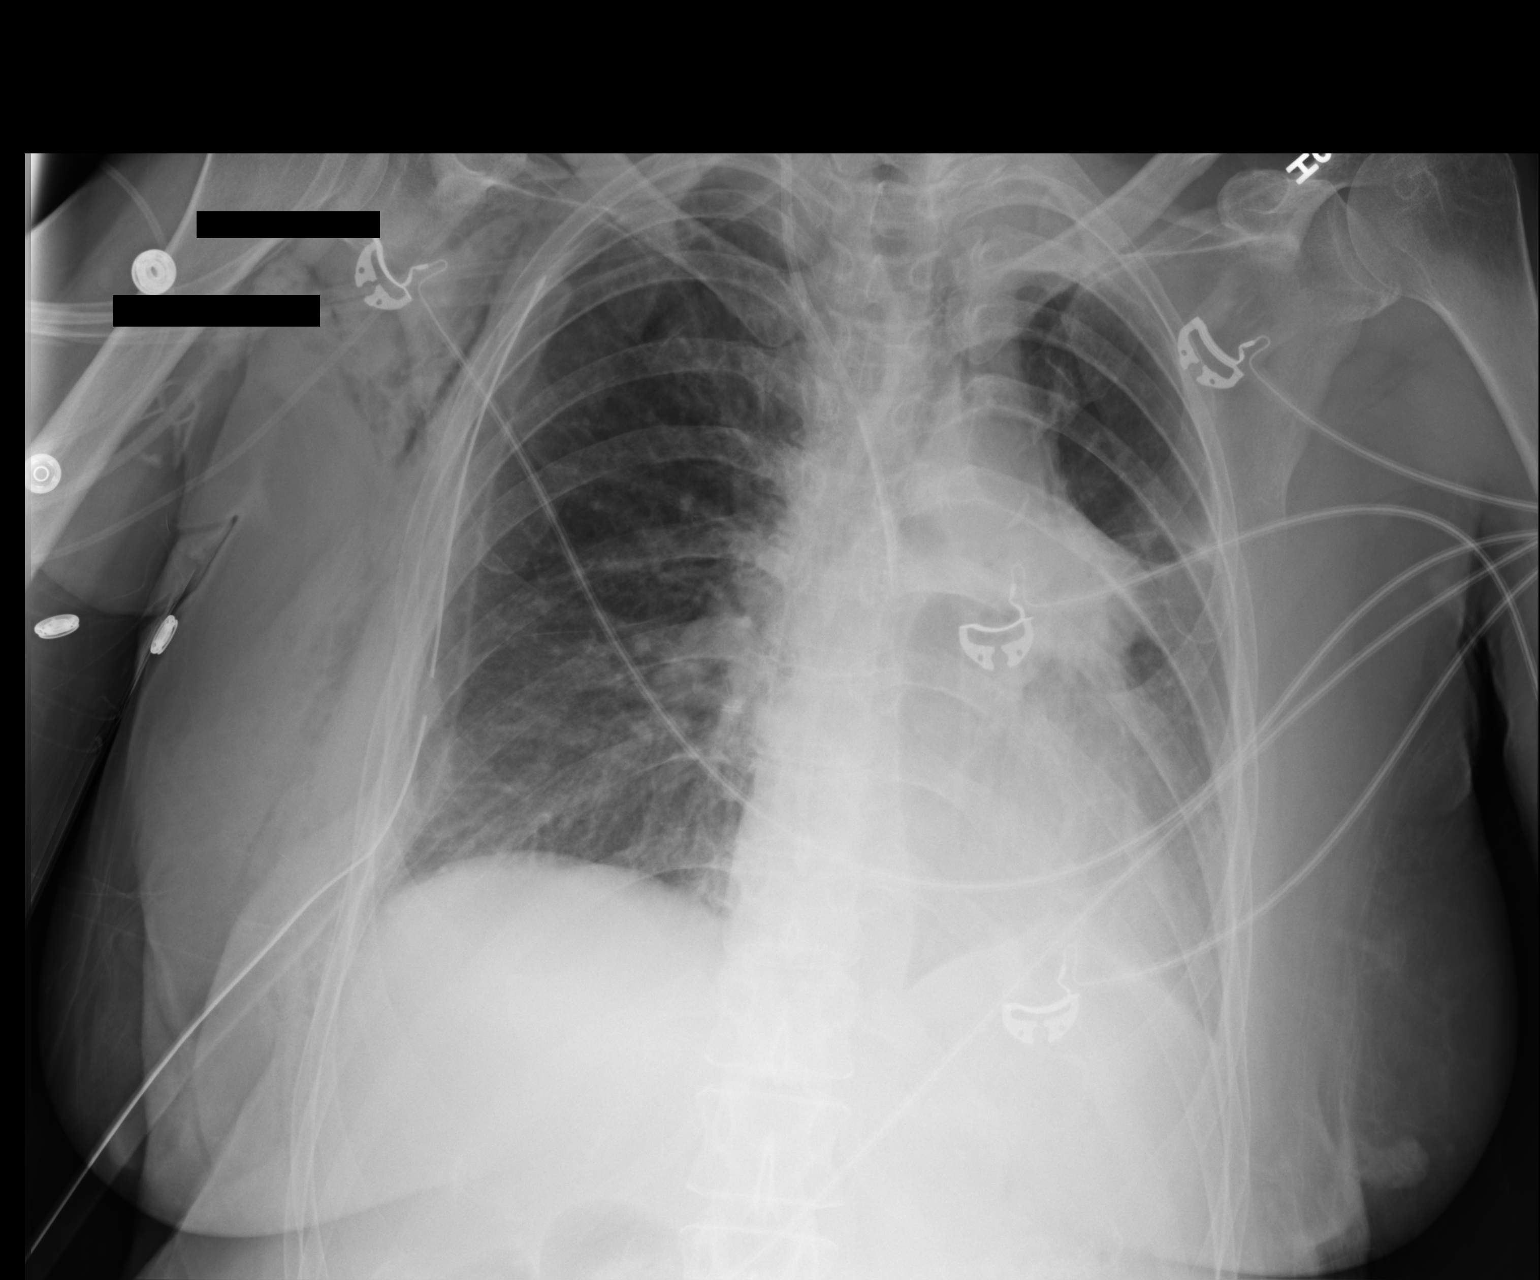

[1 of 1 positions shown; findings below may reference images not displayed]

FINDINGS: Right chest tube remains in place with no pneumothorax. Right
central line tip is in the SVC, unchanged. Heart is normal size.
Subcutaneous air within the right chest wall is stable. Stable
fullness of the left hilum with post radiation changes. This is
unchanged. No visible effusions. No acute bony abnormality.
IMPRESSION: Stable exam.  No pneumothorax.

## 2015-09-10 IMAGING — CR DG CHEST 1V PORT
1 series · 1 of 1 positions shown · non-contrast
Comparison: Chest x-ray 09/22/2013.

CLINICAL DATA: History of lung cancer status post wedge resection.

EXAM:
PORTABLE CHEST - 1 VIEW

[AP]
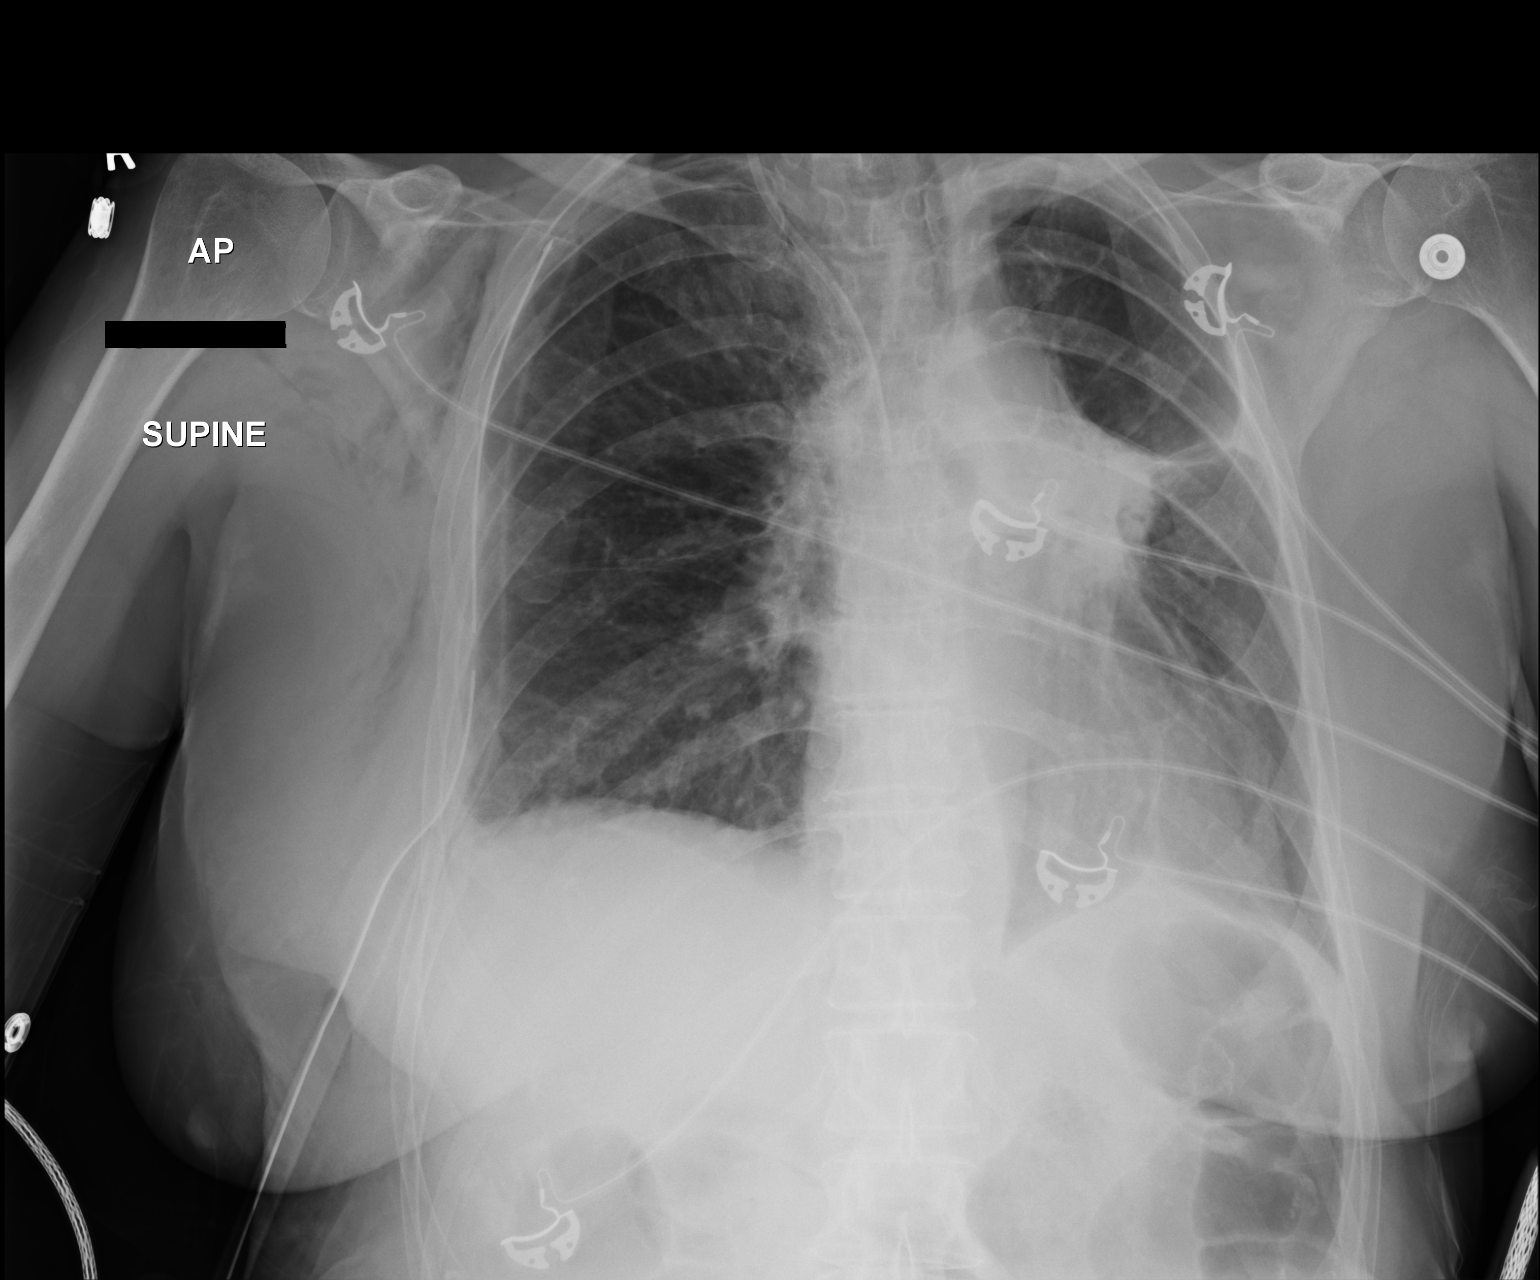

[1 of 1 positions shown; findings below may reference images not displayed]

FINDINGS: Right internal jugular central venous catheter with tip terminating
in the mid superior vena cava. Right-sided chest tube remains in
position with tip in the lateral aspect of the apex of the right
hemithorax. Small right apical pneumothorax occupying approximately
5% of the volume of the right hemithorax is now noted. Masslike
opacity perihilar region and medial aspect of the left upper lobe
related to prior radiation therapy, similar to recent prior studies.
No acute consolidative airspace disease. Mild blunting of the right
costophrenic sulcus may suggest a trace amount of right-sided
pleural fluid is well. No evidence of pulmonary edema. Heart size is
normal. Mediastinal contours remain distorted. Subcutaneous
emphysema in the right chest wall appears slightly decreased.
IMPRESSION: 1. Support apparatus, as above.
2. Interval reaccumulation of a small right pneumothorax in the
right apex (less than 5% of the volume of the right hemithorax).
There may also be a trace amount of right-sided pleural fluid (i.e.,
this may represent a hydropneumothorax).
3. Otherwise, the radiographic appearance the chest is largely
unchanged, as above.
These results will be called to the ordering clinician or
representative by the Radiologist Assistant, and communication
documented in the PACS or zVision Dashboard.

## 2015-12-30 ENCOUNTER — Ambulatory Visit (HOSPITAL_COMMUNITY)
Admission: RE | Admit: 2015-12-30 | Discharge: 2015-12-30 | Disposition: A | Discharge status: Home / Self Care | Payer: Medicare Other | Source: Ambulatory Visit | Attending: Internal Medicine | Admitting: Internal Medicine

## 2015-12-30 ENCOUNTER — Encounter (HOSPITAL_COMMUNITY): Payer: Self-pay

## 2015-12-30 ENCOUNTER — Other Ambulatory Visit (HOSPITAL_BASED_OUTPATIENT_CLINIC_OR_DEPARTMENT_OTHER): Payer: Medicare Other

## 2015-12-30 DIAGNOSIS — C3411 Malignant neoplasm of upper lobe, right bronchus or lung: Secondary | ICD-10-CM

## 2015-12-30 DIAGNOSIS — I700 Atherosclerosis of aorta: Secondary | ICD-10-CM | POA: Insufficient documentation

## 2015-12-30 DIAGNOSIS — R911 Solitary pulmonary nodule: Secondary | ICD-10-CM | POA: Insufficient documentation

## 2015-12-30 DIAGNOSIS — C3491 Malignant neoplasm of unspecified part of right bronchus or lung: Secondary | ICD-10-CM

## 2015-12-30 DIAGNOSIS — C3490 Malignant neoplasm of unspecified part of unspecified bronchus or lung: Secondary | ICD-10-CM | POA: Insufficient documentation

## 2015-12-30 LAB — CBC WITH DIFFERENTIAL/PLATELET
BASO%: 0.7 % (ref 0.0–2.0)
Basophils Absolute: 0 10*3/uL (ref 0.0–0.1)
EOS%: 1.3 % (ref 0.0–7.0)
Eosinophils Absolute: 0.1 10*3/uL (ref 0.0–0.5)
HCT: 39.3 % (ref 34.8–46.6)
HGB: 12.8 g/dL (ref 11.6–15.9)
LYMPH%: 11.3 % — ABNORMAL LOW (ref 14.0–49.7)
MCH: 30.6 pg (ref 25.1–34.0)
MCHC: 32.5 g/dL (ref 31.5–36.0)
MCV: 94.1 fL (ref 79.5–101.0)
MONO#: 0.6 10*3/uL (ref 0.1–0.9)
MONO%: 11.8 % (ref 0.0–14.0)
NEUT#: 3.9 10*3/uL (ref 1.5–6.5)
NEUT%: 74.9 % (ref 38.4–76.8)
Platelets: 165 10*3/uL (ref 145–400)
RBC: 4.18 10*6/uL (ref 3.70–5.45)
RDW: 14.8 % — ABNORMAL HIGH (ref 11.2–14.5)
WBC: 5.2 10*3/uL (ref 3.9–10.3)
lymph#: 0.6 10*3/uL — ABNORMAL LOW (ref 0.9–3.3)

## 2015-12-30 LAB — COMPREHENSIVE METABOLIC PANEL
ALT: 11 U/L (ref 0–55)
AST: 18 U/L (ref 5–34)
Albumin: 3.9 g/dL (ref 3.5–5.0)
Alkaline Phosphatase: 85 U/L (ref 40–150)
Anion Gap: 9 mEq/L (ref 3–11)
BUN: 16.3 mg/dL (ref 7.0–26.0)
CO2: 27 mEq/L (ref 22–29)
Calcium: 10.3 mg/dL (ref 8.4–10.4)
Chloride: 104 mEq/L (ref 98–109)
Creatinine: 0.9 mg/dL (ref 0.6–1.1)
EGFR: 62 mL/min/{1.73_m2} — ABNORMAL LOW (ref >90)
Glucose: 96 mg/dl (ref 70–140)
Potassium: 4.3 mEq/L (ref 3.5–5.1)
Sodium: 141 mEq/L (ref 136–145)
Total Bilirubin: 0.43 mg/dL (ref 0.20–1.20)
Total Protein: 7.4 g/dL (ref 6.4–8.3)

## 2015-12-30 MED ORDER — IOPAMIDOL (ISOVUE-300) INJECTION 61%
75.0000 mL | Freq: Once | INTRAVENOUS | Status: AC | PRN
Start: 2015-12-30 — End: 2015-12-30
  Administered 2015-12-30: 75 mL via INTRAVENOUS

## 2016-01-02 ENCOUNTER — Telehealth: Payer: Self-pay | Admitting: Internal Medicine

## 2016-01-02 ENCOUNTER — Encounter: Payer: Self-pay | Admitting: Internal Medicine

## 2016-01-02 ENCOUNTER — Ambulatory Visit (HOSPITAL_BASED_OUTPATIENT_CLINIC_OR_DEPARTMENT_OTHER): Payer: Medicare Other | Admitting: Internal Medicine

## 2016-01-02 VITALS — BP 164/68 | HR 82 | Resp 17 | Ht 62.0 in | Wt 136.4 lb

## 2016-01-02 DIAGNOSIS — R918 Other nonspecific abnormal finding of lung field: Secondary | ICD-10-CM | POA: Diagnosis not present

## 2016-01-02 DIAGNOSIS — Z85118 Personal history of other malignant neoplasm of bronchus and lung: Secondary | ICD-10-CM | POA: Diagnosis not present

## 2016-01-02 DIAGNOSIS — C3491 Malignant neoplasm of unspecified part of right bronchus or lung: Secondary | ICD-10-CM

## 2016-01-02 NOTE — Telephone Encounter (Signed)
Gv pt appts for Feb 2018. Advised radiology will call to sched ct scan.

## 2016-01-02 NOTE — Progress Notes (Signed)
[place] Telephone:(336) [phone]   Fax:(336) [address]en[address] Saenz, [place] [address]  PRINCIPAL DIAGNOSIS: recurrent small cell lung cancer initially diagnosed as Limited stage small cell lung cancer in June 2011, with recurrence in June 2015.   PRIOR THERAPY:  1. Status post 4 cycles of systemic chemotherapy with carboplatin and etoposide. Last dose was given 11/14/2009. This was concurrent with radiotherapy. 2. Status post prophylactic cranial irradiation completed January 28, 2010. 3. Status post wedge resection of the right upper lobe lung nodule under the care of Dr. Cyndia Bent on 09/21/2013 and the final pathology was consistent with small cell lung cancer. 4. systemic chemotherapy with carboplatin for AUC of 5 on day 1 and etoposide 120 mg/M2 on days 1, 2 and 3 with Neulasta support on day 4 every 3 weeks. Status post 4 cycles. First dose on 10/30/2013.  CURRENT THERAPY: Observation.  INTERVAL HISTORY: Claudia [name] 70 y.o. female returns to the clinic today for followup visit accompanied by her husband. The patient is feeling fine today with no specific complaints. She was under stress recently after her father-in-law passed away. Her blood pressure is elevated today but usually normal at home. The patient denied having any significant nausea or vomiting, no fever or chills. She denied having any significant shortness of breath, cough or hemoptysis. She denied having any significant weight loss or night sweats. She had repeat CT scan of the chest performed recently and she is here for evaluation and discussion of her scan results.  MEDICAL HISTORY: Past Medical History:  Diagnosis Date  . Anemia   . Arthritis   . Bradycardia    mild,may be due to beta blocker therapy  . COPD (chronic obstructive pulmonary disease) ([place])   . GERD (gastroesophageal reflux disease)    otc  . Local recurrence of  lung cancer ([place]) dx'd 07/2013   rt thoracotomy chemo comp 12/2013  . Lung cancer [facility]) 2010   Dr. Julien Nordmann, finished chemo, sp radiation, left upper   . Lymphocytic colitis   . Pneumonia     ALLERGIES:  is allergic to azithromycin and tussionex pennkinetic er [hydrocod polst-cpm polst er].  MEDICATIONS:  Current Outpatient Prescriptions  Medication Sig Dispense Refill  . albuterol (PROVENTIL HFA;VENTOLIN HFA) 108 (90 BASE) MCG/ACT inhaler Inhale 2 puffs into the lungs every 6 (six) hours as needed for wheezing or shortness of breath. 1 Inhaler 6  . co-enzyme Q-10 30 MG capsule Take 30 mg by mouth daily.    . fluticasone (FLONASE) 50 MCG/ACT nasal spray Place 2 sprays into both nostrils daily. 16 g 4  . Fluticasone Furoate-Vilanterol (BREO ELLIPTA) 100-25 MCG/INH AEPB Inhale 1 puff into the lungs daily. (Patient not taking: Reported on 04/30/2015) 1 each 5  . Multiple Vitamins-Minerals (WOMENS 50+ MULTI VITAMIN/MIN PO) Take 1 each by mouth daily.    . Probiotic Product (PROBIOTIC DAILY PO) Take 1 each by mouth daily.     No current facility-administered medications for this visit.     SURGICAL HISTORY:  Past Surgical History:  Procedure Laterality Date  . BRONCHOSCOPY  2011  . DILATION AND CURETTAGE OF UTERUS    . NECK SURGERY  1980's  . THORACOTOMY Right 09/21/2013   Procedure: THORACOTOMY MAJOR;  Surgeon: Gaye Pollack, MD;  Location: [place] [facility];  Service: Thoracic;  Laterality: Right;  . UTERINE FIBROID SURGERY  2012  . WEDGE RESECTION Right 09/21/2013  Procedure: RIGHT UPPER LOBE WEDGE RESECTION;  Surgeon: Gaye Pollack, MD;  Location: [facility];  Service: Thoracic;  Laterality: Right;    REVIEW OF SYSTEMS:  A comprehensive review of systems was negative.   PHYSICAL EXAMINATION: General appearance: alert, cooperative and no distress Head: Normocephalic, without obvious abnormality, atraumatic Neck: no adenopathy Lymph nodes: Cervical, supraclavicular, and axillary nodes  normal. Resp: clear to auscultation bilaterally Back: symmetric, no curvature. ROM normal. No CVA tenderness. Cardio: regular rate and rhythm, S1, S2 normal, no murmur, click, rub or gallop GI: soft, non-tender; bowel sounds normal; no masses,  no organomegaly Extremities: extremities normal, atraumatic, no cyanosis or edema Neurologic: Alert and oriented X 3, normal strength and tone. Normal symmetric reflexes. Normal coordination and gait  ECOG PERFORMANCE STATUS: 0 - Asymptomatic  Blood pressure (!) 164/68, pulse 82, resp. rate 17, height '5\' 2"'$  (1.575 m), weight 136 lb 6.4 oz (61.9 kg), SpO2 99 %.  LABORATORY DATA: Lab Results  Component Value Date   WBC 5.2 12/30/2015   HGB 12.8 12/30/2015   HCT 39.3 12/30/2015   MCV 94.1 12/30/2015   PLT 165 12/30/2015      Chemistry      Component Value Date/Time   NA 141 12/30/2015 1116   K 4.3 12/30/2015 1116   CL 102 11/06/2013 0906   CL 103 08/03/2012 0842   CO2 27 12/30/2015 1116   BUN 16.3 12/30/2015 1116   CREATININE 0.9 12/30/2015 1116      Component Value Date/Time   CALCIUM 10.3 12/30/2015 1116   ALKPHOS 85 12/30/2015 1116   AST 18 12/30/2015 1116   ALT 11 12/30/2015 1116   BILITOT 0.43 12/30/2015 1116       RADIOGRAPHIC STUDIES: Ct Chest W Contrast  Result Date: 12/30/2015 CLINICAL DATA:  Lung cancer. EXAM: CT CHEST WITH CONTRAST TECHNIQUE: Multidetector CT imaging of the chest was performed during intravenous contrast administration. CONTRAST:  29m ISOVUE-300 IOPAMIDOL (ISOVUE-300) INJECTION 61% COMPARISON:  06/27/2015. FINDINGS: Cardiovascular: Atherosclerotic calcification of the arterial vasculature. Heart size normal. No pericardial effusion. Mediastinum/Nodes: No pathologically enlarged mediastinal, right hilar or axillary lymph nodes. Soft tissue thickening in the left hilum, as before. Esophagus is somewhat air-filled throughout its course. Lungs/Pleura: Soft tissue confluence and bronchiectasis in the medial  left hemi thorax, stable and treatment related. 6 mm nodule in the lingula (series 5, image 66), more prominent than on the prior exam. Previously described 3 mm medial left lower lobe nodule is no longer readily identified. Trace loculated left pleural fluid, stable. Overall volume loss in the left hemi thorax. Moderate centrilobular emphysema. Postop scarring in the anteromedial right hemi thorax. Scattered nodularity in the right lung measures up to 4 mm in the right lower lobe, stable. No right pleural fluid. Airway is otherwise unremarkable. Upper Abdomen: Low-attenuation lesions in the liver measure up to 1.6 cm in the left hepatic lobe, as before. Visualized portions of the liver, gallbladder and adrenal glands are otherwise unremarkable. Low-attenuation lesions in the kidneys measure up 12 mm on the right and are likely cysts, statistically. Visualized portions of the spleen, pancreas, stomach and bowel are grossly unremarkable. No upper abdominal adenopathy. Chronic pneumoperitoneum in the left upper quadrant again noted. Musculoskeletal: No worrisome lytic or sclerotic lesions. IMPRESSION: 1. Posttreatment changes in the left hemi thorax. 6 mm nodule in the lingula is more prominent than on the prior exam. Continued attention on followup exams is warranted. 2. Previously described 3 mm medial left lower lobe nodule is no  longer visualized. 3. Aortic atherosclerosis. 4. Chronic left upper quadrant pneumoperitoneum. Electronically Signed   By: Lorin Picket M.D.   On: 12/30/2015 15:00   ASSESSMENT AND PLAN: This is a very pleasant 70 years old white female with limited stage small cell lung cancer diagnosed in June of 2011 status post 4 cycles of systemic chemotherapy with carboplatin and etoposide concurrent with radiation followed by prophylactic cranial irradiation and has been observation since November 2007. She underwent right upper lobe wedge resection of the pulmonary nodule in June 2015 and the  final pathology was consistent with recurrent small cell lung cancer. The patient was started on systemic chemotherapy with carboplatin and etoposide status post 4 cycles with no evidence for disease recurrence.  Her recent CT scan of the chest showed no evidence for disease progression except for prominent 6 mm nodule in the lingula. I discussed the scan results with the patient and her husband. I recommended for her to have repeat CT scan of the chest in 4 months for reevaluation of these nodules. She agreed to the current plan. The patient had chronic free intraperitoneal air demonstrated within the upper abdomen and this has been going on for several years now. It was investigated in the past by her gynecologist and no clear etiology was found. She is currently asymptomatic. She was advised to call immediately if she has any concerning symptoms in the interval  All questions were answered. The patient knows to call the clinic with any problems, questions or concerns. We can certainly see the patient much sooner if necessary.  Disclaimer: This note was dictated with voice recognition software. Similar sounding words can inadvertently be transcribed and may not be corrected upon review.

## 2016-04-30 ENCOUNTER — Ambulatory Visit (HOSPITAL_COMMUNITY)
Admission: RE | Admit: 2016-04-30 | Discharge: 2016-04-30 | Disposition: A | Discharge status: Home / Self Care | Payer: Medicare Other | Source: Ambulatory Visit | Attending: Internal Medicine | Admitting: Internal Medicine

## 2016-04-30 ENCOUNTER — Other Ambulatory Visit (HOSPITAL_BASED_OUTPATIENT_CLINIC_OR_DEPARTMENT_OTHER): Payer: Medicare Other

## 2016-04-30 ENCOUNTER — Encounter (HOSPITAL_COMMUNITY): Payer: Self-pay

## 2016-04-30 DIAGNOSIS — C3491 Malignant neoplasm of unspecified part of right bronchus or lung: Secondary | ICD-10-CM | POA: Diagnosis not present

## 2016-04-30 DIAGNOSIS — Z923 Personal history of irradiation: Secondary | ICD-10-CM | POA: Insufficient documentation

## 2016-04-30 DIAGNOSIS — Z85118 Personal history of other malignant neoplasm of bronchus and lung: Secondary | ICD-10-CM

## 2016-04-30 DIAGNOSIS — D1803 Hemangioma of intra-abdominal structures: Secondary | ICD-10-CM | POA: Diagnosis not present

## 2016-04-30 DIAGNOSIS — J984 Other disorders of lung: Secondary | ICD-10-CM | POA: Insufficient documentation

## 2016-04-30 DIAGNOSIS — K6389 Other specified diseases of intestine: Secondary | ICD-10-CM | POA: Insufficient documentation

## 2016-04-30 DIAGNOSIS — J439 Emphysema, unspecified: Secondary | ICD-10-CM | POA: Diagnosis not present

## 2016-04-30 LAB — CBC WITH DIFFERENTIAL/PLATELET
BASO%: 0.5 % (ref 0.0–2.0)
Basophils Absolute: 0 10*3/uL (ref 0.0–0.1)
EOS%: 1.9 % (ref 0.0–7.0)
Eosinophils Absolute: 0.1 10*3/uL (ref 0.0–0.5)
HCT: 36.9 % (ref 34.8–46.6)
HGB: 13.1 g/dL (ref 11.6–15.9)
LYMPH%: 11.5 % — ABNORMAL LOW (ref 14.0–49.7)
MCH: 31 pg (ref 25.1–34.0)
MCHC: 35.5 g/dL (ref 31.5–36.0)
MCV: 87.2 fL (ref 79.5–101.0)
MONO#: 0.9 10*3/uL (ref 0.1–0.9)
MONO%: 14.6 % — ABNORMAL HIGH (ref 0.0–14.0)
NEUT#: 4.2 10*3/uL (ref 1.5–6.5)
NEUT%: 71.5 % (ref 38.4–76.8)
Platelets: 147 10*3/uL (ref 145–400)
RBC: 4.23 10*6/uL (ref 3.70–5.45)
RDW: 14.4 % (ref 11.2–14.5)
WBC: 5.9 10*3/uL (ref 3.9–10.3)
lymph#: 0.7 10*3/uL — ABNORMAL LOW (ref 0.9–3.3)

## 2016-04-30 LAB — COMPREHENSIVE METABOLIC PANEL
ALT: 14 U/L (ref 0–55)
AST: 20 U/L (ref 5–34)
Albumin: 4.1 g/dL (ref 3.5–5.0)
Alkaline Phosphatase: 91 U/L (ref 40–150)
Anion Gap: 8 mEq/L (ref 3–11)
BUN: 16.6 mg/dL (ref 7.0–26.0)
CO2: 30 mEq/L — ABNORMAL HIGH (ref 22–29)
Calcium: 11.2 mg/dL — ABNORMAL HIGH (ref 8.4–10.4)
Chloride: 103 mEq/L (ref 98–109)
Creatinine: 1 mg/dL (ref 0.6–1.1)
EGFR: 61 mL/min/{1.73_m2} — ABNORMAL LOW (ref >90)
Glucose: 90 mg/dl (ref 70–140)
Potassium: 4.2 mEq/L (ref 3.5–5.1)
Sodium: 140 mEq/L (ref 136–145)
Total Bilirubin: 0.61 mg/dL (ref 0.20–1.20)
Total Protein: 7.5 g/dL (ref 6.4–8.3)

## 2016-04-30 MED ORDER — IOPAMIDOL (ISOVUE-300) INJECTION 61%
75.0000 mL | Freq: Once | INTRAVENOUS | Status: AC | PRN
  Administered 2016-04-30: 75 mL via INTRAVENOUS

## 2016-04-30 MED ORDER — IOPAMIDOL (ISOVUE-300) INJECTION 61%
INTRAVENOUS | Status: AC
  Filled 2016-04-30: qty 75

## 2016-05-05 ENCOUNTER — Encounter: Payer: Self-pay | Admitting: Internal Medicine

## 2016-05-05 ENCOUNTER — Telehealth: Payer: Self-pay | Admitting: Internal Medicine

## 2016-05-05 ENCOUNTER — Ambulatory Visit (HOSPITAL_BASED_OUTPATIENT_CLINIC_OR_DEPARTMENT_OTHER): Payer: Medicare Other | Admitting: Internal Medicine

## 2016-05-05 VITALS — BP 138/73 | HR 87 | Temp 97.8°F | Resp 18 | Ht 62.0 in | Wt 141.8 lb

## 2016-05-05 DIAGNOSIS — Z85118 Personal history of other malignant neoplasm of bronchus and lung: Secondary | ICD-10-CM

## 2016-05-05 DIAGNOSIS — J441 Chronic obstructive pulmonary disease with (acute) exacerbation: Secondary | ICD-10-CM

## 2016-05-05 DIAGNOSIS — C3491 Malignant neoplasm of unspecified part of right bronchus or lung: Secondary | ICD-10-CM

## 2016-05-05 NOTE — Telephone Encounter (Signed)
Appointments scheduled per 2/6 LOS. Patient given AVS report and calendars with future scheduled appointments. °

## 2016-05-05 NOTE — Progress Notes (Signed)
[place] Telephone:([phone]) [phone]   Fax:(336) [place], [place] [address]  PRINCIPAL DIAGNOSIS: recurrent small cell lung cancer initially diagnosed as Limited stage small cell lung cancer in June 2011, with recurrence in June 2015.   PRIOR THERAPY:  1. Status post 4 cycles of systemic chemotherapy with carboplatin and etoposide. Last dose was given 11/14/2009. This was concurrent with radiotherapy. 2. Status post prophylactic cranial irradiation completed January 28, 2010. 3. Status post wedge resection of the right upper lobe lung nodule under the care of Dr. Cyndia Bent on 09/21/2013 and the final pathology was consistent with small cell lung cancer. 4. systemic chemotherapy with carboplatin for AUC of 5 on day 1 and etoposide 120 mg/M2 on days 1, 2 and 3 with Neulasta support on day 4 every 3 weeks. Status post 4 cycles. First dose on 10/30/2013.  CURRENT THERAPY: Observation.  INTERVAL HISTORY: Claudia [name] 71 y.o. female came to the clinic today for follow-up visit. The patient is feeling fine today with no specific complaints. She denied having any significant weight loss or night sweats. She has no fever or chills. She denied having any significant chest pain, shortness of breath, cough or hemoptysis. She has no nausea, vomiting, diarrhea or constipation. She is here today for evaluation with repeat CT scan of the chest.   MEDICAL HISTORY: Past Medical History:  Diagnosis Date  . Anemia   . Arthritis   . Bradycardia    mild,may be due to beta blocker therapy  . COPD (chronic obstructive pulmonary disease) ([place])   . GERD (gastroesophageal reflux disease)    otc  . Local recurrence of lung cancer ([place]) dx'd 07/2013   rt thoracotomy chemo comp 12/2013  . Lung cancer [facility]) 2010   Dr. Julien Nordmann, finished chemo, sp radiation, left upper   . Lymphocytic colitis   . Pneumonia      ALLERGIES:  is allergic to azithromycin and tussionex pennkinetic er [hydrocod polst-cpm polst er].  MEDICATIONS:  Current Outpatient Prescriptions  Medication Sig Dispense Refill  . albuterol (PROVENTIL HFA;VENTOLIN HFA) 108 (90 BASE) MCG/ACT inhaler Inhale 2 puffs into the lungs every 6 (six) hours as needed for wheezing or shortness of breath. 1 Inhaler 6  . co-enzyme Q-10 30 MG capsule Take 30 mg by mouth daily.    . fluticasone (FLONASE) 50 MCG/ACT nasal spray Place 2 sprays into both nostrils daily. 16 g 4  . Fluticasone Furoate-Vilanterol (BREO ELLIPTA) 100-25 MCG/INH AEPB Inhale 1 puff into the lungs daily. 1 each 5  . Multiple Vitamins-Minerals (WOMENS 50+ MULTI VITAMIN/MIN PO) Take 1 each by mouth daily.    . Probiotic Product (PROBIOTIC DAILY PO) Take 1 each by mouth daily.     No current facility-administered medications for this visit.     SURGICAL HISTORY:  Past Surgical History:  Procedure Laterality Date  . BRONCHOSCOPY  2011  . DILATION AND CURETTAGE OF UTERUS    . NECK SURGERY  1980's  . THORACOTOMY Right 09/21/2013   Procedure: THORACOTOMY MAJOR;  Surgeon: Gaye Pollack, MD;  Location: [place];  Service: Thoracic;  Laterality: Right;  . UTERINE FIBROID SURGERY  2012  . WEDGE RESECTION Right 09/21/2013   Procedure: RIGHT UPPER LOBE WEDGE RESECTION;  Surgeon: Gaye Pollack, MD;  Location: [facility];  Service: Thoracic;  Laterality: Right;    REVIEW OF SYSTEMS:  A comprehensive review of systems was  negative.   PHYSICAL EXAMINATION: General appearance: alert, cooperative and no distress Head: Normocephalic, without obvious abnormality, atraumatic Neck: no adenopathy Lymph nodes: Cervical, supraclavicular, and axillary nodes normal. Resp: clear to auscultation bilaterally Back: symmetric, no curvature. ROM normal. No CVA tenderness. Cardio: regular rate and rhythm, S1, S2 normal, no murmur, click, rub or gallop GI: soft, non-tender; bowel sounds normal; no masses,   no organomegaly Extremities: extremities normal, atraumatic, no cyanosis or edema  ECOG PERFORMANCE STATUS: 0 - Asymptomatic  Blood pressure 138/73, pulse 87, temperature 97.8 F (36.6 C), temperature source Oral, resp. rate 18, height '5\' 2"'$  (1.575 m), weight 141 lb 12.8 oz (64.3 kg), SpO2 100 %.  LABORATORY DATA: Lab Results  Component Value Date   WBC 5.9 04/30/2016   HGB 13.1 04/30/2016   HCT 36.9 04/30/2016   MCV 87.2 04/30/2016   PLT 147 04/30/2016      Chemistry      Component Value Date/Time   NA 140 04/30/2016 0927   K 4.2 04/30/2016 0927   CL 102 11/06/2013 0906   CL 103 08/03/2012 0842   CO2 30 (H) 04/30/2016 0927   BUN 16.6 04/30/2016 0927   CREATININE 1.0 04/30/2016 0927      Component Value Date/Time   CALCIUM 11.2 (H) 04/30/2016 0927   ALKPHOS 91 04/30/2016 0927   AST 20 04/30/2016 0927   ALT 14 04/30/2016 0927   BILITOT 0.61 04/30/2016 0927       RADIOGRAPHIC STUDIES: Ct Chest W Contrast  Result Date: 04/30/2016 CLINICAL DATA:  Restaging lung cancer.  Initial diagnosis May 2015. EXAM: CT CHEST WITH CONTRAST TECHNIQUE: Multidetector CT imaging of the chest was performed during intravenous contrast administration. CONTRAST:  29m ISOVUE-300 IOPAMIDOL (ISOVUE-300) INJECTION 61% COMPARISON:  CT scan 12/30/2015. FINDINGS: Chest wall: No chest wall mass, breast mass, supraclavicular or axillary lymphadenopathy. Small scattered lymph nodes are stable. The thyroid gland appears normal. Cardiovascular: The heart is normal in size. No pericardial effusion. Stable mild tortuosity and calcification of the thoracic aorta. Mediastinum/Nodes: No mediastinal or hilar mass or lymphadenopathy. The esophagus is grossly normal. Lungs/Pleura: Overall stable radiation changes involving the left hemithorax with dense scarring changes in the left paramediastinal long. No findings for recurrent tumor. Stable advanced emphysematous changes. There was a 6 mm nodule noted in the lingula  on the prior study. This is no longer identified. Stable small right upper lobe pulmonary nodules, likely calcified granulomas. No worrisome pulmonary lesions or acute pulmonary findings. Stable surgical changes in the right upper lobe. 5 mm right lower lobe pulmonary nodule on image number 93 previously measured 4 mm. Stable small subpleural density in the right lower lobe on image number 9 measuring 3.5 mm. No pleural effusion. Upper Abdomen: Stable segment 2 liver lesion, likely benign hemangioma. No adrenal gland lesions.  Stable right renal cyst. Stable colonic pneumatosis and pneumoperitoneum. Musculoskeletal: No sig bony findings. IMPRESSION: 1. Stable extensive radiation changes involving the left paramediastinal lung. 2. Resolution of 6 mm lingular nodule.) No nodules are stable. 3. Stable emphysematous changes and pulmonary scarring. 4. No mediastinal or hilar mass or adenopathy. 5. Stable left hepatic lobe hemangioma. 6. Stable colonic pneumatosis and pneumoperitoneum. Electronically Signed   By: PMarijo SanesM.D.   On: 04/30/2016 12:28   ASSESSMENT AND PLAN:  This is a very pleasant 71years old white female with recurrent small cell lung cancer that was initially diagnosed in June 2011. She underwent systemic chemotherapy was carboplatin and etoposide concurrent with radiation and  followed by prophylactic cranial irradiation. The patient was on observation since November 2011 that she had recurrence with right upper lobe nodule in June 2015 status post wedge resection that was consistent with recurrent small cell lung cancer. She was then treated with systemic chemotherapy again with carboplatin and etoposide for 4 cycles. Her recent CT scan of the chest showed no evidence for disease recurrence. I discussed the scan results with the patient and her husband. I recommended for her to continue on observation with repeat CT scan of the chest in 6 months. For the chronic probably intraperitoneal  air, this has been evaluated by her gynecologist and no clear etiology. She was advised to call immediately if she has any concerning symptoms in the interval.  All questions were answered. The patient knows to call the clinic with any problems, questions or concerns. We can certainly see the patient much sooner if necessary. I spent 10 minutes counseling the patient face to face. The total time spent in the appointment was 15 minutes.  Disclaimer: This note was dictated with voice recognition software. Similar sounding words can inadvertently be transcribed and may not be corrected upon review.

## 2016-11-02 ENCOUNTER — Ambulatory Visit (HOSPITAL_COMMUNITY)
Admission: RE | Admit: 2016-11-02 | Discharge: 2016-11-02 | Disposition: A | Discharge status: Home / Self Care | Payer: Medicare Other | Source: Ambulatory Visit | Attending: Internal Medicine | Admitting: Internal Medicine

## 2016-11-02 ENCOUNTER — Encounter (HOSPITAL_COMMUNITY): Payer: Self-pay

## 2016-11-02 ENCOUNTER — Other Ambulatory Visit (HOSPITAL_BASED_OUTPATIENT_CLINIC_OR_DEPARTMENT_OTHER): Payer: Medicare Other

## 2016-11-02 DIAGNOSIS — J441 Chronic obstructive pulmonary disease with (acute) exacerbation: Secondary | ICD-10-CM | POA: Diagnosis not present

## 2016-11-02 DIAGNOSIS — I700 Atherosclerosis of aorta: Secondary | ICD-10-CM | POA: Diagnosis not present

## 2016-11-02 DIAGNOSIS — I2510 Atherosclerotic heart disease of native coronary artery without angina pectoris: Secondary | ICD-10-CM | POA: Diagnosis not present

## 2016-11-02 DIAGNOSIS — C3491 Malignant neoplasm of unspecified part of right bronchus or lung: Secondary | ICD-10-CM

## 2016-11-02 DIAGNOSIS — Z85118 Personal history of other malignant neoplasm of bronchus and lung: Secondary | ICD-10-CM

## 2016-11-02 LAB — COMPREHENSIVE METABOLIC PANEL
ALT: 12 U/L (ref 0–55)
AST: 19 U/L (ref 5–34)
Albumin: 3.8 g/dL (ref 3.5–5.0)
Alkaline Phosphatase: 78 U/L (ref 40–150)
Anion Gap: 6 mEq/L (ref 3–11)
BUN: 13.9 mg/dL (ref 7.0–26.0)
CO2: 29 mEq/L (ref 22–29)
Calcium: 10.9 mg/dL — ABNORMAL HIGH (ref 8.4–10.4)
Chloride: 106 mEq/L (ref 98–109)
Creatinine: 1 mg/dL (ref 0.6–1.1)
EGFR: 59 mL/min/{1.73_m2} — ABNORMAL LOW (ref >90)
Glucose: 89 mg/dl (ref 70–140)
Potassium: 4.2 mEq/L (ref 3.5–5.1)
Sodium: 141 mEq/L (ref 136–145)
Total Bilirubin: 0.47 mg/dL (ref 0.20–1.20)
Total Protein: 7.1 g/dL (ref 6.4–8.3)

## 2016-11-02 LAB — CBC WITH DIFFERENTIAL/PLATELET
BASO%: 0.6 % (ref 0.0–2.0)
Basophils Absolute: 0 10*3/uL (ref 0.0–0.1)
EOS%: 1.3 % (ref 0.0–7.0)
Eosinophils Absolute: 0.1 10*3/uL (ref 0.0–0.5)
HCT: 36.6 % (ref 34.8–46.6)
HGB: 11.9 g/dL (ref 11.6–15.9)
LYMPH%: 12.7 % — ABNORMAL LOW (ref 14.0–49.7)
MCH: 30.5 pg (ref 25.1–34.0)
MCHC: 32.5 g/dL (ref 31.5–36.0)
MCV: 93.8 fL (ref 79.5–101.0)
MONO#: 0.8 10*3/uL (ref 0.1–0.9)
MONO%: 16.9 % — ABNORMAL HIGH (ref 0.0–14.0)
NEUT#: 3.2 10*3/uL (ref 1.5–6.5)
NEUT%: 68.5 % (ref 38.4–76.8)
Platelets: 144 10*3/uL — ABNORMAL LOW (ref 145–400)
RBC: 3.9 10*6/uL (ref 3.70–5.45)
RDW: 14.4 % (ref 11.2–14.5)
WBC: 4.7 10*3/uL (ref 3.9–10.3)
lymph#: 0.6 10*3/uL — ABNORMAL LOW (ref 0.9–3.3)

## 2016-11-02 MED ORDER — IOPAMIDOL (ISOVUE-300) INJECTION 61%
75.0000 mL | Freq: Once | INTRAVENOUS | Status: AC | PRN
  Administered 2016-11-02: 75 mL via INTRAVENOUS

## 2016-11-02 MED ORDER — IOPAMIDOL (ISOVUE-300) INJECTION 61%
INTRAVENOUS | Status: AC
  Filled 2016-11-02: qty 75

## 2016-11-04 ENCOUNTER — Telehealth: Payer: Self-pay | Admitting: Internal Medicine

## 2016-11-04 ENCOUNTER — Ambulatory Visit (HOSPITAL_BASED_OUTPATIENT_CLINIC_OR_DEPARTMENT_OTHER): Payer: Medicare Other | Admitting: Internal Medicine

## 2016-11-04 ENCOUNTER — Encounter: Payer: Self-pay | Admitting: Internal Medicine

## 2016-11-04 VITALS — BP 162/76 | HR 70 | Temp 98.5°F | Resp 18 | Ht 62.0 in | Wt 136.8 lb

## 2016-11-04 DIAGNOSIS — Z85118 Personal history of other malignant neoplasm of bronchus and lung: Secondary | ICD-10-CM

## 2016-11-04 DIAGNOSIS — J449 Chronic obstructive pulmonary disease, unspecified: Secondary | ICD-10-CM

## 2016-11-04 DIAGNOSIS — C3490 Malignant neoplasm of unspecified part of unspecified bronchus or lung: Secondary | ICD-10-CM

## 2016-11-04 NOTE — Telephone Encounter (Signed)
Scheduled appt per 8/8 los - Gave patient AVS and calender per [facility] to contact patient with ct .

## 2016-11-04 NOTE — Progress Notes (Signed)
[facility] Telephone:([phone]   Fax:(336) [place], [place] [address] [place] [address]  PRINCIPAL DIAGNOSIS: recurrent small cell lung cancer initially diagnosed as Limited stage small cell lung cancer in June 2011, with recurrence in June 2015.   PRIOR THERAPY:  1. Status post 4 cycles of systemic chemotherapy with carboplatin and etoposide. Last dose was given 11/14/2009. This was concurrent with radiotherapy. 2. Status post prophylactic cranial irradiation completed January 28, 2010. 3. Status post wedge resection of the right upper lobe lung nodule under the care of Dr. Cyndia Bent on 09/21/2013 and the final pathology was consistent with small cell lung cancer. 4. systemic chemotherapy with carboplatin for AUC of 5 on day 1 and etoposide 120 mg/M2 on days 1, 2 and 3 with Neulasta support on day 4 every 3 weeks. Status post 4 cycles. First dose on 10/30/2013.  CURRENT THERAPY: Observation.  INTERVAL HISTORY: Claudia [name] 71 y.o. female returns to the clinic today for six-month follow-up visit accompanied by her husband. The patient is feeling fine today with no specific complaints. She was a little bit anxious about her scan results her blood pressure was elevated today but usually normal at home. She denied having any chest pain, shortness of breath, cough or hemoptysis. She denied having any fever or chills. The patient denied having any weight loss or night sweats. She has no nausea, vomiting, diarrhea, constipation or abdominal pain. She had repeat CT scan of the chest performed recently and she is here for evaluation and discussion of her scan results.   MEDICAL HISTORY: Past Medical History:  Diagnosis Date  . Anemia   . Arthritis   . Bradycardia    mild,may be due to beta blocker therapy  . COPD (chronic obstructive pulmonary disease) ([place])   . GERD (gastroesophageal reflux disease)    otc   . Local recurrence of lung cancer ([place]) dx'd 07/2013   rt thoracotomy chemo comp 12/2013  . Lung cancer [facility]) 2010   Dr. Julien Nordmann, finished chemo, sp radiation, left upper   . Lymphocytic colitis   . Pneumonia     ALLERGIES:  is allergic to azithromycin and tussionex pennkinetic er [hydrocod polst-cpm polst er].  MEDICATIONS:  Current Outpatient Prescriptions  Medication Sig Dispense Refill  . co-enzyme Q-10 30 MG capsule Take 30 mg by mouth daily.    . fluticasone (FLONASE) 50 MCG/ACT nasal spray Place 2 sprays into both nostrils daily. 16 g 4  . Fluticasone Furoate-Vilanterol (BREO ELLIPTA) 100-25 MCG/INH AEPB Inhale 1 puff into the lungs daily. 1 each 5  . Multiple Vitamins-Minerals (WOMENS 50+ MULTI VITAMIN/MIN PO) Take 1 each by mouth daily.    . Probiotic Product (PROBIOTIC DAILY PO) Take 1 each by mouth daily.    Marland Kitchen albuterol (PROVENTIL HFA;VENTOLIN HFA) 108 (90 BASE) MCG/ACT inhaler Inhale 2 puffs into the lungs every 6 (six) hours as needed for wheezing or shortness of breath. (Patient not taking: Reported on 11/04/2016) 1 Inhaler 6   No current facility-administered medications for this visit.     SURGICAL HISTORY:  Past Surgical History:  Procedure Laterality Date  . BRONCHOSCOPY  2011  . DILATION AND CURETTAGE OF UTERUS    . NECK SURGERY  1980's  . THORACOTOMY Right 09/21/2013   Procedure: THORACOTOMY MAJOR;  Surgeon: Gaye Pollack, MD;  Location: [facility] OR;  Service: Thoracic;  Laterality: Right;  . UTERINE FIBROID SURGERY  2012  . WEDGE RESECTION Right 09/21/2013   Procedure: RIGHT UPPER LOBE WEDGE RESECTION;  Surgeon: Gaye Pollack, MD;  Location: [facility] OR;  Service: Thoracic;  Laterality: Right;    REVIEW OF SYSTEMS:  A comprehensive review of systems was negative.   PHYSICAL EXAMINATION: General appearance: alert, cooperative and no distress Head: Normocephalic, without obvious abnormality, atraumatic Neck: no adenopathy Lymph nodes: Cervical, supraclavicular, and  axillary nodes normal. Resp: clear to auscultation bilaterally Back: symmetric, no curvature. ROM normal. No CVA tenderness. Cardio: regular rate and rhythm, S1, S2 normal, no murmur, click, rub or gallop GI: soft, non-tender; bowel sounds normal; no masses,  no organomegaly Extremities: extremities normal, atraumatic, no cyanosis or edema  ECOG PERFORMANCE STATUS: 0 - Asymptomatic  Blood pressure (!) 162/76, pulse 70, temperature 98.5 F (36.9 C), temperature source Oral, resp. rate 18, height 5\' 2"  (1.575 m), weight 136 lb 12.8 oz (62.1 kg), SpO2 97 %.  LABORATORY DATA: Lab Results  Component Value Date   WBC 4.7 11/02/2016   HGB 11.9 11/02/2016   HCT 36.6 11/02/2016   MCV 93.8 11/02/2016   PLT 144 (L) 11/02/2016      Chemistry      Component Value Date/Time   NA 141 11/02/2016 0919   K 4.2 11/02/2016 0919   CL 102 11/06/2013 0906   CL 103 08/03/2012 0842   CO2 29 11/02/2016 0919   BUN 13.9 11/02/2016 0919   CREATININE 1.0 11/02/2016 0919      Component Value Date/Time   CALCIUM 10.9 (H) 11/02/2016 0919   ALKPHOS 78 11/02/2016 0919   AST 19 11/02/2016 0919   ALT 12 11/02/2016 0919   BILITOT 0.47 11/02/2016 0919       RADIOGRAPHIC STUDIES: Ct Chest W Contrast  Result Date: 11/02/2016 CLINICAL DATA:  Small cell lung cancer, radiation therapy and chemotherapy complete. EXAM: CT CHEST WITH CONTRAST TECHNIQUE: Multidetector CT imaging of the chest was performed during intravenous contrast administration. CONTRAST:  37mL ISOVUE-300 IOPAMIDOL (ISOVUE-300) INJECTION 61% COMPARISON:  04/30/2016. FINDINGS: Cardiovascular: Atherosclerotic calcification of the arterial vasculature, including coronary arteries. Heart size normal. No pericardial effusion. Mediastinum/Nodes: No pathologically enlarged mediastinal, hilar or axillary lymph nodes. Proximal esophagus is mildly dilated. Lungs/Pleura: Centrilobular emphysema. Scattered right lung nodules measure 5 mm or less in size,  stable. Post radiation soft tissue confluence, bronchiectasis, volume loss and architectural distortion in the medial left hemithorax, stable. Peribronchovascular nodularity/mucoid impaction in the left upper lobe, likely infectious or post infectious in etiology. No pleural fluid. Airway is otherwise unremarkable. Upper Abdomen: Visualized portions of the liver, gallbladder and adrenal glands are unremarkable. Low-attenuation lesions in the kidneys measure up to 1.4 cm off the upper pole right kidney and are likely cysts. Visualized portions of the spleen, pancreas and stomach are grossly unremarkable. Chronic pneumatosis and pneumoperitoneum, as before. No upper abdominal adenopathy. Musculoskeletal: Degenerative changes in the spine. No worrisome lytic or sclerotic lesions. IMPRESSION: 1. Post radiation changes in the left hemithorax. 2. Aortic atherosclerosis (ICD10-170.0). Coronary artery calcification. 3.  Emphysema (ICD10-J43.9). 4. Chronic pneumatosis and pneumoperitoneum. Electronically Signed   By: Lorin Picket M.D.   On: 11/02/2016 12:58   ASSESSMENT AND PLAN:  This is a very pleasant 71 years old white female with recurrent small cell lung cancer that was initially diagnosed in June 2011. She underwent systemic chemotherapy was carboplatin and etoposide concurrent with radiation and followed by prophylactic cranial irradiation. The patient was on observation since November 2011 that she had recurrence with right  upper lobe nodule in June 2015 status post wedge resection that was consistent with recurrent small cell lung cancer. She was then treated with systemic chemotherapy again with carboplatin and etoposide for 4 cycles. The patient is currently on observation and she is feeling fine. She had repeat CT scan of the chest performed recently that showed no evidence for disease recurrence. I discussed the scan results with the patient and her husband and recommended for her to continue on  observation with repeat CT scan of the chest in 6 months. She was advised to call immediately if she has any concerning symptoms in the interval. For the chronic probably intraperitoneal air, this has been going on for years and previously evaluated by her gynecologist and no clear etiology. All questions were answered. The patient knows to call the clinic with any problems, questions or concerns. We can certainly see the patient much sooner if necessary. I spent 10 minutes counseling the patient face to face. The total time spent in the appointment was 15 minutes.  Disclaimer: This note was dictated with voice recognition software. Similar sounding words can inadvertently be transcribed and may not be corrected upon review.

## 2017-05-07 ENCOUNTER — Other Ambulatory Visit: Payer: Self-pay | Admitting: Medical Oncology

## 2017-05-07 ENCOUNTER — Other Ambulatory Visit: Payer: Medicare Other

## 2017-05-07 ENCOUNTER — Ambulatory Visit (HOSPITAL_COMMUNITY): Payer: Medicare Other

## 2017-05-10 ENCOUNTER — Ambulatory Visit: Payer: Medicare Other | Admitting: Internal Medicine

## 2017-05-10 ENCOUNTER — Telehealth: Payer: Self-pay

## 2017-05-10 NOTE — Telephone Encounter (Signed)
Patient appointment was rescheduled to 2/19. She came by to get a appointment for labs per [facility] request for 2/18, going by ct to schedule appointment for 2/18 also. Per 2/10 walk in.

## 2017-05-17 ENCOUNTER — Ambulatory Visit (HOSPITAL_COMMUNITY)
Admission: RE | Admit: 2017-05-17 | Discharge: 2017-05-17 | Disposition: A | Discharge status: Home / Self Care | Payer: Medicare Other | Source: Ambulatory Visit | Attending: Internal Medicine | Admitting: Internal Medicine

## 2017-05-17 ENCOUNTER — Inpatient Hospital Stay: Payer: Medicare Other | Attending: Internal Medicine

## 2017-05-17 DIAGNOSIS — I700 Atherosclerosis of aorta: Secondary | ICD-10-CM | POA: Diagnosis not present

## 2017-05-17 DIAGNOSIS — I319 Disease of pericardium, unspecified: Secondary | ICD-10-CM | POA: Insufficient documentation

## 2017-05-17 DIAGNOSIS — Z85118 Personal history of other malignant neoplasm of bronchus and lung: Secondary | ICD-10-CM | POA: Diagnosis not present

## 2017-05-17 DIAGNOSIS — J449 Chronic obstructive pulmonary disease, unspecified: Secondary | ICD-10-CM

## 2017-05-17 DIAGNOSIS — J439 Emphysema, unspecified: Secondary | ICD-10-CM | POA: Diagnosis not present

## 2017-05-17 DIAGNOSIS — C3490 Malignant neoplasm of unspecified part of unspecified bronchus or lung: Secondary | ICD-10-CM

## 2017-05-17 LAB — COMPREHENSIVE METABOLIC PANEL
ALT: 8 U/L (ref 0–55)
AST: 17 U/L (ref 5–34)
Albumin: 4 g/dL (ref 3.5–5.0)
Alkaline Phosphatase: 100 U/L (ref 40–150)
Anion gap: 8 (ref 3–11)
BUN: 13 mg/dL (ref 7–26)
CO2: 29 mmol/L (ref 22–29)
Calcium: 10.6 mg/dL — ABNORMAL HIGH (ref 8.4–10.4)
Chloride: 103 mmol/L (ref 98–109)
Creatinine, Ser: 0.95 mg/dL (ref 0.60–1.10)
GFR calc Af Amer: >60 mL/min (ref >60)
GFR calc non Af Amer: 59 mL/min — ABNORMAL LOW (ref >60)
Glucose, Bld: 74 mg/dL (ref 70–140)
Potassium: 4 mmol/L (ref 3.5–5.1)
Sodium: 140 mmol/L (ref 136–145)
Total Bilirubin: 0.4 mg/dL (ref 0.2–1.2)
Total Protein: 7.6 g/dL (ref 6.4–8.3)

## 2017-05-17 LAB — CBC WITH DIFFERENTIAL/PLATELET
Basophils Absolute: 0.1 10*3/uL (ref 0.0–0.1)
Basophils Relative: 1 %
Eosinophils Absolute: 0 10*3/uL (ref 0.0–0.5)
Eosinophils Relative: 1 %
HCT: 38.1 % (ref 34.8–46.6)
Hemoglobin: 12.6 g/dL (ref 11.6–15.9)
Lymphocytes Relative: 13 %
Lymphs Abs: 0.6 10*3/uL — ABNORMAL LOW (ref 0.9–3.3)
MCH: 30.5 pg (ref 25.1–34.0)
MCHC: 33 g/dL (ref 31.5–36.0)
MCV: 92.4 fL (ref 79.5–101.0)
Monocytes Absolute: 0.8 10*3/uL (ref 0.1–0.9)
Monocytes Relative: 16 %
Neutro Abs: 3.4 10*3/uL (ref 1.5–6.5)
Neutrophils Relative %: 69 %
Platelets: 150 10*3/uL (ref 145–400)
RBC: 4.12 MIL/uL (ref 3.70–5.45)
RDW: 14.5 % (ref 11.2–14.5)
WBC: 4.9 10*3/uL (ref 3.9–10.3)

## 2017-05-17 MED ORDER — SODIUM CHLORIDE 0.9 % IJ SOLN
INTRAMUSCULAR | Status: AC
  Filled 2017-05-17: qty 50

## 2017-05-17 MED ORDER — IOPAMIDOL (ISOVUE-300) INJECTION 61%
75.0000 mL | Freq: Once | INTRAVENOUS | Status: AC | PRN
  Administered 2017-05-17: 75 mL via INTRAVENOUS

## 2017-05-17 MED ORDER — IOPAMIDOL (ISOVUE-300) INJECTION 61%
INTRAVENOUS | Status: AC
  Filled 2017-05-17: qty 75

## 2017-05-18 ENCOUNTER — Inpatient Hospital Stay (HOSPITAL_BASED_OUTPATIENT_CLINIC_OR_DEPARTMENT_OTHER): Payer: Medicare Other | Admitting: Internal Medicine

## 2017-05-18 ENCOUNTER — Encounter: Payer: Self-pay | Admitting: Internal Medicine

## 2017-05-18 ENCOUNTER — Telehealth: Payer: Self-pay | Admitting: Internal Medicine

## 2017-05-18 VITALS — BP 154/79 | HR 88 | Temp 98.0°F | Resp 18 | Ht 62.0 in | Wt 139.1 lb

## 2017-05-18 DIAGNOSIS — Z85118 Personal history of other malignant neoplasm of bronchus and lung: Secondary | ICD-10-CM

## 2017-05-18 DIAGNOSIS — C3490 Malignant neoplasm of unspecified part of unspecified bronchus or lung: Secondary | ICD-10-CM

## 2017-05-18 NOTE — Telephone Encounter (Signed)
Scheduled appts printed AVS/Calendar per 2/19 los

## 2017-05-18 NOTE — Progress Notes (Signed)
[place] Telephone:(336) [phone]   Fax:(336) [place], MD [address] [place] [address]  PRINCIPAL DIAGNOSIS: recurrent small cell lung cancer initially diagnosed as Limited stage small cell lung cancer in June 2011, with recurrence in June 2015.   PRIOR THERAPY:  1. Status post 4 cycles of systemic chemotherapy with carboplatin and etoposide. Last dose was given 11/14/2009. This was concurrent with radiotherapy. 2. Status post prophylactic cranial irradiation completed January 28, 2010. 3. Status post wedge resection of the right upper lobe lung nodule under the care of Dr. Cyndia Bent on 09/21/2013 and the final pathology was consistent with small cell lung cancer. 4. systemic chemotherapy with carboplatin for AUC of 5 on day 1 and etoposide 120 mg/M2 on days 1, 2 and 3 with Neulasta support on day 4 every 3 weeks. Status post 4 cycles. First dose on 10/30/2013.  CURRENT THERAPY: Observation.  INTERVAL HISTORY: Claudia [name] 72 y.o. female returns to the clinic today for 6 months follow-up visit accompanied by her husband.  The patient is feeling fine today with no specific complaints.  She denied having any chest pain, shortness breath, cough or hemoptysis.  She denied having any weight loss or night sweats.  She has no nausea, vomiting, diarrhea or constipation.  She had repeat CT scan of the chest performed recently and she is here for evaluation and discussion of her scan results.  MEDICAL HISTORY: Past Medical History:  Diagnosis Date  . Anemia   . Arthritis   . Bradycardia    mild,may be due to beta blocker therapy  . COPD (chronic obstructive pulmonary disease) ([place])   . GERD (gastroesophageal reflux disease)    otc  . Local recurrence of lung cancer ([place]) dx'd 07/2013   rt thoracotomy chemo comp 12/2013  . Lung cancer [facility]) 2010   Dr. Julien Nordmann, finished chemo, sp radiation, left upper   .  Lymphocytic colitis   . Pneumonia     ALLERGIES:  is allergic to azithromycin and tussionex pennkinetic er [hydrocod polst-cpm polst er].  MEDICATIONS:  Current Outpatient Medications  Medication Sig Dispense Refill  . albuterol (PROVENTIL HFA;VENTOLIN HFA) 108 (90 BASE) MCG/ACT inhaler Inhale 2 puffs into the lungs every 6 (six) hours as needed for wheezing or shortness of breath. (Patient not taking: Reported on 11/04/2016) 1 Inhaler 6  . co-enzyme Q-10 30 MG capsule Take 30 mg by mouth daily.    . fluticasone (FLONASE) 50 MCG/ACT nasal spray Place 2 sprays into both nostrils daily. 16 g 4  . Fluticasone Furoate-Vilanterol (BREO ELLIPTA) 100-25 MCG/INH AEPB Inhale 1 puff into the lungs daily. 1 each 5  . Multiple Vitamins-Minerals (WOMENS 50+ MULTI VITAMIN/MIN PO) Take 1 each by mouth daily.    . Probiotic Product (PROBIOTIC DAILY PO) Take 1 each by mouth daily.     No current facility-administered medications for this visit.     SURGICAL HISTORY:  Past Surgical History:  Procedure Laterality Date  . BRONCHOSCOPY  2011  . DILATION AND CURETTAGE OF UTERUS    . NECK SURGERY  1980's  . THORACOTOMY Right 09/21/2013   Procedure: THORACOTOMY MAJOR;  Surgeon: Gaye Pollack, MD;  Location: [place];  Service: Thoracic;  Laterality: Right;  . UTERINE FIBROID SURGERY  2012  . WEDGE RESECTION Right 09/21/2013   Procedure: RIGHT UPPER LOBE WEDGE RESECTION;  Surgeon: Gaye Pollack, MD;  Location: [place];  Service:  Thoracic;  Laterality: Right;    REVIEW OF SYSTEMS:  A comprehensive review of systems was negative.   PHYSICAL EXAMINATION: General appearance: alert, cooperative and no distress Head: Normocephalic, without obvious abnormality, atraumatic Neck: no adenopathy Lymph nodes: Cervical, supraclavicular, and axillary nodes normal. Resp: clear to auscultation bilaterally Back: symmetric, no curvature. ROM normal. No CVA tenderness. Cardio: regular rate and rhythm, S1, S2 normal, no murmur,  click, rub or gallop GI: soft, non-tender; bowel sounds normal; no masses,  no organomegaly Extremities: extremities normal, atraumatic, no cyanosis or edema  ECOG PERFORMANCE STATUS: 0 - Asymptomatic  Blood pressure (!) 154/79, pulse 88, temperature 98 F (36.7 C), temperature source Oral, resp. rate 18, height 5\' 2"  (1.575 m), weight 139 lb 1.6 oz (63.1 kg), SpO2 99 %.  LABORATORY DATA: Lab Results  Component Value Date   WBC 4.9 05/17/2017   HGB 12.6 05/17/2017   HCT 38.1 05/17/2017   MCV 92.4 05/17/2017   PLT 150 05/17/2017      Chemistry      Component Value Date/Time   NA 140 05/17/2017 1316   NA 141 11/02/2016 0919   K 4.0 05/17/2017 1316   K 4.2 11/02/2016 0919   CL 103 05/17/2017 1316   CL 103 08/03/2012 0842   CO2 29 05/17/2017 1316   CO2 29 11/02/2016 0919   BUN 13 05/17/2017 1316   BUN 13.9 11/02/2016 0919   CREATININE 0.95 05/17/2017 1316   CREATININE 1.0 11/02/2016 0919      Component Value Date/Time   CALCIUM 10.6 (H) 05/17/2017 1316   CALCIUM 10.9 (H) 11/02/2016 0919   ALKPHOS 100 05/17/2017 1316   ALKPHOS 78 11/02/2016 0919   AST 17 05/17/2017 1316   AST 19 11/02/2016 0919   ALT 8 05/17/2017 1316   ALT 12 11/02/2016 0919   BILITOT 0.4 05/17/2017 1316   BILITOT 0.47 11/02/2016 0919       RADIOGRAPHIC STUDIES: Ct Chest W Contrast  Result Date: 05/18/2017 CLINICAL DATA:  Followup lung cancer. EXAM: CT CHEST WITH CONTRAST TECHNIQUE: Multidetector CT imaging of the chest was performed during intravenous contrast administration. CONTRAST:  60mL ISOVUE-300 IOPAMIDOL (ISOVUE-300) INJECTION 61% COMPARISON:  11/02/2016 FINDINGS: Cardiovascular: Normal heart size. Aortic atherosclerosis. No pericardial effusion. Mediastinum/Nodes: Normal appearance of the thyroid gland. The trachea appears patent and is midline. The esophagus appears normal. No enlarged mediastinal or hilar lymph nodes. Lungs/Pleura: No pleural effusion identified. Moderate changes of  emphysema. Bandlike area of masslike architectural distortion within the perihilar left lung is again identified compatible with changes of external beam radiation. Clustered areas of nodularity within the anterior left upper lobe identified, image 42 of series 5. Likely post inflammatory or infectious. Similarly, within the posteromedial left lower lobe there is a cluster of nodules with tree-in-bud configuration which are likely due to an inflammatory bronchiolitis with distal impaction. Stable 5 mm posterior right lower lobe lung nodule, image 73 of series 5. Small nonspecific nodules within the anterior right upper lobe are identified and appear unchanged. No suspicious findings identified to suggest residual/recurrent tumor. Upper Abdomen: No acute abnormality. Exophytic cyst arising from upper pole of right kidney again noted. Chronic pneumoperitoneum is again identified. Musculoskeletal: There is no aggressive lytic or sclerotic bone lesions identified. IMPRESSION: 1. Stable post radiation changes in the left hemithorax. No specific findings to suggest residual/recurrence of tumor or metastatic disease to the chest. 2. Aortic Atherosclerosis (ICD10-I70.0) and Emphysema (ICD10-J43.9). 3. Chronic pneumoperitoneum. Electronically Signed   By: Queen Slough.D.  On: 05/18/2017 09:04   ASSESSMENT AND PLAN:  This is a very pleasant 72 years old white female with recurrent small cell lung cancer that was initially diagnosed in June 2011. She underwent systemic chemotherapy was carboplatin and etoposide concurrent with radiation and followed by prophylactic cranial irradiation. The patient was on observation since November 2011 that she had recurrence with right upper lobe nodule in June 2015 status post wedge resection that was consistent with recurrent small cell lung cancer. She was then treated with systemic chemotherapy again with carboplatin and etoposide for 4 cycles. The patient is currently on  observation. Repeat CT scan of the chest showed no concerning findings for disease progression or recurrence. I discussed the scan results with the patient and her husband and recommended for her to continue on observation with repeat CT scan of the chest in 6 months. She was advised to call immediately if she has any concerning symptoms in the interval. For the chronic probably intraperitoneal air, this has been going on for years and previously evaluated by her gynecologist and no clear etiology. All questions were answered. The patient knows to call the clinic with any problems, questions or concerns. We can certainly see the patient much sooner if necessary. I spent 10 minutes counseling the patient face to face. The total time spent in the appointment was 15 minutes.  Disclaimer: This note was dictated with voice recognition software. Similar sounding words can inadvertently be transcribed and may not be corrected upon review.

## 2017-11-23 ENCOUNTER — Encounter (HOSPITAL_COMMUNITY): Payer: Self-pay

## 2017-11-23 ENCOUNTER — Inpatient Hospital Stay: Payer: Medicare Other | Attending: Internal Medicine

## 2017-11-23 ENCOUNTER — Ambulatory Visit (HOSPITAL_COMMUNITY)
Admission: RE | Admit: 2017-11-23 | Discharge: 2017-11-23 | Disposition: A | Discharge status: Home / Self Care | Payer: Medicare Other | Source: Ambulatory Visit | Attending: Internal Medicine | Admitting: Internal Medicine

## 2017-11-23 DIAGNOSIS — Z923 Personal history of irradiation: Secondary | ICD-10-CM | POA: Insufficient documentation

## 2017-11-23 DIAGNOSIS — Z9221 Personal history of antineoplastic chemotherapy: Secondary | ICD-10-CM | POA: Diagnosis not present

## 2017-11-23 DIAGNOSIS — C3490 Malignant neoplasm of unspecified part of unspecified bronchus or lung: Secondary | ICD-10-CM

## 2017-11-23 DIAGNOSIS — Z85118 Personal history of other malignant neoplasm of bronchus and lung: Secondary | ICD-10-CM | POA: Diagnosis not present

## 2017-11-23 DIAGNOSIS — I10 Essential (primary) hypertension: Secondary | ICD-10-CM | POA: Insufficient documentation

## 2017-11-23 DIAGNOSIS — I700 Atherosclerosis of aorta: Secondary | ICD-10-CM | POA: Insufficient documentation

## 2017-11-23 DIAGNOSIS — J439 Emphysema, unspecified: Secondary | ICD-10-CM | POA: Insufficient documentation

## 2017-11-23 LAB — CMP (CANCER CENTER ONLY)
ALT: 10 U/L (ref 0–44)
AST: 16 U/L (ref 15–41)
Albumin: 4 g/dL (ref 3.5–5.0)
Alkaline Phosphatase: 92 U/L (ref 38–126)
Anion gap: 6 (ref 5–15)
BUN: 11 mg/dL (ref 8–23)
CO2: 30 mmol/L (ref 22–32)
Calcium: 10.2 mg/dL (ref 8.9–10.3)
Chloride: 105 mmol/L (ref 98–111)
Creatinine: 0.91 mg/dL (ref 0.44–1.00)
GFR, Est AFR Am: >60 mL/min (ref >60)
GFR, Estimated: >60 mL/min (ref >60)
Glucose, Bld: 89 mg/dL (ref 70–99)
Potassium: 4.3 mmol/L (ref 3.5–5.1)
Sodium: 141 mmol/L (ref 135–145)
Total Bilirubin: 0.5 mg/dL (ref 0.3–1.2)
Total Protein: 7.1 g/dL (ref 6.5–8.1)

## 2017-11-23 LAB — CBC WITH DIFFERENTIAL (CANCER CENTER ONLY)
Basophils Absolute: 0 10*3/uL (ref 0.0–0.1)
Basophils Relative: 0 %
Eosinophils Absolute: 0.1 10*3/uL (ref 0.0–0.5)
Eosinophils Relative: 1 %
HCT: 37.4 % (ref 34.8–46.6)
Hemoglobin: 12.5 g/dL (ref 11.6–15.9)
Lymphocytes Relative: 13 %
Lymphs Abs: 0.6 10*3/uL — ABNORMAL LOW (ref 0.9–3.3)
MCH: 30.9 pg (ref 25.1–34.0)
MCHC: 33.4 g/dL (ref 31.5–36.0)
MCV: 92.6 fL (ref 79.5–101.0)
Monocytes Absolute: 0.8 10*3/uL (ref 0.1–0.9)
Monocytes Relative: 17 %
Neutro Abs: 3.3 10*3/uL (ref 1.5–6.5)
Neutrophils Relative %: 69 %
Platelet Count: 140 10*3/uL — ABNORMAL LOW (ref 145–400)
RBC: 4.04 MIL/uL (ref 3.70–5.45)
RDW: 14.3 % (ref 11.2–14.5)
WBC Count: 4.7 10*3/uL (ref 3.9–10.3)

## 2017-11-23 MED ORDER — IOHEXOL 300 MG/ML  SOLN
75.0000 mL | Freq: Once | INTRAMUSCULAR | Status: AC | PRN
  Administered 2017-11-23: 75 mL via INTRAVENOUS

## 2017-11-24 ENCOUNTER — Encounter: Payer: Self-pay | Admitting: Internal Medicine

## 2017-11-24 ENCOUNTER — Telehealth: Payer: Self-pay | Admitting: Internal Medicine

## 2017-11-24 ENCOUNTER — Inpatient Hospital Stay (HOSPITAL_BASED_OUTPATIENT_CLINIC_OR_DEPARTMENT_OTHER): Payer: Medicare Other | Admitting: Internal Medicine

## 2017-11-24 VITALS — BP 168/85 | HR 71 | Temp 98.2°F | Resp 18 | Ht 62.0 in | Wt 138.7 lb

## 2017-11-24 DIAGNOSIS — I10 Essential (primary) hypertension: Secondary | ICD-10-CM | POA: Diagnosis not present

## 2017-11-24 DIAGNOSIS — Z9221 Personal history of antineoplastic chemotherapy: Secondary | ICD-10-CM | POA: Diagnosis not present

## 2017-11-24 DIAGNOSIS — C3490 Malignant neoplasm of unspecified part of unspecified bronchus or lung: Secondary | ICD-10-CM

## 2017-11-24 DIAGNOSIS — Z85118 Personal history of other malignant neoplasm of bronchus and lung: Secondary | ICD-10-CM | POA: Diagnosis not present

## 2017-11-24 DIAGNOSIS — Z923 Personal history of irradiation: Secondary | ICD-10-CM | POA: Diagnosis not present

## 2017-11-24 NOTE — Telephone Encounter (Signed)
Gave pt avs and calendar of upcoming appts.

## 2017-11-24 NOTE — Progress Notes (Signed)
[place] [phone]   Fax:(336) [place], [place] [address] [place] [address]  PRINCIPAL DIAGNOSIS: recurrent small cell lung cancer initially diagnosed as Limited stage small cell lung cancer in June 2011, with recurrence in June 2015.   PRIOR THERAPY:  1. Status post 4 cycles of systemic chemotherapy with carboplatin and etoposide. Last dose was given 11/14/2009. This was concurrent with radiotherapy. 2. Status post prophylactic cranial irradiation completed January 28, 2010. 3. Status post wedge resection of the right upper lobe lung nodule under the care of Dr. Cyndia Bent on 09/21/2013 and the final pathology was consistent with small cell lung cancer. 4. systemic chemotherapy with carboplatin for AUC of 5 on day 1 and etoposide 120 mg/M2 on days 1, 2 and 3 with Neulasta support on day 4 every 3 weeks. Status post 4 cycles. First dose on 10/30/2013.  CURRENT THERAPY: Observation.  INTERVAL HISTORY: Claudia [name] 72 y.o. female returns to the clinic today for six-month follow-up visit accompanied by her husband.  The patient is feeling fine today with no concerning complaints.  She denied having any chest pain, shortness of breath, cough or hemoptysis.  She denied having any fever or chills.  She has no nausea, vomiting, diarrhea or constipation.  She had repeat CT scan of the chest performed recently and she is here for evaluation and discussion of her risk her results.  MEDICAL HISTORY: Past Medical History:  Diagnosis Date  . Anemia   . Arthritis   . Bradycardia    mild,may be due to beta blocker therapy  . COPD (chronic obstructive pulmonary disease) ([place])   . GERD (gastroesophageal reflux disease)    otc  . Local recurrence of lung cancer ([place]) dx'd 07/2013   rt thoracotomy chemo comp 12/2013  . Lung cancer [facility]) 2010   Dr. Julien Nordmann, finished chemo, sp radiation, left upper   .  Lymphocytic colitis   . Pneumonia     ALLERGIES:  is allergic to azithromycin and tussionex pennkinetic er [hydrocod polst-cpm polst er].  MEDICATIONS:  Current Outpatient Medications  Medication Sig Dispense Refill  . calcium citrate-vitamin D (CITRACAL+D) 315-200 MG-UNIT tablet Take 1 tablet by mouth 2 (two) times daily.    Marland Kitchen albuterol (PROVENTIL HFA;VENTOLIN HFA) 108 (90 BASE) MCG/ACT inhaler Inhale 2 puffs into the lungs every 6 (six) hours as needed for wheezing or shortness of breath. (Patient not taking: Reported on 11/04/2016) 1 Inhaler 6  . co-enzyme Q-10 30 MG capsule Take 30 mg by mouth daily.    . fluticasone (FLONASE) 50 MCG/ACT nasal spray Place 2 sprays into both nostrils daily. 16 g 4  . Fluticasone Furoate-Vilanterol (BREO ELLIPTA) 100-25 MCG/INH AEPB Inhale 1 puff into the lungs daily. 1 each 5  . Probiotic Product (PROBIOTIC DAILY PO) Take 1 each by mouth daily.     No current facility-administered medications for this visit.     SURGICAL HISTORY:  Past Surgical History:  Procedure Laterality Date  . BRONCHOSCOPY  2011  . DILATION AND CURETTAGE OF UTERUS    . NECK SURGERY  1980's  . THORACOTOMY Right 09/21/2013   Procedure: THORACOTOMY MAJOR;  Surgeon: Gaye Pollack, MD;  Location: [facility];  Service: Thoracic;  Laterality: Right;  . UTERINE FIBROID SURGERY  2012  . WEDGE RESECTION Right 09/21/2013   Procedure: RIGHT UPPER LOBE WEDGE RESECTION;  Surgeon: Gaye Pollack, MD;  Location: [facility];  Service: Thoracic;  Laterality: Right;    REVIEW OF SYSTEMS:  A comprehensive review of systems was negative.   PHYSICAL EXAMINATION: General appearance: alert, cooperative and no distress Head: Normocephalic, without obvious abnormality, atraumatic Neck: no adenopathy Lymph nodes: Cervical, supraclavicular, and axillary nodes normal. Resp: clear to auscultation bilaterally Back: symmetric, no curvature. ROM normal. No CVA tenderness. Cardio: regular rate and rhythm, S1, S2  normal, no murmur, click, rub or gallop GI: soft, non-tender; bowel sounds normal; no masses,  no organomegaly Extremities: extremities normal, atraumatic, no cyanosis or edema  ECOG PERFORMANCE STATUS: 0 - Asymptomatic  Blood pressure (!) 168/85, pulse 71, temperature 98.2 F (36.8 C), temperature source Oral, resp. rate 18, height 5\' 2"  (1.575 m), weight 138 lb 11.2 oz (62.9 kg), SpO2 98 %.  LABORATORY DATA: Lab Results  Component Value Date   WBC 4.7 11/23/2017   HGB 12.5 11/23/2017   HCT 37.4 11/23/2017   MCV 92.6 11/23/2017   PLT 140 (L) 11/23/2017      Chemistry      Component Value Date/Time   NA 141 11/23/2017 1142   NA 141 11/02/2016 0919   K 4.3 11/23/2017 1142   K 4.2 11/02/2016 0919   CL 105 11/23/2017 1142   CL 103 08/03/2012 0842   CO2 30 11/23/2017 1142   CO2 29 11/02/2016 0919   BUN 11 11/23/2017 1142   BUN 13.9 11/02/2016 0919   CREATININE 0.91 11/23/2017 1142   CREATININE 1.0 11/02/2016 0919      Component Value Date/Time   CALCIUM 10.2 11/23/2017 1142   CALCIUM 10.9 (H) 11/02/2016 0919   ALKPHOS 92 11/23/2017 1142   ALKPHOS 78 11/02/2016 0919   AST 16 11/23/2017 1142   AST 19 11/02/2016 0919   ALT 10 11/23/2017 1142   ALT 12 11/02/2016 0919   BILITOT 0.5 11/23/2017 1142   BILITOT 0.47 11/02/2016 0919       RADIOGRAPHIC STUDIES: Ct Chest W Contrast  Result Date: 11/24/2017 CLINICAL DATA:  Lung cancer, chemotherapy and radiation therapy complete. EXAM: CT CHEST WITH CONTRAST TECHNIQUE: Multidetector CT imaging of the chest was performed during intravenous contrast administration. CONTRAST:  44mL OMNIPAQUE IOHEXOL 300 MG/ML  SOLN COMPARISON:  05/17/2017. FINDINGS: Cardiovascular: Atherosclerotic calcification of the arterial vasculature. Heart size normal. No pericardial effusion. Mediastinum/Nodes: No pathologically enlarged mediastinal, hilar or axillary lymph nodes. Prepericardiac lymph nodes are not enlarged by CT size criteria. Esophagus is  grossly unremarkable. Lungs/Pleura: Biapical pleuroparenchymal scarring. Moderate centrilobular emphysema. Post radiation parenchymal consolidation/retraction and bronchiectasis in the upper/mid left hemithorax, unchanged. Pulmonary nodules predominate in the lower lobes, measuring up to 5 mm, as before. Postoperative changes in the anterior right hemithorax. No pleural fluid. Upper Abdomen: Visualized portions of the liver, gallbladder and adrenal glands are unremarkable. Low-attenuation lesions in the kidneys measure up to 1.5 cm, as before. Largest lesion measures 22 Hounsfield units and may represent a mildly complex cyst although definitive characterization is limited without precontrast imaging. Visualized portions of the spleen, pancreas, stomach and bowel are grossly unremarkable. No upper abdominal adenopathy. Locules of pneumoperitoneum are again seen and chronic. Musculoskeletal: No worrisome lytic or sclerotic lesions. IMPRESSION: 1. Post radiation scarring in the left hemithorax, stable. No evidence of recurrent or metastatic disease. 2. Pulmonary nodules measure 5 mm or less in size, stable. 3.  Aortic atherosclerosis (ICD10-170.0). 4.  Emphysema (ICD10-J43.9). 5. Chronic pneumoperitoneum. Electronically Signed   By: Lorin Picket M.D.   On: 11/24/2017 09:30   ASSESSMENT AND PLAN:  This is a very pleasant 72 years old white female with recurrent small cell lung cancer that was initially diagnosed in June 2011. She underwent systemic chemotherapy was carboplatin and etoposide concurrent with radiation and followed by prophylactic cranial irradiation. The patient was on observation since November 2011 that she had recurrence with right upper lobe nodule in June 2015 status post wedge resection that was consistent with recurrent small cell lung cancer. She was then treated with systemic chemotherapy again with carboplatin and etoposide for 4 cycles. She has been in observation since that time and  she is feeling fine. Repeat CT scan of the chest showed no concerning findings for disease recurrence or progression. I discussed the scan results with the patient and her husband I recommended for her to continue on observation with repeat CT scan of the chest in 6 months. For hypertension, she will continue with her blood pressure medication and monitor it closely at home. For the chronic probably intraperitoneal air, this has been going on for years and previously evaluated by her gynecologist and no clear etiology. The patient was advised to call immediately if she has any concerning symptoms in the interval. All questions were answered. The patient knows to call the clinic with any problems, questions or concerns. We can certainly see the patient much sooner if necessary. I spent 10 minutes counseling the patient face to face. The total time spent in the appointment was 15 minutes.  Disclaimer: This note was dictated with voice recognition software. Similar sounding words can inadvertently be transcribed and may not be corrected upon review.

## 2018-05-27 ENCOUNTER — Ambulatory Visit (HOSPITAL_COMMUNITY)
Admission: RE | Admit: 2018-05-27 | Discharge: 2018-05-27 | Disposition: A | Discharge status: Home / Self Care | Payer: Medicare Other | Source: Ambulatory Visit | Attending: Internal Medicine | Admitting: Internal Medicine

## 2018-05-27 ENCOUNTER — Inpatient Hospital Stay: Payer: Medicare Other | Attending: Internal Medicine

## 2018-05-27 DIAGNOSIS — Z923 Personal history of irradiation: Secondary | ICD-10-CM | POA: Insufficient documentation

## 2018-05-27 DIAGNOSIS — C3490 Malignant neoplasm of unspecified part of unspecified bronchus or lung: Secondary | ICD-10-CM | POA: Insufficient documentation

## 2018-05-27 DIAGNOSIS — I10 Essential (primary) hypertension: Secondary | ICD-10-CM | POA: Insufficient documentation

## 2018-05-27 DIAGNOSIS — Z85118 Personal history of other malignant neoplasm of bronchus and lung: Secondary | ICD-10-CM | POA: Insufficient documentation

## 2018-05-27 DIAGNOSIS — Z9221 Personal history of antineoplastic chemotherapy: Secondary | ICD-10-CM | POA: Diagnosis not present

## 2018-05-27 LAB — CBC WITH DIFFERENTIAL (CANCER CENTER ONLY)
Abs Immature Granulocytes: 0.03 10*3/uL (ref 0.00–0.07)
Basophils Absolute: 0 10*3/uL (ref 0.0–0.1)
Basophils Relative: 1 %
Eosinophils Absolute: 0.1 10*3/uL (ref 0.0–0.5)
Eosinophils Relative: 3 %
HCT: 39.7 % (ref 36.0–46.0)
Hemoglobin: 13 g/dL (ref 12.0–15.0)
Immature Granulocytes: 1 %
Lymphocytes Relative: 11 %
Lymphs Abs: 0.4 10*3/uL — ABNORMAL LOW (ref 0.7–4.0)
MCH: 30.5 pg (ref 26.0–34.0)
MCHC: 32.7 g/dL (ref 30.0–36.0)
MCV: 93.2 fL (ref 80.0–100.0)
Monocytes Absolute: 0.5 10*3/uL (ref 0.1–1.0)
Monocytes Relative: 14 %
Neutro Abs: 2.7 10*3/uL (ref 1.7–7.7)
Neutrophils Relative %: 70 %
Platelet Count: 154 10*3/uL (ref 150–400)
RBC: 4.26 MIL/uL (ref 3.87–5.11)
RDW: 14 % (ref 11.5–15.5)
WBC Count: 3.9 10*3/uL — ABNORMAL LOW (ref 4.0–10.5)
nRBC: 0 % (ref 0.0–0.2)

## 2018-05-27 LAB — CMP (CANCER CENTER ONLY)
ALT: 10 U/L (ref 0–44)
AST: 16 U/L (ref 15–41)
Albumin: 3.9 g/dL (ref 3.5–5.0)
Alkaline Phosphatase: 95 U/L (ref 38–126)
Anion gap: 7 (ref 5–15)
BUN: 12 mg/dL (ref 8–23)
CO2: 29 mmol/L (ref 22–32)
Calcium: 10.3 mg/dL (ref 8.9–10.3)
Chloride: 106 mmol/L (ref 98–111)
Creatinine: 0.91 mg/dL (ref 0.44–1.00)
GFR, Est AFR Am: >60 mL/min (ref >60)
GFR, Estimated: >60 mL/min (ref >60)
Glucose, Bld: 92 mg/dL (ref 70–99)
Potassium: 4 mmol/L (ref 3.5–5.1)
Sodium: 142 mmol/L (ref 135–145)
Total Bilirubin: 0.6 mg/dL (ref 0.3–1.2)
Total Protein: 7.4 g/dL (ref 6.5–8.1)

## 2018-05-27 MED ORDER — IOHEXOL 300 MG/ML  SOLN
75.0000 mL | Freq: Once | INTRAMUSCULAR | Status: AC | PRN
  Administered 2018-05-27: 75 mL via INTRAVENOUS

## 2018-05-27 MED ORDER — SODIUM CHLORIDE (PF) 0.9 % IJ SOLN
INTRAMUSCULAR | Status: AC
  Filled 2018-05-27: qty 50

## 2018-05-31 ENCOUNTER — Encounter: Payer: Self-pay | Admitting: Internal Medicine

## 2018-05-31 ENCOUNTER — Telehealth: Payer: Self-pay | Admitting: Internal Medicine

## 2018-05-31 ENCOUNTER — Inpatient Hospital Stay: Payer: Medicare Other | Attending: Internal Medicine | Admitting: Internal Medicine

## 2018-05-31 VITALS — BP 139/75 | HR 101 | Temp 98.0°F | Resp 20 | Ht 62.0 in | Wt 138.3 lb

## 2018-05-31 DIAGNOSIS — J449 Chronic obstructive pulmonary disease, unspecified: Secondary | ICD-10-CM | POA: Insufficient documentation

## 2018-05-31 DIAGNOSIS — Z902 Acquired absence of lung [part of]: Secondary | ICD-10-CM | POA: Insufficient documentation

## 2018-05-31 DIAGNOSIS — Z923 Personal history of irradiation: Secondary | ICD-10-CM | POA: Insufficient documentation

## 2018-05-31 DIAGNOSIS — Z9221 Personal history of antineoplastic chemotherapy: Secondary | ICD-10-CM | POA: Diagnosis not present

## 2018-05-31 DIAGNOSIS — Z85118 Personal history of other malignant neoplasm of bronchus and lung: Secondary | ICD-10-CM | POA: Diagnosis not present

## 2018-05-31 DIAGNOSIS — C3490 Malignant neoplasm of unspecified part of unspecified bronchus or lung: Secondary | ICD-10-CM

## 2018-05-31 DIAGNOSIS — Z79899 Other long term (current) drug therapy: Secondary | ICD-10-CM | POA: Insufficient documentation

## 2018-05-31 NOTE — Telephone Encounter (Signed)
Gave avs and calendar ° °

## 2018-05-31 NOTE — Progress Notes (Signed)
[place] Telephone:([phone]   Fax:(336) [place], MD [address] [place] [address]  PRINCIPAL DIAGNOSIS: recurrent small cell lung cancer initially diagnosed as Limited stage small cell lung cancer in June 2011, with recurrence in June 2015.   PRIOR THERAPY:  1. Status post 4 cycles of systemic chemotherapy with carboplatin and etoposide. Last dose was given 11/14/2009. This was concurrent with radiotherapy. 2. Status post prophylactic cranial irradiation completed January 28, 2010. 3. Status post wedge resection of the right upper lobe lung nodule under the care of Dr. Cyndia Bent on 09/21/2013 and the final pathology was consistent with small cell lung cancer. 4. systemic chemotherapy with carboplatin for AUC of 5 on day 1 and etoposide 120 mg/M2 on days 1, 2 and 3 with Neulasta support on day 4 every 3 weeks. Status post 4 cycles. First dose on 10/30/2013.  CURRENT THERAPY: Observation.  INTERVAL HISTORY: Claudia [name] 73 y.o. female returns to the clinic today for follow-up visit accompanied by her husband.  The patient is feeling fine today with no concerning complaints except for mild shortness of breath with exertion but no significant chest pain, cough or hemoptysis.  She denied having any recent weight loss or night sweats.  She has no nausea, vomiting, diarrhea or constipation.  She has no headache or visual changes.  She had repeat CT scan of the chest performed recently and she is here for evaluation and discussion of her scan results.   MEDICAL HISTORY: Past Medical History:  Diagnosis Date  . Anemia   . Arthritis   . Bradycardia    mild,may be due to beta blocker therapy  . COPD (chronic obstructive pulmonary disease) ([place])   . GERD (gastroesophageal reflux disease)    otc  . Local recurrence of lung cancer ([place]) dx'd 07/2013   rt thoracotomy chemo comp 12/2013  . Lung cancer [facility])  2010   Dr. Julien Nordmann, finished chemo, sp radiation, left upper   . Lymphocytic colitis   . Pneumonia     ALLERGIES:  is allergic to azithromycin and tussionex pennkinetic er [hydrocod polst-cpm polst er].  MEDICATIONS:  Current Outpatient Medications  Medication Sig Dispense Refill  . albuterol (PROVENTIL HFA;VENTOLIN HFA) 108 (90 BASE) MCG/ACT inhaler Inhale 2 puffs into the lungs every 6 (six) hours as needed for wheezing or shortness of breath. (Patient not taking: Reported on 11/04/2016) 1 Inhaler 6  . calcium citrate-vitamin D (CITRACAL+D) 315-200 MG-UNIT tablet Take 1 tablet by mouth 2 (two) times daily.    Marland Kitchen co-enzyme Q-10 30 MG capsule Take 30 mg by mouth daily.    . fluticasone (FLONASE) 50 MCG/ACT nasal spray Place 2 sprays into both nostrils daily. 16 g 4  . Fluticasone Furoate-Vilanterol (BREO ELLIPTA) 100-25 MCG/INH AEPB Inhale 1 puff into the lungs daily. 1 each 5  . Probiotic Product (PROBIOTIC DAILY PO) Take 1 each by mouth daily.     No current facility-administered medications for this visit.     SURGICAL HISTORY:  Past Surgical History:  Procedure Laterality Date  . BRONCHOSCOPY  2011  . DILATION AND CURETTAGE OF UTERUS    . NECK SURGERY  1980's  . THORACOTOMY Right 09/21/2013   Procedure: THORACOTOMY MAJOR;  Surgeon: Gaye Pollack, MD;  Location: [facility];  Service: Thoracic;  Laterality: Right;  . UTERINE FIBROID SURGERY  2012  . WEDGE RESECTION Right 09/21/2013   Procedure: RIGHT UPPER  LOBE WEDGE RESECTION;  Surgeon: Gaye Pollack, MD;  Location[facility];  Service: Thoracic;  Laterality: Right;    REVIEW OF SYSTEMS:  A comprehensive review of systems was negative except for: Respiratory: positive for dyspnea on exertion   PHYSICAL EXAMINATION: General appearance: alert, cooperative and no distress Head: Normocephalic, without obvious abnormality, atraumatic Neck: no adenopathy Lymph nodes: Cervical, supraclavicular, and axillary nodes normal. Resp: clear to  auscultation bilaterally Back: symmetric, no curvature. ROM normal. No CVA tenderness. Cardio: regular rate and rhythm, S1, S2 normal, no murmur, click, rub or gallop GI: soft, non-tender; bowel sounds normal; no masses,  no organomegaly Extremities: extremities normal, atraumatic, no cyanosis or edema  ECOG PERFORMANCE STATUS: 0 - Asymptomatic  Blood pressure 139/75, pulse (!) 101, temperature 98 F (36.7 C), temperature source Oral, resp. rate 20, height 5\' 2"  (1.575 m), weight 138 lb 4.8 oz (62.7 kg), SpO2 100 %.  LABORATORY DATA: Lab Results  Component Value Date   WBC 3.9 (L) 05/27/2018   HGB 13.0 05/27/2018   HCT 39.7 05/27/2018   MCV 93.2 05/27/2018   PLT 154 05/27/2018      Chemistry      Component Value Date/Time   NA 142 05/27/2018 0923   NA 141 11/02/2016 0919   K 4.0 05/27/2018 0923   K 4.2 11/02/2016 0919   CL 106 05/27/2018 0923   CL 103 08/03/2012 0842   CO2 29 05/27/2018 0923   CO2 29 11/02/2016 0919   BUN 12 05/27/2018 0923   BUN 13.9 11/02/2016 0919   CREATININE 0.91 05/27/2018 0923   CREATININE 1.0 11/02/2016 0919      Component Value Date/Time   CALCIUM 10.3 05/27/2018 0923   CALCIUM 10.9 (H) 11/02/2016 0919   ALKPHOS 95 05/27/2018 0923   ALKPHOS 78 11/02/2016 0919   AST 16 05/27/2018 0923   AST 19 11/02/2016 0919   ALT 10 05/27/2018 0923   ALT 12 11/02/2016 0919   BILITOT 0.6 05/27/2018 0923   BILITOT 0.47 11/02/2016 0919       RADIOGRAPHIC STUDIES: Ct Chest W Contrast  Result Date: 05/27/2018 CLINICAL DATA:  Small-cell lung cancer. Chemotherapy completed 2015 and 2018. Radiation therapy completed in 2011. Partial right lung resection in 2015. Known intraabdominal free air. EXAM: CT CHEST WITH CONTRAST TECHNIQUE: Multidetector CT imaging of the chest was performed during intravenous contrast administration. CONTRAST:  69mL OMNIPAQUE IOHEXOL 300 MG/ML  SOLN COMPARISON:  11/23/2017 FINDINGS: Cardiovascular: Bovine arch. Advanced aortic and  branch vessel atherosclerosis. Normal heart size, without pericardial effusion. No central pulmonary embolism, on this non-dedicated study. Mediastinum/Nodes: No supraclavicular adenopathy. No mediastinal or hilar adenopathy. Lungs/Pleura: Loculated left-sided pleural fluid and thickening superiorly are similar. Moderate centrilobular emphysema. postoperative scarring in the anterior right lung, along the right minor fissure medially. Similar right pulmonary nodules, including at 5 mm in the right lower lobe on image 94/5. Volume loss in the left hemithorax. Basilar predominant left-sided pulmonary nodularity, including at 5 mm on image 80/5, primarily similar. Similar configuration of medial left lung architectural distortion and consolidation with traction bronchiectasis. Nodularity just lateral to the radiation induced consolidation is new at 6 mm on image 69/5, favored to represent mucoid impaction. Upper Abdomen: Chronic small volume pneumoperitoneum. Normal imaged portions of the spleen, stomach, pancreas, gallbladder, adrenal glands. Bilateral too small to characterize renal lesions. An upper pole left renal cyst. An upper pole 1.4 cm right renal lesion measures slightly greater than fluid density but is similar in size to on  the prior. 1.0 cm back in 2016. Abdominal aortic atherosclerosis. Musculoskeletal: No acute osseous abnormality. IMPRESSION: 1. Primarily similar appearance of treatment effects within the medial left hemithorax. No typical findings of recurrent or metastatic disease. 2. Multiple bilateral pulmonary nodules, the vast majority of which are unchanged. A 6 mm nodular density within the left lower lobe laterally is new, but favored to represent an area of mucoid impaction. Recommend attention on follow-up. 3. Aortic atherosclerosis (ICD10-I70.0) and emphysema (ICD10-J43.9). 4. Chronic pneumoperitoneum. 5. Similar size of a right renal lesion. Again favored to represent a minimally complex  cyst. Electronically Signed   By: Abigail Miyamoto M.D.   On: 05/27/2018 16:07   ASSESSMENT AND PLAN:  This is a very pleasant 73 years old white female with recurrent small cell lung cancer that was initially diagnosed in June 2011. She underwent systemic chemotherapy was carboplatin and etoposide concurrent with radiation and followed by prophylactic cranial irradiation. The patient was on observation since November 2011 that she had recurrence with right upper lobe nodule in June 2015 status post wedge resection that was consistent with recurrent small cell lung cancer. She was then treated with systemic chemotherapy again with carboplatin and etoposide for 4 cycles. The patient has been on observation since 2015 and she is doing fine. Repeat CT scan of the chest showed no concerning findings for disease recurrence. I discussed the scan results with the patient and her husband I recommended for her to continue on observation with repeat CT scan of the chest in 6 months. The patient was advised to call immediately if she has any concerning symptoms in the interval. All questions were answered. The patient knows to call the clinic with any problems, questions or concerns. We can certainly see the patient much sooner if necessary. I spent 10 minutes counseling the patient face to face. The total time spent in the appointment was 15 minutes.  Disclaimer: This note was dictated with voice recognition software. Similar sounding words can inadvertently be transcribed and may not be corrected upon review.

## 2018-11-28 ENCOUNTER — Other Ambulatory Visit: Payer: Self-pay

## 2018-11-28 ENCOUNTER — Inpatient Hospital Stay: Payer: Medicare Other | Attending: Internal Medicine

## 2018-11-28 ENCOUNTER — Ambulatory Visit (HOSPITAL_COMMUNITY)
Admission: RE | Admit: 2018-11-28 | Discharge: 2018-11-28 | Disposition: A | Discharge status: Home / Self Care | Payer: Medicare Other | Source: Ambulatory Visit | Attending: Internal Medicine | Admitting: Internal Medicine

## 2018-11-28 DIAGNOSIS — Z85118 Personal history of other malignant neoplasm of bronchus and lung: Secondary | ICD-10-CM | POA: Diagnosis not present

## 2018-11-28 DIAGNOSIS — C3490 Malignant neoplasm of unspecified part of unspecified bronchus or lung: Secondary | ICD-10-CM

## 2018-11-28 DIAGNOSIS — Z923 Personal history of irradiation: Secondary | ICD-10-CM | POA: Diagnosis not present

## 2018-11-28 DIAGNOSIS — Z79899 Other long term (current) drug therapy: Secondary | ICD-10-CM | POA: Insufficient documentation

## 2018-11-28 DIAGNOSIS — Z9221 Personal history of antineoplastic chemotherapy: Secondary | ICD-10-CM | POA: Diagnosis not present

## 2018-11-28 LAB — CMP (CANCER CENTER ONLY)
ALT: 12 U/L (ref 0–44)
AST: 18 U/L (ref 15–41)
Albumin: 4.2 g/dL (ref 3.5–5.0)
Alkaline Phosphatase: 82 U/L (ref 38–126)
Anion gap: 10 (ref 5–15)
BUN: 13 mg/dL (ref 8–23)
CO2: 28 mmol/L (ref 22–32)
Calcium: 10.4 mg/dL — ABNORMAL HIGH (ref 8.9–10.3)
Chloride: 103 mmol/L (ref 98–111)
Creatinine: 0.95 mg/dL (ref 0.44–1.00)
GFR, Est AFR Am: >60 mL/min (ref >60)
GFR, Estimated: 60 mL/min — ABNORMAL LOW (ref >60)
Glucose, Bld: 90 mg/dL (ref 70–99)
Potassium: 3.9 mmol/L (ref 3.5–5.1)
Sodium: 141 mmol/L (ref 135–145)
Total Bilirubin: 0.6 mg/dL (ref 0.3–1.2)
Total Protein: 7.4 g/dL (ref 6.5–8.1)

## 2018-11-28 LAB — CBC WITH DIFFERENTIAL (CANCER CENTER ONLY)
Abs Immature Granulocytes: 0.02 10*3/uL (ref 0.00–0.07)
Basophils Absolute: 0 10*3/uL (ref 0.0–0.1)
Basophils Relative: 1 %
Eosinophils Absolute: 0.1 10*3/uL (ref 0.0–0.5)
Eosinophils Relative: 1 %
HCT: 37.7 % (ref 36.0–46.0)
Hemoglobin: 12.4 g/dL (ref 12.0–15.0)
Immature Granulocytes: 0 %
Lymphocytes Relative: 12 %
Lymphs Abs: 0.6 10*3/uL — ABNORMAL LOW (ref 0.7–4.0)
MCH: 30.3 pg (ref 26.0–34.0)
MCHC: 32.9 g/dL (ref 30.0–36.0)
MCV: 92.2 fL (ref 80.0–100.0)
Monocytes Absolute: 0.8 10*3/uL (ref 0.1–1.0)
Monocytes Relative: 15 %
Neutro Abs: 3.7 10*3/uL (ref 1.7–7.7)
Neutrophils Relative %: 71 %
Platelet Count: 153 10*3/uL (ref 150–400)
RBC: 4.09 MIL/uL (ref 3.87–5.11)
RDW: 13.9 % (ref 11.5–15.5)
WBC Count: 5.2 10*3/uL (ref 4.0–10.5)
nRBC: 0 % (ref 0.0–0.2)

## 2018-11-28 MED ORDER — SODIUM CHLORIDE (PF) 0.9 % IJ SOLN
INTRAMUSCULAR | Status: AC
  Filled 2018-11-28: qty 50

## 2018-11-28 MED ORDER — IOHEXOL 300 MG/ML  SOLN
75.0000 mL | Freq: Once | INTRAMUSCULAR | Status: AC | PRN
  Administered 2018-11-28: 75 mL via INTRAVENOUS

## 2018-11-30 ENCOUNTER — Encounter: Payer: Self-pay | Admitting: Internal Medicine

## 2018-11-30 ENCOUNTER — Inpatient Hospital Stay: Payer: Medicare Other | Attending: Internal Medicine | Admitting: Internal Medicine

## 2018-11-30 ENCOUNTER — Other Ambulatory Visit: Payer: Self-pay

## 2018-11-30 VITALS — BP 154/77 | HR 86 | Temp 98.5°F | Resp 20 | Ht 62.0 in | Wt 138.0 lb

## 2018-11-30 DIAGNOSIS — Z85118 Personal history of other malignant neoplasm of bronchus and lung: Secondary | ICD-10-CM | POA: Diagnosis not present

## 2018-11-30 DIAGNOSIS — I700 Atherosclerosis of aorta: Secondary | ICD-10-CM | POA: Insufficient documentation

## 2018-11-30 DIAGNOSIS — J8410 Pulmonary fibrosis, unspecified: Secondary | ICD-10-CM | POA: Insufficient documentation

## 2018-11-30 DIAGNOSIS — C3490 Malignant neoplasm of unspecified part of unspecified bronchus or lung: Secondary | ICD-10-CM | POA: Diagnosis not present

## 2018-11-30 NOTE — Progress Notes (Signed)
[place] [phone]   Fax:(336) [place], MD [address] [place] [address]  PRINCIPAL DIAGNOSIS: recurrent small cell lung cancer initially diagnosed as Limited stage small cell lung cancer in June 2011, with recurrence in June 2015.   PRIOR THERAPY:  1. Status post 4 cycles of systemic chemotherapy with carboplatin and etoposide. Last dose was given 11/14/2009. This was concurrent with radiotherapy. 2. Status post prophylactic cranial irradiation completed January 28, 2010. 3. Status post wedge resection of the right upper lobe lung nodule under the care of Dr. Cyndia Bent on 09/21/2013 and the final pathology was consistent with small cell lung cancer. 4. systemic chemotherapy with carboplatin for AUC of 5 on day 1 and etoposide 120 mg/M2 on days 1, 2 and 3 with Neulasta support on day 4 every 3 weeks. Status post 4 cycles. First dose on 10/30/2013.  CURRENT THERAPY: Observation.  INTERVAL HISTORY: Claudia [name] 73 y.o. female returns to the clinic today for 6 months follow-up visit.  The patient is feeling fine today with no concerning complaints except for mild fatigue.  She gained few pounds since the last visit.  She denied having any chest pain, shortness of breath, cough or hemoptysis.  The patient has no nausea, vomiting, diarrhea or constipation.  She denied having any headache or visual changes.  She had repeat CT scan of the chest performed recently and she is here for evaluation and discussion of her scan results.  MEDICAL HISTORY: Past Medical History:  Diagnosis Date  . Anemia   . Arthritis   . Bradycardia    mild,may be due to beta blocker therapy  . COPD (chronic obstructive pulmonary disease) ([place])   . GERD (gastroesophageal reflux disease)    otc  . Local recurrence of lung cancer ([place]) dx'd 07/2013   rt thoracotomy chemo comp 12/2013  . Lung cancer [facility]) 2010   Dr.  Julien Nordmann, finished chemo, sp radiation, left upper   . Lymphocytic colitis   . Pneumonia     ALLERGIES:  is allergic to azithromycin and tussionex pennkinetic er [hydrocod polst-cpm polst er].  MEDICATIONS:  Current Outpatient Medications  Medication Sig Dispense Refill  . calcium citrate-vitamin D (CITRACAL+D) 315-200 MG-UNIT tablet Take 1 tablet by mouth 2 (two) times daily.    Marland Kitchen co-enzyme Q-10 30 MG capsule Take 30 mg by mouth daily.    . fluticasone (FLONASE) 50 MCG/ACT nasal spray Place 2 sprays into both nostrils daily. 16 g 4  . OVER THE COUNTER MEDICATION Take 1 tablet by mouth daily.    . Probiotic Product (PROBIOTIC DAILY PO) Take 1 each by mouth daily.     No current facility-administered medications for this visit.     SURGICAL HISTORY:  Past Surgical History:  Procedure Laterality Date  . BRONCHOSCOPY  2011  . DILATION AND CURETTAGE OF UTERUS    . NECK SURGERY  1980's  . THORACOTOMY Right 09/21/2013   Procedure: THORACOTOMY MAJOR;  Surgeon: Gaye Pollack, MD;  Location: [place];  Service: Thoracic;  Laterality: Right;  . UTERINE FIBROID SURGERY  2012  . WEDGE RESECTION Right 09/21/2013   Procedure: RIGHT UPPER LOBE WEDGE RESECTION;  Surgeon: Gaye Pollack, MD;  Location: [facility];  Service: Thoracic;  Laterality: Right;    REVIEW OF SYSTEMS:  A comprehensive review of systems was negative except for: Constitutional: positive for fatigue   PHYSICAL EXAMINATION: General appearance:  alert, cooperative and no distress Head: Normocephalic, without obvious abnormality, atraumatic Neck: no adenopathy Lymph nodes: Cervical, supraclavicular, and axillary nodes normal. Resp: clear to auscultation bilaterally Back: symmetric, no curvature. ROM normal. No CVA tenderness. Cardio: regular rate and rhythm, S1, S2 normal, no murmur, click, rub or gallop GI: soft, non-tender; bowel sounds normal; no masses,  no organomegaly Extremities: extremities normal, atraumatic, no cyanosis or  edema  ECOG PERFORMANCE STATUS: 1 - Symptomatic but completely ambulatory  Blood pressure (!) 154/77, pulse 86, temperature 98.5 F (36.9 C), temperature source Oral, resp. rate 20, height 5\' 2"  (1.575 m), weight 138 lb (62.6 kg), SpO2 98 %.  LABORATORY DATA: Lab Results  Component Value Date   WBC 5.2 11/28/2018   HGB 12.4 11/28/2018   HCT 37.7 11/28/2018   MCV 92.2 11/28/2018   PLT 153 11/28/2018      Chemistry      Component Value Date/Time   NA 141 11/28/2018 1047   NA 141 11/02/2016 0919   K 3.9 11/28/2018 1047   K 4.2 11/02/2016 0919   CL 103 11/28/2018 1047   CL 103 08/03/2012 0842   CO2 28 11/28/2018 1047   CO2 29 11/02/2016 0919   BUN 13 11/28/2018 1047   BUN 13.9 11/02/2016 0919   CREATININE 0.95 11/28/2018 1047   CREATININE 1.0 11/02/2016 0919      Component Value Date/Time   CALCIUM 10.4 (H) 11/28/2018 1047   CALCIUM 10.9 (H) 11/02/2016 0919   ALKPHOS 82 11/28/2018 1047   ALKPHOS 78 11/02/2016 0919   AST 18 11/28/2018 1047   AST 19 11/02/2016 0919   ALT 12 11/28/2018 1047   ALT 12 11/02/2016 0919   BILITOT 0.6 11/28/2018 1047   BILITOT 0.47 11/02/2016 0919       RADIOGRAPHIC STUDIES: Ct Chest W Contrast  Result Date: 11/28/2018 CLINICAL DATA:  73 year old female with history of small cell lung cancer (extensive stage) status post 2-3 cycles of chemotherapy. Assess treatment response. EXAM: CT CHEST WITH CONTRAST TECHNIQUE: Multidetector CT imaging of the chest was performed during intravenous contrast administration. CONTRAST:  65mL OMNIPAQUE IOHEXOL 300 MG/ML  SOLN COMPARISON:  Multiple priors, most recently chest CT 05/27/2018. FINDINGS: Cardiovascular: Heart size is normal. Small amount of pericardial fluid and/or thickening, similar to the prior study, and unlikely to be of any hemodynamic significance at this time. No associated pericardial calcification. Aortic atherosclerosis. No definite coronary artery calcifications. Mediastinum/Nodes: No  pathologically enlarged mediastinal or hilar lymph nodes. Esophagus is unremarkable in appearance. No axillary lymphadenopathy. Lungs/Pleura: Trace amount of left pleural fluid and/or thickening lying dependently, similar to the prior study. Chronic volume loss and mass-like architectural distortion in the paramediastinal aspect of the left mid lung, most confluent in the superior segment of the left lower lobe, most compatible with chronic postradiation mass-like fibrosis, very similar to the prior study. There continues to be some scattered peribronchovascular micronodularity throughout the right lung, similar in size and number to the prior study, with the largest of these nodules in the right lower lobe measuring only 4 mm (axial image 97 of series 5), likely benign no other larger more suspicious pulmonary nodule or mass identified on today's examination. No acute consolidative airspace disease. No right pleural effusion. Diffuse bronchial wall thickening with moderate centrilobular and paraseptal emphysema. Upper Abdomen: Small volume of chronic pneumoperitoneum, similar to numerous prior examinations (this is of uncertain etiology and significance, but is presumably benign, potentially related to peritoneal dialysis or other iatrogenic cause). 1.2 cm  simple cyst in the anterior aspect of the upper pole of the left kidney. Exophytic 1.4 cm intermediate attenuation lesion (38 HU) in the upper pole of the right kidney, incompletely characterize, but stable compared to the recent prior examination. Aortic atherosclerosis. Musculoskeletal: There are no aggressive appearing lytic or blastic lesions noted in the visualized portions of the skeleton. IMPRESSION: 1. Chronic postradiation mass-like fibrosis in the left lung appear stable compared to the prior examination. No findings to suggest locally recurrent or metastatic disease. 2. Multiple tiny pulmonary nodules are again noted throughout the right lung, stable  in size and number compared to the prior examination measuring 4 mm or less in size. These are favored to be benign. 3. Indeterminate lesion in the anterior aspect of the upper pole of the right kidney, stable compared to the recent prior examination, again favored to represent a mildly complex (proteinaceous or hemorrhagic) cyst. Continued attention on follow-up studies is recommended. 4. Diffuse bronchial wall thickening with moderate centrilobular and paraseptal emphysema; imaging findings suggestive of underlying COPD. 5. Aortic atherosclerosis. Aortic Atherosclerosis (ICD10-I70.0) and Emphysema (ICD10-J43.9). Electronically Signed   By: Vinnie Langton M.D.   On: 11/28/2018 13:35   ASSESSMENT AND PLAN:  This is a very pleasant 73 years old white female with recurrent small cell lung cancer that was initially diagnosed in June 2011. She underwent systemic chemotherapy was carboplatin and etoposide concurrent with radiation and followed by prophylactic cranial irradiation. The patient was on observation since November 2011 that she had recurrence with right upper lobe nodule in June 2015 status post wedge resection that was consistent with recurrent small cell lung cancer. She was then treated with systemic chemotherapy again with carboplatin and etoposide for 4 cycles. The patient has been on observation since 2015 and she is doing fine. She had repeat CT scan of the chest performed recently.  I personally and independently reviewed the scans and discussed the results with the patient today. Her scan showed no concerning findings for disease recurrence or progression. I recommended for the patient to continue on observation with repeat CT scan of the chest in 6 months. For hypertension she was advised to monitor her blood pressure closely at home and to report to her primary care physician for adjustment of her medication if needed. She was advised to call immediately if she has any concerning  symptoms in the interval. All questions were answered. The patient knows to call the clinic with any problems, questions or concerns. We can certainly see the patient much sooner if necessary. I spent 10 minutes counseling the patient face to face. The total time spent in the appointment was 15 minutes.  Disclaimer: This note was dictated with voice recognition software. Similar sounding words can inadvertently be transcribed and may not be corrected upon review.

## 2018-12-02 ENCOUNTER — Telehealth: Payer: Self-pay | Admitting: Internal Medicine

## 2018-12-02 NOTE — Telephone Encounter (Signed)
Scheduled appt per 9/2 los - mailed letter with appt date and time

## 2019-05-30 ENCOUNTER — Inpatient Hospital Stay: Payer: Medicare PPO | Attending: Internal Medicine

## 2019-05-30 ENCOUNTER — Ambulatory Visit (HOSPITAL_COMMUNITY)
Admission: RE | Admit: 2019-05-30 | Discharge: 2019-05-30 | Disposition: A | Discharge status: Home / Self Care | Payer: Medicare PPO | Source: Ambulatory Visit | Attending: Internal Medicine | Admitting: Internal Medicine

## 2019-05-30 ENCOUNTER — Other Ambulatory Visit: Payer: Self-pay

## 2019-05-30 DIAGNOSIS — Z923 Personal history of irradiation: Secondary | ICD-10-CM | POA: Insufficient documentation

## 2019-05-30 DIAGNOSIS — Z8701 Personal history of pneumonia (recurrent): Secondary | ICD-10-CM | POA: Insufficient documentation

## 2019-05-30 DIAGNOSIS — Z902 Acquired absence of lung [part of]: Secondary | ICD-10-CM | POA: Diagnosis not present

## 2019-05-30 DIAGNOSIS — I10 Essential (primary) hypertension: Secondary | ICD-10-CM | POA: Diagnosis not present

## 2019-05-30 DIAGNOSIS — Z85118 Personal history of other malignant neoplasm of bronchus and lung: Secondary | ICD-10-CM | POA: Insufficient documentation

## 2019-05-30 DIAGNOSIS — C3490 Malignant neoplasm of unspecified part of unspecified bronchus or lung: Secondary | ICD-10-CM

## 2019-05-30 DIAGNOSIS — Z9221 Personal history of antineoplastic chemotherapy: Secondary | ICD-10-CM | POA: Insufficient documentation

## 2019-05-30 DIAGNOSIS — N289 Disorder of kidney and ureter, unspecified: Secondary | ICD-10-CM | POA: Insufficient documentation

## 2019-05-30 DIAGNOSIS — J449 Chronic obstructive pulmonary disease, unspecified: Secondary | ICD-10-CM | POA: Diagnosis not present

## 2019-05-30 LAB — CMP (CANCER CENTER ONLY)
ALT: 12 U/L (ref 0–44)
AST: 20 U/L (ref 15–41)
Albumin: 4.2 g/dL (ref 3.5–5.0)
Alkaline Phosphatase: 84 U/L (ref 38–126)
Anion gap: 8 (ref 5–15)
BUN: 14 mg/dL (ref 8–23)
CO2: 28 mmol/L (ref 22–32)
Calcium: 10.1 mg/dL (ref 8.9–10.3)
Chloride: 104 mmol/L (ref 98–111)
Creatinine: 0.89 mg/dL (ref 0.44–1.00)
GFR, Est AFR Am: >60 mL/min (ref >60)
GFR, Estimated: >60 mL/min (ref >60)
Glucose, Bld: 97 mg/dL (ref 70–99)
Potassium: 4.1 mmol/L (ref 3.5–5.1)
Sodium: 140 mmol/L (ref 135–145)
Total Bilirubin: 0.6 mg/dL (ref 0.3–1.2)
Total Protein: 7.8 g/dL (ref 6.5–8.1)

## 2019-05-30 LAB — CBC WITH DIFFERENTIAL (CANCER CENTER ONLY)
Abs Immature Granulocytes: 0.02 10*3/uL (ref 0.00–0.07)
Basophils Absolute: 0.1 10*3/uL (ref 0.0–0.1)
Basophils Relative: 1 %
Eosinophils Absolute: 0.1 10*3/uL (ref 0.0–0.5)
Eosinophils Relative: 2 %
HCT: 39.8 % (ref 36.0–46.0)
Hemoglobin: 13 g/dL (ref 12.0–15.0)
Immature Granulocytes: 0 %
Lymphocytes Relative: 12 %
Lymphs Abs: 0.6 10*3/uL — ABNORMAL LOW (ref 0.7–4.0)
MCH: 31 pg (ref 26.0–34.0)
MCHC: 32.7 g/dL (ref 30.0–36.0)
MCV: 95 fL (ref 80.0–100.0)
Monocytes Absolute: 0.7 10*3/uL (ref 0.1–1.0)
Monocytes Relative: 14 %
Neutro Abs: 3.5 10*3/uL (ref 1.7–7.7)
Neutrophils Relative %: 71 %
Platelet Count: 156 10*3/uL (ref 150–400)
RBC: 4.19 MIL/uL (ref 3.87–5.11)
RDW: 14.6 % (ref 11.5–15.5)
WBC Count: 5 10*3/uL (ref 4.0–10.5)
nRBC: 0 % (ref 0.0–0.2)

## 2019-05-30 MED ORDER — IOHEXOL 300 MG/ML  SOLN
75.0000 mL | Freq: Once | INTRAMUSCULAR | Status: AC | PRN
  Administered 2019-05-30: 75 mL via INTRAVENOUS

## 2019-05-30 MED ORDER — SODIUM CHLORIDE (PF) 0.9 % IJ SOLN
INTRAMUSCULAR | Status: AC
  Filled 2019-05-30: qty 50

## 2019-06-01 ENCOUNTER — Inpatient Hospital Stay (HOSPITAL_BASED_OUTPATIENT_CLINIC_OR_DEPARTMENT_OTHER): Payer: Medicare PPO | Admitting: Internal Medicine

## 2019-06-01 ENCOUNTER — Encounter: Payer: Self-pay | Admitting: Internal Medicine

## 2019-06-01 ENCOUNTER — Telehealth: Payer: Self-pay | Admitting: Internal Medicine

## 2019-06-01 ENCOUNTER — Other Ambulatory Visit: Payer: Self-pay

## 2019-06-01 VITALS — BP 140/60 | HR 87 | Temp 98.3°F | Ht 62.0 in | Wt 138.0 lb

## 2019-06-01 DIAGNOSIS — I10 Essential (primary) hypertension: Secondary | ICD-10-CM | POA: Insufficient documentation

## 2019-06-01 DIAGNOSIS — C3490 Malignant neoplasm of unspecified part of unspecified bronchus or lung: Secondary | ICD-10-CM

## 2019-06-01 DIAGNOSIS — N2889 Other specified disorders of kidney and ureter: Secondary | ICD-10-CM

## 2019-06-01 DIAGNOSIS — Z85118 Personal history of other malignant neoplasm of bronchus and lung: Secondary | ICD-10-CM | POA: Diagnosis not present

## 2019-06-01 HISTORY — DX: Essential (primary) hypertension: I10

## 2019-06-01 NOTE — Telephone Encounter (Signed)
Scheduled per los. Gave avs and calendar  

## 2019-06-01 NOTE — Progress Notes (Signed)
[place] Telephone:([phone]   Fax:(336) [place], MD [address] [place] [address]  PRINCIPAL DIAGNOSIS: recurrent small cell lung cancer initially diagnosed as Limited stage small cell lung cancer in June 2011, with recurrence in June 2015.   PRIOR THERAPY:  1. Status post 4 cycles of systemic chemotherapy with carboplatin and etoposide. Last dose was given 11/14/2009. This was concurrent with radiotherapy. 2. Status post prophylactic cranial irradiation completed January 28, 2010. 3. Status post wedge resection of the right upper lobe lung nodule under the care of Dr. Cyndia Bent on 09/21/2013 and the final pathology was consistent with small cell lung cancer. 4. systemic chemotherapy with carboplatin for AUC of 5 on day 1 and etoposide 120 mg/M2 on days 1, 2 and 3 with Neulasta support on day 4 every 3 weeks. Status post 4 cycles. First dose on 10/30/2013.  CURRENT THERAPY: Observation.  INTERVAL HISTORY: Claudia [name] 74 y.o. female returns to the clinic today for 77-month follow-up visit.  The patient is feeling fine today with no concerning complaints.  She denied having any current chest pain, shortness of breath, cough or hemoptysis.  She denied having any fever or chills.  She has no nausea, vomiting, diarrhea or constipation.  She has no recent weight loss or night sweats.  She has no urinary symptoms.  She had repeat CT scan of the chest performed recently and she is here for evaluation and discussion of her risk her results.  MEDICAL HISTORY: Past Medical History:  Diagnosis Date  . Anemia   . Arthritis   . Bradycardia    mild,may be due to beta blocker therapy  . COPD (chronic obstructive pulmonary disease) ([place])   . GERD (gastroesophageal reflux disease)    otc  . Local recurrence of lung cancer ([place]) dx'd 07/2013   rt thoracotomy chemo comp 12/2013  . Lung cancer [facility]) 2010   Dr.  Julien Nordmann, finished chemo, sp radiation, left upper   . Lymphocytic colitis   . Pneumonia     ALLERGIES:  is allergic to azithromycin and tussionex pennkinetic er [hydrocod polst-cpm polst er].  MEDICATIONS:  Current Outpatient Medications  Medication Sig Dispense Refill  . calcium citrate-vitamin D (CITRACAL+D) 315-200 MG-UNIT tablet Take 1 tablet by mouth 2 (two) times daily.    Marland Kitchen co-enzyme Q-10 30 MG capsule Take 30 mg by mouth daily.    . fluticasone (FLONASE) 50 MCG/ACT nasal spray Place 2 sprays into both nostrils daily. 16 g 4  . OVER THE COUNTER MEDICATION Take 1 tablet by mouth daily.    . Probiotic Product (PROBIOTIC DAILY PO) Take 1 each by mouth daily.     No current facility-administered medications for this visit.    SURGICAL HISTORY:  Past Surgical History:  Procedure Laterality Date  . BRONCHOSCOPY  2011  . DILATION AND CURETTAGE OF UTERUS    . NECK SURGERY  1980's  . THORACOTOMY Right 09/21/2013   Procedure: THORACOTOMY MAJOR;  Surgeon: Gaye Pollack, MD;  Location: [place];  Service: Thoracic;  Laterality: Right;  . UTERINE FIBROID SURGERY  2012  . WEDGE RESECTION Right 09/21/2013   Procedure: RIGHT UPPER LOBE WEDGE RESECTION;  Surgeon: Gaye Pollack, MD;  [facility];  Service: Thoracic;  Laterality: Right;    REVIEW OF SYSTEMS:  Constitutional: positive for fatigue Eyes: negative Ears, nose, mouth, throat, and face: negative Respiratory: negative Cardiovascular: negative Gastrointestinal:  negative Genitourinary:negative Integument/breast: negative Hematologic/lymphatic: negative Musculoskeletal:negative Neurological: negative Behavioral/Psych: negative Endocrine: negative Allergic/Immunologic: negative   PHYSICAL EXAMINATION: General appearance: alert, cooperative and no distress Head: Normocephalic, without obvious abnormality, atraumatic Neck: no adenopathy Lymph nodes: Cervical, supraclavicular, and axillary nodes normal. Resp: clear to  auscultation bilaterally Back: symmetric, no curvature. ROM normal. No CVA tenderness. Cardio: regular rate and rhythm, S1, S2 normal, no murmur, click, rub or gallop GI: soft, non-tender; bowel sounds normal; no masses,  no organomegaly Extremities: extremities normal, atraumatic, no cyanosis or edema Neurologic: Alert and oriented X 3, normal strength and tone. Normal symmetric reflexes. Normal coordination and gait  ECOG PERFORMANCE STATUS: 1 - Symptomatic but completely ambulatory  Blood pressure 140/60, pulse 87, temperature 98.3 F (36.8 C), height 5\' 2"  (1.575 m), weight 138 lb (62.6 kg), SpO2 98 %.  LABORATORY DATA: Lab Results  Component Value Date   WBC 5.0 05/30/2019   HGB 13.0 05/30/2019   HCT 39.8 05/30/2019   MCV 95.0 05/30/2019   PLT 156 05/30/2019      Chemistry      Component Value Date/Time   NA 140 05/30/2019 1010   NA 141 11/02/2016 0919   K 4.1 05/30/2019 1010   K 4.2 11/02/2016 0919   CL 104 05/30/2019 1010   CL 103 08/03/2012 0842   CO2 28 05/30/2019 1010   CO2 29 11/02/2016 0919   BUN 14 05/30/2019 1010   BUN 13.9 11/02/2016 0919   CREATININE 0.89 05/30/2019 1010   CREATININE 1.0 11/02/2016 0919      Component Value Date/Time   CALCIUM 10.1 05/30/2019 1010   CALCIUM 10.9 (H) 11/02/2016 0919   ALKPHOS 84 05/30/2019 1010   ALKPHOS 78 11/02/2016 0919   AST 20 05/30/2019 1010   AST 19 11/02/2016 0919   ALT 12 05/30/2019 1010   ALT 12 11/02/2016 0919   BILITOT 0.6 05/30/2019 1010   BILITOT 0.47 11/02/2016 0919       RADIOGRAPHIC STUDIES: CT Chest W Contrast  Result Date: 05/30/2019 CLINICAL DATA:  Small cell lung cancer restaging EXAM: CT CHEST WITH CONTRAST TECHNIQUE: Multidetector CT imaging of the chest was performed during intravenous contrast administration. CONTRAST:  55mL OMNIPAQUE IOHEXOL 300 MG/ML  SOLN COMPARISON:  11/28/2018 FINDINGS: Cardiovascular: Atherosclerotic calcification of the aortic arch and branch vessels with some  moderate stenosis in the proximal left common carotid artery. Mediastinum/Nodes: Leftward shift of cardiac and mediastinal structures due to volume loss. Mildly dilated and gas-filled esophagus, similar to prior. Lungs/Pleura: Centrilobular emphysema. Biapical pleuroparenchymal scarring. Chronic left perihilar consolidation likely from prior radiation therapy. Associated volume loss noted. Postoperative findings medially along the right upper lobe and minor fissure. Scattered right-sided pulmonary nodules are relatively similar back through 12/08/2013 and considered benign. Chronic small amount of left pleural fluid primarily along the upper lobe. No progressive findings to suggest active malignancy. Upper Abdomen: Chronic upper abdominal pneumoperitoneum, as on the prior exam. The patient states that this is a known chronic and benign finding in his case. 1.3 by 1.0 cm mildly hyperdense exophytic lesion of the right kidney upper pole on image 146/2, indeterminate for complex cyst versus small renal cell carcinoma. This lesion has not substantially increased in size over the last 3 years. Small fluid density lesions of the left kidney upper pole are compatible with cysts. Musculoskeletal: Mild thoracic spondylosis. IMPRESSION: 1. No findings of active malignancy. 2. Chronically stable small right-sided pulmonary nodules are unchanged from 2015 and considered benign. 3. Chronic pneumoperitoneum as on numerous  prior exams. 4. 1.3 by 1.0 cm mildly hyperdense exophytic lesion of the right kidney upper pole, indeterminate for complex cyst versus small renal cell carcinoma. This lesion has not substantially increased in size over the last 3 years. If clinically warranted this lesion could be further worked up with renal protocol MRI with and without contrast. 5. Mild thoracic spondylosis. 6. Mildly dilated gas-filled esophagus, query esophageal dysmotility. Aortic Atherosclerosis (ICD10-I70.0) and Emphysema (ICD10-J43.9).  Electronically Signed   By: Van Clines M.D.   On: 05/30/2019 14:33   ASSESSMENT AND PLAN:  This is a very pleasant 74 years old white female with recurrent small cell lung cancer that was initially diagnosed in June 2011. She underwent systemic chemotherapy was carboplatin and etoposide concurrent with radiation and followed by prophylactic cranial irradiation. The patient was on observation since November 2011 that she had recurrence with right upper lobe nodule in June 2015 status post wedge resection that was consistent with recurrent small cell lung cancer. She was then treated with systemic chemotherapy again with carboplatin and etoposide for 4 cycles. She is currently on observation and she is feeling fine today with no concerning complaints. She had repeat CT scan of the chest performed recently.  I personally and independently reviewed the scan images and discussed the results with the patient today. Her scan showed no concerning findings for disease recurrence or metastasis but there was a suspicious exophytic lesion of the right kidney upper pole suspicious for complex cyst versus small renal cell carcinoma. I recommended for the patient to continue on observation with repeat CT scan of the chest in 6 months. For the suspicious renal mass, I will refer the patient to [facility] for evaluation and recommendation regarding her condition. For hypertension she will continue to monitor her blood pressure closely at home and discuss with her primary care physician for adjustment of her medication if needed. The patient was advised to call immediately if she has any concerning symptoms in the interval. All questions were answered. The patient knows to call the clinic with any problems, questions or concerns. We can certainly see the patient much sooner if necessary.  Disclaimer: This note was dictated with voice recognition software. Similar sounding words can inadvertently be  transcribed and may not be corrected upon review.

## 2019-09-19 ENCOUNTER — Telehealth: Payer: Self-pay | Admitting: Medical Oncology

## 2019-09-19 NOTE — Telephone Encounter (Signed)
Here PCP said she needs to get a test for a "virus". She has no idea what test he is referring to . She is on [employer].  I instructed her to call her PCP back.

## 2019-09-20 ENCOUNTER — Emergency Department (HOSPITAL_BASED_OUTPATIENT_CLINIC_OR_DEPARTMENT_OTHER): Payer: Medicare PPO

## 2019-09-20 ENCOUNTER — Encounter (HOSPITAL_BASED_OUTPATIENT_CLINIC_OR_DEPARTMENT_OTHER): Payer: Self-pay

## 2019-09-20 ENCOUNTER — Emergency Department (HOSPITAL_BASED_OUTPATIENT_CLINIC_OR_DEPARTMENT_OTHER)
Admission: EM | Admit: 2019-09-20 | Discharge: 2019-09-20 | Disposition: A | Discharge status: Home / Self Care | Payer: Medicare PPO | Attending: Emergency Medicine | Admitting: Emergency Medicine

## 2019-09-20 ENCOUNTER — Other Ambulatory Visit: Payer: Self-pay

## 2019-09-20 DIAGNOSIS — Z87891 Personal history of nicotine dependence: Secondary | ICD-10-CM | POA: Insufficient documentation

## 2019-09-20 DIAGNOSIS — I10 Essential (primary) hypertension: Secondary | ICD-10-CM | POA: Diagnosis not present

## 2019-09-20 DIAGNOSIS — R05 Cough: Secondary | ICD-10-CM | POA: Insufficient documentation

## 2019-09-20 DIAGNOSIS — Z79899 Other long term (current) drug therapy: Secondary | ICD-10-CM | POA: Diagnosis not present

## 2019-09-20 DIAGNOSIS — J449 Chronic obstructive pulmonary disease, unspecified: Secondary | ICD-10-CM | POA: Insufficient documentation

## 2019-09-20 DIAGNOSIS — Z881 Allergy status to other antibiotic agents status: Secondary | ICD-10-CM | POA: Diagnosis not present

## 2019-09-20 DIAGNOSIS — R059 Cough, unspecified: Secondary | ICD-10-CM

## 2019-09-20 MED ORDER — HYDROCOD POLST-CPM POLST ER 10-8 MG/5ML PO SUER: 5.0000 mL | Freq: Two times a day (BID) | ORAL | 0 refills | Status: DC | PRN

## 2019-09-20 NOTE — ED Provider Notes (Signed)
[facility] EMERGENCY DEPARTMENT Provider Note   CSN: [id number] Arrival date & time: 09/20/19  1032     History Chief Complaint  Patient presents with  . Cough    Claudia [name] is a 74 y.o. female.  74 yo F with a chief complaint of cough.  Has been persistent.  She was diagnosed with RSV and has since had a cough.  Her husband was sick prior to her with a similar illness.  She had viral testing due to her history of lung cancer.  She is on [employer] with minimal improvement.  She is concerned that she may have come down with lobar pneumonia, and had this in the past and was very really quite ill with it requiring hospitalization.  Denies any significant difficulty with breathing.  Has been checking her pulse ox at home was never gone below 92.  The history is provided by the patient and the spouse.  Cough Associated symptoms: no chest pain, no chills, no fever, no headaches, no myalgias, no rhinorrhea, no shortness of breath and no wheezing   Illness Severity:  Moderate Onset quality:  Gradual Duration:  2 weeks Timing:  Constant Progression:  Unchanged Chronicity:  New Associated symptoms: cough   Associated symptoms: no chest pain, no congestion, no fever, no headaches, no myalgias, no nausea, no rhinorrhea, no shortness of breath, no vomiting and no wheezing        Past Medical History:  Diagnosis Date  . Anemia   . Arthritis   . Bradycardia    mild,may be due to beta blocker therapy  . COPD (chronic obstructive pulmonary disease) ([place])   . GERD (gastroesophageal reflux disease)    otc  . Hypertension 06/01/2019  . Local recurrence of lung cancer ([place]) dx'd 07/2013   rt thoracotomy chemo comp 12/2013  . Lung cancer [facility]) 2010   Dr. Julien Nordmann, finished chemo, sp radiation, left upper   . Lymphocytic colitis   . Pneumonia     Patient Active Problem List   Diagnosis Date Noted  . Hypertension 06/01/2019  . Right renal mass 06/01/2019  . Acute  bronchitis 04/30/2015  . Other pancytopenia ([place]) 01/22/2014  . S/P thoracotomy 09/24/2013  . Cough 04/14/2012  . Small cell lung cancer ([place]) 04/10/2011  . COPD (chronic obstructive pulmonary disease) ([place]) 04/09/2011  . Anemia 04/09/2011    Past Surgical History:  Procedure Laterality Date  . BRONCHOSCOPY  2011  . DILATION AND CURETTAGE OF UTERUS    . NECK SURGERY  1980's  . THORACOTOMY Right 09/21/2013   Procedure: THORACOTOMY MAJOR;  Surgeon: Gaye Pollack, MD;  Location: [facility];  Service: Thoracic;  Laterality: Right;  . UTERINE FIBROID SURGERY  2012  . WEDGE RESECTION Right 09/21/2013   Procedure: RIGHT UPPER LOBE WEDGE RESECTION;  Surgeon: Gaye Pollack, MD;  Location: [facility] OR;  Service: Thoracic;  Laterality: Right;     OB History   No obstetric history on file.     Family History  Problem Relation Age of Onset  . Brain cancer Mother        brain cancer  . Emphysema Father   . Diabetes Son   . Heart disease Paternal Grandfather   . Breast cancer Maternal Aunt     Social History   Tobacco Use  . Smoking status: Former Smoker    Packs/day: 1.00    Years: 35.00    Pack years: 35.00    Types: Cigarettes  Quit date: 03/31/2007    Years since quitting: 12.4  . Smokeless tobacco: Never Used  Substance Use Topics  . Alcohol use: Yes    Comment: occassional  . Drug use: No    Home Medications Prior to Admission medications   Medication Sig Start Date End Date Taking? Authorizing Provider  albuterol (VENTOLIN HFA) 108 (90 Base) MCG/ACT inhaler Inhale into the lungs. 09/11/19   [provider]  benzonatate (TESSALON) 200 MG capsule Take 200 mg by mouth 3 (three) times daily as needed. 09/19/19   [provider]  calcium citrate-vitamin D (CITRACAL+D) 315-200 MG-UNIT tablet Take 1 tablet by mouth 2 (two) times daily.    [provider]  chlorpheniramine-HYDROcodone (TUSSIONEX PENNKINETIC ER) 10-8 MG/5ML SUER Take 5 mLs by mouth every 12  (twelve) hours as needed for cough. 09/20/19   Deno Etienne, DO  co-enzyme Q-10 30 MG capsule Take 30 mg by mouth daily.    [provider]  Probiotic Product (PROBIOTIC DAILY PO) Take 1 each by mouth daily.    [provider]    Allergies    Azithromycin and Tussionex pennkinetic er [hydrocod polst-cpm polst er]  Review of Systems   Review of Systems  Constitutional: Negative for chills and fever.  HENT: Negative for congestion and rhinorrhea.   Eyes: Negative for redness and visual disturbance.  Respiratory: Positive for cough. Negative for shortness of breath and wheezing.   Cardiovascular: Negative for chest pain and palpitations.  Gastrointestinal: Negative for nausea and vomiting.  Genitourinary: Negative for dysuria and urgency.  Musculoskeletal: Negative for arthralgias and myalgias.  Skin: Negative for pallor and wound.  Neurological: Negative for dizziness and headaches.    Physical Exam Updated Vital Signs BP 128/69 (BP Location: Right Arm)   Pulse 94   Temp 98 F (36.7 C) (Oral)   Resp 18   Ht 5\' 2"  (1.575 m)   Wt 61.4 kg   SpO2 98%   BMI 24.75 kg/m   Physical Exam Vitals and nursing note reviewed.  Constitutional:      General: She is not in acute distress.    Appearance: She is well-developed. She is not diaphoretic.  HENT:     Head: Normocephalic and atraumatic.  Eyes:     Pupils: Pupils are equal, round, and reactive to light.  Cardiovascular:     Rate and Rhythm: Normal rate and regular rhythm.     Heart sounds: No murmur heard.  No friction rub. No gallop.   Pulmonary:     Effort: Pulmonary effort is normal.     Breath sounds: No wheezing or rales.  Abdominal:     General: There is no distension.     Palpations: Abdomen is soft.     Tenderness: There is no abdominal tenderness.  Musculoskeletal:        General: No tenderness.     Cervical back: Normal range of motion and neck supple.  Skin:    General: Skin is warm and dry.    Neurological:     Mental Status: She is alert and oriented to person, place, and time.  Psychiatric:        Behavior: Behavior normal.     ED Results / Procedures / Treatments   Labs (all labs ordered are listed, but only abnormal results are displayed) Labs Reviewed - No data to display  EKG None  Radiology DG Chest 2 View  Result Date: 09/20/2019 CLINICAL DATA:  Cough EXAM: CHEST - 2 VIEW COMPARISON:  10/11/2013 FINDINGS: Cardiac shadow is within normal limits. Scarring and retraction is noted in the left hilum consistent with prior radiation therapy and scarring. No focal infiltrate or sizable effusion is seen. No bony abnormality is noted. IMPRESSION: Chronic post radiation therapy changes on the left. No acute abnormality is noted. Electronically Signed   By: Inez Catalina M.D.   On: 09/20/2019 12:27    Procedures Procedures (including critical care time)  Medications Ordered in ED Medications - No data to display  ED Course  I have reviewed the triage vital signs and the nursing notes.  Pertinent labs & imaging results that were available during my care of the patient were reviewed by me and considered in my medical decision making (see chart for details).    MDM Rules/Calculators/A&P                          73 yo F with a chief complaint of a cough.  Diagnosed with RSV.  She is well-appearing and nontoxic.  Clear lung sounds for me.  Will obtain a chest x-ray.  Chest x-ray viewed by me without focal infiltrate or pneumothorax.  Discussed results with patient and she would like me to try different cough medicine.  We will have her follow-up with her family doctor in the office.  1:06 PM:  I have discussed the diagnosis/risks/treatment options with the patient and family and believe the pt to be eligible for discharge home to follow-up with PCP. We also discussed returning to the ED immediately if new or worsening sx occur. We discussed the sx which are most concerning  (e.g., sudden worsening pain, fever, inability to tolerate by mouth) that necessitate immediate return. Medications administered to the patient during their visit and any new prescriptions provided to the patient are listed below.  Medications given during this visit Medications - No data to display   The patient appears reasonably screen and/or stabilized for discharge and I doubt any other medical condition or other [facility] requiring further screening, evaluation, or treatment in the ED at this time prior to discharge.   Final Clinical Impression(s) / ED Diagnoses Final diagnoses:  Cough    Rx / DC Orders ED Discharge Orders         Ordered    chlorpheniramine-HYDROcodone (TUSSIONEX PENNKINETIC ER) 10-8 MG/5ML SUER  Every 12 hours PRN     Discontinue  Reprint     09/20/19 [place], [place], DO 09/20/19 1306

## 2019-09-20 NOTE — ED Notes (Signed)
Patient transported to X-ray 

## 2019-09-20 NOTE — Discharge Instructions (Signed)
Follow up with your family doc.  Return for sob or fever

## 2019-09-20 NOTE — ED Triage Notes (Signed)
Pt states that she was recently told that she has RSV, was given antibiotic and prednisone states she took her last medication Saturday or Sunday. Pt c/o constant cough.

## 2019-12-01 ENCOUNTER — Other Ambulatory Visit: Payer: Self-pay

## 2019-12-01 ENCOUNTER — Encounter (HOSPITAL_COMMUNITY): Payer: Self-pay

## 2019-12-01 ENCOUNTER — Ambulatory Visit (HOSPITAL_COMMUNITY)
Admission: RE | Admit: 2019-12-01 | Discharge: 2019-12-01 | Disposition: A | Discharge status: Home / Self Care | Payer: Medicare PPO | Source: Ambulatory Visit | Attending: Internal Medicine | Admitting: Internal Medicine

## 2019-12-01 ENCOUNTER — Inpatient Hospital Stay: Payer: Medicare PPO | Attending: Internal Medicine

## 2019-12-01 DIAGNOSIS — Z85118 Personal history of other malignant neoplasm of bronchus and lung: Secondary | ICD-10-CM | POA: Diagnosis not present

## 2019-12-01 DIAGNOSIS — R911 Solitary pulmonary nodule: Secondary | ICD-10-CM | POA: Insufficient documentation

## 2019-12-01 DIAGNOSIS — I10 Essential (primary) hypertension: Secondary | ICD-10-CM | POA: Insufficient documentation

## 2019-12-01 DIAGNOSIS — Z902 Acquired absence of lung [part of]: Secondary | ICD-10-CM | POA: Diagnosis not present

## 2019-12-01 DIAGNOSIS — Z9221 Personal history of antineoplastic chemotherapy: Secondary | ICD-10-CM | POA: Insufficient documentation

## 2019-12-01 DIAGNOSIS — C3411 Malignant neoplasm of upper lobe, right bronchus or lung: Secondary | ICD-10-CM | POA: Diagnosis not present

## 2019-12-01 DIAGNOSIS — C3490 Malignant neoplasm of unspecified part of unspecified bronchus or lung: Secondary | ICD-10-CM | POA: Diagnosis not present

## 2019-12-01 LAB — CMP (CANCER CENTER ONLY)
ALT: <6 U/L (ref 0–44)
AST: 15 U/L (ref 15–41)
Albumin: 3.9 g/dL (ref 3.5–5.0)
Alkaline Phosphatase: 82 U/L (ref 38–126)
Anion gap: 7 (ref 5–15)
BUN: 12 mg/dL (ref 8–23)
CO2: 30 mmol/L (ref 22–32)
Calcium: 10.2 mg/dL (ref 8.9–10.3)
Chloride: 104 mmol/L (ref 98–111)
Creatinine: 0.84 mg/dL (ref 0.44–1.00)
GFR, Est AFR Am: >60 mL/min (ref >60)
GFR, Estimated: >60 mL/min (ref >60)
Glucose, Bld: 89 mg/dL (ref 70–99)
Potassium: 4.2 mmol/L (ref 3.5–5.1)
Sodium: 141 mmol/L (ref 135–145)
Total Bilirubin: 0.4 mg/dL (ref 0.3–1.2)
Total Protein: 7.2 g/dL (ref 6.5–8.1)

## 2019-12-01 LAB — CBC WITH DIFFERENTIAL (CANCER CENTER ONLY)
Abs Immature Granulocytes: 0.02 10*3/uL (ref 0.00–0.07)
Basophils Absolute: 0 10*3/uL (ref 0.0–0.1)
Basophils Relative: 1 %
Eosinophils Absolute: 0.1 10*3/uL (ref 0.0–0.5)
Eosinophils Relative: 2 %
HCT: 37.9 % (ref 36.0–46.0)
Hemoglobin: 12.5 g/dL (ref 12.0–15.0)
Immature Granulocytes: 0 %
Lymphocytes Relative: 12 %
Lymphs Abs: 0.6 10*3/uL — ABNORMAL LOW (ref 0.7–4.0)
MCH: 31 pg (ref 26.0–34.0)
MCHC: 33 g/dL (ref 30.0–36.0)
MCV: 94 fL (ref 80.0–100.0)
Monocytes Absolute: 0.8 10*3/uL (ref 0.1–1.0)
Monocytes Relative: 15 %
Neutro Abs: 3.5 10*3/uL (ref 1.7–7.7)
Neutrophils Relative %: 70 %
Platelet Count: 143 10*3/uL — ABNORMAL LOW (ref 150–400)
RBC: 4.03 MIL/uL (ref 3.87–5.11)
RDW: 14.2 % (ref 11.5–15.5)
WBC Count: 5 10*3/uL (ref 4.0–10.5)
nRBC: 0 % (ref 0.0–0.2)

## 2019-12-01 MED ORDER — IOHEXOL 300 MG/ML  SOLN
75.0000 mL | Freq: Once | INTRAMUSCULAR | Status: AC | PRN
  Administered 2019-12-01: 75 mL via INTRAVENOUS

## 2019-12-05 ENCOUNTER — Encounter: Payer: Self-pay | Admitting: Internal Medicine

## 2019-12-05 ENCOUNTER — Other Ambulatory Visit: Payer: Self-pay

## 2019-12-05 ENCOUNTER — Inpatient Hospital Stay: Payer: Medicare PPO | Admitting: Internal Medicine

## 2019-12-05 ENCOUNTER — Telehealth: Payer: Self-pay | Admitting: Internal Medicine

## 2019-12-05 VITALS — BP 182/88 | HR 80 | Temp 97.8°F | Resp 18 | Ht 62.0 in | Wt 136.9 lb

## 2019-12-05 DIAGNOSIS — I10 Essential (primary) hypertension: Secondary | ICD-10-CM

## 2019-12-05 DIAGNOSIS — N2889 Other specified disorders of kidney and ureter: Secondary | ICD-10-CM

## 2019-12-05 DIAGNOSIS — Z902 Acquired absence of lung [part of]: Secondary | ICD-10-CM | POA: Diagnosis not present

## 2019-12-05 DIAGNOSIS — C3490 Malignant neoplasm of unspecified part of unspecified bronchus or lung: Secondary | ICD-10-CM

## 2019-12-05 DIAGNOSIS — Z85118 Personal history of other malignant neoplasm of bronchus and lung: Secondary | ICD-10-CM | POA: Diagnosis not present

## 2019-12-05 DIAGNOSIS — R911 Solitary pulmonary nodule: Secondary | ICD-10-CM | POA: Diagnosis not present

## 2019-12-05 DIAGNOSIS — Z9221 Personal history of antineoplastic chemotherapy: Secondary | ICD-10-CM | POA: Diagnosis not present

## 2019-12-05 NOTE — Progress Notes (Signed)
[facility] Telephone:([phone]) [phone]   Fax:(336) [place], MD [address] [place] [address]  PRINCIPAL DIAGNOSIS: recurrent small cell lung cancer initially diagnosed as Limited stage small cell lung cancer in June 2011, with recurrence in June 2015.   PRIOR THERAPY:  1. Status post 4 cycles of systemic chemotherapy with carboplatin and etoposide. Last dose was given 11/14/2009. This was concurrent with radiotherapy. 2. Status post prophylactic cranial irradiation completed January 28, 2010. 3. Status post wedge resection of the right upper lobe lung nodule under the care of Dr. Cyndia Bent on 09/21/2013 and the final pathology was consistent with small cell lung cancer. 4. systemic chemotherapy with carboplatin for AUC of 5 on day 1 and etoposide 120 mg/M2 on days 1, 2 and 3 with Neulasta support on day 4 every 3 weeks. Status post 4 cycles. First dose on 10/30/2013.  CURRENT THERAPY: Observation.  INTERVAL HISTORY: Claudia [name] 74 y.o. female returns to the clinic today for 6 months follow-up visit.  The patient is feeling fine today with no concerning complaints except for anxiety about her scan results when she did it online.  She denied having any chest pain, shortness of breath, cough or hemoptysis.  She was recently treated for upper respiratory viral infection with a course of steroids.  She denied having any nausea, vomiting, diarrhea or constipation.  She has no headache or visual changes.  She has no recent weight loss or night sweats.  The patient had repeat CT scan of the chest performed recently and she is here for evaluation and discussion of her risk her results.  MEDICAL HISTORY: Past Medical History:  Diagnosis Date  . Anemia   . Arthritis   . Bradycardia    mild,may be due to beta blocker therapy  . COPD (chronic obstructive pulmonary disease) ([place])   . GERD (gastroesophageal reflux  disease)    otc  . Hypertension 06/01/2019  . Local recurrence of lung cancer ([place]) dx'd 07/2013   rt thoracotomy chemo comp 12/2013  . Lung cancer [facility]) 2010   Dr. Julien Nordmann, finished chemo, sp radiation, left upper   . Lymphocytic colitis   . Pneumonia     ALLERGIES:  is allergic to azithromycin and tussionex pennkinetic er [hydrocod polst-cpm polst er].  MEDICATIONS:  Current Outpatient Medications  Medication Sig Dispense Refill  . albuterol (VENTOLIN HFA) 108 (90 Base) MCG/ACT inhaler Inhale into the lungs.    . benzonatate (TESSALON) 200 MG capsule Take 200 mg by mouth 3 (three) times daily as needed.    . calcium citrate-vitamin D (CITRACAL+D) 315-200 MG-UNIT tablet Take 1 tablet by mouth 2 (two) times daily.    . chlorpheniramine-HYDROcodone (TUSSIONEX PENNKINETIC ER) 10-8 MG/5ML SUER Take 5 mLs by mouth every 12 (twelve) hours as needed for cough. 50 mL 0  . co-enzyme Q-10 30 MG capsule Take 30 mg by mouth daily.    . Probiotic Product (PROBIOTIC DAILY PO) Take 1 each by mouth daily.     No current facility-administered medications for this visit.    SURGICAL HISTORY:  Past Surgical History:  Procedure Laterality Date  . BRONCHOSCOPY  2011  . DILATION AND CURETTAGE OF UTERUS    . NECK SURGERY  1980's  . THORACOTOMY Right 09/21/2013   Procedure: THORACOTOMY MAJOR;  Surgeon: Gaye Pollack, MD;  Location: [facility] OR;  Service: Thoracic;  Laterality: Right;  . UTERINE FIBROID SURGERY  2012  . WEDGE RESECTION Right 09/21/2013   Procedure: RIGHT UPPER LOBE WEDGE RESECTION;  Surgeon: Gaye Pollack, MD;  Location: [facility] OR;  Service: Thoracic;  Laterality: Right;    REVIEW OF SYSTEMS:  A comprehensive review of systems was negative except for: Constitutional: positive for fatigue   PHYSICAL EXAMINATION: General appearance: alert, cooperative and no distress Head: Normocephalic, without obvious abnormality, atraumatic Neck: no adenopathy Lymph nodes: Cervical, supraclavicular, and  axillary nodes normal. Resp: clear to auscultation bilaterally Back: symmetric, no curvature. ROM normal. No CVA tenderness. Cardio: regular rate and rhythm, S1, S2 normal, no murmur, click, rub or gallop GI: soft, non-tender; bowel sounds normal; no masses,  no organomegaly Extremities: extremities normal, atraumatic, no cyanosis or edema  ECOG PERFORMANCE STATUS: 1 - Symptomatic but completely ambulatory  Blood pressure (!) 182/88, pulse 80, temperature 97.8 F (36.6 C), temperature source Tympanic, resp. rate 18, height 5\' 2"  (1.575 m), weight 136 lb 14.4 oz (62.1 kg), SpO2 97 %.  LABORATORY DATA: Lab Results  Component Value Date   WBC 5.0 12/01/2019   HGB 12.5 12/01/2019   HCT 37.9 12/01/2019   MCV 94.0 12/01/2019   PLT 143 (L) 12/01/2019      Chemistry      Component Value Date/Time   NA 141 12/01/2019 1124   NA 141 11/02/2016 0919   K 4.2 12/01/2019 1124   K 4.2 11/02/2016 0919   CL 104 12/01/2019 1124   CL 103 08/03/2012 0842   CO2 30 12/01/2019 1124   CO2 29 11/02/2016 0919   BUN 12 12/01/2019 1124   BUN 13.9 11/02/2016 0919   CREATININE 0.84 12/01/2019 1124   CREATININE 1.0 11/02/2016 0919      Component Value Date/Time   CALCIUM 10.2 12/01/2019 1124   CALCIUM 10.9 (H) 11/02/2016 0919   ALKPHOS 82 12/01/2019 1124   ALKPHOS 78 11/02/2016 0919   AST 15 12/01/2019 1124   AST 19 11/02/2016 0919   ALT <6 12/01/2019 1124   ALT 12 11/02/2016 0919   BILITOT 0.4 12/01/2019 1124   BILITOT 0.47 11/02/2016 0919       RADIOGRAPHIC STUDIES: CT Chest W Contrast  Result Date: 12/01/2019 CLINICAL DATA:  Recurrent small cell lung cancer originally diagnosed 2011 with recurrence in 2015 with right upper lobe wedge resection June 2015. Restaging. Interval observation. EXAM: CT CHEST WITH CONTRAST TECHNIQUE: Multidetector CT imaging of the chest was performed during intravenous contrast administration. CONTRAST:  60mL OMNIPAQUE IOHEXOL 300 MG/ML  SOLN COMPARISON:   05/30/2019 chest CT. FINDINGS: Cardiovascular: Normal heart size. No significant pericardial effusion/thickening. Atherosclerotic nonaneurysmal thoracic aorta. Normal caliber pulmonary arteries. No central pulmonary emboli. Mediastinum/Nodes: No discrete thyroid nodules. Mildly patulous thoracic esophagus with small debris levels, unchanged. No pathologically enlarged axillary, mediastinal or hilar lymph nodes. Lungs/Pleura: No pneumothorax. Stable trace dependent left pleural effusion. No right pleural effusion. Moderate centrilobular emphysema with diffuse bronchial wall thickening. Stable postsurgical changes from medial right upper lobe wedge resection. New solid 6 mm anterior right upper lobe pulmonary nodule adjacent to chronic mild patchy tree-in-bud opacities (series 5/image 44). New small 4 mm solid anterior basilar left lower lobe pulmonary nodule (series 5/image 112). Previously visualized scattered small right lower lobe pulmonary nodules up to 5 mm (series 5/image 92) are stable. Sharply marginated dense left perihilar consolidation with associated volume loss, bronchiectasis and distortion, unchanged, compatible with radiation fibrosis. Upper abdomen: Hypodense 1.1 cm exophytic renal cortical lesion in the anterior upper right kidney (series 2/image 151) is  stable in size. Simple 1.4 cm anterior upper left renal cyst. Scattered free intraperitoneal air in the upper abdomen bilaterally, similar to the prior scan from 05/30/2019. Musculoskeletal: No aggressive appearing focal osseous lesions. Mild thoracic spondylosis. IMPRESSION: 1. Stable radiation fibrosis in the left perihilar lung with no evidence of local tumor recurrence. 2. New solid 6 mm anterior right upper lobe pulmonary nodule adjacent to chronic mild patchy tree-in-bud opacities. New small 4 mm solid anterior basilar left lower lobe pulmonary nodule. These nodules are indeterminate for metastatic disease and are below PET resolution.  Recommend follow-up chest CT in 3 months. 3. Chronic pneumoperitoneum in the upper abdomen, unchanged from prior CT. 4. Aortic Atherosclerosis (ICD10-I70.0) and Emphysema (ICD10-J43.9). These results will be called to the ordering clinician or representative by the Radiologist Assistant, and communication documented in the PACS or [employer]. Electronically Signed   By: Ilona Sorrel M.D.   On: 12/01/2019 14:50   ASSESSMENT AND PLAN:  This is a very pleasant 74 years old white female with recurrent small cell lung cancer that was initially diagnosed in June 2011. She underwent systemic chemotherapy was carboplatin and etoposide concurrent with radiation and followed by prophylactic cranial irradiation. The patient was on observation since November 2011 that she had recurrence with right upper lobe nodule in June 2015 status post wedge resection that was consistent with recurrent small cell lung cancer. She was then treated with systemic chemotherapy again with carboplatin and etoposide for 4 cycles. The patient has been in observation since that time and she is feeling fine. She had repeat CT scan of the chest performed recently.  Her scan showed no concerning findings for disease recurrence or progression except for new 4 mm solid anterior basilar left lower lobe pulmonary nodule. I discussed the scan result and showed the images to the patient today. I recommended her to continue on observation with repeat CT scan of the chest in 3 months for further evaluation of this nodule. For hypertension, she mentioned that her blood pressure is usually normal at home and she will continue to monitor it closely and discuss with her primary care physician and adjustment of her medication if needed. For the suspicious renal mass, I will refer the [facility] to [facility] for evaluation and recommendation regarding her condition. The patient was advised to call immediately if she has any concerning symptoms  in the interval. All questions were answered. The patient knows to call the clinic with any problems, questions or concerns. We can certainly see the patient much sooner if necessary.  Disclaimer: This note was dictated with voice recognition software. Similar sounding words can inadvertently be transcribed and may not be corrected upon review.

## 2019-12-05 NOTE — Telephone Encounter (Signed)
Scheduled appointments per 9/7 los. Gave patient calendar print out.

## 2019-12-21 DIAGNOSIS — Z23 Encounter for immunization: Secondary | ICD-10-CM | POA: Diagnosis not present

## 2020-03-01 ENCOUNTER — Ambulatory Visit (HOSPITAL_COMMUNITY)
Admission: RE | Admit: 2020-03-01 | Discharge: 2020-03-01 | Disposition: A | Discharge status: Home / Self Care | Payer: Medicare PPO | Source: Ambulatory Visit | Attending: Internal Medicine | Admitting: Internal Medicine

## 2020-03-01 ENCOUNTER — Encounter (HOSPITAL_COMMUNITY): Payer: Self-pay

## 2020-03-01 ENCOUNTER — Other Ambulatory Visit: Payer: Self-pay

## 2020-03-01 ENCOUNTER — Inpatient Hospital Stay: Payer: Medicare PPO | Attending: Internal Medicine

## 2020-03-01 DIAGNOSIS — Z9221 Personal history of antineoplastic chemotherapy: Secondary | ICD-10-CM | POA: Insufficient documentation

## 2020-03-01 DIAGNOSIS — I10 Essential (primary) hypertension: Secondary | ICD-10-CM | POA: Insufficient documentation

## 2020-03-01 DIAGNOSIS — Z85118 Personal history of other malignant neoplasm of bronchus and lung: Secondary | ICD-10-CM | POA: Insufficient documentation

## 2020-03-01 DIAGNOSIS — Z923 Personal history of irradiation: Secondary | ICD-10-CM | POA: Diagnosis not present

## 2020-03-01 DIAGNOSIS — C3490 Malignant neoplasm of unspecified part of unspecified bronchus or lung: Secondary | ICD-10-CM | POA: Diagnosis not present

## 2020-03-01 DIAGNOSIS — J181 Lobar pneumonia, unspecified organism: Secondary | ICD-10-CM | POA: Diagnosis not present

## 2020-03-01 LAB — CBC WITH DIFFERENTIAL (CANCER CENTER ONLY)
Abs Immature Granulocytes: 0.04 10*3/uL (ref 0.00–0.07)
Basophils Absolute: 0.1 10*3/uL (ref 0.0–0.1)
Basophils Relative: 1 %
Eosinophils Absolute: 0.1 10*3/uL (ref 0.0–0.5)
Eosinophils Relative: 1 %
HCT: 38.1 % (ref 36.0–46.0)
Hemoglobin: 12.5 g/dL (ref 12.0–15.0)
Immature Granulocytes: 1 %
Lymphocytes Relative: 9 %
Lymphs Abs: 0.7 10*3/uL (ref 0.7–4.0)
MCH: 30.7 pg (ref 26.0–34.0)
MCHC: 32.8 g/dL (ref 30.0–36.0)
MCV: 93.6 fL (ref 80.0–100.0)
Monocytes Absolute: 0.9 10*3/uL (ref 0.1–1.0)
Monocytes Relative: 12 %
Neutro Abs: 5.5 10*3/uL (ref 1.7–7.7)
Neutrophils Relative %: 76 %
Platelet Count: 155 10*3/uL (ref 150–400)
RBC: 4.07 MIL/uL (ref 3.87–5.11)
RDW: 14.2 % (ref 11.5–15.5)
WBC Count: 7.2 10*3/uL (ref 4.0–10.5)
nRBC: 0 % (ref 0.0–0.2)

## 2020-03-01 LAB — CMP (CANCER CENTER ONLY)
ALT: 7 U/L (ref 0–44)
AST: 16 U/L (ref 15–41)
Albumin: 3.9 g/dL (ref 3.5–5.0)
Alkaline Phosphatase: 80 U/L (ref 38–126)
Anion gap: 7 (ref 5–15)
BUN: 17 mg/dL (ref 8–23)
CO2: 30 mmol/L (ref 22–32)
Calcium: 10.2 mg/dL (ref 8.9–10.3)
Chloride: 104 mmol/L (ref 98–111)
Creatinine: 0.86 mg/dL (ref 0.44–1.00)
GFR, Estimated: >60 mL/min (ref >60)
Glucose, Bld: 91 mg/dL (ref 70–99)
Potassium: 4 mmol/L (ref 3.5–5.1)
Sodium: 141 mmol/L (ref 135–145)
Total Bilirubin: 0.5 mg/dL (ref 0.3–1.2)
Total Protein: 7.3 g/dL (ref 6.5–8.1)

## 2020-03-01 MED ORDER — IOHEXOL 300 MG/ML  SOLN
75.0000 mL | Freq: Once | INTRAMUSCULAR | Status: AC | PRN
  Administered 2020-03-01: 75 mL via INTRAVENOUS

## 2020-03-04 ENCOUNTER — Inpatient Hospital Stay: Payer: Medicare PPO | Admitting: Internal Medicine

## 2020-03-04 ENCOUNTER — Other Ambulatory Visit: Payer: Self-pay

## 2020-03-04 ENCOUNTER — Encounter: Payer: Self-pay | Admitting: Internal Medicine

## 2020-03-04 VITALS — BP 180/75 | HR 91 | Temp 97.8°F | Resp 20 | Ht 62.0 in | Wt 136.5 lb

## 2020-03-04 DIAGNOSIS — C3490 Malignant neoplasm of unspecified part of unspecified bronchus or lung: Secondary | ICD-10-CM

## 2020-03-04 DIAGNOSIS — Z9221 Personal history of antineoplastic chemotherapy: Secondary | ICD-10-CM | POA: Diagnosis not present

## 2020-03-04 DIAGNOSIS — Z85118 Personal history of other malignant neoplasm of bronchus and lung: Secondary | ICD-10-CM | POA: Diagnosis not present

## 2020-03-04 DIAGNOSIS — Z923 Personal history of irradiation: Secondary | ICD-10-CM | POA: Diagnosis not present

## 2020-03-04 DIAGNOSIS — I10 Essential (primary) hypertension: Secondary | ICD-10-CM | POA: Diagnosis not present

## 2020-03-04 NOTE — Progress Notes (Signed)
[place] Telephone:(336) [phone]   Fax:(336) [place], [place] [address] [place] [address]  PRINCIPAL DIAGNOSIS: recurrent small cell lung cancer initially diagnosed as Limited stage small cell lung cancer in June 2011, with recurrence in June 2015.   PRIOR THERAPY:  1. Status post 4 cycles of systemic chemotherapy with carboplatin and etoposide. Last dose was given 11/14/2009. This was concurrent with radiotherapy. 2. Status post prophylactic cranial irradiation completed January 28, 2010. 3. Status post wedge resection of the right upper lobe lung nodule under the care of Dr. Cyndia Bent on 09/21/2013 and the final pathology was consistent with small cell lung cancer. 4. systemic chemotherapy with carboplatin for AUC of 5 on day 1 and etoposide 120 mg/M2 on days 1, 2 and 3 with Neulasta support on day 4 every 3 weeks. Status post 4 cycles. First dose on 10/30/2013.  CURRENT THERAPY: Observation.  INTERVAL HISTORY: Claudia [name] 74 y.o. female returns to the clinic today for 6 months follow-up visit.  The patient is feeling fine today with no concerning complaints.  She denied having any current chest pain, shortness of breath, cough or hemoptysis.  She denied having any fever or chills.  She has no nausea, vomiting, diarrhea or constipation.  She has no headache or visual changes.  She denied having any recent weight loss or night sweats.  The patient is here today for evaluation with repeat CT scan of the chest for restaging of her disease.  MEDICAL HISTORY: Past Medical History:  Diagnosis Date  . Anemia   . Arthritis   . Bradycardia    mild,may be due to beta blocker therapy  . COPD (chronic obstructive pulmonary disease) ([place])   . GERD (gastroesophageal reflux disease)    otc  . Hypertension 06/01/2019  . Local recurrence of lung cancer ([place]) dx'd 07/2013   rt thoracotomy chemo comp 12/2013  . Lung  cancer [facility]) 2010   Dr. Julien Nordmann, finished chemo, sp radiation, left upper   . Lymphocytic colitis   . Pneumonia     ALLERGIES:  is allergic to azithromycin and tussionex pennkinetic er [hydrocod polst-cpm polst er].  MEDICATIONS:  Current Outpatient Medications  Medication Sig Dispense Refill  . albuterol (VENTOLIN HFA) 108 (90 Base) MCG/ACT inhaler Inhale into the lungs.    . benzonatate (TESSALON) 200 MG capsule Take 200 mg by mouth 3 (three) times daily as needed.    . calcium citrate-vitamin D (CITRACAL+D) 315-200 MG-UNIT tablet Take 1 tablet by mouth 2 (two) times daily.    . chlorpheniramine-HYDROcodone (TUSSIONEX PENNKINETIC ER) 10-8 MG/5ML SUER Take 5 mLs by mouth every 12 (twelve) hours as needed for cough. 50 mL 0  . co-enzyme Q-10 30 MG capsule Take 30 mg by mouth daily.    . Probiotic Product (PROBIOTIC DAILY PO) Take 1 each by mouth daily.     No current facility-administered medications for this visit.    SURGICAL HISTORY:  Past Surgical History:  Procedure Laterality Date  . BRONCHOSCOPY  2011  . DILATION AND CURETTAGE OF UTERUS    . NECK SURGERY  1980's  . THORACOTOMY Right 09/21/2013   Procedure: THORACOTOMY MAJOR;  Surgeon: Gaye Pollack, MD;  Location: [facility];  Service: Thoracic;  Laterality: Right;  . UTERINE FIBROID SURGERY  2012  . WEDGE RESECTION Right 09/21/2013   Procedure: RIGHT UPPER LOBE WEDGE RESECTION;  Surgeon: Gaye Pollack, MD;  Location:  [facility];  Service: Thoracic;  Laterality: Right;    REVIEW OF SYSTEMS:  A comprehensive review of systems was negative.   PHYSICAL EXAMINATION: General appearance: alert, cooperative and no distress Head: Normocephalic, without obvious abnormality, atraumatic Neck: no adenopathy Lymph nodes: Cervical, supraclavicular, and axillary nodes normal. Resp: clear to auscultation bilaterally Back: symmetric, no curvature. ROM normal. No CVA tenderness. Cardio: regular rate and rhythm, S1, S2 normal, no murmur, click,  rub or gallop GI: soft, non-tender; bowel sounds normal; no masses,  no organomegaly Extremities: extremities normal, atraumatic, no cyanosis or edema  ECOG PERFORMANCE STATUS: 1 - Symptomatic but completely ambulatory  Blood pressure (!) 180/75, pulse 91, temperature 97.8 F (36.6 C), temperature source Tympanic, resp. rate 20, height 5\' 2"  (1.575 m), weight 136 lb 8 oz (61.9 kg), SpO2 96 %.  LABORATORY DATA: Lab Results  Component Value Date   WBC 7.2 03/01/2020   HGB 12.5 03/01/2020   HCT 38.1 03/01/2020   MCV 93.6 03/01/2020   PLT 155 03/01/2020      Chemistry      Component Value Date/Time   NA 141 03/01/2020 0913   NA 141 11/02/2016 0919   K 4.0 03/01/2020 0913   K 4.2 11/02/2016 0919   CL 104 03/01/2020 0913   CL 103 08/03/2012 0842   CO2 30 03/01/2020 0913   CO2 29 11/02/2016 0919   BUN 17 03/01/2020 0913   BUN 13.9 11/02/2016 0919   CREATININE 0.86 03/01/2020 0913   CREATININE 1.0 11/02/2016 0919      Component Value Date/Time   CALCIUM 10.2 03/01/2020 0913   CALCIUM 10.9 (H) 11/02/2016 0919   ALKPHOS 80 03/01/2020 0913   ALKPHOS 78 11/02/2016 0919   AST 16 03/01/2020 0913   AST 19 11/02/2016 0919   ALT 7 03/01/2020 0913   ALT 12 11/02/2016 0919   BILITOT 0.5 03/01/2020 0913   BILITOT 0.47 11/02/2016 0919       RADIOGRAPHIC STUDIES: CT Chest W Contrast  Result Date: 03/02/2020 CLINICAL DATA:  Small cell lung cancer staging EXAM: CT CHEST WITH CONTRAST TECHNIQUE: Multidetector CT imaging of the chest was performed during intravenous contrast administration. CONTRAST:  18mL OMNIPAQUE IOHEXOL 300 MG/ML  SOLN COMPARISON:  12/01/2019 FINDINGS: Cardiovascular: Aortic atherosclerosis. Normal heart size. No pericardial effusion. Mediastinum/Nodes: No enlarged mediastinal, hilar, or axillary lymph nodes. Patulous esophagus. Thyroid gland and trachea demonstrate no significant findings. Lungs/Pleura: Unchanged post treatment appearance of the left lung, with dense  fibrotic consolidation and volume loss of the perihilar and paramedian lung. Underlying centrilobular emphysema. Additional postoperative findings of previous medial right upper lobe wedge resection. No significant interval change in numerous small pulmonary nodules throughout the lungs, generally clustered centrilobular and tree-in-bud nodules, most conspicuous in the peripheral right upper lobe, an index nodule measuring 6 mm, this nodule new on prior examination (series 7, image 44). An additional 4 mm nodule of the anterior left lung base measuring 4 mm is unchanged, this nodule also new on prior examination (series 7, image 112). No pleural effusion or pneumothorax. Upper Abdomen: No acute abnormality. Redemonstrated pneumoperitoneum about the liver and splenic flexure of the colon (series 2, image 138). Stable small bilateral renal lesions, incompletely imaged and characterized. Musculoskeletal: No chest wall mass or suspicious bone lesions identified. IMPRESSION: 1. Unchanged post treatment appearance of the left lung, with dense fibrotic consolidation and volume loss of the perihilar and paramedian lung. Additional postoperative findings of previous medial right upper lobe wedge resection. 2. No significant  interval change in numerous small pulmonary nodules throughout the lungs, generally clustered centrilobular and tree-in-bud nodules. Small nodules of the right upper lobe and left lower lobe which were new on prior examination are unchanged, as detailed above. Continued attention on follow-up. 3. Redemonstrated pneumoperitoneum about the liver and splenic flexure of the colon, of uncertain etiology but noted on multiple prior examinations. Aortic Atherosclerosis (ICD10-I70.0) and Emphysema (ICD10-J43.9). Electronically Signed   By: Eddie Candle M.D.   On: 03/02/2020 17:01   ASSESSMENT AND PLAN:  This is a very pleasant 74 years old white female with recurrent small cell lung cancer that was initially  diagnosed in June 2011. She underwent systemic chemotherapy was carboplatin and etoposide concurrent with radiation and followed by prophylactic cranial irradiation. The patient was on observation since November 2011 that she had recurrence with right upper lobe nodule in June 2015 status post wedge resection that was consistent with recurrent small cell lung cancer. She was then treated with systemic chemotherapy again with carboplatin and etoposide for 4 cycles. The patient is currently on observation and she is feeling fine today with no concerning complaints. Repeat CT scan of the chest showed no concerning findings for disease recurrence or metastasis. I recommended for her to continue on observation with repeat CT scan of the chest in 6 months. For the hypertension, I strongly encouraged the patient to take her blood pressure medication and to monitor it closely at home and discuss with her primary care physician adjustment of her medications. For the suspicious renal mass she will continue her routine follow-up visit and evaluation by urology. All questions were answered. The patient knows to call the clinic with any problems, questions or concerns. We can certainly see the patient much sooner if necessary.  Disclaimer: This note was dictated with voice recognition software. Similar sounding words can inadvertently be transcribed and may not be corrected upon review.

## 2020-03-07 ENCOUNTER — Telehealth: Payer: Self-pay | Admitting: Internal Medicine

## 2020-03-07 NOTE — Telephone Encounter (Signed)
Rescheduled MD visit per patient's request. Patient normally sees doctor a day after scan so visit was moved to reflect this change.

## 2020-07-01 DIAGNOSIS — H26493 Other secondary cataract, bilateral: Secondary | ICD-10-CM | POA: Diagnosis not present

## 2020-07-01 DIAGNOSIS — G43809 Other migraine, not intractable, without status migrainosus: Secondary | ICD-10-CM | POA: Diagnosis not present

## 2020-07-01 DIAGNOSIS — D3132 Benign neoplasm of left choroid: Secondary | ICD-10-CM | POA: Diagnosis not present

## 2020-07-01 DIAGNOSIS — H40013 Open angle with borderline findings, low risk, bilateral: Secondary | ICD-10-CM | POA: Diagnosis not present

## 2020-08-28 ENCOUNTER — Inpatient Hospital Stay: Payer: Medicare PPO | Attending: Internal Medicine

## 2020-08-28 ENCOUNTER — Ambulatory Visit (HOSPITAL_COMMUNITY)
Admission: RE | Admit: 2020-08-28 | Discharge: 2020-08-28 | Disposition: A | Discharge status: Home / Self Care | Payer: Medicare PPO | Source: Ambulatory Visit | Attending: Internal Medicine | Admitting: Internal Medicine

## 2020-08-28 ENCOUNTER — Ambulatory Visit: Payer: Medicare PPO | Admitting: Internal Medicine

## 2020-08-28 ENCOUNTER — Other Ambulatory Visit: Payer: Self-pay

## 2020-08-28 DIAGNOSIS — Z85118 Personal history of other malignant neoplasm of bronchus and lung: Secondary | ICD-10-CM | POA: Insufficient documentation

## 2020-08-28 DIAGNOSIS — Z9221 Personal history of antineoplastic chemotherapy: Secondary | ICD-10-CM | POA: Insufficient documentation

## 2020-08-28 DIAGNOSIS — N289 Disorder of kidney and ureter, unspecified: Secondary | ICD-10-CM | POA: Insufficient documentation

## 2020-08-28 DIAGNOSIS — C3490 Malignant neoplasm of unspecified part of unspecified bronchus or lung: Secondary | ICD-10-CM | POA: Diagnosis not present

## 2020-08-28 DIAGNOSIS — J90 Pleural effusion, not elsewhere classified: Secondary | ICD-10-CM | POA: Diagnosis not present

## 2020-08-28 DIAGNOSIS — J438 Other emphysema: Secondary | ICD-10-CM | POA: Diagnosis not present

## 2020-08-28 DIAGNOSIS — J479 Bronchiectasis, uncomplicated: Secondary | ICD-10-CM | POA: Diagnosis not present

## 2020-08-28 LAB — CMP (CANCER CENTER ONLY)
ALT: 15 U/L (ref 0–44)
AST: 38 U/L (ref 15–41)
Albumin: 3.9 g/dL (ref 3.5–5.0)
Alkaline Phosphatase: 77 U/L (ref 38–126)
Anion gap: 10 (ref 5–15)
BUN: 11 mg/dL (ref 8–23)
CO2: 27 mmol/L (ref 22–32)
Calcium: 10.3 mg/dL (ref 8.9–10.3)
Chloride: 104 mmol/L (ref 98–111)
Creatinine: 0.88 mg/dL (ref 0.44–1.00)
GFR, Estimated: >60 mL/min (ref >60)
Glucose, Bld: 88 mg/dL (ref 70–99)
Potassium: 4.6 mmol/L (ref 3.5–5.1)
Sodium: 141 mmol/L (ref 135–145)
Total Bilirubin: 0.7 mg/dL (ref 0.3–1.2)
Total Protein: 7.3 g/dL (ref 6.5–8.1)

## 2020-08-28 LAB — CBC WITH DIFFERENTIAL (CANCER CENTER ONLY)
Abs Immature Granulocytes: 0.02 10*3/uL (ref 0.00–0.07)
Basophils Absolute: 0.1 10*3/uL (ref 0.0–0.1)
Basophils Relative: 1 %
Eosinophils Absolute: 0.1 10*3/uL (ref 0.0–0.5)
Eosinophils Relative: 2 %
HCT: 37.7 % (ref 36.0–46.0)
Hemoglobin: 12.3 g/dL (ref 12.0–15.0)
Immature Granulocytes: 0 %
Lymphocytes Relative: 13 %
Lymphs Abs: 0.6 10*3/uL — ABNORMAL LOW (ref 0.7–4.0)
MCH: 30.1 pg (ref 26.0–34.0)
MCHC: 32.6 g/dL (ref 30.0–36.0)
MCV: 92.4 fL (ref 80.0–100.0)
Monocytes Absolute: 0.7 10*3/uL (ref 0.1–1.0)
Monocytes Relative: 16 %
Neutro Abs: 3.1 10*3/uL (ref 1.7–7.7)
Neutrophils Relative %: 68 %
Platelet Count: 158 10*3/uL (ref 150–400)
RBC: 4.08 MIL/uL (ref 3.87–5.11)
RDW: 14.3 % (ref 11.5–15.5)
WBC Count: 4.6 10*3/uL (ref 4.0–10.5)
nRBC: 0 % (ref 0.0–0.2)

## 2020-08-29 ENCOUNTER — Inpatient Hospital Stay (HOSPITAL_BASED_OUTPATIENT_CLINIC_OR_DEPARTMENT_OTHER): Payer: Medicare PPO | Admitting: Internal Medicine

## 2020-08-29 VITALS — BP 167/71 | HR 84 | Temp 97.4°F | Resp 19 | Ht 62.0 in | Wt 138.0 lb

## 2020-08-29 DIAGNOSIS — C3490 Malignant neoplasm of unspecified part of unspecified bronchus or lung: Secondary | ICD-10-CM

## 2020-08-29 DIAGNOSIS — Z85118 Personal history of other malignant neoplasm of bronchus and lung: Secondary | ICD-10-CM | POA: Diagnosis not present

## 2020-08-29 DIAGNOSIS — Z9221 Personal history of antineoplastic chemotherapy: Secondary | ICD-10-CM | POA: Diagnosis not present

## 2020-08-29 DIAGNOSIS — N289 Disorder of kidney and ureter, unspecified: Secondary | ICD-10-CM | POA: Diagnosis not present

## 2020-08-29 NOTE — Progress Notes (Signed)
[facility] Telephone:(336) [phone]   Fax:(336) [place], [place] [address] [place] [address]  PRINCIPAL DIAGNOSIS: recurrent small cell lung cancer initially diagnosed as Limited stage small cell lung cancer in June 2011, with recurrence in June 2015.   PRIOR THERAPY:  1. Status post 4 cycles of systemic chemotherapy with carboplatin and etoposide. Last dose was given 11/14/2009. This was concurrent with radiotherapy. 2. Status post prophylactic cranial irradiation completed January 28, 2010. 3. Status post wedge resection of the right upper lobe lung nodule under the care of Dr. Cyndia Bent on 09/21/2013 and the final pathology was consistent with small cell lung cancer. 4. systemic chemotherapy with carboplatin for AUC of 5 on day 1 and etoposide 120 mg/M2 on days 1, 2 and 3 with Neulasta support on day 4 every 3 weeks. Status post 4 cycles. First dose on 10/30/2013.  CURRENT THERAPY: Observation.  INTERVAL HISTORY: FARHANA FELLOWS 75 y.o. female returns to the clinic today for 86-month follow-up visit accompanied by her husband.  The patient is feeling fine today with no concerning complaints.  She has no chest pain, shortness of breath, cough or hemoptysis.  She denied having any fever or chills.  She has no nausea, vomiting, diarrhea or constipation.  She denied having any headache or visual changes.  She has no weight loss or night sweats. The patient had repeat CT scan of the chest performed recently and she is here for evaluation and discussion of her risk her results.  MEDICAL HISTORY: Past Medical History:  Diagnosis Date  . Anemia   . Arthritis   . Bradycardia    mild,may be due to beta blocker therapy  . COPD (chronic obstructive pulmonary disease) ([place])   . GERD (gastroesophageal reflux disease)    otc  . Hypertension 06/01/2019  . Local recurrence of lung cancer ([place]) dx'd 07/2013   rt thoracotomy  chemo comp 12/2013  . Lung cancer [facility]) 2010   Dr. Julien Nordmann, finished chemo, sp radiation, left upper   . Lymphocytic colitis   . Pneumonia     ALLERGIES:  is allergic to azithromycin and tussionex pennkinetic er [hydrocod polst-cpm polst er].  MEDICATIONS:  Current Outpatient Medications  Medication Sig Dispense Refill  . albuterol (VENTOLIN HFA) 108 (90 Base) MCG/ACT inhaler Inhale into the lungs.    . benzonatate (TESSALON) 200 MG capsule Take 200 mg by mouth 3 (three) times daily as needed.    . calcium citrate-vitamin D (CITRACAL+D) 315-200 MG-UNIT tablet Take 1 tablet by mouth 2 (two) times daily.    . chlorpheniramine-HYDROcodone (TUSSIONEX PENNKINETIC ER) 10-8 MG/5ML SUER Take 5 mLs by mouth every 12 (twelve) hours as needed for cough. 50 mL 0  . co-enzyme Q-10 30 MG capsule Take 30 mg by mouth daily.    . Probiotic Product (PROBIOTIC DAILY PO) Take 1 each by mouth daily.     No current facility-administered medications for this visit.    SURGICAL HISTORY:  Past Surgical History:  Procedure Laterality Date  . BRONCHOSCOPY  2011  . DILATION AND CURETTAGE OF UTERUS    . NECK SURGERY  1980's  . THORACOTOMY Right 09/21/2013   Procedure: THORACOTOMY MAJOR;  Surgeon: Gaye Pollack, MD;  Location: [facility];  Service: Thoracic;  Laterality: Right;  . UTERINE FIBROID SURGERY  2012  . WEDGE RESECTION Right 09/21/2013   Procedure: RIGHT UPPER LOBE WEDGE RESECTION;  Surgeon: Fernande Boyden  Cyndia Bent, MD;  Location: [facility];  Service: Thoracic;  Laterality: Right;    REVIEW OF SYSTEMS:  A comprehensive review of systems was negative.   PHYSICAL EXAMINATION: General appearance: alert, cooperative and no distress Head: Normocephalic, without obvious abnormality, atraumatic Neck: no adenopathy Lymph nodes: Cervical, supraclavicular, and axillary nodes normal. Resp: clear to auscultation bilaterally Back: symmetric, no curvature. ROM normal. No CVA tenderness. Cardio: regular rate and rhythm, S1, S2  normal, no murmur, click, rub or gallop GI: soft, non-tender; bowel sounds normal; no masses,  no organomegaly Extremities: extremities normal, atraumatic, no cyanosis or edema  ECOG PERFORMANCE STATUS: 1 - Symptomatic but completely ambulatory  Blood pressure (!) 167/71, pulse 84, temperature (!) 97.4 F (36.3 C), temperature source Tympanic, resp. rate 19, height 5\' 2"  (1.575 m), weight 138 lb (62.6 kg), SpO2 97 %.  LABORATORY DATA: Lab Results  Component Value Date   WBC 4.6 08/28/2020   HGB 12.3 08/28/2020   HCT 37.7 08/28/2020   MCV 92.4 08/28/2020   PLT 158 08/28/2020      Chemistry      Component Value Date/Time   NA 141 08/28/2020 1032   NA 141 11/02/2016 0919   K 4.6 08/28/2020 1032   K 4.2 11/02/2016 0919   CL 104 08/28/2020 1032   CL 103 08/03/2012 0842   CO2 27 08/28/2020 1032   CO2 29 11/02/2016 0919   BUN 11 08/28/2020 1032   BUN 13.9 11/02/2016 0919   CREATININE 0.88 08/28/2020 1032   CREATININE 1.0 11/02/2016 0919      Component Value Date/Time   CALCIUM 10.3 08/28/2020 1032   CALCIUM 10.9 (H) 11/02/2016 0919   ALKPHOS 77 08/28/2020 1032   ALKPHOS 78 11/02/2016 0919   AST 38 08/28/2020 1032   AST 19 11/02/2016 0919   ALT 15 08/28/2020 1032   ALT 12 11/02/2016 0919   BILITOT 0.7 08/28/2020 1032   BILITOT 0.47 11/02/2016 0919       RADIOGRAPHIC STUDIES: CT CHEST WO CONTRAST  Result Date: 08/28/2020 CLINICAL DATA:  Small cell lung cancer staging in a 75 year old female EXAM: CT CHEST WITHOUT CONTRAST TECHNIQUE: Multidetector CT imaging of the chest was performed following the standard protocol without IV contrast. COMPARISON:  CT of the chest dated March 01, 2020. FINDINGS: Cardiovascular: Calcified atheromatous plaque of the thoracic aorta. Normal heart size. Mediastinal structures shifted into the LEFT chest following fibrotic volume loss as on the prior study. Central pulmonary arteries with stable caliber. Limited assessment of cardiovascular  structures given lack of intravenous contrast. Mediastinum/Nodes: No axillary lymphadenopathy. Patulous esophagus is unchanged. LEFT perihilar soft tissue contiguous with consolidative changes and bronchiectasis in the LEFT chest with similar appearance. No mediastinal adenopathy. No internal mammary adenopathy no thoracic inlet lymphadenopathy. Lungs/Pleura: Marked pulmonary emphysema. Scattered nodular densities in the RIGHT upper lobe with stable appearance for the most part but with near complete resolution of the dominant area of nodularity discussed on the previous study. No new suspicious pulmonary nodule. Wedge resection changes in the RIGHT upper lobe. (Image 101/7) stable 4 mm RIGHT lower lobe pulmonary nodule Consolidative changes in the LEFT chest with associated pleural thickening and bronchiectasis extending away from the LEFT hilum with similar appearance. Small LEFT-sided pleural effusion in the dependent LEFT chest slightly increased since the prior study remains minimal in terms of its volume. Upper Abdomen: Incidentally imaged portions of liver, gallbladder, pancreas, spleen and adrenal glands are unremarkable. Small cyst arises from upper pole LEFT kidney grossly stable. Presumed  hemorrhagic cyst arising from the upper pole the RIGHT kidney is also unchanged Musculoskeletal: No acute musculoskeletal process. No destructive bone findings. IMPRESSION: 1. Post treatment related changes in the LEFT chest without significant interval change or sign of disease recurrence. 2. Scattered nodular densities in the RIGHT upper lobe with near complete resolution of the dominant area of nodularity discussed on the previous study. Findings may represent a combination of post treatment change and/or inflammation. Attention on follow-up. 3. Stable appearance of small pulmonary nodule in the RIGHT lower lobe as described. 4. Minimal increase in size of small LEFT-sided effusion, attention on follow-up. 5.  Emphysema and aortic atherosclerosis. Aortic Atherosclerosis (ICD10-I70.0) and Emphysema (ICD10-J43.9). Electronically Signed   By: Zetta Bills M.D.   On: 08/28/2020 16:46   ASSESSMENT AND PLAN:  This is a very pleasant 75 years old white female with recurrent small cell lung cancer that was initially diagnosed in June 2011. She underwent systemic chemotherapy was carboplatin and etoposide concurrent with radiation and followed by prophylactic cranial irradiation. The patient was on observation since November 2011 that she had recurrence with right upper lobe nodule in June 2015 status post wedge resection that was consistent with recurrent small cell lung cancer. She was then treated with systemic chemotherapy again with carboplatin and etoposide for 4 cycles. The patient has been on observation since that time with no concerning complaints. She had repeat CT scan of the chest performed recently.  I personally and independently reviewed the scans and discussed the results with the patient today. Her scan showed no concerning findings for disease recurrence or metastasis. I recommended for her to continue on observation with repeat CT scan of the chest in 6 months. For the suspicious renal mass she will continue her routine follow-up visit and evaluation by urology. The patient was advised to call immediately if she has any concerning symptoms in the interval. All questions were answered. The patient knows to call the clinic with any problems, questions or concerns. We can certainly see the patient much sooner if necessary.  Disclaimer: This note was dictated with voice recognition software. Similar sounding words can inadvertently be transcribed and may not be corrected upon review.

## 2020-09-26 DIAGNOSIS — L304 Erythema intertrigo: Secondary | ICD-10-CM | POA: Diagnosis not present

## 2020-09-26 DIAGNOSIS — L578 Other skin changes due to chronic exposure to nonionizing radiation: Secondary | ICD-10-CM | POA: Diagnosis not present

## 2020-09-26 DIAGNOSIS — L814 Other melanin hyperpigmentation: Secondary | ICD-10-CM | POA: Diagnosis not present

## 2020-09-26 DIAGNOSIS — Z411 Encounter for cosmetic surgery: Secondary | ICD-10-CM | POA: Diagnosis not present

## 2020-09-26 DIAGNOSIS — Z85828 Personal history of other malignant neoplasm of skin: Secondary | ICD-10-CM | POA: Diagnosis not present

## 2020-09-26 DIAGNOSIS — D225 Melanocytic nevi of trunk: Secondary | ICD-10-CM | POA: Diagnosis not present

## 2020-09-26 DIAGNOSIS — L821 Other seborrheic keratosis: Secondary | ICD-10-CM | POA: Diagnosis not present

## 2020-11-04 ENCOUNTER — Encounter (HOSPITAL_COMMUNITY): Payer: Self-pay | Admitting: *Deleted

## 2020-11-04 ENCOUNTER — Emergency Department (HOSPITAL_COMMUNITY)
Admission: EM | Admit: 2020-11-04 | Discharge: 2020-11-04 | Disposition: A | Discharge status: Home / Self Care | Payer: Medicare PPO | Attending: Emergency Medicine | Admitting: Emergency Medicine

## 2020-11-04 ENCOUNTER — Other Ambulatory Visit: Payer: Self-pay

## 2020-11-04 ENCOUNTER — Emergency Department (HOSPITAL_COMMUNITY): Payer: Medicare PPO

## 2020-11-04 ENCOUNTER — Telehealth: Payer: Self-pay | Admitting: Medical Oncology

## 2020-11-04 DIAGNOSIS — I10 Essential (primary) hypertension: Secondary | ICD-10-CM | POA: Insufficient documentation

## 2020-11-04 DIAGNOSIS — Z87891 Personal history of nicotine dependence: Secondary | ICD-10-CM | POA: Insufficient documentation

## 2020-11-04 DIAGNOSIS — R059 Cough, unspecified: Secondary | ICD-10-CM | POA: Insufficient documentation

## 2020-11-04 DIAGNOSIS — Z85118 Personal history of other malignant neoplasm of bronchus and lung: Secondary | ICD-10-CM | POA: Insufficient documentation

## 2020-11-04 DIAGNOSIS — J449 Chronic obstructive pulmonary disease, unspecified: Secondary | ICD-10-CM | POA: Insufficient documentation

## 2020-11-04 DIAGNOSIS — Z79899 Other long term (current) drug therapy: Secondary | ICD-10-CM | POA: Insufficient documentation

## 2020-11-04 DIAGNOSIS — R062 Wheezing: Secondary | ICD-10-CM | POA: Insufficient documentation

## 2020-11-04 DIAGNOSIS — R0789 Other chest pain: Secondary | ICD-10-CM | POA: Insufficient documentation

## 2020-11-04 DIAGNOSIS — R079 Chest pain, unspecified: Secondary | ICD-10-CM | POA: Diagnosis not present

## 2020-11-04 DIAGNOSIS — R911 Solitary pulmonary nodule: Secondary | ICD-10-CM | POA: Diagnosis not present

## 2020-11-04 DIAGNOSIS — J90 Pleural effusion, not elsewhere classified: Secondary | ICD-10-CM | POA: Diagnosis not present

## 2020-11-04 LAB — BASIC METABOLIC PANEL
Anion gap: 6 (ref 5–15)
BUN: 13 mg/dL (ref 8–23)
CO2: 29 mmol/L (ref 22–32)
Calcium: 10.1 mg/dL (ref 8.9–10.3)
Chloride: 99 mmol/L (ref 98–111)
Creatinine, Ser: 0.75 mg/dL (ref 0.44–1.00)
GFR, Estimated: >60 mL/min (ref >60)
Glucose, Bld: 115 mg/dL — ABNORMAL HIGH (ref 70–99)
Potassium: 4 mmol/L (ref 3.5–5.1)
Sodium: 134 mmol/L — ABNORMAL LOW (ref 135–145)

## 2020-11-04 LAB — CBC
HCT: 40.9 % (ref 36.0–46.0)
Hemoglobin: 13.3 g/dL (ref 12.0–15.0)
MCH: 30.6 pg (ref 26.0–34.0)
MCHC: 32.5 g/dL (ref 30.0–36.0)
MCV: 94 fL (ref 80.0–100.0)
Platelets: 160 10*3/uL (ref 150–400)
RBC: 4.35 MIL/uL (ref 3.87–5.11)
RDW: 13.9 % (ref 11.5–15.5)
WBC: 10.5 10*3/uL (ref 4.0–10.5)
nRBC: 0 % (ref 0.0–0.2)

## 2020-11-04 LAB — TROPONIN I (HIGH SENSITIVITY): Troponin I (High Sensitivity): 3 ng/L (ref <18)

## 2020-11-04 MED ORDER — IOHEXOL 350 MG/ML SOLN
75.0000 mL | Freq: Once | INTRAVENOUS | Status: AC | PRN
  Administered 2020-11-04: 75 mL via INTRAVENOUS

## 2020-11-04 NOTE — Telephone Encounter (Signed)
New pain in back that woke her up last night. It moved around to front of chest. She also reports a new cough and sob  .   She is audibly wheezing and sounds sob.  Action- I instructed pt to go to ED now for evaluation of symptoms. She said she will get her husband to take her.  Julien Nordmann has her on observation and next oncology appt is next May

## 2020-11-04 NOTE — ED Provider Notes (Signed)
[facility] EMERGENCY DEPARTMENT Provider Note   CSN: [id number] Arrival date & time: 11/04/20  1223     History Chief Complaint  Patient presents with   Cough    Claudia [name] is a 75 y.o. female.  75 year old female with history of small cell lung cancer in remission post chemo and wedge resection, COPD, hypertension presents with complaint of chest discomfort.  Patient states that around midnight last night she woke up with what she would describe as a "stitch" in her left scapular area that moved around to the left side of her chest.  States that she did not feel if she could take a deep breath or move at that time and just held pressure on her chest for the remainder of the night.  Pain eased around 7 AM which point she was able to get out of bed however has continued to have a slight discomfort which is constant throughout the rest of the day.  Denies associated shortness of breath at this time, nausea, vomiting, diaphoresis.  States that she is had a dry cough for the past 3 days with a negative COVID test at home.  Denies any exertional symptoms.  Patient called her oncologist office and was advised to come to the emergency room for evaluation.      Past Medical History:  Diagnosis Date   Anemia    Arthritis    Bradycardia    mild,may be due to beta blocker therapy   COPD (chronic obstructive pulmonary disease) (HCC)    GERD (gastroesophageal reflux disease)    otc   Hypertension 06/01/2019   Local recurrence of lung cancer ([place]) dx'd 07/2013   rt thoracotomy chemo comp 12/2013   Lung cancer ([place]) 2010   Dr. Julien Nordmann, finished chemo, sp radiation, left upper    Lymphocytic colitis    Pneumonia     Patient Active Problem List   Diagnosis Date Noted   Hypertension 06/01/2019   Right renal mass 06/01/2019   Acute bronchitis 04/30/2015   Other pancytopenia ([place]) 01/22/2014   S/P thoracotomy 09/24/2013   Cough 04/14/2012   Small cell lung cancer ([place]) 04/10/2011   COPD  (chronic obstructive pulmonary disease) ([place]) 04/09/2011   Anemia 04/09/2011    Past Surgical History:  Procedure Laterality Date   BRONCHOSCOPY  2011   DILATION AND CURETTAGE OF UTERUS     NECK SURGERY  1980's   THORACOTOMY Right 09/21/2013   Procedure: THORACOTOMY MAJOR;  Surgeon: Gaye Pollack, MD;  Location: [facility];  Service: Thoracic;  Laterality: Right;   UTERINE FIBROID SURGERY  2012   WEDGE RESECTION Right 09/21/2013   Procedure: RIGHT UPPER LOBE WEDGE RESECTION;  Surgeon: Gaye Pollack, MD;  Location: [facility] OR;  Service: Thoracic;  Laterality: Right;     OB History   No obstetric history on file.     Family History  Problem Relation Age of Onset   Brain cancer Mother        brain cancer   Emphysema Father    Diabetes Son    Heart disease Paternal Grandfather    Breast cancer Maternal Aunt     Social History   Tobacco Use   Smoking status: Former    Packs/day: 1.00    Years: 35.00    Pack years: 35.00    Types: Cigarettes    Quit date: 03/31/2007    Years since quitting: 13.6   Smokeless tobacco: Never  Substance Use Topics   Alcohol use:  Yes    Comment: occassional   Drug use: No    Home Medications Prior to Admission medications   Medication Sig Start Date End Date Taking? Authorizing Provider  albuterol (VENTOLIN HFA) 108 (90 Base) MCG/ACT inhaler Inhale into the lungs. 09/11/19   [provider]  benzonatate (TESSALON) 200 MG capsule Take 200 mg by mouth 3 (three) times daily as needed. 09/19/19   [provider]  calcium citrate-vitamin D (CITRACAL+D) 315-200 MG-UNIT tablet Take 1 tablet by mouth 2 (two) times daily.    [provider]  chlorpheniramine-HYDROcodone (TUSSIONEX PENNKINETIC ER) 10-8 MG/5ML SUER Take 5 mLs by mouth every 12 (twelve) hours as needed for cough. 09/20/19   Deno Etienne, DO  co-enzyme Q-10 30 MG capsule Take 30 mg by mouth daily.    [provider]  Probiotic Product (PROBIOTIC DAILY PO) Take 1  each by mouth daily.    [provider]    Allergies    Azithromycin and Tussionex pennkinetic er [hydrocod polst-cpm polst er]  Review of Systems   Review of Systems  Constitutional:  Negative for chills, diaphoresis and fever.  HENT:  Negative for congestion.   Respiratory:  Positive for cough. Negative for shortness of breath.   Cardiovascular:  Positive for chest pain. Negative for palpitations and leg swelling.  Gastrointestinal:  Negative for nausea and vomiting.  Musculoskeletal:  Negative for arthralgias, back pain and myalgias.  Skin:  Negative for rash and wound.  Allergic/Immunologic: Negative for immunocompromised state.  Neurological:  Negative for weakness and numbness.  Hematological:  Negative for adenopathy.  Psychiatric/Behavioral:  Negative for confusion.   All other systems reviewed and are negative.  Physical Exam Updated Vital Signs BP (!) 156/127   Pulse (!) 107   Temp 99 F (37.2 C) (Oral)   Resp (!) 24   Ht 5\' 2"  (1.575 m)   Wt 62.6 kg   SpO2 94%   BMI 25.24 kg/m   Physical Exam Vitals and nursing note reviewed.  Constitutional:      General: She is not in acute distress.    Appearance: She is well-developed. She is not diaphoretic.  HENT:     Head: Normocephalic and atraumatic.     Nose: Nose normal.     Mouth/Throat:     Mouth: Mucous membranes are moist.  Eyes:     Conjunctiva/sclera: Conjunctivae normal.  Cardiovascular:     Rate and Rhythm: Normal rate and regular rhythm.     Pulses: Normal pulses.     Heart sounds: Normal heart sounds.  Pulmonary:     Effort: Pulmonary effort is normal.     Breath sounds: Wheezing present.  Chest:     Chest wall: No tenderness.  Abdominal:     Palpations: Abdomen is soft.  Musculoskeletal:     Cervical back: Neck supple. No tenderness or bony tenderness.     Thoracic back: No tenderness or bony tenderness.     Lumbar back: No tenderness or bony tenderness.     Right lower leg: No  edema.     Left lower leg: No edema.  Skin:    General: Skin is warm and dry.     Findings: No erythema or rash.  Neurological:     Mental Status: She is alert and oriented to person, place, and time.  Psychiatric:        Behavior: Behavior normal.    ED Results / Procedures / Treatments   Labs (all labs ordered are  listed, but only abnormal results are displayed) Labs Reviewed  BASIC METABOLIC PANEL - Abnormal; Notable for the following components:      Result Value   Sodium 134 (*)    Glucose, Bld 115 (*)    All other components within normal limits  CBC  TROPONIN I (HIGH SENSITIVITY)    EKG EKG Interpretation  Date/Time:  Monday November 04 2020 17:58:45 EDT Ventricular Rate:  96 PR Interval:  158 QRS Duration: 89 QT Interval:  337 QTC Calculation: 426 R Axis:   -33 Text Interpretation: Sinus rhythm Right atrial enlargement Left axis deviation Nonspecific T abnormalities, lateral leads No significant change since last tracing Confirmed by Dorie Rank [phone]) on 11/04/2020 6:08:28 PM  Radiology DG Chest 2 View  Result Date: 11/04/2020 CLINICAL DATA:  cough EXAM: CHEST - 2 VIEW COMPARISON:  September 20, 2019. FINDINGS: Similar left perihilar opacity with surrounding retraction and linear opacities, compatible with post treatment change. No definite new consolidation. Pulmonary nodules better characterized on prior CT. Similar suspected small left pleural effusion. No visible pneumothorax. Similar cardiomediastinal silhouette. IMPRESSION: 1. Similar post treatment change without definite new acute abnormality. 2. Similar suspected small left pleural effusion. 3. Pulmonary nodules better characterized on prior CT. Electronically Signed   By: Margaretha Sheffield MD   On: 11/04/2020 13:55   CT Angio Chest PE W/Cm &/Or Wo Cm  Result Date: 11/04/2020 CLINICAL DATA:  PE suspected, high prob Cough.  Back pain.  History of lung cancer. EXAM: CT ANGIOGRAPHY CHEST WITH CONTRAST TECHNIQUE:  Multidetector CT imaging of the chest was performed using the standard protocol during bolus administration of intravenous contrast. Multiplanar CT image reconstructions and MIPs were obtained to evaluate the vascular anatomy. CONTRAST:  20mL OMNIPAQUE IOHEXOL 350 MG/ML SOLN COMPARISON:  Radiograph earlier today. Most recent chest CT 08/28/2020 FINDINGS: Cardiovascular: There are no filling defects within the pulmonary arteries to suggest pulmonary embolus. The left pulmonary arteries are attenuated no no intraluminal filling defects. Thoracic aortic atherosclerosis. No aneurysm or dissection. Normal heart size. Trace pericardial fluid anteriorly without significant pericardial effusion. Mediastinum/Nodes: Chronic leftward mediastinal shift patulous esophagus with small volume intraluminal fluid distally. No discrete mediastinal or hilar adenopathy. Soft tissue density at the left hilum contiguous with consolidative changes and bronchiectasis, unchanged. No suspicious thyroid nodule. Lungs/Pleura: Prominent emphysema with central bronchial thickening. Chain sutures again noted in the right upper lobe. Stable appearance of left lung volume loss with perihilar consolidation and bronchiectasis. Small left pleural effusion that is partially loculated, slightly increased in size from prior exam. 4 mm right lower lobe pulmonary nodule, series 7, image 96, unchanged. Scattered areas of right upper lobe scarring that appears similar. No new pulmonary nodule. No acute airspace consolidation. No right pleural effusion. There is no pulmonary edema. Upper Abdomen: No acute findings. Cyst in the upper left kidney is again seen. Small hemorrhagic cyst in the upper right kidney is again seen. Musculoskeletal: There are no acute or suspicious osseous abnormalities. No focal bone lesion. Review of the MIP images confirms the above findings. IMPRESSION: 1. No pulmonary embolus. 2. There is slight increase in left pleural effusion  from prior exam. Otherwise stable appearance of post treatment change in the left hemithorax with lung volume loss with perihilar consolidation and bronchiectasis. 3. Stable 4 mm right lower lobe pulmonary nodule. 4. Emphysema and bronchial thickening.  No evidence of pneumonia. 5. Patulous esophagus with air-fluid level, query reflux. Aortic Atherosclerosis (ICD10-I70.0) and Emphysema (ICD10-J43.9). Electronically Signed   By: Threasa Beards  Sanford M.D.   On: 11/04/2020 19:49    Procedures Procedures   Medications Ordered in ED Medications  iohexol (OMNIPAQUE) 350 MG/ML injection 75 mL (75 mLs Intravenous Contrast Given 11/04/20 1926)    ED Course  I have reviewed the triage vital signs and the nursing notes.  Pertinent labs & imaging results that were available during my care of the patient were reviewed by me and considered in my medical decision making (see chart for details).  Clinical Course as of 11/04/20 2029  Mon Nov 05, 3467  [id number] 75 year old female with complaint of left-sided chest pain as above.  On exam, there is no chest wall tenderness or reproducible musculoskeletal tenderness.  She has very mild wheezing which she states does not bother her, is baseline, is worse if she lies down at night at baseline. Due to her history of cancer, will obtain labs and evaluate for PE as well as troponin for cardiac source of pain.  [LM]  2029 CT chest negative for PE or other acute pathology.  Discussed results with patient.  Plan is for her to follow-up with her providers.  Return to ED for worsening or concerning symptoms. [LM]    Clinical Course User Index [LM] Roque Lias   MDM Rules/Calculators/A&P                           Final Clinical Impression(s) / ED Diagnoses Final diagnoses:  Chest pain, unspecified type    Rx / DC Orders ED Discharge Orders     None        [place], [place] 11/04/20 2029    Dorie Rank, MD 11/05/20 [phone]

## 2020-11-04 NOTE — ED Triage Notes (Signed)
Pt c/o cough and back pain that radiated to her chest

## 2020-11-04 NOTE — Discharge Instructions (Signed)
Your work-up today is unrevealing.  Recommend follow-up with your primary care provider.

## 2020-11-26 DIAGNOSIS — Z1231 Encounter for screening mammogram for malignant neoplasm of breast: Secondary | ICD-10-CM | POA: Diagnosis not present

## 2021-03-31 DIAGNOSIS — M791 Myalgia, unspecified site: Secondary | ICD-10-CM | POA: Diagnosis not present

## 2021-03-31 DIAGNOSIS — J00 Acute nasopharyngitis [common cold]: Secondary | ICD-10-CM | POA: Diagnosis not present

## 2021-04-02 ENCOUNTER — Encounter (HOSPITAL_COMMUNITY): Payer: Self-pay

## 2021-04-02 ENCOUNTER — Ambulatory Visit (HOSPITAL_COMMUNITY)
Admission: RE | Admit: 2021-04-02 | Discharge: 2021-04-02 | Disposition: A | Discharge status: Home / Self Care | Payer: Medicare PPO | Source: Ambulatory Visit | Attending: Internal Medicine | Admitting: Internal Medicine

## 2021-04-02 ENCOUNTER — Other Ambulatory Visit: Payer: Self-pay

## 2021-04-02 ENCOUNTER — Inpatient Hospital Stay: Payer: Medicare PPO | Attending: Internal Medicine

## 2021-04-02 DIAGNOSIS — N2889 Other specified disorders of kidney and ureter: Secondary | ICD-10-CM | POA: Insufficient documentation

## 2021-04-02 DIAGNOSIS — C3490 Malignant neoplasm of unspecified part of unspecified bronchus or lung: Secondary | ICD-10-CM

## 2021-04-02 DIAGNOSIS — K2100 Gastro-esophageal reflux disease with esophagitis, without bleeding: Secondary | ICD-10-CM | POA: Insufficient documentation

## 2021-04-02 DIAGNOSIS — C3411 Malignant neoplasm of upper lobe, right bronchus or lung: Secondary | ICD-10-CM | POA: Insufficient documentation

## 2021-04-02 DIAGNOSIS — R911 Solitary pulmonary nodule: Secondary | ICD-10-CM | POA: Diagnosis not present

## 2021-04-02 DIAGNOSIS — I700 Atherosclerosis of aorta: Secondary | ICD-10-CM | POA: Diagnosis not present

## 2021-04-02 DIAGNOSIS — R066 Hiccough: Secondary | ICD-10-CM | POA: Insufficient documentation

## 2021-04-02 DIAGNOSIS — Z923 Personal history of irradiation: Secondary | ICD-10-CM | POA: Insufficient documentation

## 2021-04-02 LAB — CBC WITH DIFFERENTIAL (CANCER CENTER ONLY)
Abs Immature Granulocytes: 0.02 10*3/uL (ref 0.00–0.07)
Basophils Absolute: 0 10*3/uL (ref 0.0–0.1)
Basophils Relative: 1 %
Eosinophils Absolute: 0 10*3/uL (ref 0.0–0.5)
Eosinophils Relative: 1 %
HCT: 37.5 % (ref 36.0–46.0)
Hemoglobin: 12.7 g/dL (ref 12.0–15.0)
Immature Granulocytes: 0 %
Lymphocytes Relative: 11 %
Lymphs Abs: 0.5 10*3/uL — ABNORMAL LOW (ref 0.7–4.0)
MCH: 30.6 pg (ref 26.0–34.0)
MCHC: 33.9 g/dL (ref 30.0–36.0)
MCV: 90.4 fL (ref 80.0–100.0)
Monocytes Absolute: 0.6 10*3/uL (ref 0.1–1.0)
Monocytes Relative: 14 %
Neutro Abs: 3.4 10*3/uL (ref 1.7–7.7)
Neutrophils Relative %: 73 %
Platelet Count: 135 10*3/uL — ABNORMAL LOW (ref 150–400)
RBC: 4.15 MIL/uL (ref 3.87–5.11)
RDW: 14.2 % (ref 11.5–15.5)
WBC Count: 4.6 10*3/uL (ref 4.0–10.5)
nRBC: 0 % (ref 0.0–0.2)

## 2021-04-02 LAB — CMP (CANCER CENTER ONLY)
ALT: 9 U/L (ref 0–44)
AST: 17 U/L (ref 15–41)
Albumin: 4 g/dL (ref 3.5–5.0)
Alkaline Phosphatase: 77 U/L (ref 38–126)
Anion gap: 4 — ABNORMAL LOW (ref 5–15)
BUN: 12 mg/dL (ref 8–23)
CO2: 32 mmol/L (ref 22–32)
Calcium: 10.1 mg/dL (ref 8.9–10.3)
Chloride: 102 mmol/L (ref 98–111)
Creatinine: 0.83 mg/dL (ref 0.44–1.00)
GFR, Estimated: >60 mL/min (ref >60)
Glucose, Bld: 92 mg/dL (ref 70–99)
Potassium: 3.9 mmol/L (ref 3.5–5.1)
Sodium: 138 mmol/L (ref 135–145)
Total Bilirubin: 0.6 mg/dL (ref 0.3–1.2)
Total Protein: 7.2 g/dL (ref 6.5–8.1)

## 2021-04-02 MED ORDER — IOHEXOL 350 MG/ML SOLN
60.0000 mL | Freq: Once | INTRAVENOUS | Status: AC | PRN
  Administered 2021-04-02: 60 mL via INTRAVENOUS

## 2021-04-02 MED ORDER — SODIUM CHLORIDE (PF) 0.9 % IJ SOLN
INTRAMUSCULAR | Status: AC
  Filled 2021-04-02: qty 50

## 2021-04-08 ENCOUNTER — Other Ambulatory Visit: Payer: Medicare PPO

## 2021-04-08 ENCOUNTER — Encounter: Payer: Self-pay | Admitting: Internal Medicine

## 2021-04-08 ENCOUNTER — Inpatient Hospital Stay: Payer: Medicare PPO | Admitting: Internal Medicine

## 2021-04-08 ENCOUNTER — Ambulatory Visit: Payer: Medicare PPO | Admitting: Internal Medicine

## 2021-04-08 ENCOUNTER — Other Ambulatory Visit: Payer: Self-pay

## 2021-04-08 VITALS — BP 175/71 | HR 93 | Temp 97.5°F | Resp 18 | Wt 130.3 lb

## 2021-04-08 DIAGNOSIS — C3490 Malignant neoplasm of unspecified part of unspecified bronchus or lung: Secondary | ICD-10-CM

## 2021-04-08 DIAGNOSIS — R066 Hiccough: Secondary | ICD-10-CM | POA: Diagnosis not present

## 2021-04-08 DIAGNOSIS — N2889 Other specified disorders of kidney and ureter: Secondary | ICD-10-CM | POA: Diagnosis not present

## 2021-04-08 DIAGNOSIS — Z923 Personal history of irradiation: Secondary | ICD-10-CM | POA: Diagnosis not present

## 2021-04-08 DIAGNOSIS — K2100 Gastro-esophageal reflux disease with esophagitis, without bleeding: Secondary | ICD-10-CM | POA: Diagnosis not present

## 2021-04-08 DIAGNOSIS — C3411 Malignant neoplasm of upper lobe, right bronchus or lung: Secondary | ICD-10-CM | POA: Diagnosis not present

## 2021-04-08 NOTE — Progress Notes (Signed)
[facility] [phone]   Fax:([phone]) [place], [place] [address] [place] [address]  PRINCIPAL DIAGNOSIS: recurrent small cell lung cancer initially diagnosed as Limited stage small cell lung cancer in June 2011, with recurrence in June 2015.   PRIOR THERAPY:  Status post 4 cycles of systemic chemotherapy with carboplatin and etoposide. Last dose was given 11/14/2009. This was concurrent with radiotherapy. Status post prophylactic cranial irradiation completed January 28, 2010. Status post wedge resection of the right upper lobe lung nodule under the care of Dr. Cyndia Bent on 09/21/2013 and the final pathology was consistent with small cell lung cancer. systemic chemotherapy with carboplatin for AUC of 5 on day 1 and etoposide 120 mg/M2 on days 1, 2 and 3 with Neulasta support on day 4 every 3 weeks. Status post 4 cycles. First dose on 10/30/2013.  CURRENT THERAPY: Observation.  INTERVAL HISTORY: Claudia [name] 76 y.o. female returns to the clinic today for 7-month follow-up visit accompanied by her husband.  The patient is feeling fine today with no concerning complaints except for hiccups.  She denied having any current chest pain, shortness of breath, cough or hemoptysis.  She has no nausea, vomiting, diarrhea or constipation.  She has no headache or visual changes.  She denied having any significant weight loss or night sweats.  She had repeat CT scan of the chest performed recently and she is here for evaluation and discussion of her scan results.  MEDICAL HISTORY: Past Medical History:  Diagnosis Date   Anemia    Arthritis    Bradycardia    mild,may be due to beta blocker therapy   COPD (chronic obstructive pulmonary disease) (HCC)    GERD (gastroesophageal reflux disease)    otc   Hypertension 06/01/2019   Local recurrence of lung cancer ([place]) dx'd 07/2013   rt thoracotomy chemo comp 12/2013    Lung cancer ([facility]) 2010   Dr. Julien Nordmann, finished chemo, sp radiation, left upper    Lymphocytic colitis    Pneumonia     ALLERGIES:  is allergic to azithromycin and tussionex pennkinetic er [hydrocod polst-cpm polst er].  MEDICATIONS:  Current Outpatient Medications  Medication Sig Dispense Refill   calcium citrate-vitamin D (CITRACAL+D) 315-200 MG-UNIT tablet Take 1 tablet by mouth 2 (two) times daily.     co-enzyme Q-10 30 MG capsule Take 30 mg by mouth daily.     Probiotic Product (PROBIOTIC DAILY PO) Take 1 each by mouth daily.     albuterol (VENTOLIN HFA) 108 (90 Base) MCG/ACT inhaler Inhale into the lungs. (Patient not taking: Reported on 04/08/2021)     No current facility-administered medications for this visit.    SURGICAL HISTORY:  Past Surgical History:  Procedure Laterality Date   BRONCHOSCOPY  2011   DILATION AND CURETTAGE OF UTERUS     NECK SURGERY  1980's   THORACOTOMY Right 09/21/2013   Procedure: THORACOTOMY MAJOR;  Surgeon: Gaye Pollack, MD;  Location: [facility];  Service: Thoracic;  Laterality: Right;   UTERINE FIBROID SURGERY  2012   WEDGE RESECTION Right 09/21/2013   Procedure: RIGHT UPPER LOBE WEDGE RESECTION;  Surgeon: Gaye Pollack, MD;  Location: [facility] OR;  Service: Thoracic;  Laterality: Right;    REVIEW OF SYSTEMS:  A comprehensive review of systems was negative except for: Gastrointestinal: positive for hiccups    PHYSICAL EXAMINATION: General appearance: alert, cooperative, and no distress Head: Normocephalic,  without obvious abnormality, atraumatic Neck: no adenopathy Lymph nodes: Cervical, supraclavicular, and axillary nodes normal. Resp: clear to auscultation bilaterally Back: symmetric, no curvature. ROM normal. No CVA tenderness. Cardio: regular rate and rhythm, S1, S2 normal, no murmur, click, rub or gallop GI: soft, non-tender; bowel sounds normal; no masses,  no organomegaly Extremities: extremities normal, atraumatic, no cyanosis or  edema  ECOG PERFORMANCE STATUS: 1 - Symptomatic but completely ambulatory  Blood pressure (!) 175/71, pulse 93, temperature (!) 97.5 F (36.4 C), temperature source Oral, resp. rate 18, weight 130 lb 4.8 oz (59.1 kg), SpO2 96 %.  LABORATORY DATA: Lab Results  Component Value Date   WBC 4.6 04/02/2021   HGB 12.7 04/02/2021   HCT 37.5 04/02/2021   MCV 90.4 04/02/2021   PLT 135 (L) 04/02/2021      Chemistry      Component Value Date/Time   NA 138 04/02/2021 1045   NA 141 11/02/2016 0919   K 3.9 04/02/2021 1045   K 4.2 11/02/2016 0919   CL 102 04/02/2021 1045   CL 103 08/03/2012 0842   CO2 32 04/02/2021 1045   CO2 29 11/02/2016 0919   BUN 12 04/02/2021 1045   BUN 13.9 11/02/2016 0919   CREATININE 0.83 04/02/2021 1045   CREATININE 1.0 11/02/2016 0919      Component Value Date/Time   CALCIUM 10.1 04/02/2021 1045   CALCIUM 10.9 (H) 11/02/2016 0919   ALKPHOS 77 04/02/2021 1045   ALKPHOS 78 11/02/2016 0919   AST 17 04/02/2021 1045   AST 19 11/02/2016 0919   ALT 9 04/02/2021 1045   ALT 12 11/02/2016 0919   BILITOT 0.6 04/02/2021 1045   BILITOT 0.47 11/02/2016 0919       RADIOGRAPHIC STUDIES: CT Chest W Contrast  Result Date: 04/02/2021 CLINICAL DATA:  A 76 year old female presents for evaluation of small cell lung cancer. EXAM: CT CHEST WITH CONTRAST TECHNIQUE: Multidetector CT imaging of the chest was performed during intravenous contrast administration. CONTRAST:  24mL OMNIPAQUE IOHEXOL 350 MG/ML SOLN COMPARISON:  04 November 2020 and August 28, 2020. FINDINGS: Cardiovascular: Unchanged appearance of the heart great vessels with signs of calcified and noncalcified atheromatous plaque of the aorta and branch vessels in the chest. Normal heart size with small amount of fluid in superior pericardial recess without nodularity. Central pulmonary vasculature is unremarkable on venous phase. Mediastinum/Nodes: Patulous esophagus with debris in fluid in the lumen similar to previous  imaging. No adenopathy in the chest. Lungs/Pleura: Volume loss in the LEFT chest emanating from the LEFT hilum associated with some central bronchiectatic changes. Lingular volume loss along the LEFT heart border with similar appearance. Small amount of pleural fluid in the LEFT chest is slightly diminished when compared to previous imaging no overt pleural nodularity. Small RIGHT lower lobe pulmonary nodule (image 84/5) 5 mm this is stable compared to previous imaging from June. Distortion of RIGHT upper lobe along the RIGHT heart border with signs of wedge resection with similar appearance. Stable tree-in-bud nodularity in the RIGHT upper lobe. No new suspicious pulmonary nodule. Upper Abdomen: Incidental imaging of upper abdominal contents without acute process stable hemorrhagic cyst of the upper pole the RIGHT kidney. Small cysts elsewhere in the partially visualized upper pole on the LEFT and RIGHT without change. Imaged portions of pancreas, spleen, adrenal glands and gastrointestinal tract are grossly unremarkable. Musculoskeletal: No acute musculoskeletal process. No destructive bone finding. IMPRESSION: 1. Post treatment changes in the LEFT chest without new or progressive findings and with  slight interval decrease in the pleural effusion which layers dependently. 2. Stable 5 mm RIGHT lower lobe pulmonary nodule and tree in bud nodularity in the RIGHT upper lobe, attention on follow-up. 3. Patulous esophagus with debris in fluid in the lumen similar to previous imaging. Correlate with any signs of esophagitis or dysmotility. 4. Aortic atherosclerosis. Aortic Atherosclerosis (ICD10-I70.0). Electronically Signed   By: Zetta Bills M.D.   On: 04/02/2021 17:34    ASSESSMENT AND PLAN:  This is a very pleasant 76 years old white female with recurrent small cell lung cancer that was initially diagnosed in June 2011. She underwent systemic chemotherapy was carboplatin and etoposide concurrent with radiation  and followed by prophylactic cranial irradiation. The patient was on observation since November 2011 that she had recurrence with right upper lobe nodule in June 2015 status post wedge resection that was consistent with recurrent small cell lung cancer. She was then treated with systemic chemotherapy again with carboplatin and etoposide for 4 cycles. The patient is currently on observation and she is feeling fine with no concerning complaints except for intermittent hiccups probably secondary to the reported esophagitis and this mobility of the esophagus. Her scan showed no concerning findings for disease recurrence or metastasis. I recommended for her to continue on observation with repeat CT scan of the chest in 6 months. For the esophagitis and dysmotility, I recommended for the patient to see her gastroenterologist for evaluation and management.  She may need to start treatment with Reglan. For the suspicious renal mass she will continue her routine follow-up visit and evaluation by urology. The patient was advised to call immediately if she has any other concerning symptoms in the interval. All questions were answered. The patient knows to call the clinic with any problems, questions or concerns. We can certainly see the patient much sooner if necessary.  Disclaimer: This note was dictated with voice recognition software. Similar sounding words can inadvertently be transcribed and may not be corrected upon review.

## 2021-05-13 DIAGNOSIS — K52832 Lymphocytic colitis: Secondary | ICD-10-CM | POA: Diagnosis not present

## 2021-05-13 DIAGNOSIS — R066 Hiccough: Secondary | ICD-10-CM | POA: Diagnosis not present

## 2021-05-13 DIAGNOSIS — K219 Gastro-esophageal reflux disease without esophagitis: Secondary | ICD-10-CM | POA: Diagnosis not present

## 2021-06-03 DIAGNOSIS — R350 Frequency of micturition: Secondary | ICD-10-CM | POA: Diagnosis not present

## 2021-06-10 DIAGNOSIS — K52832 Lymphocytic colitis: Secondary | ICD-10-CM | POA: Diagnosis not present

## 2021-06-10 DIAGNOSIS — R634 Abnormal weight loss: Secondary | ICD-10-CM | POA: Diagnosis not present

## 2021-06-10 DIAGNOSIS — K219 Gastro-esophageal reflux disease without esophagitis: Secondary | ICD-10-CM | POA: Diagnosis not present

## 2021-06-10 DIAGNOSIS — R066 Hiccough: Secondary | ICD-10-CM | POA: Diagnosis not present

## 2021-06-23 DIAGNOSIS — Z299 Encounter for prophylactic measures, unspecified: Secondary | ICD-10-CM | POA: Diagnosis not present

## 2021-06-23 DIAGNOSIS — I700 Atherosclerosis of aorta: Secondary | ICD-10-CM | POA: Diagnosis not present

## 2021-06-23 DIAGNOSIS — Z6823 Body mass index (BMI) 23.0-23.9, adult: Secondary | ICD-10-CM | POA: Diagnosis not present

## 2021-06-23 DIAGNOSIS — D692 Other nonthrombocytopenic purpura: Secondary | ICD-10-CM | POA: Diagnosis not present

## 2021-06-23 DIAGNOSIS — R066 Hiccough: Secondary | ICD-10-CM | POA: Diagnosis not present

## 2021-06-23 DIAGNOSIS — T7840XA Allergy, unspecified, initial encounter: Secondary | ICD-10-CM | POA: Diagnosis not present

## 2021-06-23 DIAGNOSIS — Z789 Other specified health status: Secondary | ICD-10-CM | POA: Diagnosis not present

## 2021-06-24 ENCOUNTER — Encounter (INDEPENDENT_AMBULATORY_CARE_PROVIDER_SITE_OTHER): Payer: Self-pay | Admitting: *Deleted

## 2021-07-03 ENCOUNTER — Other Ambulatory Visit (INDEPENDENT_AMBULATORY_CARE_PROVIDER_SITE_OTHER): Payer: Self-pay

## 2021-07-03 ENCOUNTER — Ambulatory Visit (INDEPENDENT_AMBULATORY_CARE_PROVIDER_SITE_OTHER): Payer: Medicare PPO | Admitting: Gastroenterology

## 2021-07-03 ENCOUNTER — Encounter (INDEPENDENT_AMBULATORY_CARE_PROVIDER_SITE_OTHER): Payer: Self-pay | Admitting: Gastroenterology

## 2021-07-03 ENCOUNTER — Encounter (INDEPENDENT_AMBULATORY_CARE_PROVIDER_SITE_OTHER): Payer: Self-pay

## 2021-07-03 DIAGNOSIS — R112 Nausea with vomiting, unspecified: Secondary | ICD-10-CM | POA: Diagnosis not present

## 2021-07-03 DIAGNOSIS — R066 Hiccough: Secondary | ICD-10-CM | POA: Diagnosis not present

## 2021-07-03 DIAGNOSIS — K668 Other specified disorders of peritoneum: Secondary | ICD-10-CM

## 2021-07-03 MED ORDER — OMEPRAZOLE 40 MG PO CPDR: 40.0000 mg | DELAYED_RELEASE_CAPSULE | Freq: Every day | ORAL | 3 refills | Status: DC

## 2021-07-03 NOTE — Progress Notes (Signed)
[name], M.D. ?Gastroenterology & Hepatology ?[facility] ?[address] ?[place], [place] [address] ?Primary Care Physician: ?[name], MD ?[address] ?[place] [address] ? ?Referring MD: PCP ? ?Chief Complaint: Hiccups and vomiting ? ?History of Present Illness: ?Claudia [name] is a 76 y.o. female with past medical history of lung cancer status post radiation, chemotherapy and right upper lobe wedge resection, COPD, GERD, hypertension, ?Lymphocytic colitis, who presents for evaluation of hiccups and vomiting. ? ?Patient reports for the last 2 months she has presented recurrent episodes of hiccups while eating. She states that the hiccups are usually followed by vomiting thick contents and part of her meals. She has not been able to finish a meal since her symptoms started. She has hiccups 50% of the times she has a meal. Has lost 15 lb since her symptoms started. She did not have any symptoms with food consumption in the past. No abdominal bloating or pain.  The patient denies having any  fever, chills, hematochezia, melena, hematemesis, diarrhea, jaundice, pruritus. ? ?She states having some heartburn episodes when eating tomato based food and greasy food. She had a chronic history of GERD but very infrequently, however her symptoms became more often for the last 2 months. She only takes Rolaids when she has heartburn. No odynophagia. ? ?She was seen by gastroenterologist Dr. Collene Mares when she was living in [place] (moved to [place] recently) - states she has not had any evaluations for this , but was prescribed baclofen and chlorpromazine 25 mg qday. She stopped taking these medications as she did not feel any improvement. She was recently prescribed Reglan 5 mg by her PCP with each meal but has felt very mild improvement. ? ?Notably, she has presented chronic pneumoperitenum in previous imaging.  ? ?Regarding lung cancer: ?Status post 4 cycles of  systemic chemotherapy with carboplatin and etoposide. Last dose was given 11/14/2009. This was concurrent with radiotherapy. ?Status post prophylactic cranial irradiation completed January 28, 2010. ?Status post wedge resection of the right upper lobe lung nodule under the care of Dr. Cyndia Bent on 09/21/2013 and the final pathology was consistent with small cell lung cancer. ?systemic chemotherapy with carboplatin for AUC of 5 on day 1 and etoposide 120 mg/M2 on days 1, 2 and 3 with Neulasta support on day 4 every 3 weeks. Status post 4 cycles. First dose on 10/30/2013. ?She also received radiation therapy. ? ?Has been followed by Dr Julien Nordmann. Currently on active surveillance, last CT chest showed stable 5 mm RIGHT lower lobe pulmonary nodule and tree in bud nodularity in the RIGHT upper lobe. Post treatment changes in the LEFT chest without new or progressive findings and with slight interval decrease in the pleural effusion which layers dependently. There was also presence of  Patulous esophagus with debris in fluid in the lumen similar to previous imaging. ? ?No recent abdominal imaging is available  ? ?Last BHA:1937 - normal per patient, no reports available ?Last Colonoscopy:2013 - sigmoid diverticulosis, patchy loss of vascular marking, biopsies performed but no results available ? ?FHx: neg for any gastrointestinal/liver disease, mother lung cancer, aunt breast cancer ?Social: former smoking 2009, neg alcohol or illicit drug use ?Surgical: no abdominal surgeries ? ?Past Medical History: ?Past Medical History:  ?Diagnosis Date  ? Anemia   ? Arthritis   ? Bradycardia   ? mild,may be due to beta blocker therapy  ? COPD (chronic obstructive pulmonary disease) ([place])   ? GERD (gastroesophageal reflux  disease)   ? otc  ? Hypertension 06/01/2019  ? Local recurrence of lung cancer ([place]) dx'd 07/2013  ? rt thoracotomy chemo comp 12/2013  ? Lung cancer ([place]) 2010  ? Dr. Julien Nordmann, finished chemo, sp radiation, left upper   ?  Lymphocytic colitis   ? Pneumonia   ? ? ?Past Surgical History: ?Past Surgical History:  ?Procedure Laterality Date  ? BRONCHOSCOPY  2011  ? DILATION AND CURETTAGE OF UTERUS    ? NECK SURGERY  1980's  ? THORACOTOMY Right 09/21/2013  ? Procedure: THORACOTOMY MAJOR;  Surgeon: Gaye Pollack, MD;  Location: [facility];  Service: Thoracic;  Laterality: Right;  ? UTERINE FIBROID SURGERY  2012  ? WEDGE RESECTION Right 09/21/2013  ? Procedure: RIGHT UPPER LOBE WEDGE RESECTION;  Surgeon: Gaye Pollack, MD;  Location: [facility];  Service: Thoracic;  Laterality: Right;  ? ? ?Family History: ?Family History  ?Problem Relation Age of Onset  ? Brain cancer Mother   ?     brain cancer  ? Emphysema Father   ? Diabetes Son   ? Heart disease Paternal Grandfather   ? Breast cancer Maternal Aunt   ? ? ?Social History: ?Social History  ? ?Tobacco Use  ?Smoking Status Former  ? Packs/day: 1.00  ? Years: 35.00  ? Pack years: 35.00  ? Types: Cigarettes  ? Quit date: 03/31/2007  ? Years since quitting: 14.2  ? Passive exposure: Past  ?Smokeless Tobacco Never  ? ?Social History  ? ?Substance and Sexual Activity  ?Alcohol Use Yes  ? Comment: occassional about 3 drinks per year  ? ?Social History  ? ?Substance and Sexual Activity  ?Drug Use No  ? ? ?Allergies: ?Allergies  ?Allergen Reactions  ? Azithromycin Shortness Of Breath and Rash  ?  Took Tussionex at same time Jan 2013.  ? Tussionex Pennkinetic Er [Hydrocod Poli-Chlorphe Poli Er] Shortness Of Breath and Rash  ?  Took Azithromycin at same time Jan 2013  ? ? ?Medications: ?Current Outpatient Medications  ?Medication Sig Dispense Refill  ? Calcium Carbonate Antacid (TUMS PO) Take by mouth. Takes as needed    ? Cholecalciferol (VITAMIN D-3) 25 MCG (1000 UT) CAPS Take by mouth. Takes one daily    ? Coenzyme Q10 100 MG TABS Take 100 mg by mouth daily.    ? fluticasone (FLONASE) 50 MCG/ACT nasal spray Place 1 spray into both nostrils daily.    ? metoCLOPramide (REGLAN) 10 MG tablet Take 10 mg by mouth.  Takes one half tid    ? Probiotic Product (PROBIOTIC DAILY PO) Take 1 each by mouth daily.    ? Turmeric Curcumin 500 MG CAPS Take by mouth. One daily    ? Wheat Dextrin (BENEFIBER PO) Take by mouth. One spoonful in morning coffee    ? ?No current facility-administered medications for this visit.  ? ? ?Review of Systems: ?GENERAL: negative for malaise, night sweats ?HEENT: No changes in hearing or vision, no nose bleeds or other nasal problems. ?NECK: Negative for lumps, goiter, pain and significant neck swelling ?RESPIRATORY: Negative for cough, wheezing ?CARDIOVASCULAR: Negative for chest pain, leg swelling, palpitations, orthopnea ?GI: SEE HPI ?MUSCULOSKELETAL: Negative for joint pain or swelling, back pain, and muscle pain. ?SKIN: Negative for lesions, rash ?PSYCH: Negative for sleep disturbance, mood disorder and recent psychosocial stressors. ?HEMATOLOGY Negative for prolonged bleeding, bruising easily, and swollen nodes. ?ENDOCRINE: Negative for cold or heat intolerance, polyuria, polydipsia and goiter. ?NEURO: negative for tremor, gait imbalance, syncope and seizures. ?  The remainder of the review of systems is noncontributory. ? ? ?Physical Exam: ?BP (!) 160/83 (BP Location: Left Arm, Patient Position: Sitting, Cuff Size: Normal)   Pulse 86   Temp 98.1 ?F (36.7 ?C) (Oral)   Ht 5\' 2"  (1.575 m)   Wt 122 lb 4.8 oz (55.5 kg)   BMI 22.37 kg/m?  ?GENERAL: The patient is AO x3, in no acute distress. ?HEENT: Head is normocephalic and atraumatic. EOMI are intact. Mouth is well hydrated and without lesions. ?NECK: Supple. No masses ?LUNGS: Clear to auscultation. No presence of rhonchi/wheezing/rales. Adequate chest expansion ?HEART: RRR, normal s1 and s2. ?ABDOMEN: Soft, nontender, no guarding, no peritoneal signs, and nondistended. BS +. No masses. ?EXTREMITIES: Without any cyanosis, clubbing, rash, lesions or edema. ?NEUROLOGIC: AOx3, no focal motor deficit. ?SKIN: no jaundice, no rashes ? ? ?Imaging/Labs: ?as  above ? ?I personally reviewed and interpreted the available labs, imaging and endoscopic files. ? ?Impression and Plan: ?AMAYRANY CAFARO is a 76 y.o. female with past medical history of lung cancer status post rad

## 2021-07-03 NOTE — Patient Instructions (Addendum)
Schedule EGD  ?Start omeprazole 40 mg qday ?Stop Reglan ?May consider CT abdomen/pelvis and GES depending on EGD findings. ?Will discuss colonoscopy (with pills) in the future once upper GI symptoms have improved ? ?

## 2021-07-03 NOTE — H&P (View-Only) (Signed)
[name], M.D. ?Gastroenterology & Hepatology ?[facility] ?[address] ?[place], [place] [address] ?Primary Care Physician: ?[name], MD ?[address] ?[place] [address] ? ?Referring MD: PCP ? ?Chief Complaint: Hiccups and vomiting ? ?History of Present Illness: ?Claudia [name] is a 76 y.o. female with past medical history of lung cancer status post radiation, chemotherapy and right upper lobe wedge resection, COPD, GERD, hypertension, ?Lymphocytic colitis, who presents for evaluation of hiccups and vomiting. ? ?Patient reports for the last 2 months she has presented recurrent episodes of hiccups while eating. She states that the hiccups are usually followed by vomiting thick contents and part of her meals. She has not been able to finish a meal since her symptoms started. She has hiccups 50% of the times she has a meal. Has lost 15 lb since her symptoms started. She did not have any symptoms with food consumption in the past. No abdominal bloating or pain.  The patient denies having any  fever, chills, hematochezia, melena, hematemesis, diarrhea, jaundice, pruritus. ? ?She states having some heartburn episodes when eating tomato based food and greasy food. She had a chronic history of GERD but very infrequently, however her symptoms became more often for the last 2 months. She only takes Rolaids when she has heartburn. No odynophagia. ? ?She was seen by gastroenterologist Dr. Collene Mares when she was living in [place] (moved to [place] recently) - states she has not had any evaluations for this , but was prescribed baclofen and chlorpromazine 25 mg qday. She stopped taking these medications as she did not feel any improvement. She was recently prescribed Reglan 5 mg by her PCP with each meal but has felt very mild improvement. ? ?Notably, she has presented chronic pneumoperitenum in previous imaging.  ? ?Regarding lung cancer: ?Status post 4 cycles of  systemic chemotherapy with carboplatin and etoposide. Last dose was given 11/14/2009. This was concurrent with radiotherapy. ?Status post prophylactic cranial irradiation completed January 28, 2010. ?Status post wedge resection of the right upper lobe lung nodule under the care of Dr. Cyndia Bent on 09/21/2013 and the final pathology was consistent with small cell lung cancer. ?systemic chemotherapy with carboplatin for AUC of 5 on day 1 and etoposide 120 mg/M2 on days 1, 2 and 3 with Neulasta support on day 4 every 3 weeks. Status post 4 cycles. First dose on 10/30/2013. ?She also received radiation therapy. ? ?Has been followed by Dr Julien Nordmann. Currently on active surveillance, last CT chest showed stable 5 mm RIGHT lower lobe pulmonary nodule and tree in bud nodularity in the RIGHT upper lobe. Post treatment changes in the LEFT chest without new or progressive findings and with slight interval decrease in the pleural effusion which layers dependently. There was also presence of  Patulous esophagus with debris in fluid in the lumen similar to previous imaging. ? ?No recent abdominal imaging is available  ? ?Last [id number] - normal per patient, no reports available ?Last Colonoscopy:2013 - sigmoid diverticulosis, patchy loss of vascular marking, biopsies performed but no results available ? ?FHx: neg for any gastrointestinal/liver disease, mother lung cancer, aunt breast cancer ?Social: former smoking 2009, neg alcohol or illicit drug use ?Surgical: no abdominal surgeries ? ?Past Medical History: ?Past Medical History:  ?Diagnosis Date  ? Anemia   ? Arthritis   ? Bradycardia   ? mild,may be due to beta blocker therapy  ? COPD (chronic obstructive pulmonary disease) ([place])   ? GERD (gastroesophageal reflux  disease)   ? otc  ? Hypertension 06/01/2019  ? Local recurrence of lung cancer ([place]) dx'd 07/2013  ? rt thoracotomy chemo comp 12/2013  ? Lung cancer ([place]) 2010  ? Dr. Julien Nordmann, finished chemo, sp radiation, left upper   ?  Lymphocytic colitis   ? Pneumonia   ? ? ?Past Surgical History: ?Past Surgical History:  ?Procedure Laterality Date  ? BRONCHOSCOPY  2011  ? DILATION AND CURETTAGE OF UTERUS    ? NECK SURGERY  1980's  ? THORACOTOMY Right 09/21/2013  ? Procedure: THORACOTOMY MAJOR;  Surgeon: Gaye Pollack, MD;  Location: [facility];  Service: Thoracic;  Laterality: Right;  ? UTERINE FIBROID SURGERY  2012  ? WEDGE RESECTION Right 09/21/2013  ? Procedure: RIGHT UPPER LOBE WEDGE RESECTION;  Surgeon: Gaye Pollack, MD;  Location: [facility];  Service: Thoracic;  Laterality: Right;  ? ? ?Family History: ?Family History  ?Problem Relation Age of Onset  ? Brain cancer Mother   ?     brain cancer  ? Emphysema Father   ? Diabetes Son   ? Heart disease Paternal Grandfather   ? Breast cancer Maternal Aunt   ? ? ?Social History: ?Social History  ? ?Tobacco Use  ?Smoking Status Former  ? Packs/day: 1.00  ? Years: 35.00  ? Pack years: 35.00  ? Types: Cigarettes  ? Quit date: 03/31/2007  ? Years since quitting: 14.2  ? Passive exposure: Past  ?Smokeless Tobacco Never  ? ?Social History  ? ?Substance and Sexual Activity  ?Alcohol Use Yes  ? Comment: occassional about 3 drinks per year  ? ?Social History  ? ?Substance and Sexual Activity  ?Drug Use No  ? ? ?Allergies: ?Allergies  ?Allergen Reactions  ? Azithromycin Shortness Of Breath and Rash  ?  Took Tussionex at same time Jan 2013.  ? Tussionex Pennkinetic Er [Hydrocod Poli-Chlorphe Poli Er] Shortness Of Breath and Rash  ?  Took Azithromycin at same time Jan 2013  ? ? ?Medications: ?Current Outpatient Medications  ?Medication Sig Dispense Refill  ? Calcium Carbonate Antacid (TUMS PO) Take by mouth. Takes as needed    ? Cholecalciferol (VITAMIN D-3) 25 MCG (1000 UT) CAPS Take by mouth. Takes one daily    ? Coenzyme Q10 100 MG TABS Take 100 mg by mouth daily.    ? fluticasone (FLONASE) 50 MCG/ACT nasal spray Place 1 spray into both nostrils daily.    ? metoCLOPramide (REGLAN) 10 MG tablet Take 10 mg by mouth.  Takes one half tid    ? Probiotic Product (PROBIOTIC DAILY PO) Take 1 each by mouth daily.    ? Turmeric Curcumin 500 MG CAPS Take by mouth. One daily    ? Wheat Dextrin (BENEFIBER PO) Take by mouth. One spoonful in morning coffee    ? ?No current facility-administered medications for this visit.  ? ? ?Review of Systems: ?GENERAL: negative for malaise, night sweats ?HEENT: No changes in hearing or vision, no nose bleeds or other nasal problems. ?NECK: Negative for lumps, goiter, pain and significant neck swelling ?RESPIRATORY: Negative for cough, wheezing ?CARDIOVASCULAR: Negative for chest pain, leg swelling, palpitations, orthopnea ?GI: SEE HPI ?MUSCULOSKELETAL: Negative for joint pain or swelling, back pain, and muscle pain. ?SKIN: Negative for lesions, rash ?PSYCH: Negative for sleep disturbance, mood disorder and recent psychosocial stressors. ?HEMATOLOGY Negative for prolonged bleeding, bruising easily, and swollen nodes. ?ENDOCRINE: Negative for cold or heat intolerance, polyuria, polydipsia and goiter. ?NEURO: negative for tremor, gait imbalance, syncope and seizures. ?  The remainder of the review of systems is noncontributory. ? ? ?Physical Exam: ?BP (!) 160/83 (BP Location: Left Arm, Patient Position: Sitting, Cuff Size: Normal)   Pulse 86   Temp 98.1 ?F (36.7 ?C) (Oral)   Ht 5\' 2"  (1.575 m)   Wt 122 lb 4.8 oz (55.5 kg)   BMI 22.37 kg/m?  ?GENERAL: The patient is AO x3, in no acute distress. ?HEENT: Head is normocephalic and atraumatic. EOMI are intact. Mouth is well hydrated and without lesions. ?NECK: Supple. No masses ?LUNGS: Clear to auscultation. No presence of rhonchi/wheezing/rales. Adequate chest expansion ?HEART: RRR, normal s1 and s2. ?ABDOMEN: Soft, nontender, no guarding, no peritoneal signs, and nondistended. BS +. No masses. ?EXTREMITIES: Without any cyanosis, clubbing, rash, lesions or edema. ?NEUROLOGIC: AOx3, no focal motor deficit. ?SKIN: no jaundice, no rashes ? ? ?Imaging/Labs: ?as  above ? ?I personally reviewed and interpreted the available labs, imaging and endoscopic files. ? ?Impression and Plan: ?Claudia [name] is a 76 y.o. female with past medical history of lung cancer status post rad

## 2021-07-07 ENCOUNTER — Encounter (INDEPENDENT_AMBULATORY_CARE_PROVIDER_SITE_OTHER): Payer: Self-pay

## 2021-07-07 DIAGNOSIS — Z1339 Encounter for screening examination for other mental health and behavioral disorders: Secondary | ICD-10-CM | POA: Diagnosis not present

## 2021-07-07 DIAGNOSIS — Z1331 Encounter for screening for depression: Secondary | ICD-10-CM | POA: Diagnosis not present

## 2021-07-07 DIAGNOSIS — Z7189 Other specified counseling: Secondary | ICD-10-CM | POA: Diagnosis not present

## 2021-07-07 DIAGNOSIS — E2839 Other primary ovarian failure: Secondary | ICD-10-CM | POA: Diagnosis not present

## 2021-07-07 DIAGNOSIS — Z789 Other specified health status: Secondary | ICD-10-CM | POA: Diagnosis not present

## 2021-07-07 DIAGNOSIS — Z6822 Body mass index (BMI) 22.0-22.9, adult: Secondary | ICD-10-CM | POA: Diagnosis not present

## 2021-07-07 DIAGNOSIS — Z0000 Encounter for general adult medical examination without abnormal findings: Secondary | ICD-10-CM | POA: Diagnosis not present

## 2021-07-07 DIAGNOSIS — Z299 Encounter for prophylactic measures, unspecified: Secondary | ICD-10-CM | POA: Diagnosis not present

## 2021-07-07 DIAGNOSIS — I700 Atherosclerosis of aorta: Secondary | ICD-10-CM | POA: Diagnosis not present

## 2021-07-25 ENCOUNTER — Encounter (HOSPITAL_COMMUNITY)
Admission: RE | Admit: 2021-07-25 | Discharge: 2021-07-25 | Disposition: A | Discharge status: Home / Self Care | Payer: Medicare PPO | Source: Ambulatory Visit | Attending: Gastroenterology | Admitting: Gastroenterology

## 2021-07-25 ENCOUNTER — Other Ambulatory Visit: Payer: Self-pay

## 2021-07-25 ENCOUNTER — Encounter (HOSPITAL_COMMUNITY): Payer: Self-pay

## 2021-07-25 NOTE — Pre-Procedure Instructions (Signed)
Attempted pre-op phone call. Left voicemail for her to call us back. ?

## 2021-07-29 ENCOUNTER — Encounter (HOSPITAL_COMMUNITY): Payer: Self-pay | Admitting: Gastroenterology

## 2021-07-29 ENCOUNTER — Ambulatory Visit (HOSPITAL_COMMUNITY)
Admission: RE | Admit: 2021-07-29 | Discharge: 2021-07-29 | Disposition: A | Discharge status: Home / Self Care | Payer: Medicare PPO | Attending: Gastroenterology | Admitting: Gastroenterology

## 2021-07-29 ENCOUNTER — Ambulatory Visit (HOSPITAL_COMMUNITY): Payer: Medicare PPO | Admitting: Anesthesiology

## 2021-07-29 ENCOUNTER — Ambulatory Visit (HOSPITAL_BASED_OUTPATIENT_CLINIC_OR_DEPARTMENT_OTHER): Payer: Medicare PPO | Admitting: Anesthesiology

## 2021-07-29 ENCOUNTER — Encounter (HOSPITAL_COMMUNITY)
Admission: RE | Disposition: A | Discharge status: Home / Self Care | Payer: Self-pay | Source: Home / Self Care | Attending: Gastroenterology

## 2021-07-29 DIAGNOSIS — J449 Chronic obstructive pulmonary disease, unspecified: Secondary | ICD-10-CM | POA: Diagnosis not present

## 2021-07-29 DIAGNOSIS — R1110 Vomiting, unspecified: Secondary | ICD-10-CM | POA: Diagnosis not present

## 2021-07-29 DIAGNOSIS — Z87891 Personal history of nicotine dependence: Secondary | ICD-10-CM | POA: Insufficient documentation

## 2021-07-29 DIAGNOSIS — D649 Anemia, unspecified: Secondary | ICD-10-CM | POA: Diagnosis not present

## 2021-07-29 DIAGNOSIS — I10 Essential (primary) hypertension: Secondary | ICD-10-CM

## 2021-07-29 DIAGNOSIS — Z85118 Personal history of other malignant neoplasm of bronchus and lung: Secondary | ICD-10-CM | POA: Diagnosis not present

## 2021-07-29 DIAGNOSIS — Z923 Personal history of irradiation: Secondary | ICD-10-CM | POA: Insufficient documentation

## 2021-07-29 DIAGNOSIS — Z79899 Other long term (current) drug therapy: Secondary | ICD-10-CM | POA: Diagnosis not present

## 2021-07-29 DIAGNOSIS — R066 Hiccough: Secondary | ICD-10-CM | POA: Insufficient documentation

## 2021-07-29 DIAGNOSIS — K219 Gastro-esophageal reflux disease without esophagitis: Secondary | ICD-10-CM | POA: Insufficient documentation

## 2021-07-29 DIAGNOSIS — Z9221 Personal history of antineoplastic chemotherapy: Secondary | ICD-10-CM | POA: Diagnosis not present

## 2021-07-29 HISTORY — PX: ESOPHAGOGASTRODUODENOSCOPY (EGD) WITH PROPOFOL: SHX5813

## 2021-07-29 SURGERY — ESOPHAGOGASTRODUODENOSCOPY (EGD) WITH PROPOFOL
Anesthesia: General

## 2021-07-29 MED ORDER — LACTATED RINGERS IV SOLN: INTRAVENOUS | Status: DC | PRN

## 2021-07-29 MED ORDER — LIDOCAINE HCL (CARDIAC) PF 100 MG/5ML IV SOSY
PREFILLED_SYRINGE | INTRAVENOUS | Status: DC | PRN
  Administered 2021-07-29: 50 mg via INTRAVENOUS

## 2021-07-29 MED ORDER — PROPOFOL 10 MG/ML IV BOLUS
INTRAVENOUS | Status: DC | PRN
  Administered 2021-07-29: 100 mg via INTRAVENOUS

## 2021-07-29 NOTE — Anesthesia Postprocedure Evaluation (Signed)
Anesthesia Post Note ? ?Patient: Claudia [name] ? ?Procedure(s) Performed: ESOPHAGOGASTRODUODENOSCOPY (EGD) WITH PROPOFOL ? ?Patient location during evaluation: Phase II ?Anesthesia Type: General ?Level of consciousness: awake ?Pain management: pain level controlled ?Vital Signs Assessment: post-procedure vital signs reviewed and stable ?Respiratory status: spontaneous breathing and respiratory function stable ?Cardiovascular status: blood pressure returned to baseline and stable ?Postop Assessment: no headache and no apparent nausea or vomiting ?Anesthetic complications: no ?Comments: Late entry ? ? ?No notable events documented. ? ? ?Last Vitals:  ?Vitals:  ? 07/29/21 1133 07/29/21 1249  ?BP: (!) 162/72 (!) 117/54  ?Resp: 17 16  ?Temp: 36.8 ?C 36.7 ?C  ?SpO2: 97% 99%  ?  ?Last Pain:  ?Vitals:  ? 07/29/21 1249  ?TempSrc: Oral  ?PainSc: 0-No pain  ? ? ?  ?  ?  ?  ?  ?  ? ?Louann Sjogren ? ? ? ? ?

## 2021-07-29 NOTE — Op Note (Signed)
[facility] ?Patient Name: Claudia [name] ?Procedure Date: 07/29/2021 11:54 AM ?MRN: [id number] ?Date of Birth: 1946/02/22 ?Attending MD: [name] ,  ?CSN: [id number] ?Age: 76 ?Admit Type: Outpatient ?Procedure:                Upper GI endoscopy ?Indications:              Vomiting, hiccups ?Providers:                [name], Rosina Lowenstein, RN, Aram Candela ?Referring MD:              ?Medicines:                Monitored Anesthesia Care ?Complications:            No immediate complications. ?Estimated Blood Loss:     Estimated blood loss: none. ?Procedure:                Pre-Anesthesia Assessment: ?                          - Prior to the procedure, a History and Physical  ?                          was performed, and patient medications, allergies  ?                          and sensitivities were reviewed. The patient's  ?                          tolerance of previous anesthesia was reviewed. ?                          - The risks and benefits of the procedure and the  ?                          sedation options and risks were discussed with the  ?                          patient. All questions were answered and informed  ?                          consent was obtained. ?                          - ASA Grade Assessment: III - A patient with severe  ?                          systemic disease. ?                          After obtaining informed consent, the endoscope was  ?                          passed under direct vision. Throughout the  ?                          procedure, the patient's blood pressure, pulse,  and  ?                          oxygen saturations were monitored continuously. The  ?                          GIF-H190 ([id number]) scope was introduced through the  ?                          mouth, and advanced to the second part of duodenum.  ?                          The upper GI endoscopy was accomplished without  ?                          difficulty. The patient tolerated the  procedure  ?                          well. ?Scope In: 12:41:12 PM ?Scope Out: 12:43:24 PM ?Total Procedure Duration: 0 hours 2 minutes 12 seconds  ?Findings: ?     Fluid was found in the entire esophagus. Fluid aspiration was performed. ?     A large amount of food (residue) was found in the gastric body and in  ?     the gastric antrum. I attempted to do a thorough evaluation of the  ?     stomach but given food debris there are areas that could not be  ?     completely examined. ?     The examined duodenum was normal. ?     Note: symptoms possibly related to gastroparesis ?Impression:               - Fluid in the esophagus. Fluid aspiration  ?                          performed. ?                          - A large amount of food (residue) in the stomach. ?                          - Normal examined duodenum. ?Moderate Sedation: ?     Per Anesthesia Care ?Recommendation:           - Discharge patient to home (ambulatory). ?                          - Resume previous diet, attempt to eat small  ?                          portions throughout the day. ?                          - Continue present medications. ?                          - Schedule gastric emptying study and CT abdomen  ?  with IV contrast. ?Procedure Code(s):        --- Professional --- ?                          [phone], Esophagogastroduodenoscopy, flexible,  ?                          transoral; diagnostic, including collection of  ?                          specimen(s) by brushing or washing, when performed  ?                          (separate procedure) ?Diagnosis Code(s):        --- Professional --- ?                          R11.10, Vomiting, unspecified ?CPT copyright 2019 American Medical Association. All rights reserved. ?The codes documented in this report are preliminary and upon coder review may  ?be revised to meet current compliance requirements. ?[name], MD ?[name],  ?07/29/2021 12:52:26  PM ?This report has been signed electronically. ?Number of Addenda: 0 ?

## 2021-07-29 NOTE — Anesthesia Preprocedure Evaluation (Signed)
Anesthesia Evaluation  ?Patient identified by MRN, date of birth, ID band ?Patient awake ? ? ? ?Reviewed: ?Allergy & Precautions, H&P , NPO status , Patient's Chart, lab work & pertinent test results, reviewed documented beta blocker date and time  ? ?Airway ?Mallampati: II ? ?TM Distance: >3 FB ?Neck ROM: full ? ? ? Dental ?no notable dental hx. ? ?  ?Pulmonary ?COPD, former smoker,  ?  ?Pulmonary exam normal ?breath sounds clear to auscultation ? ? ? ? ? ? Cardiovascular ?Exercise Tolerance: Good ?hypertension, negative cardio ROS ? ? ?Rhythm:regular Rate:Normal ? ? ?  ?Neuro/Psych ?negative neurological ROS ? negative psych ROS  ? GI/Hepatic ?Neg liver ROS, GERD  Medicated,  ?Endo/Other  ?negative endocrine ROS ? Renal/GU ?negative Renal ROS  ?negative genitourinary ?  ?Musculoskeletal ? ? Abdominal ?  ?Peds ? Hematology ? ?(+) Blood dyscrasia, anemia ,   ?Anesthesia Other Findings ? ? Reproductive/Obstetrics ?negative OB ROS ? ?  ? ? ? ? ? ? ? ? ? ? ? ? ? ?  ?  ? ? ? ? ? ? ? ? ?Anesthesia Physical ?Anesthesia Plan ? ?ASA: 3 ? ?Anesthesia Plan: General  ? ?Post-op Pain Management:   ? ?Induction:  ? ?PONV Risk Score and Plan: Propofol infusion ? ?Airway Management Planned:  ? ?Additional Equipment:  ? ?Intra-op Plan:  ? ?Post-operative Plan:  ? ?Informed Consent: I have reviewed the patients History and Physical, chart, labs and discussed the procedure including the risks, benefits and alternatives for the proposed anesthesia with the patient or authorized representative who has indicated his/her understanding and acceptance.  ? ? ? ?Dental Advisory Given ? ?Plan Discussed with: CRNA ? ?Anesthesia Plan Comments:   ? ? ? ? ? ? ?Anesthesia Quick Evaluation ? ?

## 2021-07-29 NOTE — Discharge Instructions (Signed)
You are being discharged to home.  ?Resume your previous diet.  ?Continue your present medications.  ?Schedule gastric emptying study and CT abdomen with IV contrast. ?

## 2021-07-29 NOTE — Transfer of Care (Signed)
Immediate Anesthesia Transfer of Care Note ? ?Patient: Claudia [name] ? ?Procedure(s) Performed: ESOPHAGOGASTRODUODENOSCOPY (EGD) WITH PROPOFOL ? ?Patient Location: Short Stay ? ?Anesthesia Type:General ? ?Level of Consciousness: awake and oriented ? ?Airway & Oxygen Therapy: Patient Spontanous Breathing ? ?Post-op Assessment: Report given to RN and Post -op Vital signs reviewed and stable ? ?Post vital signs: Reviewed and stable ? ?Last Vitals:  ?Vitals Value Taken Time  ?BP    ?Temp    ?Pulse    ?Resp    ?SpO2    ? ? ?Last Pain:  ?Vitals:  ? 07/29/21 1238  ?TempSrc:   ?PainSc: 0-No pain  ?   ? ?Patients Stated Pain Goal: 6 (07/29/21 1133) ? ?Complications: No notable events documented. ?

## 2021-07-29 NOTE — Anesthesia Procedure Notes (Signed)
Date/Time: 07/29/2021 12:42 PM ?Performed by: Orlie Dakin, CRNA ?Pre-anesthesia Checklist: Patient identified, Emergency Drugs available, Suction available and Patient being monitored ?Patient Re-evaluated:Patient Re-evaluated prior to induction ?Oxygen Delivery Method: Nasal cannula ?Induction Type: IV induction ?Placement Confirmation: positive ETCO2 ? ? ? ? ?

## 2021-07-29 NOTE — Interval H&P Note (Signed)
History and Physical Interval Note: ? ?07/29/2021 ?11:26 AM ? ?Claudia [name]  has presented today for surgery, with the diagnosis of Vomiting Hiccups.  The various methods of treatment have been discussed with the patient and family. After consideration of risks, benefits and other options for treatment, the patient has consented to  Procedure(s) with comments: ?ESOPHAGOGASTRODUODENOSCOPY (EGD) WITH PROPOFOL (N/A) - 1235 ASA 2 as a surgical intervention.  The patient's history has been reviewed, patient examined, no change in status, stable for surgery.  I have reviewed the patient's chart and labs.  Questions were answered to the patient's satisfaction.   ? ? ?Claudia [name] ? ? ?

## 2021-08-05 ENCOUNTER — Other Ambulatory Visit (INDEPENDENT_AMBULATORY_CARE_PROVIDER_SITE_OTHER): Payer: Self-pay

## 2021-08-05 ENCOUNTER — Telehealth (INDEPENDENT_AMBULATORY_CARE_PROVIDER_SITE_OTHER): Payer: Self-pay

## 2021-08-05 DIAGNOSIS — R112 Nausea with vomiting, unspecified: Secondary | ICD-10-CM

## 2021-08-05 NOTE — Telephone Encounter (Signed)
Thanks

## 2021-08-05 NOTE — Telephone Encounter (Signed)
Gastric Emptying Study is scheduled on Wednesday 08/13/21 at 8:00 am and Ct abd/pel w/cm per radiology on Wednesday 09/10/21 at 4:00 and Claudia [name] is aware  ?

## 2021-08-06 ENCOUNTER — Encounter (HOSPITAL_COMMUNITY): Payer: Self-pay | Admitting: Gastroenterology

## 2021-08-13 ENCOUNTER — Ambulatory Visit (HOSPITAL_COMMUNITY)
Admission: RE | Admit: 2021-08-13 | Discharge: 2021-08-13 | Disposition: A | Discharge status: Home / Self Care | Payer: Medicare PPO | Source: Ambulatory Visit | Attending: Gastroenterology | Admitting: Gastroenterology

## 2021-08-13 ENCOUNTER — Encounter (HOSPITAL_COMMUNITY): Payer: Self-pay

## 2021-08-13 DIAGNOSIS — K30 Functional dyspepsia: Secondary | ICD-10-CM | POA: Diagnosis not present

## 2021-08-13 DIAGNOSIS — R634 Abnormal weight loss: Secondary | ICD-10-CM | POA: Diagnosis not present

## 2021-08-13 DIAGNOSIS — R112 Nausea with vomiting, unspecified: Secondary | ICD-10-CM | POA: Insufficient documentation

## 2021-08-13 DIAGNOSIS — K3184 Gastroparesis: Secondary | ICD-10-CM | POA: Diagnosis not present

## 2021-08-13 DIAGNOSIS — R6881 Early satiety: Secondary | ICD-10-CM | POA: Diagnosis not present

## 2021-08-13 MED ORDER — TECHNETIUM TC 99M SULFUR COLLOID
2.0000 | Freq: Once | INTRAVENOUS | Status: AC | PRN
  Administered 2021-08-13: 2.2 via ORAL

## 2021-08-20 ENCOUNTER — Encounter (INDEPENDENT_AMBULATORY_CARE_PROVIDER_SITE_OTHER): Payer: Self-pay | Admitting: Gastroenterology

## 2021-08-27 DIAGNOSIS — H40013 Open angle with borderline findings, low risk, bilateral: Secondary | ICD-10-CM | POA: Diagnosis not present

## 2021-08-27 DIAGNOSIS — H04123 Dry eye syndrome of bilateral lacrimal glands: Secondary | ICD-10-CM | POA: Diagnosis not present

## 2021-08-27 DIAGNOSIS — H26493 Other secondary cataract, bilateral: Secondary | ICD-10-CM | POA: Diagnosis not present

## 2021-08-27 DIAGNOSIS — D3132 Benign neoplasm of left choroid: Secondary | ICD-10-CM | POA: Diagnosis not present

## 2021-09-04 ENCOUNTER — Ambulatory Visit (INDEPENDENT_AMBULATORY_CARE_PROVIDER_SITE_OTHER): Payer: Medicare PPO | Admitting: Gastroenterology

## 2021-09-10 ENCOUNTER — Ambulatory Visit (HOSPITAL_COMMUNITY): Payer: Medicare PPO

## 2021-09-11 DIAGNOSIS — R21 Rash and other nonspecific skin eruption: Secondary | ICD-10-CM | POA: Diagnosis not present

## 2021-09-11 DIAGNOSIS — R5383 Other fatigue: Secondary | ICD-10-CM | POA: Diagnosis not present

## 2021-09-11 DIAGNOSIS — R059 Cough, unspecified: Secondary | ICD-10-CM | POA: Diagnosis not present

## 2021-09-11 DIAGNOSIS — E538 Deficiency of other specified B group vitamins: Secondary | ICD-10-CM | POA: Diagnosis not present

## 2021-09-11 DIAGNOSIS — E559 Vitamin D deficiency, unspecified: Secondary | ICD-10-CM | POA: Diagnosis not present

## 2021-09-11 DIAGNOSIS — Z299 Encounter for prophylactic measures, unspecified: Secondary | ICD-10-CM | POA: Diagnosis not present

## 2021-09-15 ENCOUNTER — Ambulatory Visit (HOSPITAL_COMMUNITY)
Admission: RE | Admit: 2021-09-15 | Discharge: 2021-09-15 | Disposition: A | Discharge status: Home / Self Care | Payer: Medicare PPO | Source: Ambulatory Visit | Attending: Gastroenterology | Admitting: Gastroenterology

## 2021-09-15 DIAGNOSIS — R066 Hiccough: Secondary | ICD-10-CM | POA: Diagnosis not present

## 2021-09-15 DIAGNOSIS — R634 Abnormal weight loss: Secondary | ICD-10-CM | POA: Diagnosis not present

## 2021-09-15 DIAGNOSIS — R112 Nausea with vomiting, unspecified: Secondary | ICD-10-CM | POA: Diagnosis not present

## 2021-09-15 DIAGNOSIS — Z85118 Personal history of other malignant neoplasm of bronchus and lung: Secondary | ICD-10-CM | POA: Diagnosis not present

## 2021-09-15 MED ORDER — IOHEXOL 300 MG/ML  SOLN
80.0000 mL | Freq: Once | INTRAMUSCULAR | Status: AC | PRN
  Administered 2021-09-15: 80 mL via INTRAVENOUS

## 2021-09-16 LAB — POCT I-STAT CREATININE: Creatinine, Ser: 0.9 mg/dL (ref 0.44–1.00)

## 2021-09-18 DIAGNOSIS — D485 Neoplasm of uncertain behavior of skin: Secondary | ICD-10-CM | POA: Diagnosis not present

## 2021-09-18 DIAGNOSIS — L308 Other specified dermatitis: Secondary | ICD-10-CM | POA: Diagnosis not present

## 2021-09-18 DIAGNOSIS — L309 Dermatitis, unspecified: Secondary | ICD-10-CM | POA: Diagnosis not present

## 2021-09-25 ENCOUNTER — Ambulatory Visit (INDEPENDENT_AMBULATORY_CARE_PROVIDER_SITE_OTHER): Payer: Medicare PPO | Admitting: Gastroenterology

## 2021-09-25 ENCOUNTER — Encounter (INDEPENDENT_AMBULATORY_CARE_PROVIDER_SITE_OTHER): Payer: Self-pay | Admitting: Gastroenterology

## 2021-09-25 VITALS — BP 132/74 | HR 96 | Temp 98.3°F | Ht 62.0 in | Wt 111.5 lb

## 2021-09-25 DIAGNOSIS — K869 Disease of pancreas, unspecified: Secondary | ICD-10-CM

## 2021-09-25 DIAGNOSIS — R066 Hiccough: Secondary | ICD-10-CM | POA: Diagnosis not present

## 2021-09-25 DIAGNOSIS — K3184 Gastroparesis: Secondary | ICD-10-CM | POA: Diagnosis not present

## 2021-09-25 NOTE — Patient Instructions (Addendum)
We will schedule MRCP for pancreatic lesion/cyst Try to do Smaller meals, 4-6x/day, low fat and low fiber are usually best Protein shakes 2-3x/day if you are able to tolerate as this will help to avoid further weight loss Please let me know if hiccups become more frequent or you have vomiting  Please continue to watch weight, if you lose more than 5 pounds in one week, please make me aware  Follow up 3 months

## 2021-09-25 NOTE — Progress Notes (Addendum)
Referring Provider: [name], MD Primary Care Physician:  [name], MD Primary GI Physician: Jenetta Downer  Chief Complaint  Patient presents with   Results    3 month follow up on vomiting and hiccups. Also follow up on recent test results. States she rarely vomites and hiccups are less frequent. Does not have much of an appetite. Still losing weight. Tried meal replacement shakes but does not like them.     HPI:   Claudia [name] is a 76 y.o. female with past medical history of lung cancer status post radiation, chemotherapy and right upper lobe wedge resection, COPD, GERD, hypertension, ?Lymphocytic colitis  Patient presenting today for follow up of vomiting and hiccups   Recent CT on 6/19 with Interval development of 0.3 cm proximal pancreatic hypodensity  Last seen April 2023, having hiccups and nausea at that time, scheduled for EGD as outlined below  Today, she states that she is trying to eat but doesn't have much an appetite. She is eating some ice cream. Husband reports that she does not eat much, maybe 8-10 bites when she sits down for a meal. She is not having nausea or vomiting. Hiccups are occasional, maybe once per day. Much Better than previously. Will have a couple of hiccups and then this will pass. She has no abdominal pain. She has lost 11 lbs since her last visit in April. She has tried protein shakes but does not like the taste of these. She has very occasional heartburn/acid reflux. Denies any dysphagia. GES in May 2023 showed severe delay in gastric emptying, though she did not have improved symptoms previously with Reglan.  Reports they went to the beach recently and patient was very tired and feeling unwell, she saw PCP and had chest xray on 6/16 which showed some left sided pneumonia for which she was treated with levaquin. She is feeling a little better but still a little fatigued.   Patient denies melena, hematochezia, nausea, vomiting, diarrhea,  constipation, dysphagia, odyonophagia, early satiety.  Last Colonoscopy:Last Colonoscopy:2013 - sigmoid diverticulosis, patchy loss of vascular marking, biopsies performed but no results available Last Endoscopy:5/2/23Fluid in the esophagus. Fluid aspiration performed. - A large amount of food (residue) in the stomach. - Normal examined duodenum GES:08/13/21: severe delay in gastric emptying, recommend eat small meals throughout the day  Recommendations:    Past Medical History:  Diagnosis Date   Anemia    Arthritis    Bradycardia    mild,may be due to beta blocker therapy   COPD (chronic obstructive pulmonary disease) (HCC)    GERD (gastroesophageal reflux disease)    otc   Hypertension 06/01/2019   pt denies   Local recurrence of lung cancer ([place]) dx'd 07/2013   rt thoracotomy chemo comp 12/2013   Lung cancer ([facility]) 03/30/2008   Dr. Julien Nordmann, finished chemo, sp radiation, left upper    Lymphocytic colitis    Pneumonia     Past Surgical History:  Procedure Laterality Date   BRONCHOSCOPY  2011   DILATION AND CURETTAGE OF UTERUS     ESOPHAGOGASTRODUODENOSCOPY (EGD) WITH PROPOFOL N/A 07/29/2021   Procedure: ESOPHAGOGASTRODUODENOSCOPY (EGD) WITH PROPOFOL;  Surgeon: Harvel Quale, MD;  Location: [facility];  Service: Gastroenterology;  Laterality: N/A;  1235 ASA 2   NECK SURGERY  1980's   THORACOTOMY Right 09/21/2013   Procedure: THORACOTOMY MAJOR;  Surgeon: Gaye Pollack, MD;  Location: [facility] OR;  Service: Thoracic;  Laterality: Right;   UTERINE FIBROID SURGERY  2012   WEDGE RESECTION Right 09/21/2013   Procedure: RIGHT UPPER LOBE WEDGE RESECTION;  Surgeon: Gaye Pollack, MD;  Location: [facility] OR;  Service: Thoracic;  Laterality: Right;    Current Outpatient Medications  Medication Sig Dispense Refill   Calcium Carbonate Antacid (TUMS PO) Take 500-1,000 mg by mouth 3 (three) times daily as needed (indigestion/heartburn.).     Cholecalciferol (VITAMIN D-3) 25 MCG (1000  UT) CAPS Take 1,000 Units by mouth daily.     Coenzyme Q10 100 MG TABS Take 100 mg by mouth daily.     fluticasone (FLONASE) 50 MCG/ACT nasal spray Place 1 spray into both nostrils daily as needed for allergies.     Probiotic Product (PROBIOTIC DAILY PO) Take 1 capsule by mouth daily.     tretinoin (RETIN-A) 0.1 % cream Apply 1 application. topically at bedtime.     Turmeric Curcumin 500 MG CAPS Take 500 mg by mouth daily.     Wheat Dextrin (BENEFIBER PO) Take 1 Dose by mouth daily. One spoonful in morning coffee     omeprazole (PRILOSEC) 40 MG capsule Take 1 capsule (40 mg total) by mouth daily. (Patient not taking: Reported on 09/25/2021) 90 capsule 3   No current facility-administered medications for this visit.    Allergies as of 09/25/2021 - Review Complete 09/25/2021  Allergen Reaction Noted   Azithromycin Shortness Of Breath and Rash 04/09/2011   Tussionex pennkinetic er [hydrocod poli-chlorphe poli er] Shortness Of Breath and Rash 04/09/2011    Family History  Problem Relation Age of Onset   Brain cancer Mother        brain cancer   Emphysema Father    Diabetes Son    Heart disease Paternal Grandfather    Breast cancer Maternal Aunt     Social History   Socioeconomic History   Marital status: Married    Spouse name: Not on file   Number of children: Not on file   Years of education: Not on file   Highest education level: Not on file  Occupational History   Occupation: clerical    Employer: [employer]  Tobacco Use   Smoking status: Former    Packs/day: 1.00    Years: 35.00    Total pack years: 35.00    Types: Cigarettes    Quit date: 03/31/2007    Years since quitting: 14.4    Passive exposure: Past   Smokeless tobacco: Never  Vaping Use   Vaping Use: Never used  Substance and Sexual Activity   Alcohol use: Yes    Comment: occassional about 3 drinks per year   Drug use: No   Sexual activity: Never  Other Topics Concern   Not on file  Social History  Narrative   Not on file   Social Determinants of Health   Financial Resource Strain: Not on file  Food Insecurity: Not on file  Transportation Needs: Not on file  Physical Activity: Not on file  Stress: Not on file  Social Connections: Not on file   Review of systems General: negative for malaise, night sweats, fever, chills, +fatigue +weight loss  Neck: Negative for lumps, goiter, pain and significant neck swelling Resp: Negative for cough, wheezing, dyspnea at rest CV: Negative for chest pain, leg swelling, palpitations, orthopnea GI: denies melena, hematochezia, nausea, vomiting, diarrhea, constipation, dysphagia, odyonophagia, early satiety. +unintentional weight loss. +hiccups +decreased appetite MSK: Negative for joint pain or swelling, back pain, and muscle pain. Derm: Negative for itching or rash Psych: Denies  depression, anxiety, memory loss, confusion. No homicidal or suicidal ideation.  Heme: Negative for prolonged bleeding, bruising easily, and swollen nodes. Endocrine: Negative for cold or heat intolerance, polyuria, polydipsia and goiter. Neuro: negative for tremor, gait imbalance, syncope and seizures. The remainder of the review of systems is noncontributory.  Physical Exam: BP 132/74 (BP Location: Left Arm, Patient Position: Sitting, Cuff Size: Normal)   Pulse 96   Temp 98.3 F (36.8 C) (Oral)   Ht 5\' 2"  (1.575 m)   Wt 111 lb 8 oz (50.6 kg)   BMI 20.39 kg/m  General:   Alert and oriented. No distress noted. Pleasant and cooperative.  Head:  Normocephalic and atraumatic. Eyes:  Conjuctiva clear without scleral icterus. Mouth:  Oral mucosa pink and moist. Good dentition. No lesions. Heart: Normal rate and rhythm, s1 and s2 heart sounds present.  Lungs: wheezing noted to lower lobes Abdomen:  +BS, soft, non-tender and non-distended. No rebound or guarding. No HSM or masses noted. Derm: No palmar erythema or jaundice Msk:  Symmetrical without gross deformities.  Normal posture. Extremities:  Without edema. Neurologic:  Alert and  oriented x4 Psych:  Alert and cooperative. Normal mood and affect.  Invalid input(s): "6 MONTHS"   ASSESSMENT: Claudia [name] is a 76 y.o. female presenting today for follow up of vomiting and hiccups  GES in may 2023 with severe delay in gastric emptying, symptoms not previously improved on Reglan, though today she reports vomiting has resolved. Hiccups are still occurring but much less frequently and only last a short time when they do occur. She is still having lack of appetite, likely secondary to delayed emptying, and down 11 pounds since her last visit in April. She did not tolerate the taste of pre mixed protein shakes, I encouraged her to try making her own at home with protein powder to get a more desired taste in order to help maintain nutrition. She should also liberalize her diet to help maintain weight. Discussed 4-6 small meals per day, low in fat and fiber as these type foods are typically not well tolerated in gastroparesis patients.   We talked about other medication options to include Domperidone as she did not have improvement with reglan, however, after discussion of possible side effects, patient wishes to hold off on this at this time. Other considerations for medical treatment of her gastroparesis include Erythromycin,however, this would be a last resort if symptoms cannot be managed with dietary modifications or if she continues to lose substantial weight as there are concerns for developing abx resistance or tolerance to the medication, among other serious potential side effects.   In regards to findings of new pancreatic lesion on recent CT A/P with contrast, recommend proceeding with MRCP for further evaluation of this as we cannot rule out simple cyst or underlying malignancy at this time. Patient is amenable to further evaluation with MRCP. We will get this scheduled.   She is due for surveillance  colonoscopy as last was in 2013, patient prefers to hold off on this for now and re address at next OV.   She does have follow up Chest CT on 7/6 for hx of small cell lung cancer in June 2011/recurrence June 2015  PLAN:  Schedule MRCP for pancreatic  2. Protein shakes 2-3x/day 3. Liberalize diet 4. Discuss repeat colonoscopy at next OV 5. 4-6 small meals per day with low fat, low fiber diet 6. Pt to make me aware of more than 5 lbs weight loss in  one week 7. Considerations for other medical therapy of gastroparesis if symptoms worsen/weight loss continues 8. Further considerations for referral to [facility] for G-POEM evaluation   All questions were answered, patient verbalized understanding and is in agreement with plan as outlined above.   Follow Up: 3 months  Cleo Villamizar L. Alver Sorrow, MSN, APRN, AGNP-C Adult-Gerontology Nurse Practitioner [facility]

## 2021-09-29 DIAGNOSIS — I700 Atherosclerosis of aorta: Secondary | ICD-10-CM | POA: Diagnosis not present

## 2021-09-29 DIAGNOSIS — Z85118 Personal history of other malignant neoplasm of bronchus and lung: Secondary | ICD-10-CM | POA: Diagnosis not present

## 2021-09-29 DIAGNOSIS — J069 Acute upper respiratory infection, unspecified: Secondary | ICD-10-CM | POA: Diagnosis not present

## 2021-09-29 DIAGNOSIS — D692 Other nonthrombocytopenic purpura: Secondary | ICD-10-CM | POA: Diagnosis not present

## 2021-09-29 DIAGNOSIS — Z299 Encounter for prophylactic measures, unspecified: Secondary | ICD-10-CM | POA: Diagnosis not present

## 2021-10-01 DIAGNOSIS — K869 Disease of pancreas, unspecified: Secondary | ICD-10-CM | POA: Insufficient documentation

## 2021-10-02 ENCOUNTER — Other Ambulatory Visit: Payer: Self-pay

## 2021-10-02 ENCOUNTER — Ambulatory Visit (HOSPITAL_COMMUNITY)
Admission: RE | Admit: 2021-10-02 | Discharge: 2021-10-02 | Disposition: A | Discharge status: Home / Self Care | Payer: Medicare PPO | Source: Ambulatory Visit | Attending: Internal Medicine | Admitting: Internal Medicine

## 2021-10-02 ENCOUNTER — Inpatient Hospital Stay: Payer: Medicare PPO | Attending: Internal Medicine

## 2021-10-02 ENCOUNTER — Ambulatory Visit (INDEPENDENT_AMBULATORY_CARE_PROVIDER_SITE_OTHER): Payer: Medicare PPO | Admitting: Gastroenterology

## 2021-10-02 DIAGNOSIS — R634 Abnormal weight loss: Secondary | ICD-10-CM | POA: Insufficient documentation

## 2021-10-02 DIAGNOSIS — C3411 Malignant neoplasm of upper lobe, right bronchus or lung: Secondary | ICD-10-CM | POA: Insufficient documentation

## 2021-10-02 DIAGNOSIS — J90 Pleural effusion, not elsewhere classified: Secondary | ICD-10-CM | POA: Diagnosis not present

## 2021-10-02 DIAGNOSIS — C3490 Malignant neoplasm of unspecified part of unspecified bronchus or lung: Secondary | ICD-10-CM

## 2021-10-02 DIAGNOSIS — Z902 Acquired absence of lung [part of]: Secondary | ICD-10-CM | POA: Insufficient documentation

## 2021-10-02 DIAGNOSIS — D649 Anemia, unspecified: Secondary | ICD-10-CM | POA: Insufficient documentation

## 2021-10-02 DIAGNOSIS — N2889 Other specified disorders of kidney and ureter: Secondary | ICD-10-CM | POA: Insufficient documentation

## 2021-10-02 DIAGNOSIS — K209 Esophagitis, unspecified without bleeding: Secondary | ICD-10-CM | POA: Insufficient documentation

## 2021-10-02 DIAGNOSIS — R6881 Early satiety: Secondary | ICD-10-CM | POA: Insufficient documentation

## 2021-10-02 DIAGNOSIS — J439 Emphysema, unspecified: Secondary | ICD-10-CM | POA: Diagnosis not present

## 2021-10-02 LAB — CBC WITH DIFFERENTIAL (CANCER CENTER ONLY)
Abs Immature Granulocytes: 0.08 10*3/uL — ABNORMAL HIGH (ref 0.00–0.07)
Basophils Absolute: 0 10*3/uL (ref 0.0–0.1)
Basophils Relative: 0 %
Eosinophils Absolute: 0 10*3/uL (ref 0.0–0.5)
Eosinophils Relative: 0 %
HCT: 29.8 % — ABNORMAL LOW (ref 36.0–46.0)
Hemoglobin: 9.7 g/dL — ABNORMAL LOW (ref 12.0–15.0)
Immature Granulocytes: 1 %
Lymphocytes Relative: 3 %
Lymphs Abs: 0.3 10*3/uL — ABNORMAL LOW (ref 0.7–4.0)
MCH: 29.1 pg (ref 26.0–34.0)
MCHC: 32.6 g/dL (ref 30.0–36.0)
MCV: 89.5 fL (ref 80.0–100.0)
Monocytes Absolute: 1.1 10*3/uL — ABNORMAL HIGH (ref 0.1–1.0)
Monocytes Relative: 11 %
Neutro Abs: 8 10*3/uL — ABNORMAL HIGH (ref 1.7–7.7)
Neutrophils Relative %: 85 %
Platelet Count: 245 10*3/uL (ref 150–400)
RBC: 3.33 MIL/uL — ABNORMAL LOW (ref 3.87–5.11)
RDW: 14.7 % (ref 11.5–15.5)
WBC Count: 9.5 10*3/uL (ref 4.0–10.5)
nRBC: 0 % (ref 0.0–0.2)

## 2021-10-02 LAB — CMP (CANCER CENTER ONLY)
ALT: 60 U/L — ABNORMAL HIGH (ref 0–44)
AST: 79 U/L — ABNORMAL HIGH (ref 15–41)
Albumin: 3.3 g/dL — ABNORMAL LOW (ref 3.5–5.0)
Alkaline Phosphatase: 105 U/L (ref 38–126)
Anion gap: 5 (ref 5–15)
BUN: 23 mg/dL (ref 8–23)
CO2: 32 mmol/L (ref 22–32)
Calcium: 10.4 mg/dL — ABNORMAL HIGH (ref 8.9–10.3)
Chloride: 102 mmol/L (ref 98–111)
Creatinine: 0.83 mg/dL (ref 0.44–1.00)
GFR, Estimated: >60 mL/min (ref >60)
Glucose, Bld: 90 mg/dL (ref 70–99)
Potassium: 4.2 mmol/L (ref 3.5–5.1)
Sodium: 139 mmol/L (ref 135–145)
Total Bilirubin: 0.3 mg/dL (ref 0.3–1.2)
Total Protein: 7.1 g/dL (ref 6.5–8.1)

## 2021-10-02 MED ORDER — IOHEXOL 300 MG/ML  SOLN
75.0000 mL | Freq: Once | INTRAMUSCULAR | Status: AC | PRN
  Administered 2021-10-02: 75 mL via INTRAVENOUS

## 2021-10-06 ENCOUNTER — Other Ambulatory Visit: Payer: Self-pay

## 2021-10-06 ENCOUNTER — Inpatient Hospital Stay: Payer: Medicare PPO | Admitting: Internal Medicine

## 2021-10-06 VITALS — BP 152/74 | HR 101 | Temp 97.3°F | Resp 17 | Wt 110.0 lb

## 2021-10-06 DIAGNOSIS — C3411 Malignant neoplasm of upper lobe, right bronchus or lung: Secondary | ICD-10-CM | POA: Diagnosis not present

## 2021-10-06 DIAGNOSIS — D649 Anemia, unspecified: Secondary | ICD-10-CM | POA: Diagnosis not present

## 2021-10-06 DIAGNOSIS — R634 Abnormal weight loss: Secondary | ICD-10-CM | POA: Diagnosis not present

## 2021-10-06 DIAGNOSIS — Z902 Acquired absence of lung [part of]: Secondary | ICD-10-CM | POA: Diagnosis not present

## 2021-10-06 DIAGNOSIS — C3490 Malignant neoplasm of unspecified part of unspecified bronchus or lung: Secondary | ICD-10-CM

## 2021-10-06 DIAGNOSIS — K3184 Gastroparesis: Secondary | ICD-10-CM

## 2021-10-06 DIAGNOSIS — N2889 Other specified disorders of kidney and ureter: Secondary | ICD-10-CM | POA: Diagnosis not present

## 2021-10-06 DIAGNOSIS — R6881 Early satiety: Secondary | ICD-10-CM | POA: Diagnosis not present

## 2021-10-06 DIAGNOSIS — K209 Esophagitis, unspecified without bleeding: Secondary | ICD-10-CM | POA: Diagnosis not present

## 2021-10-06 NOTE — Progress Notes (Signed)
[place] [phone]   [phone]  OFFICE PROGRESS NOTE  [name], MD [address] [place] [address]  PRINCIPAL DIAGNOSIS: recurrent small cell lung cancer initially diagnosed as Limited stage small cell lung cancer in June 2011, with recurrence in June 2015.   PRIOR THERAPY:  Status post 4 cycles of systemic chemotherapy with carboplatin and etoposide. Last dose was given 11/14/2009. This was concurrent with radiotherapy. Status post prophylactic cranial irradiation completed January 28, 2010. Status post wedge resection of the right upper lobe lung nodule under the care of Dr. Cyndia Bent on 09/21/2013 and the final pathology was consistent with small cell lung cancer. systemic chemotherapy with carboplatin for AUC of 5 on day 1 and etoposide 120 mg/M2 on days 1, 2 and 3 with Neulasta support on day 4 every 3 weeks. Status post 4 cycles. First dose on 10/30/2013.  CURRENT THERAPY: Observation.  INTERVAL HISTORY: Claudia [name] 77 y.o. female returns to the clinic today for follow-up visit accompanied by her husband.  The patient is feeling fine today with no concerning complaints except for fatigue and recent weight loss and early satiety.  She was seen by gastroenterology recently and diagnosed with dysmotility.  She had CT scan of the abdomen and pelvis on September 15, 2021 that showed development of 0.3 cm proximal pancreatic hypodensity and MRI with pancreatic protocol was recommended.  She also continues to have the indeterminate 2.6 cm left renal lesion.  The patient is a scheduled for MRCP soon.  She is followed by gastroenterologist in [facility].  She denied having any current nausea, vomiting, diarrhea or constipation.  She has no chest pain, shortness of breath, cough or hemoptysis.  She had repeat CT scan of the chest performed recently and she is here for evaluation and discussion of her scan results.  MEDICAL HISTORY: Past  Medical History:  Diagnosis Date   Anemia    Arthritis    Bradycardia    mild,may be due to beta blocker therapy   COPD (chronic obstructive pulmonary disease) (HCC)    GERD (gastroesophageal reflux disease)    otc   Hypertension 06/01/2019   pt denies   Local recurrence of lung cancer ([place]) dx'd 07/2013   rt thoracotomy chemo comp 12/2013   Lung cancer ([place]) 03/30/2008   Dr. Julien Nordmann, finished chemo, sp radiation, left upper    Lymphocytic colitis    Pneumonia     ALLERGIES:  is allergic to azithromycin and tussionex pennkinetic er [hydrocod poli-chlorphe poli er].  MEDICATIONS:  Current Outpatient Medications  Medication Sig Dispense Refill   Calcium Carbonate Antacid (TUMS PO) Take 500-1,000 mg by mouth 3 (three) times daily as needed (indigestion/heartburn.).     Cholecalciferol (VITAMIN D-3) 25 MCG (1000 UT) CAPS Take 1,000 Units by mouth daily.     Coenzyme Q10 100 MG TABS Take 100 mg by mouth daily.     fluticasone (FLONASE) 50 MCG/ACT nasal spray Place 1 spray into both nostrils daily as needed for allergies.     omeprazole (PRILOSEC) 40 MG capsule Take 1 capsule (40 mg total) by mouth daily. (Patient not taking: Reported on 09/25/2021) 90 capsule 3   Probiotic Product (PROBIOTIC DAILY PO) Take 1 capsule by mouth daily.     tretinoin (RETIN-A) 0.1 % cream Apply 1 application. topically at bedtime.     Turmeric Curcumin 500 MG CAPS Take 500 mg by mouth daily.     Wheat Dextrin (  BENEFIBER PO) Take 1 Dose by mouth daily. One spoonful in morning coffee     No current facility-administered medications for this visit.    SURGICAL HISTORY:  Past Surgical History:  Procedure Laterality Date   BRONCHOSCOPY  2011   DILATION AND CURETTAGE OF UTERUS     ESOPHAGOGASTRODUODENOSCOPY (EGD) WITH PROPOFOL N/A 07/29/2021   Procedure: ESOPHAGOGASTRODUODENOSCOPY (EGD) WITH PROPOFOL;  Surgeon: Harvel Quale, MD;  Location: [facility];  Service: Gastroenterology;  Laterality:  N/A;  1235 ASA 2   NECK SURGERY  1980's   THORACOTOMY Right 09/21/2013   Procedure: THORACOTOMY MAJOR;  Surgeon: Gaye Pollack, MD;  Location: [facility];  Service: Thoracic;  Laterality: Right;   UTERINE FIBROID SURGERY  2012   WEDGE RESECTION Right 09/21/2013   Procedure: RIGHT UPPER LOBE WEDGE RESECTION;  Surgeon: Gaye Pollack, MD;  Location: [facility];  Service: Thoracic;  Laterality: Right;    REVIEW OF SYSTEMS:  Constitutional: positive for anorexia, fatigue, and weight loss Eyes: negative Ears, nose, mouth, throat, and face: negative Respiratory: negative Cardiovascular: negative Gastrointestinal: positive for reflux symptoms and dysmotility issue Genitourinary:negative Integument/breast: negative Hematologic/lymphatic: negative Musculoskeletal:negative Neurological: negative Behavioral/Psych: negative Endocrine: negative Allergic/Immunologic: negative   PHYSICAL EXAMINATION: General appearance: alert, cooperative, fatigued, and no distress Head: Normocephalic, without obvious abnormality, atraumatic Neck: no adenopathy Lymph nodes: Cervical, supraclavicular, and axillary nodes normal. Resp: clear to auscultation bilaterally Back: symmetric, no curvature. ROM normal. No CVA tenderness. Cardio: regular rate and rhythm, S1, S2 normal, no murmur, click, rub or gallop GI: soft, non-tender; bowel sounds normal; no masses,  no organomegaly Extremities: extremities normal, atraumatic, no cyanosis or edema Neurologic: Alert and oriented X 3, normal strength and tone. Normal symmetric reflexes. Normal coordination and gait  ECOG PERFORMANCE STATUS: 1 - Symptomatic but completely ambulatory  Blood pressure (!) 152/74, pulse (!) 101, temperature (!) 97.3 F (36.3 C), temperature source Temporal, resp. rate 17, weight 110 lb (49.9 kg), SpO2 99 %.  LABORATORY DATA: Lab Results  Component Value Date   WBC 9.5 10/02/2021   HGB 9.7 (L) 10/02/2021   HCT 29.8 (L) 10/02/2021   MCV 89.5  10/02/2021   PLT 245 10/02/2021      Chemistry      Component Value Date/Time   NA 139 10/02/2021 1051   NA 141 11/02/2016 0919   K 4.2 10/02/2021 1051   K 4.2 11/02/2016 0919   CL 102 10/02/2021 1051   CL 103 08/03/2012 0842   CO2 32 10/02/2021 1051   CO2 29 11/02/2016 0919   BUN 23 10/02/2021 1051   BUN 13.9 11/02/2016 0919   CREATININE 0.83 10/02/2021 1051   CREATININE 1.0 11/02/2016 0919      Component Value Date/Time   CALCIUM 10.4 (H) 10/02/2021 1051   CALCIUM 10.9 (H) 11/02/2016 0919   ALKPHOS 105 10/02/2021 1051   ALKPHOS 78 11/02/2016 0919   AST 79 (H) 10/02/2021 1051   AST 19 11/02/2016 0919   ALT 60 (H) 10/02/2021 1051   ALT 12 11/02/2016 0919   BILITOT 0.3 10/02/2021 1051   BILITOT 0.47 11/02/2016 0919       RADIOGRAPHIC STUDIES: CT Chest W Contrast  Result Date: 10/02/2021 CLINICAL DATA:  Non-small-cell lung cancer. Restaging. * Tracking Code: BO * EXAM: CT CHEST WITH CONTRAST TECHNIQUE: Multidetector CT imaging of the chest was performed during intravenous contrast administration. RADIATION DOSE REDUCTION: This exam was performed according to the departmental dose-optimization program which includes automated exposure control, adjustment of the  mA and/or kV according to patient size and/or use of iterative reconstruction technique. CONTRAST:  92mL OMNIPAQUE IOHEXOL 300 MG/ML  SOLN COMPARISON:  04/02/2021 FINDINGS: Cardiovascular: The heart size is normal. No substantial pericardial effusion. Coronary artery calcification is evident. Mild atherosclerotic calcification is noted in the wall of the thoracic aorta. Mediastinum/Nodes: No mediastinal lymphadenopathy. Esophagus is patulous with food/fluid debris in the lumen, similar to prior and compatible with dysmotility or reflux. There is no axillary lymphadenopathy. Lungs/Pleura: Centrilobular and paraseptal emphysema evident. Volume loss left hemithorax is stable in the interval. Left parahilar architectural  distortion and scarring is stable consistent with treatment related fibrosis. Stable 4 mm right lower lobe pulmonary nodule on 95/5. Cluster of tiny nodules in the anterior right upper lobe (images 52-56 of series 5) are stable, likely infectious/inflammatory. No focal airspace consolidation. Small left pleural effusion is progressive in the interval. Upper Abdomen: Stable cyst upper pole left kidney. 10 mm exophytic lesion upper pole right kidney (156/2) has attenuation too high to be a simple cyst but is decreased in size since an exam of 05/17/2017 suggesting involuting benign cyst. Musculoskeletal: No worrisome lytic or sclerotic osseous abnormality. IMPRESSION: 1. Interval increase in size of the small left pleural effusion. Otherwise stable exam. Post treatment changes in the parahilar left lung are stable. 2. 5 mm right lower lobe pulmonary nodule is stable and clustered nodularity in the right upper lobe is unchanged, compatible sequelae of infectious/inflammatory etiology. 3. Similar patulous esophagus with fluid and food debris in the lumen. Imaging features may reflect dysmotility or reflux. 4.  Emphysema (ICD10-J43.9) and Aortic Atherosclerosis (ICD10-170.0) Electronically Signed   By: Misty Stanley M.D.   On: 10/02/2021 13:52   CT Abdomen Pelvis W Contrast  Result Date: 09/15/2021 CLINICAL DATA:  Nausea/vomiting recurrent vomiting rule out gastroparesis. Hiccups and vomiting while eating x 2 months with 15lb weight loss. History of reoccurring small cell lung ca 2015 EXAM: CT ABDOMEN AND PELVIS WITH CONTRAST TECHNIQUE: Multidetector CT imaging of the abdomen and pelvis was performed using the standard protocol following bolus administration of intravenous contrast. RADIATION DOSE REDUCTION: This exam was performed according to the departmental dose-optimization program which includes automated exposure control, adjustment of the mA and/or kV according to patient size and/or use of iterative  reconstruction technique. CONTRAST:  4mL OMNIPAQUE IOHEXOL 300 MG/ML  SOLN COMPARISON:  CT abdomen pelvis 11/03/2010, CT chest 04/02/2021, CT abdomen pelvis 11/03/2010 FINDINGS: Lower chest: Interval increase of a small volume left pleural effusion. Partially visualized left pericardial loculation pleural fluid. Stable 5 mm right lower lobe pulmonary nodule. Hepatobiliary: No focal liver abnormality. No gallstones, gallbladder wall thickening, or pericholecystic fluid. No biliary dilatation. Pancreas: Interval development of 0.3 cm proximal pancreatic hypodensity (2:31). Normal pancreatic contour. No surrounding inflammatory changes. No main pancreatic ductal dilatation. Spleen: Normal in size without focal abnormality. Adrenals/Urinary Tract: No adrenal nodule bilaterally. Bilateral kidneys enhance symmetrically. There is a 2.6 cm hypodense lesion within left kidney with a density of 26 Hounsfield units. Several other lesions demonstrate fluid density likely represent simple renal cysts. Simple renal cysts, in the absence of clinically indicated signs/symptoms, require no independent follow-up. Subcentimeter hypodensities are too small to characterize No hydronephrosis. No hydroureter. The urinary bladder is unremarkable. On delayed imaging, there is no urothelial wall thickening and there are no filling defects in the opacified portions of the bilateral collecting systems or ureters. Stomach/Bowel: Stomach is within normal limits. No evidence of bowel wall thickening or dilatation. Appendix appears normal. Vascular/Lymphatic: No  abdominal aorta or iliac aneurysm. Severe atherosclerotic plaque of the aorta and its branches. No abdominal, pelvic, or inguinal lymphadenopathy. Reproductive: Query thickened hypodense endometrium (7:63). The uterus and bilateral adnexal regions are otherwise unremarkable. Other: No intraperitoneal free fluid. No intraperitoneal free gas. No organized fluid collection. Musculoskeletal:  No abdominal wall hernia or abnormality. No suspicious lytic or blastic osseous lesions. No acute displaced fracture. Multilevel degenerative changes of the spine with intervertebral disc space vacuum phenomenon. Mild retrolisthesis of L1 on L2 and L2 on L3. Grade 1 anterolisthesis of L5 on S1. IMPRESSION: 1. Interval increase of a small volume left pleural effusion. 2. Interval development of 0.3 cm proximal pancreatic hypodensity. Recommend MRI pancreatic protocol further evaluation. 3. Indeterminate 2.6 cm left renal lesion. This can further be evaluated on the MRI. 4. Query thickened endometrium versus uterine fibroid. Recommend correlation with pelvic ultrasound. 5.  Aortic Atherosclerosis (ICD10-I70.0). Electronically Signed   By: Iven Finn M.D.   On: 09/15/2021 20:37    ASSESSMENT AND PLAN:  This is a very pleasant 76 years old white female with recurrent small cell lung cancer that was initially diagnosed in June 2011. She underwent systemic chemotherapy was carboplatin and etoposide concurrent with radiation and followed by prophylactic cranial irradiation. The patient was on observation since November 2011 that she had recurrence with right upper lobe nodule in June 2015 status post wedge resection that was consistent with recurrent small cell lung cancer. She was then treated with systemic chemotherapy again with carboplatin and etoposide for 4 cycles. The patient is currently on observation and she is feeling fine with no concerning complaints except for intermittent hiccups probably secondary to the reported esophagitis and dysmotility of the esophagus. The patient had repeat CT scan of the chest performed recently.  I personally and independently reviewed the scan images and discussed the results with the patient and her husband. Her scan showed no concerning findings for disease progression but there was a slight increase in the size of the small left pleural effusion. I recommended for  the patient to continue on observation with repeat CT scan of the chest in 6 months. For the dysmotility issue and a small pancreatic lesion, she is followed by gastroenterology in [facility] and she is expected to have MRCP in few weeks. For the anemia, I gave the patient a stool cards to rule out any Hemoccult in her stool. For the suspicious renal mass she is followed by urology. The patient was advised to call immediately if she has any other concerning symptoms in the interval.  All questions were answered. The patient knows to call the clinic with any problems, questions or concerns. We can certainly see the patient much sooner if necessary.  Disclaimer: This note was dictated with voice recognition software. Similar sounding words can inadvertently be transcribed and may not be corrected upon review.

## 2021-10-07 ENCOUNTER — Emergency Department (HOSPITAL_COMMUNITY): Payer: Medicare PPO

## 2021-10-07 ENCOUNTER — Other Ambulatory Visit: Payer: Self-pay

## 2021-10-07 ENCOUNTER — Telehealth (INDEPENDENT_AMBULATORY_CARE_PROVIDER_SITE_OTHER): Payer: Self-pay

## 2021-10-07 ENCOUNTER — Observation Stay (HOSPITAL_COMMUNITY)
Admission: EM | Admit: 2021-10-07 | Discharge: 2021-10-09 | Disposition: A | Discharge status: Home / Self Care | Payer: Medicare PPO | Attending: Family Medicine | Admitting: Family Medicine

## 2021-10-07 ENCOUNTER — Encounter (HOSPITAL_COMMUNITY): Payer: Self-pay | Admitting: Emergency Medicine

## 2021-10-07 DIAGNOSIS — J90 Pleural effusion, not elsewhere classified: Secondary | ICD-10-CM | POA: Diagnosis not present

## 2021-10-07 DIAGNOSIS — C3492 Malignant neoplasm of unspecified part of left bronchus or lung: Secondary | ICD-10-CM | POA: Diagnosis not present

## 2021-10-07 DIAGNOSIS — K219 Gastro-esophageal reflux disease without esophagitis: Secondary | ICD-10-CM | POA: Diagnosis present

## 2021-10-07 DIAGNOSIS — I4891 Unspecified atrial fibrillation: Principal | ICD-10-CM | POA: Diagnosis present

## 2021-10-07 DIAGNOSIS — I10 Essential (primary) hypertension: Secondary | ICD-10-CM | POA: Diagnosis not present

## 2021-10-07 DIAGNOSIS — C3490 Malignant neoplasm of unspecified part of unspecified bronchus or lung: Secondary | ICD-10-CM | POA: Diagnosis not present

## 2021-10-07 DIAGNOSIS — N2889 Other specified disorders of kidney and ureter: Secondary | ICD-10-CM | POA: Diagnosis not present

## 2021-10-07 DIAGNOSIS — Z87891 Personal history of nicotine dependence: Secondary | ICD-10-CM | POA: Insufficient documentation

## 2021-10-07 DIAGNOSIS — Z79899 Other long term (current) drug therapy: Secondary | ICD-10-CM | POA: Diagnosis not present

## 2021-10-07 DIAGNOSIS — R0789 Other chest pain: Secondary | ICD-10-CM | POA: Diagnosis not present

## 2021-10-07 DIAGNOSIS — I3139 Other pericardial effusion (noninflammatory): Secondary | ICD-10-CM | POA: Insufficient documentation

## 2021-10-07 DIAGNOSIS — R079 Chest pain, unspecified: Secondary | ICD-10-CM | POA: Diagnosis not present

## 2021-10-07 DIAGNOSIS — J449 Chronic obstructive pulmonary disease, unspecified: Secondary | ICD-10-CM | POA: Diagnosis not present

## 2021-10-07 DIAGNOSIS — D649 Anemia, unspecified: Secondary | ICD-10-CM | POA: Diagnosis present

## 2021-10-07 DIAGNOSIS — R0602 Shortness of breath: Secondary | ICD-10-CM | POA: Diagnosis not present

## 2021-10-07 LAB — COMPREHENSIVE METABOLIC PANEL
ALT: 27 U/L (ref 0–44)
AST: 16 U/L (ref 15–41)
Albumin: 3.2 g/dL — ABNORMAL LOW (ref 3.5–5.0)
Alkaline Phosphatase: 101 U/L (ref 38–126)
Anion gap: 6 (ref 5–15)
BUN: 19 mg/dL (ref 8–23)
CO2: 31 mmol/L (ref 22–32)
Calcium: 9.7 mg/dL (ref 8.9–10.3)
Chloride: 101 mmol/L (ref 98–111)
Creatinine, Ser: 0.91 mg/dL (ref 0.44–1.00)
GFR, Estimated: >60 mL/min (ref >60)
Glucose, Bld: 135 mg/dL — ABNORMAL HIGH (ref 70–99)
Potassium: 3.6 mmol/L (ref 3.5–5.1)
Sodium: 138 mmol/L (ref 135–145)
Total Bilirubin: 0.8 mg/dL (ref 0.3–1.2)
Total Protein: 7.3 g/dL (ref 6.5–8.1)

## 2021-10-07 LAB — CBC
HCT: 34.4 % — ABNORMAL LOW (ref 36.0–46.0)
Hemoglobin: 11.1 g/dL — ABNORMAL LOW (ref 12.0–15.0)
MCH: 29.4 pg (ref 26.0–34.0)
MCHC: 32.3 g/dL (ref 30.0–36.0)
MCV: 91 fL (ref 80.0–100.0)
Platelets: 292 10*3/uL (ref 150–400)
RBC: 3.78 MIL/uL — ABNORMAL LOW (ref 3.87–5.11)
RDW: 15.9 % — ABNORMAL HIGH (ref 11.5–15.5)
WBC: 16.3 10*3/uL — ABNORMAL HIGH (ref 4.0–10.5)
nRBC: 0 % (ref 0.0–0.2)

## 2021-10-07 LAB — TROPONIN I (HIGH SENSITIVITY)
Troponin I (High Sensitivity): 5 ng/L (ref <18)
Troponin I (High Sensitivity): 4 ng/L (ref <18)

## 2021-10-07 LAB — TSH: TSH: 1.305 u[IU]/mL (ref 0.350–4.500)

## 2021-10-07 LAB — HEPARIN LEVEL (UNFRACTIONATED): Heparin Unfractionated: <0.1 IU/mL — ABNORMAL LOW (ref 0.30–0.70)

## 2021-10-07 LAB — MAGNESIUM: Magnesium: 1.7 mg/dL (ref 1.7–2.4)

## 2021-10-07 MED ORDER — DILTIAZEM HCL 25 MG/5ML IV SOLN
10.0000 mg | Freq: Once | INTRAVENOUS | Status: AC
  Administered 2021-10-07: 10 mg via INTRAVENOUS
  Filled 2021-10-07: qty 5

## 2021-10-07 MED ORDER — IOHEXOL 350 MG/ML SOLN
100.0000 mL | Freq: Once | INTRAVENOUS | Status: AC | PRN
Start: 2021-10-07 — End: 2021-10-07
  Administered 2021-10-07: 80 mL via INTRAVENOUS

## 2021-10-07 MED ORDER — SODIUM CHLORIDE 0.9% FLUSH
3.0000 mL | Freq: Two times a day (BID) | INTRAVENOUS | Status: DC
Start: 2021-10-07 — End: 2021-10-09
  Administered 2021-10-07 – 2021-10-08 (×4): 3 mL via INTRAVENOUS

## 2021-10-07 MED ORDER — ONDANSETRON HCL 4 MG PO TABS: 4.0000 mg | ORAL_TABLET | Freq: Four times a day (QID) | ORAL | Status: DC | PRN

## 2021-10-07 MED ORDER — ACETAMINOPHEN 325 MG PO TABS: 650.0000 mg | ORAL_TABLET | Freq: Four times a day (QID) | ORAL | Status: DC | PRN

## 2021-10-07 MED ORDER — DILTIAZEM HCL-DEXTROSE 125-5 MG/125ML-% IV SOLN (PREMIX)
5.0000 mg/h | INTRAVENOUS | Status: DC
  Administered 2021-10-07: 5 mg/h via INTRAVENOUS
  Filled 2021-10-07: qty 125

## 2021-10-07 MED ORDER — ENOXAPARIN SODIUM 60 MG/0.6ML IJ SOSY
50.0000 mg | PREFILLED_SYRINGE | Freq: Two times a day (BID) | INTRAMUSCULAR | Status: DC
  Administered 2021-10-07 – 2021-10-08 (×2): 50 mg via SUBCUTANEOUS
  Filled 2021-10-07 (×2): qty 0.6

## 2021-10-07 MED ORDER — ONDANSETRON HCL 4 MG/2ML IJ SOLN: 4.0000 mg | Freq: Four times a day (QID) | INTRAMUSCULAR | Status: DC | PRN

## 2021-10-07 MED ORDER — HEPARIN BOLUS VIA INFUSION
2000.0000 [IU] | Freq: Once | INTRAVENOUS | Status: AC
Start: 2021-10-07 — End: 2021-10-07
  Administered 2021-10-07: 2000 [IU] via INTRAVENOUS

## 2021-10-07 MED ORDER — SODIUM CHLORIDE 0.9 % IV BOLUS
250.0000 mL | Freq: Once | INTRAVENOUS | Status: AC
Start: 2021-10-07 — End: 2021-10-07
  Administered 2021-10-07: 250 mL via INTRAVENOUS

## 2021-10-07 MED ORDER — DRONEDARONE HCL 400 MG PO TABS
400.0000 mg | ORAL_TABLET | Freq: Two times a day (BID) | ORAL | Status: DC
  Administered 2021-10-07 – 2021-10-09 (×4): 400 mg via ORAL
  Filled 2021-10-07 (×7): qty 1

## 2021-10-07 MED ORDER — ACETAMINOPHEN 650 MG RE SUPP: 650.0000 mg | Freq: Four times a day (QID) | RECTAL | Status: DC | PRN

## 2021-10-07 MED ORDER — LACTATED RINGERS IV SOLN: INTRAVENOUS | Status: DC

## 2021-10-07 MED ORDER — PANTOPRAZOLE SODIUM 40 MG PO TBEC
40.0000 mg | DELAYED_RELEASE_TABLET | Freq: Every day | ORAL | Status: DC
  Administered 2021-10-07 – 2021-10-09 (×3): 40 mg via ORAL
  Filled 2021-10-07 (×3): qty 1

## 2021-10-07 MED ORDER — SODIUM CHLORIDE 0.9 % IV BOLUS
500.0000 mL | Freq: Once | INTRAVENOUS | Status: AC
  Administered 2021-10-07: 500 mL via INTRAVENOUS

## 2021-10-07 MED ORDER — CHLORHEXIDINE GLUCONATE CLOTH 2 % EX PADS
6.0000 | MEDICATED_PAD | Freq: Every day | CUTANEOUS | Status: DC
  Administered 2021-10-07 – 2021-10-08 (×2): 6 via TOPICAL

## 2021-10-07 MED ORDER — FENTANYL CITRATE PF 50 MCG/ML IJ SOSY
50.0000 ug | PREFILLED_SYRINGE | Freq: Once | INTRAMUSCULAR | Status: AC
  Administered 2021-10-07: 50 ug via INTRAVENOUS
  Filled 2021-10-07: qty 1

## 2021-10-07 MED ORDER — ONDANSETRON HCL 4 MG/2ML IJ SOLN
4.0000 mg | Freq: Once | INTRAMUSCULAR | Status: AC
  Administered 2021-10-07: 4 mg via INTRAVENOUS
  Filled 2021-10-07: qty 2

## 2021-10-07 MED ORDER — HEPARIN BOLUS VIA INFUSION: 1500.0000 [IU] | Freq: Once | INTRAVENOUS | Status: DC

## 2021-10-07 MED ORDER — SODIUM CHLORIDE 0.9 % IV BOLUS
500.0000 mL | Freq: Once | INTRAVENOUS | Status: AC
Start: 2021-10-07 — End: 2021-10-07
  Administered 2021-10-07: 500 mL via INTRAVENOUS

## 2021-10-07 MED ORDER — HEPARIN (PORCINE) 25000 UT/250ML-% IV SOLN
1000.0000 [IU]/h | INTRAVENOUS | Status: DC
  Administered 2021-10-07: 800 [IU]/h via INTRAVENOUS
  Filled 2021-10-07: qty 250

## 2021-10-07 MED ORDER — SODIUM CHLORIDE 0.9 % IV SOLN: 250.0000 mL | INTRAVENOUS | Status: DC | PRN

## 2021-10-07 MED ORDER — SODIUM CHLORIDE 0.9% FLUSH: 3.0000 mL | INTRAVENOUS | Status: DC | PRN

## 2021-10-07 MED ORDER — FLUTICASONE PROPIONATE 50 MCG/ACT NA SUSP
1.0000 | Freq: Every day | NASAL | Status: DC | PRN
Start: 2021-10-07 — End: 2021-10-09

## 2021-10-07 NOTE — Assessment & Plan Note (Signed)
-   Allergies/no acute exacerbation seen currently -Continue as needed bronchodilators.

## 2021-10-07 NOTE — Progress Notes (Addendum)
ANTICOAGULATION CONSULT NOTE -   Pharmacy Consult for lovenox Indication: afib  Allergies  Allergen Reactions   Azithromycin Shortness Of Breath and Rash    Took Tussionex at same time Jan 2013.   Tussionex Pennkinetic Er [Hydrocod Poli-Chlorphe Poli Er] Shortness Of Breath and Rash    Took Azithromycin at same time Jan 2013    Patient Measurements: Height: 5\' 2"  (157.5 cm) Weight: 49.3 kg (108 lb 11 oz) IBW/kg (Calculated) : 50.1  Vital Signs: Temp: 98.1 F (36.7 C) (07/11 1203) Temp Source: Oral (07/11 1203) BP: 76/41 (07/11 1426) Pulse Rate: 73 (07/11 1426)  Labs: Recent Labs    10/07/21 0546 10/07/21 0811 10/07/21 1440  HGB 11.1*  --   --   HCT 34.4*  --   --   PLT 292  --   --   HEPARINUNFRC  --   --  <0.10*  CREATININE 0.91  --   --   TROPONINIHS 5 4  --      Estimated Creatinine Clearance: 41.6 mL/min (by C-G formula based on SCr of 0.91 mg/dL).   Medical History: Past Medical History:  Diagnosis Date   Anemia    Arthritis    Bradycardia    mild,may be due to beta blocker therapy   COPD (chronic obstructive pulmonary disease) (HCC)    GERD (gastroesophageal reflux disease)    otc   Hypertension 06/01/2019   pt denies   Local recurrence of lung cancer ([place]) dx'd 07/2013   rt thoracotomy chemo comp 12/2013   Lung cancer ([place]) 03/30/2008   Dr. Julien Nordmann, finished chemo, sp radiation, left upper    Lymphocytic colitis    Pneumonia     Assessment: 76yo female with lung cancer c/o CP that worsens with deep inspirations, concern for PE >> to begin heparin. 7/11  CTA No evidence of pulmonary embolism; however, patient is in afib. D/W Dr. Dyann Kief and will switch patient to lovenox for afib  Goal of Therapy:  Monitor platelets by anticoagulation protocol: Yes   Plan:  D/C heparin Lovenox 1mg /kg(50mg ) sq q12h CBC daily F/U transition to po tx  Isac Sarna, BS Pharm D, BCPS Clinical Pharmacist 10/07/2021,3:08 PM

## 2021-10-07 NOTE — Assessment & Plan Note (Addendum)
-   Patient started on Cardizem drip with subsequent discontinuation in the setting of hypotension. -Admitted to stepdown  -TSH ordered and demonstrating normal thyroid function  -Given prior history of bradycardia beta-blockers will be avoided -Continue holding Cardizem at this point as patient has transition back to sinus rhythm and experienced hypotension. -After discussing with cardiology service decision made to start patient on Multaq  -Continue Lovenox for anticoagulation purposes. -Follow-up 2D echo.

## 2021-10-07 NOTE — ED Provider Notes (Signed)
[facility] Emergency Department Provider Note MRN:  [id number]  Arrival date & time: 10/07/21     Chief Complaint   Chest Pain   History of Present Illness   Claudia [name] is a 76 y.o. year-old female with a history of lung cancer presenting to the ED with chief complaint of chest pain.  Sudden onset chest pain 3 to 4 hours ago, worse with deep breaths.  Described as sharp.  Pain is severe.  Review of Systems  A thorough review of systems was obtained and all systems are negative except as noted in the HPI and PMH.   Patient's Health History    Past Medical History:  Diagnosis Date   Anemia    Arthritis    Bradycardia    mild,may be due to beta blocker therapy   COPD (chronic obstructive pulmonary disease) (HCC)    GERD (gastroesophageal reflux disease)    otc   Hypertension 06/01/2019   pt denies   Local recurrence of lung cancer ([place]) dx'd 07/2013   rt thoracotomy chemo comp 12/2013   Lung cancer ([place]) 03/30/2008   Dr. Julien Nordmann, finished chemo, sp radiation, left upper    Lymphocytic colitis    Pneumonia     Past Surgical History:  Procedure Laterality Date   BRONCHOSCOPY  2011   DILATION AND CURETTAGE OF UTERUS     ESOPHAGOGASTRODUODENOSCOPY (EGD) WITH PROPOFOL N/A 07/29/2021   Procedure: ESOPHAGOGASTRODUODENOSCOPY (EGD) WITH PROPOFOL;  Surgeon: Harvel Quale, MD;  [facility];  Service: Gastroenterology;  Laterality: N/A;  1235 ASA 2   NECK SURGERY  1980's   THORACOTOMY Right 09/21/2013   Procedure: THORACOTOMY MAJOR;  Surgeon: Gaye Pollack, MD;  Location: [facility] OR;  Service: Thoracic;  Laterality: Right;   UTERINE FIBROID SURGERY  2012   WEDGE RESECTION Right 09/21/2013   Procedure: RIGHT UPPER LOBE WEDGE RESECTION;  Surgeon: Gaye Pollack, MD;  Location: [facility] OR;  Service: Thoracic;  Laterality: Right;    Family History  Problem Relation Age of Onset   Brain cancer Mother        brain cancer   Emphysema Father     Diabetes Son    Heart disease Paternal Grandfather    Breast cancer Maternal Aunt     Social History   Socioeconomic History   Marital status: Married    Spouse name: Not on file   Number of children: Not on file   Years of education: Not on file   Highest education level: Not on file  Occupational History   Occupation: clerical    Employer: [employer]  Tobacco Use   Smoking status: Former    Packs/day: 1.00    Years: 35.00    Total pack years: 35.00    Types: Cigarettes    Quit date: 03/31/2007    Years since quitting: 14.5    Passive exposure: Past   Smokeless tobacco: Never  Vaping Use   Vaping Use: Never used  Substance and Sexual Activity   Alcohol use: Yes    Comment: occassional about 3 drinks per year   Drug use: No   Sexual activity: Never  Other Topics Concern   Not on file  Social History Narrative   Not on file   Social Determinants of Health   Financial Resource Strain: Not on file  Food Insecurity: Not on file  Transportation Needs: Not on file  Physical Activity: Not on file  Stress: Not on file  Social  Connections: Not on file  Intimate Partner Violence: Not on file     Physical Exam   Vitals:   10/07/21 0530 10/07/21 0600  BP: (!) 166/87 (!) 143/84  Pulse:  (!) 120  Resp: 20 (!) 29  Temp:    SpO2: 97% 100%    CONSTITUTIONAL: Chronically ill-appearing, in moderate distress due to pain NEURO/PSYCH:  Alert and oriented x 3, no focal deficits EYES:  eyes equal and reactive ENT/NECK:  no LAD, no JVD CARDIO: Regular rate, well-perfused, normal S1 and S2 PULM:  CTAB no wheezing or rhonchi GI/GU:  non-distended, non-tender MSK/SPINE:  No gross deformities, no edema SKIN:  no rash, atraumatic   *Additional and/or pertinent findings included in MDM below  Diagnostic and Interventional Summary    EKG Interpretation  Date/Time:  Tuesday October 07 2021 04:47:08 EDT Ventricular Rate:  106 PR Interval:  143 QRS Duration: 94 QT  Interval:  314 QTC Calculation: 417 R Axis:   100 Text Interpretation: Sinus tachycardia Consider right atrial enlargement Probable lateral infarct, age indeterminate Confirmed by Gerlene Fee [phone]) on 10/07/2021 5:21:28 AM       Labs Reviewed  CBC - Abnormal; Notable for the following components:      Result Value   WBC 16.3 (*)    RBC 3.78 (*)    Hemoglobin 11.1 (*)    HCT 34.4 (*)    RDW 15.9 (*)    All other components within normal limits  COMPREHENSIVE METABOLIC PANEL - Abnormal; Notable for the following components:   Glucose, Bld 135 (*)    Albumin 3.2 (*)    All other components within normal limits  HEPARIN LEVEL (UNFRACTIONATED)  TROPONIN I (HIGH SENSITIVITY)  TROPONIN I (HIGH SENSITIVITY)    DG Chest Port 1 View  Final Result    CT Angio Chest Pulmonary Embolism (PE) W or WO Contrast    (Results Pending)    Medications  diltiazem (CARDIZEM) 125 mg in dextrose 5% 125 mL (1 mg/mL) infusion (5 mg/hr Intravenous New Bag/Given 10/07/21 0628)  heparin ADULT infusion 100 units/mL (25000 units/270mL) (800 Units/hr Intravenous New Bag/Given 10/07/21 0706)  sodium chloride 0.9 % bolus 500 mL (0 mLs Intravenous Stopped 10/07/21 0629)  ondansetron (ZOFRAN) injection 4 mg (4 mg Intravenous Given 10/07/21 0547)  fentaNYL (SUBLIMAZE) injection 50 mcg (50 mcg Intravenous Given 10/07/21 0546)  diltiazem (CARDIZEM) injection 10 mg (10 mg Intravenous Given 10/07/21 0547)  fentaNYL (SUBLIMAZE) injection 50 mcg (50 mcg Intravenous Given 10/07/21 0642)  iohexol (OMNIPAQUE) 350 MG/ML injection 100 mL (100 mLs Intravenous Contrast Given 10/07/21 0721)  heparin bolus via infusion 2,000 Units (2,000 Units Intravenous Bolus from Bag 10/07/21 0707)     Procedures  /  Critical Care .Critical Care  Performed by: Maudie Flakes, MD Authorized by: Maudie Flakes, MD   Critical care provider statement:    Critical care time (minutes):  35   Critical care was necessary to treat or prevent  imminent or life-threatening deterioration of the following conditions: A-fib with RVR.   Critical care was time spent personally by me on the following activities:  Development of treatment plan with patient or surrogate, discussions with consultants, evaluation of patient's response to treatment, examination of patient, ordering and review of laboratory studies, ordering and review of radiographic studies, ordering and performing treatments and interventions, pulse oximetry, re-evaluation of patient's condition and review of old charts   ED Course and Medical Decision Making  Initial Impression and Ddx Pleuritic chest pain  radiation to the back, has lung cancer.  On my initial assessment patient is in A-fib with RVR, rates in the 140s.  Suspicious for pulmonary embolism, also considering symptomatic A-fib, ACS, dissection.  Will provide pain control, rate control, obtain labs, CT.  Past medical/surgical history that increases complexity of ED encounter: Lung cancer  Interpretation of Diagnostics I personally reviewed the EKG and my interpretation is as follows: A-fib with RVR  Labs overall reassuring with no significant blood count or electrolyte disturbance.  Awaiting CT.  Patient Reassessment and Ultimate Disposition/Management     Patient is on a dill drip, empirically receiving heparin given high concern for PE, anticipating admission.  Intermittent palpitations for several days, not a good cardioversion candidate.  Signed out to oncoming provider.  Patient management required discussion with the following services or consulting groups:  None  Complexity of Problems Addressed Acute illness or injury that poses threat of life of bodily function  Additional Data Reviewed and Analyzed Further history obtained from: Further history from spouse/family member  Additional Factors Impacting ED Encounter Risk Use of parenteral controlled substances and Consideration of  hospitalization  Barth Kirks. [place] Small, [place] [email or web address]  Final Clinical Impressions(s) / ED Diagnoses     ICD-10-CM   1. Chest pain, unspecified type  R07.9     2. Atrial fibrillation with rapid ventricular response (HCC)  I48.91       ED Discharge Orders     None        Discharge Instructions Discussed with and Provided to Patient:   Discharge Instructions   None      Maudie Flakes, MD 10/07/21 [phone]

## 2021-10-07 NOTE — ED Notes (Signed)
Pt o2 decreased to 87% room air prior to fentanyl admin. Pt placed on 3l [place]. Saturations increased to upper 90s

## 2021-10-07 NOTE — Plan of Care (Signed)
  Problem: Health Behavior/Discharge Planning: Goal: Ability to manage health-related needs will improve Outcome: Progressing   Problem: Education: Goal: Knowledge of General Education information will improve Description: Including pain rating scale, medication(s)/side effects and non-pharmacologic comfort measures Outcome: Progressing   Problem: Clinical Measurements: Goal: Ability to maintain clinical measurements within normal limits will improve Outcome: Progressing Goal: Will remain free from infection Outcome: Progressing Goal: Diagnostic test results will improve Outcome: Progressing Goal: Respiratory complications will improve Outcome: Progressing Goal: Cardiovascular complication will be avoided Outcome: Progressing   Problem: Activity: Goal: Risk for activity intolerance will decrease Outcome: Progressing   Problem: Coping: Goal: Level of anxiety will decrease Outcome: Progressing   Problem: Nutrition: Goal: Adequate nutrition will be maintained Outcome: Progressing   Problem: Elimination: Goal: Will not experience complications related to bowel motility Outcome: Progressing Goal: Will not experience complications related to urinary retention Outcome: Progressing   Problem: Pain Managment: Goal: General experience of comfort will improve Outcome: Progressing   Problem: Safety: Goal: Ability to remain free from injury will improve Outcome: Progressing

## 2021-10-07 NOTE — ED Triage Notes (Signed)
Pt c/o pain when she breathes in her chest.

## 2021-10-07 NOTE — ED Notes (Signed)
Bp has been low the last 2 cycles, last reading was 83/55--MD, Dyann Kief, made aware

## 2021-10-07 NOTE — Assessment & Plan Note (Addendum)
-  Atypical in presentation -multifactorial (pericardial effusion, esophageal dysmotility, lung fibrosis, also arrhythmia -Chest pain has resolved  -Negative troponin -2D echo will be done to assess for structure and wall motion abnormalities. - 2D echo showed normal LVF with moderate posterior pericardial effusion  - CRP and ESR elevated at 108, -start Colchicine 0.6mg  BID

## 2021-10-07 NOTE — H&P (Signed)
History and Physical    Patient: Claudia [name] ERD:[id number] DOB: Jul 22, 1945 DOA: 10/07/2021 DOS: the patient was seen and examined on 10/07/2021 PCP: [name], MD  Patient coming from: Home  Chief Complaint:  Chief Complaint  Patient presents with   Chest Pain   HPI: Claudia [name] is a 76 y.o. female with medical history significant of small cell lung cancer, gastroesophageal reflux disease, COPD, hypertension and dyslipidemia; who presented to the hospital secondary to shortness of breath and chest discomfort.  Patient reports chest pain symptom presented suddenly, worsening with deep breath, sharp in nature with associated shortness of breath.  No fever, no nausea, no vomiting.  Patient reports no dysuria, abdominal pain, focal neurologic deficits, sick contacts or any other complaints. At time of admission work-up demonstrated A-fib with RVR requiring initiation of insulin drip and IV heparin.  CT angiogram of the chest rule out pulmonary embolism; patient with negative troponin x2 no frank ischemic changes on EKG demonstrating atrial fibrillation.Marland Kitchen  [facility] was contacted to place in the hospital for further evaluation and management.  Cardiology service consulted.  Review of Systems: As mentioned in the history of present illness. All other systems reviewed and are negative. Past Medical History:  Diagnosis Date   Anemia    Arthritis    Bradycardia    mild,may be due to beta blocker therapy   COPD (chronic obstructive pulmonary disease) (HCC)    GERD (gastroesophageal reflux disease)    otc   Hypertension 06/01/2019   pt denies   Local recurrence of lung cancer ([place]) dx'd 07/2013   rt thoracotomy chemo comp 12/2013   Lung cancer ([place]) 03/30/2008   Dr. Julien Nordmann, finished chemo, sp radiation, left upper    Lymphocytic colitis    Pneumonia    Past Surgical History:  Procedure Laterality Date   BRONCHOSCOPY  2011   DILATION AND CURETTAGE OF UTERUS      ESOPHAGOGASTRODUODENOSCOPY (EGD) WITH PROPOFOL N/A 07/29/2021   Procedure: ESOPHAGOGASTRODUODENOSCOPY (EGD) WITH PROPOFOL;  Surgeon: Harvel Quale, MD;  [facility];  Service: Gastroenterology;  Laterality: N/A;  1235 ASA 2   NECK SURGERY  1980's   THORACOTOMY Right 09/21/2013   Procedure: THORACOTOMY MAJOR;  Surgeon: Gaye Pollack, MD;  Location: [place] OR;  Service: Thoracic;  Laterality: Right;   UTERINE FIBROID SURGERY  2012   WEDGE RESECTION Right 09/21/2013   Procedure: RIGHT UPPER LOBE WEDGE RESECTION;  Surgeon: Gaye Pollack, MD;  Location: [place] OR;  Service: Thoracic;  Laterality: Right;   Social History:  reports that she quit smoking about 14 years ago. Her smoking use included cigarettes. She has a 35.00 pack-year smoking history. She has been exposed to tobacco smoke. She has never used smokeless tobacco. She reports current alcohol use. She reports that she does not use drugs.  Allergies  Allergen Reactions   Azithromycin Shortness Of Breath and Rash    Took Tussionex at same time Jan 2013.   Tussionex Pennkinetic Er [Hydrocod Poli-Chlorphe Poli Er] Shortness Of Breath and Rash    Took Azithromycin at same time Jan 2013    Family History  Problem Relation Age of Onset   Brain cancer Mother        brain cancer   Emphysema Father    Diabetes Son    Heart disease Paternal Grandfather    Breast cancer Maternal Aunt     Prior to Admission medications   Medication Sig Start Date End Date Taking? Authorizing  Provider  Calcium Carbonate Antacid (TUMS PO) Take 500-1,000 mg by mouth 3 (three) times daily as needed (indigestion/heartburn.).   Yes [provider]  Cholecalciferol (VITAMIN D-3) 25 MCG (1000 UT) CAPS Take 1,000 Units by mouth daily.   Yes [provider]  Coenzyme Q10 100 MG TABS Take 100 mg by mouth daily.   Yes [provider]  fluticasone (FLONASE) 50 MCG/ACT nasal spray Place 1 spray into both nostrils daily as needed  for allergies.   Yes [provider]  Probiotic Product (PROBIOTIC DAILY PO) Take 1 capsule by mouth daily.   Yes [provider]  tretinoin (RETIN-A) 0.1 % cream Apply 1 application. topically at bedtime. 06/21/21  Yes [provider]  Turmeric Curcumin 500 MG CAPS Take 500 mg by mouth daily.   Yes [provider]  Wheat Dextrin (BENEFIBER PO) Take 1 Dose by mouth daily. One spoonful in morning coffee   Yes [provider]    Physical Exam: Vitals:   10/07/21 1607 10/07/21 1700 10/07/21 1730 10/07/21 1835  BP:  (!) 113/40 (!) 103/43 (!) 108/56  Pulse: 72 84 81 77  Resp: 17 (!) 24 19 17   Temp: 98.1 F (36.7 C)     TempSrc: Oral     SpO2: 100% 100% 100% 100%  Weight:      Height:       General exam: Alert, awake, oriented x 3; chronically ill in appearance; frail and in no major distress currently.  Reports no nausea vomiting. Respiratory system: Good air movement bilaterally; no wheezing or rales appreciated.  Positive scattered rhonchi. Cardiovascular system: Irregular rate, no rubs, no murmurs, no gallops. Gastrointestinal system: Abdomen is nondistended, soft and nontender. No organomegaly or masses felt. Normal bowel sounds heard. Central nervous system: Alert and oriented. No focal neurological deficits. Extremities: No cyanosis or clubbing.  No edema. Skin: No petechiae. Psychiatry: Judgement and insight appear normal. Mood & affect appropriate.   Data Reviewed: CBC: WBC 16.3, hemoglobin 11.1, platelet count 292 K Comprehensive metabolic panel: Sodium 154, potassium 3.6, bicarb 31, BUN 19, creatinine 0.91; normal LFTs. Magnesium 1.7 TSH: 1.305  Assessment and Plan: * Atrial fibrillation with RVR ([place]) - Patient started on Cardizem drip with subsequent discontinuation in the setting of hypotension. -Admitted to stepdown  -TSH ordered and demonstrating normal thyroid function  -Given prior history of bradycardia beta-blockers  will be avoided -Continue holding Cardizem at this point as patient has transition back to sinus rhythm and experienced hypotension. -After discussing with cardiology service decision made to start patient on Multaq  -Continue Lovenox for anticoagulation purposes. -Follow-up 2D echo.    Small cell lung cancer ([place]) - Continue outpatient follow-up with oncology service.  GERD (gastroesophageal reflux disease) - Continue PPI.  Chest pain -Atypical in presentation -Very likely triggered by arrhythmia event -Negative troponin -2D echo will be done to assess for structure and wall motion abnormalities. -Currently receiving anticoagulation.  Hypertension - At home not taking any medication recently blood pressure -Transient hypotension while treating rate earlier. -holding blood pressure meds at this point -Follow vital signs.  COPD (chronic obstructive pulmonary disease) (HCC) - Allergies/no acute exacerbation seen currently -Continue as needed bronchodilators.      Advance Care Planning:   Code Status: Full Code   Consults: Cardiology service  Family Communication: No family at bedside.  Severity of Illness: The appropriate patient status for this patient is INPATIENT. Inpatient status is judged to be reasonable and necessary in order to provide  the required intensity of service to ensure the patient's safety. The patient's presenting symptoms, physical exam findings, and initial radiographic and laboratory data in the context of their chronic comorbidities is felt to place them at high risk for further clinical deterioration. Furthermore, it is not anticipated that the patient will be medically stable for discharge from the hospital within 2 midnights of admission.   * I certify that at the point of admission it is my clinical judgment that the patient will require inpatient hospital care spanning beyond 2 midnights from the point of admission due to high intensity of service,  high risk for further deterioration and high frequency of surveillance required.*  Author: Barton Dubois, MD 10/07/2021 6:50 PM  For on call review [email or web address].

## 2021-10-07 NOTE — Assessment & Plan Note (Signed)
-   At home not taking any medication recently blood pressure -Transient hypotension while treating rate earlier. -holding blood pressure meds at this point -Follow vital signs.

## 2021-10-07 NOTE — Telephone Encounter (Signed)
Per [facility]  MRCP is scheduled on 11/04/21 at 9:15 am

## 2021-10-07 NOTE — ED Notes (Signed)
Pt's HR consistently high the last several cycles. Titrating Cardizem drip by 2.5, currently at dose of 9.5--HR still in 130s. MD made aware

## 2021-10-07 NOTE — ED Notes (Signed)
Temporary rate reduction after cardizem push. 166 initial rate. 90s after push. Now back to 125. Edp aware. Gtt ordered

## 2021-10-07 NOTE — Consult Note (Addendum)
Cardiology Consultation:   Patient ID: Claudia [name] MRN: [id number]; DOB: [birth year]  Admit date: 10/07/2021 Date of Consult: 10/07/2021  PCP:  [name], MD   [facility] Providers Cardiologist:  None        Patient Profile:   Claudia [name] is a 76 y.o. female with a hx of small cell lung cancer, in 2011 and recurrence 2015, Bradycardia (perhaps related to BB), COPD, GERD,   who is being seen 10/07/2021 for the evaluation of chest pain, afib and hypotension at the request of Dr. Dyann Kief.  HTN listed as Dx but pt denies.  On no BP meds.  Also hx of lymphocytic colitis.   History of Present Illness:   Claudia [name] with no significant cardiac hx.  She presented to the ER this AM with chest pain,  She woke up and had sudden onset CP that increased with deep breaths  Pain was sharp pain and severe..  She says over the day it has persisted but is less and less intense   The pt had troponins drawn which were normal;   CTA was neg for PE  It noted cardiomegaly with minimal ant pericardial fluid Question of left apical ventricular hypoenhancement on 72/9   Also noted Moderate centrilobular emphysema  EKG:  The EKG was personally reviewed and demonstrates:  initial EKG SR then atrial fib.  No acute ST changes. On arrival to ICU pt in SR now back in atrial tach Telemetry:  Telemetry was personally reviewed and demonstrates:  SR and ectopic atrial rhythm    This AM she developed atrial fib wit RVR.  She was placed on IV diltiazem.  Her BP was soft and she was  transferred to ICU.   BP in 80s she has had 4 boluses of 250cc NS and her dilt decreased to 5 and we will stop.  She is now in SR. She denies palpitations today or ever  Currently still with chest pain 2/10.  Again,  initially 10/10 and while worse with deep breath it was consistent pain.  No nausea no diaphoresis.  She is unaware of rapid HR and low BP. No dizziness, no lightheadedness.    Once dilt stopped BP up to 798 systolic.    The pt notes no CP prior to day   She was treated for a URI a few wks ago  (yellow sputum)  Received ABX  This has improved   No CP at time    Denies injury/overexertoin   Prior to today the pt says she had decrease in weight   Had hiccups starting several months ago  Worse with eating   Because of this she is eating less  Lost 20 lbs   Hiccups have subsided   Weight may be going up   Undergoing eval for ? pancreatic cyst.    (Was seen in 2014 by Dr. Sallyanne Kuster for bradycardia on BB due to mild increase in cardiac enzymes  with acute respiratory illness in 2013.  Dr. Sallyanne Kuster felt with pts exercising 6 days per week and fatigue along with mild resting brady so weaned off BB.  And no cardiac test for ischemia).  Prior echo in 2013 with EF 55-60% ventricular septum with septal motion paradox , trivial pericardial effusion.  G1 DD.   Past Medical History:  Diagnosis Date   Anemia    Arthritis    Bradycardia    mild,may be due to beta blocker therapy   COPD (chronic obstructive pulmonary disease) (  [place])    GERD (gastroesophageal reflux disease)    otc   Hypertension 06/01/2019   pt denies   Local recurrence of lung cancer ([place]) dx'd 07/2013   rt thoracotomy chemo comp 12/2013   Lung cancer ([place]) 03/30/2008   Dr. Julien Nordmann, finished chemo, sp radiation, left upper    Lymphocytic colitis    Pneumonia     Past Surgical History:  Procedure Laterality Date   BRONCHOSCOPY  2011   DILATION AND CURETTAGE OF UTERUS     ESOPHAGOGASTRODUODENOSCOPY (EGD) WITH PROPOFOL N/A 07/29/2021   Procedure: ESOPHAGOGASTRODUODENOSCOPY (EGD) WITH PROPOFOL;  Surgeon: Harvel Quale, MD;  Location: [facility];  Service: Gastroenterology;  Laterality: N/A;  1235 ASA 2   NECK SURGERY  1980's   THORACOTOMY Right 09/21/2013   Procedure: THORACOTOMY MAJOR;  Surgeon: Gaye Pollack, MD;  Location: [facility] OR;  Service: Thoracic;  Laterality: Right;   UTERINE FIBROID SURGERY  2012   WEDGE RESECTION Right 09/21/2013    Procedure: RIGHT UPPER LOBE WEDGE RESECTION;  Surgeon: Gaye Pollack, MD;  Location: [facility];  Service: Thoracic;  Laterality: Right;     Home Medications:  Prior to Admission medications   Medication Sig Start Date End Date Taking? Authorizing Provider  Calcium Carbonate Antacid (TUMS PO) Take 500-1,000 mg by mouth 3 (three) times daily as needed (indigestion/heartburn.).   Yes [provider]  Cholecalciferol (VITAMIN D-3) 25 MCG (1000 UT) CAPS Take 1,000 Units by mouth daily.   Yes [provider]  Coenzyme Q10 100 MG TABS Take 100 mg by mouth daily.   Yes [provider]  fluticasone (FLONASE) 50 MCG/ACT nasal spray Place 1 spray into both nostrils daily as needed for allergies.   Yes [provider]  Probiotic Product (PROBIOTIC DAILY PO) Take 1 capsule by mouth daily.   Yes [provider]  tretinoin (RETIN-A) 0.1 % cream Apply 1 application. topically at bedtime. 06/21/21  Yes [provider]  Turmeric Curcumin 500 MG CAPS Take 500 mg by mouth daily.   Yes [provider]  Wheat Dextrin (BENEFIBER PO) Take 1 Dose by mouth daily. One spoonful in morning coffee   Yes [provider]    Inpatient Medications: Scheduled Meds:  Chlorhexidine Gluconate Cloth  6 each Topical Q0600   dronedarone  400 mg Oral BID WC   enoxaparin (LOVENOX) injection  50 mg Subcutaneous Q12H   pantoprazole  40 mg Oral Daily   sodium chloride flush  3 mL Intravenous Q12H   Continuous Infusions:  sodium chloride     lactated ringers     PRN Meds: sodium chloride, acetaminophen **OR** acetaminophen, fluticasone, ondansetron **OR** ondansetron (ZOFRAN) IV, sodium chloride flush  Allergies:    Allergies  Allergen Reactions   Azithromycin Shortness Of Breath and Rash    Took Tussionex at same time Jan 2013.   Tussionex Pennkinetic Er [Hydrocod Poli-Chlorphe Poli Er] Shortness Of Breath and Rash    Took Azithromycin at same time Jan  2013    Social History:   Social History   Socioeconomic History   Marital status: Married    Spouse name: Not on file   Number of children: Not on file   Years of education: Not on file   Highest education level: Not on file  Occupational History   Occupation: clerical    Employer: [employer]  Tobacco Use   Smoking status: Former    Packs/day: 1.00    Years: 35.00  Total pack years: 35.00    Types: Cigarettes    Quit date: 03/31/2007    Years since quitting: 14.5    Passive exposure: Past   Smokeless tobacco: Never  Vaping Use   Vaping Use: Never used  Substance and Sexual Activity   Alcohol use: Yes    Comment: occassional about 3 drinks per year   Drug use: No   Sexual activity: Never  Other Topics Concern   Not on file  Social History Narrative   Not on file   Social Determinants of Health   Financial Resource Strain: Not on file  Food Insecurity: Not on file  Transportation Needs: Not on file  Physical Activity: Not on file  Stress: Not on file  Social Connections: Not on file  Intimate Partner Violence: Not on file    Family History:    Family History  Problem Relation Age of Onset   Brain cancer Mother        brain cancer   Emphysema Father    Diabetes Son    Heart disease Paternal Grandfather    Breast cancer Maternal Aunt      ROS:  Please see the history of present illness.  General:no colds or fevers, no weight changes Skin:no rashes or ulcers HEENT:no blurred vision, no congestion CV:see HPI PUL:see HPI GI:no diarrhea constipation or melena, no indigestion GU:no hematuria, no dysuria MS:no joint pain, no claudication Neuro:no syncope, no lightheadedness Endo:no diabetes, no thyroid disease  All other ROS reviewed and negative.     Physical Exam/Data:   Vitals:   10/07/21 1426 10/07/21 1500 10/07/21 1537 10/07/21 1607  BP: (!) 76/41 (!) 99/32 (!) 100/56   Pulse: 73 74 76 72  Resp: 20 20 15 17   Temp:    98.1 F (36.7 C)   TempSrc:    Oral  SpO2: 100% 100% 100% 100%  Weight:      Height:        Intake/Output Summary (Last 24 hours) at 10/07/2021 1658 Last data filed at 10/07/2021 1600 Gross per 24 hour  Intake 637.63 ml  Output 125 ml  Net 512.63 ml      10/07/2021   12:03 PM 10/07/2021    4:45 AM 10/06/2021   11:00 AM  Last 3 Weights  Weight (lbs) 108 lb 11 oz 110 lb 110 lb  Weight (kg) 49.3 kg 49.896 kg 49.896 kg     Body mass index is 19.88 kg/m.  General:  Frail female, in no acute distress HEENT: normal Neck: no JVD Vascular: No carotid bruits; Distal pulses 2+ bilaterally Cardiac:  normal S1, S2; RRR; no murmur gallup rub or click Lungs:  clear to auscultation bilaterally, no wheezing, occ rhonchi, no rales  Abd: soft, nontender, no hepatomegaly  Ext: no edema Musculoskeletal:  No deformities, BUE and BLE strength normal and equal Skin: warm and dry  Neuro:  alert and oriented X 3 MAE follows command, no focal abnormalities noted Psych:  Normal affect    Relevant CV Studies: Echo 04/10/11  Study Conclusions   - Left ventricle: Systolic function was normal. The    estimated ejection fraction was in the range of 55% to    60%. Wall motion was normal; there were no regional wall    motion abnormalities. Doppler parameters are consistent    with abnormal left ventricular relaxation (grade 1    diastolic dysfunction).  - Ventricular septum: Septal motion showed paradox.  - Atrial septum: No defect or patent foramen  ovale was    identified.  - Pericardium, extracardiac: A trivial pericardial effusion    was identified.  Impressions:   - Findings are not consistent with elevated intracardiac    pressures or decompensated CHF.   Laboratory Data:  High Sensitivity Troponin:   Recent Labs  Lab 10/07/21 0546 10/07/21 0811  TROPONINIHS 5 4     Chemistry Recent Labs  Lab 10/02/21 1051 10/07/21 0546 10/07/21 0811  NA 139 138  --   K 4.2 3.6  --   CL 102 101  --   CO2 32  31  --   GLUCOSE 90 135*  --   BUN 23 19  --   CREATININE 0.83 0.91  --   CALCIUM 10.4* 9.7  --   MG  --   --  1.7  GFRNONAA >60 >60  --   ANIONGAP 5 6  --     Recent Labs  Lab 10/02/21 1051 10/07/21 0546  PROT 7.1 7.3  ALBUMIN 3.3* 3.2*  AST 79* 16  ALT 60* 27  ALKPHOS 105 101  BILITOT 0.3 0.8   Lipids No results for input(s): "CHOL", "TRIG", "HDL", "LABVLDL", "LDLCALC", "CHOLHDL" in the last 168 hours.  Hematology Recent Labs  Lab 10/02/21 1051 10/07/21 0546  WBC 9.5 16.3*  RBC 3.33* 3.78*  HGB 9.7* 11.1*  HCT 29.8* 34.4*  MCV 89.5 91.0  MCH 29.1 29.4  MCHC 32.6 32.3  RDW 14.7 15.9*  PLT 245 292   Thyroid  Recent Labs  Lab 10/07/21 0546  TSH 1.305    BNPNo results for input(s): "BNP", "PROBNP" in the last 168 hours.  DDimer No results for input(s): "DDIMER" in the last 168 hours.   Radiology/Studies:  CT Angio Chest Pulmonary Embolism (PE) W or WO Contrast  Addendum Date: 10/07/2021   ADDENDUM REPORT: 10/07/2021 08:58 ADDENDUM: Not mentioned in the impressions is possible left apical ventricular hypoenhancement. Correlate with myocardial ischemic symptoms. Electronically Signed   By: Abigail Miyamoto M.D.   On: 10/07/2021 08:58   Result Date: 10/07/2021 CLINICAL DATA:  Anterior chest pain with inspiration. Status post wedge resection in 2015 for lung cancer. COPD. History of small-cell lung cancer. * Tracking Code: BO * EXAM: CT ANGIOGRAPHY CHEST WITH CONTRAST TECHNIQUE: Multidetector CT imaging of the chest was performed using the standard protocol during bolus administration of intravenous contrast. Multiplanar CT image reconstructions and MIPs were obtained to evaluate the vascular anatomy. RADIATION DOSE REDUCTION: This exam was performed according to the departmental dose-optimization program which includes automated exposure control, adjustment of the mA and/or kV according to patient size and/or use of iterative reconstruction technique. CONTRAST:  59mL  OMNIPAQUE IOHEXOL 350 MG/ML SOLN COMPARISON:  Chest radiograph 10/07/2021.  Chest CT of 10/02/2021. FINDINGS: Cardiovascular: The quality of this exam for evaluation of pulmonary embolism is good. No evidence of pulmonary embolism. Bovine arch. Advanced aortic and branch vessel atherosclerosis. Narrowing of the origin of the left carotid artery. Mild cardiomegaly with minimal anterior pericardial fluid. Question of left apical ventricular hypoenhancement on 72/9. Mediastinum/Nodes: No mediastinal or hilar adenopathy. Progressive esophageal dilatation with debris within. Lungs/Pleura: Circumferential left-sided pleural thickening and small volume left-sided pleural fluid are similar. Trace right pleural fluid is new. Moderate centrilobular emphysema. Again identified are clustered nodules within the anterior right upper lobe, likely post infectious/inflammatory. Right lower lobe 4 mm pulmonary nodule on 94/11 is unchanged. Left upper lung and perihilar consolidation, architectural distortion, traction bronchiectasis again identified. When comparing to the 04/02/2021 exam,  there is increased anterior left lung soft tissue density with small volume extra alveolar gas including on 46/11 and 31/9. Upper Abdomen: Normal imaged portions of the liver, spleen, stomach, pancreas, gallbladder, adrenal glands. Upper pole left renal 1.8 cm low-density lesion is likely a cyst but is incompletely imaged. An upper pole right renal 8 mm complex lesion had decreased in size on the prior exam, favoring a hemorrhagic cyst. Musculoskeletal: Mild osteopenia. Review of the MIP images confirms the above findings. IMPRESSION: 1.  No evidence of pulmonary embolism. 2. Presumed radiation induced consolidation within the perihilar and upper left lung. When compared to the January exam, increased soft tissue density anteriorly with minimal new extraalveolar gas. Presuming the patient has not had recent radiation therapy to suggest developing  fibrosis, consider PET to exclude recurrent disease. 3. Similar small left and new trace right pleural effusions. 4. Progressive esophageal dilatation with debris within. Dysmotility and/or gastroesophageal reflux. Correlate with endoscopy of 07/29/2021. 5. Aortic atherosclerosis (ICD10-I70.0), coronary artery atherosclerosis and emphysema (ICD10-J43.9). Electronically Signed: By: Abigail Miyamoto M.D. On: 10/07/2021 08:41   DG Chest Port 1 View  Result Date: 10/07/2021 CLINICAL DATA:  Chest pain and shortness of breath. History of lung cancer. EXAM: PORTABLE CHEST 1 VIEW COMPARISON:  Chest CT with contrast 10/02/2021, PA and lateral chest 09/11/2021 FINDINGS: Grossly stable appearance of left-sided volume loss, posttreatment related upper lobe fibrosis with perihilar spiculation, and tenting along the left hemidiaphragm. Small left pleural effusion also appears similar. There previously was airspace disease along the superior aspect of the perihilar fibrosis which is not seen today. No infiltrate like abnormality is seen. The right lung is clear with COPD and postsurgical change in the upper lobe. The cardiac size is normal. There is aortic calcification with stable mediastinal configuration. There is osteopenia with degenerative changes of the spine. IMPRESSION: Left-sided postsurgical change, volume loss and treatment related fibrosis limit evaluation of the upper lung field. Elsewhere no acute process is suspected. COPD. Electronically Signed   By: Telford Nab M.D.   On: 10/07/2021 06:40     Assessment and Plan:   Chest pain, neg troponin, pain still 2/10 but feels much better.  EKG without acute ST changes.  Will check Echo. Neg PE. CTA of chest with possible lt apical ventricular hypoenhancement, see Dr. Alan Ripper note.  PAF with RVR went into after arrival.  Given IV dilt and she did convert to SR vs atrial arrythmia but became hypotensive. Rec'd total of 1 L of NS and dilt stopped.  BP has come up.   Dr. Harrington Challenger talked with EP and we ordered Multaq.  She has hx of bradycardia with BB and hx of lung cancer we were concerned about side effects with amiodarone.   Again will check Echo. On heparin and now changed to lovenox 1 mg per Kg  CHA2DS2VASc of 3.  Hx small cell lung cancer.  Treated initially in  2011 (chemotherapy, XRT, cranial XRT)..  Recurrence in 2015 (chemotherapy)  Under observation by Mayme Genta.  Small pancreatic lesion to undergo evaluation Anemia PT sent with hemoccults REnal mass  Followed in urology  .   Risk Assessment/Risk Scores:      CHA2DS2-VASc Score = 3   This indicates a 3.2% annual risk of stroke. The patient's score is based upon: CHF History: 0 HTN History: 0 Diabetes History: 0 Stroke History: 0 Vascular Disease History: 0 Age Score: 2 Gender Score: 1     For questions or updates, please contact [facility] Please consult [email or web address] for contact info under    Signed, Cecilie Kicks, NP  10/07/2021 4:58 PM  Pt seen and examined I have amended note above by L INgold to reflect my findings   Patient is a 76 yo with no significant cardiac Hx   Presented today to ED with CP   Pain sharp, pleuritic     Over the past hours since admit it has gradually improved     Early in hosp stay she also developed afib with RVR  She did not sense  Diltiazem used for rate control but lmiited by low bp   She converted to SR / ectopic atrial rhythm  Currently she appears comfortable   Mild discomfort in chest with deep inspiraton  On exam Pt in NAD   BP 110s/  HR 60s Very thin 76 yo Neck:  JVP is normal  No bruits Lungs are CTA Cardiac exam:  RRR  no murmurs Chest  Nontender Abd is supple   No masses  Nontender Ext with 2+DP pulses  No edema   Impression 1  CP  Atypical for cardiac   ? Pleural inflammation or muscular.       I have reviewed CT scan done earlier  Comment on hypoperfusion.  Not sure how accurate.  WIll get echo to evaluate LVEF with regional  wall motion.  AGain, I am not convinced there is active ischemia.    2.  Afib with RVR  Pt with transient spell   She did not sense it   In treating rates.she became hypotensive.       I am suspicious that patient may be in and out of afib at home.  She does not seem to be systemically ill enough to trigger an episode today, though her WBC is high.   For no, I  would recommend  Multaq to try to maintain SR, avoid needing IV dilt/b blocker as used earlier today .  Anticoagulate for now     Could  continue after D/C with close follow up as outpt with  event monitor Echo ordered  Check TSH  Will continue to follow   Dorris Carnes MD

## 2021-10-07 NOTE — Assessment & Plan Note (Signed)
Continue PPI ?

## 2021-10-07 NOTE — Progress Notes (Signed)
Patient arrived to unit from ED around 12pm. Patient on cardizem drip and heparin drip. Both IV's patent and blood return noted. Blood pressures running soft and heart rate noted to be in the 70's. Cardizem drip titrated down to 5mg  and blood pressures still soft with maps below 65. Patient asymptomatic and had no complaints. Dr Dyann Kief made aware of blood pressures. Bolus's given per orders with little effect. Dr Dyann Kief aware. Cardiology up to see patient. New orders placed and done. EKG was done and Dr Harrington Challenger on unit and seen result.  Patient now off cardizem drip and heparin drip with no current complaints.

## 2021-10-07 NOTE — Progress Notes (Signed)
ANTICOAGULATION CONSULT NOTE - Initial Consult  Pharmacy Consult for heparin Indication:  r/o PE  Allergies  Allergen Reactions   Azithromycin Shortness Of Breath and Rash    Took Tussionex at same time Jan 2013.   Tussionex Pennkinetic Er [Hydrocod Poli-Chlorphe Poli Er] Shortness Of Breath and Rash    Took Azithromycin at same time Jan 2013    Patient Measurements: Height: 5\' 2"  (157.5 cm) Weight: 49.9 kg (110 lb) IBW/kg (Calculated) : 50.1  Vital Signs: Temp: 98.4 F (36.9 C) (07/11 0447) Temp Source: Oral (07/11 0447) BP: 143/84 (07/11 0600) Pulse Rate: 120 (07/11 0600)  Labs: Recent Labs    10/07/21 0546  HGB 11.1*  HCT 34.4*  PLT 292  CREATININE 0.91  TROPONINIHS 5    Estimated Creatinine Clearance: 42.1 mL/min (by C-G formula based on SCr of 0.91 mg/dL).   Medical History: Past Medical History:  Diagnosis Date   Anemia    Arthritis    Bradycardia    mild,may be due to beta blocker therapy   COPD (chronic obstructive pulmonary disease) ([facility])    GERD (gastroesophageal reflux disease)    otc   Hypertension 06/01/2019   pt denies   Local recurrence of lung cancer ([place]) dx'd 07/2013   rt thoracotomy chemo comp 12/2013   Lung cancer ([place]) 03/30/2008   Dr. Julien Nordmann, finished chemo, sp radiation, left upper    Lymphocytic colitis    Pneumonia     Assessment: 76yo female with lung cancer c/o CP that worsens with deep inspirations, concern for PE >> to begin heparin.  Goal of Therapy:  Heparin level 0.3-0.7 units/ml Monitor platelets by anticoagulation protocol: Yes   Plan:  Heparin 2000 units IV bolus x1 followed by infusion at 800 units/hr. Monitor heparin levels and CBC.  Wynona Neat, PharmD, BCPS  10/07/2021,6:40 AM

## 2021-10-07 NOTE — Assessment & Plan Note (Signed)
-   Continue outpatient follow-up with oncology service.

## 2021-10-08 ENCOUNTER — Encounter (HOSPITAL_COMMUNITY): Payer: Self-pay | Admitting: Internal Medicine

## 2021-10-08 ENCOUNTER — Inpatient Hospital Stay (HOSPITAL_BASED_OUTPATIENT_CLINIC_OR_DEPARTMENT_OTHER): Payer: Medicare PPO

## 2021-10-08 DIAGNOSIS — Z87891 Personal history of nicotine dependence: Secondary | ICD-10-CM | POA: Diagnosis not present

## 2021-10-08 DIAGNOSIS — Z79899 Other long term (current) drug therapy: Secondary | ICD-10-CM | POA: Diagnosis not present

## 2021-10-08 DIAGNOSIS — R079 Chest pain, unspecified: Secondary | ICD-10-CM

## 2021-10-08 DIAGNOSIS — J449 Chronic obstructive pulmonary disease, unspecified: Secondary | ICD-10-CM | POA: Diagnosis not present

## 2021-10-08 DIAGNOSIS — I10 Essential (primary) hypertension: Secondary | ICD-10-CM | POA: Diagnosis not present

## 2021-10-08 DIAGNOSIS — N2889 Other specified disorders of kidney and ureter: Secondary | ICD-10-CM | POA: Diagnosis not present

## 2021-10-08 DIAGNOSIS — I4891 Unspecified atrial fibrillation: Secondary | ICD-10-CM | POA: Diagnosis not present

## 2021-10-08 DIAGNOSIS — I3139 Other pericardial effusion (noninflammatory): Secondary | ICD-10-CM | POA: Diagnosis not present

## 2021-10-08 DIAGNOSIS — C3492 Malignant neoplasm of unspecified part of left bronchus or lung: Secondary | ICD-10-CM | POA: Diagnosis not present

## 2021-10-08 LAB — BASIC METABOLIC PANEL
Anion gap: 5 (ref 5–15)
BUN: 15 mg/dL (ref 8–23)
CO2: 29 mmol/L (ref 22–32)
Calcium: 9 mg/dL (ref 8.9–10.3)
Chloride: 103 mmol/L (ref 98–111)
Creatinine, Ser: 0.8 mg/dL (ref 0.44–1.00)
GFR, Estimated: >60 mL/min (ref >60)
Glucose, Bld: 91 mg/dL (ref 70–99)
Potassium: 3.6 mmol/L (ref 3.5–5.1)
Sodium: 137 mmol/L (ref 135–145)

## 2021-10-08 LAB — ECHOCARDIOGRAM COMPLETE
AR max vel: 2.68 cm2
AV Area VTI: 2.35 cm2
AV Area mean vel: 2.44 cm2
AV Mean grad: 2.7 mmHg
AV Peak grad: 5 mmHg
Ao pk vel: 1.12 m/s
Area-P 1/2: 3 cm2
Height: 62 in
MV VTI: 1.75 cm2
S' Lateral: 2.9 cm
Weight: 1738.99 oz

## 2021-10-08 LAB — CBC
HCT: 26.8 % — ABNORMAL LOW (ref 36.0–46.0)
Hemoglobin: 8.5 g/dL — ABNORMAL LOW (ref 12.0–15.0)
MCH: 29.3 pg (ref 26.0–34.0)
MCHC: 31.7 g/dL (ref 30.0–36.0)
MCV: 92.4 fL (ref 80.0–100.0)
Platelets: 228 10*3/uL (ref 150–400)
RBC: 2.9 MIL/uL — ABNORMAL LOW (ref 3.87–5.11)
RDW: 16.2 % — ABNORMAL HIGH (ref 11.5–15.5)
WBC: 17.3 10*3/uL — ABNORMAL HIGH (ref 4.0–10.5)
nRBC: 0 % (ref 0.0–0.2)

## 2021-10-08 LAB — FOLATE: Folate: 7.9 ng/mL (ref >5.9)

## 2021-10-08 LAB — IRON AND TIBC
Iron: 13 ug/dL — ABNORMAL LOW (ref 28–170)
Saturation Ratios: 9 % — ABNORMAL LOW (ref 10.4–31.8)
TIBC: 149 ug/dL — ABNORMAL LOW (ref 250–450)
UIBC: 136 ug/dL

## 2021-10-08 LAB — MRSA NEXT GEN BY PCR, NASAL: MRSA by PCR Next Gen: NOT DETECTED

## 2021-10-08 LAB — VITAMIN B12: Vitamin B-12: 559 pg/mL (ref 180–914)

## 2021-10-08 LAB — FERRITIN: Ferritin: 675 ng/mL — ABNORMAL HIGH (ref 11–307)

## 2021-10-08 MED ORDER — DEXTROSE-NACL 5-0.45 % IV SOLN: INTRAVENOUS | Status: DC

## 2021-10-08 MED ORDER — METOPROLOL TARTRATE 5 MG/5ML IV SOLN: 5.0000 mg | INTRAVENOUS | Status: DC | PRN

## 2021-10-08 MED ORDER — IPRATROPIUM-ALBUTEROL 0.5-2.5 (3) MG/3ML IN SOLN: 3.0000 mL | RESPIRATORY_TRACT | Status: DC | PRN

## 2021-10-08 MED ORDER — SENNOSIDES-DOCUSATE SODIUM 8.6-50 MG PO TABS: 1.0000 | ORAL_TABLET | Freq: Every evening | ORAL | Status: DC | PRN

## 2021-10-08 MED ORDER — TRAZODONE HCL 50 MG PO TABS: 50.0000 mg | ORAL_TABLET | Freq: Every evening | ORAL | Status: DC | PRN

## 2021-10-08 MED ORDER — OXYCODONE HCL 5 MG PO TABS: 5.0000 mg | ORAL_TABLET | ORAL | Status: DC | PRN

## 2021-10-08 MED ORDER — HYDRALAZINE HCL 20 MG/ML IJ SOLN: 10.0000 mg | INTRAMUSCULAR | Status: DC | PRN

## 2021-10-08 MED ORDER — GUAIFENESIN 100 MG/5ML PO LIQD: 5.0000 mL | ORAL | Status: DC | PRN

## 2021-10-08 MED ORDER — POTASSIUM CHLORIDE CRYS ER 20 MEQ PO TBCR
40.0000 meq | EXTENDED_RELEASE_TABLET | Freq: Once | ORAL | Status: AC
  Administered 2021-10-08: 40 meq via ORAL
  Filled 2021-10-08: qty 2

## 2021-10-08 NOTE — Progress Notes (Signed)
PROGRESS NOTE    Claudia [name]  ZYS:[id number] DOB: [birth year] DOA: 10/07/2021 PCP: [name], MD   Brief Narrative:  76 y.o. female with medical history significant of small cell lung cancer, gastroesophageal reflux disease, COPD, hypertension and dyslipidemia; who presented to the hospital secondary to shortness of breath and chest discomfort.  Patient does have a history of esophageal dilation and follows outpatient gastroenterology.  Cardiology and GI team consulted.   Assessment & Plan:  Principal Problem:   Atrial fibrillation with RVR (HCC) Active Problems:   Small cell lung cancer (HCC)   COPD (chronic obstructive pulmonary disease) (HCC)   Hypertension   Chest pain   GERD (gastroesophageal reflux disease)     Assessment and Plan: * Atrial fibrillation with RVR (HCC) -Rate is better controlled now.  Currently in normal sinus rhythm.  Echocardiogram shows moderate pericardial effusion without tamponade.  Seen by cardiology team.  Holding off on anticoagulation due to drop in hemoglobin.  Requires GI evaluation.  TSH is unremarkable.    GERD (gastroesophageal reflux disease) Esophageal dysmotility/dilation - Continue PPI.  GI has been consulted.  Currently on liquid diet.  In the past patient has been closely followed by GI for this issue.  Chest pain Pericardial effusion, moderate without tamponade -Does not appear to be cardiac in nature.  Echocardiogram shows normal EF with moderate pericardial effusion.  No evidence of tamponade.  Anemia of chronic disease - No obvious evidence of blood loss but baseline hemoglobin around 12.  This morning 8.5.  Holding off on anticoagulation until cleared by GI  Small cell lung cancer ([place]) - Continue outpatient follow-up with oncology service.  Previously patient has undergone chemo, surgery/resection and radiation.  Now under chronic surveillance with Dr. Earlie Server  Hypertension -Slowly resume as appropriate.  Currently  on hold  COPD (chronic obstructive pulmonary disease) (HCC) Fibrotic changes -As needed bronchodilators.  Fibrotic changes possibly from chronic mild aspiration and previous history of radiation       DVT prophylaxis:   SCDs  Code Status: Full code Family Communication: Husband at bedside  Maintain hospital stay for GI evaluation.  We will transfer patient to telemetry floor.      Subjective: Seen and examined at bedside.  Patient appears to be in normal sinus rhythm.  Overall appears and feels very weak.  Has had at least 20 pound weight loss in the last 6-8 weeks due to poor oral intake.  During my evaluation she denied any chest pain   Examination:  General exam: Appears calm and comfortable  Respiratory system: Clear to auscultation. Respiratory effort normal. Cardiovascular system: S1 & S2 heard, RRR. No JVD, murmurs, rubs, gallops or clicks. No pedal edema. Gastrointestinal system: Abdomen is nondistended, soft and nontender. No organomegaly or masses felt. Normal bowel sounds heard. Central nervous system: Alert and oriented. No focal neurological deficits. Extremities: Symmetric 5 x 5 power. Skin: No rashes, lesions or ulcers Psychiatry: Judgement and insight appear normal. Mood & affect appropriate.     Objective: Vitals:   10/08/21 1200 10/08/21 1230 10/08/21 1300 10/08/21 1330  BP: 106/68 (!) 109/44 (!) 99/44 (!) 122/52  Pulse: 98 94 87 87  Resp: (!) 25 20 (!) 24 18  Temp:      TempSrc:      SpO2: 97% 98% 96% 95%  Weight:      Height:        Intake/Output Summary (Last 24 hours) at 10/08/2021 1402 Last data filed at 10/08/2021 1300 Gross  per 24 hour  Intake 1111.79 ml  Output 625 ml  Net 486.79 ml   Filed Weights   10/07/21 0445 10/07/21 1203  Weight: 49.9 kg 49.3 kg     Data Reviewed:   CBC: Recent Labs  Lab 10/02/21 1051 10/07/21 0546 10/08/21 0410  WBC 9.5 16.3* 17.3*  NEUTROABS 8.0*  --   --   HGB 9.7* 11.1* 8.5*  HCT 29.8* 34.4*  26.8*  MCV 89.5 91.0 92.4  PLT 245 292 563   Basic Metabolic Panel: Recent Labs  Lab 10/02/21 1051 10/07/21 0546 10/07/21 0811 10/08/21 0410  NA 139 138  --  137  K 4.2 3.6  --  3.6  CL 102 101  --  103  CO2 32 31  --  29  GLUCOSE 90 135*  --  91  BUN 23 19  --  15  CREATININE 0.83 0.91  --  0.80  CALCIUM 10.4* 9.7  --  9.0  MG  --   --  1.7  --    GFR: Estimated Creatinine Clearance: 47.3 mL/min (by C-G formula based on SCr of 0.8 mg/dL). Liver Function Tests: Recent Labs  Lab 10/02/21 1051 10/07/21 0546  AST 79* 16  ALT 60* 27  ALKPHOS 105 101  BILITOT 0.3 0.8  PROT 7.1 7.3  ALBUMIN 3.3* 3.2*   No results for input(s): "LIPASE", "AMYLASE" in the last 168 hours. No results for input(s): "AMMONIA" in the last 168 hours. Coagulation Profile: No results for input(s): "INR", "PROTIME" in the last 168 hours. Cardiac Enzymes: No results for input(s): "CKTOTAL", "CKMB", "CKMBINDEX", "TROPONINI" in the last 168 hours. BNP (last 3 results) No results for input(s): "PROBNP" in the last 8760 hours. HbA1C: No results for input(s): "HGBA1C" in the last 72 hours. CBG: No results for input(s): "GLUCAP" in the last 168 hours. Lipid Profile: No results for input(s): "CHOL", "HDL", "LDLCALC", "TRIG", "CHOLHDL", "LDLDIRECT" in the last 72 hours. Thyroid Function Tests: Recent Labs    10/07/21 0546  TSH 1.305   Anemia Panel: No results for input(s): "VITAMINB12", "FOLATE", "FERRITIN", "TIBC", "IRON", "RETICCTPCT" in the last 72 hours. Sepsis Labs: No results for input(s): "PROCALCITON", "LATICACIDVEN" in the last 168 hours.  Recent Results (from the past 240 hour(s))  MRSA Next Gen by PCR, Nasal     Status: None   Collection Time: 10/07/21 11:57 AM   Specimen: Nasal Mucosa; Nasal Swab  Result Value Ref Range Status   MRSA by PCR Next Gen NOT DETECTED NOT DETECTED Final    Comment: (NOTE) The GeneXpert MRSA Assay (FDA approved for NASAL specimens only), is one  component of a comprehensive MRSA colonization surveillance program. It is not intended to diagnose MRSA infection nor to guide or monitor treatment for MRSA infections. Test performance is not FDA approved in patients less than 19 years old. Performed at [facility], [address], [place],  [address]          Radiology Studies: ECHOCARDIOGRAM COMPLETE  Result Date: 10/08/2021    ECHOCARDIOGRAM REPORT   Patient Name:   MILINA PAGETT Date of Exam: 10/08/2021 Medical Rec #:  [id number]       Height:       62.0 in Accession #:    [id number]      Weight:       108.7 lb Date of Birth:  09/13/1945      BSA:          1.475 m Patient Age:  75 years        BP:           88/36 mmHg Patient Gender: F               HR:           79 bpm. Exam Location:  [facility] Procedure: 2D Echo, Cardiac Doppler and Color Doppler Indications:    Chest Pain  History:        Patient has prior history of Echocardiogram examinations, most                 recent 04/10/2011. COPD, Arrythmias:Atrial Fibrillation,                 Signs/Symptoms:Chest Pain; Risk Factors:Hypertension. Lung CA.  Sonographer:    Wenda Low Referring Phys: Pole Ojea  1. Left ventricular ejection fraction, by estimation, is 60 to 65%. The left ventricle has normal function. The left ventricle has no regional wall motion abnormalities. Left ventricular diastolic parameters were normal.  2. Right ventricular systolic function is normal. The right ventricular size is normal. There is normal pulmonary artery systolic pressure. The estimated right ventricular systolic pressure is 94.8 mmHg.  3. Moderate pericardial effusion. The pericardial effusion is posterior to the left ventricle and anterior to the right ventricle. There is no evidence of cardiac tamponade.  4. The mitral valve is grossly normal. No evidence of mitral valve regurgitation. No evidence of mitral stenosis.  5. The aortic valve is tricuspid. Aortic valve  regurgitation is not visualized. No aortic stenosis is present.  6. The inferior vena cava is normal in size with greater than 50% respiratory variability, suggesting right atrial pressure of 3 mmHg. FINDINGS  Left Ventricle: Left ventricular ejection fraction, by estimation, is 60 to 65%. The left ventricle has normal function. The left ventricle has no regional wall motion abnormalities. The left ventricular internal cavity size was normal in size. There is  no left ventricular hypertrophy. Left ventricular diastolic parameters were normal. Right Ventricle: The right ventricular size is normal. No increase in right ventricular wall thickness. Right ventricular systolic function is normal. There is normal pulmonary artery systolic pressure. The tricuspid regurgitant velocity is 2.70 m/s, and  with an assumed right atrial pressure of 3 mmHg, the estimated right ventricular systolic pressure is 54.6 mmHg. Left Atrium: Left atrial size was normal in size. Right Atrium: Right atrial size was normal in size. Pericardium: A moderately sized pericardial effusion is present. The pericardial effusion is posterior to the left ventricle and anterior to the right ventricle. There is no evidence of cardiac tamponade. Presence of epicardial fat layer. Mitral Valve: The mitral valve is grossly normal. No evidence of mitral valve regurgitation. No evidence of mitral valve stenosis. MV peak gradient, 3.0 mmHg. The mean mitral valve gradient is 1.0 mmHg. Tricuspid Valve: The tricuspid valve is grossly normal. Tricuspid valve regurgitation is trivial. No evidence of tricuspid stenosis. Aortic Valve: The aortic valve is tricuspid. Aortic valve regurgitation is not visualized. No aortic stenosis is present. Aortic valve mean gradient measures 2.7 mmHg. Aortic valve peak gradient measures 5.0 mmHg. Aortic valve area, by VTI measures 2.35 cm. Pulmonic Valve: The pulmonic valve was grossly normal. Pulmonic valve regurgitation is trivial.  No evidence of pulmonic stenosis. Aorta: The aortic root is normal in size and structure. Venous: The inferior vena cava is normal in size with greater than 50% respiratory variability, suggesting right atrial pressure of 3 mmHg. IAS/Shunts: The  atrial septum is grossly normal.  LEFT VENTRICLE PLAX 2D LVIDd:         4.80 cm   Diastology LVIDs:         2.90 cm   LV e' medial:    8.70 cm/s LV PW:         0.80 cm   LV E/e' medial:  7.7 LV IVS:        1.10 cm   LV e' lateral:   7.40 cm/s LVOT diam:     1.90 cm   LV E/e' lateral: 9.1 LV SV:         49 LV SV Index:   34 LVOT Area:     2.84 cm  RIGHT VENTRICLE RV Basal diam:  1.55 cm RV Mid diam:    1.80 cm RV S prime:     10.30 cm/s TAPSE (M-mode): 2.0 cm LEFT ATRIUM             Index        RIGHT ATRIUM           Index LA diam:        2.80 cm 1.90 cm/m   RA Area:     11.30 cm LA Vol (A2C):   45.8 ml 31.05 ml/m  RA Volume:   23.70 ml  16.07 ml/m LA Vol (A4C):   27.2 ml 18.44 ml/m LA Biplane Vol: 35.3 ml 23.93 ml/m  AORTIC VALVE                    PULMONIC VALVE AV Area (Vmax):    2.68 cm     PV Vmax:       0.83 m/s AV Area (Vmean):   2.44 cm     PV Peak grad:  2.8 mmHg AV Area (VTI):     2.35 cm AV Vmax:           111.67 cm/s AV Vmean:          74.867 cm/s AV VTI:            0.210 m AV Peak Grad:      5.0 mmHg AV Mean Grad:      2.7 mmHg LVOT Vmax:         105.50 cm/s LVOT Vmean:        64.400 cm/s LVOT VTI:          0.174 m LVOT/AV VTI ratio: 0.83  AORTA Ao Root diam: 3.20 cm MITRAL VALVE               TRICUSPID VALVE MV Area (PHT): 3.00 cm    TR Peak grad:   29.2 mmHg MV Area VTI:   1.75 cm    TR Vmax:        270.00 cm/s MV Peak grad:  3.0 mmHg MV Mean grad:  1.0 mmHg    SHUNTS MV Vmax:       0.86 m/s    Systemic VTI:  0.17 m MV Vmean:      46.3 cm/s   Systemic Diam: 1.90 cm MV Decel Time: 253 msec MV E velocity: 67.30 cm/s MV A velocity: 65.60 cm/s MV E/A ratio:  1.03 Eleonore Chiquito MD Electronically signed by Eleonore Chiquito MD Signature Date/Time:  10/08/2021/10:13:27 AM    Final    CT Angio Chest Pulmonary Embolism (PE) W or WO Contrast  Addendum Date: 10/07/2021   ADDENDUM REPORT: 10/07/2021 08:58 ADDENDUM: Not mentioned in the impressions is possible left  apical ventricular hypoenhancement. Correlate with myocardial ischemic symptoms. Electronically Signed   By: Abigail Miyamoto M.D.   On: 10/07/2021 08:58   Result Date: 10/07/2021 CLINICAL DATA:  Anterior chest pain with inspiration. Status post wedge resection in 2015 for lung cancer. COPD. History of small-cell lung cancer. * Tracking Code: BO * EXAM: CT ANGIOGRAPHY CHEST WITH CONTRAST TECHNIQUE: Multidetector CT imaging of the chest was performed using the standard protocol during bolus administration of intravenous contrast. Multiplanar CT image reconstructions and MIPs were obtained to evaluate the vascular anatomy. RADIATION DOSE REDUCTION: This exam was performed according to the departmental dose-optimization program which includes automated exposure control, adjustment of the mA and/or kV according to patient size and/or use of iterative reconstruction technique. CONTRAST:  67mL OMNIPAQUE IOHEXOL 350 MG/ML SOLN COMPARISON:  Chest radiograph 10/07/2021.  Chest CT of 10/02/2021. FINDINGS: Cardiovascular: The quality of this exam for evaluation of pulmonary embolism is good. No evidence of pulmonary embolism. Bovine arch. Advanced aortic and branch vessel atherosclerosis. Narrowing of the origin of the left carotid artery. Mild cardiomegaly with minimal anterior pericardial fluid. Question of left apical ventricular hypoenhancement on 72/9. Mediastinum/Nodes: No mediastinal or hilar adenopathy. Progressive esophageal dilatation with debris within. Lungs/Pleura: Circumferential left-sided pleural thickening and small volume left-sided pleural fluid are similar. Trace right pleural fluid is new. Moderate centrilobular emphysema. Again identified are clustered nodules within the anterior right upper  lobe, likely post infectious/inflammatory. Right lower lobe 4 mm pulmonary nodule on 94/11 is unchanged. Left upper lung and perihilar consolidation, architectural distortion, traction bronchiectasis again identified. When comparing to the 04/02/2021 exam, there is increased anterior left lung soft tissue density with small volume extra alveolar gas including on 46/11 and 31/9. Upper Abdomen: Normal imaged portions of the liver, spleen, stomach, pancreas, gallbladder, adrenal glands. Upper pole left renal 1.8 cm low-density lesion is likely a cyst but is incompletely imaged. An upper pole right renal 8 mm complex lesion had decreased in size on the prior exam, favoring a hemorrhagic cyst. Musculoskeletal: Mild osteopenia. Review of the MIP images confirms the above findings. IMPRESSION: 1.  No evidence of pulmonary embolism. 2. Presumed radiation induced consolidation within the perihilar and upper left lung. When compared to the January exam, increased soft tissue density anteriorly with minimal new extraalveolar gas. Presuming the patient has not had recent radiation therapy to suggest developing fibrosis, consider PET to exclude recurrent disease. 3. Similar small left and new trace right pleural effusions. 4. Progressive esophageal dilatation with debris within. Dysmotility and/or gastroesophageal reflux. Correlate with endoscopy of 07/29/2021. 5. Aortic atherosclerosis (ICD10-I70.0), coronary artery atherosclerosis and emphysema (ICD10-J43.9). Electronically Signed: By: Abigail Miyamoto M.D. On: 10/07/2021 08:41   DG Chest Port 1 View  Result Date: 10/07/2021 CLINICAL DATA:  Chest pain and shortness of breath. History of lung cancer. EXAM: PORTABLE CHEST 1 VIEW COMPARISON:  Chest CT with contrast 10/02/2021, PA and lateral chest 09/11/2021 FINDINGS: Grossly stable appearance of left-sided volume loss, posttreatment related upper lobe fibrosis with perihilar spiculation, and tenting along the left hemidiaphragm.  Small left pleural effusion also appears similar. There previously was airspace disease along the superior aspect of the perihilar fibrosis which is not seen today. No infiltrate like abnormality is seen. The right lung is clear with COPD and postsurgical change in the upper lobe. The cardiac size is normal. There is aortic calcification with stable mediastinal configuration. There is osteopenia with degenerative changes of the spine. IMPRESSION: Left-sided postsurgical change, volume loss and treatment related fibrosis limit  evaluation of the upper lung field. Elsewhere no acute process is suspected. COPD. Electronically Signed   By: Telford Nab M.D.   On: 10/07/2021 06:40        Scheduled Meds:  Chlorhexidine Gluconate Cloth  6 each Topical Q0600   dronedarone  400 mg Oral BID WC   pantoprazole  40 mg Oral Daily   sodium chloride flush  3 mL Intravenous Q12H   Continuous Infusions:  sodium chloride     lactated ringers       LOS: 1 day   Time spent= 35 mins    Tyauna Lacaze Arsenio Loader, MD [facility]  If 7PM-7AM, please contact night-coverage  10/08/2021, 2:02 PM

## 2021-10-08 NOTE — Progress Notes (Signed)
*  PRELIMINARY RESULTS* Echocardiogram 2D Echocardiogram has been performed.  Claudia [name] 10/08/2021, 9:26 AM

## 2021-10-08 NOTE — Progress Notes (Signed)
Cardiology Office Note    Date:  10/20/2021   ID:  Claudia [name], DOB 1945/11/09, MRN [id number]   PCP:  [name], MD   [place]  Cardiologist:  Dorris Carnes, MD   Advanced Practice Provider:  No care team member to display Electrophysiologist:  None   [id number]   Chief Complaint  Patient presents with   Hospitalization Follow-up    History of Present Illness:  Claudia [name] is a 76 y.o. female  with a hx of small cell lung cancer, in 2011 and recurrence 2015, Bradycardia (perhaps related to BB), COPD, GERD,    Was seen in 2014 by Dr. Sallyanne Kuster for bradycardia on BB due to mild increase in cardiac enzymes with acute respiratory illness in 2013. Dr. Sallyanne Kuster felt with pts exercising 6 days per week and fatigue along with mild resting brady so weaned off BB. And no cardiac test for ischemia). Prior echo in 2013 with EF 55-60% ventricular septum with septal motion paradox , trivial pericardial effusion. G1 DD.   Patient was seen 10/07/2021 for the evaluation of chest pain, new afib and hypotension. Troponins negative  CTA of chest with possible atypical ventricular hypoenhancement,  Given IV dilt and she did convert to SR vs atrial arrythmia but became hypotensive. Rec'd total of 1 L of NS and dilt stopped.  BP has come up.  Dr. Harrington Challenger talked with EP and they ordered Multaq.  She has hx of bradycardia with BB and hx of lung cancer. They were concerned about side effects with amiodarone.Chest CT mentions hypoenhancement of the left ventricle.  On Dr. Theodosia Blender review this is a nongated study.  Very difficult to make that call.  Echo LVEF 60-65%, moderate pericardial effusion-started on cochicine. -Her chest CT also shows progressive esophageal dilation.  She has a known history of esophageal dysmotility and gastroparesis.  Dr. Audie Box suspects this is the main cause of her chest symptoms. -Also mention on chest CT of radiation-induced consolidation in the lungs.   There is also mention of possible fibrosis.  This could also explain her chest symptomology.  Hgb dropped and lovenox stopped.  Patient comes in for f/u. Had a stool sample but hasn't heart the result. She says her Hgb has comes up. No symptoms of palpitations, chest pain, dyspnea. Not taking meds at night because of diarrhea in am.  Past Medical History:  Diagnosis Date   Anemia    Arthritis    Bradycardia    mild,may be due to beta blocker therapy   COPD (chronic obstructive pulmonary disease) (HCC)    GERD (gastroesophageal reflux disease)    otc   Hypertension 06/01/2019   pt denies   Local recurrence of lung cancer ([place]) dx'd 07/2013   rt thoracotomy chemo comp 12/2013   Lung cancer ([place]) 03/30/2008   Dr. Julien Nordmann, finished chemo, sp radiation, left upper    Lymphocytic colitis    Pneumonia     Past Surgical History:  Procedure Laterality Date   BRONCHOSCOPY  2011   DILATION AND CURETTAGE OF UTERUS     ESOPHAGOGASTRODUODENOSCOPY (EGD) WITH PROPOFOL N/A 07/29/2021   Procedure: ESOPHAGOGASTRODUODENOSCOPY (EGD) WITH PROPOFOL;  Surgeon: Harvel Quale, MD;  Location: [facility];  Service: Gastroenterology;  Laterality: N/A;  1235 ASA 2   NECK SURGERY  1980's   THORACOTOMY Right 09/21/2013   Procedure: THORACOTOMY MAJOR;  Surgeon: Gaye Pollack, MD;  Location: [facility] OR;  Service: Thoracic;  Laterality: Right;  UTERINE FIBROID SURGERY  2012   WEDGE RESECTION Right 09/21/2013   Procedure: RIGHT UPPER LOBE WEDGE RESECTION;  Surgeon: Gaye Pollack, MD;  Location: [facility] OR;  Service: Thoracic;  Laterality: Right;    Current Medications: Current Meds  Medication Sig   Calcium Carbonate Antacid (TUMS PO) Take 500-1,000 mg by mouth 3 (three) times daily as needed (indigestion/heartburn.).   Cholecalciferol (VITAMIN D-3) 25 MCG (1000 UT) CAPS Take 1,000 Units by mouth daily.   Coenzyme Q10 100 MG TABS Take 100 mg by mouth daily.   dronedarone (MULTAQ) 400 MG tablet Take 1  tablet (400 mg total) by mouth 2 (two) times daily with a meal.   ferrous sulfate 325 (65 FE) MG tablet Take 1 tablet (325 mg total) by mouth daily.   fluticasone (FLONASE) 50 MCG/ACT nasal spray Place 1 spray into both nostrils daily as needed for allergies.   pantoprazole (PROTONIX) 40 MG tablet Take 1 tablet (40 mg total) by mouth daily.   Probiotic Product (PROBIOTIC DAILY PO) Take 1 capsule by mouth daily.   tretinoin (RETIN-A) 0.1 % cream Apply 1 application. topically at bedtime.   Turmeric Curcumin 500 MG CAPS Take 500 mg by mouth daily.     Allergies:   Azithromycin and Tussionex pennkinetic er [hydrocod poli-chlorphe poli er]   Social History   Socioeconomic History   Marital status: Married    Spouse name: Not on file   Number of children: Not on file   Years of education: Not on file   Highest education level: Not on file  Occupational History   Occupation: clerical    Employer: [employer]  Tobacco Use   Smoking status: Former    Packs/day: 1.00    Years: 35.00    Total pack years: 35.00    Types: Cigarettes    Quit date: 03/31/2007    Years since quitting: 14.5    Passive exposure: Past   Smokeless tobacco: Never  Vaping Use   Vaping Use: Never used  Substance and Sexual Activity   Alcohol use: Yes    Comment: occassional about 3 drinks per year   Drug use: No   Sexual activity: Never  Other Topics Concern   Not on file  Social History Narrative   Not on file   Social Determinants of Health   Financial Resource Strain: Not on file  Food Insecurity: Not on file  Transportation Needs: Not on file  Physical Activity: Not on file  Stress: Not on file  Social Connections: Not on file     Family History:  The patient's  family history includes Brain cancer in her mother; Breast cancer in her maternal aunt; Diabetes in her son; Emphysema in her father; Heart disease in her paternal grandfather.   ROS:   Please see the history of present illness.     ROS All other systems reviewed and are negative.   PHYSICAL EXAM:   VS:  BP 116/63 (BP Location: Left Arm, Patient Position: Sitting, Cuff Size: Normal)   Pulse 96   Ht 5\' 1"  (1.549 m)   Wt 109 lb (49.4 kg)   SpO2 98%   BMI 20.60 kg/m   Physical Exam  GEN: Thin, in no acute distress  Neck: no JVD, carotid bruits, or masses Cardiac:RRR; no murmurs, rubs, or gallops  Respiratory:  clear to auscultation bilaterally, normal work of breathing GI: soft, nontender, nondistended, + BS Ext: without cyanosis, clubbing, or edema, Good distal pulses bilaterally Neuro:  Alert  and Oriented x 3 Psych: euthymic mood, full affect  Wt Readings from Last 3 Encounters:  10/20/21 109 lb (49.4 kg)  10/09/21 114 lb 3.2 oz (51.8 kg)  10/06/21 110 lb (49.9 kg)      Studies/Labs Reviewed:   EKG:  EKG is  ordered today.  The ekg ordered today demonstrates NSR with nonspecific ST changes, no acute change  Recent Labs: 10/07/2021: ALT 27; TSH 1.305 10/09/2021: BUN 11; Creatinine, Ser 0.78; Hemoglobin 8.5; Magnesium 1.8; Platelets 230; Potassium 3.8; Sodium 135   Lipid Panel No results found for: "CHOL", "TRIG", "HDL", "CHOLHDL", "VLDL", "LDLCALC", "LDLDIRECT"  Additional studies/ records that were reviewed today include:  Echo 10/08/21 IMPRESSIONS     1. Left ventricular ejection fraction, by estimation, is 60 to 65%. The  left ventricle has normal function. The left ventricle has no regional  wall motion abnormalities. Left ventricular diastolic parameters were  normal.   2. Right ventricular systolic function is normal. The right ventricular  size is normal. There is normal pulmonary artery systolic pressure. The  estimated right ventricular systolic pressure is 71.6 mmHg.   3. Moderate pericardial effusion. The pericardial effusion is posterior  to the left ventricle and anterior to the right ventricle. There is no  evidence of cardiac tamponade.   4. The mitral valve is grossly normal. No  evidence of mitral valve  regurgitation. No evidence of mitral stenosis.   5. The aortic valve is tricuspid. Aortic valve regurgitation is not  visualized. No aortic stenosis is present.   6. The inferior vena cava is normal in size with greater than 50%  respiratory variability, suggesting right atrial pressure of 3 mmHg.    Risk Assessment/Calculations:    CHA2DS2-VASc Score = 3   This indicates a 3.2% annual risk of stroke. The patient's score is based upon: CHF History: 0 HTN History: 0 Diabetes History: 0 Stroke History: 0 Vascular Disease History: 0 Age Score: 2 Gender Score: 1        ASSESSMENT:    1. Chest pain, unspecified type   2. Paroxysmal atrial fibrillation (HCC)   3. Pericardial effusion      PLAN:  In order of problems listed above:  Chest pain negative trop and EKG, echo normal LVEF but pericardial effusion. Pain felt secondary to esophageal dysmotility  PAF with RVR converted to NSR with IV dilt but hypotensive so started on multaq. Lovenox stopped due to drop in Hbg. History of bradycardia on BB. Continue Multaq for now. She missed evening dose for past 2 nights b/c of diarrhea but was also on cochicine which she is now off. I asked her to take Multaq bid and call if ongoing diarrhea. No anticoag with anemia and possible GI bleed-for GI work up in Aug.   Pericardial effusion on echo. Finished colchicine yesterday but was having diarrhea so stopped. No further chest pain. Will repeat echo in a couple weeks.    Shared Decision Making/Informed Consent        Medication Adjustments/Labs and Tests Ordered: Current medicines are reviewed at length with the patient today.  Concerns regarding medicines are outlined above.  Medication changes, Labs and Tests ordered today are listed in the Patient Instructions below. Patient Instructions  Medication Instructions:   Restart Multaq ( Take with food)   *If you need a refill on your cardiac medications  before your next appointment, please call your pharmacy*   Lab Work: NONE   If you have labs (blood work) drawn today  and your tests are completely normal, you will receive your results only by: MyChart Message (if you have MyChart) OR A paper copy in the mail If you have any lab test that is abnormal or we need to change your treatment, we will call you to review the results.   Testing/Procedures: Your physician has requested that you have an echocardiogram. Echocardiography is a painless test that uses sound waves to create images of your heart. It provides your doctor with information about the size and shape of your heart and how well your heart's chambers and valves are working. This procedure takes approximately one hour. There are no restrictions for this procedure.     Follow-Up: At [facility], you and your health needs are our priority.  As part of our continuing mission to provide you with exceptional heart care, we have created designated Provider Care Teams.  These Care Teams include your primary Cardiologist (physician) and Advanced Practice Providers (APPs -  Physician Assistants and Nurse Practitioners) who all work together to provide you with the care you need, when you need it.  We recommend signing up for the patient portal called "MyChart".  Sign up information is provided on this After Visit Summary.  MyChart is used to connect with patients for Virtual Visits (Telemedicine).  Patients are able to view lab/test results, encounter notes, upcoming appointments, etc.  Non-urgent messages can be sent to your provider as well.   To learn more about what you can do with MyChart, go to [email or web address].    Your next appointment:    December   The format for your next appointment:   In Person  Provider:   Dorris Carnes, MD    Other Instructions Thank you for choosing [facility]!    Important Information About Sugar         Sumner Boast, PA-C  10/20/2021 11:33 AM    [facility] [place], [place], [place]  [address] Phone: [phone]; Fax: [phone]

## 2021-10-08 NOTE — Care Management Obs Status (Signed)
MEDICARE OBSERVATION STATUS NOTIFICATION   Patient Details  Name: Claudia [name] MRN: [id number] Date of Birth: 09-19-1945   Medicare Observation Status Notification Given:  Yes    Boneta Lucks, RN 10/08/2021, 11:21 AM

## 2021-10-08 NOTE — Consult Note (Signed)
Referring Provider: No ref. provider found Primary Care Physician:  [name], MD Primary Gastroenterologist:  Susanne Greenhouse  Date of Admission:  Date of Consultation: 10/08/21  Reason for Consultation:  chest pain, though secondary to esophageal dysmotility  HPI:  Claudia [name] is a 76 y.o. year old female with past medical history of lung cancer status post radiation, chemotherapy and right upper lobe wedge resection, COPD, GERD, hypertension, ?Lymphocytic colitis and recent diagnosis of gastroparesis who presented to the hospital on 7/11 with c/o chest pain with inspiration that began 3-4 hours prior, described as sharp in nature.   Hospital course: EKG with presence of A fib RVR and HR in the 140s, started on Heparin and Cardizem drip in the ED. CTA without evidence of PE but showed cardiomegaly with minimal pericardial fluid and question of left apical ventricular hypoenhancement, and progressive esophageal dilation, also some question of possible fibrosis. she was transitioned to lovenox for anticoagulation. Troponins were negative. Patient converted to SR, however, previously still with ectopic atrial  rhythm. SBP became soft in the 80s, therefore cardizem was d/ced. She denied palpitations. No nausea, dizziness or lightheadedness. ECHO today was normal, cardiology recommended GI consult as continued chest pain is not thought to be cardiac related at this time.  Consult:  Patient is known to me with recent OV at the end of June. Further evaluation of previously noted pancreatic lesion with upcoming MRCP and recommendations to update colonoscopy as last was in 2013, though patient preferred to hold off on pursing colonoscopy with plans to discuss at follow up visit in 3 months.  States that she had acute sudden onset of chest pain with radiation to the back that awoke her from sleep Monday night around 3am. She does note that she had similar episode of chest pain a few weeks ago that  awoke her from sleep, however, she got up and felt that pain improved. Does report that she ate some barbecue and red vinegar slaw for dinner, as well as ice cream the night prior to chest pain starting, had about 3 hours in between dinner and lying down. Pain was not worsened with movement and she denies any recent changes in activity/pushing/pulling. Denies obvious heartburn or acid regurgitation at the time of CP onset. She describes pain as sharp, worse with inspiration, though no SOB. Appetite has been slightly better over the past week or so, improved since last GI OV in June. She denies nausea or vomiting. Reports that she may have had one or two hiccups recently but no frequent episodes like in the past. She denies any abdominal pain. . Having very occasional heartburn at home, she is not currently on at home PPI or H2B.  Chest pain has resolved. She was recently on Levaquin? And prednisone for pneumonia, finished steroids about a week ago. She denies any odynophagia or dysphagia. Unable to pinpoint if chest pain was improved with initiation of cardizem or not due to simultaneous pain medication administration.   States that she recently saw oncologist who felt that her lung cancer was still in remission, however, he was concerned for internal bleeding as Hgb was down to 9.7 from 12.7 in January, was given occult stool cards x3 to complete but has not done these yet. now 8.5 today. She denies any SOB or dizziness, she has no rectal bleeding or melena. Does endorse a little more fatigue recently. Has a BM usually every other day, no changes in stool habits.  Last EGD Jul 29, 2021 - Fluid in the esophagus. Fluid aspiration performed. - A large amount of food (residue) in the stomach. - Normal examined duodenum.  Followed by [facility] on 5/17 which revealed decreased esophageal motility     Past Medical History:  Diagnosis Date   Anemia    Arthritis    Bradycardia    mild,may be due to beta blocker  therapy   COPD (chronic obstructive pulmonary disease) (HCC)    GERD (gastroesophageal reflux disease)    otc   Hypertension 06/01/2019   pt denies   Local recurrence of lung cancer ([place]) dx'd 07/2013   rt thoracotomy chemo comp 12/2013   Lung cancer ([place]) 03/30/2008   Dr. Julien Nordmann, finished chemo, sp radiation, left upper    Lymphocytic colitis    Pneumonia     Past Surgical History:  Procedure Laterality Date   BRONCHOSCOPY  2011   DILATION AND CURETTAGE OF UTERUS     ESOPHAGOGASTRODUODENOSCOPY (EGD) WITH PROPOFOL N/A 07/29/2021   Procedure: ESOPHAGOGASTRODUODENOSCOPY (EGD) WITH PROPOFOL;  Surgeon: Harvel Quale, MD;  [facility];  Service: Gastroenterology;  Laterality: N/A;  1235 ASA 2   NECK SURGERY  1980's   THORACOTOMY Right 09/21/2013   Procedure: THORACOTOMY MAJOR;  Surgeon: Gaye Pollack, MD;  Location: [facility];  Service: Thoracic;  Laterality: Right;   UTERINE FIBROID SURGERY  2012   WEDGE RESECTION Right 09/21/2013   Procedure: RIGHT UPPER LOBE WEDGE RESECTION;  Surgeon: Gaye Pollack, MD;  Location: [facility];  Service: Thoracic;  Laterality: Right;    Prior to Admission medications   Medication Sig Start Date End Date Taking? Authorizing Provider  Calcium Carbonate Antacid (TUMS PO) Take 500-1,000 mg by mouth 3 (three) times daily as needed (indigestion/heartburn.).   Yes [provider]  Cholecalciferol (VITAMIN D-3) 25 MCG (1000 UT) CAPS Take 1,000 Units by mouth daily.   Yes [provider]  Coenzyme Q10 100 MG TABS Take 100 mg by mouth daily.   Yes [provider]  fluticasone (FLONASE) 50 MCG/ACT nasal spray Place 1 spray into both nostrils daily as needed for allergies.   Yes [provider]  Probiotic Product (PROBIOTIC DAILY PO) Take 1 capsule by mouth daily.   Yes [provider]  tretinoin (RETIN-A) 0.1 % cream Apply 1 application. topically at bedtime. 06/21/21  Yes [provider]   Turmeric Curcumin 500 MG CAPS Take 500 mg by mouth daily.   Yes [provider]  Wheat Dextrin (BENEFIBER PO) Take 1 Dose by mouth daily. One spoonful in morning coffee   Yes [provider]    Current Facility-Administered Medications  Medication Dose Route Frequency Provider Last Rate Last Admin   0.9 %  sodium chloride infusion  250 mL Intravenous PRN Barton Dubois, MD       acetaminophen (TYLENOL) tablet 650 mg  650 mg Oral Q6H PRN Barton Dubois, MD       Or   acetaminophen (TYLENOL) suppository 650 mg  650 mg Rectal Q6H PRN Barton Dubois, MD       Chlorhexidine Gluconate Cloth 2 % PADS 6 each  6 each Topical Q0600 Barton Dubois, MD   6 each at 10/08/21 0802   dronedarone (MULTAQ) tablet 400 mg  400 mg Oral BID WC Isaiah Serge, NP   400 mg at 10/08/21 0801   fluticasone (FLONASE) 50 MCG/ACT nasal spray 1 spray  1 spray Each Nare Daily PRN Barton Dubois, MD  guaiFENesin (ROBITUSSIN) 100 MG/5ML liquid 5 mL  5 mL Oral Q4H PRN Amin, Ankit Chirag, MD       hydrALAZINE (APRESOLINE) injection 10 mg  10 mg Intravenous Q4H PRN Amin, Ankit Chirag, MD       ipratropium-albuterol (DUONEB) 0.5-2.5 (3) MG/3ML nebulizer solution 3 mL  3 mL Nebulization Q4H PRN Amin, Jeanella Flattery, MD       lactated ringers infusion   Intravenous Continuous Kiel, Coralie Keens, MD       metoprolol tartrate (LOPRESSOR) injection 5 mg  5 mg Intravenous Q4H PRN Amin, Ankit Chirag, MD       ondansetron (ZOFRAN) tablet 4 mg  4 mg Oral Q6H PRN Barton Dubois, MD       Or   ondansetron [facility]) injection 4 mg  4 mg Intravenous Q6H PRN Barton Dubois, MD       oxyCODONE (Oxy IR/ROXICODONE) immediate release tablet 5 mg  5 mg Oral Q4H PRN Amin, Ankit Chirag, MD       pantoprazole (PROTONIX) EC tablet 40 mg  40 mg Oral Daily Barton Dubois, MD   40 mg at 10/08/21 0801   senna-docusate (Senokot-S) tablet 1 tablet  1 tablet Oral QHS PRN Amin, Ankit Chirag, MD       sodium chloride flush (NS) 0.9 %  injection 3 mL  3 mL Intravenous Q12H Barton Dubois, MD   3 mL at 10/08/21 0801   sodium chloride flush (NS) 0.9 % injection 3 mL  3 mL Intravenous PRN Barton Dubois, MD       traZODone (DESYREL) tablet 50 mg  50 mg Oral QHS PRN Damita Lack, MD        Allergies as of 10/07/2021 - Review Complete 10/07/2021  Allergen Reaction Noted   Azithromycin Shortness Of Breath and Rash 04/09/2011   Tussionex pennkinetic er [hydrocod poli-chlorphe poli er] Shortness Of Breath and Rash 04/09/2011    Family History  Problem Relation Age of Onset   Brain cancer Mother        brain cancer   Emphysema Father    Diabetes Son    Heart disease Paternal Grandfather    Breast cancer Maternal Aunt     Social History   Socioeconomic History   Marital status: Married    Spouse name: Not on file   Number of children: Not on file   Years of education: Not on file   Highest education level: Not on file  Occupational History   Occupation: clerical    Employer: [employer]  Tobacco Use   Smoking status: Former    Packs/day: 1.00    Years: 35.00    Total pack years: 35.00    Types: Cigarettes    Quit date: 03/31/2007    Years since quitting: 14.5    Passive exposure: Past   Smokeless tobacco: Never  Vaping Use   Vaping Use: Never used  Substance and Sexual Activity   Alcohol use: Yes    Comment: occassional about 3 drinks per year   Drug use: No   Sexual activity: Never  Other Topics Concern   Not on file  Social History Narrative   Not on file   Social Determinants of Health   Financial Resource Strain: Not on file  Food Insecurity: Not on file  Transportation Needs: Not on file  Physical Activity: Not on file  Stress: Not on file  Social Connections: Not on file  Intimate Partner Violence: Not on file   Review of  Systems: Gen: Denies fever, chills, loss of appetite, change in weight or weight loss CV: Denies chest pain, heart palpitations, syncope, edema  Resp: Denies  shortness of breath with rest, cough, wheezing GI: Denies dysphagia or odynophagia. Denies vomiting blood, jaundice, and fecal incontinence.  GU : Denies urinary burning, urinary frequency, urinary incontinence.  MS: Denies joint pain,swelling, cramping Derm: Denies rash, itching, dry skin Psych: Denies depression, anxiety,confusion, or memory loss Heme: Denies bruising, bleeding, and enlarged lymph nodes.  Physical Exam: Vital signs in last 24 hours: Temp:  [98.1 F (36.7 C)-98.7 F (37.1 C)] 98.5 F (36.9 C) (07/12 1120) Pulse Rate:  [42-99] 87 (07/12 1300) Resp:  [15-29] 24 (07/12 1300) BP: (76-124)/(30-68) 99/44 (07/12 1300) SpO2:  [92 %-100 %] 96 % (07/12 1300) Last BM Date : 10/06/21 General:   Alert,  Well-developed, well-nourished, pleasant and cooperative in NAD Head:  Normocephalic and atraumatic. Eyes:  Sclera clear, no icterus.   Conjunctiva pink. Ears:  Normal auditory acuity. Nose:  No deformity, discharge,  or lesions. Mouth:  No deformity or lesions, dentition normal. Neck:  Supple; no masses or thyromegaly. Lungs:  Clear throughout to auscultation.   No wheezes, crackles, or rhonchi. No acute distress. Heart:  Regular rate and rhythm; no murmurs, clicks, rubs,  or gallops. Abdomen:  Soft, nontender and nondistended. No masses, hepatosplenomegaly or hernias noted. Normal bowel sounds, without guarding, and without rebound.   Rectal:  Deferred until time of colonoscopy.   Msk:  Symmetrical without gross deformities. Normal posture. Pulses:  Normal pulses noted. Extremities:  Without clubbing or edema. Neurologic:  Alert and  oriented x4;  grossly normal neurologically. Skin:  Intact without significant lesions or rashes. Cervical Nodes:  No significant cervical adenopathy. Psych:  Alert and cooperative. Normal mood and affect.  Intake/Output from previous day: 07/11 0701 - 07/12 0700 In: 1027.2 [P.O.:120; I.V.:155.8; IV Piggyback:751.4] Out: 725  [Urine:725] Intake/Output this shift: Total I/O In: 120 [P.O.:120] Out: 100 [Urine:100]  Lab Results: Recent Labs    10/07/21 0546 10/08/21 0410  WBC 16.3* 17.3*  HGB 11.1* 8.5*  HCT 34.4* 26.8*  PLT 292 228   BMET Recent Labs    10/07/21 0546 10/08/21 0410  NA 138 137  K 3.6 3.6  CL 101 103  CO2 31 29  GLUCOSE 135* 91  BUN 19 15  CREATININE 0.91 0.80  CALCIUM 9.7 9.0   LFT Recent Labs    10/07/21 0546  PROT 7.3  ALBUMIN 3.2*  AST 16  ALT 27  ALKPHOS 101  BILITOT 0.8   PT/INR No results for input(s): "LABPROT", "INR" in the last 72 hours. Hepatitis Panel No results for input(s): "HEPBSAG", "HCVAB", "HEPAIGM", "HEPBIGM" in the last 72 hours. C-Diff No results for input(s): "CDIFFTOX" in the last 72 hours.  Studies/Results: ECHOCARDIOGRAM COMPLETE  Result Date: 10/08/2021    ECHOCARDIOGRAM REPORT   Patient Name:   MELIYAH SIMON Date of Exam: 10/08/2021 Medical Rec #:  [id number]       Height:       62.0 in Accession #:    [id number]      Weight:       108.7 lb Date of Birth:  1945-08-15      BSA:          1.475 m Patient Age:    76 years        BP:           88/36 mmHg Patient Gender: F  HR:           79 bpm. Exam Location:  [facility] Procedure: 2D Echo, Cardiac Doppler and Color Doppler Indications:    Chest Pain  History:        Patient has prior history of Echocardiogram examinations, most                 recent 04/10/2011. COPD, Arrythmias:Atrial Fibrillation,                 Signs/Symptoms:Chest Pain; Risk Factors:Hypertension. Lung CA.  Sonographer:    Wenda Low Referring Phys: Holly  1. Left ventricular ejection fraction, by estimation, is 60 to 65%. The left ventricle has normal function. The left ventricle has no regional wall motion abnormalities. Left ventricular diastolic parameters were normal.  2. Right ventricular systolic function is normal. The right ventricular size is normal. There is normal pulmonary  artery systolic pressure. The estimated right ventricular systolic pressure is 57.3 mmHg.  3. Moderate pericardial effusion. The pericardial effusion is posterior to the left ventricle and anterior to the right ventricle. There is no evidence of cardiac tamponade.  4. The mitral valve is grossly normal. No evidence of mitral valve regurgitation. No evidence of mitral stenosis.  5. The aortic valve is tricuspid. Aortic valve regurgitation is not visualized. No aortic stenosis is present.  6. The inferior vena cava is normal in size with greater than 50% respiratory variability, suggesting right atrial pressure of 3 mmHg. FINDINGS  Left Ventricle: Left ventricular ejection fraction, by estimation, is 60 to 65%. The left ventricle has normal function. The left ventricle has no regional wall motion abnormalities. The left ventricular internal cavity size was normal in size. There is  no left ventricular hypertrophy. Left ventricular diastolic parameters were normal. Right Ventricle: The right ventricular size is normal. No increase in right ventricular wall thickness. Right ventricular systolic function is normal. There is normal pulmonary artery systolic pressure. The tricuspid regurgitant velocity is 2.70 m/s, and  with an assumed right atrial pressure of 3 mmHg, the estimated right ventricular systolic pressure is 22.0 mmHg. Left Atrium: Left atrial size was normal in size. Right Atrium: Right atrial size was normal in size. Pericardium: A moderately sized pericardial effusion is present. The pericardial effusion is posterior to the left ventricle and anterior to the right ventricle. There is no evidence of cardiac tamponade. Presence of epicardial fat layer. Mitral Valve: The mitral valve is grossly normal. No evidence of mitral valve regurgitation. No evidence of mitral valve stenosis. MV peak gradient, 3.0 mmHg. The mean mitral valve gradient is 1.0 mmHg. Tricuspid Valve: The tricuspid valve is grossly normal.  Tricuspid valve regurgitation is trivial. No evidence of tricuspid stenosis. Aortic Valve: The aortic valve is tricuspid. Aortic valve regurgitation is not visualized. No aortic stenosis is present. Aortic valve mean gradient measures 2.7 mmHg. Aortic valve peak gradient measures 5.0 mmHg. Aortic valve area, by VTI measures 2.35 cm. Pulmonic Valve: The pulmonic valve was grossly normal. Pulmonic valve regurgitation is trivial. No evidence of pulmonic stenosis. Aorta: The aortic root is normal in size and structure. Venous: The inferior vena cava is normal in size with greater than 50% respiratory variability, suggesting right atrial pressure of 3 mmHg. IAS/Shunts: The atrial septum is grossly normal.  LEFT VENTRICLE PLAX 2D LVIDd:         4.80 cm   Diastology LVIDs:         2.90 cm   LV e'  medial:    8.70 cm/s LV PW:         0.80 cm   LV E/e' medial:  7.7 LV IVS:        1.10 cm   LV e' lateral:   7.40 cm/s LVOT diam:     1.90 cm   LV E/e' lateral: 9.1 LV SV:         49 LV SV Index:   34 LVOT Area:     2.84 cm  RIGHT VENTRICLE RV Basal diam:  1.55 cm RV Mid diam:    1.80 cm RV S prime:     10.30 cm/s TAPSE (M-mode): 2.0 cm LEFT ATRIUM             Index        RIGHT ATRIUM           Index LA diam:        2.80 cm 1.90 cm/m   RA Area:     11.30 cm LA Vol (A2C):   45.8 ml 31.05 ml/m  RA Volume:   23.70 ml  16.07 ml/m LA Vol (A4C):   27.2 ml 18.44 ml/m LA Biplane Vol: 35.3 ml 23.93 ml/m  AORTIC VALVE                    PULMONIC VALVE AV Area (Vmax):    2.68 cm     PV Vmax:       0.83 m/s AV Area (Vmean):   2.44 cm     PV Peak grad:  2.8 mmHg AV Area (VTI):     2.35 cm AV Vmax:           111.67 cm/s AV Vmean:          74.867 cm/s AV VTI:            0.210 m AV Peak Grad:      5.0 mmHg AV Mean Grad:      2.7 mmHg LVOT Vmax:         105.50 cm/s LVOT Vmean:        64.400 cm/s LVOT VTI:          0.174 m LVOT/AV VTI ratio: 0.83  AORTA Ao Root diam: 3.20 cm MITRAL VALVE               TRICUSPID VALVE MV Area (PHT): 3.00  cm    TR Peak grad:   29.2 mmHg MV Area VTI:   1.75 cm    TR Vmax:        270.00 cm/s MV Peak grad:  3.0 mmHg MV Mean grad:  1.0 mmHg    SHUNTS MV Vmax:       0.86 m/s    Systemic VTI:  0.17 m MV Vmean:      46.3 cm/s   Systemic Diam: 1.90 cm MV Decel Time: 253 msec MV E velocity: 67.30 cm/s MV A velocity: 65.60 cm/s MV E/A ratio:  1.03 Eleonore Chiquito MD Electronically signed by Eleonore Chiquito MD Signature Date/Time: 10/08/2021/10:13:27 AM    Final    CT Angio Chest Pulmonary Embolism (PE) W or WO Contrast  Addendum Date: 10/07/2021   ADDENDUM REPORT: 10/07/2021 08:58 ADDENDUM: Not mentioned in the impressions is possible left apical ventricular hypoenhancement. Correlate with myocardial ischemic symptoms. Electronically Signed   By: Abigail Miyamoto M.D.   On: 10/07/2021 08:58   Result Date: 10/07/2021 CLINICAL DATA:  Anterior chest pain with inspiration. Status post wedge resection  in 2015 for lung cancer. COPD. History of small-cell lung cancer. * Tracking Code: BO * EXAM: CT ANGIOGRAPHY CHEST WITH CONTRAST TECHNIQUE: Multidetector CT imaging of the chest was performed using the standard protocol during bolus administration of intravenous contrast. Multiplanar CT image reconstructions and MIPs were obtained to evaluate the vascular anatomy. RADIATION DOSE REDUCTION: This exam was performed according to the departmental dose-optimization program which includes automated exposure control, adjustment of the mA and/or kV according to patient size and/or use of iterative reconstruction technique. CONTRAST:  15mL OMNIPAQUE IOHEXOL 350 MG/ML SOLN COMPARISON:  Chest radiograph 10/07/2021.  Chest CT of 10/02/2021. FINDINGS: Cardiovascular: The quality of this exam for evaluation of pulmonary embolism is good. No evidence of pulmonary embolism. Bovine arch. Advanced aortic and branch vessel atherosclerosis. Narrowing of the origin of the left carotid artery. Mild cardiomegaly with minimal anterior pericardial fluid.  Question of left apical ventricular hypoenhancement on 72/9. Mediastinum/Nodes: No mediastinal or hilar adenopathy. Progressive esophageal dilatation with debris within. Lungs/Pleura: Circumferential left-sided pleural thickening and small volume left-sided pleural fluid are similar. Trace right pleural fluid is new. Moderate centrilobular emphysema. Again identified are clustered nodules within the anterior right upper lobe, likely post infectious/inflammatory. Right lower lobe 4 mm pulmonary nodule on 94/11 is unchanged. Left upper lung and perihilar consolidation, architectural distortion, traction bronchiectasis again identified. When comparing to the 04/02/2021 exam, there is increased anterior left lung soft tissue density with small volume extra alveolar gas including on 46/11 and 31/9. Upper Abdomen: Normal imaged portions of the liver, spleen, stomach, pancreas, gallbladder, adrenal glands. Upper pole left renal 1.8 cm low-density lesion is likely a cyst but is incompletely imaged. An upper pole right renal 8 mm complex lesion had decreased in size on the prior exam, favoring a hemorrhagic cyst. Musculoskeletal: Mild osteopenia. Review of the MIP images confirms the above findings. IMPRESSION: 1.  No evidence of pulmonary embolism. 2. Presumed radiation induced consolidation within the perihilar and upper left lung. When compared to the January exam, increased soft tissue density anteriorly with minimal new extraalveolar gas. Presuming the patient has not had recent radiation therapy to suggest developing fibrosis, consider PET to exclude recurrent disease. 3. Similar small left and new trace right pleural effusions. 4. Progressive esophageal dilatation with debris within. Dysmotility and/or gastroesophageal reflux. Correlate with endoscopy of 07/29/2021. 5. Aortic atherosclerosis (ICD10-I70.0), coronary artery atherosclerosis and emphysema (ICD10-J43.9). Electronically Signed: By: Abigail Miyamoto M.D. On:  10/07/2021 08:41   DG Chest Port 1 View  Result Date: 10/07/2021 CLINICAL DATA:  Chest pain and shortness of breath. History of lung cancer. EXAM: PORTABLE CHEST 1 VIEW COMPARISON:  Chest CT with contrast 10/02/2021, PA and lateral chest 09/11/2021 FINDINGS: Grossly stable appearance of left-sided volume loss, posttreatment related upper lobe fibrosis with perihilar spiculation, and tenting along the left hemidiaphragm. Small left pleural effusion also appears similar. There previously was airspace disease along the superior aspect of the perihilar fibrosis which is not seen today. No infiltrate like abnormality is seen. The right lung is clear with COPD and postsurgical change in the upper lobe. The cardiac size is normal. There is aortic calcification with stable mediastinal configuration. There is osteopenia with degenerative changes of the spine. IMPRESSION: Left-sided postsurgical change, volume loss and treatment related fibrosis limit evaluation of the upper lung field. Elsewhere no acute process is suspected. COPD. Electronically Signed   By: Telford Nab M.D.   On: 10/07/2021 06:40    Impression: Claudia [name] is a 76 y.o. year  old female with past medical history of lung cancer status post radiation, chemotherapy and right upper lobe wedge resection, COPD, GERD, hypertension, ?Lymphocytic colitis and recent diagnosis of gastroparesis who presented to the hospital on 7/11 with c/o chest pain with inspiration that began 3-4 hours prior, described as sharp in nature, cardiac workup overall has been unremarkable, requesting GI evaluation to determine if chest pain is GI related.  Chest pain/gastroparesis/esophageal dysmotility:  sudden onset of central, sharp chest pain with radiation to back on Monday night, reportedly had eaten barbecue and red vinegar slaw for dinner around 6-7 hours prior to onset. Cardiology evaluation was unrevealing for definitive cause of chest pain. CT A/P this admission  with progressive esophageal dilation, most recent EGD in May 2023 with fluid in esophagus and food residue in stomach, previously with hiccups likely secondary to recently diagnosed gastroparesis, hiccups have since decreased with occurrence of only 1-2 here and there. She is not on any prokinetic agents currently as she did not previously respond to metoclopramide and felt that symptoms were much improved at last OV. No nausea or vomiting and appetite has continued to improve.  Has occasional heartburn or acid regurgitation and is not on outpatient PPI or H2B. Recent course of Levaquin? And prednisone for pneumonia, though she denies any dysphagia or odynophagia. Notably she was on cardizem drip until yesterday for new onset A fib with RVR, with resolution of symptoms after, could consider esophageal spasm as a differential in setting of known esophageal dysmotility/gastroparesis which certainly could have resolved secondary to administration of CCB, however, chest pain most likely secondary to GERD, At this time, will maintain her on PPI therapy daily with reflux precautions, consider increasing to BID if pain becomes more frequent, can advancing diet as tolerated.   Normocytic Anemia: hgb down to 8.5, normal at 12.7 six months ago, though noted to be low at 9.7 with oncologist on 10/02/21. MCV remains normal at 92.4. no overt rectal bleeding or Melena. No colonoscopy since 2013. Denies any change in stool habits, NSAID use, nausea, early satiety or abdominal pain. Has felt a little more fatigued recently but denies dizziness or SOB. Notably with some weight loss and decrease intake over the past few months, thought secondary to recent gastroparesis diagnosis, though appetite has been improving over the past week. Will check FOBT, iron studies, folate and B12. Colonoscopy was previously discussed during outpatient GI OV, will continue to plan for outpatient colonoscopy unless hemoglobin continues to drop  significantly or she has overt GI bleeding.   Plan: PPI daily with reflux precautions Check FOBT Iron studies, b12, folate Colonoscopy on outpatient basis Trend H&H Monitor for overt GI bleeding  Advance to soft diet today   LOS: 1 day    10/08/2021, 1:42 PM   Camillia Marcy L. Alver Sorrow, MSN, APRN, AGNP-C Adult-Gerontology Nurse Practitioner [facility]

## 2021-10-08 NOTE — Progress Notes (Signed)
Cardiology Progress Note  Patient ID: Claudia [name] MRN: [id number] DOB: 1945/11/16 Date of Encounter: 10/08/2021  Primary Cardiologist: None  Subjective   Chief Complaint: None.   HPI: Maintaining sinus rhythm.  Brief and insignificant A-fib this morning.  ROS:  All other ROS reviewed and negative. Pertinent positives noted in the HPI.     Inpatient Medications  Scheduled Meds:  Chlorhexidine Gluconate Cloth  6 each Topical Q0600   dronedarone  400 mg Oral BID WC   enoxaparin (LOVENOX) injection  50 mg Subcutaneous Q12H   pantoprazole  40 mg Oral Daily   sodium chloride flush  3 mL Intravenous Q12H   Continuous Infusions:  sodium chloride     lactated ringers     PRN Meds: sodium chloride, acetaminophen **OR** acetaminophen, fluticasone, ondansetron **OR** ondansetron (ZOFRAN) IV, sodium chloride flush   Vital Signs   Vitals:   10/08/21 0745 10/08/21 0800 10/08/21 0821 10/08/21 0830  BP: (!) 120/37 (!) 86/36 (!) 100/54 (!) 101/45  Pulse: 97 90 96 (!) 42  Resp: (!) 22 18 (!) 26 (!) 23  Temp:      TempSrc:      SpO2: 95% 95% 96% 95%  Weight:      Height:        Intake/Output Summary (Last 24 hours) at 10/08/2021 0849 Last data filed at 10/08/2021 0826 Gross per 24 hour  Intake 1147.2 ml  Output 725 ml  Net 422.2 ml      10/07/2021   12:03 PM 10/07/2021    4:45 AM 10/06/2021   11:00 AM  Last 3 Weights  Weight (lbs) 108 lb 11 oz 110 lb 110 lb  Weight (kg) 49.3 kg 49.896 kg 49.896 kg      Telemetry  Overnight telemetry shows SR 90-100 bpm, which I personally reviewed.   ECG  The most recent ECG shows normal sinus rhythm heart rate 100, no acute ischemic changes or evidence of infarction, which I personally reviewed.   Physical Exam   Vitals:   10/08/21 0745 10/08/21 0800 10/08/21 0821 10/08/21 0830  BP: (!) 120/37 (!) 86/36 (!) 100/54 (!) 101/45  Pulse: 97 90 96 (!) 42  Resp: (!) 22 18 (!) 26 (!) 23  Temp:      TempSrc:      SpO2: 95% 95% 96% 95%   Weight:      Height:        Intake/Output Summary (Last 24 hours) at 10/08/2021 0849 Last data filed at 10/08/2021 0826 Gross per 24 hour  Intake 1147.2 ml  Output 725 ml  Net 422.2 ml       10/07/2021   12:03 PM 10/07/2021    4:45 AM 10/06/2021   11:00 AM  Last 3 Weights  Weight (lbs) 108 lb 11 oz 110 lb 110 lb  Weight (kg) 49.3 kg 49.896 kg 49.896 kg    Body mass index is 19.88 kg/m.  General: Well nourished, well developed, in no acute distress Head: Atraumatic, normal size  Eyes: PEERLA, EOMI  Neck: Supple, no JVD Endocrine: No thryomegaly Cardiac: Normal S1, S2; RRR; no murmurs, rubs, or gallops Lungs: Diminished breath sounds bilaterally Abd: Soft, nontender, no hepatomegaly  Ext: No edema, pulses 2+ Musculoskeletal: No deformities, BUE and BLE strength normal and equal Skin: Warm and dry, no rashes   Neuro: Alert and oriented to person, place, time, and situation, CNII-XII grossly intact, no focal deficits  Psych: Normal mood and affect   Labs  High Sensitivity Troponin:  Recent Labs  Lab 10/07/21 0546 10/07/21 0811  TROPONINIHS 5 4     Cardiac EnzymesNo results for input(s): "TROPONINI" in the last 168 hours. No results for input(s): "TROPIPOC" in the last 168 hours.  Chemistry Recent Labs  Lab 10/02/21 1051 10/07/21 0546 10/08/21 0410  NA 139 138 137  K 4.2 3.6 3.6  CL 102 101 103  CO2 32 31 29  GLUCOSE 90 135* 91  BUN 23 19 15   CREATININE 0.83 0.91 0.80  CALCIUM 10.4* 9.7 9.0  PROT 7.1 7.3  --   ALBUMIN 3.3* 3.2*  --   AST 79* 16  --   ALT 60* 27  --   ALKPHOS 105 101  --   BILITOT 0.3 0.8  --   GFRNONAA >60 >60 >60  ANIONGAP 5 6 5     Hematology Recent Labs  Lab 10/02/21 1051 10/07/21 0546 10/08/21 0410  WBC 9.5 16.3* 17.3*  RBC 3.33* 3.78* 2.90*  HGB 9.7* 11.1* 8.5*  HCT 29.8* 34.4* 26.8*  MCV 89.5 91.0 92.4  MCH 29.1 29.4 29.3  MCHC 32.6 32.3 31.7  RDW 14.7 15.9* 16.2*  PLT 245 292 228   BNPNo results for input(s): "BNP",  "PROBNP" in the last 168 hours.  DDimer No results for input(s): "DDIMER" in the last 168 hours.   Radiology  CT Angio Chest Pulmonary Embolism (PE) W or WO Contrast  Addendum Date: 10/07/2021   ADDENDUM REPORT: 10/07/2021 08:58 ADDENDUM: Not mentioned in the impressions is possible left apical ventricular hypoenhancement. Correlate with myocardial ischemic symptoms. Electronically Signed   By: Abigail Miyamoto M.D.   On: 10/07/2021 08:58   Result Date: 10/07/2021 CLINICAL DATA:  Anterior chest pain with inspiration. Status post wedge resection in 2015 for lung cancer. COPD. History of small-cell lung cancer. * Tracking Code: BO * EXAM: CT ANGIOGRAPHY CHEST WITH CONTRAST TECHNIQUE: Multidetector CT imaging of the chest was performed using the standard protocol during bolus administration of intravenous contrast. Multiplanar CT image reconstructions and MIPs were obtained to evaluate the vascular anatomy. RADIATION DOSE REDUCTION: This exam was performed according to the departmental dose-optimization program which includes automated exposure control, adjustment of the mA and/or kV according to patient size and/or use of iterative reconstruction technique. CONTRAST:  90mL OMNIPAQUE IOHEXOL 350 MG/ML SOLN COMPARISON:  Chest radiograph 10/07/2021.  Chest CT of 10/02/2021. FINDINGS: Cardiovascular: The quality of this exam for evaluation of pulmonary embolism is good. No evidence of pulmonary embolism. Bovine arch. Advanced aortic and branch vessel atherosclerosis. Narrowing of the origin of the left carotid artery. Mild cardiomegaly with minimal anterior pericardial fluid. Question of left apical ventricular hypoenhancement on 72/9. Mediastinum/Nodes: No mediastinal or hilar adenopathy. Progressive esophageal dilatation with debris within. Lungs/Pleura: Circumferential left-sided pleural thickening and small volume left-sided pleural fluid are similar. Trace right pleural fluid is new. Moderate centrilobular  emphysema. Again identified are clustered nodules within the anterior right upper lobe, likely post infectious/inflammatory. Right lower lobe 4 mm pulmonary nodule on 94/11 is unchanged. Left upper lung and perihilar consolidation, architectural distortion, traction bronchiectasis again identified. When comparing to the 04/02/2021 exam, there is increased anterior left lung soft tissue density with small volume extra alveolar gas including on 46/11 and 31/9. Upper Abdomen: Normal imaged portions of the liver, spleen, stomach, pancreas, gallbladder, adrenal glands. Upper pole left renal 1.8 cm low-density lesion is likely a cyst but is incompletely imaged. An upper pole right renal 8 mm complex lesion had decreased in size on the prior exam,  favoring a hemorrhagic cyst. Musculoskeletal: Mild osteopenia. Review of the MIP images confirms the above findings. IMPRESSION: 1.  No evidence of pulmonary embolism. 2. Presumed radiation induced consolidation within the perihilar and upper left lung. When compared to the January exam, increased soft tissue density anteriorly with minimal new extraalveolar gas. Presuming the patient has not had recent radiation therapy to suggest developing fibrosis, consider PET to exclude recurrent disease. 3. Similar small left and new trace right pleural effusions. 4. Progressive esophageal dilatation with debris within. Dysmotility and/or gastroesophageal reflux. Correlate with endoscopy of 07/29/2021. 5. Aortic atherosclerosis (ICD10-I70.0), coronary artery atherosclerosis and emphysema (ICD10-J43.9). Electronically Signed: By: Abigail Miyamoto M.D. On: 10/07/2021 08:41   DG Chest Port 1 View  Result Date: 10/07/2021 CLINICAL DATA:  Chest pain and shortness of breath. History of lung cancer. EXAM: PORTABLE CHEST 1 VIEW COMPARISON:  Chest CT with contrast 10/02/2021, PA and lateral chest 09/11/2021 FINDINGS: Grossly stable appearance of left-sided volume loss, posttreatment related upper  lobe fibrosis with perihilar spiculation, and tenting along the left hemidiaphragm. Small left pleural effusion also appears similar. There previously was airspace disease along the superior aspect of the perihilar fibrosis which is not seen today. No infiltrate like abnormality is seen. The right lung is clear with COPD and postsurgical change in the upper lobe. The cardiac size is normal. There is aortic calcification with stable mediastinal configuration. There is osteopenia with degenerative changes of the spine. IMPRESSION: Left-sided postsurgical change, volume loss and treatment related fibrosis limit evaluation of the upper lung field. Elsewhere no acute process is suspected. COPD. Electronically Signed   By: Telford Nab M.D.   On: 10/07/2021 06:40    Cardiac Studies  Echo pending  Patient Profile  Claudia [name] is a 76 y.o. female with small cell lung cancer, COPD, GERD who was admitted on 10/07/2021 with chest pain and atrial fibrillation with RVR.  Assessment & Plan   #Chest pain, noncardiac -Describes pressure in her chest.  No coronary calcium on chest CT.  EKG nonischemic.  Troponins are negative. -Chest CT mentions hypoenhancement of the left ventricle.  On my review this is a nongated study.  Very difficult to make that call.  Echo is pending. -Her chest CT also shows progressive esophageal dilation.  She has a known history of esophageal dysmotility and gastroparesis.  I suspect this is the main cause of her chest symptoms. -Also mention on chest CT of radiation-induced consolidation in the lungs.  There is also mention of possible fibrosis.  This could also explain her chest symptomology.  Also possible recurrence of cancer. -To me her chest pain is noncardiac and likely multifactorial in setting of esophageal dysmotility as well as lung injury from prior radiation.  If her echo is normal would recommend no further work-up. -Would strongly consider GI evaluation for treatment of  her esophageal dysmotility.  #Atrial fibrillation with RVR -She reports she is losing weight.  She has poor eating pattern.  Suspect this is why she has not been tolerant of beta-blockers or calcium channel blockers. -TSH normal. Echo pending.  -Placed on Multaq 400 mg twice daily.  We will continue this dose for rhythm control strategy.  Seems to be working.  No history of congestive heart failure to preclude her from this medication. -CHADSVASC=3 (age, female).  Anticoagulation is indicated however given drop in hemoglobin would recommend against anticoagulation at this time.  She will need evaluation of her anemia. -CT scan with mention of possible recurrence of  lung cancer.  Also concern for pancreatic mass.  I will defer work-up of this to hospital medicine. -Given anemia and drop in hemoglobin I have stopped her Lovenox.  If hemoglobin stabilizes and we are not concerned about possible GI bleed would then recommend anticoagulation however in this acute setting with drop in hemoglobin I would advise against this.  I have stopped her Lovenox.  #Anemia -Hemoglobin dropped overnight.  Would hold anticoagulation.  Further work-up per hospital medicine team.  #Esophageal Dysmotility #Gastroparesis -Suspect this explains a lot of her chest symptoms.  Further evaluation by GI if clinically indicated.  #Leukocytosis -Further work-up of infectious source per hospital medicine.  For questions or updates, please contact [place] Please consult [email or web address] for contact info under   Signed, [place] Bells T. Audie Box, MD, [place]  10/08/2021 8:49 AM

## 2021-10-08 NOTE — TOC Progression Note (Signed)
  Transition of Care [facility]) Screening Note   Patient Details  Name: Claudia [name] Date of Birth: December 25, 1945   Transition of Care [facility]) CM/SW Contact:    Boneta Lucks, RN Phone Number: 10/08/2021, 12:43 PM    Transition of Care Department [facility]) has reviewed patient and no TOC needs have been identified at this time. We will continue to monitor patient advancement through interdisciplinary progression rounds. If new patient transition needs arise, please place a TOC consult.    Expected Discharge Plan: Home/Self Care Barriers to Discharge: Continued Medical Work up  Expected Discharge Plan and Services Expected Discharge Plan: Home/Self Care      Living arrangements for the past 2 months: [place]

## 2021-10-08 NOTE — Progress Notes (Signed)
Report given to Lauren, LPN on Dept 211. Pt to be wheeled to room 301 via WC.

## 2021-10-08 NOTE — Care Management CC44 (Signed)
Condition Code 44 Documentation Completed  Patient Details  Name: Claudia [name] MRN: [id number] Date of Birth: 05/08/1945   Condition Code 44 given:  Yes Patient signature on Condition Code 44 notice:  Yes Documentation of 2 MD's agreement:  Yes Code 44 added to claim:  Yes    Boneta Lucks, RN 10/08/2021, 11:21 AM

## 2021-10-09 ENCOUNTER — Telehealth: Payer: Self-pay | Admitting: Gastroenterology

## 2021-10-09 DIAGNOSIS — R079 Chest pain, unspecified: Secondary | ICD-10-CM | POA: Diagnosis not present

## 2021-10-09 DIAGNOSIS — C3490 Malignant neoplasm of unspecified part of unspecified bronchus or lung: Secondary | ICD-10-CM | POA: Diagnosis not present

## 2021-10-09 DIAGNOSIS — I3139 Other pericardial effusion (noninflammatory): Secondary | ICD-10-CM | POA: Diagnosis not present

## 2021-10-09 DIAGNOSIS — I10 Essential (primary) hypertension: Secondary | ICD-10-CM | POA: Diagnosis not present

## 2021-10-09 DIAGNOSIS — I4891 Unspecified atrial fibrillation: Secondary | ICD-10-CM | POA: Diagnosis not present

## 2021-10-09 LAB — BASIC METABOLIC PANEL
Anion gap: 4 — ABNORMAL LOW (ref 5–15)
BUN: 11 mg/dL (ref 8–23)
CO2: 27 mmol/L (ref 22–32)
Calcium: 9.1 mg/dL (ref 8.9–10.3)
Chloride: 104 mmol/L (ref 98–111)
Creatinine, Ser: 0.78 mg/dL (ref 0.44–1.00)
GFR, Estimated: >60 mL/min (ref >60)
Glucose, Bld: 123 mg/dL — ABNORMAL HIGH (ref 70–99)
Potassium: 3.8 mmol/L (ref 3.5–5.1)
Sodium: 135 mmol/L (ref 135–145)

## 2021-10-09 LAB — PROCALCITONIN: Procalcitonin: 0.46 ng/mL

## 2021-10-09 LAB — CBC
HCT: 27.2 % — ABNORMAL LOW (ref 36.0–46.0)
Hemoglobin: 8.5 g/dL — ABNORMAL LOW (ref 12.0–15.0)
MCH: 29.2 pg (ref 26.0–34.0)
MCHC: 31.3 g/dL (ref 30.0–36.0)
MCV: 93.5 fL (ref 80.0–100.0)
Platelets: 230 10*3/uL (ref 150–400)
RBC: 2.91 MIL/uL — ABNORMAL LOW (ref 3.87–5.11)
RDW: 16 % — ABNORMAL HIGH (ref 11.5–15.5)
WBC: 16 10*3/uL — ABNORMAL HIGH (ref 4.0–10.5)
nRBC: 0 % (ref 0.0–0.2)

## 2021-10-09 LAB — SEDIMENTATION RATE: Sed Rate: 108 mm/hr — ABNORMAL HIGH (ref 0–22)

## 2021-10-09 LAB — MAGNESIUM: Magnesium: 1.8 mg/dL (ref 1.7–2.4)

## 2021-10-09 MED ORDER — SENNOSIDES-DOCUSATE SODIUM 8.6-50 MG PO TABS: 1.0000 | ORAL_TABLET | Freq: Every evening | ORAL | 0 refills | Status: DC | PRN

## 2021-10-09 MED ORDER — NA FERRIC GLUC CPLX IN SUCROSE 12.5 MG/ML IV SOLN
250.0000 mg | Freq: Every day | INTRAVENOUS | Status: DC
  Administered 2021-10-09: 250 mg via INTRAVENOUS
  Filled 2021-10-09 (×2): qty 20

## 2021-10-09 MED ORDER — PANTOPRAZOLE SODIUM 40 MG PO TBEC: 40.0000 mg | DELAYED_RELEASE_TABLET | Freq: Every day | ORAL | 0 refills | Status: DC

## 2021-10-09 MED ORDER — FERROUS SULFATE 325 (65 FE) MG PO TABS: 325.0000 mg | ORAL_TABLET | Freq: Every day | ORAL | 3 refills | Status: DC

## 2021-10-09 MED ORDER — COLCHICINE 0.6 MG PO TABS
0.6000 mg | ORAL_TABLET | Freq: Two times a day (BID) | ORAL | 0 refills | Status: DC
Start: 2021-10-09 — End: 2022-02-27

## 2021-10-09 MED ORDER — DRONEDARONE HCL 400 MG PO TABS: 400.0000 mg | ORAL_TABLET | Freq: Two times a day (BID) | ORAL | 3 refills | Status: DC

## 2021-10-09 MED ORDER — COLCHICINE 0.6 MG PO TABS
0.6000 mg | ORAL_TABLET | Freq: Two times a day (BID) | ORAL | Status: DC
  Administered 2021-10-09: 0.6 mg via ORAL
  Filled 2021-10-09: qty 1

## 2021-10-09 NOTE — Evaluation (Signed)
Physical Therapy Evaluation Patient Details Name: Claudia [name] MRN: [id number] DOB: 1945-07-18 Today's Date: 10/09/2021  History of Present Illness  Claudia [name] is a 75 y.o. female with medical history significant of small cell lung cancer, gastroesophageal reflux disease, COPD, hypertension and dyslipidemia; who presented to the hospital secondary to shortness of breath and chest discomfort.  Patient reports chest pain symptom presented suddenly, worsening with deep breath, sharp in nature with associated shortness of breath.  No fever, no nausea, no vomiting.  Patient reports no dysuria, abdominal pain, focal neurologic deficits, sick contacts or any other complaints.  At time of admission work-up demonstrated A-fib with RVR requiring initiation of insulin drip and IV heparin.   Clinical Impression  Patient presents supine in bed and consents to PT evaluation. Patient is independent with bed mobility demonstrating appropriate trunk control and strength needed to complete task. Patient is independent with transfers and no AD. Demonstrates good balance and coordination. Patient was able to ambulate 200 feet in hallway independently with no AD. Mild gait deviations observed but gait pattern close to WNL indicating good return for home and PLOF. Slightly decreased gait speed observed. Patient functioning near/at baseline for functional mobility and gait. Patient tolerated sitting up in chair after therapy with nursing staff notified of mobility status. PLAN:  Patient to be discharged home today and discharged from acute physical therapy to care of nursing for ambulation as tolerated for length of stay with recommendations stated below      Recommendations for follow up therapy are one component of a multi-disciplinary discharge planning process, led by the attending physician.  Recommendations may be updated based on patient status, additional functional criteria and insurance  authorization.  Follow Up Recommendations No PT follow up      Assistance Recommended at Discharge PRN  Patient can return home with the following  A little help with walking and/or transfers;Help with stairs or ramp for entrance    Equipment Recommendations None recommended by PT  Recommendations for Other Services       Functional Status Assessment Patient has not had a recent decline in their functional status     Precautions / Restrictions Precautions Precautions: None Restrictions Weight Bearing Restrictions: No      Mobility  Bed Mobility Overal bed mobility: Independent             General bed mobility comments: Patient able to perform supine to sit independently. Appropriate trunk control and strength demonstrated.    Transfers Overall transfer level: Independent Equipment used: None               General transfer comment: Independent with transfers. Good balance and coordination.    Ambulation/Gait Ambulation/Gait assistance: Independent Gait Distance (Feet): 200 Feet Assistive device: None Gait Pattern/deviations: Step-to pattern, Decreased step length - left, Decreased step length - right, Decreased stride length, Trunk flexed Gait velocity: slightly decreased     General Gait Details: Able to ambulate 200 feet in hallway independently with no AD. Mild gait deviations observed but gait pattern close to WNL. Slighlty decreased gait speed.  Stairs            Wheelchair Mobility    Modified Rankin (Stroke Patients Only)       Balance Overall balance assessment: Independent  Pertinent Vitals/Pain Pain Assessment Pain Assessment: No/denies pain    Home Living Family/patient expects to be discharged to:: Private residence Living Arrangements: Spouse/significant other Available Help at Discharge: Family;Available PRN/intermittently;Available 24 hours/day Type of Home:  House Home Access: Stairs to enter Entrance Stairs-Rails: None Entrance Stairs-Number of Steps: 2   Home Layout: Multi-level;Able to live on main level with bedroom/bathroom Home Equipment: Shower seat;Toilet riser      Prior Function Prior Level of Function : Independent/Modified Independent             Mobility Comments: Patient reports being able to ambulate at home and in the community independent with no AD. Currently driving. ADLs Comments: Patient reports being independent with ADL's.     Hand Dominance        Extremity/Trunk Assessment   Upper Extremity Assessment Upper Extremity Assessment: Overall WFL for tasks assessed    Lower Extremity Assessment Lower Extremity Assessment: Overall WFL for tasks assessed    Cervical / Trunk Assessment Cervical / Trunk Assessment: Normal  Communication   Communication: No difficulties  Cognition Arousal/Alertness: Awake/alert Behavior During Therapy: WFL for tasks assessed/performed Overall Cognitive Status: Within Functional Limits for tasks assessed                                          General Comments      Exercises     Assessment/Plan    PT Assessment Patient does not need any further PT services  PT Problem List         PT Treatment Interventions      PT Goals (Current goals can be found in the Care Plan section)  Acute Rehab PT Goals Patient Stated Goal: return home PT Goal Formulation: With patient/family Time For Goal Achievement: 10/09/21 Potential to Achieve Goals: Good    Frequency       Co-evaluation               AM-PAC PT "6 Clicks" Mobility  Outcome Measure Help needed turning from your back to your side while in a flat bed without using bedrails?: None Help needed moving from lying on your back to sitting on the side of a flat bed without using bedrails?: None Help needed moving to and from a bed to a chair (including a wheelchair)?: None Help needed  standing up from a chair using your arms (e.g., wheelchair or bedside chair)?: None Help needed to walk in hospital room?: A Little Help needed climbing 3-5 steps with a railing? : A Little 6 Click Score: 22    End of Session   Activity Tolerance: Patient tolerated treatment well;No increased pain Patient left: in bed;with family/visitor present Nurse Communication: Mobility status PT Visit Diagnosis: Unsteadiness on feet (R26.81);Other abnormalities of gait and mobility (R26.89);Muscle weakness (generalized) (M62.81)    Time: 7290-2111 PT Time Calculation (min) (ACUTE ONLY): 28 min   Charges:   PT Evaluation $PT Eval Moderate Complexity: 1 Mod PT Treatments $Therapeutic Activity: 23-37 mins        12:23 PM, 10/09/21 Lestine Box, S/PT

## 2021-10-09 NOTE — Progress Notes (Signed)
Patient discharged today. Will plan for GI follow up for gastroparesis, esophageal dysmotility, anemia. Consider repeat upper endoscopy and colonoscopy as outpatient. She has pending outpatient MR Abd MRCP to evaluate pancreatic lesion.  She has follow up with GI already scheduled for 12/09/21. Will move up and see patient in next 4-6 weeks.    Laureen Ochs. Bernarda Caffey [facility] [phone] 7/13/202312:36 PM

## 2021-10-09 NOTE — Progress Notes (Signed)
Cardiology Progress Note  Patient ID: Claudia [name] MRN: [id number] DOB: 03-Mar-1946 Date of Encounter: 10/09/2021  Primary Cardiologist: None  Subjective   Chief Complaint: None.   HPI: Remains in NSR no further A-fib on telemetry  ROS:  All other ROS reviewed and negative. Pertinent positives noted in the HPI.     Inpatient Medications  Scheduled Meds:  dronedarone  400 mg Oral BID WC   pantoprazole  40 mg Oral Daily   sodium chloride flush  3 mL Intravenous Q12H   Continuous Infusions:  sodium chloride     ferric gluconate (FERRLECIT) IVPB     lactated ringers     PRN Meds: sodium chloride, acetaminophen **OR** acetaminophen, fluticasone, guaiFENesin, hydrALAZINE, ipratropium-albuterol, metoprolol tartrate, ondansetron **OR** ondansetron (ZOFRAN) IV, oxyCODONE, senna-docusate, sodium chloride flush, traZODone   Vital Signs   Vitals:   10/08/21 1830 10/08/21 1916 10/09/21 0421 10/09/21 0500  BP: 101/81 (!) 111/55 (!) 121/48   Pulse: 82 78 81   Resp: (!) 25 17 16    Temp:  99.2 F (37.3 C) 98 F (36.7 C)   TempSrc:      SpO2: 96% 98% 98%   Weight:    51.8 kg  Height:        Intake/Output Summary (Last 24 hours) at 10/09/2021 0915 Last data filed at 10/08/2021 1453 Gross per 24 hour  Intake 158.58 ml  Output 200 ml  Net -41.42 ml       10/09/2021    5:00 AM 10/07/2021   12:03 PM 10/07/2021    4:45 AM  Last 3 Weights  Weight (lbs) 114 lb 3.2 oz 108 lb 11 oz 110 lb  Weight (kg) 51.8 kg 49.3 kg 49.896 kg      Telemetry  Normal sinus rhythm, which I personally reviewed.   ECG  No new EKG to review Physical Exam   Vitals:   10/08/21 1830 10/08/21 1916 10/09/21 0421 10/09/21 0500  BP: 101/81 (!) 111/55 (!) 121/48   Pulse: 82 78 81   Resp: (!) 25 17 16    Temp:  99.2 F (37.3 C) 98 F (36.7 C)   TempSrc:      SpO2: 96% 98% 98%   Weight:    51.8 kg  Height:        Intake/Output Summary (Last 24 hours) at 10/09/2021 0915 Last data filed at  10/08/2021 1453 Gross per 24 hour  Intake 158.58 ml  Output 200 ml  Net -41.42 ml        10/09/2021    5:00 AM 10/07/2021   12:03 PM 10/07/2021    4:45 AM  Last 3 Weights  Weight (lbs) 114 lb 3.2 oz 108 lb 11 oz 110 lb  Weight (kg) 51.8 kg 49.3 kg 49.896 kg    Body mass index is 20.89 kg/m.  GEN: Well nourished, well developed in no acute distress HEENT: Normal NECK: No JVD; No carotid bruits LYMPHATICS: No lymphadenopathy CARDIAC:RRR, no murmurs, rubs, gallops RESPIRATORY:  Clear to auscultation without rales, wheezing or rhonchi  ABDOMEN: Soft, non-tender, non-distended MUSCULOSKELETAL:  No edema; No deformity  SKIN: Warm and dry NEUROLOGIC:  Alert and oriented x 3 PSYCHIATRIC:  Normal affect   Labs  High Sensitivity Troponin:   Recent Labs  Lab 10/07/21 0546 10/07/21 0811  TROPONINIHS 5 4      Cardiac EnzymesNo results for input(s): "TROPONINI" in the last 168 hours. No results for input(s): "TROPIPOC" in the last 168 hours.  Chemistry Recent Labs  Lab  10/02/21 1051 10/07/21 0546 10/08/21 0410 10/09/21 0412  NA 139 138 137 135  K 4.2 3.6 3.6 3.8  CL 102 101 103 104  CO2 32 31 29 27   GLUCOSE 90 135* 91 123*  BUN 23 19 15 11   CREATININE 0.83 0.91 0.80 0.78  CALCIUM 10.4* 9.7 9.0 9.1  PROT 7.1 7.3  --   --   ALBUMIN 3.3* 3.2*  --   --   AST 79* 16  --   --   ALT 60* 27  --   --   ALKPHOS 105 101  --   --   BILITOT 0.3 0.8  --   --   GFRNONAA >60 >60 >60 >60  ANIONGAP 5 6 5  4*     Hematology Recent Labs  Lab 10/07/21 0546 10/08/21 0410 10/09/21 0412  WBC 16.3* 17.3* 16.0*  RBC 3.78* 2.90* 2.91*  HGB 11.1* 8.5* 8.5*  HCT 34.4* 26.8* 27.2*  MCV 91.0 92.4 93.5  MCH 29.4 29.3 29.2  MCHC 32.3 31.7 31.3  RDW 15.9* 16.2* 16.0*  PLT 292 228 230    BNPNo results for input(s): "BNP", "PROBNP" in the last 168 hours.  DDimer No results for input(s): "DDIMER" in the last 168 hours.   Radiology  ECHOCARDIOGRAM COMPLETE  Result Date: 10/08/2021     ECHOCARDIOGRAM REPORT   Patient Name:   Claudia [name] Date of Exam: 10/08/2021 Medical Rec #:  [id number]       Height:       62.0 in Accession #:    [id number]      Weight:       108.7 lb Date of Birth:  [birth year]      BSA:          1.475 m Patient Age:    76 years        BP:           88/36 mmHg Patient Gender: F               HR:           79 bpm. Exam Location:  [facility] Procedure: 2D Echo, Cardiac Doppler and Color Doppler Indications:    Chest Pain  History:        Patient has prior history of Echocardiogram examinations, most                 recent 04/10/2011. COPD, Arrythmias:Atrial Fibrillation,                 Signs/Symptoms:Chest Pain; Risk Factors:Hypertension. Lung CA.  Sonographer:    Wenda Low Referring Phys: [place]  1. Left ventricular ejection fraction, by estimation, is 60 to 65%. The left ventricle has normal function. The left ventricle has no regional wall motion abnormalities. Left ventricular diastolic parameters were normal.  2. Right ventricular systolic function is normal. The right ventricular size is normal. There is normal pulmonary artery systolic pressure. The estimated right ventricular systolic pressure is 65.0 mmHg.  3. Moderate pericardial effusion. The pericardial effusion is posterior to the left ventricle and anterior to the right ventricle. There is no evidence of cardiac tamponade.  4. The mitral valve is grossly normal. No evidence of mitral valve regurgitation. No evidence of mitral stenosis.  5. The aortic valve is tricuspid. Aortic valve regurgitation is not visualized. No aortic stenosis is present.  6. The inferior vena cava is normal in size with greater than 50% respiratory variability, suggesting right  atrial pressure of 3 mmHg. FINDINGS  Left Ventricle: Left ventricular ejection fraction, by estimation, is 60 to 65%. The left ventricle has normal function. The left ventricle has no regional wall motion abnormalities. The left  ventricular internal cavity size was normal in size. There is  no left ventricular hypertrophy. Left ventricular diastolic parameters were normal. Right Ventricle: The right ventricular size is normal. No increase in right ventricular wall thickness. Right ventricular systolic function is normal. There is normal pulmonary artery systolic pressure. The tricuspid regurgitant velocity is 2.70 m/s, and  with an assumed right atrial pressure of 3 mmHg, the estimated right ventricular systolic pressure is 76.7 mmHg. Left Atrium: Left atrial size was normal in size. Right Atrium: Right atrial size was normal in size. Pericardium: A moderately sized pericardial effusion is present. The pericardial effusion is posterior to the left ventricle and anterior to the right ventricle. There is no evidence of cardiac tamponade. Presence of epicardial fat layer. Mitral Valve: The mitral valve is grossly normal. No evidence of mitral valve regurgitation. No evidence of mitral valve stenosis. MV peak gradient, 3.0 mmHg. The mean mitral valve gradient is 1.0 mmHg. Tricuspid Valve: The tricuspid valve is grossly normal. Tricuspid valve regurgitation is trivial. No evidence of tricuspid stenosis. Aortic Valve: The aortic valve is tricuspid. Aortic valve regurgitation is not visualized. No aortic stenosis is present. Aortic valve mean gradient measures 2.7 mmHg. Aortic valve peak gradient measures 5.0 mmHg. Aortic valve area, by VTI measures 2.35 cm. Pulmonic Valve: The pulmonic valve was grossly normal. Pulmonic valve regurgitation is trivial. No evidence of pulmonic stenosis. Aorta: The aortic root is normal in size and structure. Venous: The inferior vena cava is normal in size with greater than 50% respiratory variability, suggesting right atrial pressure of 3 mmHg. IAS/Shunts: The atrial septum is grossly normal.  LEFT VENTRICLE PLAX 2D LVIDd:         4.80 cm   Diastology LVIDs:         2.90 cm   LV e' medial:    8.70 cm/s LV PW:          0.80 cm   LV E/e' medial:  7.7 LV IVS:        1.10 cm   LV e' lateral:   7.40 cm/s LVOT diam:     1.90 cm   LV E/e' lateral: 9.1 LV SV:         49 LV SV Index:   34 LVOT Area:     2.84 cm  RIGHT VENTRICLE RV Basal diam:  1.55 cm RV Mid diam:    1.80 cm RV S prime:     10.30 cm/s TAPSE (M-mode): 2.0 cm LEFT ATRIUM             Index        RIGHT ATRIUM           Index LA diam:        2.80 cm 1.90 cm/m   RA Area:     11.30 cm LA Vol (A2C):   45.8 ml 31.05 ml/m  RA Volume:   23.70 ml  16.07 ml/m LA Vol (A4C):   27.2 ml 18.44 ml/m LA Biplane Vol: 35.3 ml 23.93 ml/m  AORTIC VALVE                    PULMONIC VALVE AV Area (Vmax):    2.68 cm     PV Vmax:  0.83 m/s AV Area (Vmean):   2.44 cm     PV Peak grad:  2.8 mmHg AV Area (VTI):     2.35 cm AV Vmax:           111.67 cm/s AV Vmean:          74.867 cm/s AV VTI:            0.210 m AV Peak Grad:      5.0 mmHg AV Mean Grad:      2.7 mmHg LVOT Vmax:         105.50 cm/s LVOT Vmean:        64.400 cm/s LVOT VTI:          0.174 m LVOT/AV VTI ratio: 0.83  AORTA Ao Root diam: 3.20 cm MITRAL VALVE               TRICUSPID VALVE MV Area (PHT): 3.00 cm    TR Peak grad:   29.2 mmHg MV Area VTI:   1.75 cm    TR Vmax:        270.00 cm/s MV Peak grad:  3.0 mmHg MV Mean grad:  1.0 mmHg    SHUNTS MV Vmax:       0.86 m/s    Systemic VTI:  0.17 m MV Vmean:      46.3 cm/s   Systemic Diam: 1.90 cm MV Decel Time: 253 msec MV E velocity: 67.30 cm/s MV A velocity: 65.60 cm/s MV E/A ratio:  1.03 Eleonore Chiquito MD Electronically signed by Eleonore Chiquito MD Signature Date/Time: 10/08/2021/10:13:27 AM    Final     Cardiac Studies  Echo pending  Patient Profile  Claudia [name] is a 76 y.o. female with small cell lung cancer, COPD, GERD who was admitted on 10/07/2021 with chest pain and atrial fibrillation with RVR.  Assessment & Plan   #Chest pain, noncardiac -Describes pressure in her chest.  No coronary calcium on chest CT.  EKG nonischemic.  Troponins are  negative. -Chest CT mentions hypoenhancement of the left ventricle.  On my review this is a nongated study.  Very difficult to make that call.  -Her chest CT also shows progressive esophageal dilation.  She has a known history of esophageal dysmotility and gastroparesis.  I suspect this is the main cause of her chest symptoms. -Also mention on chest CT of radiation-induced consolidation in the lungs.  There is also mention of possible fibrosis.  This could also explain her chest symptomology.  Also possible recurrence of cancer. -To me her chest pain is noncardiac and likely multifactorial in setting of esophageal dysmotility as well as lung injury from prior radiation.  -Would strongly consider GI evaluation for treatment of her esophageal dysmotility. -2D echo showed normal LVF with moderate posterior pericardial effusion  -will check CRP and ESR -start Colchicine 0.42m BID  #Atrial fibrillation with RVR -She reports she is losing weight.  She has poor eating pattern.  Suspect this is why she has not been tolerant of beta-blockers or calcium channel blockers. -TSH normal.  - 2D echo with normal LVF and normal LA size -Continue Multaq 400 mg twice daily.  There is no history of congestive heart failure to preclude her from this medication. -CHADSVASC=3 (age, female).  Anticoagulation is indicated however given drop in hemoglobin would recommend against anticoagulation at this time.  She will need evaluation of her anemia. -CT scan with mention of possible recurrence of lung cancer.  Also concern for pancreatic  mass.  I will defer work-up of this to hospital medicine. -Given anemia and drop in hemoglobin I have stopped her Lovenox.  If hemoglobin stabilizes and we are not concerned about possible GI bleed would then recommend anticoagulation however in this acute setting with drop in hemoglobin I would advise against this.  I have stopped her Lovenox.  #Anemia -Hemoglobin dropped from 11.1 to 8.5  yesterday and still 8,5 today -Would hold anticoagulation.   -Further work-up per hospital medicine team.  #Esophageal Dysmotility #Gastroparesis -Suspect this explains a lot of her chest symptoms.   -Further evaluation by GI if clinically indicated.  #Leukocytosis -Further work-up of infectious source per hospital medicine.  #Pericardial effusion -Reported as moderate posteriorly but I personally reviewed the images lesion is circumferential and mild to moderate mainly anterior in location -she had radiation but it was over 10 years ago so likely not radiation induced pericarditis -with hx of cancer and concern for recurrent malignancy in the chest, need to consider malignant effusion -We will start colchicine 0.6 mg twice daily and repeat 2D echo in 2 weeks -Check ESR and CRP  I have spent a total of 35 minutes with patient reviewing 2D echo images , telemetry, EKGs, labs and examining patient as well as establishing an assessment and plan that was discussed with the patient.  > 50% of time was spent in direct patient care.     For questions or updates, please contact [facility] Please consult [email or web address] for contact info under   Signed, Fransico Him, MD [place]  10/09/2021 9:15 AM

## 2021-10-09 NOTE — Discharge Summary (Signed)
Physician Discharge Summary   Patient: Claudia [name] MRN: [id number] DOB: 04/08/1945  Admit date:     10/07/2021  Discharge date: 10/09/21  Discharge Physician: Seyed A Shahmehdi   PCP: Vyas, Dhruv B, MD   Recommendations at discharge:  Follow-up with the PCP in 1 week CBC BMP in 1 week results to PCP Follow-up with a cardiologist in 1-2 weeks Follow-up with gastroenterologist in 1 week (Monitor hemoglobin today 8.5 and any further drop will need to see GI ASAP) Due to trickling down hemoglobin, severe iron deficiency, iron supplements added, will need a GI work-up as an outpatient... Therefore no chronic anticoagulation was added from cardiology.  Follow-up with a pulmonologist within 1-2 weeks  Discharge Diagnoses: Principal Problem:   Atrial fibrillation with RVR (HCC) Active Problems:   Small cell lung cancer (HCC)   COPD (chronic obstructive pulmonary disease) (HCC)   Anemia   Hypertension   Chest pain   GERD (gastroesophageal reflux disease)   Pericardial effusion  Resolved Problems:   * No resolved hospital problems. *  Hospital Course:  Claudia [name] is a 75 y.o. female with medical history significant of small cell lung cancer, gastroesophageal reflux disease, COPD, hypertension and dyslipidemia; who presented to the hospital secondary to shortness of breath and chest discomfort.  Patient does have a history of esophageal dilation and follows outpatient gastroenterology.   Cardiology and GI team consulted.    Assessment and Plan: * Atrial fibrillation with RVR (HCC) - Patient started on Cardizem drip with subsequent discontinuation in the setting of hypotension.  -TSH ordered and demonstrating normal thyroid function  -Given prior history of bradycardia beta-blockers will be avoided  2D echo with normal LVF and normal LA size -Continue Multaq 400 mg twice daily.  There is no history of congestive heart failure to preclude her from this  medication. -CHADSVASC=3 (age, female).  Anticoagulation is indicated however given drop in hemoglobin, cardiology recommend against anticoagulation at this time.    -CT scan with mention of possible recurrence of lung cancer.  Also concern for pancreatic mass.  -Given anemia and drop in hemoglobin I have stopped her Lovenox too.  If hemoglobin stabilizes then ecommend anticoagulation however in this acute setting with drop in hemoglobin they advised against this.    -After discussing with cardiology service decision made to start patient on Multaq      Small cell lung cancer (HCC) - Continue outpatient follow-up with oncology service.  Pericardial effusion 2D echo showed normal LVF with moderate posterior pericardial effusion  -CRP and ESR -108  -start Colchicine 0.6mg BID  GERD (gastroesophageal reflux disease) - Continue PPI.  Chest pain -Atypical in presentation -multifactorial (pericardial effusion, esophageal dysmotility, lung fibrosis, also arrhythmia -Chest pain has resolved  -Negative troponin -2D echo will be done to assess for structure and wall motion abnormalities. - 2D echo showed normal LVF with moderate posterior pericardial effusion  - CRP and ESR elevated at 108, -start Colchicine 0.6mg BID  Hypertension - At home not taking any medication recently blood pressure -Transient hypotension while treating rate earlier. -BP has stabilized  Anemia iron deficiency anemia Hemoglobin 8.5, 8.5 Iron level 13, TIBC 149, saturation ratio 9 Ferritin 675  IV iron x1, then supplement iron initiated  GI as an outpatient--for further evaluation No anticoagulation was initiated on this admission  COPD (chronic obstructive pulmonary disease) (HCC) - Allergies/no acute exacerbation seen currently -Continue as needed bronchodilators.     Consultants: Cardiologist/gastroenterologist Procedures performed: No procedures as inpatient,   recommended outpatient GI  work-up Disposition: Home Diet recommendation:  Discharge Diet Orders (From admission, onward)     Start     Ordered   10/09/21 0000  Diet - low sodium heart healthy        10/09/21 1112           Cardiac diet DISCHARGE MEDICATION: Allergies as of 10/09/2021       Reactions   Azithromycin Shortness Of Breath, Rash   Took Tussionex at same time Jan 2013.   Tussionex Pennkinetic Er [hydrocod Poli-chlorphe Poli Er] Shortness Of Breath, Rash   Took Azithromycin at same time Jan 2013        Medication List     STOP taking these medications    BENEFIBER PO       TAKE these medications    Coenzyme Q10 100 MG Tabs Take 100 mg by mouth daily.   colchicine 0.6 MG tablet Take 1 tablet (0.6 mg total) by mouth 2 (two) times daily for 10 days.   dronedarone 400 MG tablet Commonly known as: MULTAQ Take 1 tablet (400 mg total) by mouth 2 (two) times daily with a meal.   ferrous sulfate 325 (65 FE) MG tablet Take 1 tablet (325 mg total) by mouth daily.   fluticasone 50 MCG/ACT nasal spray Commonly known as: FLONASE Place 1 spray into both nostrils daily as needed for allergies.   pantoprazole 40 MG tablet Commonly known as: PROTONIX Take 1 tablet (40 mg total) by mouth daily. Start taking on: October 10, 2021   PROBIOTIC DAILY PO Take 1 capsule by mouth daily.   senna-docusate 8.6-50 MG tablet Commonly known as: Senokot-S Take 1 tablet by mouth at bedtime as needed for moderate constipation.   tretinoin 0.1 % cream Commonly known as: RETIN-A Apply 1 application. topically at bedtime.   TUMS PO Take 500-1,000 mg by mouth 3 (three) times daily as needed (indigestion/heartburn.).   Turmeric Curcumin 500 MG Caps Take 500 mg by mouth daily.   Vitamin D-3 25 MCG (1000 UT) Caps Take 1,000 Units by mouth daily.        Follow-up Information     Lenze, Michele M, PA-C Follow up on 10/20/2021.   Specialty: Cardiology Why: Cardiology Hospital Follow-up on  10/20/2021 at 11:15 AM. Contact information: [address] [place] [address] [phone]                Discharge Exam: Filed Weights   10/07/21 0445 10/07/21 1203 10/09/21 0500  Weight: 49.9 kg 49.3 kg 51.8 kg      Physical Exam:   General:  AAO x 3,  cooperative, no distress;   HEENT:  Normocephalic, PERRL, otherwise with in Normal limits   Neuro:  CNII-XII intact. , normal motor and sensation, reflexes intact   Lungs:   Clear to auscultation BL, Respirations unlabored,  No wheezes / crackles  Cardio:    S1/S2, RRR, No murmure, No Rubs or Gallops   Abdomen:  Soft, non-tender, bowel sounds active all four quadrants, no guarding or peritoneal signs.  Muscular  skeletal:  Limited exam -global generalized weaknesses - in bed, able to move all 4 extremities,   2+ pulses,  symmetric, No pitting edema  Skin:  Dry, warm to touch, negative for any Rashes,  Wounds: Please see nursing documentation          Condition at discharge: fair  The results of significant diagnostics from this hospitalization (including imaging, microbiology, ancillary and laboratory) are   listed below for reference.   Imaging Studies: ECHOCARDIOGRAM COMPLETE  Result Date: 10/08/2021    ECHOCARDIOGRAM REPORT   Patient Name:   Biance O Hjort Date of Exam: 10/08/2021 Medical Rec #:  [id number]       Height:       62.0 in Accession #:    [id number]      Weight:       108.7 lb Date of Birth:  06/25/1945      BSA:          1.475 m Patient Age:    75 years        BP:           88/36 mmHg Patient Gender: F               HR:           79 bpm. Exam Location:  [facility] Procedure: 2D Echo, Cardiac Doppler and Color Doppler Indications:    Chest Pain  History:        Patient has prior history of Echocardiogram examinations, most                 recent 04/10/2011. COPD, Arrythmias:Atrial Fibrillation,                 Signs/Symptoms:Chest Pain; Risk Factors:Hypertension. Lung CA.  Sonographer:    Dorothy  Buchanan Referring Phys: [id number] Rokhaya R INGOLD IMPRESSIONS  1. Left ventricular ejection fraction, by estimation, is 60 to 65%. The left ventricle has normal function. The left ventricle has no regional wall motion abnormalities. Left ventricular diastolic parameters were normal.  2. Right ventricular systolic function is normal. The right ventricular size is normal. There is normal pulmonary artery systolic pressure. The estimated right ventricular systolic pressure is 32.2 mmHg.  3. Moderate pericardial effusion. The pericardial effusion is posterior to the left ventricle and anterior to the right ventricle. There is no evidence of cardiac tamponade.  4. The mitral valve is grossly normal. No evidence of mitral valve regurgitation. No evidence of mitral stenosis.  5. The aortic valve is tricuspid. Aortic valve regurgitation is not visualized. No aortic stenosis is present.  6. The inferior vena cava is normal in size with greater than 50% respiratory variability, suggesting right atrial pressure of 3 mmHg. FINDINGS  Left Ventricle: Left ventricular ejection fraction, by estimation, is 60 to 65%. The left ventricle has normal function. The left ventricle has no regional wall motion abnormalities. The left ventricular internal cavity size was normal in size. There is  no left ventricular hypertrophy. Left ventricular diastolic parameters were normal. Right Ventricle: The right ventricular size is normal. No increase in right ventricular wall thickness. Right ventricular systolic function is normal. There is normal pulmonary artery systolic pressure. The tricuspid regurgitant velocity is 2.70 m/s, and  with an assumed right atrial pressure of 3 mmHg, the estimated right ventricular systolic pressure is 32.2 mmHg. Left Atrium: Left atrial size was normal in size. Right Atrium: Right atrial size was normal in size. Pericardium: A moderately sized pericardial effusion is present. The pericardial effusion is posterior to  the left ventricle and anterior to the right ventricle. There is no evidence of cardiac tamponade. Presence of epicardial fat layer. Mitral Valve: The mitral valve is grossly normal. No evidence of mitral valve regurgitation. No evidence of mitral valve stenosis. MV peak gradient, 3.0 mmHg. The mean mitral valve gradient is 1.0 mmHg. Tricuspid Valve: The tricuspid valve is grossly normal. Tricuspid valve regurgitation   is trivial. No evidence of tricuspid stenosis. Aortic Valve: The aortic valve is tricuspid. Aortic valve regurgitation is not visualized. No aortic stenosis is present. Aortic valve mean gradient measures 2.7 mmHg. Aortic valve peak gradient measures 5.0 mmHg. Aortic valve area, by VTI measures 2.35 cm. Pulmonic Valve: The pulmonic valve was grossly normal. Pulmonic valve regurgitation is trivial. No evidence of pulmonic stenosis. Aorta: The aortic root is normal in size and structure. Venous: The inferior vena cava is normal in size with greater than 50% respiratory variability, suggesting right atrial pressure of 3 mmHg. IAS/Shunts: The atrial septum is grossly normal.  LEFT VENTRICLE PLAX 2D LVIDd:         4.80 cm   Diastology LVIDs:         2.90 cm   LV e' medial:    8.70 cm/s LV PW:         0.80 cm   LV E/e' medial:  7.7 LV IVS:        1.10 cm   LV e' lateral:   7.40 cm/s LVOT diam:     1.90 cm   LV E/e' lateral: 9.1 LV SV:         49 LV SV Index:   34 LVOT Area:     2.84 cm  RIGHT VENTRICLE RV Basal diam:  1.55 cm RV Mid diam:    1.80 cm RV S prime:     10.30 cm/s TAPSE (M-mode): 2.0 cm LEFT ATRIUM             Index        RIGHT ATRIUM           Index LA diam:        2.80 cm 1.90 cm/m   RA Area:     11.30 cm LA Vol (A2C):   45.8 ml 31.05 ml/m  RA Volume:   23.70 ml  16.07 ml/m LA Vol (A4C):   27.2 ml 18.44 ml/m LA Biplane Vol: 35.3 ml 23.93 ml/m  AORTIC VALVE                    PULMONIC VALVE AV Area (Vmax):    2.68 cm     PV Vmax:       0.83 m/s AV Area (Vmean):   2.44 cm     PV Peak  grad:  2.8 mmHg AV Area (VTI):     2.35 cm AV Vmax:           111.67 cm/s AV Vmean:          74.867 cm/s AV VTI:            0.210 m AV Peak Grad:      5.0 mmHg AV Mean Grad:      2.7 mmHg LVOT Vmax:         105.50 cm/s LVOT Vmean:        64.400 cm/s LVOT VTI:          0.174 m LVOT/AV VTI ratio: 0.83  AORTA Ao Root diam: 3.20 cm MITRAL VALVE               TRICUSPID VALVE MV Area (PHT): 3.00 cm    TR Peak grad:   29.2 mmHg MV Area VTI:   1.75 cm    TR Vmax:        270.00 cm/s MV Peak grad:  3.0 mmHg MV Mean grad:  1.0 mmHg    SHUNTS MV Vmax:  0.86 m/s    Systemic VTI:  0.17 m MV Vmean:      46.3 cm/s   Systemic Diam: 1.90 cm MV Decel Time: 253 msec MV E velocity: 67.30 cm/s MV A velocity: 65.60 cm/s MV E/A ratio:  1.03 Eleonore Chiquito MD Electronically signed by Eleonore Chiquito MD Signature Date/Time: 10/08/2021/10:13:27 AM    Final    CT Angio Chest Pulmonary Embolism (PE) W or WO Contrast  Addendum Date: 10/07/2021   ADDENDUM REPORT: 10/07/2021 08:58 ADDENDUM: Not mentioned in the impressions is possible left apical ventricular hypoenhancement. Correlate with myocardial ischemic symptoms. Electronically Signed   By: Abigail Miyamoto M.D.   On: 10/07/2021 08:58   Result Date: 10/07/2021 CLINICAL DATA:  Anterior chest pain with inspiration. Status post wedge resection in 2015 for lung cancer. COPD. History of small-cell lung cancer. * Tracking Code: BO * EXAM: CT ANGIOGRAPHY CHEST WITH CONTRAST TECHNIQUE: Multidetector CT imaging of the chest was performed using the standard protocol during bolus administration of intravenous contrast. Multiplanar CT image reconstructions and MIPs were obtained to evaluate the vascular anatomy. RADIATION DOSE REDUCTION: This exam was performed according to the departmental dose-optimization program which includes automated exposure control, adjustment of the mA and/or kV according to patient size and/or use of iterative reconstruction technique. CONTRAST:  80m OMNIPAQUE IOHEXOL  350 MG/ML SOLN COMPARISON:  Chest radiograph 10/07/2021.  Chest CT of 10/02/2021. FINDINGS: Cardiovascular: The quality of this exam for evaluation of pulmonary embolism is good. No evidence of pulmonary embolism. Bovine arch. Advanced aortic and branch vessel atherosclerosis. Narrowing of the origin of the left carotid artery. Mild cardiomegaly with minimal anterior pericardial fluid. Question of left apical ventricular hypoenhancement on 72/9. Mediastinum/Nodes: No mediastinal or hilar adenopathy. Progressive esophageal dilatation with debris within. Lungs/Pleura: Circumferential left-sided pleural thickening and small volume left-sided pleural fluid are similar. Trace right pleural fluid is new. Moderate centrilobular emphysema. Again identified are clustered nodules within the anterior right upper lobe, likely post infectious/inflammatory. Right lower lobe 4 mm pulmonary nodule on 94/11 is unchanged. Left upper lung and perihilar consolidation, architectural distortion, traction bronchiectasis again identified. When comparing to the 04/02/2021 exam, there is increased anterior left lung soft tissue density with small volume extra alveolar gas including on 46/11 and 31/9. Upper Abdomen: Normal imaged portions of the liver, spleen, stomach, pancreas, gallbladder, adrenal glands. Upper pole left renal 1.8 cm low-density lesion is likely a cyst but is incompletely imaged. An upper pole right renal 8 mm complex lesion had decreased in size on the prior exam, favoring a hemorrhagic cyst. Musculoskeletal: Mild osteopenia. Review of the MIP images confirms the above findings. IMPRESSION: 1.  No evidence of pulmonary embolism. 2. Presumed radiation induced consolidation within the perihilar and upper left lung. When compared to the January exam, increased soft tissue density anteriorly with minimal new extraalveolar gas. Presuming the patient has not had recent radiation therapy to suggest developing fibrosis, consider  PET to exclude recurrent disease. 3. Similar small left and new trace right pleural effusions. 4. Progressive esophageal dilatation with debris within. Dysmotility and/or gastroesophageal reflux. Correlate with endoscopy of 07/29/2021. 5. Aortic atherosclerosis (ICD10-I70.0), coronary artery atherosclerosis and emphysema (ICD10-J43.9). Electronically Signed: By: KAbigail MiyamotoM.D. On: 10/07/2021 08:41   DG Chest Port 1 View  Result Date: 10/07/2021 CLINICAL DATA:  Chest pain and shortness of breath. History of lung cancer. EXAM: PORTABLE CHEST 1 VIEW COMPARISON:  Chest CT with contrast 10/02/2021, PA and lateral chest 09/11/2021 FINDINGS: Grossly stable appearance of left-sided  volume loss, posttreatment related upper lobe fibrosis with perihilar spiculation, and tenting along the left hemidiaphragm. Small left pleural effusion also appears similar. There previously was airspace disease along the superior aspect of the perihilar fibrosis which is not seen today. No infiltrate like abnormality is seen. The right lung is clear with COPD and postsurgical change in the upper lobe. The cardiac size is normal. There is aortic calcification with stable mediastinal configuration. There is osteopenia with degenerative changes of the spine. IMPRESSION: Left-sided postsurgical change, volume loss and treatment related fibrosis limit evaluation of the upper lung field. Elsewhere no acute process is suspected. COPD. Electronically Signed   By: Keith  Chesser M.D.   On: 10/07/2021 06:40   CT Chest W Contrast  Result Date: 10/02/2021 CLINICAL DATA:  Non-small-cell lung cancer. Restaging. * Tracking Code: BO * EXAM: CT CHEST WITH CONTRAST TECHNIQUE: Multidetector CT imaging of the chest was performed during intravenous contrast administration. RADIATION DOSE REDUCTION: This exam was performed according to the departmental dose-optimization program which includes automated exposure control, adjustment of the mA and/or kV  according to patient size and/or use of iterative reconstruction technique. CONTRAST:  75mL OMNIPAQUE IOHEXOL 300 MG/ML  SOLN COMPARISON:  04/02/2021 FINDINGS: Cardiovascular: The heart size is normal. No substantial pericardial effusion. Coronary artery calcification is evident. Mild atherosclerotic calcification is noted in the wall of the thoracic aorta. Mediastinum/Nodes: No mediastinal lymphadenopathy. Esophagus is patulous with food/fluid debris in the lumen, similar to prior and compatible with dysmotility or reflux. There is no axillary lymphadenopathy. Lungs/Pleura: Centrilobular and paraseptal emphysema evident. Volume loss left hemithorax is stable in the interval. Left parahilar architectural distortion and scarring is stable consistent with treatment related fibrosis. Stable 4 mm right lower lobe pulmonary nodule on 95/5. Cluster of tiny nodules in the anterior right upper lobe (images 52-56 of series 5) are stable, likely infectious/inflammatory. No focal airspace consolidation. Small left pleural effusion is progressive in the interval. Upper Abdomen: Stable cyst upper pole left kidney. 10 mm exophytic lesion upper pole right kidney (156/2) has attenuation too high to be a simple cyst but is decreased in size since an exam of 05/17/2017 suggesting involuting benign cyst. Musculoskeletal: No worrisome lytic or sclerotic osseous abnormality. IMPRESSION: 1. Interval increase in size of the small left pleural effusion. Otherwise stable exam. Post treatment changes in the parahilar left lung are stable. 2. 5 mm right lower lobe pulmonary nodule is stable and clustered nodularity in the right upper lobe is unchanged, compatible sequelae of infectious/inflammatory etiology. 3. Similar patulous esophagus with fluid and food debris in the lumen. Imaging features may reflect dysmotility or reflux. 4.  Emphysema (ICD10-J43.9) and Aortic Atherosclerosis (ICD10-170.0) Electronically Signed   By: Eric  Mansell M.D.    On: 10/02/2021 13:52   CT Abdomen Pelvis W Contrast  Result Date: 09/15/2021 CLINICAL DATA:  Nausea/vomiting recurrent vomiting rule out gastroparesis. Hiccups and vomiting while eating x 2 months with 15lb weight loss. History of reoccurring small cell lung ca 2015 EXAM: CT ABDOMEN AND PELVIS WITH CONTRAST TECHNIQUE: Multidetector CT imaging of the abdomen and pelvis was performed using the standard protocol following bolus administration of intravenous contrast. RADIATION DOSE REDUCTION: This exam was performed according to the departmental dose-optimization program which includes automated exposure control, adjustment of the mA and/or kV according to patient size and/or use of iterative reconstruction technique. CONTRAST:  80mL OMNIPAQUE IOHEXOL 300 MG/ML  SOLN COMPARISON:  CT abdomen pelvis 11/03/2010, CT chest 04/02/2021, CT abdomen pelvis 11/03/2010 FINDINGS: Lower chest:   Interval increase of a small volume left pleural effusion. Partially visualized left pericardial loculation pleural fluid. Stable 5 mm right lower lobe pulmonary nodule. Hepatobiliary: No focal liver abnormality. No gallstones, gallbladder wall thickening, or pericholecystic fluid. No biliary dilatation. Pancreas: Interval development of 0.3 cm proximal pancreatic hypodensity (2:31). Normal pancreatic contour. No surrounding inflammatory changes. No main pancreatic ductal dilatation. Spleen: Normal in size without focal abnormality. Adrenals/Urinary Tract: No adrenal nodule bilaterally. Bilateral kidneys enhance symmetrically. There is a 2.6 cm hypodense lesion within left kidney with a density of 26 Hounsfield units. Several other lesions demonstrate fluid density likely represent simple renal cysts. Simple renal cysts, in the absence of clinically indicated signs/symptoms, require no independent follow-up. Subcentimeter hypodensities are too small to characterize No hydronephrosis. No hydroureter. The urinary bladder is unremarkable.  On delayed imaging, there is no urothelial wall thickening and there are no filling defects in the opacified portions of the bilateral collecting systems or ureters. Stomach/Bowel: Stomach is within normal limits. No evidence of bowel wall thickening or dilatation. Appendix appears normal. Vascular/Lymphatic: No abdominal aorta or iliac aneurysm. Severe atherosclerotic plaque of the aorta and its branches. No abdominal, pelvic, or inguinal lymphadenopathy. Reproductive: Query thickened hypodense endometrium (7:63). The uterus and bilateral adnexal regions are otherwise unremarkable. Other: No intraperitoneal free fluid. No intraperitoneal free gas. No organized fluid collection. Musculoskeletal: No abdominal wall hernia or abnormality. No suspicious lytic or blastic osseous lesions. No acute displaced fracture. Multilevel degenerative changes of the spine with intervertebral disc space vacuum phenomenon. Mild retrolisthesis of L1 on L2 and L2 on L3. Grade 1 anterolisthesis of L5 on S1. IMPRESSION: 1. Interval increase of a small volume left pleural effusion. 2. Interval development of 0.3 cm proximal pancreatic hypodensity. Recommend MRI pancreatic protocol further evaluation. 3. Indeterminate 2.6 cm left renal lesion. This can further be evaluated on the MRI. 4. Query thickened endometrium versus uterine fibroid. Recommend correlation with pelvic ultrasound. 5.  Aortic Atherosclerosis (ICD10-I70.0). Electronically Signed   By: Iven Finn M.D.   On: 09/15/2021 20:37    Microbiology: Results for orders placed or performed during the hospital encounter of 10/07/21  MRSA Next Gen by PCR, Nasal     Status: None   Collection Time: 10/07/21 11:57 AM   Specimen: Nasal Mucosa; Nasal Swab  Result Value Ref Range Status   MRSA by PCR Next Gen NOT DETECTED NOT DETECTED Final    Comment: (NOTE) The GeneXpert MRSA Assay (FDA approved for NASAL specimens only), is one component of a comprehensive MRSA colonization  surveillance program. It is not intended to diagnose MRSA infection nor to guide or monitor treatment for MRSA infections. Test performance is not FDA approved in patients less than 25 years old. Performed at [facility], [address], [place], [place] [address]     Labs: CBC: Recent Labs  Lab 10/07/21 0546 10/08/21 0410 10/09/21 0412  WBC 16.3* 17.3* 16.0*  HGB 11.1* 8.5* 8.5*  HCT 34.4* 26.8* 27.2*  MCV 91.0 92.4 93.5  PLT 292 228 496   Basic Metabolic Panel: Recent Labs  Lab 10/07/21 0546 10/07/21 0811 10/08/21 0410 10/09/21 0412  NA 138  --  137 135  K 3.6  --  3.6 3.8  CL 101  --  103 104  CO2 31  --  29 27  GLUCOSE 135*  --  91 123*  BUN 19  --  15 11  CREATININE 0.91  --  0.80 0.78  CALCIUM 9.7  --  9.0 9.1  MG  --  1.7  --  1.8   Liver Function Tests: Recent Labs  Lab 10/07/21 0546  AST 16  ALT 27  ALKPHOS 101  BILITOT 0.8  PROT 7.3  ALBUMIN 3.2*   CBG: No results for input(s): "GLUCAP" in the last 168 hours.  Discharge time spent: greater than 30 minutes.  Signed: Deatra James, MD [facility] 10/09/2021

## 2021-10-09 NOTE — Assessment & Plan Note (Signed)
iron deficiency anemia Hemoglobin 8.5, 8.5 Iron level 13, TIBC 149, saturation ratio 9 Ferritin 675  IV iron x1, then supplement iron initiated  GI as an outpatient--for further evaluation No anticoagulation was initiated on this admission

## 2021-10-09 NOTE — Hospital Course (Signed)
Claudia [name] is a 76 y.o. female with medical history significant of small cell lung cancer, gastroesophageal reflux disease, COPD, hypertension and dyslipidemia; who presented to the hospital secondary to shortness of breath and chest discomfort.  Patient does have a history of esophageal dilation and follows outpatient gastroenterology.  Cardiology and GI team consulted.

## 2021-10-09 NOTE — Telephone Encounter (Signed)
Patient going home from hospital today and will need follow up. She may benefit from being seen in 4 weeks or so, currently scheduled in 11/2021.

## 2021-10-09 NOTE — Assessment & Plan Note (Addendum)
2D echo showed normal LVF with moderate posterior pericardial effusion  -CRP and ESR -108  -start Colchicine 0.76m BID

## 2021-10-10 LAB — HIGH SENSITIVITY CRP: CRP, High Sensitivity: 277.03 mg/L — ABNORMAL HIGH (ref 0.00–3.00)

## 2021-10-13 DIAGNOSIS — D509 Iron deficiency anemia, unspecified: Secondary | ICD-10-CM | POA: Diagnosis not present

## 2021-10-13 DIAGNOSIS — E440 Moderate protein-calorie malnutrition: Secondary | ICD-10-CM | POA: Diagnosis not present

## 2021-10-13 DIAGNOSIS — I4891 Unspecified atrial fibrillation: Secondary | ICD-10-CM | POA: Diagnosis not present

## 2021-10-13 DIAGNOSIS — Z299 Encounter for prophylactic measures, unspecified: Secondary | ICD-10-CM | POA: Diagnosis not present

## 2021-10-13 DIAGNOSIS — Z789 Other specified health status: Secondary | ICD-10-CM | POA: Diagnosis not present

## 2021-10-13 DIAGNOSIS — C3490 Malignant neoplasm of unspecified part of unspecified bronchus or lung: Secondary | ICD-10-CM | POA: Diagnosis not present

## 2021-10-16 DIAGNOSIS — Z01818 Encounter for other preprocedural examination: Secondary | ICD-10-CM | POA: Diagnosis not present

## 2021-10-16 DIAGNOSIS — K869 Disease of pancreas, unspecified: Secondary | ICD-10-CM | POA: Diagnosis not present

## 2021-10-20 ENCOUNTER — Encounter: Payer: Self-pay | Admitting: Physician Assistant

## 2021-10-20 ENCOUNTER — Other Ambulatory Visit: Payer: Self-pay | Admitting: *Deleted

## 2021-10-20 ENCOUNTER — Ambulatory Visit: Payer: Medicare PPO | Admitting: Physician Assistant

## 2021-10-20 VITALS — BP 116/63 | HR 96 | Ht 61.0 in | Wt 109.0 lb

## 2021-10-20 DIAGNOSIS — C3411 Malignant neoplasm of upper lobe, right bronchus or lung: Secondary | ICD-10-CM | POA: Diagnosis not present

## 2021-10-20 DIAGNOSIS — K209 Esophagitis, unspecified without bleeding: Secondary | ICD-10-CM | POA: Diagnosis not present

## 2021-10-20 DIAGNOSIS — I480 Paroxysmal atrial fibrillation: Secondary | ICD-10-CM | POA: Diagnosis not present

## 2021-10-20 DIAGNOSIS — R079 Chest pain, unspecified: Secondary | ICD-10-CM | POA: Diagnosis not present

## 2021-10-20 DIAGNOSIS — R6881 Early satiety: Secondary | ICD-10-CM | POA: Diagnosis not present

## 2021-10-20 DIAGNOSIS — D649 Anemia, unspecified: Secondary | ICD-10-CM | POA: Diagnosis not present

## 2021-10-20 DIAGNOSIS — Z902 Acquired absence of lung [part of]: Secondary | ICD-10-CM | POA: Diagnosis not present

## 2021-10-20 DIAGNOSIS — C3490 Malignant neoplasm of unspecified part of unspecified bronchus or lung: Secondary | ICD-10-CM

## 2021-10-20 DIAGNOSIS — N2889 Other specified disorders of kidney and ureter: Secondary | ICD-10-CM | POA: Diagnosis not present

## 2021-10-20 DIAGNOSIS — R634 Abnormal weight loss: Secondary | ICD-10-CM | POA: Diagnosis not present

## 2021-10-20 DIAGNOSIS — I3139 Other pericardial effusion (noninflammatory): Secondary | ICD-10-CM | POA: Diagnosis not present

## 2021-10-20 LAB — OCCULT BLOOD X 1 CARD TO LAB, STOOL
Fecal Occult Bld: NEGATIVE
Fecal Occult Bld: NEGATIVE
Fecal Occult Bld: NEGATIVE

## 2021-10-20 NOTE — Patient Instructions (Addendum)
Medication Instructions:   Restart Multaq ( Take with food)   *If you need a refill on your cardiac medications before your next appointment, please call your pharmacy*   Lab Work: NONE   If you have labs (blood work) drawn today and your tests are completely normal, you will receive your results only by: [place] (if you have MyChart) OR A paper copy in the mail If you have any lab test that is abnormal or we need to change your treatment, we will call you to review the results.   Testing/Procedures: Your physician has requested that you have an echocardiogram. Echocardiography is a painless test that uses sound waves to create images of your heart. It provides your doctor with information about the size and shape of your heart and how well your heart's chambers and valves are working. This procedure takes approximately one hour. There are no restrictions for this procedure.     Follow-Up: At [facility], you and your health needs are our priority.  As part of our continuing mission to provide you with exceptional heart care, we have created designated Provider Care Teams.  These Care Teams include your primary Cardiologist (physician) and Advanced Practice Providers (APPs -  Physician Assistants and Nurse Practitioners) who all work together to provide you with the care you need, when you need it.  We recommend signing up for the patient portal called "MyChart".  Sign up information is provided on this After Visit Summary.  MyChart is used to connect with patients for Virtual Visits (Telemedicine).  Patients are able to view lab/test results, encounter notes, upcoming appointments, etc.  Non-urgent messages can be sent to your provider as well.   To learn more about what you can do with MyChart, go to [email or web address].    Your next appointment:    December   The format for your next appointment:   In Person  Provider:   Dorris Carnes, MD    Other  Instructions Thank you for choosing [facility]!    Important Information About Sugar

## 2021-10-22 DIAGNOSIS — D72829 Elevated white blood cell count, unspecified: Secondary | ICD-10-CM | POA: Diagnosis not present

## 2021-10-25 ENCOUNTER — Telehealth (INDEPENDENT_AMBULATORY_CARE_PROVIDER_SITE_OTHER): Payer: Self-pay | Admitting: Gastroenterology

## 2021-10-25 NOTE — Telephone Encounter (Signed)
Hi Crystal,  Can you please call the patient and tell the patient I reviewed the images of her most recent MRCP with Dr. Thornton Papas (radiologist at [facility]).  The cystic lesion was not identified in the pancreas in this image.  We will recommend a repeat MRCP in 2 years to make sure that this lesion is completely gone.  Thanks,  [name], MD Gastroenterology and Hepatology [facility]

## 2021-10-27 NOTE — Telephone Encounter (Signed)
Claudia [name] would you please place in the recalls for repeat MRCP in 2 years. Thanks  Patient made aware of all.

## 2021-10-27 NOTE — Telephone Encounter (Signed)
2 yr MRCP noted in recall

## 2021-10-28 ENCOUNTER — Other Ambulatory Visit: Payer: Self-pay | Admitting: Physician Assistant

## 2021-10-28 ENCOUNTER — Ambulatory Visit (HOSPITAL_COMMUNITY)
Admission: RE | Admit: 2021-10-28 | Discharge: 2021-10-28 | Disposition: A | Discharge status: Home / Self Care | Payer: Medicare PPO | Source: Ambulatory Visit | Attending: Physician Assistant | Admitting: Physician Assistant

## 2021-10-28 DIAGNOSIS — I3139 Other pericardial effusion (noninflammatory): Secondary | ICD-10-CM

## 2021-10-28 DIAGNOSIS — I10 Essential (primary) hypertension: Secondary | ICD-10-CM | POA: Diagnosis not present

## 2021-10-28 DIAGNOSIS — Z85118 Personal history of other malignant neoplasm of bronchus and lung: Secondary | ICD-10-CM

## 2021-10-28 LAB — ECHOCARDIOGRAM LIMITED
Area-P 1/2: 4.6 cm2
S' Lateral: 2.7 cm

## 2021-10-28 NOTE — Progress Notes (Signed)
*  PRELIMINARY RESULTS* Echocardiogram Limited 2-D Echocardiogram  has been performed.  Claudia [name] 10/28/2021, 4:03 PM

## 2021-10-31 ENCOUNTER — Telehealth: Payer: Self-pay

## 2021-10-31 DIAGNOSIS — I3139 Other pericardial effusion (noninflammatory): Secondary | ICD-10-CM

## 2021-10-31 NOTE — Telephone Encounter (Signed)
F/u echo apt made, f/u apt made

## 2021-10-31 NOTE — Telephone Encounter (Signed)
-----   Message from [name], LPN sent at 05/04/6718  3:58 PM EDT ----- Claudia [name] pt  ----- Message ----- From: Fay Records, MD Sent: 10/30/2021   3:47 PM EDT To: [name], PA-C; Cv Div Ch St Triage  Echo shows effusion is moderate     I would recomm follow up in 4 wks  (Limited for size and for diastolic funcition) REviewed with [name]   Pt having loose BMs    Stopping colchicine  Follow    Not having pain   Call if symtpoms recur

## 2021-11-13 ENCOUNTER — Ambulatory Visit (INDEPENDENT_AMBULATORY_CARE_PROVIDER_SITE_OTHER): Payer: Medicare PPO | Admitting: Gastroenterology

## 2021-11-13 ENCOUNTER — Encounter (INDEPENDENT_AMBULATORY_CARE_PROVIDER_SITE_OTHER): Payer: Self-pay | Admitting: Gastroenterology

## 2021-11-13 VITALS — BP 135/75 | HR 83 | Temp 98.2°F | Ht 61.0 in | Wt 108.8 lb

## 2021-11-13 DIAGNOSIS — K219 Gastro-esophageal reflux disease without esophagitis: Secondary | ICD-10-CM | POA: Diagnosis not present

## 2021-11-13 DIAGNOSIS — D509 Iron deficiency anemia, unspecified: Secondary | ICD-10-CM | POA: Insufficient documentation

## 2021-11-13 DIAGNOSIS — K869 Disease of pancreas, unspecified: Secondary | ICD-10-CM | POA: Diagnosis not present

## 2021-11-13 DIAGNOSIS — K3184 Gastroparesis: Secondary | ICD-10-CM

## 2021-11-13 NOTE — Patient Instructions (Signed)
Restart omeprazole 40 mg qday Try to eat multiple small meals during the day Please call to the office if interested on starting Reglan 10 mg TID Repeat MRCP in 2 years

## 2021-11-13 NOTE — Progress Notes (Signed)
[name], M.D. Gastroenterology & Hepatology [facility] [address] [place], [place] [address]  Primary Care Physician: [name], MD [place] [address]  I will communicate my assessment and recommendations to the referring MD via EMR.  Problems: Severe gastroparesis Recurrent hiccups GERD Lymphocytic colitis Iron deficiency anemia  History of Present Illness: Claudia [name] is a 76 y.o. female with PMH recurrent small cell lung cancer status post chemotherapy with carboplatin and etoposide, status post radiation therapy and prophylactic cranial radiation, recurrent malignancy was treated with wedge resection, possible lymphocytic colitis, COPD, GERD, who presents for follow up of iron deficiency anemia, severe gastroparesis and recurrent hiccups.  The patient was last seen on 09/25/2021. At that time, the patient was ordered to undergo MRCP for evaluation of pancreatic cysts.  She was advised to liberalize her diet and the protein shakes during the day.  Patient was seen recently when hospitalized with acute chest pain at [facility] on mid July 2023.  Gastroenterology was consulted for normocytic anemia.  Hemoglobin decreased down to 8.5 (previous hemoglobin was 12.76 months prior).  Folic acid, vitamin H06 were normal.  Iron was decreased at 13, saturation was also low at 9%.  Due to the concern of gastrointestinal losses, she also performed fecal occult blood testing as outpatient which was negative x3.  Patient states that she could not tolerate the omeprazole, as she developed a macular rash in her skin. She only tried the omeprazole for a couple of days but has not restarted it.  She has presented some intermittent episodes of vomiting during the evening, although she does not think it happens often, possibly once a week. She reports having significant hiccups  after eating half of her meal.  The  patient denies having any fever, chills, hematochezia, melena, hematemesis, abdominal distention, abdominal pain, diarrhea, jaundice, pruritus. Has lost 3 lb since her last appointment.  She underwent recent MRCP at outside facility which showed that the cystic lesion found on CT scan was no longer present.  She was recommended to have her MRCP in 2 years per protocol.  Last Colonoscopy:Last Colonoscopy:2013 - sigmoid diverticulosis, patchy loss of vascular marking, biopsies performed but no results available Last Endoscopy:5/2/23Fluid in the esophagus. Fluid aspiration performed. - A large amount of food (residue) in the stomach. - Normal examined duodenum GES:08/13/21: severe delay in gastric emptying, recommend eat small meals throughout the day  Past Medical History: Past Medical History:  Diagnosis Date   Anemia    Arthritis    Bradycardia    mild,may be due to beta blocker therapy   COPD (chronic obstructive pulmonary disease) (HCC)    GERD (gastroesophageal reflux disease)    otc   Hypertension 06/01/2019   pt denies   Local recurrence of lung cancer ([place]) dx'd 07/2013   rt thoracotomy chemo comp 12/2013   Lung cancer ([place]) 03/30/2008   Dr. Julien Nordmann, finished chemo, sp radiation, left upper    Lymphocytic colitis    Pneumonia     Past Surgical History: Past Surgical History:  Procedure Laterality Date   BRONCHOSCOPY  2011   DILATION AND CURETTAGE OF UTERUS     ESOPHAGOGASTRODUODENOSCOPY (EGD) WITH PROPOFOL N/A 07/29/2021   Procedure: ESOPHAGOGASTRODUODENOSCOPY (EGD) WITH PROPOFOL;  Surgeon: Harvel Quale, MD;  [facility];  Service: Gastroenterology;  Laterality: N/A;  1235 ASA 2   NECK SURGERY  1980's   THORACOTOMY Right 09/21/2013   Procedure:  THORACOTOMY MAJOR;  Surgeon: Gaye Pollack, MD;  Location[facility] OR;  Service: Thoracic;  Laterality: Right;   UTERINE FIBROID SURGERY  2012   WEDGE RESECTION Right 09/21/2013   Procedure: RIGHT UPPER LOBE WEDGE  RESECTION;  Surgeon: Gaye Pollack, MD;  Location: [facility] OR;  Service: Thoracic;  Laterality: Right;    Family History: Family History  Problem Relation Age of Onset   Brain cancer Mother        brain cancer   Emphysema Father    Diabetes Son    Heart disease Paternal Grandfather    Breast cancer Maternal Aunt     Social History: Social History   Tobacco Use  Smoking Status Former   Packs/day: 1.00   Years: 35.00   Total pack years: 35.00   Types: Cigarettes   Quit date: 03/31/2007   Years since quitting: 14.6   Passive exposure: Past  Smokeless Tobacco Never   Social History   Substance and Sexual Activity  Alcohol Use Yes   Comment: occassional about 3 drinks per year   Social History   Substance and Sexual Activity  Drug Use No    Allergies: Allergies  Allergen Reactions   Azithromycin Shortness Of Breath and Rash    Took Tussionex at same time Jan 2013.   Tussionex Pennkinetic Er [Hydrocod Poli-Chlorphe Poli Er] Shortness Of Breath and Rash    Took Azithromycin at same time Jan 2013   Omeprazole Rash    Medications: Current Outpatient Medications  Medication Sig Dispense Refill   Coenzyme Q10 100 MG TABS Take 100 mg by mouth daily.     dronedarone (MULTAQ) 400 MG tablet Take 1 tablet (400 mg total) by mouth 2 (two) times daily with a meal. 60 tablet 3   Probiotic Product (PROBIOTIC DAILY PO) Take 1 capsule by mouth daily.     Turmeric Curcumin 500 MG CAPS Take 500 mg by mouth daily.     Calcium Carbonate Antacid (TUMS PO) Take 500-1,000 mg by mouth 3 (three) times daily as needed (indigestion/heartburn.). (Patient not taking: Reported on 11/13/2021)     Cholecalciferol (VITAMIN D-3) 25 MCG (1000 UT) CAPS Take 1,000 Units by mouth daily. (Patient not taking: Reported on 11/13/2021)     colchicine 0.6 MG tablet Take 1 tablet (0.6 mg total) by mouth 2 (two) times daily for 10 days. (Patient not taking: Reported on 11/13/2021) 20 tablet 0   ferrous sulfate 325 (65  FE) MG tablet Take 1 tablet (325 mg total) by mouth daily. (Patient not taking: Reported on 11/13/2021) 30 tablet 3   fluticasone (FLONASE) 50 MCG/ACT nasal spray Place 1 spray into both nostrils daily as needed for allergies. (Patient not taking: Reported on 11/13/2021)     pantoprazole (PROTONIX) 40 MG tablet Take 1 tablet (40 mg total) by mouth daily. (Patient not taking: Reported on 11/13/2021) 30 tablet 0   tretinoin (RETIN-A) 0.1 % cream Apply 1 application. topically at bedtime. (Patient not taking: Reported on 11/13/2021)     No current facility-administered medications for this visit.    Review of Systems: GENERAL: negative for malaise, night sweats HEENT: No changes in hearing or vision, no nose bleeds or other nasal problems. NECK: Negative for lumps, goiter, pain and significant neck swelling RESPIRATORY: Negative for cough, wheezing CARDIOVASCULAR: Negative for chest pain, leg swelling, palpitations, orthopnea GI: SEE HPI MUSCULOSKELETAL: Negative for joint pain or swelling, back pain, and muscle pain. SKIN: Negative for lesions, rash PSYCH: Negative for sleep disturbance, mood  disorder and recent psychosocial stressors. HEMATOLOGY Negative for prolonged bleeding, bruising easily, and swollen nodes. ENDOCRINE: Negative for cold or heat intolerance, polyuria, polydipsia and goiter. NEURO: negative for tremor, gait imbalance, syncope and seizures. The remainder of the review of systems is noncontributory.   Physical Exam: BP 135/75 (BP Location: Left Arm, Patient Position: Sitting, Cuff Size: Normal)   Pulse 83   Temp 98.2 F (36.8 C) (Oral)   Ht 5\' 1"  (1.549 m)   Wt 108 lb 12.8 oz (49.4 kg)   BMI 20.56 kg/m  GENERAL: The patient is AO x3, in no acute distress. HEENT: Head is normocephalic and atraumatic. EOMI are intact. Mouth is well hydrated and without lesions. NECK: Supple. No masses LUNGS: Clear to auscultation. No presence of rhonchi/wheezing/rales. Adequate chest  expansion HEART: RRR, normal s1 and s2. ABDOMEN: Soft, nontender, no guarding, no peritoneal signs, and nondistended. BS +. No masses. EXTREMITIES: Without any cyanosis, clubbing, rash, lesions or edema. NEUROLOGIC: AOx3, no focal motor deficit. SKIN: no jaundice, no rashes  Imaging/Labs: as above  I personally reviewed and interpreted the available labs, imaging and endoscopic files.  Impression and Plan: TIKESHA MORT is a 76 y.o. female with PMH recurrent small cell lung cancer status post chemotherapy with carboplatin and etoposide, status post radiation therapy and prophylactic cranial radiation, recurrent malignancy was treated with wedge resection, possible lymphocytic colitis, COPD, GERD, who presents for follow up of iron deficiency anemia, severe gastroparesis and recurrent hiccups.  The patient has presented recurrent hiccups and episodes of regurgitation, which are likely related to her gastroparesis.  She is not on any medical treatment at the moment.  We had a thorough discussion regarding the available agents for management of gastroparesis, which may potentially improve her oral intake and manage her weight loss.  She stated that she is not interested in taking higher doses of Reglan or domperidone, but she will like to continue with frequent small meals during the day.  She will let us know if she wants to start Reglan in the future.  In terms of her GERD, she may restart the omeprazole 40 mg every day and assess her symptom control once this medication is restarted, as she is not sure if the PPI led to the rash in the past.  Also, she was found to have iron deficiency anemia of unclear etiology.  Upper endoscopy was negative for any upper gastrointestinal lesions explain her anemia.  We discussed the possibility of pursuing a colonoscopy but the patient adamantly refused to undergo this, especially as she had negative stool testing x3.  I strongly advised her to pursue this but  she is not interested in this.  Finally, she had an incidental pancreatic cyst found on CT scan but MRCP was unremarkable.  She will need to have a repeat MRCP in 2 years.  - Restart omeprazole 40 mg qday -Try to eat multiple small meals and snacks during the day -Patient to call to the office if interested on starting Reglan 10 mg TID -Repeat MRCP in 2 years  All questions were answered.      Harvel Quale, MD Gastroenterology and Hepatology [facility]

## 2021-12-02 ENCOUNTER — Ambulatory Visit (HOSPITAL_COMMUNITY)
Admission: RE | Admit: 2021-12-02 | Discharge: 2021-12-02 | Disposition: A | Discharge status: Home / Self Care | Payer: Medicare PPO | Source: Ambulatory Visit | Attending: Internal Medicine | Admitting: Internal Medicine

## 2021-12-02 DIAGNOSIS — I3139 Other pericardial effusion (noninflammatory): Secondary | ICD-10-CM | POA: Diagnosis not present

## 2021-12-02 LAB — ECHOCARDIOGRAM LIMITED: S' Lateral: 3.2 cm

## 2021-12-02 NOTE — Progress Notes (Signed)
*  PRELIMINARY RESULTS* Echocardiogram Limited 2D Echocardiogram has been performed.  Claudia [name] 12/02/2021, 12:14 PM

## 2021-12-09 ENCOUNTER — Ambulatory Visit (INDEPENDENT_AMBULATORY_CARE_PROVIDER_SITE_OTHER): Payer: Medicare PPO | Admitting: Gastroenterology

## 2022-02-15 ENCOUNTER — Encounter (INDEPENDENT_AMBULATORY_CARE_PROVIDER_SITE_OTHER): Payer: Self-pay | Admitting: Gastroenterology

## 2022-02-27 ENCOUNTER — Ambulatory Visit: Payer: Medicare PPO | Admitting: Internal Medicine

## 2022-02-27 ENCOUNTER — Other Ambulatory Visit
Admission: RE | Admit: 2022-02-27 | Discharge: 2022-02-27 | Disposition: A | Discharge status: Home / Self Care | Payer: Medicare PPO | Source: Ambulatory Visit | Attending: Internal Medicine | Admitting: Internal Medicine

## 2022-02-27 ENCOUNTER — Encounter: Payer: Self-pay | Admitting: Internal Medicine

## 2022-02-27 VITALS — BP 158/80 | HR 81 | Ht 61.0 in | Wt 101.6 lb

## 2022-02-27 DIAGNOSIS — I2510 Atherosclerotic heart disease of native coronary artery without angina pectoris: Secondary | ICD-10-CM | POA: Diagnosis not present

## 2022-02-27 DIAGNOSIS — I480 Paroxysmal atrial fibrillation: Secondary | ICD-10-CM

## 2022-02-27 LAB — COMPREHENSIVE METABOLIC PANEL
ALT: 10 U/L (ref 0–44)
AST: 14 U/L — ABNORMAL LOW (ref 15–41)
Albumin: 3.5 g/dL (ref 3.5–5.0)
Alkaline Phosphatase: 70 U/L (ref 38–126)
Anion gap: 8 (ref 5–15)
BUN: 14 mg/dL (ref 8–23)
CO2: 28 mmol/L (ref 22–32)
Calcium: 9.7 mg/dL (ref 8.9–10.3)
Chloride: 102 mmol/L (ref 98–111)
Creatinine, Ser: 0.73 mg/dL (ref 0.44–1.00)
GFR, Estimated: >60 mL/min (ref >60)
Glucose, Bld: 90 mg/dL (ref 70–99)
Potassium: 3.7 mmol/L (ref 3.5–5.1)
Sodium: 138 mmol/L (ref 135–145)
Total Bilirubin: 0.5 mg/dL (ref 0.3–1.2)
Total Protein: 7.1 g/dL (ref 6.5–8.1)

## 2022-02-27 LAB — CBC
HCT: 31.9 % — ABNORMAL LOW (ref 36.0–46.0)
Hemoglobin: 10.3 g/dL — ABNORMAL LOW (ref 12.0–15.0)
MCH: 29.8 pg (ref 26.0–34.0)
MCHC: 32.3 g/dL (ref 30.0–36.0)
MCV: 92.2 fL (ref 80.0–100.0)
Platelets: 171 10*3/uL (ref 150–400)
RBC: 3.46 MIL/uL — ABNORMAL LOW (ref 3.87–5.11)
RDW: 14.6 % (ref 11.5–15.5)
WBC: 4.7 10*3/uL (ref 4.0–10.5)
nRBC: 0 % (ref 0.0–0.2)

## 2022-02-27 NOTE — Patient Instructions (Addendum)
Medication Instructions:  Your physician recommends that you continue on your current medications as directed. Please refer to the Current Medication list given to you today.   Labwork: CMET, CBC, NMR  Testing/Procedures: 30 day [employer] monitor  It will be mailed to your house    Follow-Up: April/May with Dr.Ross  Any Other Special Instructions Will Be Listed Below (If Applicable).  If you need a refill on your cardiac medications before your next appointment, please call your pharmacy.

## 2022-02-27 NOTE — Progress Notes (Unsigned)
Cardiology Office Note   Date:  02/27/2022   ID:  Claudia [name], DOB 12/10/1945, MRN [id number]  PCP:  [name], MD  Cardiologist:   Dorris Carnes, MD   Pt presents for follow up of atrial fibrillation     History of Present Illness: Claudia [name] is a 76 y.o. female with a history of small cell lung CA (2011, recurrence 2015), bradcyardia (may be related to meds) , COPD and GERD     The pt was hosp in 10/07/2021 for CP, afib, hypotension.   Rx IV dilt   COnverted to SR vs atrial arrhythma.     Diltiazem stopped    Pt placed on Multaq   Echo done   LVEF 60 to 65%  Moderate pericardial effusion   Rx colchcine     Pt denies palpitations   Never felt palpitations.    No chest pains   No dizzinesss  Breathing is OK   Food slow to go down       Current Meds  Medication Sig   Calcium Carbonate Antacid (TUMS PO) Take 500-1,000 mg by mouth 3 (three) times daily as needed (indigestion/heartburn.).   Cholecalciferol (VITAMIN D-3) 25 MCG (1000 UT) CAPS Take 1,000 Units by mouth daily.   Coenzyme Q10 100 MG TABS Take 100 mg by mouth daily.   Probiotic Product (PROBIOTIC DAILY PO) Take 1 capsule by mouth daily.   tretinoin (RETIN-A) 0.1 % cream Apply 1 application  topically at bedtime.   Turmeric Curcumin 500 MG CAPS Take 500 mg by mouth daily.     Allergies:   Azithromycin, Tussionex pennkinetic er [hydrocod poli-chlorphe poli er], and Omeprazole   Past Medical History:  Diagnosis Date   Anemia    Arthritis    Bradycardia    mild,may be due to beta blocker therapy   COPD (chronic obstructive pulmonary disease) (HCC)    GERD (gastroesophageal reflux disease)    otc   Hypertension 06/01/2019   pt denies   Local recurrence of lung cancer ([place]) dx'd 07/2013   rt thoracotomy chemo comp 12/2013   Lung cancer ([place]) 03/30/2008   Dr. Julien Nordmann, finished chemo, sp radiation, left upper    Lymphocytic colitis    Pneumonia     Past Surgical History:  Procedure Laterality Date    BRONCHOSCOPY  2011   DILATION AND CURETTAGE OF UTERUS     ESOPHAGOGASTRODUODENOSCOPY (EGD) WITH PROPOFOL N/A 07/29/2021   Procedure: ESOPHAGOGASTRODUODENOSCOPY (EGD) WITH PROPOFOL;  Surgeon: Harvel Quale, MD;  [facility];  Service: Gastroenterology;  Laterality: N/A;  1235 ASA 2   NECK SURGERY  1980's   THORACOTOMY Right 09/21/2013   Procedure: THORACOTOMY MAJOR;  Surgeon: Gaye Pollack, MD;  Location: [facility] OR;  Service: Thoracic;  Laterality: Right;   UTERINE FIBROID SURGERY  2012   WEDGE RESECTION Right 09/21/2013   Procedure: RIGHT UPPER LOBE WEDGE RESECTION;  Surgeon: Gaye Pollack, MD;  Location: [facility] OR;  Service: Thoracic;  Laterality: Right;     Social History:  The patient  reports that she quit smoking about 14 years ago. Her smoking use included cigarettes. She has a 35.00 pack-year smoking history. She has been exposed to tobacco smoke. She has never used smokeless tobacco. She reports current alcohol use. She reports that she does not use drugs.   Family History:  The patient's family history includes Brain cancer in her mother; Breast cancer in her maternal aunt; Diabetes in her son; Emphysema  in her father; Heart disease in her paternal grandfather.    ROS:  Please see the history of present illness. All other systems are reviewed and  Negative to the above problem except as noted.    PHYSICAL EXAM: VS:  BP (!) 158/80   Pulse 81   Ht 5\' 1"  (1.549 m)   Wt 101 lb 9.6 oz (46.1 kg)   SpO2 96%   BMI 19.20 kg/m   GEN: Thin 76 yo in no acute distress  HEENT: normal  Neck: no JVD, carotid bruits, Cardiac: RRR; no murmurs  No LE edema  Respiratory:  clear to auscultation bilaterally, GI: soft, nontender, nondistended, + BS  No hepatomegaly  MS: no deformity Moving all extremities   Skin: warm and dry, no rash Neuro:  Strength and sensation are intact Psych: euthymic mood, full affect   EKG:  EKG is ordered today.  SR 85 bpm      Lipid Panel No  results found for: "CHOL", "TRIG", "HDL", "CHOLHDL", "VLDL", "LDLCALC", "LDLDIRECT"    Wt Readings from Last 3 Encounters:  02/27/22 101 lb 9.6 oz (46.1 kg)  11/13/21 108 lb 12.8 oz (49.4 kg)  10/20/21 109 lb (49.4 kg)      ASSESSMENT AND PLAN:  1  Atrial fibrllation     Remains in SR   Never sensed palpitaitons    Will set up for a 30 day monitor   Not on anticoagulation  Need to get records from GI   Had not been on anticoagulation due to concern for bleeding   2 Atherosclerosis   Will get lipids today   no symptoms of angina   3  Anemia   WIll check CBC today        Current medicines are reviewed at length with the patient today.  The patient does not have concerns regarding medicines.  Signed, Dorris Carnes, MD  02/27/2022 11:53 AM    [address], [place], [place]  [address] Phone: [phone]; Fax: [phone]

## 2022-02-28 LAB — NMR, LIPOPROFILE
Cholesterol, Total: 182 mg/dL (ref 100–199)
HDL Cholesterol by NMR: 49 mg/dL (ref >39)
HDL Particle Number: 21.1 umol/L — ABNORMAL LOW (ref >=30.5)
LDL Particle Number: 1487 nmol/L — ABNORMAL HIGH (ref <1000)
LDL Size: 21.5 nm (ref >20.5)
LDL-C (NIH Calc): 117 mg/dL — ABNORMAL HIGH (ref 0–99)
LP-IR Score: <25 (ref <=45)
Small LDL Particle Number: 408 nmol/L (ref <=527)
Triglycerides by NMR: 88 mg/dL (ref 0–149)

## 2022-02-28 LAB — LIPOPROTEIN A (LPA): Lipoprotein (a): 26.9 nmol/L (ref <75.0)

## 2022-03-03 ENCOUNTER — Telehealth: Payer: Self-pay | Admitting: Internal Medicine

## 2022-03-03 NOTE — Telephone Encounter (Signed)
Patient returned call for lab results. Patient stated a detail message can be left on her voice mail.

## 2022-03-04 ENCOUNTER — Telehealth: Payer: Self-pay | Admitting: *Deleted

## 2022-03-04 DIAGNOSIS — Z79899 Other long term (current) drug therapy: Secondary | ICD-10-CM

## 2022-03-04 DIAGNOSIS — Z681 Body mass index (BMI) 19 or less, adult: Secondary | ICD-10-CM | POA: Diagnosis not present

## 2022-03-04 DIAGNOSIS — Z1231 Encounter for screening mammogram for malignant neoplasm of breast: Secondary | ICD-10-CM | POA: Diagnosis not present

## 2022-03-04 DIAGNOSIS — Z01419 Encounter for gynecological examination (general) (routine) without abnormal findings: Secondary | ICD-10-CM | POA: Diagnosis not present

## 2022-03-04 MED ORDER — ROSUVASTATIN CALCIUM 10 MG PO TABS: 10.0000 mg | ORAL_TABLET | Freq: Every day | ORAL | 11 refills | Status: DC

## 2022-03-04 NOTE — Telephone Encounter (Signed)
Pt notified and orders placed  

## 2022-03-04 NOTE — Telephone Encounter (Signed)
-----   Message from Stephani Police, RN sent at 03/03/2022  9:33 AM EST -----  ----- Message ----- From: Fay Records, MD Sent: 03/02/2022   9:18 PM EST To: Stephani Police, RN  LDL is 117   CT scans of chest have showed atherosclerosis of aorta. I would reocmm Crestor 10 mg    Follow up lipomed panel and liver panel in 8 wks  Goal LDL in 70s    Hgb is better than it was 4 months ago   Still mildly anemic Recomm follow up CBC in 8 wks as well

## 2022-03-04 NOTE — Telephone Encounter (Signed)
Results reviewed and pt agrees with plan of care.

## 2022-03-10 ENCOUNTER — Ambulatory Visit: Payer: Medicare PPO | Attending: Internal Medicine

## 2022-03-10 DIAGNOSIS — I480 Paroxysmal atrial fibrillation: Secondary | ICD-10-CM

## 2022-04-01 ENCOUNTER — Telehealth: Payer: Self-pay | Admitting: Internal Medicine

## 2022-04-01 NOTE — Telephone Encounter (Signed)
Patient called to ask if office had any extra strips for monitor. Unfortunately, office has tossed out strips d/t being expired. Pt will contact BS to see about having strip sent over-night to complete monitor.

## 2022-04-01 NOTE — Telephone Encounter (Signed)
Follow Up:    Please call, questions about the Monitor that she is wearing.

## 2022-04-06 ENCOUNTER — Inpatient Hospital Stay: Payer: Medicare PPO | Attending: Nurse Practitioner

## 2022-04-06 ENCOUNTER — Ambulatory Visit (HOSPITAL_COMMUNITY)
Admission: RE | Admit: 2022-04-06 | Discharge: 2022-04-06 | Disposition: A | Discharge status: Home / Self Care | Payer: Medicare PPO | Source: Ambulatory Visit | Attending: Internal Medicine | Admitting: Internal Medicine

## 2022-04-06 ENCOUNTER — Other Ambulatory Visit: Payer: Self-pay

## 2022-04-06 DIAGNOSIS — Z85118 Personal history of other malignant neoplasm of bronchus and lung: Secondary | ICD-10-CM | POA: Insufficient documentation

## 2022-04-06 DIAGNOSIS — C3490 Malignant neoplasm of unspecified part of unspecified bronchus or lung: Secondary | ICD-10-CM | POA: Diagnosis not present

## 2022-04-06 DIAGNOSIS — J90 Pleural effusion, not elsewhere classified: Secondary | ICD-10-CM | POA: Diagnosis not present

## 2022-04-06 DIAGNOSIS — Z9221 Personal history of antineoplastic chemotherapy: Secondary | ICD-10-CM | POA: Insufficient documentation

## 2022-04-06 DIAGNOSIS — R918 Other nonspecific abnormal finding of lung field: Secondary | ICD-10-CM | POA: Diagnosis not present

## 2022-04-06 DIAGNOSIS — D649 Anemia, unspecified: Secondary | ICD-10-CM | POA: Insufficient documentation

## 2022-04-06 DIAGNOSIS — Z9223 Personal history of estrogen therapy: Secondary | ICD-10-CM | POA: Insufficient documentation

## 2022-04-06 DIAGNOSIS — J439 Emphysema, unspecified: Secondary | ICD-10-CM | POA: Diagnosis not present

## 2022-04-06 LAB — CBC WITH DIFFERENTIAL (CANCER CENTER ONLY)
Abs Immature Granulocytes: 0.02 10*3/uL (ref 0.00–0.07)
Basophils Absolute: 0.1 10*3/uL (ref 0.0–0.1)
Basophils Relative: 1 %
Eosinophils Absolute: 0 10*3/uL (ref 0.0–0.5)
Eosinophils Relative: 1 %
HCT: 32.4 % — ABNORMAL LOW (ref 36.0–46.0)
Hemoglobin: 11 g/dL — ABNORMAL LOW (ref 12.0–15.0)
Immature Granulocytes: 1 %
Lymphocytes Relative: 13 %
Lymphs Abs: 0.5 10*3/uL — ABNORMAL LOW (ref 0.7–4.0)
MCH: 31 pg (ref 26.0–34.0)
MCHC: 34 g/dL (ref 30.0–36.0)
MCV: 91.3 fL (ref 80.0–100.0)
Monocytes Absolute: 0.8 10*3/uL (ref 0.1–1.0)
Monocytes Relative: 19 %
Neutro Abs: 2.7 10*3/uL (ref 1.7–7.7)
Neutrophils Relative %: 65 %
Platelet Count: 135 10*3/uL — ABNORMAL LOW (ref 150–400)
RBC: 3.55 MIL/uL — ABNORMAL LOW (ref 3.87–5.11)
RDW: 14.8 % (ref 11.5–15.5)
WBC Count: 4.1 10*3/uL (ref 4.0–10.5)
nRBC: 0 % (ref 0.0–0.2)

## 2022-04-06 LAB — CMP (CANCER CENTER ONLY)
ALT: 7 U/L (ref 0–44)
AST: 15 U/L (ref 15–41)
Albumin: 3.8 g/dL (ref 3.5–5.0)
Alkaline Phosphatase: 70 U/L (ref 38–126)
Anion gap: 3 — ABNORMAL LOW (ref 5–15)
BUN: 10 mg/dL (ref 8–23)
CO2: 32 mmol/L (ref 22–32)
Calcium: 10.2 mg/dL (ref 8.9–10.3)
Chloride: 103 mmol/L (ref 98–111)
Creatinine: 0.81 mg/dL (ref 0.44–1.00)
GFR, Estimated: >60 mL/min (ref >60)
Glucose, Bld: 78 mg/dL (ref 70–99)
Potassium: 3.7 mmol/L (ref 3.5–5.1)
Sodium: 138 mmol/L (ref 135–145)
Total Bilirubin: 0.6 mg/dL (ref 0.3–1.2)
Total Protein: 6.5 g/dL (ref 6.5–8.1)

## 2022-04-06 MED ORDER — IOHEXOL 300 MG/ML  SOLN
75.0000 mL | Freq: Once | INTRAMUSCULAR | Status: AC | PRN
  Administered 2022-04-06: 75 mL via INTRAVENOUS

## 2022-04-09 ENCOUNTER — Inpatient Hospital Stay (HOSPITAL_BASED_OUTPATIENT_CLINIC_OR_DEPARTMENT_OTHER): Payer: Medicare PPO | Admitting: Internal Medicine

## 2022-04-09 VITALS — BP 166/65 | HR 80 | Temp 97.8°F | Resp 15 | Wt 101.7 lb

## 2022-04-09 DIAGNOSIS — C3490 Malignant neoplasm of unspecified part of unspecified bronchus or lung: Secondary | ICD-10-CM | POA: Diagnosis not present

## 2022-04-09 DIAGNOSIS — Z9223 Personal history of estrogen therapy: Secondary | ICD-10-CM | POA: Diagnosis not present

## 2022-04-09 DIAGNOSIS — Z9221 Personal history of antineoplastic chemotherapy: Secondary | ICD-10-CM | POA: Diagnosis not present

## 2022-04-09 DIAGNOSIS — D649 Anemia, unspecified: Secondary | ICD-10-CM | POA: Diagnosis not present

## 2022-04-09 DIAGNOSIS — Z85118 Personal history of other malignant neoplasm of bronchus and lung: Secondary | ICD-10-CM | POA: Diagnosis not present

## 2022-04-09 DIAGNOSIS — R918 Other nonspecific abnormal finding of lung field: Secondary | ICD-10-CM | POA: Diagnosis not present

## 2022-04-09 NOTE — Progress Notes (Signed)
[place] [phone]   [phone]  OFFICE PROGRESS NOTE  [name], MD [address] [place] [address]  PRINCIPAL DIAGNOSIS: recurrent small cell lung cancer initially diagnosed as Limited stage small cell lung cancer in June 2011, with recurrence in June 2015.   PRIOR THERAPY:  Status post 4 cycles of systemic chemotherapy with carboplatin and etoposide. Last dose was given 11/14/2009. This was concurrent with radiotherapy. Status post prophylactic cranial irradiation completed January 28, 2010. Status post wedge resection of the right upper lobe lung nodule under the care of Dr. Cyndia Bent on 09/21/2013 and the final pathology was consistent with small cell lung cancer. systemic chemotherapy with carboplatin for AUC of 5 on day 1 and etoposide 120 mg/M2 on days 1, 2 and 3 with Neulasta support on day 4 every 3 weeks. Status post 4 cycles. First dose on 10/30/2013.  CURRENT THERAPY: Observation.  INTERVAL HISTORY: Claudia [name] 77 y.o. female returns to the clinic today for 6 months follow-up visit accompanied by her husband.  The patient is feeling fine today with no concerning complaints except for a few pounds of weight loss.  She is dealing with a dysmotility issues and followed by gastroenterology.  She denied having any current chest pain, shortness of breath, cough or hemoptysis.  She has no nausea, vomiting, diarrhea or constipation.  She has no headache or visual changes.  She has no recent weight loss or night sweats.  She is here today for evaluation with repeat CT scan of the chest for restaging of her disease.  MEDICAL HISTORY: Past Medical History:  Diagnosis Date   Anemia    Arthritis    Bradycardia    mild,may be due to beta blocker therapy   COPD (chronic obstructive pulmonary disease) (HCC)    GERD (gastroesophageal reflux disease)    otc   Hypertension 06/01/2019   pt denies   Local recurrence of lung cancer ([place]) dx'd  07/2013   rt thoracotomy chemo comp 12/2013   Lung cancer ([place]) 03/30/2008   Dr. Julien Nordmann, finished chemo, sp radiation, left upper    Lymphocytic colitis    Pneumonia     ALLERGIES:  is allergic to azithromycin, tussionex pennkinetic er [hydrocod poli-chlorphe poli er], and omeprazole.  MEDICATIONS:  Current Outpatient Medications  Medication Sig Dispense Refill   Calcium Carbonate Antacid (TUMS PO) Take 500-1,000 mg by mouth 3 (three) times daily as needed (indigestion/heartburn.).     Cholecalciferol (VITAMIN D-3) 25 MCG (1000 UT) CAPS Take 1,000 Units by mouth daily.     Coenzyme Q10 100 MG TABS Take 100 mg by mouth daily.     dronedarone (MULTAQ) 400 MG tablet Take 1 tablet (400 mg total) by mouth 2 (two) times daily with a meal. (Patient not taking: Reported on 02/27/2022) 60 tablet 3   ferrous sulfate 325 (65 FE) MG tablet Take 1 tablet (325 mg total) by mouth daily. (Patient not taking: Reported on 11/13/2021) 30 tablet 3   pantoprazole (PROTONIX) 40 MG tablet Take 1 tablet (40 mg total) by mouth daily. (Patient not taking: Reported on 11/13/2021) 30 tablet 0   Probiotic Product (PROBIOTIC DAILY PO) Take 1 capsule by mouth daily.     rosuvastatin (CRESTOR) 10 MG tablet Take 1 tablet (10 mg total) by mouth daily. 30 tablet 11   tretinoin (RETIN-A) 0.1 % cream Apply 1 application  topically at bedtime.     Turmeric Curcumin 500 MG CAPS Take  500 mg by mouth daily.     No current facility-administered medications for this visit.    SURGICAL HISTORY:  Past Surgical History:  Procedure Laterality Date   BRONCHOSCOPY  2011   DILATION AND CURETTAGE OF UTERUS     ESOPHAGOGASTRODUODENOSCOPY (EGD) WITH PROPOFOL N/A 07/29/2021   Procedure: ESOPHAGOGASTRODUODENOSCOPY (EGD) WITH PROPOFOL;  Surgeon: Harvel Quale, MD;  Location: [facility];  Service: Gastroenterology;  Laterality: N/A;  1235 ASA 2   NECK SURGERY  1980's   THORACOTOMY Right 09/21/2013   Procedure: THORACOTOMY  MAJOR;  Surgeon: Gaye Pollack, MD;  Location: [facility] OR;  Service: Thoracic;  Laterality: Right;   UTERINE FIBROID SURGERY  2012   WEDGE RESECTION Right 09/21/2013   Procedure: RIGHT UPPER LOBE WEDGE RESECTION;  Surgeon: Gaye Pollack, MD;  Location: [facility];  Service: Thoracic;  Laterality: Right;    REVIEW OF SYSTEMS:  Constitutional: positive for fatigue Eyes: negative Ears, nose, mouth, throat, and face: negative Respiratory: negative Cardiovascular: negative Gastrointestinal: positive for delayed gastric emptying Genitourinary:negative Integument/breast: negative Hematologic/lymphatic: negative Musculoskeletal:negative Neurological: negative Behavioral/Psych: negative Endocrine: negative Allergic/Immunologic: negative   PHYSICAL EXAMINATION: General appearance: alert, cooperative, fatigued, and no distress Head: Normocephalic, without obvious abnormality, atraumatic Neck: no adenopathy Lymph nodes: Cervical, supraclavicular, and axillary nodes normal. Resp: clear to auscultation bilaterally Back: symmetric, no curvature. ROM normal. No CVA tenderness. Cardio: regular rate and rhythm, S1, S2 normal, no murmur, click, rub or gallop GI: soft, non-tender; bowel sounds normal; no masses,  no organomegaly Extremities: extremities normal, atraumatic, no cyanosis or edema Neurologic: Alert and oriented X 3, normal strength and tone. Normal symmetric reflexes. Normal coordination and gait  ECOG PERFORMANCE STATUS: 1 - Symptomatic but completely ambulatory  Blood pressure (!) 166/65, pulse 80, temperature 97.8 F (36.6 C), temperature source Oral, resp. rate 15, weight 101 lb 11.2 oz (46.1 kg), SpO2 98 %.  LABORATORY DATA: Lab Results  Component Value Date   WBC 4.1 04/06/2022   HGB 11.0 (L) 04/06/2022   HCT 32.4 (L) 04/06/2022   MCV 91.3 04/06/2022   PLT 135 (L) 04/06/2022      Chemistry      Component Value Date/Time   NA 138 04/06/2022 1104   NA 141 11/02/2016 0919   K  3.7 04/06/2022 1104   K 4.2 11/02/2016 0919   CL 103 04/06/2022 1104   CL 103 08/03/2012 0842   CO2 32 04/06/2022 1104   CO2 29 11/02/2016 0919   BUN 10 04/06/2022 1104   BUN 13.9 11/02/2016 0919   CREATININE 0.81 04/06/2022 1104   CREATININE 1.0 11/02/2016 0919      Component Value Date/Time   CALCIUM 10.2 04/06/2022 1104   CALCIUM 10.9 (H) 11/02/2016 0919   ALKPHOS 70 04/06/2022 1104   ALKPHOS 78 11/02/2016 0919   AST 15 04/06/2022 1104   AST 19 11/02/2016 0919   ALT 7 04/06/2022 1104   ALT 12 11/02/2016 0919   BILITOT 0.6 04/06/2022 1104   BILITOT 0.47 11/02/2016 0919       RADIOGRAPHIC STUDIES: CT Chest W Contrast  Result Date: 04/07/2022 CLINICAL DATA:  Small cell lung cancer staging evaluation in a 77 year old female. * Tracking Code: BO * EXAM: CT CHEST WITH CONTRAST TECHNIQUE: Multidetector CT imaging of the chest was performed during intravenous contrast administration. RADIATION DOSE REDUCTION: This exam was performed according to the departmental dose-optimization program which includes automated exposure control, adjustment of the mA and/or kV according to patient size and/or use of  iterative reconstruction technique. CONTRAST:  69mL OMNIPAQUE IOHEXOL 300 MG/ML  SOLN COMPARISON:  October 07, 2021 FINDINGS: Cardiovascular: Calcified and noncalcified aortic atherosclerotic plaque not substantially changed compared to previous imaging. Heart size is normal. Mild shift from RIGHT to LEFT due to post treatment changes in the LEFT chest. Central pulmonary vasculature is unremarkable to the extent evaluated. Mediastinum/Nodes: Patulous esophagus similar to prior imaging small amount of debris in the distal esophagus. No thoracic inlet, axillary or signs of mediastinal lymphadenopathy. Lungs/Pleura: Mild septal thickening and subtle areas of nodularity have developed in the aerated portions of the LEFT upper chest since previous imaging (image 40/7) 4 mm LEFT nodule. (Image 38/7) 6 mm  nodule in the LEFT upper chest. (Image 32/7) 4 mm nodule in the LEFT upper chest. Collapse of the LEFT upper lobe. Developing convex margin along the leading surface of the collapsed lung amidst small LEFT-sided pleural effusion. Pleural fluid is similar to the prior study. This convex margin along the anterior lung (image 51/2) is a new finding measuring 13 mm greatest thickness. Additionally, within collapsed lingula there is more pronounced enhancement and a 17 x 17 mm area is of ovoid nodular density amidst collapsed lung along the LEFT mediastinal border (image 76/2) 2 mm RIGHT upper lobe pulmonary nodule is unchanged as are other scattered small nodules in the RIGHT upper lobe. Largest of these nodules is in the RIGHT lower lobe on image 92/7 measuring 5 mm. There are tree-in-bud nodules in the LEFT lower chest that have increased since previous imaging largest measuring 8 x 7 mm (image 114/7) postoperative changes in the RIGHT chest related to partial lung resection are also similar. Pleural fluid in the LEFT chest has decreased and is now resolved in the LEFT lower chest. Pulmonary emphysema as on previous imaging. Upper Abdomen: Incidental imaging of upper abdominal contents shows no acute process to the extent evaluated. Imaged portions the liver, gallbladder, pancreas, spleen and adrenal glands are unremarkable. Musculoskeletal: No acute musculoskeletal findings. Spinal degenerative changes. Osteopenia. IMPRESSION: 1. Areas amidst post treatment change in collapsed lung in the LEFT upper lobe that raise the question of disease recurrence and or early metastasis. PET scan may be helpful for further evaluation. 2. Scattered small nodules throughout the LEFT chest have increased and or are new since previous imaging. Based on basilar distribution of some of these findings would also consider the possibility of aspiration related changes. Patulous debris-filled esophagus further increasing the possibility of  this alternative. 3. Diminished pleural fluid in the LEFT chest since previous imaging in the LEFT lower chest 4. Stable postoperative changes in the RIGHT chest related to partial lung resection along with stable small RIGHT pulmonary nodules. Aortic Atherosclerosis (ICD10-I70.0) and Emphysema (ICD10-J43.9). Electronically Signed   By: Zetta Bills M.D.   On: 04/07/2022 08:58    ASSESSMENT AND PLAN:  This is a very pleasant 77 years old white female with recurrent small cell lung cancer that was initially diagnosed in June 2011. She underwent systemic chemotherapy was carboplatin and etoposide concurrent with radiation and followed by prophylactic cranial irradiation. The patient was on observation since November 2011 that she had recurrence with right upper lobe nodule in June 2015 status post wedge resection that was consistent with recurrent small cell lung cancer. She was then treated with systemic chemotherapy again with carboplatin and etoposide for 4 cycles. She is currently on observe patient and feeling fine with no concerning complaints except for the delayed gastric emptying.  She is followed  by gastroenterology. The patient had repeat CT scan of the chest performed recently.  I personally and independently reviewed the scan images and discussed the result and showed the images to the patient today. Her scan showed some changes in the collapsed left upper lobe concerning for disease recurrence with scattered small nodules throughout the left lung that has either increased or new from the previous imaging studies. I recommended for the patient to have a PET scan for further evaluation of these lesions and to rule out disease recurrence. I will see her back for follow-up visit in 3 weeks for evaluation and discussion of her PET scan results and further recommendation regarding her condition. The patient was advised to call immediately if she has any other concerning symptoms in the  interval. For the anemia, I gave the patient a stool cards to rule out any Hemoccult in her stool. For the suspicious renal mass she is followed by urology.   All questions were answered. The patient knows to call the clinic with any problems, questions or concerns. We can certainly see the patient much sooner if necessary.  Disclaimer: This note was dictated with voice recognition software. Similar sounding words can inadvertently be transcribed and may not be corrected upon review.

## 2022-04-13 ENCOUNTER — Telehealth: Payer: Self-pay | Admitting: Internal Medicine

## 2022-04-13 NOTE — Telephone Encounter (Signed)
Called patient regarding upcoming January appointments, patient is notified.

## 2022-04-22 ENCOUNTER — Other Ambulatory Visit: Payer: Self-pay | Admitting: Medical Oncology

## 2022-04-22 DIAGNOSIS — C3490 Malignant neoplasm of unspecified part of unspecified bronchus or lung: Secondary | ICD-10-CM

## 2022-04-23 ENCOUNTER — Inpatient Hospital Stay: Payer: Medicare PPO

## 2022-04-23 ENCOUNTER — Other Ambulatory Visit: Payer: Medicare PPO

## 2022-04-23 ENCOUNTER — Encounter (HOSPITAL_COMMUNITY)
Admission: RE | Admit: 2022-04-23 | Discharge: 2022-04-23 | Disposition: A | Discharge status: Home / Self Care | Payer: Medicare PPO | Source: Ambulatory Visit | Attending: Internal Medicine | Admitting: Internal Medicine

## 2022-04-23 DIAGNOSIS — D492 Neoplasm of unspecified behavior of bone, soft tissue, and skin: Secondary | ICD-10-CM | POA: Diagnosis not present

## 2022-04-23 DIAGNOSIS — C3490 Malignant neoplasm of unspecified part of unspecified bronchus or lung: Secondary | ICD-10-CM

## 2022-04-23 DIAGNOSIS — R918 Other nonspecific abnormal finding of lung field: Secondary | ICD-10-CM | POA: Diagnosis not present

## 2022-04-23 LAB — CMP (CANCER CENTER ONLY)
ALT: 8 U/L (ref 0–44)
AST: 14 U/L — ABNORMAL LOW (ref 15–41)
Albumin: 3.7 g/dL (ref 3.5–5.0)
Alkaline Phosphatase: 73 U/L (ref 38–126)
Anion gap: 4 — ABNORMAL LOW (ref 5–15)
BUN: 16 mg/dL (ref 8–23)
CO2: 31 mmol/L (ref 22–32)
Calcium: 10.2 mg/dL (ref 8.9–10.3)
Chloride: 104 mmol/L (ref 98–111)
Creatinine: 0.86 mg/dL (ref 0.44–1.00)
GFR, Estimated: >60 mL/min (ref >60)
Glucose, Bld: 76 mg/dL (ref 70–99)
Potassium: 3.9 mmol/L (ref 3.5–5.1)
Sodium: 139 mmol/L (ref 135–145)
Total Bilirubin: 0.6 mg/dL (ref 0.3–1.2)
Total Protein: 7 g/dL (ref 6.5–8.1)

## 2022-04-23 LAB — CBC WITH DIFFERENTIAL (CANCER CENTER ONLY)
Abs Immature Granulocytes: 0.01 10*3/uL (ref 0.00–0.07)
Basophils Absolute: 0 10*3/uL (ref 0.0–0.1)
Basophils Relative: 1 %
Eosinophils Absolute: 0 10*3/uL (ref 0.0–0.5)
Eosinophils Relative: 1 %
HCT: 32.8 % — ABNORMAL LOW (ref 36.0–46.0)
Hemoglobin: 11.2 g/dL — ABNORMAL LOW (ref 12.0–15.0)
Immature Granulocytes: 0 %
Lymphocytes Relative: 16 %
Lymphs Abs: 0.6 10*3/uL — ABNORMAL LOW (ref 0.7–4.0)
MCH: 31.4 pg (ref 26.0–34.0)
MCHC: 34.1 g/dL (ref 30.0–36.0)
MCV: 91.9 fL (ref 80.0–100.0)
Monocytes Absolute: 0.6 10*3/uL (ref 0.1–1.0)
Monocytes Relative: 15 %
Neutro Abs: 2.5 10*3/uL (ref 1.7–7.7)
Neutrophils Relative %: 67 %
Platelet Count: 133 10*3/uL — ABNORMAL LOW (ref 150–400)
RBC: 3.57 MIL/uL — ABNORMAL LOW (ref 3.87–5.11)
RDW: 14.9 % (ref 11.5–15.5)
WBC Count: 3.8 10*3/uL — ABNORMAL LOW (ref 4.0–10.5)
nRBC: 0 % (ref 0.0–0.2)

## 2022-04-23 LAB — GLUCOSE, CAPILLARY: Glucose-Capillary: 81 mg/dL (ref 70–99)

## 2022-04-23 MED ORDER — FLUDEOXYGLUCOSE F - 18 (FDG) INJECTION
5.3000 | Freq: Once | INTRAVENOUS | Status: AC
  Administered 2022-04-23: 5.3 via INTRAVENOUS

## 2022-04-26 NOTE — Progress Notes (Unsigned)
[facility] OFFICE PROGRESS NOTE  Claudia Chroman, MD [place] [address]  DIAGNOSIS: recurrent small cell lung cancer initially diagnosed as Limited stage small cell lung cancer in June 2011, with recurrence in June 2015. She also had disease recurrence in January 2024 in the left upper lobe with several hypermetabolic small pulmonary nodules in the left lung.   PRIOR THERAPY: Status post 4 cycles of systemic chemotherapy with carboplatin and etoposide. Last dose was given 11/14/2009. This was concurrent with radiotherapy. Status post prophylactic cranial irradiation completed January 28, 2010. Status post wedge resection of the right upper lobe lung nodule under the care of Dr. Cyndia Bent on 09/21/2013 and the final pathology was consistent with small cell lung cancer. systemic chemotherapy with carboplatin for AUC of 5 on day 1 and etoposide 120 mg/M2 on days 1, 2 and 3 with Neulasta support on day 4 every 3 weeks. Status post 4 cycles. First dose on 10/30/2013.  CURRENT THERAPY: ***  INTERVAL HISTORY: Claudia [name] 77 y.o. female returns to clinic today for follow-up visit accompanied by her husband.  The patient was seen on 04/09/2022 for surveillance imaging.  The patient scan at that time showed suspicious findings for disease recurrence with collapse of the left upper lobe and scattered small pulmonary nodules in the left lung.  The patient therefore had a PET scan to further evaluate this.  Since last being seen the patient denies any major changes in her health.  She continues to struggle with weight loss and GI dysmotility for which she sees gastroenterology.  She denies any fever, chills, night sweats, or unexplained weight loss.  She denies any chest pain, shortness of breath, cough, or hemoptysis.  Denies any nausea, vomiting, diarrhea, or constipation denies any headache or visual changes.  She is here today to review her PET scan results and for more detailed  discussion about her current condition and recommended plan  MEDICAL HISTORY: Past Medical History:  Diagnosis Date   Anemia    Arthritis    Bradycardia    mild,may be due to beta blocker therapy   COPD (chronic obstructive pulmonary disease) (HCC)    GERD (gastroesophageal reflux disease)    otc   Hypertension 06/01/2019   pt denies   Local recurrence of lung cancer ([place]) dx'd 07/2013   rt thoracotomy chemo comp 12/2013   Lung cancer ([place]) 03/30/2008   Dr. Julien Nordmann, finished chemo, sp radiation, left upper    Lymphocytic colitis    Pneumonia     ALLERGIES:  is allergic to azithromycin, tussionex pennkinetic er [hydrocod poli-chlorphe poli er], and omeprazole.  MEDICATIONS:  Current Outpatient Medications  Medication Sig Dispense Refill   Calcium Carbonate Antacid (TUMS PO) Take 500-1,000 mg by mouth 3 (three) times daily as needed (indigestion/heartburn.).     Cholecalciferol (VITAMIN D-3) 25 MCG (1000 UT) CAPS Take 1,000 Units by mouth daily.     Coenzyme Q10 100 MG TABS Take 100 mg by mouth daily.     dronedarone (MULTAQ) 400 MG tablet Take 1 tablet (400 mg total) by mouth 2 (two) times daily with a meal. (Patient not taking: Reported on 02/27/2022) 60 tablet 3   ferrous sulfate 325 (65 FE) MG tablet Take 1 tablet (325 mg total) by mouth daily. (Patient not taking: Reported on 11/13/2021) 30 tablet 3   pantoprazole (PROTONIX) 40 MG tablet Take 1 tablet (40 mg total) by mouth daily. (Patient not taking: Reported on 11/13/2021) 30 tablet 0  Probiotic Product (PROBIOTIC DAILY PO) Take 1 capsule by mouth daily.     rosuvastatin (CRESTOR) 10 MG tablet Take 1 tablet (10 mg total) by mouth daily. 30 tablet 11   tretinoin (RETIN-A) 0.1 % cream Apply 1 application  topically at bedtime.     Turmeric Curcumin 500 MG CAPS Take 500 mg by mouth daily.     No current facility-administered medications for this visit.    SURGICAL HISTORY:  Past Surgical History:  Procedure Laterality Date    BRONCHOSCOPY  2011   DILATION AND CURETTAGE OF UTERUS     ESOPHAGOGASTRODUODENOSCOPY (EGD) WITH PROPOFOL N/A 07/29/2021   Procedure: ESOPHAGOGASTRODUODENOSCOPY (EGD) WITH PROPOFOL;  Surgeon: Harvel Quale, MD;  Location: [facility];  Service: Gastroenterology;  Laterality: N/A;  1235 ASA 2   NECK SURGERY  1980's   THORACOTOMY Right 09/21/2013   Procedure: THORACOTOMY MAJOR;  Surgeon: Gaye Pollack, MD;  Location: [facility] OR;  Service: Thoracic;  Laterality: Right;   UTERINE FIBROID SURGERY  2012   WEDGE RESECTION Right 09/21/2013   Procedure: RIGHT UPPER LOBE WEDGE RESECTION;  Surgeon: Gaye Pollack, MD;  Location: [facility] OR;  Service: Thoracic;  Laterality: Right;    REVIEW OF SYSTEMS:   Review of Systems  Constitutional: Negative for appetite change, chills, fatigue, fever and unexpected weight change.  HENT:   Negative for mouth sores, nosebleeds, sore throat and trouble swallowing.   Eyes: Negative for eye problems and icterus.  Respiratory: Negative for cough, hemoptysis, shortness of breath and wheezing.   Cardiovascular: Negative for chest pain and leg swelling.  Gastrointestinal: Negative for abdominal pain, constipation, diarrhea, nausea and vomiting.  Genitourinary: Negative for bladder incontinence, difficulty urinating, dysuria, frequency and hematuria.   Musculoskeletal: Negative for back pain, gait problem, neck pain and neck stiffness.  Skin: Negative for itching and rash.  Neurological: Negative for dizziness, extremity weakness, gait problem, headaches, light-headedness and seizures.  Hematological: Negative for adenopathy. Does not bruise/bleed easily.  Psychiatric/Behavioral: Negative for confusion, depression and sleep disturbance. The patient is not nervous/anxious.     PHYSICAL EXAMINATION:  There were no vitals taken for this visit.  ECOG PERFORMANCE STATUS: {CHL ONC ECOG [id number]  Physical Exam  Constitutional: Oriented to person, place, and time  and well-developed, well-nourished, and in no distress. No distress.  HENT:  Head: Normocephalic and atraumatic.  Mouth/Throat: Oropharynx is clear and moist. No oropharyngeal exudate.  Eyes: Conjunctivae are normal. Right eye exhibits no discharge. Left eye exhibits no discharge. No scleral icterus.  Neck: Normal range of motion. Neck supple.  Cardiovascular: Normal rate, regular rhythm, normal heart sounds and intact distal pulses.   Pulmonary/Chest: Effort normal and breath sounds normal. No respiratory distress. No wheezes. No rales.  Abdominal: Soft. Bowel sounds are normal. Exhibits no distension and no mass. There is no tenderness.  Musculoskeletal: Normal range of motion. Exhibits no edema.  Lymphadenopathy:    No cervical adenopathy.  Neurological: Alert and oriented to person, place, and time. Exhibits normal muscle tone. Gait normal. Coordination normal.  Skin: Skin is warm and dry. No rash noted. Not diaphoretic. No erythema. No pallor.  Psychiatric: Mood, memory and judgment normal.  Vitals reviewed.  LABORATORY DATA: Lab Results  Component Value Date   WBC 3.8 (L) 04/23/2022   HGB 11.2 (L) 04/23/2022   HCT 32.8 (L) 04/23/2022   MCV 91.9 04/23/2022   PLT 133 (L) 04/23/2022      Chemistry      Component Value Date/Time  NA 139 04/23/2022 1355   NA 141 11/02/2016 0919   K 3.9 04/23/2022 1355   K 4.2 11/02/2016 0919   CL 104 04/23/2022 1355   CL 103 08/03/2012 0842   CO2 31 04/23/2022 1355   CO2 29 11/02/2016 0919   BUN 16 04/23/2022 1355   BUN 13.9 11/02/2016 0919   CREATININE 0.86 04/23/2022 1355   CREATININE 1.0 11/02/2016 0919      Component Value Date/Time   CALCIUM 10.2 04/23/2022 1355   CALCIUM 10.9 (H) 11/02/2016 0919   ALKPHOS 73 04/23/2022 1355   ALKPHOS 78 11/02/2016 0919   AST 14 (L) 04/23/2022 1355   AST 19 11/02/2016 0919   ALT 8 04/23/2022 1355   ALT 12 11/02/2016 0919   BILITOT 0.6 04/23/2022 1355   BILITOT 0.47 11/02/2016 0919        RADIOGRAPHIC STUDIES:  NM PET Image Restage (PS) Skull Base to Thigh (F-18 FDG)  Result Date: 04/24/2022 CLINICAL DATA:  Subsequent treatment strategy for non-small cell lung cancer. EXAM: NUCLEAR MEDICINE PET SKULL BASE TO THIGH TECHNIQUE: 5.3 mCi F-18 FDG was injected intravenously. Full-ring PET imaging was performed from the skull base to thigh after the radiotracer. CT data was obtained and used for attenuation correction and anatomic localization. Fasting blood glucose: 81 mg/dl COMPARISON:  Numerous previous imaging studies. Most recent chest CT is 05-01-22. FINDINGS: Mediastinal blood pool activity: SUV max 2.13 Liver activity: SUV max NA NECK: No hypermetabolic lymph nodes in the neck. Incidental CT findings: None. CHEST: Areas of marked hypermetabolism in the medial and anterior aspect of the left upper lobe consistent with extensive recurrent neoplasm. SUV max is 18.51. There are also several hypermetabolic pulmonary nodules in the left lung consistent with metastatic disease. 9 mm lesion in the left lower lobe on image number 67/4 has an SUV max of 5.42. Peripheral 8 mm left upper lobe nodule on image 55/4 has an SUV max of 5.0 a Small pulmonary nodules in the right lung are also likely metastatic. No hypermetabolic mediastinal hilar lymph nodes. No hypermetabolic breast masses, supraclavicular or axillary adenopathy. Incidental CT findings: Stable marked dilatation of the esophagus which is filled with fluid and air and likely due to achalasia. ABDOMEN/PELVIS: No findings suspicious for/pelvic metastatic disease. Incidental CT findings: Stable advanced vascular calcifications. SKELETON: No findings suspicious for osseous metastatic disease. Incidental CT findings: None. IMPRESSION: 1. Extensive recurrent tumor in the left lung. 2. Hypermetabolic left lung nodules consistent with metastatic disease. 3. No mediastinal or hilar adenopathy. 4. No findings for metastatic disease involving the  abdomen/pelvis or bony structures. 5. Stable marked dilatation of the esophagus likely due to achalasia. Electronically Signed   By: Marijo Sanes M.D.   On: 04/24/2022 12:44   CT Chest W Contrast  Result Date: 04/07/2022 CLINICAL DATA:  Small cell lung cancer staging evaluation in a 77 year old female. * Tracking Code: BO * EXAM: CT CHEST WITH CONTRAST TECHNIQUE: Multidetector CT imaging of the chest was performed during intravenous contrast administration. RADIATION DOSE REDUCTION: This exam was performed according to the departmental dose-optimization program which includes automated exposure control, adjustment of the mA and/or kV according to patient size and/or use of iterative reconstruction technique. CONTRAST:  64mL OMNIPAQUE IOHEXOL 300 MG/ML  SOLN COMPARISON:  October 07, 2021 FINDINGS: Cardiovascular: Calcified and noncalcified aortic atherosclerotic plaque not substantially changed compared to previous imaging. Heart size is normal. Mild shift from RIGHT to LEFT due to post treatment changes in the LEFT chest. Central pulmonary vasculature  is unremarkable to the extent evaluated. Mediastinum/Nodes: Patulous esophagus similar to prior imaging small amount of debris in the distal esophagus. No thoracic inlet, axillary or signs of mediastinal lymphadenopathy. Lungs/Pleura: Mild septal thickening and subtle areas of nodularity have developed in the aerated portions of the LEFT upper chest since previous imaging (image 40/7) 4 mm LEFT nodule. (Image 38/7) 6 mm nodule in the LEFT upper chest. (Image 32/7) 4 mm nodule in the LEFT upper chest. Collapse of the LEFT upper lobe. Developing convex margin along the leading surface of the collapsed lung amidst small LEFT-sided pleural effusion. Pleural fluid is similar to the prior study. This convex margin along the anterior lung (image 51/2) is a new finding measuring 13 mm greatest thickness. Additionally, within collapsed lingula there is more pronounced  enhancement and a 17 x 17 mm area is of ovoid nodular density amidst collapsed lung along the LEFT mediastinal border (image 76/2) 2 mm RIGHT upper lobe pulmonary nodule is unchanged as are other scattered small nodules in the RIGHT upper lobe. Largest of these nodules is in the RIGHT lower lobe on image 92/7 measuring 5 mm. There are tree-in-bud nodules in the LEFT lower chest that have increased since previous imaging largest measuring 8 x 7 mm (image 114/7) postoperative changes in the RIGHT chest related to partial lung resection are also similar. Pleural fluid in the LEFT chest has decreased and is now resolved in the LEFT lower chest. Pulmonary emphysema as on previous imaging. Upper Abdomen: Incidental imaging of upper abdominal contents shows no acute process to the extent evaluated. Imaged portions the liver, gallbladder, pancreas, spleen and adrenal glands are unremarkable. Musculoskeletal: No acute musculoskeletal findings. Spinal degenerative changes. Osteopenia. IMPRESSION: 1. Areas amidst post treatment change in collapsed lung in the LEFT upper lobe that raise the question of disease recurrence and or early metastasis. PET scan may be helpful for further evaluation. 2. Scattered small nodules throughout the LEFT chest have increased and or are new since previous imaging. Based on basilar distribution of some of these findings would also consider the possibility of aspiration related changes. Patulous debris-filled esophagus further increasing the possibility of this alternative. 3. Diminished pleural fluid in the LEFT chest since previous imaging in the LEFT lower chest 4. Stable postoperative changes in the RIGHT chest related to partial lung resection along with stable small RIGHT pulmonary nodules. Aortic Atherosclerosis (ICD10-I70.0) and Emphysema (ICD10-J43.9). Electronically Signed   By: Zetta Bills M.D.   On: 04/07/2022 08:58     ASSESSMENT/PLAN:  This is a very pleasant 77 year old  Caucasian female with recurrent small cell lung cancer.  This was initially diagnosed in June 2011 she underwent systemic chemotherapy with carboplatin and etoposide with concurrent chemoradiation followed by prophylactic cranial radiation.  She had been on observation till 2015 which showed recurrence with a right upper lobe nodule and she underwent wide resection which was consistent with recurrent small cell lung cancer.  She was then treated with systemic chemotherapy with carboplatin and etoposide for 4 cycles.  The patient had been on observation since 2015 and feeling well except for delayed gastric emptying for which she sees gastroenterology.  In January 2024 the patient had repeat CT scan of the chest that showed suspicious findings for disease recurrence with collapse of the left upper lobe and scattered small nodules throughout the left lung.  The patient had a PET scan to further evaluate this.  Dr. Julien Nordmann present independently reviewed the scan and discussed the results with the  patient today.  The scan showed suspicious findings for disease recurrence with hypermetabolism in the left upper lobe as well as hypermetabolic scattered pulmonary nodules consistent with metastatic disease.  There is no adenopathy or disease outside the chest.  The patient was seen with Dr. Julien Nordmann today.  Dr. Julien Nordmann a lengthy discussion with the patient today about her current condition and recommended treatment options.  Dr. Julien Nordmann recommends undergoing biopsy to confirm small cell recurrence versus secondary lung cancer.  If this is consistent with small cell lung cancer then treatment would be palliative systemic chemotherapy with carboplatin for an AUC of 5 on day 1 etoposide 100 mg/m on days 1, 2, and 3 and Imfinzi 1500 mg on day 1 IV every 3 weeks.  Follow-up  Prescriptions  Port  Order brain mri?  The patient was advised to call immediately if she has any concerning symptoms in the  interval. The patient voices understanding of current disease status and treatment options and is in agreement with the current care plan. All questions were answered. The patient knows to call the clinic with any problems, questions or concerns. We can certainly see the patient much sooner if necessary        No orders of the defined types were placed in this encounter.    I spent {CHL ONC TIME VISIT - SELTR:[id number]} counseling the patient face to face. The total time spent in the appointment was {CHL ONC TIME VISIT - YSHUO:[id number]}.  Jeno Calleros L Mardell Cragg, PA-C 04/26/22

## 2022-04-27 ENCOUNTER — Inpatient Hospital Stay (HOSPITAL_BASED_OUTPATIENT_CLINIC_OR_DEPARTMENT_OTHER): Payer: Medicare PPO | Admitting: Physician Assistant

## 2022-04-27 DIAGNOSIS — C3490 Malignant neoplasm of unspecified part of unspecified bronchus or lung: Secondary | ICD-10-CM

## 2022-04-27 DIAGNOSIS — Z85118 Personal history of other malignant neoplasm of bronchus and lung: Secondary | ICD-10-CM | POA: Diagnosis not present

## 2022-04-27 DIAGNOSIS — R918 Other nonspecific abnormal finding of lung field: Secondary | ICD-10-CM

## 2022-04-30 ENCOUNTER — Other Ambulatory Visit (HOSPITAL_COMMUNITY)
Admission: RE | Admit: 2022-04-30 | Discharge: 2022-04-30 | Disposition: A | Discharge status: Home / Self Care | Payer: Medicare PPO | Source: Ambulatory Visit | Attending: Physician Assistant | Admitting: Physician Assistant

## 2022-04-30 DIAGNOSIS — Z79899 Other long term (current) drug therapy: Secondary | ICD-10-CM | POA: Insufficient documentation

## 2022-04-30 DIAGNOSIS — C3490 Malignant neoplasm of unspecified part of unspecified bronchus or lung: Secondary | ICD-10-CM | POA: Diagnosis not present

## 2022-04-30 LAB — CBC
HCT: 33.8 % — ABNORMAL LOW (ref 36.0–46.0)
Hemoglobin: 11 g/dL — ABNORMAL LOW (ref 12.0–15.0)
MCH: 30.8 pg (ref 26.0–34.0)
MCHC: 32.5 g/dL (ref 30.0–36.0)
MCV: 94.7 fL (ref 80.0–100.0)
Platelets: 134 10*3/uL — ABNORMAL LOW (ref 150–400)
RBC: 3.57 MIL/uL — ABNORMAL LOW (ref 3.87–5.11)
RDW: 14.9 % (ref 11.5–15.5)
WBC: 3.6 10*3/uL — ABNORMAL LOW (ref 4.0–10.5)
nRBC: 0 % (ref 0.0–0.2)

## 2022-04-30 LAB — HEPATIC FUNCTION PANEL
ALT: 10 U/L (ref 0–44)
AST: 16 U/L (ref 15–41)
Albumin: 3.7 g/dL (ref 3.5–5.0)
Alkaline Phosphatase: 70 U/L (ref 38–126)
Bilirubin, Direct: 0.1 mg/dL (ref 0.0–0.2)
Indirect Bilirubin: 0.6 mg/dL (ref 0.3–0.9)
Total Bilirubin: 0.7 mg/dL (ref 0.3–1.2)
Total Protein: 6.9 g/dL (ref 6.5–8.1)

## 2022-05-01 LAB — MISC LABCORP TEST (SEND OUT): Labcorp test code: 884247

## 2022-05-04 ENCOUNTER — Other Ambulatory Visit: Payer: Self-pay

## 2022-05-04 MED ORDER — ROSUVASTATIN CALCIUM 20 MG PO TABS: 20.0000 mg | ORAL_TABLET | Freq: Every day | ORAL | 3 refills | Status: DC

## 2022-05-06 ENCOUNTER — Telehealth: Payer: Self-pay | Admitting: Internal Medicine

## 2022-05-06 NOTE — Telephone Encounter (Signed)
Called patient regarding upcoming February appointments, patient is notified.

## 2022-05-10 NOTE — Progress Notes (Unsigned)
Synopsis: Referred for abnormal PET/CT by Heilingoetter, Cassandr*  Subjective:   PATIENT ID: Claudia [name] GENDER: female DOB: Mar 19, 1946, MRN: [id number]  Chief Complaint  Patient presents with   Pulmonary Consult    Pulmonary nodule. She denies any respiratory co's.    76yF with history of COPD, limited stage small cell lung cancer dx 2011 sp chemoXRT c/b recurrence 2015 s/p RUL wedge resection with Dr. Cyndia Bent f/b chemotherapy, PCE, achalasia, GERD, HTN, referred with concern for disease recurrence LUL  She has no CP, cough, DOE. No unintentional weight loss. THough she doesn't report overt dysphagia she does regurgitate food, spit food up after eating.   Otherwise pertinent review of systems is negative.    Past Medical History:  Diagnosis Date   Anemia    Arthritis    Bradycardia    mild,may be due to beta blocker therapy   COPD (chronic obstructive pulmonary disease) (HCC)    GERD (gastroesophageal reflux disease)    otc   Hypertension 06/01/2019   pt denies   Local recurrence of lung cancer ([place]) dx'd 07/2013   rt thoracotomy chemo comp 12/2013   Lung cancer ([place]) 03/30/2008   Dr. Julien Nordmann, finished chemo, sp radiation, left upper    Lymphocytic colitis    Pneumonia      Family History  Problem Relation Age of Onset   Brain cancer Mother        brain cancer   Emphysema Father    Diabetes Son    Heart disease Paternal Grandfather    Breast cancer Maternal Aunt      Past Surgical History:  Procedure Laterality Date   BRONCHOSCOPY  2011   DILATION AND CURETTAGE OF UTERUS     ESOPHAGOGASTRODUODENOSCOPY (EGD) WITH PROPOFOL N/A 07/29/2021   Procedure: ESOPHAGOGASTRODUODENOSCOPY (EGD) WITH PROPOFOL;  Surgeon: Harvel Quale, MD;  Location: [facility];  Service: Gastroenterology;  Laterality: N/A;  1235 ASA 2   NECK SURGERY  1980's   THORACOTOMY Right 09/21/2013   Procedure: THORACOTOMY MAJOR;  Surgeon: Gaye Pollack, MD;  Location: [facility];   Service: Thoracic;  Laterality: Right;   UTERINE FIBROID SURGERY  2012   WEDGE RESECTION Right 09/21/2013   Procedure: RIGHT UPPER LOBE WEDGE RESECTION;  Surgeon: Gaye Pollack, MD;  Location: [facility];  Service: Thoracic;  Laterality: Right;    Social History   Socioeconomic History   Marital status: Married    Spouse name: Not on file   Number of children: Not on file   Years of education: Not on file   Highest education level: Not on file  Occupational History   Occupation: clerical    Employer: [employer]  Tobacco Use   Smoking status: Former    Packs/day: 1.00    Years: 35.00    Total pack years: 35.00    Types: Cigarettes    Quit date: 03/31/2007    Years since quitting: 15.1    Passive exposure: Past   Smokeless tobacco: Never  Vaping Use   Vaping Use: Never used  Substance and Sexual Activity   Alcohol use: Yes    Comment: occassional about 3 drinks per year   Drug use: No   Sexual activity: Never  Other Topics Concern   Not on file  Social History Narrative   Not on file   Social Determinants of Health   Financial Resource Strain: Not on file  Food Insecurity: Not on file  Transportation Needs: Not on file  Physical  Activity: Not on file  Stress: Not on file  Social Connections: Not on file  Intimate Partner Violence: Not on file     Allergies  Allergen Reactions   Azithromycin Shortness Of Breath and Rash    Took Tussionex at same time Jan 2013.   Tussionex Pennkinetic Er [Hydrocod Poli-Chlorphe Poli Er] Shortness Of Breath and Rash    Took Azithromycin at same time Jan 2013   Omeprazole Rash     Outpatient Medications Prior to Visit  Medication Sig Dispense Refill   Calcium Carbonate Antacid (TUMS PO) Take 500-1,000 mg by mouth 3 (three) times daily as needed (indigestion/heartburn.).     Cholecalciferol (VITAMIN D-3) 25 MCG (1000 UT) CAPS Take 1,000 Units by mouth daily.     Coenzyme Q10 100 MG TABS Take 100 mg by mouth daily.     ferrous  sulfate 325 (65 FE) MG tablet Take 1 tablet (325 mg total) by mouth daily. 30 tablet 3   Multiple Vitamin (MULTIVITAMIN) capsule Take 1 capsule by mouth daily.     Probiotic Product (PROBIOTIC DAILY PO) Take 1 capsule by mouth daily.     rosuvastatin (CRESTOR) 20 MG tablet Take 1 tablet (20 mg total) by mouth daily. 90 tablet 3   tretinoin (RETIN-A) 0.1 % cream Apply 1 application  topically at bedtime.     Turmeric Curcumin 500 MG CAPS Take 500 mg by mouth daily.     dronedarone (MULTAQ) 400 MG tablet Take 1 tablet (400 mg total) by mouth 2 (two) times daily with a meal. (Patient not taking: Reported on 02/27/2022) 60 tablet 3   pantoprazole (PROTONIX) 40 MG tablet Take 1 tablet (40 mg total) by mouth daily. (Patient not taking: Reported on 11/13/2021) 30 tablet 0   No facility-administered medications prior to visit.       Objective:   Physical Exam:  General appearance: 77 y.o., female, NAD, conversant  Eyes: anicteric sclerae; PERRL, tracking appropriately HENT: NCAT; MMM Neck: Trachea midline; no lymphadenopathy, no JVD Lungs: diminished bilaterally, with normal respiratory effort CV: RRR, no murmur  Abdomen: Soft, non-tender; non-distended, BS present  Extremities: No peripheral edema, warm Skin: Normal turgor and texture; no rash Psych: Appropriate affect Neuro: Alert and oriented to person and place, no focal deficit     Vitals:   05/12/22 1031  BP: 118/64  Pulse: 98  Temp: 98.3 F (36.8 C)  TempSrc: Oral  SpO2: 98%  Weight: 100 lb 3.2 oz (45.5 kg)  Height: 5\' 2"  (1.575 m)   98% on RA BMI Readings from Last 3 Encounters:  05/12/22 18.33 kg/m  04/09/22 19.22 kg/m  02/27/22 19.20 kg/m   Wt Readings from Last 3 Encounters:  05/12/22 100 lb 3.2 oz (45.5 kg)  04/09/22 101 lb 11.2 oz (46.1 kg)  02/27/22 101 lb 9.6 oz (46.1 kg)     CBC    Component Value Date/Time   WBC 3.6 (L) 04/30/2022 1051   RBC 3.57 (L) 04/30/2022 1051   HGB 11.0 (L) 04/30/2022  1051   HGB 11.2 (L) 04/23/2022 1355   HGB 11.9 11/02/2016 0919   HCT 33.8 (L) 04/30/2022 1051   HCT 36.6 11/02/2016 0919   PLT 134 (L) 04/30/2022 1051   PLT 133 (L) 04/23/2022 1355   PLT 144 (L) 11/02/2016 0919   MCV 94.7 04/30/2022 1051   MCV 93.8 11/02/2016 0919   MCH 30.8 04/30/2022 1051   MCHC 32.5 04/30/2022 1051   RDW 14.9 04/30/2022 1051   RDW 14.4  11/02/2016 0919   LYMPHSABS 0.6 (L) 04/23/2022 1355   LYMPHSABS 0.6 (L) 11/02/2016 0919   MONOABS 0.6 04/23/2022 1355   MONOABS 0.8 11/02/2016 0919   EOSABS 0.0 04/23/2022 1355   EOSABS 0.1 11/02/2016 0919   BASOSABS 0.0 04/23/2022 1355   BASOSABS 0.0 11/02/2016 0919     Chest Imaging: PET/CT 04/24/22 with multiple hypermetabolic pulmonary nodules - LUL 78mm, LLL 60mm, and large hypermetabolic mass-like consolidation lingula  Pulmonary Functions Testing Results:    Latest Ref Rng & Units 09/14/2013    8:08 AM  PFT Results  FVC-Pre L 2.05   FVC-Predicted Pre % 72   FVC-Post L 2.20   FVC-Predicted Post % 77   Pre FEV1/FVC % % 55   Post FEV1/FCV % % 59   FEV1-Pre L 1.14   FEV1-Predicted Pre % 52   FEV1-Post L 1.29   DLCO uncorrected ml/min/mmHg 11.11   DLCO UNC% % 51   DLCO corrected ml/min/mmHg 11.11   DLCO COR %Predicted % 51   DLVA Predicted % 85   TLC L 4.04   TLC % Predicted % 85   RV % Predicted % 95    Moderate obstruction, moderately reduced diffusing capacity    Echocardiogram 11/2021:    1. Left ventricular ejection fraction, by estimation, is 60 to 65%. The  left ventricle has normal function. There is mild concentric left  ventricular hypertrophy.   2. Right ventricular systolic function is normal.   3. A small pericardial effusion is present. The pericardial effusion is  anterior to the right ventricle. There is no evidence of cardiac  tamponade.        Assessment & Plan:   # LUL hypermetabolic mass # LLL hypermetabolic nodule # History of limited stage small cell lung cancer dx 2011 sp  chemoXRT c/b recurrence 2015 s/p RUL wedge resection with Dr. Cyndia Bent f/b chemotherapy  # Pericardial effusion without tamponade  # COPD Gold A  Plan: - we will plan navigation bronchoscopy under general anesthesia. Reviewed risks respiratory failure <5%, pneumothorax requiring chest tube 1%, major bleeding 03/998. Agrees to proceed.  - clinic visit in 3 weeks to review results tentatively     Maryjane Hurter, MD [facility] 05/12/2022 10:54 AM

## 2022-05-10 NOTE — H&P (View-Only) (Signed)
Synopsis: Referred for abnormal PET/CT by Heilingoetter, Cassandr*  Subjective:   PATIENT ID: Claudia [name] GENDER: female DOB: Mar 19, 1946, MRN: [id number]  Chief Complaint  Patient presents with   Pulmonary Consult    Pulmonary nodule. She denies any respiratory co's.    76yF with history of COPD, limited stage small cell lung cancer dx 2011 sp chemoXRT c/b recurrence 2015 s/p RUL wedge resection with Dr. Cyndia Bent f/b chemotherapy, PCE, achalasia, GERD, HTN, referred with concern for disease recurrence LUL  She has no CP, cough, DOE. No unintentional weight loss. THough she doesn't report overt dysphagia she does regurgitate food, spit food up after eating.   Otherwise pertinent review of systems is negative.    Past Medical History:  Diagnosis Date   Anemia    Arthritis    Bradycardia    mild,may be due to beta blocker therapy   COPD (chronic obstructive pulmonary disease) (HCC)    GERD (gastroesophageal reflux disease)    otc   Hypertension 06/01/2019   pt denies   Local recurrence of lung cancer ([place]) dx'd 07/2013   rt thoracotomy chemo comp 12/2013   Lung cancer ([place]) 03/30/2008   Dr. Julien Nordmann, finished chemo, sp radiation, left upper    Lymphocytic colitis    Pneumonia      Family History  Problem Relation Age of Onset   Brain cancer Mother        brain cancer   Emphysema Father    Diabetes Son    Heart disease Paternal Grandfather    Breast cancer Maternal Aunt      Past Surgical History:  Procedure Laterality Date   BRONCHOSCOPY  2011   DILATION AND CURETTAGE OF UTERUS     ESOPHAGOGASTRODUODENOSCOPY (EGD) WITH PROPOFOL N/A 07/29/2021   Procedure: ESOPHAGOGASTRODUODENOSCOPY (EGD) WITH PROPOFOL;  Surgeon: Harvel Quale, MD;  [facility];  Service: Gastroenterology;  Laterality: N/A;  1235 ASA 2   NECK SURGERY  1980's   THORACOTOMY Right 09/21/2013   Procedure: THORACOTOMY MAJOR;  Surgeon: Gaye Pollack, MD;  Location: [facility];   Service: Thoracic;  Laterality: Right;   UTERINE FIBROID SURGERY  2012   WEDGE RESECTION Right 09/21/2013   Procedure: RIGHT UPPER LOBE WEDGE RESECTION;  Surgeon: Gaye Pollack, MD;  Location: [facility];  Service: Thoracic;  Laterality: Right;    Social History   Socioeconomic History   Marital status: Married    Spouse name: Not on file   Number of children: Not on file   Years of education: Not on file   Highest education level: Not on file  Occupational History   Occupation: clerical    Employer: [employer]  Tobacco Use   Smoking status: Former    Packs/day: 1.00    Years: 35.00    Total pack years: 35.00    Types: Cigarettes    Quit date: 03/31/2007    Years since quitting: 15.1    Passive exposure: Past   Smokeless tobacco: Never  Vaping Use   Vaping Use: Never used  Substance and Sexual Activity   Alcohol use: Yes    Comment: occassional about 3 drinks per year   Drug use: No   Sexual activity: Never  Other Topics Concern   Not on file  Social History Narrative   Not on file   Social Determinants of Health   Financial Resource Strain: Not on file  Food Insecurity: Not on file  Transportation Needs: Not on file  Physical  Activity: Not on file  Stress: Not on file  Social Connections: Not on file  Intimate Partner Violence: Not on file     Allergies  Allergen Reactions   Azithromycin Shortness Of Breath and Rash    Took Tussionex at same time Jan 2013.   Tussionex Pennkinetic Er [Hydrocod Poli-Chlorphe Poli Er] Shortness Of Breath and Rash    Took Azithromycin at same time Jan 2013   Omeprazole Rash     Outpatient Medications Prior to Visit  Medication Sig Dispense Refill   Calcium Carbonate Antacid (TUMS PO) Take 500-1,000 mg by mouth 3 (three) times daily as needed (indigestion/heartburn.).     Cholecalciferol (VITAMIN D-3) 25 MCG (1000 UT) CAPS Take 1,000 Units by mouth daily.     Coenzyme Q10 100 MG TABS Take 100 mg by mouth daily.     ferrous  sulfate 325 (65 FE) MG tablet Take 1 tablet (325 mg total) by mouth daily. 30 tablet 3   Multiple Vitamin (MULTIVITAMIN) capsule Take 1 capsule by mouth daily.     Probiotic Product (PROBIOTIC DAILY PO) Take 1 capsule by mouth daily.     rosuvastatin (CRESTOR) 20 MG tablet Take 1 tablet (20 mg total) by mouth daily. 90 tablet 3   tretinoin (RETIN-A) 0.1 % cream Apply 1 application  topically at bedtime.     Turmeric Curcumin 500 MG CAPS Take 500 mg by mouth daily.     dronedarone (MULTAQ) 400 MG tablet Take 1 tablet (400 mg total) by mouth 2 (two) times daily with a meal. (Patient not taking: Reported on 02/27/2022) 60 tablet 3   pantoprazole (PROTONIX) 40 MG tablet Take 1 tablet (40 mg total) by mouth daily. (Patient not taking: Reported on 11/13/2021) 30 tablet 0   No facility-administered medications prior to visit.       Objective:   Physical Exam:  General appearance: 77 y.o., female, NAD, conversant  Eyes: anicteric sclerae; PERRL, tracking appropriately HENT: NCAT; MMM Neck: Trachea midline; no lymphadenopathy, no JVD Lungs: diminished bilaterally, with normal respiratory effort CV: RRR, no murmur  Abdomen: Soft, non-tender; non-distended, BS present  Extremities: No peripheral edema, warm Skin: Normal turgor and texture; no rash Psych: Appropriate affect Neuro: Alert and oriented to person and place, no focal deficit     Vitals:   05/12/22 1031  BP: 118/64  Pulse: 98  Temp: 98.3 F (36.8 C)  TempSrc: Oral  SpO2: 98%  Weight: 100 lb 3.2 oz (45.5 kg)  Height: 5\' 2"  (1.575 m)   98% on RA BMI Readings from Last 3 Encounters:  05/12/22 18.33 kg/m  04/09/22 19.22 kg/m  02/27/22 19.20 kg/m   Wt Readings from Last 3 Encounters:  05/12/22 100 lb 3.2 oz (45.5 kg)  04/09/22 101 lb 11.2 oz (46.1 kg)  02/27/22 101 lb 9.6 oz (46.1 kg)     CBC    Component Value Date/Time   WBC 3.6 (L) 04/30/2022 1051   RBC 3.57 (L) 04/30/2022 1051   HGB 11.0 (L) 04/30/2022  1051   HGB 11.2 (L) 04/23/2022 1355   HGB 11.9 11/02/2016 0919   HCT 33.8 (L) 04/30/2022 1051   HCT 36.6 11/02/2016 0919   PLT 134 (L) 04/30/2022 1051   PLT 133 (L) 04/23/2022 1355   PLT 144 (L) 11/02/2016 0919   MCV 94.7 04/30/2022 1051   MCV 93.8 11/02/2016 0919   MCH 30.8 04/30/2022 1051   MCHC 32.5 04/30/2022 1051   RDW 14.9 04/30/2022 1051   RDW 14.4  11/02/2016 0919   LYMPHSABS 0.6 (L) 04/23/2022 1355   LYMPHSABS 0.6 (L) 11/02/2016 0919   MONOABS 0.6 04/23/2022 1355   MONOABS 0.8 11/02/2016 0919   EOSABS 0.0 04/23/2022 1355   EOSABS 0.1 11/02/2016 0919   BASOSABS 0.0 04/23/2022 1355   BASOSABS 0.0 11/02/2016 0919     Chest Imaging: PET/CT 04/24/22 with multiple hypermetabolic pulmonary nodules - LUL 78mm, LLL 60mm, and large hypermetabolic mass-like consolidation lingula  Pulmonary Functions Testing Results:    Latest Ref Rng & Units 09/14/2013    8:08 AM  PFT Results  FVC-Pre L 2.05   FVC-Predicted Pre % 72   FVC-Post L 2.20   FVC-Predicted Post % 77   Pre FEV1/FVC % % 55   Post FEV1/FCV % % 59   FEV1-Pre L 1.14   FEV1-Predicted Pre % 52   FEV1-Post L 1.29   DLCO uncorrected ml/min/mmHg 11.11   DLCO UNC% % 51   DLCO corrected ml/min/mmHg 11.11   DLCO COR %Predicted % 51   DLVA Predicted % 85   TLC L 4.04   TLC % Predicted % 85   RV % Predicted % 95    Moderate obstruction, moderately reduced diffusing capacity    Echocardiogram 11/2021:    1. Left ventricular ejection fraction, by estimation, is 60 to 65%. The  left ventricle has normal function. There is mild concentric left  ventricular hypertrophy.   2. Right ventricular systolic function is normal.   3. A small pericardial effusion is present. The pericardial effusion is  anterior to the right ventricle. There is no evidence of cardiac  tamponade.        Assessment & Plan:   # LUL hypermetabolic mass # LLL hypermetabolic nodule # History of limited stage small cell lung cancer dx 2011 sp  chemoXRT c/b recurrence 2015 s/p RUL wedge resection with Dr. Cyndia Bent f/b chemotherapy  # Pericardial effusion without tamponade  # COPD Gold A  Plan: - we will plan navigation bronchoscopy under general anesthesia. Reviewed risks respiratory failure <5%, pneumothorax requiring chest tube 1%, major bleeding 03/998. Agrees to proceed.  - clinic visit in 3 weeks to review results tentatively     Maryjane Hurter, MD [facility] 05/12/2022 10:54 AM

## 2022-05-12 ENCOUNTER — Telehealth: Payer: Self-pay | Admitting: Student

## 2022-05-12 ENCOUNTER — Ambulatory Visit: Payer: Medicare PPO | Admitting: Student

## 2022-05-12 ENCOUNTER — Encounter: Payer: Self-pay | Admitting: Student

## 2022-05-12 VITALS — BP 118/64 | HR 98 | Temp 98.3°F | Ht 62.0 in | Wt 100.2 lb

## 2022-05-12 DIAGNOSIS — J449 Chronic obstructive pulmonary disease, unspecified: Secondary | ICD-10-CM

## 2022-05-12 DIAGNOSIS — R918 Other nonspecific abnormal finding of lung field: Secondary | ICD-10-CM

## 2022-05-12 NOTE — Patient Instructions (Addendum)
-   We will set up robotic bronchoscopy in the next week or two - Nothing to eat or drink after midnight the night before the procedure. Need ride home on the day of the procedure.  - procedure will be at [facility] on 2nd floor in endoscopy - tentatively will set up clinic follow up in 3 weeks to review results

## 2022-05-12 NOTE — Telephone Encounter (Signed)
Pt has been scheduled and info in referral.

## 2022-05-18 ENCOUNTER — Encounter (INDEPENDENT_AMBULATORY_CARE_PROVIDER_SITE_OTHER): Payer: Self-pay | Admitting: *Deleted

## 2022-05-18 ENCOUNTER — Encounter (INDEPENDENT_AMBULATORY_CARE_PROVIDER_SITE_OTHER): Payer: Self-pay | Admitting: Gastroenterology

## 2022-05-18 ENCOUNTER — Ambulatory Visit (INDEPENDENT_AMBULATORY_CARE_PROVIDER_SITE_OTHER): Payer: Medicare PPO | Admitting: Gastroenterology

## 2022-05-18 ENCOUNTER — Other Ambulatory Visit (INDEPENDENT_AMBULATORY_CARE_PROVIDER_SITE_OTHER): Payer: Self-pay | Admitting: *Deleted

## 2022-05-18 VITALS — BP 137/79 | HR 80 | Temp 97.9°F | Ht 62.0 in | Wt 100.7 lb

## 2022-05-18 DIAGNOSIS — K220 Achalasia of cardia: Secondary | ICD-10-CM

## 2022-05-18 DIAGNOSIS — K3184 Gastroparesis: Secondary | ICD-10-CM | POA: Diagnosis not present

## 2022-05-18 DIAGNOSIS — E440 Moderate protein-calorie malnutrition: Secondary | ICD-10-CM | POA: Diagnosis not present

## 2022-05-18 DIAGNOSIS — R9389 Abnormal findings on diagnostic imaging of other specified body structures: Secondary | ICD-10-CM | POA: Diagnosis not present

## 2022-05-18 DIAGNOSIS — K224 Dyskinesia of esophagus: Secondary | ICD-10-CM | POA: Diagnosis not present

## 2022-05-18 MED ORDER — METOCLOPRAMIDE HCL 5 MG PO TABS: 5.0000 mg | ORAL_TABLET | Freq: Three times a day (TID) | ORAL | 3 refills | Status: AC

## 2022-05-18 NOTE — Patient Instructions (Signed)
Start Reglan 5 mg TID Referral for esophageal manometry Timed barium esophagram - achalasia protocol Follow up with oncology and pulmonology, proceed with bronchoscopy Attempt to eat small portions multiple times per day, chew food thoroughly For meat, eat ground beef, pork, chicken or [place]

## 2022-05-18 NOTE — Progress Notes (Addendum)
Claudia [name], M.D. Gastroenterology & Hepatology [facility] [address] [place], [place] [address]  Primary Care Physician: [name], MD [place] [address]  I will communicate my assessment and recommendations to the referring MD via EMR.  Problems: Severe gastroparesis Possible achalasia Recurrent hiccups GERD Lymphocytic colitis, in remission Iron deficiency anemia Moderate protein-caloric malnutrition  History of Present Illness: Claudia [name] is a 77 y.o. female with PMH recurrent small cell lung cancer status post chemotherapy with carboplatin and etoposide, status post radiation therapy and prophylactic cranial radiation, recurrent malignancy was treated with wedge resection, possible lymphocytic colitis, COPD, GERD, severe gastroparesis, possible achalasia, who presents for follow up of weight loss, severe gastroparesis and achalasia.  The patient was last seen on 11/13/2021. At that time, the patient was advised to restart omeprazole 40 mg every day.  She was advised to take multiple small meals during the day but she declined taking and Reglan.  The patient reports she has been eating less than usual as she feels she gets full easily and states the food "will start coming up shortly after eating the meal". She states she "feels the food will start coming up on its own and has to spit it". Sometimes she feels nausea and vomits liquid contents occasionally. She denies having any dysphagia or chest pain. Very occasionally has heartburn but she still has some burping.  She has presented recurrent episodes of spitting episodes for the last few weeks, even though she is not eating.   She does not have a BM every day, but more recently she has found to have more  infrequent Bms - possibly every other day with some straining.  The patient denies having any fever, chills, hematochezia, melena, hematemesis, abdominal  distention, abdominal pain, diarrhea, jaundice, pruritus.  The patient has lost 8 pounds since the last time she was seen in clinic.  Most recent blood workup from 04/30/2022 showed CBC with hemoglobin of 11.0, WBC 3.6, platelets 134, normal hepatic panel.  As part of the surveillance of her SCLC, the patient had PET scan on 04/23/2022 that showed presence of extensive recurrent tumor in the left lung, as well as multiple hypermetabolic nodules consistent with metastatic disease.  There was also marked elevation of the esophagus concerning for achalasia.  She has been evaluated by oncology and pulmonology, has plan for possible bronchoscopy to evaluate further the possibility of recurrent small cell lung cancer.  Last Colonoscopy:Last Colonoscopy:2013 - sigmoid diverticulosis, patchy loss of vascular marking, biopsies performed but no results available Last Endoscopy:5/2/23Fluid in the esophagus. Fluid aspiration performed. - A large amount of food (residue) in the stomach. - Normal examined duodenum GES:08/13/21: severe delay in gastric emptying, recommend eat small meals throughout the day  Past Medical History: Past Medical History:  Diagnosis Date   Anemia    Arthritis    Bradycardia    mild,may be due to beta blocker therapy   COPD (chronic obstructive pulmonary disease) (HCC)    GERD (gastroesophageal reflux disease)    otc   Hypertension 06/01/2019   pt denies   Local recurrence of lung cancer ([place]) dx'd 07/2013   rt thoracotomy chemo comp 12/2013   Lung cancer ([place]) 03/30/2008   Dr. Julien Nordmann, finished chemo, sp radiation, left upper    Lymphocytic colitis    Pneumonia     Past Surgical History: Past Surgical History:  Procedure Laterality Date   BRONCHOSCOPY  2011   DILATION AND  CURETTAGE OF UTERUS     ESOPHAGOGASTRODUODENOSCOPY (EGD) WITH PROPOFOL N/A 07/29/2021   Procedure: ESOPHAGOGASTRODUODENOSCOPY (EGD) WITH PROPOFOL;  Surgeon: Harvel Quale, MD;  Location: [facility]  ENDO SUITE;  Service: Gastroenterology;  Laterality: N/A;  1235 ASA 2   NECK SURGERY  1980's   THORACOTOMY Right 09/21/2013   Procedure: THORACOTOMY MAJOR;  Surgeon: Gaye Pollack, MD;  Location: [facility] OR;  Service: Thoracic;  Laterality: Right;   UTERINE FIBROID SURGERY  2012   WEDGE RESECTION Right 09/21/2013   Procedure: RIGHT UPPER LOBE WEDGE RESECTION;  Surgeon: Gaye Pollack, MD;  Location: [facility] OR;  Service: Thoracic;  Laterality: Right;    Family History: Family History  Problem Relation Age of Onset   Brain cancer Mother        brain cancer   Emphysema Father    Diabetes Son    Heart disease Paternal Grandfather    Breast cancer Maternal Aunt     Social History: Social History   Tobacco Use  Smoking Status Former   Packs/day: 1.00   Years: 35.00   Total pack years: 35.00   Types: Cigarettes   Quit date: 03/31/2007   Years since quitting: 15.1   Passive exposure: Past  Smokeless Tobacco Never   Social History   Substance and Sexual Activity  Alcohol Use Yes   Comment: occassional about 3 drinks per year   Social History   Substance and Sexual Activity  Drug Use No    Allergies: Allergies  Allergen Reactions   Azithromycin Shortness Of Breath and Rash    Took Tussionex at same time Jan 2013.   Tussionex Pennkinetic Er [Hydrocod Poli-Chlorphe Poli Er] Shortness Of Breath and Rash    Took Azithromycin at same time Jan 2013   Omeprazole Rash    Medications: Current Outpatient Medications  Medication Sig Dispense Refill   Calcium Carbonate Antacid (TUMS PO) Take 500-1,000 mg by mouth 3 (three) times daily as needed (indigestion/heartburn.).     Cholecalciferol (VITAMIN D-3) 25 MCG (1000 UT) CAPS Take 1,000 Units by mouth daily.     Coenzyme Q10 100 MG TABS Take 100 mg by mouth daily.     Multiple Vitamin (MULTIVITAMIN) capsule Take 1 capsule by mouth daily.     Probiotic Product (PROBIOTIC DAILY PO) Take 1 capsule by mouth daily.     rosuvastatin (CRESTOR)  20 MG tablet Take 1 tablet (20 mg total) by mouth daily. 90 tablet 3   tretinoin (RETIN-A) 0.1 % cream Apply 1 application  topically at bedtime.     Turmeric Curcumin 500 MG CAPS Take 500 mg by mouth daily.     ferrous sulfate 325 (65 FE) MG tablet Take 1 tablet (325 mg total) by mouth daily. (Patient not taking: Reported on 05/18/2022) 30 tablet 3   No current facility-administered medications for this visit.    Review of Systems: GENERAL: negative for malaise, night sweats HEENT: No changes in hearing or vision, no nose bleeds or other nasal problems. NECK: Negative for lumps, goiter, pain and significant neck swelling RESPIRATORY: Negative for cough, wheezing CARDIOVASCULAR: Negative for chest pain, leg swelling, palpitations, orthopnea GI: SEE HPI MUSCULOSKELETAL: Negative for joint pain or swelling, back pain, and muscle pain. SKIN: Negative for lesions, rash PSYCH: Negative for sleep disturbance, mood disorder and recent psychosocial stressors. HEMATOLOGY Negative for prolonged bleeding, bruising easily, and swollen nodes. ENDOCRINE: Negative for cold or heat intolerance, polyuria, polydipsia and goiter. NEURO: negative for tremor, gait imbalance, syncope and seizures. The  remainder of the review of systems is noncontributory.   Physical Exam: BP 137/79 (BP Location: Left Arm, Patient Position: Sitting, Cuff Size: Normal)   Pulse 80   Temp 97.9 F (36.6 C) (Oral)   Ht 5\' 2"  (1.575 m)   Wt 100 lb 11.2 oz (45.7 kg)   BMI 18.42 kg/m  GENERAL: The patient is AO x3, in no acute distress. HEENT: Head is normocephalic and atraumatic. EOMI are intact. Mouth is well hydrated and without lesions. NECK: Supple. No masses LUNGS: Clear to auscultation. No presence of rhonchi/wheezing/rales. Adequate chest expansion HEART: RRR, normal s1 and s2. ABDOMEN: Soft, nontender, no guarding, no peritoneal signs, and nondistended. BS +. No masses. EXTREMITIES: Without any cyanosis, clubbing,  rash, lesions or edema. NEUROLOGIC: AOx3, no focal motor deficit. SKIN: no jaundice, no rashes  Imaging/Labs: as above  I personally reviewed and interpreted the available labs, imaging and endoscopic files.  Impression and Plan: GLADIS SOLEY is a 77 y.o. female with PMH recurrent small cell lung cancer status post chemotherapy with carboplatin and etoposide, status post radiation therapy and prophylactic cranial radiation, recurrent malignancy was treated with wedge resection, possible lymphocytic colitis, COPD, GERD, severe gastroparesis, possible achalasia, who presents for follow up of weight loss, severe gastroparesis and achalasia.  The patient has presented recurrent and progressive episodes of regurgitation and poor oral intake due to early satiety.  Her most recent endoscopic investigations showed presence of large amount of fluid in her esophagus and large amount of food in her stomach which raises the concern for severe gastroparesis, which was subsequently confirmed with a gastric emptying study.  She has not been able to keep an adequate oral intake despite eating small portions during the day and has been losing significant amount of weight.  She is currently underweight and presenting some protein caloric malnutrition.  Given the esophageal findings, I was initially concerned for possible achalasia although she was not presenting any dysphagia or food impaction episodes.  However, her most recent PET scan showed dilation of her esophagus with fluid contents.  I consider  that she is presenting generalized dysmotility in her esophagus, stomach and possibly her small /large bowel as she is now presenting constipation.  My main concern is that this is related to a possible paraneoplastic syndrome in the setting of possible recurrent small cell lung cancer (to be confirmed).  This entity is known to cause pseudo achalasia and could potentially lead to abnormalities in motility.  If this is  the case, the treatment may be difficult as the main goal will be to treat the underlying etiology, in this case the possible recurrent lung cancer.  I will discuss with her pulmonologist and oncologist to make them aware of this and notify me about the results of her upcoming biopsy.  In the meantime, regarding her gastroparesis we discussed with the patient and her husband the possibility of starting low-dose Reglan 3 times daily.  The benefits and potential risks with the medication were thoroughly discussed and they are agreeable to proceed with this medication.  If no improvement, we can consider increasing the dose to 10 mg 3 times daily or switching to domperidone.  She will try to eat multiple small portions during the day.  In regards to the concern for possible achalasia/pseudo achalasia, we discussed the need to confirm this diagnosis with 2 diagnostic modalities.  First, we will proceed with a timed barium esophagram, but we will also need to send a referral for an  esophageal manometry.  Depending on findings we we will discuss the potential treatment modalities which may include possible Heller myotomy versus POEM at [facility]; if these are not possible options or if the patient is deemed to be a poor candidate for these procedures, can consider Botox injection.  Regarding her pancreatic cysts, will need to have an MRCP next year.  - Start Reglan 5 mg TID - Referral for esophageal manometry - Timed barium esophagram - achalasia protocol - Follow up with oncology and pulmonology, proceed with bronchoscopy - Attempt to eat small portions multiple times per day, chew food thoroughly - If eating meat, eat ground beef, pork, chicken or [place] - Repeat MRCP 09/2023  All questions were answered.      Claudia Peppers, MD Gastroenterology and Hepatology [facility]

## 2022-05-19 DIAGNOSIS — L578 Other skin changes due to chronic exposure to nonionizing radiation: Secondary | ICD-10-CM | POA: Diagnosis not present

## 2022-05-19 DIAGNOSIS — D225 Melanocytic nevi of trunk: Secondary | ICD-10-CM | POA: Diagnosis not present

## 2022-05-19 DIAGNOSIS — Z411 Encounter for cosmetic surgery: Secondary | ICD-10-CM | POA: Diagnosis not present

## 2022-05-19 DIAGNOSIS — L821 Other seborrheic keratosis: Secondary | ICD-10-CM | POA: Diagnosis not present

## 2022-05-19 DIAGNOSIS — L570 Actinic keratosis: Secondary | ICD-10-CM | POA: Diagnosis not present

## 2022-05-19 DIAGNOSIS — L814 Other melanin hyperpigmentation: Secondary | ICD-10-CM | POA: Diagnosis not present

## 2022-05-19 DIAGNOSIS — Z85828 Personal history of other malignant neoplasm of skin: Secondary | ICD-10-CM | POA: Diagnosis not present

## 2022-05-21 ENCOUNTER — Other Ambulatory Visit (INDEPENDENT_AMBULATORY_CARE_PROVIDER_SITE_OTHER): Payer: Self-pay | Admitting: Gastroenterology

## 2022-05-21 ENCOUNTER — Ambulatory Visit (HOSPITAL_COMMUNITY)
Admission: RE | Admit: 2022-05-21 | Discharge: 2022-05-21 | Disposition: A | Discharge status: Home / Self Care | Payer: Medicare PPO | Source: Ambulatory Visit | Attending: Gastroenterology | Admitting: Gastroenterology

## 2022-05-21 DIAGNOSIS — K220 Achalasia of cardia: Secondary | ICD-10-CM | POA: Insufficient documentation

## 2022-05-21 DIAGNOSIS — R6339 Other feeding difficulties: Secondary | ICD-10-CM | POA: Diagnosis not present

## 2022-05-25 ENCOUNTER — Other Ambulatory Visit: Payer: Medicare PPO

## 2022-05-25 ENCOUNTER — Ambulatory Visit (HOSPITAL_COMMUNITY)
Admission: RE | Admit: 2022-05-25 | Discharge: 2022-05-25 | Disposition: A | Discharge status: Home / Self Care | Payer: Medicare PPO | Source: Ambulatory Visit | Attending: Student | Admitting: Student

## 2022-05-25 ENCOUNTER — Inpatient Hospital Stay: Payer: Medicare PPO | Attending: Nurse Practitioner

## 2022-05-25 ENCOUNTER — Ambulatory Visit: Payer: Medicare PPO | Admitting: Physician Assistant

## 2022-05-25 ENCOUNTER — Other Ambulatory Visit: Payer: Self-pay | Admitting: *Deleted

## 2022-05-25 DIAGNOSIS — R918 Other nonspecific abnormal finding of lung field: Secondary | ICD-10-CM | POA: Insufficient documentation

## 2022-05-25 DIAGNOSIS — J479 Bronchiectasis, uncomplicated: Secondary | ICD-10-CM | POA: Diagnosis not present

## 2022-05-25 DIAGNOSIS — C3490 Malignant neoplasm of unspecified part of unspecified bronchus or lung: Secondary | ICD-10-CM | POA: Diagnosis not present

## 2022-05-25 DIAGNOSIS — C3411 Malignant neoplasm of upper lobe, right bronchus or lung: Secondary | ICD-10-CM | POA: Diagnosis not present

## 2022-05-25 DIAGNOSIS — Z01812 Encounter for preprocedural laboratory examination: Secondary | ICD-10-CM

## 2022-05-25 DIAGNOSIS — J439 Emphysema, unspecified: Secondary | ICD-10-CM | POA: Diagnosis not present

## 2022-05-25 LAB — CBC WITH DIFFERENTIAL (CANCER CENTER ONLY)
Abs Immature Granulocytes: 0.04 10*3/uL (ref 0.00–0.07)
Basophils Absolute: 0 10*3/uL (ref 0.0–0.1)
Basophils Relative: 1 %
Eosinophils Absolute: 0 10*3/uL (ref 0.0–0.5)
Eosinophils Relative: 1 %
HCT: 33.5 % — ABNORMAL LOW (ref 36.0–46.0)
Hemoglobin: 11.3 g/dL — ABNORMAL LOW (ref 12.0–15.0)
Immature Granulocytes: 1 %
Lymphocytes Relative: 11 %
Lymphs Abs: 0.6 10*3/uL — ABNORMAL LOW (ref 0.7–4.0)
MCH: 31 pg (ref 26.0–34.0)
MCHC: 33.7 g/dL (ref 30.0–36.0)
MCV: 92 fL (ref 80.0–100.0)
Monocytes Absolute: 0.8 10*3/uL (ref 0.1–1.0)
Monocytes Relative: 14 %
Neutro Abs: 4.2 10*3/uL (ref 1.7–7.7)
Neutrophils Relative %: 72 %
Platelet Count: 139 10*3/uL — ABNORMAL LOW (ref 150–400)
RBC: 3.64 MIL/uL — ABNORMAL LOW (ref 3.87–5.11)
RDW: 14.2 % (ref 11.5–15.5)
WBC Count: 5.7 10*3/uL (ref 4.0–10.5)
nRBC: 0 % (ref 0.0–0.2)

## 2022-05-25 LAB — CMP (CANCER CENTER ONLY)
ALT: 6 U/L (ref 0–44)
AST: 13 U/L — ABNORMAL LOW (ref 15–41)
Albumin: 3.8 g/dL (ref 3.5–5.0)
Alkaline Phosphatase: 80 U/L (ref 38–126)
Anion gap: 6 (ref 5–15)
BUN: 11 mg/dL (ref 8–23)
CO2: 31 mmol/L (ref 22–32)
Calcium: 9.7 mg/dL (ref 8.9–10.3)
Chloride: 103 mmol/L (ref 98–111)
Creatinine: 0.75 mg/dL (ref 0.44–1.00)
GFR, Estimated: >60 mL/min (ref >60)
Glucose, Bld: 82 mg/dL (ref 70–99)
Potassium: 3.9 mmol/L (ref 3.5–5.1)
Sodium: 140 mmol/L (ref 135–145)
Total Bilirubin: 0.5 mg/dL (ref 0.3–1.2)
Total Protein: 7.2 g/dL (ref 6.5–8.1)

## 2022-05-27 ENCOUNTER — Encounter (HOSPITAL_COMMUNITY): Payer: Self-pay | Admitting: Student

## 2022-05-27 ENCOUNTER — Other Ambulatory Visit: Payer: Self-pay

## 2022-05-27 LAB — NOVEL CORONAVIRUS, NAA: SARS-CoV-2, NAA: NOT DETECTED

## 2022-05-27 NOTE — Anesthesia Preprocedure Evaluation (Signed)
Anesthesia Evaluation  Patient identified by MRN, date of birth, ID band Patient awake    Reviewed: Allergy & Precautions, H&P , NPO status , Patient's Chart, lab work & pertinent test results, reviewed documented beta blocker date and time   Airway Mallampati: II  TM Distance: >3 FB Neck ROM: full    Dental no notable dental hx.    Pulmonary COPD, former smoker   Pulmonary exam normal breath sounds clear to auscultation       Cardiovascular Exercise Tolerance: Good hypertension, Pt. on medications negative cardio ROS  Rhythm:regular Rate:Normal     Neuro/Psych negative neurological ROS  negative psych ROS   GI/Hepatic Neg liver ROS,GERD  Medicated,,  Endo/Other  negative endocrine ROS    Renal/GU negative Renal ROS  negative genitourinary   Musculoskeletal  (+) Arthritis , Osteoarthritis,    Abdominal   Peds  Hematology  (+) Blood dyscrasia, anemia   Anesthesia Other Findings   Reproductive/Obstetrics negative OB ROS                             Anesthesia Physical Anesthesia Plan  ASA: 3  Anesthesia Plan: General   Post-op Pain Management: Minimal or no pain anticipated   Induction: Intravenous  PONV Risk Score and Plan: 3 and Ondansetron, Dexamethasone, Midazolam and Treatment may vary due to age or medical condition  Airway Management Planned: Oral ETT  Additional Equipment:   Intra-op Plan:   Post-operative Plan: Extubation in OR  Informed Consent: I have reviewed the patients History and Physical, chart, labs and discussed the procedure including the risks, benefits and alternatives for the proposed anesthesia with the patient or authorized representative who has indicated his/her understanding and acceptance.     Dental Advisory Given  Plan Discussed with: CRNA  Anesthesia Plan Comments: (PAT note by Karoline Caldwell, PA-C: 77 yo female with history of lung cancer  (2011, recurrence 2015 and 2024), bradcyardia (felt related to meds), paroxysmal A-fib, smoker with associated COPD (not on any inhaled medications), and GERD.    She was hospitalized in July 2023 for chest pain, atrial fibrillation, hypotension.  She was treated with IV diltiazem and converted to sinus rhythm versus atrial arrhythmia.  Echo at that time showed EF 60 to 65% with moderate pericardial effusion and she was treated with colchicine.  Follow-up echo 12/02/2021 showed EF 6065%, small pericardial effusion, improved from prior.  She had outpatient follow-up with cardiologist Dr. Harrington Challenger on 02/27/2022.  She was in sinus rhythm at that time, event monitor was ordered.  30-day event monitor showed sinus rhythm, rare PAC and PVC, one 6 beat VT, no significant arrhythmias, no atrial fibrillation.  She is not been placed on anticoagulation due to concerns of GI bleeding.  She recently been found to have recurrence of small cell lung cancer.  She initially had limited stage small cell lung cancer in 2011 and recurrence in 2015 s/p right upper lobe wedge resection by Dr. Cyndia Bent 08/2013 and 4 cycles of systemic chemotherapy.  In January 2024 the patient had repeat CT scan of the chest that showed suspicious findings for disease recurrence with collapse of the left upper lobe and scattered small nodules throughout the left lung.  Subsequent CAT scan showed suspicious findings for disease recurrence with hypermetabolism in the left upper lobe as well as hypermetabolic scattered pulmonary nodules consistent with metastatic disease. There is no adenopathy or disease outside the chest.  Oncology recommended biopsy to  confirm small cell recurrence versus secondary to cancer.  Follows with gastroenterology for history of severely delayed gastric emptying.  She is on Reglan for this.  She has been recommended to eat small meals throughout the day.  She also was recently diagnosed with achalasia on barium esophagram.  CMP  and CBC from 05/25/2022 reviewed, mild anemia with hemoglobin 11.3, mild thrombocytopenia with platelets 139k, otherwise unremarkable.  Patient will need day of surgery evaluation.  EKG 02/27/2022: NSR.  Rightward axis.  Pulmonary disease pattern.  Rate 85.  CT super D chest 05/25/2022: IMPRESSION: Overall appearance of the lungs is unchanged from the studies of January 2024.  Bandlike areas of opacity with lung distortion and volume loss on the left side are similar.  Surgical changes in the right lung. Reticulonodular changes in the right lung are similar.  No developing new lymph node enlargement or mass lesion.  Advanced emphysematous lung changes.  Patulous debris-filled esophagus. Please correlate for any known esophageal motility disorder such as achalasia  30-day event monitor 03/10/2022: Rhythm: SInus rhythm Rates 54 to 137 bpm Average HR 84 bpm  Rare PAC, PVC One 6 beat VT.  NO significant arrhythmias   TTE 12/02/2021: 1. Left ventricular ejection fraction, by estimation, is 60 to 65%. The  left ventricle has normal function. There is mild concentric left  ventricular hypertrophy.  2. Right ventricular systolic function is normal.  3. A small pericardial effusion is present. The pericardial effusion is  anterior to the right ventricle. There is no evidence of cardiac  tamponade.   Comparison(s): Compared to 10/28/21 improvement in pericardial effusion; no  fibrinous standing seen in this study.    )        Anesthesia Quick Evaluation

## 2022-05-27 NOTE — Progress Notes (Signed)
Anesthesia Chart Review: Same-day workup  77 yo female with history of lung cancer (2011, recurrence 2015 and 2024), bradcyardia (felt related to meds), paroxysmal A-fib, smoker with associated COPD (not on any inhaled medications), and GERD.    She was hospitalized in July 2023 for chest pain, atrial fibrillation, hypotension.  She was treated with IV diltiazem and converted to sinus rhythm versus atrial arrhythmia.  Echo at that time showed EF 60 to 65% with moderate pericardial effusion and she was treated with colchicine.  Follow-up echo 12/02/2021 showed EF 6065%, small pericardial effusion, improved from prior.  She had outpatient follow-up with cardiologist Dr. Harrington Challenger on 02/27/2022.  She was in sinus rhythm at that time, event monitor was ordered.  30-day event monitor showed sinus rhythm, rare PAC and PVC, one 6 beat VT, no significant arrhythmias, no atrial fibrillation.  She is not been placed on anticoagulation due to concerns of GI bleeding.  She recently been found to have recurrence of small cell lung cancer.  She initially had limited stage small cell lung cancer in 2011 and recurrence in 2015 s/p right upper lobe wedge resection by Dr. Cyndia Bent 08/2013 and 4 cycles of systemic chemotherapy.  In January 2024 the patient had repeat CT scan of the chest that showed suspicious findings for disease recurrence with collapse of the left upper lobe and scattered small nodules throughout the left lung.  Subsequent CAT scan showed suspicious findings for disease recurrence with hypermetabolism in the left upper lobe as well as hypermetabolic scattered pulmonary nodules consistent with metastatic disease. There is no adenopathy or disease outside the chest.  Oncology recommended biopsy to confirm small cell recurrence versus secondary to cancer.  Follows with gastroenterology for history of severely delayed gastric emptying.  She is on Reglan for this.  She has been recommended to eat small meals throughout  the day.  She also was recently diagnosed with achalasia on barium esophagram.  CMP and CBC from 05/25/2022 reviewed, mild anemia with hemoglobin 11.3, mild thrombocytopenia with platelets 139k, otherwise unremarkable.  Patient will need day of surgery evaluation.  EKG 02/27/2022: NSR.  Rightward axis.  Pulmonary disease pattern.  Rate 85.  CT super D chest 05/25/2022: IMPRESSION: Overall appearance of the lungs is unchanged from the studies of January 2024.   Bandlike areas of opacity with lung distortion and volume loss on the left side are similar.   Surgical changes in the right lung. Reticulonodular changes in the right lung are similar.   No developing new lymph node enlargement or mass lesion.   Advanced emphysematous lung changes.   Patulous debris-filled esophagus. Please correlate for any known esophageal motility disorder such as achalasia  30-day event monitor 03/10/2022: Rhythm:  SInus rhythm   Rates 54 to 137 bpm  Average HR 84 bpm    Rare PAC, PVC  One 6 beat VT.     NO significant arrhythmias   TTE 12/02/2021:  1. Left ventricular ejection fraction, by estimation, is 60 to 65%. The  left ventricle has normal function. There is mild concentric left  ventricular hypertrophy.   2. Right ventricular systolic function is normal.   3. A small pericardial effusion is present. The pericardial effusion is  anterior to the right ventricle. There is no evidence of cardiac  tamponade.   Comparison(s): Compared to 10/28/21 improvement in pericardial effusion; no  fibrinous standing seen in this study.       Wynonia Musty [facility] Short Stay Center/Anesthesiology Phone [phone] 05/27/2022  3:46 PM

## 2022-05-27 NOTE — Progress Notes (Signed)
PCP - [name], MD Cardiologist - Fay Records, MD  Chest x-ray - DOS EKG - 02/28/23 ECHO - 09/023  COVID TEST- N 02/26  Anesthesia review: Y  Patient verbally denies any shortness of breath, fever, cough and chest pain during phone call   -------------  SDW INSTRUCTIONS given:  Your procedure is scheduled on 05/25/22.  Report to [facility] Main Entrance "A" at [place].M., and check in at the Admitting office.  Call this number if you have problems the morning of surgery:  [phone]   Remember:  Do not eat or drink after midnight the night before your surgery     Take these medicines the morning of surgery with A SIP OF WATER  Crestor  As of today, STOP taking any Aspirin (unless otherwise instructed by your surgeon) Aleve, Naproxen, Ibuprofen, Motrin, Advil, Goody's, BC's, all herbal medications, fish oil, and all vitamins.                      Do not wear jewelry, make up, or nail polish            Do not wear lotions, powders, perfumes/colognes, or deodorant.            Do not shave 48 hours prior to surgery.  Men may shave face and neck.            Do not bring valuables to the hospital.            [facility] is not responsible for any belongings or valuables.  Do NOT Smoke (Tobacco/Vaping) 24 hours prior to your procedure If you use a CPAP at night, you may bring all equipment for your overnight stay.   Contacts, glasses, dentures or bridgework may not be worn into surgery.      For patients admitted to the hospital, discharge time will be determined by your treatment team.   Patients discharged the day of surgery will not be allowed to drive home, and someone needs to stay with them for 24 hours.    Special instructions:   [facility]- Preparing For Surgery  Before surgery, you can play an important role. Because skin is not sterile, your skin needs to be as free of germs as possible. You can reduce the number of germs on your skin by washing with CHG  (chlorahexidine gluconate) Soap before surgery.  CHG is an antiseptic cleaner which kills germs and bonds with the skin to continue killing germs even after washing.    Oral Hygiene is also important to reduce your risk of infection.  Remember - BRUSH YOUR TEETH THE MORNING OF SURGERY WITH YOUR REGULAR TOOTHPASTE  Please do not use if you have an allergy to CHG or antibacterial soaps. If your skin becomes reddened/irritated stop using the CHG.  Do not shave (including legs and underarms) for at least 48 hours prior to first CHG shower. It is OK to shave your face.  Please follow these instructions carefully.   Shower the NIGHT BEFORE SURGERY and the MORNING OF SURGERY with DIAL Soap.   Pat yourself dry with a CLEAN TOWEL.  Wear CLEAN PAJAMAS to bed the night before surgery  Place CLEAN SHEETS on your bed the night of your first shower and DO NOT SLEEP WITH PETS.   Day of Surgery: Please shower morning of surgery  Wear Clean/Comfortable clothing the morning of surgery Do not apply any deodorants/lotions.   Remember to brush your teeth  WITH YOUR REGULAR TOOTHPASTE.   Questions were answered. Patient verbalized understanding of instructions.

## 2022-05-28 ENCOUNTER — Encounter (HOSPITAL_COMMUNITY)
Admission: RE | Disposition: A | Discharge status: Home / Self Care | Payer: Self-pay | Source: Home / Self Care | Attending: Student

## 2022-05-28 ENCOUNTER — Encounter (HOSPITAL_COMMUNITY): Payer: Self-pay | Admitting: Student

## 2022-05-28 ENCOUNTER — Ambulatory Visit (HOSPITAL_COMMUNITY)
Admission: RE | Admit: 2022-05-28 | Discharge: 2022-05-28 | Disposition: A | Discharge status: Home / Self Care | Payer: Medicare PPO | Attending: Student | Admitting: Student

## 2022-05-28 ENCOUNTER — Ambulatory Visit (HOSPITAL_COMMUNITY): Payer: Medicare PPO

## 2022-05-28 ENCOUNTER — Ambulatory Visit (HOSPITAL_BASED_OUTPATIENT_CLINIC_OR_DEPARTMENT_OTHER): Payer: Medicare PPO | Admitting: Physician Assistant

## 2022-05-28 ENCOUNTER — Ambulatory Visit (HOSPITAL_COMMUNITY): Payer: Medicare PPO | Admitting: Physician Assistant

## 2022-05-28 DIAGNOSIS — Z09 Encounter for follow-up examination after completed treatment for conditions other than malignant neoplasm: Secondary | ICD-10-CM | POA: Diagnosis not present

## 2022-05-28 DIAGNOSIS — J9809 Other diseases of bronchus, not elsewhere classified: Secondary | ICD-10-CM | POA: Diagnosis not present

## 2022-05-28 DIAGNOSIS — K220 Achalasia of cardia: Secondary | ICD-10-CM | POA: Insufficient documentation

## 2022-05-28 DIAGNOSIS — Z85118 Personal history of other malignant neoplasm of bronchus and lung: Secondary | ICD-10-CM | POA: Insufficient documentation

## 2022-05-28 DIAGNOSIS — I10 Essential (primary) hypertension: Secondary | ICD-10-CM | POA: Diagnosis not present

## 2022-05-28 DIAGNOSIS — J449 Chronic obstructive pulmonary disease, unspecified: Secondary | ICD-10-CM | POA: Diagnosis not present

## 2022-05-28 DIAGNOSIS — Z87891 Personal history of nicotine dependence: Secondary | ICD-10-CM | POA: Diagnosis not present

## 2022-05-28 DIAGNOSIS — K219 Gastro-esophageal reflux disease without esophagitis: Secondary | ICD-10-CM | POA: Diagnosis not present

## 2022-05-28 DIAGNOSIS — I480 Paroxysmal atrial fibrillation: Secondary | ICD-10-CM | POA: Diagnosis not present

## 2022-05-28 DIAGNOSIS — R911 Solitary pulmonary nodule: Secondary | ICD-10-CM | POA: Insufficient documentation

## 2022-05-28 DIAGNOSIS — I472 Ventricular tachycardia, unspecified: Secondary | ICD-10-CM | POA: Insufficient documentation

## 2022-05-28 DIAGNOSIS — Z9221 Personal history of antineoplastic chemotherapy: Secondary | ICD-10-CM | POA: Insufficient documentation

## 2022-05-28 DIAGNOSIS — K30 Functional dyspepsia: Secondary | ICD-10-CM | POA: Diagnosis not present

## 2022-05-28 DIAGNOSIS — R918 Other nonspecific abnormal finding of lung field: Secondary | ICD-10-CM | POA: Diagnosis not present

## 2022-05-28 DIAGNOSIS — Z08 Encounter for follow-up examination after completed treatment for malignant neoplasm: Secondary | ICD-10-CM | POA: Insufficient documentation

## 2022-05-28 DIAGNOSIS — I3139 Other pericardial effusion (noninflammatory): Secondary | ICD-10-CM | POA: Insufficient documentation

## 2022-05-28 DIAGNOSIS — I493 Ventricular premature depolarization: Secondary | ICD-10-CM | POA: Insufficient documentation

## 2022-05-28 DIAGNOSIS — Z79899 Other long term (current) drug therapy: Secondary | ICD-10-CM | POA: Diagnosis not present

## 2022-05-28 DIAGNOSIS — Z923 Personal history of irradiation: Secondary | ICD-10-CM | POA: Insufficient documentation

## 2022-05-28 DIAGNOSIS — I509 Heart failure, unspecified: Secondary | ICD-10-CM | POA: Diagnosis not present

## 2022-05-28 DIAGNOSIS — J189 Pneumonia, unspecified organism: Secondary | ICD-10-CM | POA: Diagnosis not present

## 2022-05-28 DIAGNOSIS — J209 Acute bronchitis, unspecified: Secondary | ICD-10-CM | POA: Diagnosis not present

## 2022-05-28 HISTORY — PX: HEMOSTASIS CONTROL: SHX6838

## 2022-05-28 HISTORY — DX: Gastroparesis: K31.84

## 2022-05-28 HISTORY — PX: BRONCHIAL BIOPSY: SHX5109

## 2022-05-28 HISTORY — DX: Achalasia of cardia: K22.0

## 2022-05-28 HISTORY — PX: BRONCHIAL WASHINGS: SHX5105

## 2022-05-28 HISTORY — PX: BRONCHIAL NEEDLE ASPIRATION BIOPSY: SHX5106

## 2022-05-28 HISTORY — PX: BRONCHIAL BRUSHINGS: SHX5108

## 2022-05-28 SURGERY — BRONCHOSCOPY, WITH BIOPSY USING ELECTROMAGNETIC NAVIGATION
Anesthesia: General

## 2022-05-28 MED ORDER — ROCURONIUM BROMIDE 10 MG/ML (PF) SYRINGE
PREFILLED_SYRINGE | INTRAVENOUS | Status: DC | PRN
  Administered 2022-05-28: 40 mg via INTRAVENOUS

## 2022-05-28 MED ORDER — PROPOFOL 500 MG/50ML IV EMUL
INTRAVENOUS | Status: DC | PRN
  Administered 2022-05-28: 125 ug/kg/min via INTRAVENOUS

## 2022-05-28 MED ORDER — SUGAMMADEX SODIUM 200 MG/2ML IV SOLN
INTRAVENOUS | Status: DC | PRN
  Administered 2022-05-28: 100 mg via INTRAVENOUS

## 2022-05-28 MED ORDER — LACTATED RINGERS IV SOLN: INTRAVENOUS | Status: DC

## 2022-05-28 MED ORDER — DEXAMETHASONE SODIUM PHOSPHATE 10 MG/ML IJ SOLN
INTRAMUSCULAR | Status: DC | PRN
  Administered 2022-05-28: 4 mg via INTRAVENOUS

## 2022-05-28 MED ORDER — PHENYLEPHRINE 80 MCG/ML (10ML) SYRINGE FOR IV PUSH (FOR BLOOD PRESSURE SUPPORT)
PREFILLED_SYRINGE | INTRAVENOUS | Status: DC | PRN
  Administered 2022-05-28: 80 ug via INTRAVENOUS

## 2022-05-28 MED ORDER — OXYCODONE HCL 5 MG PO TABS: 5.0000 mg | ORAL_TABLET | Freq: Once | ORAL | Status: DC | PRN

## 2022-05-28 MED ORDER — PROMETHAZINE HCL 25 MG/ML IJ SOLN: 6.2500 mg | INTRAMUSCULAR | Status: DC | PRN

## 2022-05-28 MED ORDER — PROPOFOL 10 MG/ML IV BOLUS
INTRAVENOUS | Status: DC | PRN
  Administered 2022-05-28: 20 mg via INTRAVENOUS
  Administered 2022-05-28: 150 mg via INTRAVENOUS

## 2022-05-28 MED ORDER — HYDROMORPHONE HCL 1 MG/ML IJ SOLN: 0.2500 mg | INTRAMUSCULAR | Status: DC | PRN

## 2022-05-28 MED ORDER — ONDANSETRON HCL 4 MG/2ML IJ SOLN
INTRAMUSCULAR | Status: DC | PRN
  Administered 2022-05-28: 4 mg via INTRAVENOUS

## 2022-05-28 MED ORDER — PHENYLEPHRINE HCL-NACL 20-0.9 MG/250ML-% IV SOLN
INTRAVENOUS | Status: DC | PRN
  Administered 2022-05-28: 15 ug/min via INTRAVENOUS

## 2022-05-28 MED ORDER — CHLORHEXIDINE GLUCONATE 0.12 % MT SOLN
15.0000 mL | Freq: Once | OROMUCOSAL | Status: AC
  Administered 2022-05-28: 15 mL via OROMUCOSAL
  Filled 2022-05-28 (×2): qty 15

## 2022-05-28 MED ORDER — AMISULPRIDE (ANTIEMETIC) 5 MG/2ML IV SOLN: 10.0000 mg | Freq: Once | INTRAVENOUS | Status: DC | PRN

## 2022-05-28 MED ORDER — OXYCODONE HCL 5 MG/5ML PO SOLN: 5.0000 mg | Freq: Once | ORAL | Status: DC | PRN

## 2022-05-28 MED ORDER — LIDOCAINE 2% (20 MG/ML) 5 ML SYRINGE
INTRAMUSCULAR | Status: DC | PRN
  Administered 2022-05-28: 40 mg via INTRAVENOUS

## 2022-05-28 MED ORDER — SUCCINYLCHOLINE CHLORIDE 200 MG/10ML IV SOSY
PREFILLED_SYRINGE | INTRAVENOUS | Status: DC | PRN
  Administered 2022-05-28: 120 mg via INTRAVENOUS

## 2022-05-28 NOTE — Discharge Instructions (Signed)
-   Normal to have cough with small blood clots, bloody streaks for first 1-3 days after bronchoscopy. If coughing up clot size of your thumb or larger then call our office [phone] and if persistent, especially if you're also developing worsening shortness of breath then would plan to head to ED. - Fever is common in the first 1-2 days after bronchoscopy and often is simply your body's reaction to saline exposure in your lung - just take tylenol. - Will call you in 3-5 business days when results become available

## 2022-05-28 NOTE — Interval H&P Note (Signed)
History and Physical Interval Note:  05/28/2022 8:51 AM  Claudia [name]  has presented today for surgery, with the diagnosis of LEFT LUNG MASS.  The various methods of treatment have been discussed with the patient and family. After consideration of risks, benefits and other options for treatment, the patient has consented to  Procedure(s): ROBOTIC ASSISTED NAVIGATIONAL BRONCHOSCOPY (N/A) as a surgical intervention.  The patient's history has been reviewed, patient examined, no change in status, stable for surgery.  I have reviewed the patient's chart and labs.  Questions were answered to the patient's satisfaction.     Maryjane Hurter

## 2022-05-28 NOTE — Op Note (Signed)
Video Bronchoscopy with Robotic Assisted Bronchoscopic Navigation   Date of Operation: 05/28/2022   Pre-op Diagnosis: Left lung mass  Post-op Diagnosis: Left lung mass  Surgeon: Walker Shadow  Anesthesia: General endotracheal anesthesia  Operation: Flexible video fiberoptic bronchoscopy with robotic assistance and biopsies.  Estimated Blood Loss: Minimal  Complications: None  Indications and History: Claudia [name] is a 77 y.o. female with history of COPD, limited stage small cell lung cancer dx 2011 sp chemoXRT c/b recurrence 2015 s/p RUL wedge resection with Dr. Cyndia Bent f/b chemotherapy now with hypermetabolic small left lung nodules and left lung mass. The risks, benefits, complications, treatment options and expected outcomes were discussed with the patient.  The possibilities of pneumothorax, pneumonia, reaction to medication, pulmonary aspiration, perforation of a viscus, bleeding, failure to diagnose a condition and creating a complication requiring transfusion or operation were discussed with the patient who freely signed the consent.    Description of Procedure: The patient was seen in the Preoperative Area, was examined and was deemed appropriate to proceed.  The patient was taken to [facility] endoscopy room 3, identified as Claudia [name] and the procedure verified as Flexible Video Fiberoptic Bronchoscopy.  A Time Out was held and the above information confirmed.   Prior to the date of the procedure a high-resolution CT scan of the chest was performed. Utilizing ION software program a virtual tracheobronchial tree was generated to allow the creation of distinct navigation pathways to the patient's parenchymal abnormalities. After being taken to the operating room general anesthesia was initiated and the patient  was orally intubated. The video fiberoptic bronchoscope was introduced via the endotracheal tube and a general inspection was performed which showed normal right and left lung  anatomy, aspiration of the bilateral mainstems was completed to remove any remaining secretions. Robotic catheter inserted into patient's endotracheal tube.   Target #1 LUL mass: The distinct navigation pathways prepared prior to this procedure were then utilized to navigate to patient's lesion identified on CT scan, access to which proved challenging due to very tortuous and stenotic takeoff of lingula requiring bag ventilation for adequate lumen for passage of Ion catheter. CIOS imaging was used to aid navigation and confirm ideal location for biopsy. The robotic catheter was secured into place and the vision probe was withdrawn.  Lesion location was approximated using fluoroscopy and radial endobronchial ultrasound for peripheral targeting. Under fluoroscopic guidance transbronchial needle brushings, transbronchial needle biopsies, and transbronchial forceps biopsies were performed to be sent for cytology and pathology. A bronchioalveolar lavage was performed in the LUL and sent for cytology.  Target #2 FDG avid LLL 28m nodule: Not attempted as it was challenging to distinguish from nearby PA.  At the end of the procedure a general airway inspection was performed. There was minor oozing from lingula and 30cc iced saline applied for hemostasis without evidence of further bleeding. The bronchoscope was removed.  The patient tolerated the procedure well. There was no significant blood loss and there were no obvious complications. A post-procedural chest x-ray is pending.  Samples Target #1: 1. Transbronchial needle brushings from LUL mass 2. Transbronchial Wang needle biopsies from LUL mass 3. Transbronchial forceps biopsies from LUL mass 4. Bronchoalveolar lavage from LUL mass 5. Endobronchial biopsies from LUL mass    Plans:  The patient will be discharged from the PACU to home when recovered from anesthesia and after chest x-ray is reviewed. We will review the cytology, pathology and  microbiology results with the patient when they become  available. Outpatient followup will be with Dr. Julien Nordmann 06/02/22, myself 06/09/22.

## 2022-05-28 NOTE — Transfer of Care (Signed)
Immediate Anesthesia Transfer of Care Note  Patient: Claudia [name]  Procedure(s) Performed: ROBOTIC ASSISTED NAVIGATIONAL BRONCHOSCOPY BRONCHIAL BIOPSIES BRONCHIAL NEEDLE ASPIRATION BIOPSIES BRONCHIAL BRUSHINGS BRONCHIAL WASHINGS HEMOSTASIS CONTROL  Patient Location: PACU  Anesthesia Type:General  Level of Consciousness: awake, alert , and oriented  Airway & Oxygen Therapy: Patient Spontanous Breathing and Patient connected to nasal cannula oxygen  Post-op Assessment: Report given to RN and Post -op Vital signs reviewed and stable  Post vital signs: Reviewed and stable  Last Vitals:  Vitals Value Taken Time  BP 116/44 05/28/22 1028  Temp    Pulse 86 05/28/22 1030  Resp 20 05/28/22 1030  SpO2 96 % 05/28/22 1030  Vitals shown include unvalidated device data.  Last Pain:  Vitals:   05/28/22 0745  TempSrc:   PainSc: 0-No pain         Complications: No notable events documented.

## 2022-05-28 NOTE — Anesthesia Procedure Notes (Signed)
Procedure Name: Intubation Date/Time: 05/28/2022 9:16 AM  Performed by: Wilburn Cornelia, CRNAPre-anesthesia Checklist: Patient identified, Emergency Drugs available, Suction available, Patient being monitored and Timeout performed Patient Re-evaluated:Patient Re-evaluated prior to induction Oxygen Delivery Method: Circle system utilized Preoxygenation: Pre-oxygenation with 100% oxygen Induction Type: IV induction, Rapid sequence and Cricoid Pressure applied Laryngoscope Size: Mac and 3 Grade View: Grade I Tube type: Oral Tube size: 8.5 mm Number of attempts: 1 Placement Confirmation: positive ETCO2 and breath sounds checked- equal and bilateral Secured at: 21 cm Tube secured with: Tape Dental Injury: Teeth and Oropharynx as per pre-operative assessment

## 2022-05-29 LAB — CYTOLOGY - NON PAP

## 2022-05-29 NOTE — Anesthesia Postprocedure Evaluation (Signed)
Anesthesia Post Note  Patient: Claudia [name]  Procedure(s) Performed: ROBOTIC ASSISTED NAVIGATIONAL BRONCHOSCOPY BRONCHIAL BIOPSIES BRONCHIAL NEEDLE ASPIRATION BIOPSIES BRONCHIAL BRUSHINGS BRONCHIAL WASHINGS HEMOSTASIS CONTROL     Patient location during evaluation: PACU Anesthesia Type: General Level of consciousness: awake and alert Pain management: pain level controlled Vital Signs Assessment: post-procedure vital signs reviewed and stable Respiratory status: spontaneous breathing, nonlabored ventilation and respiratory function stable Cardiovascular status: blood pressure returned to baseline and stable Postop Assessment: no apparent nausea or vomiting Anesthetic complications: no   No notable events documented.  Last Vitals:  Vitals:   05/28/22 1045 05/28/22 1100  BP: (!) 111/50 (!) 122/59  Pulse: 87 85  Resp: 17 19  Temp:  36.4 C  SpO2: 92% 93%    Last Pain:  Vitals:   05/28/22 1100  TempSrc:   PainSc: 0-No pain                 Lynda Rainwater

## 2022-05-30 ENCOUNTER — Encounter (INDEPENDENT_AMBULATORY_CARE_PROVIDER_SITE_OTHER): Payer: Self-pay | Admitting: Gastroenterology

## 2022-05-31 ENCOUNTER — Encounter (HOSPITAL_COMMUNITY): Payer: Self-pay | Admitting: Student

## 2022-06-02 ENCOUNTER — Inpatient Hospital Stay: Payer: Medicare PPO | Attending: Nurse Practitioner | Admitting: Internal Medicine

## 2022-06-02 ENCOUNTER — Encounter: Payer: Self-pay | Admitting: Internal Medicine

## 2022-06-02 VITALS — BP 156/70 | HR 63 | Temp 97.8°F | Resp 18 | Wt 102.6 lb

## 2022-06-02 DIAGNOSIS — N2889 Other specified disorders of kidney and ureter: Secondary | ICD-10-CM | POA: Diagnosis not present

## 2022-06-02 DIAGNOSIS — R634 Abnormal weight loss: Secondary | ICD-10-CM | POA: Diagnosis not present

## 2022-06-02 DIAGNOSIS — K3184 Gastroparesis: Secondary | ICD-10-CM | POA: Diagnosis not present

## 2022-06-02 DIAGNOSIS — Z902 Acquired absence of lung [part of]: Secondary | ICD-10-CM | POA: Diagnosis not present

## 2022-06-02 DIAGNOSIS — C3490 Malignant neoplasm of unspecified part of unspecified bronchus or lung: Secondary | ICD-10-CM

## 2022-06-02 DIAGNOSIS — Z85118 Personal history of other malignant neoplasm of bronchus and lung: Secondary | ICD-10-CM | POA: Insufficient documentation

## 2022-06-02 DIAGNOSIS — R918 Other nonspecific abnormal finding of lung field: Secondary | ICD-10-CM | POA: Insufficient documentation

## 2022-06-02 MED ORDER — METHYLPREDNISOLONE 4 MG PO TBPK: ORAL_TABLET | ORAL | 0 refills | Status: DC

## 2022-06-02 MED ORDER — DOXYCYCLINE HYCLATE 100 MG PO TABS: 100.0000 mg | ORAL_TABLET | Freq: Two times a day (BID) | ORAL | 0 refills | Status: DC

## 2022-06-02 NOTE — Progress Notes (Signed)
[place] [phone]   Fax:([phone]) [phone]  OFFICE PROGRESS NOTE  [name], MD [address] [place] [address]  PRINCIPAL DIAGNOSIS: recurrent small cell lung cancer initially diagnosed as Limited stage small cell lung cancer in June 2011, with recurrence in June 2015.   PRIOR THERAPY:  Status post 4 cycles of systemic chemotherapy with carboplatin and etoposide. Last dose was given 11/14/2009. This was concurrent with radiotherapy. Status post prophylactic cranial irradiation completed January 28, 2010. Status post wedge resection of the right upper lobe lung nodule under the care of Dr. Cyndia Bent on 09/21/2013 and the final pathology was consistent with small cell lung cancer. systemic chemotherapy with carboplatin for AUC of 5 on day 1 and etoposide 120 mg/M2 on days 1, 2 and 3 with Neulasta support on day 4 every 3 weeks. Status post 4 cycles. First dose on 10/30/2013.  CURRENT THERAPY: Observation.  INTERVAL HISTORY: Claudia [name] 77 y.o. female returns to the clinic today for follow-up visit accompanied by her husband.  The patient is feeling fine today with no concerning complaints except for the persistent weight loss and occasional nausea.  She found on previous imaging studies including CT scan of the chest as well as PET scan to have suspicious disease recurrence in the left lung.  The patient underwent video bronchoscopy with robotic assisted bronchoscopic navigation under the care of Dr. Verlee Monte but the final pathology showed no evidence of malignancy.  She was also seen by Dr. [name] at [facility] gastroenterology for severe gastroparesis and possible achalasia.  She was referred to a physician at [facility] for further evaluation.  The patient denied having any current chest pain, shortness of breath, cough or hemoptysis.  She is here today for evaluation and discussion of her biopsy results and recommendation regarding her  condition.   MEDICAL HISTORY: Past Medical History:  Diagnosis Date   Achalasia    Anemia    Arthritis    Bradycardia    mild,may be due to beta blocker therapy   COPD (chronic obstructive pulmonary disease) (HCC)    Gastroparesis    GERD (gastroesophageal reflux disease)    otc   Hypertension 06/01/2019   pt denies   Local recurrence of lung cancer ([place]) dx'd 07/2013   rt thoracotomy chemo comp 12/2013   Lung cancer ([place]) 03/30/2008   Dr. Julien Nordmann, finished chemo, sp radiation, left upper    Lymphocytic colitis    Pneumonia     ALLERGIES:  is allergic to azithromycin, tussionex pennkinetic er [hydrocod poli-chlorphe poli er], and omeprazole.  MEDICATIONS:  Current Outpatient Medications  Medication Sig Dispense Refill   Calcium Carbonate Antacid (TUMS PO) Take 500-1,000 mg by mouth 3 (three) times daily as needed (indigestion/heartburn.).     Cholecalciferol (VITAMIN D-3) 25 MCG (1000 UT) CAPS Take 1,000 Units by mouth in the morning.     Coenzyme Q10 100 MG TABS Take 100 mg by mouth in the morning.     ferrous sulfate 325 (65 FE) MG tablet Take 1 tablet (325 mg total) by mouth daily. (Patient not taking: Reported on 05/18/2022) 30 tablet 3   metoCLOPramide (REGLAN) 5 MG tablet Take 1 tablet (5 mg total) by mouth 3 (three) times daily before meals. 180 tablet 3   Multiple Vitamin (MULTIVITAMIN WITH MINERALS) TABS tablet Take 1 tablet by mouth in the morning.     Probiotic Product (PROBIOTIC DAILY PO) Take 1 capsule by mouth in the  morning.     rosuvastatin (CRESTOR) 20 MG tablet Take 1 tablet (20 mg total) by mouth daily. 90 tablet 3   tretinoin (RETIN-A) 0.1 % cream Apply 1 application  topically at bedtime.     Turmeric Curcumin 500 MG CAPS Take 500 mg by mouth in the morning.     No current facility-administered medications for this visit.    SURGICAL HISTORY:  Past Surgical History:  Procedure Laterality Date   BRONCHIAL BIOPSY  05/28/2022   Procedure: BRONCHIAL  BIOPSIES;  Surgeon: Maryjane Hurter, MD;  Location: [facility];  Service: Pulmonary;;   BRONCHIAL BRUSHINGS  05/28/2022   Procedure: BRONCHIAL BRUSHINGS;  Surgeon: Maryjane Hurter, MD;  Location: [facility] ENDOSCOPY;  Service: Pulmonary;;   BRONCHIAL NEEDLE ASPIRATION BIOPSY  05/28/2022   Procedure: BRONCHIAL NEEDLE ASPIRATION BIOPSIES;  Surgeon: Maryjane Hurter, MD;  Location: [facility] ENDOSCOPY;  Service: Pulmonary;;   BRONCHIAL WASHINGS  05/28/2022   Procedure: BRONCHIAL WASHINGS;  Surgeon: Maryjane Hurter, MD;  Location: [facility] ENDOSCOPY;  Service: Pulmonary;;   BRONCHOSCOPY  2011   DILATION AND CURETTAGE OF UTERUS     ESOPHAGOGASTRODUODENOSCOPY (EGD) WITH PROPOFOL N/A 07/29/2021   Procedure: ESOPHAGOGASTRODUODENOSCOPY (EGD) WITH PROPOFOL;  Surgeon: Harvel Quale, MD;  [facility];  Service: Gastroenterology;  Laterality: N/A;  1235 ASA 2   HEMOSTASIS CONTROL  05/28/2022   Procedure: HEMOSTASIS CONTROL;  Surgeon: Maryjane Hurter, MD;  Location: [facility] ENDOSCOPY;  Service: Pulmonary;;   NECK SURGERY  1980's   THORACOTOMY Right 09/21/2013   Procedure: THORACOTOMY MAJOR;  Surgeon: Gaye Pollack, MD;  Location: [facility];  Service: Thoracic;  Laterality: Right;   UTERINE FIBROID SURGERY  2012   WEDGE RESECTION Right 09/21/2013   Procedure: RIGHT UPPER LOBE WEDGE RESECTION;  Surgeon: Gaye Pollack, MD;  Location: [place];  Service: Thoracic;  Laterality: Right;    REVIEW OF SYSTEMS:  Constitutional: positive for fatigue and weight loss Eyes: negative Ears, nose, mouth, throat, and face: negative Respiratory: negative Cardiovascular: negative Gastrointestinal: positive for nausea and delayed gastric emptying Genitourinary:negative Integument/breast: negative Hematologic/lymphatic: negative Musculoskeletal:negative Neurological: negative Behavioral/Psych: negative Endocrine: negative Allergic/Immunologic: negative   PHYSICAL EXAMINATION: General appearance: alert,  cooperative, fatigued, and no distress Head: Normocephalic, without obvious abnormality, atraumatic Neck: no adenopathy Lymph nodes: Cervical, supraclavicular, and axillary nodes normal. Resp: clear to auscultation bilaterally Back: symmetric, no curvature. ROM normal. No CVA tenderness. Cardio: regular rate and rhythm, S1, S2 normal, no murmur, click, rub or gallop GI: soft, non-tender; bowel sounds normal; no masses,  no organomegaly Extremities: extremities normal, atraumatic, no cyanosis or edema Neurologic: Alert and oriented X 3, normal strength and tone. Normal symmetric reflexes. Normal coordination and gait  ECOG PERFORMANCE STATUS: 1 - Symptomatic but completely ambulatory  Blood pressure (!) 156/70, pulse 63, temperature 97.8 F (36.6 C), temperature source Oral, resp. rate 18, weight 102 lb 9 oz (46.5 kg), SpO2 100 %.  LABORATORY DATA: Lab Results  Component Value Date   WBC 5.7 05/25/2022   HGB 11.3 (L) 05/25/2022   HCT 33.5 (L) 05/25/2022   MCV 92.0 05/25/2022   PLT 139 (L) 05/25/2022      Chemistry      Component Value Date/Time   NA 140 05/25/2022 1417   NA 141 11/02/2016 0919   K 3.9 05/25/2022 1417   K 4.2 11/02/2016 0919   CL 103 05/25/2022 1417   CL 103 08/03/2012 0842   CO2 31 05/25/2022 1417   CO2 29 11/02/2016  0919   BUN 11 05/25/2022 1417   BUN 13.9 11/02/2016 0919   CREATININE 0.75 05/25/2022 1417   CREATININE 1.0 11/02/2016 0919      Component Value Date/Time   CALCIUM 9.7 05/25/2022 1417   CALCIUM 10.9 (H) 11/02/2016 0919   ALKPHOS 80 05/25/2022 1417   ALKPHOS 78 11/02/2016 0919   AST 13 (L) 05/25/2022 1417   AST 19 11/02/2016 0919   ALT 6 05/25/2022 1417   ALT 12 11/02/2016 0919   BILITOT 0.5 05/25/2022 1417   BILITOT 0.47 11/02/2016 0919       RADIOGRAPHIC STUDIES: DG Chest Port 1 View  Result Date: 05/30/2022 CLINICAL DATA:  Congestive heart failure. EXAM: PORTABLE CHEST 1 VIEW COMPARISON:  10/07/2021. COPD chest CT dated  05/25/2022. FINDINGS: Normal sized heart. Stable left lung volume loss and scarring. Stable postsurgical changes on the right. The right lung is clear. Diffuse osteopenia. IMPRESSION: No acute abnormality. Electronically Signed   By: Claudie Revering M.D.   On: 05/30/2022 10:52   DG C-ARM BRONCHOSCOPY  Result Date: 05/28/2022 C-ARM BRONCHOSCOPY: Fluoroscopy was utilized by the requesting physician.  No radiographic interpretation.   CT Super D Chest Wo Contrast  Result Date: 05/26/2022 CLINICAL DATA:  Follow up lung mass. Non-small-cell lung cancer. * Tracking Code: BO * EXAM: CT CHEST WITHOUT CONTRAST TECHNIQUE: Multidetector CT imaging of the chest was performed using thin slice collimation for electromagnetic bronchoscopy planning purposes, without intravenous contrast. RADIATION DOSE REDUCTION: This exam was performed according to the departmental dose-optimization program which includes automated exposure control, adjustment of the mA and/or kV according to patient size and/or use of iterative reconstruction technique. COMPARISON:  Chest CT 04/06/2022 and older.  PET-CT scan 04/23/2022 FINDINGS: Cardiovascular: Normal caliber thoracic aorta with scattered vascular calcifications. The heart is nonenlarged. No pericardial effusion. Mediastinum/Nodes: On this non IV contrast exam there is no specific abnormal lymph node enlargement identified in the axillary region, hilum or mediastinum. Once again there is a dilated esophagus with luminal debris and wall thickening. Please correlate for any known history including for primary esophageal motility disorder. Lungs/Pleura: Centrilobular emphysematous changes seen of the right lung. Surgical changes identified along the margin of the minor fissure in the medial right upper lobe. No right-sided pleural effusion or pneumothorax. Few subtle areas of reticulonodular changes in the anterolateral right upper lobe are stable on image 56 of series 5 with some areas of  tiny nodularity such as series 5, image 60, image 66 amongst others. Extent and distribution of these areas is similar. Slightly larger area in the right lower lobe measuring 5 mm on the prior is stable today on series 5, image 98. No new right-sided lung nodule. There is volume loss of the left hemithorax with diffuse bandlike opacities, bronchiectasis and distortion. These areas were hypermetabolic on the prior examination from 04/23/2022. Exact measurements of nodules is somewhat difficult comparing differences in technique with lack of contrast and different modalities and slice thickness. Overall the areas show relatively similar configuration. Thickness of bandlike tissue posteriorly on image 63 series 5 measuring 2.8 cm today, going back to the prior examinations would have a thickness of 2.8 cm April 06, 2022. The prior contrast CT measured a low-density nodule along the inferior anterior aspect of the lingula with some surrounding ill-defined lung opacity. This area when attempting to measured in the same fashion on this noncontrast study measures a proximally 17 x 14 mm on series 2, image 35. Upper Abdomen: The adrenal glands are preserved  in the upper abdomen. Stable bilateral renal cysts including hyperdense lesion along the upper aspect of the right kidney. Please correlate with previous workup. Musculoskeletal: Mild degenerative changes noted along the spine. IMPRESSION: Overall appearance of the lungs is unchanged from the studies of January 2024. Bandlike areas of opacity with lung distortion and volume loss on the left side are similar. Surgical changes in the right lung. Reticulonodular changes in the right lung are similar. No developing new lymph node enlargement or mass lesion. Advanced emphysematous lung changes. Patulous debris-filled esophagus. Please correlate for any known esophageal motility disorder such as achalasia Aortic Atherosclerosis (ICD10-I70.0) and Emphysema (ICD10-J43.9).  Electronically Signed   By: Jill Side M.D.   On: 05/26/2022 17:02   DG ESOPHAGUS W SINGLE CM (SOL OR THIN BA)  Result Date: 05/21/2022 CLINICAL DATA:  Dilated esophagus on PET-CT containing fluid versus food debris, history of non-small cell lung cancer post radiation therapy, difficulty swallowing solids, question achalasia, history of gastroparesis EXAM: ESOPHOGRAM/BARIUM SWALLOW TECHNIQUE: Single contrast examination was performed using thin barium. Study was performed using achalasia protocol. Exam limited by finding; 12.5 mm diameter barium tablet was not administered see below. FLUOROSCOPY: Radiation Exposure Index (as provided by the fluoroscopic device): 10.2 mGy Kerma COMPARISON:  None Available. FINDINGS: Esophageal distention: Diffusely dilated. Extensive filling defects likely representing food debris, last food intake night before exam. Sharp beak like tapering of contrast column at GE junction. Prolonged retention of contrast in thoracic esophagus with failure of esophageal diameter to decrease over 5 minutes of imaging. Findings are consistent with achalasia. Filling defects:  None 12.5 mm barium tablet: Not administered due to marked narrowing of GE junction Motility: Nearly atonic. Only occasional secondary and tertiary waves are visualized. Mucosa:  Smooth without irregularity or ulceration Hypopharynx/cervical esophagus: Laryngeal penetration without aspiration. Minimal vallecular and piriform sinus residuals partially clear with second swallow. Hiatal hernia:  Absent GE reflux:  Absent Other:  N/A IMPRESSION: Dilated atonic esophagus with sharp beaked tapered narrowing at the GE junction consistent with achalasia. Retained food debris within esophagus from dinner the evening before. Laryngeal penetration without aspiration. Findings called to Dr. Jenetta Downer on 05/21/2022 at 1206 hours. Electronically Signed   By: Lavonia Dana M.D.   On: 05/21/2022 12:10    ASSESSMENT AND PLAN:  This is a  very pleasant 77 years old white female with recurrent small cell lung cancer that was initially diagnosed in June 2011. She underwent systemic chemotherapy was carboplatin and etoposide concurrent with radiation and followed by prophylactic cranial irradiation. The patient was on observation since November 2011 that she had recurrence with right upper lobe nodule in June 2015 status post wedge resection that was consistent with recurrent small cell lung cancer. She was then treated with systemic chemotherapy again with carboplatin and etoposide for 4 cycles. She is currently on observe patient and feeling fine with no concerning complaints except for the delayed gastric emptying.  She is followed by gastroenterology. Her CT scan of the chest showed some changes in the collapsed left upper lobe concerning for disease recurrence with scattered small nodules throughout the left lung that has either increased or new from the previous imaging studies.  This was followed by a PET scan that showed extensive recurrent tumor in the left lung with hypermetabolic left lung nodules consistent with metastatic disease but no mediastinal or hilar adenopathy. The patient underwent bronchoscopy with biopsy of the suspicious area in the left lung but there was no evidence for malignancy. I discussed  the biopsy results with the patient and her husband and recommended for her to continue on observation with repeat CT scan of the chest in 2 months. In the meantime I will start the patient on Medrol Dosepak as well as 2 weeks course of doxycycline for the possible inflammatory process in the left lung. For the gastroparesis, she is followed by gastroenterology and expected to have a referral to [facility] for further evaluation and intervention. The patient is followed by urology for the suspicious renal mass. She was advised to call immediately if she has any other concerning symptoms in the interval. All questions were answered.  The patient knows to call the clinic with any problems, questions or concerns. We can certainly see the patient much sooner if necessary.  Disclaimer: This note was dictated with voice recognition software. Similar sounding words can inadvertently be transcribed and may not be corrected upon review.

## 2022-06-06 NOTE — Progress Notes (Signed)
Synopsis: Referred for abnormal PET/CT by [name], MD  Subjective:   PATIENT ID: Claudia [name] GENDER: female DOB: 31-Dec-1945, MRN: [id number]  No chief complaint on file.  4yF with history of COPD, limited stage small cell lung cancer dx 2011 sp chemoXRT c/b recurrence 2015 s/p RUL wedge resection with Dr. Cyndia Bent f/b chemotherapy, PCE, achalasia, GERD, HTN, referred with concern for disease recurrence LUL  She has no CP, cough, DOE. No unintentional weight loss. THough she doesn't report overt dysphagia she does regurgitate food, spit food up after eating.   Otherwise pertinent review of systems is negative.    Past Medical History:  Diagnosis Date   Achalasia    Anemia    Arthritis    Bradycardia    mild,may be due to beta blocker therapy   COPD (chronic obstructive pulmonary disease) (HCC)    Gastroparesis    GERD (gastroesophageal reflux disease)    otc   Hypertension 06/01/2019   pt denies   Local recurrence of lung cancer ([place]) dx'd 07/2013   rt thoracotomy chemo comp 12/2013   Lung cancer ([place]) 03/30/2008   Dr. Julien Nordmann, finished chemo, sp radiation, left upper    Lymphocytic colitis    Pneumonia      Family History  Problem Relation Age of Onset   Brain cancer Mother        brain cancer   Emphysema Father    Diabetes Son    Heart disease Paternal Grandfather    Breast cancer Maternal Aunt      Past Surgical History:  Procedure Laterality Date   BRONCHIAL BIOPSY  05/28/2022   Procedure: BRONCHIAL BIOPSIES;  Surgeon: Maryjane Hurter, MD;  Location: [place];  Service: Pulmonary;;   BRONCHIAL BRUSHINGS  05/28/2022   Procedure: BRONCHIAL BRUSHINGS;  Surgeon: Maryjane Hurter, MD;  Location: [facility] ENDOSCOPY;  Service: Pulmonary;;   BRONCHIAL NEEDLE ASPIRATION BIOPSY  05/28/2022   Procedure: BRONCHIAL NEEDLE ASPIRATION BIOPSIES;  Surgeon: Maryjane Hurter, MD;  Location: [facility] ENDOSCOPY;  Service: Pulmonary;;   BRONCHIAL WASHINGS  05/28/2022    Procedure: BRONCHIAL WASHINGS;  Surgeon: Maryjane Hurter, MD;  Location: [facility] ENDOSCOPY;  Service: Pulmonary;;   BRONCHOSCOPY  2011   DILATION AND CURETTAGE OF UTERUS     ESOPHAGOGASTRODUODENOSCOPY (EGD) WITH PROPOFOL N/A 07/29/2021   Procedure: ESOPHAGOGASTRODUODENOSCOPY (EGD) WITH PROPOFOL;  Surgeon: Harvel Quale, MD;  [facility];  Service: Gastroenterology;  Laterality: N/A;  1235 ASA 2   HEMOSTASIS CONTROL  05/28/2022   Procedure: HEMOSTASIS CONTROL;  Surgeon: Maryjane Hurter, MD;  Location: [facility] ENDOSCOPY;  Service: Pulmonary;;   NECK SURGERY  1980's   THORACOTOMY Right 09/21/2013   Procedure: THORACOTOMY MAJOR;  Surgeon: Gaye Pollack, MD;  Location: [facility] OR;  Service: Thoracic;  Laterality: Right;   UTERINE FIBROID SURGERY  2012   WEDGE RESECTION Right 09/21/2013   Procedure: RIGHT UPPER LOBE WEDGE RESECTION;  Surgeon: Gaye Pollack, MD;  Location: [facility] OR;  Service: Thoracic;  Laterality: Right;    Social History   Socioeconomic History   Marital status: Married    Spouse name: Not on file   Number of children: Not on file   Years of education: Not on file   Highest education level: Not on file  Occupational History   Occupation: clerical    Employer: [employer]  Tobacco Use   Smoking status: Former    Packs/day: 1.00    Years: 35.00    Total  pack years: 35.00    Types: Cigarettes    Quit date: 03/31/2007    Years since quitting: 15.1    Passive exposure: Past   Smokeless tobacco: Never  Vaping Use   Vaping Use: Never used  Substance and Sexual Activity   Alcohol use: Yes    Comment: occassional about 3 drinks per year   Drug use: No   Sexual activity: Never  Other Topics Concern   Not on file  Social History Narrative   Not on file   Social Determinants of Health   Financial Resource Strain: Not on file  Food Insecurity: Not on file  Transportation Needs: Not on file  Physical Activity: Not on file  Stress: Not on file  Social  Connections: Not on file  Intimate Partner Violence: Not on file     Allergies  Allergen Reactions   Azithromycin Shortness Of Breath and Rash    Took Tussionex at same time Jan 2013.   Tussionex Pennkinetic Er [Hydrocod Poli-Chlorphe Poli Er] Shortness Of Breath and Rash    Took Azithromycin at same time Jan 2013   Omeprazole Rash     Outpatient Medications Prior to Visit  Medication Sig Dispense Refill   Calcium Carbonate Antacid (TUMS PO) Take 500-1,000 mg by mouth 3 (three) times daily as needed (indigestion/heartburn.).     Cholecalciferol (VITAMIN D-3) 25 MCG (1000 UT) CAPS Take 1,000 Units by mouth in the morning.     Coenzyme Q10 100 MG TABS Take 100 mg by mouth in the morning.     doxycycline (VIBRA-TABS) 100 MG tablet Take 1 tablet (100 mg total) by mouth 2 (two) times daily. 30 tablet 0   ferrous sulfate 325 (65 FE) MG tablet Take 1 tablet (325 mg total) by mouth daily. (Patient not taking: Reported on 05/18/2022) 30 tablet 3   methylPREDNISolone (MEDROL DOSEPAK) 4 MG TBPK tablet Use as instructed 21 tablet 0   metoCLOPramide (REGLAN) 5 MG tablet Take 1 tablet (5 mg total) by mouth 3 (three) times daily before meals. 180 tablet 3   Multiple Vitamin (MULTIVITAMIN WITH MINERALS) TABS tablet Take 1 tablet by mouth in the morning.     Probiotic Product (PROBIOTIC DAILY PO) Take 1 capsule by mouth in the morning.     rosuvastatin (CRESTOR) 20 MG tablet Take 1 tablet (20 mg total) by mouth daily. 90 tablet 3   tretinoin (RETIN-A) 0.1 % cream Apply 1 application  topically at bedtime.     Turmeric Curcumin 500 MG CAPS Take 500 mg by mouth in the morning.     No facility-administered medications prior to visit.       Objective:   Physical Exam:  General appearance: 77 y.o., female, NAD, conversant  Eyes: anicteric sclerae; PERRL, tracking appropriately HENT: NCAT; MMM Neck: Trachea midline; no lymphadenopathy, no JVD Lungs: diminished bilaterally, with normal respiratory  effort CV: RRR, no murmur  Abdomen: Soft, non-tender; non-distended, BS present  Extremities: No peripheral edema, warm Skin: Normal turgor and texture; no rash Psych: Appropriate affect Neuro: Alert and oriented to person and place, no focal deficit     There were no vitals filed for this visit.    on RA BMI Readings from Last 3 Encounters:  06/02/22 18.76 kg/m  05/28/22 18.42 kg/m  05/18/22 18.42 kg/m   Wt Readings from Last 3 Encounters:  06/02/22 102 lb 9 oz (46.5 kg)  05/28/22 100 lb 11.2 oz (45.7 kg)  05/18/22 100 lb 11.2 oz (45.7 kg)  CBC    Component Value Date/Time   WBC 5.7 05/25/2022 1417   WBC 3.6 (L) 04/30/2022 1051   RBC 3.64 (L) 05/25/2022 1417   HGB 11.3 (L) 05/25/2022 1417   HGB 11.9 11/02/2016 0919   HCT 33.5 (L) 05/25/2022 1417   HCT 36.6 11/02/2016 0919   PLT 139 (L) 05/25/2022 1417   PLT 144 (L) 11/02/2016 0919   MCV 92.0 05/25/2022 1417   MCV 93.8 11/02/2016 0919   MCH 31.0 05/25/2022 1417   MCHC 33.7 05/25/2022 1417   RDW 14.2 05/25/2022 1417   RDW 14.4 11/02/2016 0919   LYMPHSABS 0.6 (L) 05/25/2022 1417   LYMPHSABS 0.6 (L) 11/02/2016 0919   MONOABS 0.8 05/25/2022 1417   MONOABS 0.8 11/02/2016 0919   EOSABS 0.0 05/25/2022 1417   EOSABS 0.1 11/02/2016 0919   BASOSABS 0.0 05/25/2022 1417   BASOSABS 0.0 11/02/2016 0919     Chest Imaging: PET/CT 04/24/22 with multiple hypermetabolic pulmonary nodules - LUL 69m, LLL 915m and large hypermetabolic mass-like consolidation lingula  Pulmonary Functions Testing Results:    Latest Ref Rng & Units 09/14/2013    8:08 AM  PFT Results  FVC-Pre L 2.05   FVC-Predicted Pre % 72   FVC-Post L 2.20   FVC-Predicted Post % 77   Pre FEV1/FVC % % 55   Post FEV1/FCV % % 59   FEV1-Pre L 1.14   FEV1-Predicted Pre % 52   FEV1-Post L 1.29   DLCO uncorrected ml/min/mmHg 11.11   DLCO UNC% % 51   DLCO corrected ml/min/mmHg 11.11   DLCO COR %Predicted % 51   DLVA Predicted % 85   TLC L 4.04   TLC %  Predicted % 85   RV % Predicted % 95    Moderate obstruction, moderately reduced diffusing capacity    Echocardiogram 11/2021:    1. Left ventricular ejection fraction, by estimation, is 60 to 65%. The  left ventricle has normal function. There is mild concentric left  ventricular hypertrophy.   2. Right ventricular systolic function is normal.   3. A small pericardial effusion is present. The pericardial effusion is  anterior to the right ventricle. There is no evidence of cardiac  tamponade.        Assessment & Plan:   # LUL hypermetabolic mass # LLL hypermetabolic nodule # History of limited stage small cell lung cancer dx 2011 sp chemoXRT c/b recurrence 2015 s/p RUL wedge resection with Dr. BaCyndia Bent/b chemotherapy  # Pericardial effusion without tamponade  # COPD Gold A  Plan: - we will plan navigation bronchoscopy under general anesthesia. Reviewed risks respiratory failure <5%, pneumothorax requiring chest tube 1%, major bleeding 03/998. Agrees to proceed.  - clinic visit in 3 weeks to review results tentatively     NaMaryjane HurterMD [facility] 06/06/2022 5:25 PM

## 2022-06-09 ENCOUNTER — Ambulatory Visit: Payer: Medicare PPO | Admitting: Student

## 2022-06-09 ENCOUNTER — Encounter: Payer: Self-pay | Admitting: Student

## 2022-06-09 VITALS — BP 94/58 | HR 85 | Temp 97.8°F | Ht 62.0 in | Wt 97.4 lb

## 2022-06-09 DIAGNOSIS — J449 Chronic obstructive pulmonary disease, unspecified: Secondary | ICD-10-CM

## 2022-06-09 DIAGNOSIS — R918 Other nonspecific abnormal finding of lung field: Secondary | ICD-10-CM | POA: Diagnosis not present

## 2022-06-09 NOTE — Patient Instructions (Signed)
-   If any bothersome cough, trouble breathing with exertion that limits your activities call to make appointment or discuss with [place] over the phone - happy to help if there's anything we can do for you after next ct chest - hopefully no need for [place] though!

## 2022-07-05 NOTE — Progress Notes (Deleted)
Cardiology Office Note   Date:  07/05/2022   ID:  Claudia [name], DOB Feb 17, 1946, MRN [id number]  PCP:  Ignatius Specking, MD  Cardiologist:   Dietrich Pates, MD   Pt presents for follow up of atrial fibrillation     History of Present Illness: Claudia [name] is a 77 y.o. female with a history of small cell lung CA (2011, recurrence 2015), bradcyardia (may be related to meds) , COPD and GERD     The pt was hosp in 10/07/2021 for CP, afib, hypotension.   Rx IV dilt   COnverted to SR vs atrial arrhythma.     Diltiazem stopped    Pt placed on Multaq   Echo done   LVEF 60 to 65%  Moderate pericardial effusion   Rx colchcine     Pt denies palpitations   Never felt palpitations.    No chest pains   No dizzinesss  Breathing is OK   Food slow to go down    I saw the pt in clinic in Dec 2023    No outpatient medications have been marked as taking for the 07/06/22 encounter (Appointment) with Pricilla Riffle, MD.     Allergies:   Azithromycin, Tussionex pennkinetic er Larey Seat poli-chlorphe poli er], and Omeprazole   Past Medical History:  Diagnosis Date   Achalasia    Anemia    Arthritis    Bradycardia    mild,may be due to beta blocker therapy   COPD (chronic obstructive pulmonary disease) (HCC)    Gastroparesis    GERD (gastroesophageal reflux disease)    otc   Hypertension 06/01/2019   pt denies   Local recurrence of lung cancer (HCC) dx'd 07/2013   rt thoracotomy chemo comp 12/2013   Lung cancer (HCC) 03/30/2008   Dr. Arbutus Ped, finished chemo, sp radiation, left upper    Lymphocytic colitis    Pneumonia     Past Surgical History:  Procedure Laterality Date   BRONCHIAL BIOPSY  05/28/2022   Procedure: BRONCHIAL BIOPSIES;  Surgeon: Omar Person, MD;  Location: [facility] ENDOSCOPY;  Service: Pulmonary;;   BRONCHIAL BRUSHINGS  05/28/2022   Procedure: BRONCHIAL BRUSHINGS;  Surgeon: Omar Person, MD;  Location: [facility] ENDOSCOPY;  Service: Pulmonary;;   BRONCHIAL NEEDLE ASPIRATION  BIOPSY  05/28/2022   Procedure: BRONCHIAL NEEDLE ASPIRATION BIOPSIES;  Surgeon: Omar Person, MD;  Location: [facility] ENDOSCOPY;  Service: Pulmonary;;   BRONCHIAL WASHINGS  05/28/2022   Procedure: BRONCHIAL WASHINGS;  Surgeon: Omar Person, MD;  Location: [facility] ENDOSCOPY;  Service: Pulmonary;;   BRONCHOSCOPY  2011   DILATION AND CURETTAGE OF UTERUS     ESOPHAGOGASTRODUODENOSCOPY (EGD) WITH PROPOFOL N/A 07/29/2021   Procedure: ESOPHAGOGASTRODUODENOSCOPY (EGD) WITH PROPOFOL;  Surgeon: Dolores Frame, MD;  [facility];  Service: Gastroenterology;  Laterality: N/A;  1235 ASA 2   HEMOSTASIS CONTROL  05/28/2022   Procedure: HEMOSTASIS CONTROL;  Surgeon: Omar Person, MD;  Location: [facility] ENDOSCOPY;  Service: Pulmonary;;   NECK SURGERY  1980's   THORACOTOMY Right 09/21/2013   Procedure: THORACOTOMY MAJOR;  Surgeon: Alleen Borne, MD;  Location: [facility] OR;  Service: Thoracic;  Laterality: Right;   UTERINE FIBROID SURGERY  2012   WEDGE RESECTION Right 09/21/2013   Procedure: RIGHT UPPER LOBE WEDGE RESECTION;  Surgeon: Alleen Borne, MD;  Location: [facility] OR;  Service: Thoracic;  Laterality: Right;     Social History:  The patient  reports that she quit smoking about 15  years ago. Her smoking use included cigarettes. She has a 35.00 pack-year smoking history. She has been exposed to tobacco smoke. She has never used smokeless tobacco. She reports current alcohol use. She reports that she does not use drugs.   Family History:  The patient's family history includes Brain cancer in her mother; Breast cancer in her maternal aunt; Diabetes in her son; Emphysema in her father; Heart disease in her paternal grandfather.    ROS:  Please see the history of present illness. All other systems are reviewed and  Negative to the above problem except as noted.    PHYSICAL EXAM: VS:  There were no vitals taken for this visit.  GEN: Thin 77 yo in no acute distress  HEENT: normal  Neck: no JVD,  carotid bruits, Cardiac: RRR; no murmurs  No LE edema  Respiratory:  clear to auscultation bilaterally, GI: soft, nontender, nondistended, + BS  No hepatomegaly  MS: no deformity Moving all extremities   Skin: warm and dry, no rash Neuro:  Strength and sensation are intact Psych: euthymic mood, full affect   EKG:  EKG is ordered today.  SR 85 bpm      Lipid Panel    Component Value Date/Time   TRIG 88 02/27/2022 1333   HDL 49 02/27/2022 1333      Wt Readings from Last 3 Encounters:  06/09/22 97 lb 6.4 oz (44.2 kg)  06/02/22 102 lb 9 oz (46.5 kg)  05/28/22 100 lb 11.2 oz (45.7 kg)      ASSESSMENT AND PLAN:  1  Atrial fibrllation     Remains in SR   Never sensed palpitaitons    Will set up for a 30 day monitor   Not on anticoagulation  Need to get records from GI   Had not been on anticoagulation due to concern for bleeding   2 Atherosclerosis   Will get lipids today   no symptoms of angina   3  Anemia   WIll check CBC today        Current medicines are reviewed at length with the patient today.  The patient does not have concerns regarding medicines.  Signed, Dietrich PatesPaula Jacorie Ernsberger, MD  07/05/2022 8:34 AM    [facility] [address], [place], [place]  [address] Phone: [phone]; Fax: [phone]

## 2022-07-06 ENCOUNTER — Encounter: Payer: Self-pay | Admitting: Internal Medicine

## 2022-07-06 ENCOUNTER — Ambulatory Visit: Payer: Medicare PPO | Attending: Internal Medicine | Admitting: Internal Medicine

## 2022-07-06 ENCOUNTER — Telehealth: Payer: Self-pay

## 2022-07-06 ENCOUNTER — Ambulatory Visit: Payer: Medicare PPO | Admitting: Internal Medicine

## 2022-07-06 DIAGNOSIS — R001 Bradycardia, unspecified: Secondary | ICD-10-CM | POA: Diagnosis not present

## 2022-07-06 DIAGNOSIS — Z8679 Personal history of other diseases of the circulatory system: Secondary | ICD-10-CM

## 2022-07-06 DIAGNOSIS — I480 Paroxysmal atrial fibrillation: Secondary | ICD-10-CM

## 2022-07-06 NOTE — Telephone Encounter (Signed)
  Patient Consent for Virtual Visit        Claudia [name] has provided verbal consent on 07/06/2022 for a virtual visit (video or telephone).   CONSENT FOR VIRTUAL VISIT FOR:  Claudia [name]  By participating in this virtual visit I agree to the following:  I hereby voluntarily request, consent and authorize [facility] and its employed or contracted physicians, physician assistants, nurse practitioners or other licensed health care professionals (the Practitioner), to provide me with telemedicine health care services (the "Services") as deemed necessary by the treating Practitioner. I acknowledge and consent to receive the Services by the Practitioner via telemedicine. I understand that the telemedicine visit will involve communicating with the Practitioner through live audiovisual communication technology and the disclosure of certain medical information by electronic transmission. I acknowledge that I have been given the opportunity to request an in-person assessment or other available alternative prior to the telemedicine visit and am voluntarily participating in the telemedicine visit.  I understand that I have the right to withhold or withdraw my consent to the use of telemedicine in the course of my care at any time, without affecting my right to future care or treatment, and that the Practitioner or I may terminate the telemedicine visit at any time. I understand that I have the right to inspect all information obtained and/or recorded in the course of the telemedicine visit and may receive copies of available information for a reasonable fee.  I understand that some of the potential risks of receiving the Services via telemedicine include:  Delay or interruption in medical evaluation due to technological equipment failure or disruption; Information transmitted may not be sufficient (e.g. poor resolution of images) to allow for appropriate medical decision making by the  Practitioner; and/or  In rare instances, security protocols could fail, causing a breach of personal health information.  Furthermore, I acknowledge that it is my responsibility to provide information about my medical history, conditions and care that is complete and accurate to the best of my ability. I acknowledge that Practitioner's advice, recommendations, and/or decision may be based on factors not within their control, such as incomplete or inaccurate data provided by me or distortions of diagnostic images or specimens that may result from electronic transmissions. I understand that the practice of medicine is not an exact science and that Practitioner makes no warranties or guarantees regarding treatment outcomes. I acknowledge that a copy of this consent can be made available to me via my patient portal [facility] MyChart), or I can request a printed copy by calling the office of  HeartCare.    I understand that my insurance will be billed for this visit.   I have read or had this consent read to me. I understand the contents of this consent, which adequately explains the benefits and risks of the Services being provided via telemedicine.  I have been provided ample opportunity to ask questions regarding this consent and the Services and have had my questions answered to my satisfaction. I give my informed consent for the services to be provided through the use of telemedicine in my medical care

## 2022-07-06 NOTE — Patient Instructions (Signed)
Medication Instructions:  Your physician recommends that you continue on your current medications as directed. Please refer to the Current Medication list given to you today.  *If you need a refill on your cardiac medications before your next appointment, please call your pharmacy*   Lab Work: None If you have labs (blood work) drawn today and your tests are completely normal, you will receive your results only by: MyChart Message (if you have MyChart) OR A paper copy in the mail If you have any lab test that is abnormal or we need to change your treatment, we will call you to review the results.   Testing/Procedures: None   Follow-Up: At [facility], you and your health needs are our priority.  As part of our continuing mission to provide you with exceptional heart care, we have created designated Provider Care Teams.  These Care Teams include your primary Cardiologist (physician) and Advanced Practice Providers (APPs -  Physician Assistants and Nurse Practitioners) who all work together to provide you with the care you need, when you need it.  We recommend signing up for the patient portal called "MyChart".  Sign up information is provided on this After Visit Summary.  MyChart is used to connect with patients for Virtual Visits (Telemedicine).  Patients are able to view lab/test results, encounter notes, upcoming appointments, etc.  Non-urgent messages can be sent to your provider as well.   To learn more about what you can do with MyChart, go to [email or web address].    Your next appointment:   Follow up with Dr. Ross in January 2025.   Other Instructions    

## 2022-07-06 NOTE — Progress Notes (Unsigned)
Virtual Visit via Telephone Note   Because of Claudia DurandLaura O [name]'s co-morbid illnesses, she is at least at moderate risk for complications without adequate follow up.  This format is felt to be most appropriate for this patient at this time.  The patient did not have access to video technology/had technical difficulties with video requiring transitioning to audio format only (telephone).  All issues noted in this document were discussed and addressed.  No physical exam could be performed with this format.  Please refer to the patient's chart for her consent to telehealth [name].    Date:  07/06/2022   ID:  Claudia [name], DOB 09-02-1945, MRN [id number]  Patient Location: Home Provider Location: Other:  Remote office    PCP:  Ignatius SpeckingVyas, Dhruv B, MD   Chief Complaint:   History of Present Illness:    Claudia [name] is a 77 y.o. female with a history of small cell lung CA (2011, recurrence 2015), bradcyardia (may be related to meds) , COPD and GERD     The pt was hosp in 10/07/2021 for CP, afib, hypotension.   Rx IV dilt   COnverted to SR vs atrial arrhythma.     Diltiazem stopped    Pt placed on Multaq   Echo done   LVEF 60 to 65%  Moderate pericardial effusion   Rx colchcine    Repeat echo in Sept 2023 showed small pericardial effusion I saw the pt in Dec 2023  Doing OK at the time    She was set up for a 30 day monitor in Jan 2024  No afib   ALl SR     Pt is not on anticoagulation  Since seen she denies palpitations  Breathing is OK  No dizziness   NO CP     Patient says she exercises some at home  Treadmill   Also does yoga and lifts weights  [facility] for evaluation of esophagus soon   Past Medical History:  Diagnosis Date   Achalasia    Anemia    Arthritis    Bradycardia    mild,may be due to beta blocker therapy   COPD (chronic obstructive pulmonary disease)    Gastroparesis    GERD (gastroesophageal reflux disease)    otc   Hypertension 06/01/2019    pt denies   Local recurrence of lung cancer dx'd 07/2013   rt thoracotomy chemo comp 12/2013   Lung cancer 03/30/2008   Dr. Arbutus PedMohamed, finished chemo, sp radiation, left upper    Lymphocytic colitis    Pneumonia    Past Surgical History:  Procedure Laterality Date   BRONCHIAL BIOPSY  05/28/2022   Procedure: BRONCHIAL BIOPSIES;  Surgeon: Omar PersonMeier, Nathaniel M, MD;  [facility];  Service: Pulmonary;;   BRONCHIAL BRUSHINGS  05/28/2022   Procedure: BRONCHIAL BRUSHINGS;  Surgeon: Omar PersonMeier, Nathaniel M, MD;  [facility];  Service: Pulmonary;;   BRONCHIAL NEEDLE ASPIRATION BIOPSY  05/28/2022   Procedure: BRONCHIAL NEEDLE ASPIRATION BIOPSIES;  Surgeon: Omar PersonMeier, Nathaniel M, MD;  [facility];  Service: Pulmonary;;   BRONCHIAL WASHINGS  05/28/2022   Procedure: BRONCHIAL WASHINGS;  Surgeon: Omar PersonMeier, Nathaniel M, MD;  [facility];  Service: Pulmonary;;   BRONCHOSCOPY  2011   DILATION AND CURETTAGE OF UTERUS     ESOPHAGOGASTRODUODENOSCOPY (EGD) WITH PROPOFOL N/A 07/29/2021   Procedure: ESOPHAGOGASTRODUODENOSCOPY (EGD) WITH PROPOFOL;  Surgeon: Dolores Frameastaneda Mayorga, Daniel, MD;  [facility]O [facility];  Service: Gastroenterology;  Laterality:  N/A;  1235 ASA 2   HEMOSTASIS CONTROL  05/28/2022   Procedure: HEMOSTASIS CONTROL;  Surgeon: Omar Person, MD;  Location: [facility] ENDOSCOPY;  Service: Pulmonary;;   NECK SURGERY  1980's   THORACOTOMY Right 09/21/2013   Procedure: THORACOTOMY MAJOR;  Surgeon: Alleen Borne, MD;  Location: [facility] OR;  Service: Thoracic;  Laterality: Right;   UTERINE FIBROID SURGERY  2012   WEDGE RESECTION Right 09/21/2013   Procedure: RIGHT UPPER LOBE WEDGE RESECTION;  Surgeon: Alleen Borne, MD;  Location: [facility] OR;  Service: Thoracic;  Laterality: Right;     Current Meds  Medication Sig   Calcium Carbonate Antacid (TUMS PO) Take 500-1,000 mg by mouth 3 (three) times daily as needed (indigestion/heartburn.).   Cholecalciferol (VITAMIN D-3) 25 MCG (1000 UT)  CAPS Take 1,000 Units by mouth in the morning.   Coenzyme Q10 100 MG TABS Take 100 mg by mouth in the morning.   metoCLOPramide (REGLAN) 5 MG tablet Take 1 tablet (5 mg total) by mouth 3 (three) times daily before meals.   Multiple Vitamin (MULTIVITAMIN WITH MINERALS) TABS tablet Take 1 tablet by mouth in the morning.   Probiotic Product (PROBIOTIC DAILY PO) Take 1 capsule by mouth in the morning.   rosuvastatin (CRESTOR) 20 MG tablet Take 1 tablet (20 mg total) by mouth daily.   tretinoin (RETIN-A) 0.1 % cream Apply 1 application  topically at bedtime.   Turmeric Curcumin 500 MG CAPS Take 500 mg by mouth in the morning.     Allergies:   Azithromycin, Tussionex pennkinetic er [hydrocod poli-chlorphe poli er], and Omeprazole   Social History   Tobacco Use   Smoking status: Former    Packs/day: 1.00    Years: 35.00    Additional pack years: 0.00    Total pack years: 35.00    Types: Cigarettes    Quit date: 03/31/2007    Years since quitting: 15.2    Passive exposure: Past   Smokeless tobacco: Never  Vaping Use   Vaping Use: Never used  Substance Use Topics   Alcohol use: Yes    Comment: occassional about 3 drinks per year   Drug use: No     Family Hx: The patient's family history includes Brain cancer in her mother; Breast cancer in her maternal aunt; Diabetes in her son; Emphysema in her father; Heart disease in her paternal grandfather.  ROS:   Please see the history of present illness.     All other systems reviewed and are negative.   Prior CV studies:   The following studies were reviewed today:  Monitor   Jan 2024 (30 day)  Rhythm:  SInus rhythm   Rates 54 to 137 bpm  Average HR 84 bpm    Rare PAC, PVC  One 6 beat VT.     NO significant arrhythmias    Echo Sept 2023  1. Left ventricular ejection fraction, by estimation, is 60 to 65%. The  left ventricle has normal function. There is mild concentric left  ventricular hypertrophy.   2. Right ventricular  systolic function is normal.   3. A small pericardial effusion is present. The pericardial effusion is  anterior to the right ventricle. There is no evidence of cardiac  tamponade.   Comparison(s): Compared to 10/28/21 improvement in pericardial effusion; no  fibrinous standing seen in this study.   Labs/Other Tests and Data Reviewed:    EKG:  No ECG reviewed.  Recent Labs: 10/07/2021: TSH 1.305 10/09/2021: Magnesium 1.8  05/25/2022: ALT 6; BUN 11; Creatinine 0.75; Hemoglobin 11.3; Platelet Count 139; Potassium 3.9; Sodium 140   Recent Lipid Panel Lab Results  Component Value Date/Time   TRIG 88 02/27/2022 01:33 PM   HDL 49 02/27/2022 01:33 PM    Wt Readings from Last 3 Encounters:  07/06/22 98 lb (44.5 kg)  06/09/22 97 lb 6.4 oz (44.2 kg)  06/02/22 102 lb 9 oz (46.5 kg)          Objective:    Vital Signs:  BP 117/60   Pulse 84   Ht 5\' 2"  (1.575 m)   Wt 98 lb (44.5 kg)   SpO2 97%   BMI 17.92 kg/m    VITAL SIGNS:  reviewed  ASSESSMENT & PLAN:    Atrial fib    Only one documented period of afib  Occurred last summer when she was hosp for CP, afib hypotension   Had moderate pericardial effusion at time  Effusion resolved    She has had no more documented afib   Remains symptom free    I would follow    Keep track of pulse   call if fast at rest    Consider Fit Bit or Apple watch     2  Pericardial effusion /pericarditis   Mod pericardial effusion on echo in July 2023  Rx colchicine  REsolved   REmains symptom free   Follow symptoms  3  Hx bradycardia    Pulse today OK  Denies dizziness   Follow    4   GI   Appt at [facility] to eval esophagus soon               Time:   Today, I have spent 20 minutes with the patient with telehealth technology discussing the above problems.     Medication Adjustments/Labs and Tests Ordered: Current medicines are reviewed at length with the patient today.  Concerns regarding medicines are outlined above.   Tests Ordered: No  orders of the defined types were placed in this encounter.   Medication Changes: No orders of the defined types were placed in this encounter.   Signed, Dietrich Pates, MD  07/06/2022 10:41 AM    [facility]

## 2022-07-21 ENCOUNTER — Ambulatory Visit (INDEPENDENT_AMBULATORY_CARE_PROVIDER_SITE_OTHER): Payer: Medicare PPO | Admitting: Gastroenterology

## 2022-07-21 ENCOUNTER — Encounter (INDEPENDENT_AMBULATORY_CARE_PROVIDER_SITE_OTHER): Payer: Self-pay | Admitting: Gastroenterology

## 2022-07-21 VITALS — BP 96/60 | HR 89 | Temp 98.7°F | Ht 62.0 in | Wt 97.2 lb

## 2022-07-21 DIAGNOSIS — K224 Dyskinesia of esophagus: Secondary | ICD-10-CM | POA: Diagnosis not present

## 2022-07-21 DIAGNOSIS — E440 Moderate protein-calorie malnutrition: Secondary | ICD-10-CM

## 2022-07-21 NOTE — Progress Notes (Unsigned)
Referring Provider: Ignatius Specking, MD Primary Care Physician:  Ignatius Specking, MD Primary GI Physician: Levon Hedger   Chief Complaint  Patient presents with   Weight Loss    Follow up on weight loss. Still not able to eat. Spits up a lot. Has a spit up bag she keeps and sometimes keeps her up at night. Sometimes vomits at night. Has appt with [facility] and [facility] and unsure why.    HPI:   Claudia [name] is a 76 y.o. female with past medical history of  recurrent small cell lung cancer status post chemotherapy with carboplatin and etoposide, status post radiation therapy and prophylactic cranial radiation, recurrent malignancy was treated with wedge resection, possible lymphocytic colitis, COPD, GERD, severe gastroparesis, possible achalasia   Patient presenting today for folow up of weight loss/suspected achalasia.  Last seen February 2024, at that time eating less than usual as she feels she gets full easily and states the food "will start coming up shortly after eating the meal".  Sometimes she feels nausea and vomits liquid contents. She denies having any dysphagia or chest pain. Very occasionally has heartburn but she still has some burping. does not have a BM every day, but more recently she has found to have more  infrequent Bms - possibly every other day with some straining.   As part of the surveillance of her SCLC, the patient had PET scan on 04/23/2022 that showed presence of extensive recurrent tumor in the left lung, as well as multiple hypermetabolic nodules consistent with metastatic disease.  There was also marked elevation of the esophagus concerning for achalasia.  She has been evaluated by oncology and pulmonology, has plan for possible bronchoscopy to evaluate further the possibility of recurrent small cell lung cancer.   Recommended start reglan 5mg  TID, esophageal manometry, time barium esophagram, small meals throughout the day, MRCP July 2025. Barium esophagram as below,  esophageal manometry not yet completed.   Present:  Patient reports continued "spitting up" intermittently, husband note she is spitting up white frothy substance when this occurs. She does not have this every night but when it occurs usually at night. She was at the beach last week and did good most of the week without spitting up. She is unsure if this has improved with use of reglan or not. She reports she can only eat small amounts of food. Husband states she is not eating much at all. Greasy foods tend to cause worsening symptoms. She has an upcoming appt with [facility] and [facility], she is not quite sure what these are for. She has an appt at [facility] June 20th and [facility] July 10th with Dr. Arlie Solomons. She is trying to find a protein shake that she tolerates. Weight is mostly stable today.   Barium esophagram 04/2022: concerning for achalasia  Last Colonoscopy:Last Colonoscopy:2013 - sigmoid diverticulosis, patchy loss of vascular marking, biopsies performed but no results available Last Endoscopy:5/2/23Fluid in the esophagus. Fluid aspiration performed. - A large amount of food (residue) in the stomach. - Normal examined duodenum GES:08/13/21: severe delay in gastric emptying, recommend eat small meals throughout the day     Past Medical History:  Diagnosis Date   Achalasia    Anemia    Arthritis    Bradycardia    mild,may be due to beta blocker therapy   COPD (chronic obstructive pulmonary disease)    Gastroparesis    GERD (gastroesophageal reflux disease)    otc   Hypertension 06/01/2019   pt  denies   Local recurrence of lung cancer dx'd 07/2013   rt thoracotomy chemo comp 12/2013   Lung cancer 03/30/2008   Dr. Arbutus Ped, finished chemo, sp radiation, left upper    Lymphocytic colitis    Pneumonia     Past Surgical History:  Procedure Laterality Date   BRONCHIAL BIOPSY  05/28/2022   Procedure: BRONCHIAL BIOPSIES;  Surgeon: Omar Person, MD;  [facility] ENDOSCOPY;  Service:  Pulmonary;;   BRONCHIAL BRUSHINGS  05/28/2022   Procedure: BRONCHIAL BRUSHINGS;  Surgeon: Omar Person, MD;  Location[facility] ENDOSCOPY;  Service: Pulmonary;;   BRONCHIAL NEEDLE ASPIRATION BIOPSY  05/28/2022   Procedure: BRONCHIAL NEEDLE ASPIRATION BIOPSIES;  Surgeon: Omar Person, MD;  Location: [facility] ENDOSCOPY;  Service: Pulmonary;;   BRONCHIAL WASHINGS  05/28/2022   Procedure: BRONCHIAL WASHINGS;  Surgeon: Omar Person, MD;  Location: [facility] ENDOSCOPY;  Service: Pulmonary;;   BRONCHOSCOPY  2011   DILATION AND CURETTAGE OF UTERUS     ESOPHAGOGASTRODUODENOSCOPY (EGD) WITH PROPOFOL N/A 07/29/2021   Procedure: ESOPHAGOGASTRODUODENOSCOPY (EGD) WITH PROPOFOL;  Surgeon: Dolores Frame, MD;  Location: [facility] ENDO SUITE;  Service: Gastroenterology;  Laterality: N/A;  1235 ASA 2   HEMOSTASIS CONTROL  05/28/2022   Procedure: HEMOSTASIS CONTROL;  Surgeon: Omar Person, MD;  Location: [facility] ENDOSCOPY;  Service: Pulmonary;;   NECK SURGERY  1980's   THORACOTOMY Right 09/21/2013   Procedure: THORACOTOMY MAJOR;  Surgeon: Alleen Borne, MD;  Location: [facility] OR;  Service: Thoracic;  Laterality: Right;   UTERINE FIBROID SURGERY  2012   WEDGE RESECTION Right 09/21/2013   Procedure: RIGHT UPPER LOBE WEDGE RESECTION;  Surgeon: Alleen Borne, MD;  Location: [facility] OR;  Service: Thoracic;  Laterality: Right;    Current Outpatient Medications  Medication Sig Dispense Refill   Calcium Carbonate Antacid (TUMS PO) Take 500-1,000 mg by mouth 3 (three) times daily as needed (indigestion/heartburn.).     Cholecalciferol (VITAMIN D-3) 25 MCG (1000 UT) CAPS Take 1,000 Units by mouth in the morning.     Coenzyme Q10 100 MG TABS Take 100 mg by mouth in the morning.     metoCLOPramide (REGLAN) 5 MG tablet Take 1 tablet (5 mg total) by mouth 3 (three) times daily before meals. 180 tablet 3   Multiple Vitamin (MULTIVITAMIN WITH MINERALS) TABS tablet Take 1 tablet by mouth in the morning.     Probiotic Product (PROBIOTIC  DAILY PO) Take 1 capsule by mouth in the morning.     rosuvastatin (CRESTOR) 20 MG tablet Take 1 tablet (20 mg total) by mouth daily. 90 tablet 3   tretinoin (RETIN-A) 0.1 % cream Apply 1 application  topically at bedtime.     Turmeric Curcumin 500 MG CAPS Take 500 mg by mouth in the morning.     ferrous sulfate 325 (65 FE) MG tablet Take 1 tablet (325 mg total) by mouth daily. (Patient not taking: Reported on 07/06/2022) 30 tablet 3   No current facility-administered medications for this visit.    Allergies as of 07/21/2022 - Review Complete 07/21/2022  Allergen Reaction Noted   Azithromycin Shortness Of Breath and Rash 04/09/2011   Tussionex pennkinetic er [hydrocod poli-chlorphe poli er] Shortness Of Breath and Rash 04/09/2011   Omeprazole Rash 11/13/2021    Family History  Problem Relation Age of Onset   Brain cancer Mother        brain cancer   Emphysema Father    Diabetes Son    Heart disease Paternal Grandfather  Breast cancer Maternal Aunt     Social History   Socioeconomic History   Marital status: Married    Spouse name: Not on file   Number of children: Not on file   Years of education: Not on file   Highest education level: Not on file  Occupational History   Occupation: clerical    Employer: [employer]  Tobacco Use   Smoking status: Former    Packs/day: 1.00    Years: 35.00    Additional pack years: 0.00    Total pack years: 35.00    Types: Cigarettes    Quit date: 03/31/2007    Years since quitting: 15.3    Passive exposure: Past   Smokeless tobacco: Never  Vaping Use   Vaping Use: Never used  Substance and Sexual Activity   Alcohol use: Yes    Comment: occassional about 3 drinks per year   Drug use: No   Sexual activity: Never  Other Topics Concern   Not on file  Social History Narrative   Not on file   Social Determinants of Health   Financial Resource Strain: Not on file  Food Insecurity: Not on file  Transportation Needs: Not on file   Physical Activity: Not on file  Stress: Not on file  Social Connections: Not on file   Review of systems General: negative for malaise, night sweats, fever, chills, weight loss Neck: Negative for lumps, goiter, pain and significant neck swelling Resp: Negative for cough, wheezing, dyspnea at rest CV: Negative for chest pain, leg swelling, palpitations, orthopnea GI: denies melena, hematochezia, nausea, vomiting, diarrhea, constipation, dysphagia, odyonophagia, early satiety or unintentional weight loss. +spitting up/vomiting  MSK: Negative for joint pain or swelling, back pain, and muscle pain. Derm: Negative for itching or rash Psych: Denies depression, anxiety, memory loss, confusion. No homicidal or suicidal ideation.  Heme: Negative for prolonged bleeding, bruising easily, and swollen nodes. Endocrine: Negative for cold or heat intolerance, polyuria, polydipsia and goiter. Neuro: negative for tremor, gait imbalance, syncope and seizures. The remainder of the review of systems is noncontributory.  Physical Exam: BP 96/60 (BP Location: Left Arm, Patient Position: Sitting, Cuff Size: Small)   Pulse 89   Temp 98.7 F (37.1 C) (Oral)   Ht  (1.575 m)   Wt 97 lb 3.2 oz (44.1 kg)   BMI 17.78 kg/m  General:   Alert and oriented. No distress noted. Pleasant and cooperative.  Head:  Normocephalic and atraumatic. Eyes:  Conjuctiva clear without scleral icterus. Mouth:  Oral mucosa pink and moist. Good dentition. No lesions. Heart: Normal rate and rhythm, s1 and s2 heart sounds present.  Lungs: Clear lung sounds in all lobes. Respirations equal and unlabored. Abdomen:  +BS, soft, non-tender and non-distended. No rebound or guarding. No HSM or masses noted. Derm: No palmar erythema or jaundice Msk:  Symmetrical without gross deformities. Normal posture. Extremities:  Without edema. Neurologic:  Alert and  oriented x4 Psych:  Alert and cooperative. Normal mood and affect.  Invalid  input(s): "6 MONTHS"   ASSESSMENT: REY FORS is a 77 y.o. female presenting today for follow up of suspected achalasia and weight loss  Concern for achalasia with episodes of spitting up, this occurs intermittently. She was started on reglan  TID at last visit with perhaps some improvement as spitting up is not occurring everyday, she went almost an entire week last week without any episodes. Her weight is stable today. She has appt for esophageal [facility] at [facility]  in June and an Appt with Dr. Arlie Solomons at [facility] in July for further evaluation of possible achalasia, encouraged to keep these appts. For now we will continue with reglan  TID, she should consume  small meals throughout the day and avoid greasy, spicy, fried foods. Encouraged to implement protein shakes as tolerated 2-3x/day for nutrition maintenance.    PLAN:  Continue reglan  TID  2. Small meals throughout the day, avoid greasy/fried foods 3. Keep manometry appt at [facility]  4. Keep appt at [facility]  5. Protein shakes 2-3x/day for added nutrition   All questions were answered, patient verbalized understanding and is in agreement with plan as outlined above.   Follow Up: End of July   Claudia Ryner L. Jeanmarie Hubert, MSN, APRN, AGNP-C Adult-Gerontology Nurse Practitioner [facility]  I have reviewed the note and agree with the APP's assessment as described in this progress note  Patient presenting with abnormalities suspicious for achalasia, ?concern for paraneoplastic syndrome leading to new esophageal abnormalities and dysmotility as evidenced in GES. Will need to proceed with esophageal manometry as scheduled and POEM evaluation with Dr. Shelbie Ammons, MD Gastroenterology and Hepatology [facility]

## 2022-07-21 NOTE — Patient Instructions (Signed)
Continue with reglan  three times per day Try to avoid greasy, fried, spicy foods, do 5-6 small meals throughout the day and protein shakes 2-3 times a day for added nutrition Please keep appts at [facility] and [place], we will be in touch once we receive the results of the esophageal manometry being done at [facility]  Will plan to have you follow up after your visit at [facility]  It was a pleasure to see you today. I want to create trusting relationships with patients and provide genuine, compassionate, and quality care. I truly value your feedback! please be on the lookout for a survey regarding your visit with me today. I appreciate your input about our visit and your time in completing this!    Deborrah Mabin L. Jeanmarie Hubert, MSN, APRN, AGNP-C Adult-Gerontology Nurse Practitioner [facility] Gastroenterology at [facility]

## 2022-08-03 ENCOUNTER — Ambulatory Visit (HOSPITAL_COMMUNITY)
Admission: RE | Admit: 2022-08-03 | Discharge: 2022-08-03 | Disposition: A | Discharge status: Home / Self Care | Payer: Medicare PPO | Source: Ambulatory Visit | Attending: Internal Medicine | Admitting: Internal Medicine

## 2022-08-03 ENCOUNTER — Inpatient Hospital Stay: Payer: Medicare PPO

## 2022-08-03 ENCOUNTER — Inpatient Hospital Stay: Payer: Medicare PPO | Attending: Nurse Practitioner

## 2022-08-03 DIAGNOSIS — Z5111 Encounter for antineoplastic chemotherapy: Secondary | ICD-10-CM | POA: Diagnosis not present

## 2022-08-03 DIAGNOSIS — Z5189 Encounter for other specified aftercare: Secondary | ICD-10-CM | POA: Insufficient documentation

## 2022-08-03 DIAGNOSIS — Z803 Family history of malignant neoplasm of breast: Secondary | ICD-10-CM | POA: Diagnosis not present

## 2022-08-03 DIAGNOSIS — C3490 Malignant neoplasm of unspecified part of unspecified bronchus or lung: Secondary | ICD-10-CM | POA: Diagnosis not present

## 2022-08-03 DIAGNOSIS — J432 Centrilobular emphysema: Secondary | ICD-10-CM | POA: Diagnosis not present

## 2022-08-03 DIAGNOSIS — C3411 Malignant neoplasm of upper lobe, right bronchus or lung: Secondary | ICD-10-CM | POA: Insufficient documentation

## 2022-08-03 DIAGNOSIS — R634 Abnormal weight loss: Secondary | ICD-10-CM | POA: Insufficient documentation

## 2022-08-03 DIAGNOSIS — Z79899 Other long term (current) drug therapy: Secondary | ICD-10-CM | POA: Insufficient documentation

## 2022-08-03 DIAGNOSIS — K3184 Gastroparesis: Secondary | ICD-10-CM | POA: Diagnosis not present

## 2022-08-03 DIAGNOSIS — Z87891 Personal history of nicotine dependence: Secondary | ICD-10-CM | POA: Insufficient documentation

## 2022-08-03 LAB — COMPREHENSIVE METABOLIC PANEL
ALT: 9 U/L (ref 0–44)
AST: 14 U/L — ABNORMAL LOW (ref 15–41)
Albumin: 3.7 g/dL (ref 3.5–5.0)
Alkaline Phosphatase: 65 U/L (ref 38–126)
Anion gap: 6 (ref 5–15)
BUN: 15 mg/dL (ref 8–23)
CO2: 28 mmol/L (ref 22–32)
Calcium: 9.7 mg/dL (ref 8.9–10.3)
Chloride: 102 mmol/L (ref 98–111)
Creatinine, Ser: 0.79 mg/dL (ref 0.44–1.00)
GFR, Estimated: >60 mL/min (ref >60)
Glucose, Bld: 93 mg/dL (ref 70–99)
Potassium: 3.5 mmol/L (ref 3.5–5.1)
Sodium: 136 mmol/L (ref 135–145)
Total Bilirubin: 1 mg/dL (ref 0.3–1.2)
Total Protein: 7.1 g/dL (ref 6.5–8.1)

## 2022-08-03 LAB — CBC WITH DIFFERENTIAL/PLATELET
Abs Immature Granulocytes: 0.02 10*3/uL (ref 0.00–0.07)
Basophils Absolute: 0 10*3/uL (ref 0.0–0.1)
Basophils Relative: 1 %
Eosinophils Absolute: 0.1 10*3/uL (ref 0.0–0.5)
Eosinophils Relative: 1 %
HCT: 34.4 % — ABNORMAL LOW (ref 36.0–46.0)
Hemoglobin: 11.2 g/dL — ABNORMAL LOW (ref 12.0–15.0)
Immature Granulocytes: 0 %
Lymphocytes Relative: 11 %
Lymphs Abs: 0.6 10*3/uL — ABNORMAL LOW (ref 0.7–4.0)
MCH: 30.7 pg (ref 26.0–34.0)
MCHC: 32.6 g/dL (ref 30.0–36.0)
MCV: 94.2 fL (ref 80.0–100.0)
Monocytes Absolute: 1 10*3/uL (ref 0.1–1.0)
Monocytes Relative: 18 %
Neutro Abs: 3.8 10*3/uL (ref 1.7–7.7)
Neutrophils Relative %: 69 %
Platelets: 128 10*3/uL — ABNORMAL LOW (ref 150–400)
RBC: 3.65 MIL/uL — ABNORMAL LOW (ref 3.87–5.11)
RDW: 15.2 % (ref 11.5–15.5)
WBC: 5.5 10*3/uL (ref 4.0–10.5)
nRBC: 0 % (ref 0.0–0.2)

## 2022-08-03 MED ORDER — IOHEXOL 300 MG/ML  SOLN
75.0000 mL | Freq: Once | INTRAMUSCULAR | Status: AC | PRN
  Administered 2022-08-03: 75 mL via INTRAVENOUS

## 2022-08-05 ENCOUNTER — Inpatient Hospital Stay (HOSPITAL_BASED_OUTPATIENT_CLINIC_OR_DEPARTMENT_OTHER): Payer: Medicare PPO | Admitting: Internal Medicine

## 2022-08-05 ENCOUNTER — Other Ambulatory Visit: Payer: Self-pay

## 2022-08-05 VITALS — BP 141/78 | HR 88 | Temp 97.8°F | Resp 16 | Wt 97.5 lb

## 2022-08-05 DIAGNOSIS — Z5189 Encounter for other specified aftercare: Secondary | ICD-10-CM | POA: Diagnosis not present

## 2022-08-05 DIAGNOSIS — Z87891 Personal history of nicotine dependence: Secondary | ICD-10-CM | POA: Diagnosis not present

## 2022-08-05 DIAGNOSIS — K3184 Gastroparesis: Secondary | ICD-10-CM | POA: Diagnosis not present

## 2022-08-05 DIAGNOSIS — Z5111 Encounter for antineoplastic chemotherapy: Secondary | ICD-10-CM | POA: Diagnosis not present

## 2022-08-05 DIAGNOSIS — C3490 Malignant neoplasm of unspecified part of unspecified bronchus or lung: Secondary | ICD-10-CM

## 2022-08-05 DIAGNOSIS — C3411 Malignant neoplasm of upper lobe, right bronchus or lung: Secondary | ICD-10-CM | POA: Diagnosis not present

## 2022-08-05 DIAGNOSIS — R634 Abnormal weight loss: Secondary | ICD-10-CM | POA: Diagnosis not present

## 2022-08-05 DIAGNOSIS — Z79899 Other long term (current) drug therapy: Secondary | ICD-10-CM | POA: Diagnosis not present

## 2022-08-05 DIAGNOSIS — Z803 Family history of malignant neoplasm of breast: Secondary | ICD-10-CM | POA: Diagnosis not present

## 2022-08-05 NOTE — Progress Notes (Signed)
CT biopsy order and CT Abd/Pelv order placed per Dr. Ellin Saba verbal order

## 2022-08-05 NOTE — Progress Notes (Signed)
[facility] [phone]   [phone]  OFFICE PROGRESS NOTE  Ignatius Specking, MD [address] [place] [address]  PRINCIPAL DIAGNOSIS: recurrent small cell lung cancer initially diagnosed as Limited stage small cell lung cancer in June 2011, with recurrence in June 2015.   PRIOR THERAPY:  Status post 4 cycles of systemic chemotherapy with carboplatin and etoposide. Last dose was given 11/14/2009. This was concurrent with radiotherapy. Status post prophylactic cranial irradiation completed January 28, 2010. Status post wedge resection of the right upper lobe lung nodule under the care of Dr. Laneta Simmers on 09/21/2013 and the final pathology was consistent with small cell lung cancer. systemic chemotherapy with carboplatin for AUC of 5 on day 1 and etoposide 120 mg/M2 on days 1, 2 and 3 with Neulasta support on day 4 every 3 weeks. Status post 4 cycles. First dose on 10/30/2013.  CURRENT THERAPY: Observation.  INTERVAL HISTORY: Claudia [name] 77 y.o. female returns to the clinic today for follow-up visit accompanied by her husband.  The patient continues to lose weight.  She also has some difficulty swallowing and she was supposed to have swallow test at [facility] in few weeks.  She denied having any current chest pain, shortness of breath except with exertion with no cough or hemoptysis.  She has no nausea, vomiting, diarrhea or constipation.  She has no headache or visual changes.  She is here today for evaluation with repeat CT scan of the chest for restaging of her disease.  She had bronchoscopy in late February 2024 that was negative for malignancy.  The patient continued on observation and she is here today for reevaluation with repeat CT scan of the chest for restaging of her disease.  MEDICAL HISTORY: Past Medical History:  Diagnosis Date   Achalasia    Anemia    Arthritis    Bradycardia    mild,may be due to beta blocker therapy   COPD (chronic  obstructive pulmonary disease) (HCC)    Gastroparesis    GERD (gastroesophageal reflux disease)    otc   Hypertension 06/01/2019   pt denies   Local recurrence of lung cancer (HCC) dx'd 07/2013   rt thoracotomy chemo comp 12/2013   Lung cancer (HCC) 03/30/2008   Dr. Arbutus Ped, finished chemo, sp radiation, left upper    Lymphocytic colitis    Pneumonia     ALLERGIES:  is allergic to azithromycin, tussionex pennkinetic er [hydrocod poli-chlorphe poli er], and omeprazole.  MEDICATIONS:  Current Outpatient Medications  Medication Sig Dispense Refill   Calcium Carbonate Antacid (TUMS PO) Take 500-1,000 mg by mouth 3 (three) times daily as needed (indigestion/heartburn.).     Cholecalciferol (VITAMIN D-3) 25 MCG (1000 UT) CAPS Take 1,000 Units by mouth in the morning.     Coenzyme Q10 100 MG TABS Take 100 mg by mouth in the morning.     ferrous sulfate 325 (65 FE) MG tablet Take 1 tablet (325 mg total) by mouth daily. (Patient not taking: Reported on 07/06/2022) 30 tablet 3   metoCLOPramide (REGLAN) 5 MG tablet Take 1 tablet (5 mg total) by mouth 3 (three) times daily before meals. 180 tablet 3   Multiple Vitamin (MULTIVITAMIN WITH MINERALS) TABS tablet Take 1 tablet by mouth in the morning.     Probiotic Product (PROBIOTIC DAILY PO) Take 1 capsule by mouth in the morning.     rosuvastatin (CRESTOR) 20 MG tablet Take 1 tablet (20 mg total) by mouth  daily. 90 tablet 3   tretinoin (RETIN-A) 0.1 % cream Apply 1 application  topically at bedtime.     Turmeric Curcumin 500 MG CAPS Take 500 mg by mouth in the morning.     No current facility-administered medications for this visit.    SURGICAL HISTORY:  Past Surgical History:  Procedure Laterality Date   BRONCHIAL BIOPSY  05/28/2022   Procedure: BRONCHIAL BIOPSIES;  Surgeon: Omar Person, MD;  Location: [facility] ENDOSCOPY;  Service: Pulmonary;;   BRONCHIAL BRUSHINGS  05/28/2022   Procedure: BRONCHIAL BRUSHINGS;  Surgeon: Omar Person, MD;   Location: [facility] ENDOSCOPY;  Service: Pulmonary;;   BRONCHIAL NEEDLE ASPIRATION BIOPSY  05/28/2022   Procedure: BRONCHIAL NEEDLE ASPIRATION BIOPSIES;  Surgeon: Omar Person, MD;  Location: [facility] ENDOSCOPY;  Service: Pulmonary;;   BRONCHIAL WASHINGS  05/28/2022   Procedure: BRONCHIAL WASHINGS;  Surgeon: Omar Person, MD;  Location: [facility] ENDOSCOPY;  Service: Pulmonary;;   BRONCHOSCOPY  2011   DILATION AND CURETTAGE OF UTERUS     ESOPHAGOGASTRODUODENOSCOPY (EGD) WITH PROPOFOL N/A 07/29/2021   Procedure: ESOPHAGOGASTRODUODENOSCOPY (EGD) WITH PROPOFOL;  Surgeon: Dolores Frame, MD;  Location: [facility] ENDO SUITE;  Service: Gastroenterology;  Laterality: N/A;  1235 ASA 2   HEMOSTASIS CONTROL  05/28/2022   Procedure: HEMOSTASIS CONTROL;  Surgeon: Omar Person, MD;  Location: [facility] ENDOSCOPY;  Service: Pulmonary;;   NECK SURGERY  1980's   THORACOTOMY Right 09/21/2013   Procedure: THORACOTOMY MAJOR;  Surgeon: Alleen Borne, MD;  Location: [facility] OR;  Service: Thoracic;  Laterality: Right;   UTERINE FIBROID SURGERY  2012   WEDGE RESECTION Right 09/21/2013   Procedure: RIGHT UPPER LOBE WEDGE RESECTION;  Surgeon: Alleen Borne, MD;  Location: [facility] OR;  Service: Thoracic;  Laterality: Right;    REVIEW OF SYSTEMS:  Constitutional: positive for fatigue and weight loss Eyes: negative Ears, nose, mouth, throat, and face: negative Respiratory: negative Cardiovascular: negative Gastrointestinal: positive for dysphagia and nausea Genitourinary:negative Integument/breast: negative Hematologic/lymphatic: negative Musculoskeletal:negative Neurological: negative Behavioral/Psych: negative Endocrine: negative Allergic/Immunologic: negative   PHYSICAL EXAMINATION: General appearance: alert, cooperative, fatigued, and no distress Head: Normocephalic, without obvious abnormality, atraumatic Neck: no adenopathy Lymph nodes: Cervical, supraclavicular, and axillary nodes normal. Resp: clear to auscultation  bilaterally Back: symmetric, no curvature. ROM normal. No CVA tenderness. Cardio: regular rate and rhythm, S1, S2 normal, no murmur, click, rub or gallop GI: soft, non-tender; bowel sounds normal; no masses,  no organomegaly Extremities: extremities normal, atraumatic, no cyanosis or edema Neurologic: Alert and oriented X 3, normal strength and tone. Normal symmetric reflexes. Normal coordination and gait  ECOG PERFORMANCE STATUS: 1 - Symptomatic but completely ambulatory  Blood pressure (!) 141/78, pulse 88, temperature 97.8 F (36.6 C), temperature source Oral, resp. rate 16, weight 97 lb 8 oz (44.2 kg), SpO2 98 %.  LABORATORY DATA: Lab Results  Component Value Date   WBC 5.5 08/03/2022   HGB 11.2 (L) 08/03/2022   HCT 34.4 (L) 08/03/2022   MCV 94.2 08/03/2022   PLT 128 (L) 08/03/2022      Chemistry      Component Value Date/Time   NA 136 08/03/2022 0916   NA 141 11/02/2016 0919   K 3.5 08/03/2022 0916   K 4.2 11/02/2016 0919   CL 102 08/03/2022 0916   CL 103 08/03/2012 0842   CO2 28 08/03/2022 0916   CO2 29 11/02/2016 0919   BUN 15 08/03/2022 0916   BUN 13.9 11/02/2016 0919   CREATININE 0.79 08/03/2022 0916  CREATININE 0.75 05/25/2022 1417   CREATININE 1.0 11/02/2016 0919      Component Value Date/Time   CALCIUM 9.7 08/03/2022 0916   CALCIUM 10.9 (H) 11/02/2016 0919   ALKPHOS 65 08/03/2022 0916   ALKPHOS 78 11/02/2016 0919   AST 14 (L) 08/03/2022 0916   AST 13 (L) 05/25/2022 1417   AST 19 11/02/2016 0919   ALT 9 08/03/2022 0916   ALT 6 05/25/2022 1417   ALT 12 11/02/2016 0919   BILITOT 1.0 08/03/2022 0916   BILITOT 0.5 05/25/2022 1417   BILITOT 0.47 11/02/2016 0919       RADIOGRAPHIC STUDIES: CT Chest W Contrast  Result Date: 08/05/2022 CLINICAL DATA:  Small cell lung cancer.  * Tracking Code: BO * EXAM: CT CHEST WITH CONTRAST TECHNIQUE: Multidetector CT imaging of the chest was performed during intravenous contrast administration. RADIATION DOSE  REDUCTION: This exam was performed according to the departmental dose-optimization program which includes automated exposure control, adjustment of the mA and/or kV according to patient size and/or use of iterative reconstruction technique. CONTRAST:  75mL OMNIPAQUE IOHEXOL 300 MG/ML  SOLN COMPARISON:  05/25/2022 and PET 04/23/2022. FINDINGS: Cardiovascular: Atherosclerotic calcification of the aorta. Heart size normal. No pericardial effusion. Mediastinum/Nodes: No pathologically enlarged mediastinal, right hilar or axillary lymph nodes. Left hilum is difficult to evaluate due to collapse/consolidation. Esophagus is dilated and contains food debris throughout. Lungs/Pleura: Centrilobular emphysema. Postoperative scarring along the minor fissure. 4 mm apical segment right upper lobe nodule (4/37), new. Post radiation collapse/consolidation the medial upper left hemithorax, with note of associated abnormal hypermetabolism in anterior left upper lobe on 04/23/2022. New nodule in the superior segment left lower lobe measures 8 mm (7 x 8 mm, 4/61). There are additional new nodular densities in the inferior left lower lobe measuring up to 4 mm (4/127). No pleural fluid. Airway is otherwise unremarkable. Upper Abdomen: Faint 7 mm hypodense lesion in the left hepatic lobe (2/142). Comparison with 05/25/2022 is difficult due to lack of IV contrast on that study. Visualized portions of the liver, gallbladder and adrenal glands are otherwise unremarkable. Hyperdense lesion off the upper pole right kidney and low-attenuation lesions in the left kidney. No specific follow-up necessary. Visualized portions of the spleen, pancreas, stomach and bowel are grossly unremarkable. Periportal adenopathy measures up to 2.3 x 2.6 cm (2/157), partially imaged. Musculoskeletal: Degenerative changes in the spine. Minimal retrolisthesis of L1 on L2. Peripherally sclerotic lucent lesion in the T10 vertebral body (7/83), similar. No new  lesions. IMPRESSION: 1. Post radiation scarring in the medial left upper lobe with abnormal hypermetabolism seen in the anterior left upper lobe on 04/23/2022, compatible with residual/recurrent disease. CT appearance is similar. 2. Scattered new nodular densities in the lungs bilaterally, largest in the left lower lobe, compatible with progressive metastatic disease. 3. New subcentimeter hypodense lesion in the left hepatic lobe with bulky periportal adenopathy, indicative of new metastatic disease. 4. Patulous debris-filled esophagus, possibly due to achalasia. 5.  Aortic atherosclerosis (ICD10-I70.0). 6. Emphysema (ICD10-J43.9). Electronically Signed   By: Leanna Battles M.D.   On: 08/05/2022 13:42    ASSESSMENT AND PLAN:  This is a very pleasant 77 years old white female with recurrent small cell lung cancer that was initially diagnosed in June 2011. She underwent systemic chemotherapy was carboplatin and etoposide concurrent with radiation and followed by prophylactic cranial irradiation. The patient was on observation since November 2011 that she had recurrence with right upper lobe nodule in June 2015 status post wedge resection that  was consistent with recurrent small cell lung cancer. She was then treated with systemic chemotherapy again with carboplatin and etoposide for 4 cycles. She is currently on observe patient and feeling fine with no concerning complaints except for the delayed gastric emptying.  She is followed by gastroenterology. Her CT scan of the chest showed some changes in the collapsed left upper lobe concerning for disease recurrence with scattered small nodules throughout the left lung that has either increased or new from the previous imaging studies.  This was followed by a PET scan that showed extensive recurrent tumor in the left lung with hypermetabolic left lung nodules consistent with metastatic disease but no mediastinal or hilar adenopathy. The patient underwent  bronchoscopy with biopsy of the suspicious area in the left lung but there was no evidence for malignancy. She has been on observation and the patient had a recent CT scan of the chest performed on 08/03/2022.  I personally and independently reviewed the scan images and discussed the result with the patient today. Her scan showed highly suspicious recurrent disease with scattered new nodular density in the lungs bilaterally as well as new subcentimeter hypodense lesion in the left hepatic lobe and bulky periportal adenopathy indicative of new metastatic disease. I strongly recommend for the patient to consider systemic chemotherapy with a likely regimen of small cell lung cancer with carboplatin, etoposide and Imfinzi or Tecentriq. I discussed with the patient the adverse effect of this treatment including but not limited to alopecia, myelosuppression, nausea and vomiting, peripheral neuropathy, liver or renal dysfunction as well as immunotherapy adverse effects. The patient is interested in treatment but she would like to take it close to home in [place]. I will refer her to Dr. Ellin Saba and I personally spoke to him about her and he kindly agreed to see her soon start her treatment closer to home.  She may need full staging workup with repeat CT scan of the abdomen pelvis as well as MRI of the brain. The patient was advised to call if she has any other concerning issues in the interval. All questions were answered. The patient knows to call the clinic with any problems, questions or concerns. We can certainly see the patient much sooner if necessary.  Disclaimer: This note was dictated with voice recognition software. Similar sounding words can inadvertently be transcribed and may not be corrected upon review.

## 2022-08-05 NOTE — Progress Notes (Signed)
[facility] [address], [place] [address]   Clinic Day:  08/06/2022  Referring physician: Ignatius Specking, MD  Patient Care Team: Ignatius Specking, MD as PCP - General (Internal Medicine) Pricilla Riffle, MD as PCP - Cardiology (Cardiology) Charna Elizabeth, MD as Consulting Physician (Gastroenterology) Doreatha Massed, MD as Medical Oncologist (Medical Oncology) Therese Sarah, RN as Oncology Nurse Navigator (Medical Oncology)   ASSESSMENT & PLAN:   Assessment:  1.  Recurrent extensive stage small cell lung cancer: - Patient seen at the request of Dr. Shirline Frees to get care closer to home. - 4 cycles of carboplatin and etoposide with concurrent radiation therapy for limited stage small cell lung cancer on 11/14/2009 - Status post PCI completed on 01/28/2010 - Wedge resection of the RUL by Dr. Lavinia Sharps on 09/20/2013, pathology consistent with small cell lung cancer. - 4 cycles of carboplatin and etoposide from 10/30/2013 through 01/08/2014 - PET scan on 04/23/2012: Marked hypermetabolism in the medial and anterior aspect of the left upper lobe.  Several hypermetabolic lung nodules in the left lung.  9 mm lesion in the left lower lobe.  No metastatic disease in the AP. - Bronchoscopy and FNA of the LUL nodule and LUL brushing and lavage with no malignant cells. - CT chest (08/03/2022): Scattered new nodular densities in the lungs bilaterally, largest in the left lower lobe.  New subcentimeter hypodense lesion in the left hepatic lobe 7 mm.  Periportal adenopathy 2.3 x 2.6 cm.  2.  Social/family history: - She moved back to [place] recently and lives with her husband.  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  She also worked at [employer] for 15 years.  Quit smoking in 2010 and smoked 1 pack/day for 30 years. - Mother had brain tumor.  Maternal aunt had breast cancer.  Plan:  1.  Recurrent extensive stage small cell lung cancer: - We discussed  and reviewed images of the CT scan of the chest.  Since the new recurrence is in the same area of previous recurrence, I did not recommend rebiopsy to avoid complications. - I have discussed about extensive stage of her malignancy and treatment intent in the palliative setting. - As she had a long chemotherapy free interval, I agree that we can use platinum based regimen.  We will add immunotherapy with Imfinzi. - Recommend restaging scans with PET scan and brain MRI. - Recommend port placement by IR.  2.  Gastroparesis: - She has lost 30 pounds in the last 1 year with vomiting.  She eats 4-5 small meals per day.  She follows up with Dr. Levon Hedger.   Orders Placed This Encounter  Procedures   IR IMAGING GUIDED PORT INSERTION    Standing Status:   Future    Standing Expiration Date:   08/06/2023    Order Specific Question:   Reason for Exam (SYMPTOM  OR DIAGNOSIS REQUIRED)    Answer:   SCLC, chemotherapy administration    Order Specific Question:   Preferred Imaging Location?    Answer:   [facility]    Order Specific Question:   Release to patient    Answer:   Immediate   NM PET Image Restag (PS) Skull Base To Thigh    Standing Status:   Future    Standing Expiration Date:   08/06/2023    Order Specific Question:   If indicated for the ordered procedure, I authorize the administration  of a radiopharmaceutical per Radiology protocol    Answer:   Yes    Order Specific Question:   Preferred imaging location?    Answer:   [facility]    Order Specific Question:   Release to patient    Answer:   Immediate   MR Brain W Wo Contrast    Standing Status:   Future    Standing Expiration Date:   08/06/2023    Order Specific Question:   If indicated for the ordered procedure, I authorize the administration of contrast media per Radiology protocol    Answer:   Yes    Order Specific Question:   What is the patient's sedation requirement?    Answer:   No Sedation    Order Specific Question:    Does the patient have a pacemaker or implanted devices?    Answer:   No    Order Specific Question:   Use SRS Protocol?    Answer:   No    Order Specific Question:   Preferred imaging location?    Answer:   [facility] (table limit [phone])    Order Specific Question:   Release to patient    Answer:   Immediate      I,Katie Daubenspeck,acting as a scribe for Doreatha Massed, MD.,have documented all relevant documentation on the behalf of Doreatha Massed, MD,as directed by  Doreatha Massed, MD while in the presence of Doreatha Massed, MD.   I, Doreatha Massed MD, have reviewed the above documentation for accuracy and completeness, and I agree with the above.   Doreatha Massed, MD   5/9/20245:23 PM  CHIEF COMPLAINT/PURPOSE OF CONSULT:   Diagnosis: recurrent small cell lung cancer    Cancer Staging  Small cell lung cancer (HCC) Staging form: Lung, AJCC 7th Edition - Clinical: Limited Stage Small cell Lung Cancer - Signed by Si Gaul, MD on 05/10/2013    Prior Therapy: 1. Concurrent chemoradiation with carboplatin and etoposide, completed 11/14/2009.  2. Prophylactic cranial irradiation, completed 01/28/2010. 3. RUL wedge resection on 09/21/2013 (Dr. Laneta Simmers) 4. 4 cycles of carboplatin and etoposide, 10/30/2013 - 01/10/2014  Current Therapy: Carboplatin, etoposide and durvalumab.   HISTORY OF PRESENT ILLNESS:   Oncology History   No history exists.      Claudia [name] is a 77 y.o. female presenting to clinic today for evaluation of recurrent small cell lung cancer at the request of Dr. Arbutus Ped.  In summary, she was initially diagnosed with limited stage small cell lung cancer in 08/2009. She was treated with concurrent chemoradiation with carboplatin and etoposide, completed 11/14/09. She also received prophylactic whole brain radiation through 01/28/10. She was placed on observation but was found to have a recurrence in the RUL in 08/2013. She  underwent wedge resection on 09/21/13 and was treated with 4 cycles of adjuvant carboplatin and etoposide.  This year, she underwent surveillance chest CT on 04/06/22 showing concern for disease recurrence or early metastasis in LUL. Further evaluation with PET scan on 04/23/22 showed: extensive recurrent tumor in left lung; hypermetabolic left lung nodules consistent with metastatic disease; no adenopathy or metastatic disease involving abdomen/pelvis or bony structures. She was taken for bronchoscopy on 05/28/22 under Dr. Thora Lance. All three LUL specimens (FNA, brushing, and lavage) were negative for malignancy.  Most recently on 08/03/22, she underwent restaging chest CT showing: similar CT appearance of LUL, which remains compatible with residual/recurrent disease; new scattered nodular densities in lungs bilaterally, largest in LLL, compatible with progressive metastatic disease;  new subcentimeter hypodense lesion in left hepatic lobe with bulky periportal adenopathy, indicative of new metastatic disease. She was seen by Dr. Arbutus Ped yesterday, 08/05/22, and discussed the results of her scan with him.  Today, she states that she is doing well overall. Her appetite level is at 100%. Her energy level is at 100%.  PAST MEDICAL HISTORY:   Past Medical History: Past Medical History:  Diagnosis Date   Achalasia    Anemia    Arthritis    Bradycardia    mild,may be due to beta blocker therapy   COPD (chronic obstructive pulmonary disease) (HCC)    Gastroparesis    GERD (gastroesophageal reflux disease)    otc   Hypertension 06/01/2019   pt denies   Local recurrence of lung cancer (HCC) dx'd 07/2013   rt thoracotomy chemo comp 12/2013   Lung cancer (HCC) 03/30/2008   Dr. Arbutus Ped, finished chemo, sp radiation, left upper    Lymphocytic colitis    Pneumonia     Surgical History: Past Surgical History:  Procedure Laterality Date   BRONCHIAL BIOPSY  05/28/2022   Procedure: BRONCHIAL BIOPSIES;  Surgeon:  Omar Person, MD;  Location: [facility] ENDOSCOPY;  Service: Pulmonary;;   BRONCHIAL BRUSHINGS  05/28/2022   Procedure: BRONCHIAL BRUSHINGS;  Surgeon: Omar Person, MD;  Location: [facility] ENDOSCOPY;  Service: Pulmonary;;   BRONCHIAL NEEDLE ASPIRATION BIOPSY  05/28/2022   Procedure: BRONCHIAL NEEDLE ASPIRATION BIOPSIES;  Surgeon: Omar Person, MD;  Location: [facility] ENDOSCOPY;  Service: Pulmonary;;   BRONCHIAL WASHINGS  05/28/2022   Procedure: BRONCHIAL WASHINGS;  Surgeon: Omar Person, MD;  Location[facility] ENDOSCOPY;  Service: Pulmonary;;   BRONCHOSCOPY  2011   DILATION AND CURETTAGE OF UTERUS     ESOPHAGOGASTRODUODENOSCOPY (EGD) WITH PROPOFOL N/A 07/29/2021   Procedure: ESOPHAGOGASTRODUODENOSCOPY (EGD) WITH PROPOFOL;  Surgeon: Dolores Frame, MD;  Location: [facility] ENDO SUITE;  Service: Gastroenterology;  Laterality: N/A;  1235 ASA 2   HEMOSTASIS CONTROL  05/28/2022   Procedure: HEMOSTASIS CONTROL;  Surgeon: Omar Person, MD;  Location: [facility] ENDOSCOPY;  Service: Pulmonary;;   NECK SURGERY  1980's   THORACOTOMY Right 09/21/2013   Procedure: THORACOTOMY MAJOR;  Surgeon: Alleen Borne, MD;  Location: [facility] OR;  Service: Thoracic;  Laterality: Right;   UTERINE FIBROID SURGERY  2012   WEDGE RESECTION Right 09/21/2013   Procedure: RIGHT UPPER LOBE WEDGE RESECTION;  Surgeon: Alleen Borne, MD;  Location: [facility] OR;  Service: Thoracic;  Laterality: Right;    Social History: Social History   Socioeconomic History   Marital status: Married    Spouse name: Not on file   Number of children: Not on file   Years of education: Not on file   Highest education level: Not on file  Occupational History   Occupation: clerical    Employer: [employer]  Tobacco Use   Smoking status: Former    Packs/day: 1.00    Years: 35.00    Additional pack years: 0.00    Total pack years: 35.00    Types: Cigarettes    Quit date: 03/31/2007    Years since quitting: 15.3    Passive exposure: Past   Smokeless  tobacco: Never  Vaping Use   Vaping Use: Never used  Substance and Sexual Activity   Alcohol use: Yes    Comment: occassional about 3 drinks per year   Drug use: No   Sexual activity: Never  Other Topics Concern   Not on file  Social History  Narrative   Not on file   Social Determinants of Health   Financial Resource Strain: Not on file  Food Insecurity: No Food Insecurity (08/06/2022)   Hunger Vital Sign    Worried About Running Out of Food in the Last Year: Never true    Ran Out of Food in the Last Year: Never true  Transportation Needs: No Transportation Needs (08/06/2022)   PRAPARE - Administrator, Civil Service (Medical): No    Lack of Transportation (Non-Medical): No  Physical Activity: Not on file  Stress: Not on file  Social Connections: Not on file  Intimate Partner Violence: Not At Risk (08/06/2022)   Humiliation, Afraid, Rape, and Kick questionnaire    Fear of Current or Ex-Partner: No    Emotionally Abused: No    Physically Abused: No    Sexually Abused: No    Family History: Family History  Problem Relation Age of Onset   Brain cancer Mother        brain cancer   Emphysema Father    Diabetes Son    Heart disease Paternal Grandfather    Breast cancer Maternal Aunt     Current Medications:  Current Outpatient Medications:    Calcium Carbonate Antacid (TUMS PO), Take 500-1,000 mg by mouth 3 (three) times daily as needed (indigestion/heartburn.)., Disp: , Rfl:    Cholecalciferol (VITAMIN D-3) 25 MCG (1000 UT) CAPS, Take 1,000 Units by mouth in the morning., Disp: , Rfl:    Coenzyme Q10 100 MG TABS, Take 100 mg by mouth in the morning., Disp: , Rfl:    ferrous sulfate 325 (65 FE) MG tablet, Take 1 tablet (325 mg total) by mouth daily., Disp: 30 tablet, Rfl: 3   lidocaine-prilocaine (EMLA) cream, Apply 1 Application topically as needed., Disp: 30 g, Rfl: 1   metoCLOPramide (REGLAN) 5 MG tablet, Take 1 tablet (5 mg total) by mouth 3 (three) times  daily before meals., Disp: 180 tablet, Rfl: 3   Multiple Vitamin (MULTIVITAMIN WITH MINERALS) TABS tablet, Take 1 tablet by mouth in the morning., Disp: , Rfl:    Probiotic Product (PROBIOTIC DAILY PO), Take 1 capsule by mouth in the morning., Disp: , Rfl:    prochlorperazine (COMPAZINE) 10 MG tablet, Take 1 tablet (10 mg total) by mouth every 6 (six) hours as needed for nausea or vomiting., Disp: 30 tablet, Rfl: 3   rosuvastatin (CRESTOR) 20 MG tablet, Take 1 tablet (20 mg total) by mouth daily., Disp: 90 tablet, Rfl: 3   tretinoin (RETIN-A) 0.1 % cream, Apply 1 application  topically at bedtime., Disp: , Rfl:    Turmeric Curcumin 500 MG CAPS, Take 500 mg by mouth in the morning., Disp: , Rfl:    Allergies: Allergies  Allergen Reactions   Azithromycin Shortness Of Breath and Rash    Took Tussionex at same time Jan 2013.   Tussionex Pennkinetic Er [Hydrocod Poli-Chlorphe Poli Er] Shortness Of Breath and Rash    Took Azithromycin at same time Jan 2013   Omeprazole Rash    REVIEW OF SYSTEMS:   Review of Systems  Constitutional:  Negative for chills, fatigue and fever.  HENT:   Negative for lump/mass, mouth sores, nosebleeds, sore throat and trouble swallowing.   Eyes:  Negative for eye problems.  Respiratory:  Negative for cough and shortness of breath.   Cardiovascular:  Negative for chest pain, leg swelling and palpitations.  Gastrointestinal:  Positive for constipation, nausea and vomiting. Negative for abdominal pain and diarrhea.  Genitourinary:  Negative for bladder incontinence, difficulty urinating, dysuria, frequency, hematuria and nocturia.   Musculoskeletal:  Negative for arthralgias, back pain, flank pain, myalgias and neck pain.  Skin:  Negative for itching and rash.  Neurological:  Negative for dizziness, headaches and numbness.  Hematological:  Does not bruise/bleed easily.  Psychiatric/Behavioral:  Positive for sleep disturbance. Negative for depression and suicidal  ideas. The patient is not nervous/anxious.   All other systems reviewed and are negative.    VITALS:   Blood pressure (!) 156/65, pulse 84, temperature 97.6 F (36.4 C), temperature source Oral, resp. rate 16, weight 97 lb 14.4 oz (44.4 kg), SpO2 99 %.  Wt Readings from Last 3 Encounters:  08/06/22 97 lb 14.4 oz (44.4 kg)  08/05/22 97 lb 8 oz (44.2 kg)  07/21/22 97 lb 3.2 oz (44.1 kg)    Body mass index is 17.91 kg/m.  Performance status (ECOG): 1 - Symptomatic but completely ambulatory  PHYSICAL EXAM:   Physical Exam Vitals and nursing note reviewed. Exam conducted with a chaperone present.  Constitutional:      Appearance: Normal appearance.  Cardiovascular:     Rate and Rhythm: Normal rate and regular rhythm.     Pulses: Normal pulses.     Heart sounds: Normal heart sounds.  Pulmonary:     Effort: Pulmonary effort is normal.     Breath sounds: Normal breath sounds.  Abdominal:     Palpations: Abdomen is soft. There is no hepatomegaly, splenomegaly or mass.     Tenderness: There is no abdominal tenderness.  Musculoskeletal:     Right lower leg: No edema.     Left lower leg: No edema.  Lymphadenopathy:     Cervical: No cervical adenopathy.     Right cervical: No superficial, deep or posterior cervical adenopathy.    Left cervical: No superficial, deep or posterior cervical adenopathy.     Upper Body:     Right upper body: No supraclavicular or axillary adenopathy.     Left upper body: No supraclavicular or axillary adenopathy.  Neurological:     General: No focal deficit present.     Mental Status: She is alert and oriented to person, place, and time.  Psychiatric:        Mood and Affect: Mood normal.        Behavior: Behavior normal.     LABS:      Latest Ref Rng & Units 08/03/2022    9:17 AM 05/25/2022    2:17 PM 04/30/2022   10:51 AM  CBC  WBC 4.0 - 10.5 K/uL 5.5  5.7  3.6   Hemoglobin 12.0 - 15.0 g/dL 16.1  09.6  04.5   Hematocrit 36.0 - 46.0 % 34.4   33.5  33.8   Platelets 150 - 400 K/uL 128  139  134       Latest Ref Rng & Units 08/03/2022    9:16 AM 05/25/2022    2:17 PM 04/30/2022   10:51 AM  CMP  Glucose 70 - 99 mg/dL 93  82    BUN 8 - 23 mg/dL 15  11    Creatinine 4.09 - 1.00 mg/dL 8.11  9.14    Sodium 782 - 145 mmol/L 136  140    Potassium 3.5 - 5.1 mmol/L 3.5  3.9    Chloride 98 - 111 mmol/L 102  103    CO2 22 - 32 mmol/L 28  31    Calcium 8.9 - 10.3 mg/dL 9.7  9.7    Total Protein 6.5 - 8.1 g/dL 7.1  7.2  6.9   Total Bilirubin 0.3 - 1.2 mg/dL 1.0  0.5  0.7   Alkaline Phos 38 - 126 U/L 65  80  70   AST 15 - 41 U/L 14  13  16    ALT 0 - 44 U/L 9  6  10       No results found for: "CEA1", "CEA" / No results found for: "CEA1", "CEA" No results found for: "PSA1" No results found for: "WGN562" No results found for: "CAN125"  No results found for: "TOTALPROTELP", "ALBUMINELP", "A1GS", "A2GS", "BETS", "BETA2SER", "GAMS", "MSPIKE", "SPEI" Lab Results  Component Value Date   TIBC 149 (L) 10/08/2021   TIBC 159 (L) 04/10/2011   FERRITIN 675 (H) 10/08/2021   FERRITIN 1,680 (H) 04/10/2011   IRONPCTSAT 9 (L) 10/08/2021   IRONPCTSAT 18 (L) 04/10/2011   No results found for: "LDH"   STUDIES:   CT Chest W Contrast  Result Date: 08/05/2022 CLINICAL DATA:  Small cell lung cancer.  * Tracking Code: BO * EXAM: CT CHEST WITH CONTRAST TECHNIQUE: Multidetector CT imaging of the chest was performed during intravenous contrast administration. RADIATION DOSE REDUCTION: This exam was performed according to the departmental dose-optimization program which includes automated exposure control, adjustment of the mA and/or kV according to patient size and/or use of iterative reconstruction technique. CONTRAST:  75mL OMNIPAQUE IOHEXOL 300 MG/ML  SOLN COMPARISON:  05/25/2022 and PET 04/23/2022. FINDINGS: Cardiovascular: Atherosclerotic calcification of the aorta. Heart size normal. No pericardial effusion. Mediastinum/Nodes: No pathologically enlarged  mediastinal, right hilar or axillary lymph nodes. Left hilum is difficult to evaluate due to collapse/consolidation. Esophagus is dilated and contains food debris throughout. Lungs/Pleura: Centrilobular emphysema. Postoperative scarring along the minor fissure. 4 mm apical segment right upper lobe nodule (4/37), new. Post radiation collapse/consolidation the medial upper left hemithorax, with note of associated abnormal hypermetabolism in anterior left upper lobe on 04/23/2022. New nodule in the superior segment left lower lobe measures 8 mm (7 x 8 mm, 4/61). There are additional new nodular densities in the inferior left lower lobe measuring up to 4 mm (4/127). No pleural fluid. Airway is otherwise unremarkable. Upper Abdomen: Faint 7 mm hypodense lesion in the left hepatic lobe (2/142). Comparison with 05/25/2022 is difficult due to lack of IV contrast on that study. Visualized portions of the liver, gallbladder and adrenal glands are otherwise unremarkable. Hyperdense lesion off the upper pole right kidney and low-attenuation lesions in the left kidney. No specific follow-up necessary. Visualized portions of the spleen, pancreas, stomach and bowel are grossly unremarkable. Periportal adenopathy measures up to 2.3 x 2.6 cm (2/157), partially imaged. Musculoskeletal: Degenerative changes in the spine. Minimal retrolisthesis of L1 on L2. Peripherally sclerotic lucent lesion in the T10 vertebral body (7/83), similar. No new lesions. IMPRESSION: 1. Post radiation scarring in the medial left upper lobe with abnormal hypermetabolism seen in the anterior left upper lobe on 04/23/2022, compatible with residual/recurrent disease. CT appearance is similar. 2. Scattered new nodular densities in the lungs bilaterally, largest in the left lower lobe, compatible with progressive metastatic disease. 3. New subcentimeter hypodense lesion in the left hepatic lobe with bulky periportal adenopathy, indicative of new metastatic  disease. 4. Patulous debris-filled esophagus, possibly due to achalasia. 5.  Aortic atherosclerosis (ICD10-I70.0). 6. Emphysema (ICD10-J43.9). Electronically Signed   By: Leanna Battles M.D.   On: 08/05/2022 13:42

## 2022-08-06 ENCOUNTER — Inpatient Hospital Stay (HOSPITAL_BASED_OUTPATIENT_CLINIC_OR_DEPARTMENT_OTHER): Payer: Medicare PPO | Admitting: Hematology

## 2022-08-06 ENCOUNTER — Encounter: Payer: Self-pay | Admitting: Hematology

## 2022-08-06 VITALS — BP 152/60 | HR 84 | Temp 97.6°F | Resp 16 | Wt 97.9 lb

## 2022-08-06 DIAGNOSIS — Z87891 Personal history of nicotine dependence: Secondary | ICD-10-CM | POA: Diagnosis not present

## 2022-08-06 DIAGNOSIS — C3490 Malignant neoplasm of unspecified part of unspecified bronchus or lung: Secondary | ICD-10-CM

## 2022-08-06 DIAGNOSIS — C3411 Malignant neoplasm of upper lobe, right bronchus or lung: Secondary | ICD-10-CM | POA: Diagnosis not present

## 2022-08-06 DIAGNOSIS — Z79899 Other long term (current) drug therapy: Secondary | ICD-10-CM | POA: Diagnosis not present

## 2022-08-06 DIAGNOSIS — K3184 Gastroparesis: Secondary | ICD-10-CM | POA: Diagnosis not present

## 2022-08-06 DIAGNOSIS — Z803 Family history of malignant neoplasm of breast: Secondary | ICD-10-CM | POA: Diagnosis not present

## 2022-08-06 DIAGNOSIS — Z5111 Encounter for antineoplastic chemotherapy: Secondary | ICD-10-CM | POA: Diagnosis not present

## 2022-08-06 DIAGNOSIS — R634 Abnormal weight loss: Secondary | ICD-10-CM | POA: Diagnosis not present

## 2022-08-06 DIAGNOSIS — Z5189 Encounter for other specified aftercare: Secondary | ICD-10-CM | POA: Diagnosis not present

## 2022-08-06 MED ORDER — LIDOCAINE-PRILOCAINE 2.5-2.5 % EX CREA: 1.0000 | TOPICAL_CREAM | CUTANEOUS | 1 refills | Status: DC | PRN

## 2022-08-06 MED ORDER — PROCHLORPERAZINE MALEATE 10 MG PO TABS: 10.0000 mg | ORAL_TABLET | Freq: Four times a day (QID) | ORAL | 3 refills | Status: DC | PRN

## 2022-08-06 NOTE — Progress Notes (Signed)
Berdine Dance, MD  Jacklynn Ganong, MD Dr Kirtland Bouchard, left liver lesion is only 7mm by the recent Chest CT.  Not a good target.  Rec GI referral for EUS of the large periportal met adenopathy for better yield.  TShick

## 2022-08-06 NOTE — Patient Instructions (Addendum)
[facility]  Discharge Instructions  You were seen and examined today by Dr. Ellin Saba. Dr. Ellin Saba is a medical oncologist, meaning that he specializes in the treatment of cancer diagnoses. Dr. Ellin Saba discussed your past medical history, family history of cancers, and the events that led to you being here today.  You were referred to Dr. Ellin Saba due to what appears to be a cancer recurrence on your left lung. It looks like there is a tiny spot on your liver and lymph nodes. It appears to be an extensive stage lung cancer.  Dr. Ellin Saba has recommended repeat staging scans, including a brain MRI and PET scan.  You will require treatment again, Dr. Ellin Saba has recommended getting a Port-A-Cath placed.  Dr. Ellin Saba will restart you on carboplatin and etoposide, which you have previously received. In addition, Dr. Ellin Saba will give you immunotherapy known as Imfinzi. These are given 3 days in a row every 21 days here in the Cancer Center.  Follow-up as scheduled.  Thank you for choosing [facility] to provide your oncology and hematology care.   To afford each patient quality time with our provider, please arrive at least 15 minutes before your scheduled appointment time. You may need to reschedule your appointment if you arrive late (10 or more minutes). Arriving late affects you and other patients whose appointments are after yours.  Also, if you miss three or more appointments without notifying the office, you may be dismissed from the clinic at the provider's discretion.    Again, thank you for choosing [facility].  Our hope is that these requests will decrease the amount of time that you wait before being seen by our physicians.   If you have a lab appointment with the Cancer Center - please note that after April 8th, all labs will be drawn in the cancer center.  You do not have to check in or register  with the main entrance as you have in the past but will complete your check-in at the cancer center.            _____________________________________________________________  Should you have questions after your visit to [facility], please contact our office at [phone] and follow the prompts.  Our office hours are 8:00 a.m. to 4:30 p.m. Monday - Thursday and 8:00 a.m. to 2:30 p.m. Friday.  Please note that voicemails left after 4:00 p.m. may not be returned until the following business day.  We are closed weekends and all major holidays.  You do have access to a nurse 24-7, just call the main number to the clinic [phone] and do not press any options, hold on the line and a nurse will answer the phone.    For prescription refill requests, have your pharmacy contact our office and allow 72 hours.    Masks are no longer required in the cancer centers. If you would like for your care team to wear a mask while they are taking care of you, please let them know. You may have one support person who is at least 77 years old accompany you for your appointments.

## 2022-08-06 NOTE — Progress Notes (Signed)
START ON PATHWAY REGIMEN - Small Cell Lung     Cycles 1 through 4: A cycle is every 21 days:     Durvalumab      Carboplatin      Etoposide    Cycles 5 and beyond: A cycle is every 28 days:     Durvalumab   **Always confirm dose/schedule in your pharmacy ordering system**  Patient Characteristics: Relapsed or Progressive Disease, Second Line, Relapse > 6 Months Therapeutic Status: Relapsed or Progressive Disease Line of Therapy: Second Line Time to Relapse: Relapse > 6 Months Intent of Therapy: Non-Curative / Palliative Intent, Discussed with Patient

## 2022-08-07 ENCOUNTER — Other Ambulatory Visit: Payer: Self-pay

## 2022-08-07 ENCOUNTER — Telehealth: Payer: Medicare PPO | Admitting: Student

## 2022-08-11 ENCOUNTER — Other Ambulatory Visit: Payer: Self-pay | Admitting: Radiology

## 2022-08-11 NOTE — Consult Note (Signed)
Chief Complaint: Patient was seen in consultation today for port a cath placement   Referring Physician(s): Katragadda,Sreedhar  Supervising Physician: Ruel Favors  Patient Status: [facility] - Out-pt  History of Present Illness: Claudia [name] is a 77 y.o. female, ex smoker,  with PMH sig for achalasia, gastroparesis, anemia, arthritis, COPD, GERD, HTN who presents now with recurrent extensive stage small cell lung cancer. She has had prior RUL wedge resection in 2015 with chemotherapy. She is scheduled today for port a cath placement to assist with treatment.   Past Medical History:  Diagnosis Date   Achalasia    Anemia    Arthritis    Bradycardia    mild,may be due to beta blocker therapy   COPD (chronic obstructive pulmonary disease) (HCC)    Gastroparesis    GERD (gastroesophageal reflux disease)    otc   Hypertension 06/01/2019   pt denies   Local recurrence of lung cancer (HCC) dx'd 07/2013   rt thoracotomy chemo comp 12/2013   Lung cancer (HCC) 03/30/2008   Dr. Arbutus Ped, finished chemo, sp radiation, left upper    Lymphocytic colitis    Pneumonia     Past Surgical History:  Procedure Laterality Date   BRONCHIAL BIOPSY  05/28/2022   Procedure: BRONCHIAL BIOPSIES;  Surgeon: Omar Person, MD;  Location: [facility] ENDOSCOPY;  Service: Pulmonary;;   BRONCHIAL BRUSHINGS  05/28/2022   Procedure: BRONCHIAL BRUSHINGS;  Surgeon: Omar Person, MD;  Location: [facility] ENDOSCOPY;  Service: Pulmonary;;   BRONCHIAL NEEDLE ASPIRATION BIOPSY  05/28/2022   Procedure: BRONCHIAL NEEDLE ASPIRATION BIOPSIES;  Surgeon: Omar Person, MD;  Location: [facility] ENDOSCOPY;  Service: Pulmonary;;   BRONCHIAL WASHINGS  05/28/2022   Procedure: BRONCHIAL WASHINGS;  Surgeon: Omar Person, MD;  Location: [facility] ENDOSCOPY;  Service: Pulmonary;;   BRONCHOSCOPY  2011   DILATION AND CURETTAGE OF UTERUS     ESOPHAGOGASTRODUODENOSCOPY (EGD) WITH PROPOFOL N/A 07/29/2021   Procedure:  ESOPHAGOGASTRODUODENOSCOPY (EGD) WITH PROPOFOL;  Surgeon: Dolores Frame, MD;  Location: [facility] ENDO SUITE;  Service: Gastroenterology;  Laterality: N/A;  1235 ASA 2   HEMOSTASIS CONTROL  05/28/2022   Procedure: HEMOSTASIS CONTROL;  Surgeon: Omar Person, MD;  Location: [facility] ENDOSCOPY;  Service: Pulmonary;;   NECK SURGERY  1980's   THORACOTOMY Right 09/21/2013   Procedure: THORACOTOMY MAJOR;  Surgeon: Alleen Borne, MD;  Location: [facility] OR;  Service: Thoracic;  Laterality: Right;   UTERINE FIBROID SURGERY  2012   WEDGE RESECTION Right 09/21/2013   Procedure: RIGHT UPPER LOBE WEDGE RESECTION;  Surgeon: Alleen Borne, MD;  Location: [facility] OR;  Service: Thoracic;  Laterality: Right;    Allergies: Azithromycin, Tussionex pennkinetic er [hydrocod poli-chlorphe poli er], and Omeprazole  Medications: Prior to Admission medications   Medication Sig Start Date End Date Taking? Authorizing Provider  Calcium Carbonate Antacid (TUMS PO) Take 500-1,000 mg by mouth 3 (three) times daily as needed (indigestion/heartburn.).    [provider]  Cholecalciferol (VITAMIN D-3) 25 MCG (1000 UT) CAPS Take 1,000 Units by mouth in the morning.    [provider]  Coenzyme Q10 100 MG TABS Take 100 mg by mouth in the morning.    [provider]  ferrous sulfate 325 (65 FE) MG tablet Take 1 tablet (325 mg total) by mouth daily. 10/09/21 10/09/22  Kendell Bane, MD  lidocaine-prilocaine (EMLA) cream Apply 1 Application topically as needed. 08/06/22   Doreatha Massed, MD  metoCLOPramide (REGLAN) 5 MG tablet Take 1 tablet (  5 mg total) by mouth 3 (three) times daily before meals. 05/18/22   Dolores Frame, MD  Multiple Vitamin (MULTIVITAMIN WITH MINERALS) TABS tablet Take 1 tablet by mouth in the morning.    [provider]  Probiotic Product (PROBIOTIC DAILY PO) Take 1 capsule by mouth in the morning.    [provider]  prochlorperazine (COMPAZINE) 10 MG  tablet Take 1 tablet (10 mg total) by mouth every 6 (six) hours as needed for nausea or vomiting. 08/06/22   Doreatha Massed, MD  rosuvastatin (CRESTOR) 20 MG tablet Take 1 tablet (20 mg total) by mouth daily. 05/04/22   Pricilla Riffle, MD  tretinoin (RETIN-A) 0.1 % cream Apply 1 application  topically at bedtime. 06/21/21   [provider]  Turmeric Curcumin 500 MG CAPS Take 500 mg by mouth in the morning.    [provider]     Family History  Problem Relation Age of Onset   Brain cancer Mother        brain cancer   Emphysema Father    Diabetes Son    Heart disease Paternal Grandfather    Breast cancer Maternal Aunt     Social History   Socioeconomic History   Marital status: Married    Spouse name: Not on file   Number of children: Not on file   Years of education: Not on file   Highest education level: Not on file  Occupational History   Occupation: clerical    Employer: [employer]  Tobacco Use   Smoking status: Former    Packs/day: 1.00    Years: 35.00    Additional pack years: 0.00    Total pack years: 35.00    Types: Cigarettes    Quit date: 03/31/2007    Years since quitting: 15.3    Passive exposure: Past   Smokeless tobacco: Never  Vaping Use   Vaping Use: Never used  Substance and Sexual Activity   Alcohol use: Yes    Comment: occassional about 3 drinks per year   Drug use: No   Sexual activity: Never  Other Topics Concern   Not on file  Social History Narrative   Not on file   Social Determinants of Health   Financial Resource Strain: Not on file  Food Insecurity: No Food Insecurity (08/06/2022)   Hunger Vital Sign    Worried About Running Out of Food in the Last Year: Never true    Ran Out of Food in the Last Year: Never true  Transportation Needs: No Transportation Needs (08/06/2022)   PRAPARE - Administrator, Civil Service (Medical): No    Lack of Transportation (Non-Medical): No  Physical Activity: Not on file   Stress: Not on file  Social Connections: Not on file      Review of Systems  Vital Signs:   Code Status:    Physical Exam  Imaging: CT Chest W Contrast  Result Date: 08/05/2022 CLINICAL DATA:  Small cell lung cancer.  * Tracking Code: BO * EXAM: CT CHEST WITH CONTRAST TECHNIQUE: Multidetector CT imaging of the chest was performed during intravenous contrast administration. RADIATION DOSE REDUCTION: This exam was performed according to the departmental dose-optimization program which includes automated exposure control, adjustment of the mA and/or kV according to patient size and/or use of iterative reconstruction technique. CONTRAST:  75mL OMNIPAQUE IOHEXOL 300 MG/ML  SOLN COMPARISON:  05/25/2022 and PET 04/23/2022. FINDINGS: Cardiovascular: Atherosclerotic calcification of the aorta. Heart size normal. No  pericardial effusion. Mediastinum/Nodes: No pathologically enlarged mediastinal, right hilar or axillary lymph nodes. Left hilum is difficult to evaluate due to collapse/consolidation. Esophagus is dilated and contains food debris throughout. Lungs/Pleura: Centrilobular emphysema. Postoperative scarring along the minor fissure. 4 mm apical segment right upper lobe nodule (4/37), new. Post radiation collapse/consolidation the medial upper left hemithorax, with note of associated abnormal hypermetabolism in anterior left upper lobe on 04/23/2022. New nodule in the superior segment left lower lobe measures 8 mm (7 x 8 mm, 4/61). There are additional new nodular densities in the inferior left lower lobe measuring up to 4 mm (4/127). No pleural fluid. Airway is otherwise unremarkable. Upper Abdomen: Faint 7 mm hypodense lesion in the left hepatic lobe (2/142). Comparison with 05/25/2022 is difficult due to lack of IV contrast on that study. Visualized portions of the liver, gallbladder and adrenal glands are otherwise unremarkable. Hyperdense lesion off the upper pole right kidney and  low-attenuation lesions in the left kidney. No specific follow-up necessary. Visualized portions of the spleen, pancreas, stomach and bowel are grossly unremarkable. Periportal adenopathy measures up to 2.3 x 2.6 cm (2/157), partially imaged. Musculoskeletal: Degenerative changes in the spine. Minimal retrolisthesis of L1 on L2. Peripherally sclerotic lucent lesion in the T10 vertebral body (7/83), similar. No new lesions. IMPRESSION: 1. Post radiation scarring in the medial left upper lobe with abnormal hypermetabolism seen in the anterior left upper lobe on 04/23/2022, compatible with residual/recurrent disease. CT appearance is similar. 2. Scattered new nodular densities in the lungs bilaterally, largest in the left lower lobe, compatible with progressive metastatic disease. 3. New subcentimeter hypodense lesion in the left hepatic lobe with bulky periportal adenopathy, indicative of new metastatic disease. 4. Patulous debris-filled esophagus, possibly due to achalasia. 5.  Aortic atherosclerosis (ICD10-I70.0). 6. Emphysema (ICD10-J43.9). Electronically Signed   By: Leanna Battles M.D.   On: 08/05/2022 13:42    Labs:  CBC: Recent Labs    04/23/22 1355 04/30/22 1051 05/25/22 1417 08/03/22 0917  WBC 3.8* 3.6* 5.7 5.5  HGB 11.2* 11.0* 11.3* 11.2*  HCT 32.8* 33.8* 33.5* 34.4*  PLT 133* 134* 139* 128*    COAGS: No results for input(s): "INR", "APTT" in the last 8760 hours.  BMP: Recent Labs    04/06/22 1104 04/23/22 1355 05/25/22 1417 08/03/22 0916  NA 138 139 140 136  K 3.7 3.9 3.9 3.5  CL 103 104 103 102  CO2 32 31 31 28   GLUCOSE 78 76 82 93  BUN 10 16 11 15   CALCIUM 10.2 10.2 9.7 9.7  CREATININE 0.81 0.86 0.75 0.79  GFRNONAA >60 >60 >60 >60    LIVER FUNCTION TESTS: Recent Labs    04/23/22 1355 04/30/22 1051 05/25/22 1417 08/03/22 0916  BILITOT 0.6 0.7 0.5 1.0  AST 14* 16 13* 14*  ALT 8 10 6 9   ALKPHOS 73 70 80 65  PROT 7.0 6.9 7.2 7.1  ALBUMIN 3.7 3.7 3.8 3.7     TUMOR MARKERS: No results for input(s): "AFPTM", "CEA", "CA199", "CHROMGRNA" in the last 8760 hours.  Assessment and Plan: 77 y.o. female, ex smoker,  with PMH sig for achalasia, gastroparesis, anemia, arthritis, COPD, GERD, HTN who presents now with recurrent extensive stage small cell lung cancer. She has had prior RUL wedge resection in 2015 with chemotherapy. She is scheduled today for port a cath placement to assist with treatment. Risks and benefits of image guided port-a-catheter placement was discussed with the patient including, but not limited to bleeding, infection, pneumothorax, or  fibrin sheath development and need for additional procedures.  All of the patient's questions were answered, patient is agreeable to proceed. Consent signed and in chart.    Thank you for this interesting consult.  I greatly enjoyed meeting MARTINEZ HENAULT and look forward to participating in their care.  A copy of this report was sent to the requesting provider on this date.  Electronically Signed: D. Jeananne Rama, PA-C 08/11/2022, 3:53 PM   I spent a total of  25 minutes   in face to face in clinical consultation, greater than 50% of which was counseling/coordinating care for port a cath placement

## 2022-08-12 ENCOUNTER — Other Ambulatory Visit: Payer: Self-pay

## 2022-08-12 ENCOUNTER — Ambulatory Visit (HOSPITAL_COMMUNITY)
Admission: RE | Admit: 2022-08-12 | Discharge: 2022-08-12 | Disposition: A | Discharge status: Home / Self Care | Payer: Medicare PPO | Source: Ambulatory Visit | Attending: Hematology | Admitting: Hematology

## 2022-08-12 ENCOUNTER — Encounter (HOSPITAL_COMMUNITY): Payer: Self-pay

## 2022-08-12 ENCOUNTER — Ambulatory Visit (HOSPITAL_COMMUNITY)
Admission: RE | Admit: 2022-08-12 | Discharge: 2022-08-12 | Disposition: A | Discharge status: Home / Self Care | Payer: Medicare PPO | Source: Ambulatory Visit

## 2022-08-12 DIAGNOSIS — K219 Gastro-esophageal reflux disease without esophagitis: Secondary | ICD-10-CM | POA: Diagnosis not present

## 2022-08-12 DIAGNOSIS — J449 Chronic obstructive pulmonary disease, unspecified: Secondary | ICD-10-CM | POA: Diagnosis not present

## 2022-08-12 DIAGNOSIS — C3490 Malignant neoplasm of unspecified part of unspecified bronchus or lung: Secondary | ICD-10-CM | POA: Insufficient documentation

## 2022-08-12 DIAGNOSIS — I10 Essential (primary) hypertension: Secondary | ICD-10-CM | POA: Diagnosis not present

## 2022-08-12 DIAGNOSIS — Z87891 Personal history of nicotine dependence: Secondary | ICD-10-CM | POA: Diagnosis not present

## 2022-08-12 DIAGNOSIS — Z452 Encounter for adjustment and management of vascular access device: Secondary | ICD-10-CM | POA: Diagnosis not present

## 2022-08-12 HISTORY — PX: IR IMAGING GUIDED PORT INSERTION: IMG5740

## 2022-08-12 MED ORDER — NALOXONE HCL 0.4 MG/ML IJ SOLN
INTRAMUSCULAR | Status: AC
  Filled 2022-08-12: qty 1

## 2022-08-12 MED ORDER — HEPARIN SOD (PORK) LOCK FLUSH 100 UNIT/ML IV SOLN
500.0000 [IU] | Freq: Once | INTRAVENOUS | Status: AC
  Administered 2022-08-12: 500 [IU] via INTRAVENOUS

## 2022-08-12 MED ORDER — MIDAZOLAM HCL 2 MG/2ML IJ SOLN
INTRAMUSCULAR | Status: AC | PRN
  Administered 2022-08-12 (×2): .5 mg via INTRAVENOUS

## 2022-08-12 MED ORDER — LIDOCAINE-EPINEPHRINE 1 %-1:100000 IJ SOLN
INTRAMUSCULAR | Status: AC
  Filled 2022-08-12: qty 1

## 2022-08-12 MED ORDER — LIDOCAINE-EPINEPHRINE 1 %-1:100000 IJ SOLN
20.0000 mL | Freq: Once | INTRAMUSCULAR | Status: AC
  Administered 2022-08-12: 20 mL via INTRADERMAL

## 2022-08-12 MED ORDER — FENTANYL CITRATE (PF) 100 MCG/2ML IJ SOLN
INTRAMUSCULAR | Status: AC | PRN
  Administered 2022-08-12 (×2): 25 ug via INTRAVENOUS

## 2022-08-12 MED ORDER — FLUMAZENIL 0.5 MG/5ML IV SOLN
INTRAVENOUS | Status: AC
  Filled 2022-08-12: qty 5

## 2022-08-12 MED ORDER — MIDAZOLAM HCL 2 MG/2ML IJ SOLN
INTRAMUSCULAR | Status: AC
  Filled 2022-08-12: qty 4

## 2022-08-12 MED ORDER — SODIUM CHLORIDE 0.9 % IV SOLN: INTRAVENOUS | Status: DC

## 2022-08-12 MED ORDER — FENTANYL CITRATE (PF) 100 MCG/2ML IJ SOLN
INTRAMUSCULAR | Status: AC
  Filled 2022-08-12: qty 4

## 2022-08-12 MED ORDER — HEPARIN SOD (PORK) LOCK FLUSH 100 UNIT/ML IV SOLN
INTRAVENOUS | Status: AC
  Filled 2022-08-12: qty 5

## 2022-08-12 NOTE — Discharge Instructions (Signed)
Please call Interventional Radiology clinic [phone] with any questions or concerns.  You may remove your dressing and shower tomorrow.  DO NOT use EMLA cream on your port site for 2 weeks as this cream will remove surgical glue on your incision.  Implanted Port Insertion, Care After This sheet gives you information about how to care for yourself after your procedure. Your health care provider may also give you more specific instructions. If you have problems or questions, contact your health care provider. What can I expect after the procedure? After the procedure, it is common to have: Discomfort at the port insertion site. Bruising on the skin over the port. This should improve over 3-4 days. Follow these instructions at home: [facility] care After your port is placed, you will get a manufacturer's information card. The card has information about your port. Keep this card with you at all times. Take care of the port as told by your health care provider. Ask your health care provider if you or a family member can get training for taking care of the port at home. A home health care nurse may also take care of the port. Make sure to remember what type of port you have. Incision care Follow instructions from your health care provider about how to take care of your port insertion site. Make sure you: Wash your hands with soap and water before and after you change your bandage (dressing). If soap and water are not available, use hand sanitizer. Change your dressing as told by your health care provider. Leave stitches (sutures), skin glue, or adhesive strips in place. These skin closures may need to stay in place for 2 weeks or longer. If adhesive strip edges start to loosen and curl up, you may trim the loose edges. Do not remove adhesive strips completely unless your health care provider tells you to do that. Check your port insertion site every day for signs of infection. Check for: Redness,  swelling, or pain. Fluid or blood. Warmth. Pus or a bad smell.        Activity Return to your normal activities as told by your health care provider. Ask your health care provider what activities are safe for you. Do not lift anything that is heavier than 10 lb (4.5 kg), or the limit that you are told, until your health care provider says that it is safe. General instructions Take over-the-counter and prescription medicines only as told by your health care provider. Do not take baths, swim, or use a hot tub until your health care provider approves. Ask your health care provider if you may take showers. You may only be allowed to take sponge baths. Do not drive for 24 hours if you were given a sedative during your procedure. Wear a medical alert bracelet in case of an emergency. This will tell any health care providers that you have a port. Keep all follow-up visits as told by your health care provider. This is important. Contact a health care provider if: You cannot flush your port with saline as directed, or you cannot draw blood from the port. You have a fever or chills. You have redness, swelling, or pain around your port insertion site. You have fluid or blood coming from your port insertion site. Your port insertion site feels warm to the touch. You have pus or a bad smell coming from the port insertion site. Get help right away if: You have chest pain or shortness of breath. You have bleeding from  your port that you cannot control. Summary Take care of the port as told by your health care provider. Keep the manufacturer's information card with you at all times. Change your dressing as told by your health care provider. Contact a health care provider if you have a fever or chills or if you have redness, swelling, or pain around your port insertion site. Keep all follow-up visits as told by your health care provider. This information is not intended to replace advice given to you  by your health care provider. Make sure you discuss any questions you have with your health care provider. Document Revised: 10/12/2017 Document Reviewed: 10/12/2017 Elsevier Patient Education  2021 Elsevier Inc.   Moderate Conscious Sedation, Adult, Care After This sheet gives you information about how to care for yourself after your procedure. Your health care provider may also give you more specific instructions. If you have problems or questions, contact your health care provider. What can I expect after the procedure? After the procedure, it is common to have: Sleepiness for several hours. Impaired judgment for several hours. Difficulty with balance. Vomiting if you eat too soon. Follow these instructions at home: For the time period you were told by your health care provider: Rest. Do not participate in activities where you could fall or become injured. Do not drive or use machinery. Do not drink alcohol. Do not take sleeping pills or medicines that cause drowsiness. Do not make important decisions or sign legal documents. Do not take care of children on your own.        Eating and drinking Follow the diet recommended by your health care provider. Drink enough fluid to keep your urine pale yellow. If you vomit: Drink water, juice, or soup when you can drink without vomiting. Make sure you have little or no nausea before eating solid foods.    General instructions Take over-the-counter and prescription medicines only as told by your health care provider. Have a responsible adult stay with you for the time you are told. It is important to have someone help care for you until you are awake and alert. Do not smoke. Keep all follow-up visits as told by your health care provider. This is important. Contact a health care provider if: You are still sleepy or having trouble with balance after 24 hours. You feel light-headed. You keep feeling nauseous or you keep vomiting. You  develop a rash. You have a fever. You have redness or swelling around the IV site. Get help right away if: You have trouble breathing. You have new-onset confusion at home. Summary After the procedure, it is common to feel sleepy, have impaired judgment, or feel nauseous if you eat too soon. Rest after you get home. Know the things you should not do after the procedure. Follow the diet recommended by your health care provider and drink enough fluid to keep your urine pale yellow. Get help right away if you have trouble breathing or new-onset confusion at home. This information is not intended to replace advice given to you by your health care provider. Make sure you discuss any questions you have with your health care provider. Document Revised: 07/14/2019 Document Reviewed: 02/09/2019 Elsevier Patient Education  2021 Elsevier Inc.Please call Interventional Radiology clinic [phone] with any questions or concerns.  You may remove your dressing and shower tomorrow.  DO NOT use EMLA cream on your port site for 2 weeks as this cream will remove surgical glue on your incision.  Implanted Port Insertion,  Care After This sheet gives you information about how to care for yourself after your procedure. Your health care provider may also give you more specific instructions. If you have problems or questions, contact your health care provider. What can I expect after the procedure? After the procedure, it is common to have: Discomfort at the port insertion site. Bruising on the skin over the port. This should improve over 3-4 days. Follow these instructions at home: [facility] care After your port is placed, you will get a manufacturer's information card. The card has information about your port. Keep this card with you at all times. Take care of the port as told by your health care provider. Ask your health care provider if you or a family member can get training for taking care of the port at  home. A home health care nurse may also take care of the port. Make sure to remember what type of port you have. Incision care Follow instructions from your health care provider about how to take care of your port insertion site. Make sure you: Wash your hands with soap and water before and after you change your bandage (dressing). If soap and water are not available, use hand sanitizer. Change your dressing as told by your health care provider. Leave stitches (sutures), skin glue, or adhesive strips in place. These skin closures may need to stay in place for 2 weeks or longer. If adhesive strip edges start to loosen and curl up, you may trim the loose edges. Do not remove adhesive strips completely unless your health care provider tells you to do that. Check your port insertion site every day for signs of infection. Check for: Redness, swelling, or pain. Fluid or blood. Warmth. Pus or a bad smell.        Activity Return to your normal activities as told by your health care provider. Ask your health care provider what activities are safe for you. Do not lift anything that is heavier than 10 lb (4.5 kg), or the limit that you are told, until your health care provider says that it is safe. General instructions Take over-the-counter and prescription medicines only as told by your health care provider. Do not take baths, swim, or use a hot tub until your health care provider approves. Ask your health care provider if you may take showers. You may only be allowed to take sponge baths. Do not drive for 24 hours if you were given a sedative during your procedure. Wear a medical alert bracelet in case of an emergency. This will tell any health care providers that you have a port. Keep all follow-up visits as told by your health care provider. This is important. Contact a health care provider if: You cannot flush your port with saline as directed, or you cannot draw blood from the port. You have a  fever or chills. You have redness, swelling, or pain around your port insertion site. You have fluid or blood coming from your port insertion site. Your port insertion site feels warm to the touch. You have pus or a bad smell coming from the port insertion site. Get help right away if: You have chest pain or shortness of breath. You have bleeding from your port that you cannot control. Summary Take care of the port as told by your health care provider. Keep the manufacturer's information card with you at all times. Change your dressing as told by your health care provider. Contact a health care provider if you  have a fever or chills or if you have redness, swelling, or pain around your port insertion site. Keep all follow-up visits as told by your health care provider. This information is not intended to replace advice given to you by your health care provider. Make sure you discuss any questions you have with your health care provider. Document Revised: 10/12/2017 Document Reviewed: 10/12/2017 Elsevier Patient Education  2021 Elsevier Inc.   Moderate Conscious Sedation, Adult, Care After This sheet gives you information about how to care for yourself after your procedure. Your health care provider may also give you more specific instructions. If you have problems or questions, contact your health care provider. What can I expect after the procedure? After the procedure, it is common to have: Sleepiness for several hours. Impaired judgment for several hours. Difficulty with balance. Vomiting if you eat too soon. Follow these instructions at home: For the time period you were told by your health care provider: Rest. Do not participate in activities where you could fall or become injured. Do not drive or use machinery. Do not drink alcohol. Do not take sleeping pills or medicines that cause drowsiness. Do not make important decisions or sign legal documents. Do not take care of  children on your own.        Eating and drinking Follow the diet recommended by your health care provider. Drink enough fluid to keep your urine pale yellow. If you vomit: Drink water, juice, or soup when you can drink without vomiting. Make sure you have little or no nausea before eating solid foods.    General instructions Take over-the-counter and prescription medicines only as told by your health care provider. Have a responsible adult stay with you for the time you are told. It is important to have someone help care for you until you are awake and alert. Do not smoke. Keep all follow-up visits as told by your health care provider. This is important. Contact a health care provider if: You are still sleepy or having trouble with balance after 24 hours. You feel light-headed. You keep feeling nauseous or you keep vomiting. You develop a rash. You have a fever. You have redness or swelling around the IV site. Get help right away if: You have trouble breathing. You have new-onset confusion at home. Summary After the procedure, it is common to feel sleepy, have impaired judgment, or feel nauseous if you eat too soon. Rest after you get home. Know the things you should not do after the procedure. Follow the diet recommended by your health care provider and drink enough fluid to keep your urine pale yellow. Get help right away if you have trouble breathing or new-onset confusion at home. This information is not intended to replace advice given to you by your health care provider. Make sure you discuss any questions you have with your health care provider. Document Revised: 07/14/2019 Document Reviewed: 02/09/2019 Elsevier Patient Education  2021 [employer].

## 2022-08-12 NOTE — Procedures (Signed)
Vascular and Interventional Radiology Procedure Note  Patient: Claudia [name] DOB: November 28, 1945 Medical Record Number: [id number] Note Date/Time: 08/12/22 1:23 PM   Performing Physician: Roanna Banning, MD Assistant(s): None  Diagnosis: Lung cancer  Procedure: PORT PLACEMENT  Anesthesia: Conscious Sedation Complications: None Estimated Blood Loss: Minimal  Findings:  Successful left-sided port placement, with the tip of the catheter in the proximal right atrium.  Plan: Catheter ready for use.  See detailed procedure note with images in PACS. The patient tolerated the procedure well without incident or complication and was returned to Recovery in stable condition.    Roanna Banning, MD [facility] Radiology   Pager. [phone] Clinic. [phone]

## 2022-08-13 ENCOUNTER — Encounter (HOSPITAL_COMMUNITY)
Admission: RE | Admit: 2022-08-13 | Discharge: 2022-08-13 | Disposition: A | Discharge status: Home / Self Care | Payer: Medicare PPO | Source: Ambulatory Visit | Attending: Hematology | Admitting: Hematology

## 2022-08-13 ENCOUNTER — Telehealth (INDEPENDENT_AMBULATORY_CARE_PROVIDER_SITE_OTHER): Payer: Self-pay | Admitting: *Deleted

## 2022-08-13 DIAGNOSIS — R918 Other nonspecific abnormal finding of lung field: Secondary | ICD-10-CM | POA: Insufficient documentation

## 2022-08-13 DIAGNOSIS — C3490 Malignant neoplasm of unspecified part of unspecified bronchus or lung: Secondary | ICD-10-CM | POA: Diagnosis not present

## 2022-08-13 DIAGNOSIS — I700 Atherosclerosis of aorta: Secondary | ICD-10-CM | POA: Insufficient documentation

## 2022-08-13 MED ORDER — FLUDEOXYGLUCOSE F - 18 (FDG) INJECTION
4.9100 | Freq: Once | INTRAVENOUS | Status: AC | PRN
  Administered 2022-08-13: 4.91 via INTRAVENOUS

## 2022-08-13 NOTE — Telephone Encounter (Signed)
Patient left message she will be cancelling apts at [facility] we set up for her, she has cancer and will be going through treatment through August

## 2022-08-13 NOTE — Telephone Encounter (Signed)
Thanks for the update

## 2022-08-14 ENCOUNTER — Other Ambulatory Visit: Payer: Self-pay

## 2022-08-14 ENCOUNTER — Ambulatory Visit (HOSPITAL_COMMUNITY)
Admission: RE | Admit: 2022-08-14 | Discharge: 2022-08-14 | Disposition: A | Discharge status: Home / Self Care | Payer: Medicare PPO | Source: Ambulatory Visit | Attending: Hematology | Admitting: Hematology

## 2022-08-14 DIAGNOSIS — C3490 Malignant neoplasm of unspecified part of unspecified bronchus or lung: Secondary | ICD-10-CM | POA: Diagnosis not present

## 2022-08-14 MED ORDER — GADOBUTROL 1 MMOL/ML IV SOLN
4.5000 mL | Freq: Once | INTRAVENOUS | Status: AC | PRN
  Administered 2022-08-14: 4.5 mL via INTRAVENOUS

## 2022-08-14 NOTE — Progress Notes (Signed)
Patient has received >6 doses of carboplatin the past.  The following premedication has been added to the new treatment plan:  Cetirizine 10 mg IVPush x 1 Famotidine 20 mg IVPB x 1  Pryor Ochoa, PharmD 08/14/22 @0800 

## 2022-08-14 NOTE — Progress Notes (Signed)
Pharmacist Chemotherapy Monitoring - Initial Assessment    Anticipated start date: 08/17/22   The following has been reviewed per standard work regarding the patient's treatment regimen: The patient's diagnosis, treatment plan and drug doses, and organ/hematologic function Lab orders and baseline tests specific to treatment regimen  The treatment plan start date, drug sequencing, and pre-medications Prior authorization status  Patient's documented medication list, including drug-drug interaction screen and prescriptions for anti-emetics and supportive care specific to the treatment regimen The drug concentrations, fluid compatibility, administration routes, and timing of the medications to be used The patient's access for treatment and lifetime cumulative dose history, if applicable  The patient's medication allergies and previous infusion related reactions, if applicable   Changes made to treatment plan:  N/A  Follow up needed:  N/A   Stephens Shire, [facility], 08/14/2022  7:45 AM

## 2022-08-17 ENCOUNTER — Inpatient Hospital Stay (HOSPITAL_BASED_OUTPATIENT_CLINIC_OR_DEPARTMENT_OTHER): Payer: Medicare PPO | Admitting: Hematology

## 2022-08-17 ENCOUNTER — Inpatient Hospital Stay: Payer: Medicare PPO

## 2022-08-17 VITALS — BP 138/61 | HR 80 | Temp 96.7°F | Resp 18

## 2022-08-17 VITALS — BP 140/61 | HR 91 | Temp 96.1°F | Resp 18 | Wt 96.6 lb

## 2022-08-17 DIAGNOSIS — C3490 Malignant neoplasm of unspecified part of unspecified bronchus or lung: Secondary | ICD-10-CM

## 2022-08-17 DIAGNOSIS — Z79899 Other long term (current) drug therapy: Secondary | ICD-10-CM | POA: Diagnosis not present

## 2022-08-17 DIAGNOSIS — K3184 Gastroparesis: Secondary | ICD-10-CM | POA: Diagnosis not present

## 2022-08-17 DIAGNOSIS — Z87891 Personal history of nicotine dependence: Secondary | ICD-10-CM | POA: Diagnosis not present

## 2022-08-17 DIAGNOSIS — Z803 Family history of malignant neoplasm of breast: Secondary | ICD-10-CM | POA: Diagnosis not present

## 2022-08-17 DIAGNOSIS — Z5111 Encounter for antineoplastic chemotherapy: Secondary | ICD-10-CM | POA: Diagnosis not present

## 2022-08-17 DIAGNOSIS — Z95828 Presence of other vascular implants and grafts: Secondary | ICD-10-CM

## 2022-08-17 DIAGNOSIS — D508 Other iron deficiency anemias: Secondary | ICD-10-CM

## 2022-08-17 DIAGNOSIS — Z5189 Encounter for other specified aftercare: Secondary | ICD-10-CM | POA: Diagnosis not present

## 2022-08-17 DIAGNOSIS — C3411 Malignant neoplasm of upper lobe, right bronchus or lung: Secondary | ICD-10-CM | POA: Diagnosis not present

## 2022-08-17 DIAGNOSIS — R634 Abnormal weight loss: Secondary | ICD-10-CM | POA: Diagnosis not present

## 2022-08-17 LAB — VITAMIN B12: Vitamin B-12: 494 pg/mL (ref 180–914)

## 2022-08-17 LAB — IRON AND TIBC
Iron: 51 ug/dL (ref 28–170)
Saturation Ratios: 21 % (ref 10.4–31.8)
TIBC: 238 ug/dL — ABNORMAL LOW (ref 250–450)
UIBC: 187 ug/dL

## 2022-08-17 LAB — COMPREHENSIVE METABOLIC PANEL
ALT: 8 U/L (ref 0–44)
AST: 15 U/L (ref 15–41)
Albumin: 3.4 g/dL — ABNORMAL LOW (ref 3.5–5.0)
Alkaline Phosphatase: 61 U/L (ref 38–126)
Anion gap: 5 (ref 5–15)
BUN: 19 mg/dL (ref 8–23)
CO2: 28 mmol/L (ref 22–32)
Calcium: 9.5 mg/dL (ref 8.9–10.3)
Chloride: 104 mmol/L (ref 98–111)
Creatinine, Ser: 0.79 mg/dL (ref 0.44–1.00)
GFR, Estimated: >60 mL/min (ref >60)
Glucose, Bld: 91 mg/dL (ref 70–99)
Potassium: 3.4 mmol/L — ABNORMAL LOW (ref 3.5–5.1)
Sodium: 137 mmol/L (ref 135–145)
Total Bilirubin: 0.9 mg/dL (ref 0.3–1.2)
Total Protein: 6.4 g/dL — ABNORMAL LOW (ref 6.5–8.1)

## 2022-08-17 LAB — CBC WITH DIFFERENTIAL/PLATELET
Abs Immature Granulocytes: 0.02 10*3/uL (ref 0.00–0.07)
Basophils Absolute: 0 10*3/uL (ref 0.0–0.1)
Basophils Relative: 1 %
Eosinophils Absolute: 0 10*3/uL (ref 0.0–0.5)
Eosinophils Relative: 1 %
HCT: 31.6 % — ABNORMAL LOW (ref 36.0–46.0)
Hemoglobin: 10.4 g/dL — ABNORMAL LOW (ref 12.0–15.0)
Immature Granulocytes: 0 %
Lymphocytes Relative: 10 %
Lymphs Abs: 0.5 10*3/uL — ABNORMAL LOW (ref 0.7–4.0)
MCH: 31 pg (ref 26.0–34.0)
MCHC: 32.9 g/dL (ref 30.0–36.0)
MCV: 94 fL (ref 80.0–100.0)
Monocytes Absolute: 0.8 10*3/uL (ref 0.1–1.0)
Monocytes Relative: 17 %
Neutro Abs: 3.4 10*3/uL (ref 1.7–7.7)
Neutrophils Relative %: 71 %
Platelets: 117 10*3/uL — ABNORMAL LOW (ref 150–400)
RBC: 3.36 MIL/uL — ABNORMAL LOW (ref 3.87–5.11)
RDW: 14.9 % (ref 11.5–15.5)
WBC: 4.8 10*3/uL (ref 4.0–10.5)
nRBC: 0 % (ref 0.0–0.2)

## 2022-08-17 LAB — MAGNESIUM: Magnesium: 1.5 mg/dL — ABNORMAL LOW (ref 1.7–2.4)

## 2022-08-17 LAB — TSH: TSH: 1.428 u[IU]/mL (ref 0.350–4.500)

## 2022-08-17 LAB — FERRITIN: Ferritin: 296 ng/mL (ref 11–307)

## 2022-08-17 LAB — FOLATE: Folate: 11.7 ng/mL (ref >5.9)

## 2022-08-17 MED ORDER — SODIUM CHLORIDE 0.9% FLUSH
10.0000 mL | Freq: Once | INTRAVENOUS | Status: AC
  Administered 2022-08-17: 10 mL via INTRAVENOUS

## 2022-08-17 MED ORDER — SODIUM CHLORIDE 0.9 % IV SOLN
80.0000 mg/m2 | Freq: Once | INTRAVENOUS | Status: AC
  Administered 2022-08-17: 112 mg via INTRAVENOUS
  Filled 2022-08-17: qty 5.6

## 2022-08-17 MED ORDER — SODIUM CHLORIDE 0.9 % IV SOLN
230.0000 mg | Freq: Once | INTRAVENOUS | Status: AC
  Administered 2022-08-17: 230 mg via INTRAVENOUS
  Filled 2022-08-17: qty 23

## 2022-08-17 MED ORDER — SODIUM CHLORIDE 0.9 % IV SOLN
150.0000 mg | Freq: Once | INTRAVENOUS | Status: AC
  Administered 2022-08-17: 150 mg via INTRAVENOUS
  Filled 2022-08-17: qty 150

## 2022-08-17 MED ORDER — SODIUM CHLORIDE 0.9% FLUSH
10.0000 mL | INTRAVENOUS | Status: DC | PRN
  Administered 2022-08-17: 10 mL

## 2022-08-17 MED ORDER — SODIUM CHLORIDE 0.9 % IV SOLN
1500.0000 mg | Freq: Once | INTRAVENOUS | Status: AC
  Administered 2022-08-17: 1500 mg via INTRAVENOUS
  Filled 2022-08-17: qty 30

## 2022-08-17 MED ORDER — CETIRIZINE HCL 10 MG/ML IV SOLN
10.0000 mg | Freq: Once | INTRAVENOUS | Status: AC
  Administered 2022-08-17: 10 mg via INTRAVENOUS
  Filled 2022-08-17: qty 1

## 2022-08-17 MED ORDER — MAGNESIUM SULFATE 2 GM/50ML IV SOLN
2.0000 g | Freq: Once | INTRAVENOUS | Status: AC
  Administered 2022-08-17: 2 g via INTRAVENOUS
  Filled 2022-08-17: qty 50

## 2022-08-17 MED ORDER — PALONOSETRON HCL INJECTION 0.25 MG/5ML
0.2500 mg | Freq: Once | INTRAVENOUS | Status: AC
  Administered 2022-08-17: 0.25 mg via INTRAVENOUS
  Filled 2022-08-17: qty 5

## 2022-08-17 MED ORDER — PROCHLORPERAZINE MALEATE 10 MG PO TABS
10.0000 mg | ORAL_TABLET | Freq: Four times a day (QID) | ORAL | 4 refills | Status: DC | PRN
Start: 2022-08-17 — End: 2022-12-24

## 2022-08-17 MED ORDER — SODIUM CHLORIDE 0.9 % IV SOLN: Freq: Once | INTRAVENOUS | Status: AC

## 2022-08-17 MED ORDER — HEPARIN SOD (PORK) LOCK FLUSH 100 UNIT/ML IV SOLN
500.0000 [IU] | Freq: Once | INTRAVENOUS | Status: AC | PRN
  Administered 2022-08-17: 500 [IU]

## 2022-08-17 MED ORDER — LIDOCAINE-PRILOCAINE 2.5-2.5 % EX CREA
TOPICAL_CREAM | CUTANEOUS | 3 refills | Status: DC
Start: 2022-08-17 — End: 2023-06-22

## 2022-08-17 MED ORDER — SODIUM CHLORIDE 0.9 % IV SOLN
10.0000 mg | Freq: Once | INTRAVENOUS | Status: AC
  Administered 2022-08-17: 10 mg via INTRAVENOUS
  Filled 2022-08-17: qty 10

## 2022-08-17 MED ORDER — FAMOTIDINE IN NACL 20-0.9 MG/50ML-% IV SOLN
20.0000 mg | Freq: Once | INTRAVENOUS | Status: AC
  Administered 2022-08-17: 20 mg via INTRAVENOUS
  Filled 2022-08-17: qty 50

## 2022-08-17 NOTE — Patient Instructions (Signed)
[facility]  Discharge Instructions: Thank you for choosing [facility] to provide your oncology and hematology care.  If you have a lab appointment with the Cancer Center - please note that after April 8th, 2024, all labs will be drawn in the cancer center.  You do not have to check in or register with the main entrance as you have in the past but will complete your check-in in the cancer center.  Wear comfortable clothing and clothing appropriate for easy access to any Portacath or PICC line.   We strive to give you quality time with your provider. You may need to reschedule your appointment if you arrive late (15 or more minutes).  Arriving late affects you and other patients whose appointments are after yours.  Also, if you miss three or more appointments without notifying the office, you may be dismissed from the clinic at the provider's discretion.      For prescription refill requests, have your pharmacy contact our office and allow 72 hours for refills to be completed.    Today you received the following chemotherapy and/or immunotherapy agents Imfinzi/Carboplatin/Etoposide      To help prevent nausea and vomiting after your treatment, we encourage you to take your nausea medication as directed.  BELOW ARE SYMPTOMS THAT SHOULD BE REPORTED IMMEDIATELY: *FEVER GREATER THAN 100.4 F (38 C) OR HIGHER *CHILLS OR SWEATING *NAUSEA AND VOMITING THAT IS NOT CONTROLLED WITH YOUR NAUSEA MEDICATION *UNUSUAL SHORTNESS OF BREATH *UNUSUAL BRUISING OR BLEEDING *URINARY PROBLEMS (pain or burning when urinating, or frequent urination) *BOWEL PROBLEMS (unusual diarrhea, constipation, pain near the anus) TENDERNESS IN MOUTH AND THROAT WITH OR WITHOUT PRESENCE OF ULCERS (sore throat, sores in mouth, or a toothache) UNUSUAL RASH, SWELLING OR PAIN  UNUSUAL VAGINAL DISCHARGE OR ITCHING   Items with * indicate a potential emergency and should be followed up as soon as  possible or go to the Emergency Department if any problems should occur.  Please show the CHEMOTHERAPY ALERT CARD or IMMUNOTHERAPY ALERT CARD at check-in to the Emergency Department and triage nurse.  Should you have questions after your visit or need to cancel or reschedule your appointment, please contact [facility] AT [facility] [phone]  and follow the prompts.  Office hours are 8:00 a.m. to 4:30 p.m. Monday - Friday. Please note that voicemails left after 4:00 p.m. may not be returned until the following business day.  We are closed weekends and major holidays. You have access to a nurse at all times for urgent questions. Please call the main number to the clinic [phone] and follow the prompts.  For any non-urgent questions, you may also contact your provider using MyChart. We now offer e-Visits for anyone 56 and older to request care online for non-urgent symptoms. For details visit [email or web address].   Also download the MyChart app! Go to the app store, search "MyChart", open the app, select [place], and log in with your MyChart username and password.

## 2022-08-17 NOTE — Progress Notes (Signed)
Patient has been examined by Dr. Ellin Saba. Vital signs and labs have been reviewed by MD - ANC, Creatinine, LFTs, hemoglobin, and platelets are within treatment parameters per M.D. - pt may proceed with treatment. Magnesium 2 g IV to be given per standing orders Primary RN and pharmacy notified.

## 2022-08-17 NOTE — Patient Instructions (Signed)
[facility] at [facility] Discharge Instructions   You were seen and examined today by Dr. Ellin Saba.  He reviewed the results of your lab work which are normal/stable.   He reviewed the results of your MRI which did not show any cancer in the brain.  We are awaiting the radiologist to read the results of your PET scan.   We will proceed with your treatment today.   Return as scheduled.    Thank you for choosing [facility] at [facility] to provide your oncology and hematology care.  To afford each patient quality time with our provider, please arrive at least 15 minutes before your scheduled appointment time.   If you have a lab appointment with the Cancer Center please come in thru the Main Entrance and check in at the main information desk.  You need to re-schedule your appointment should you arrive 10 or more minutes late.  We strive to give you quality time with our providers, and arriving late affects you and other patients whose appointments are after yours.  Also, if you no show three or more times for appointments you may be dismissed from the clinic at the providers discretion.     Again, thank you for choosing [facility]  Our hope is that these requests will decrease the amount of time that you wait before being seen by our physicians.       _____________________________________________________________  Should you have questions after your visit to [facility], please contact our office at [phone] and follow the prompts.  Our office hours are 8:00 a.m. and 4:30 p.m. Monday - Friday.  Please note that voicemails left after 4:00 p.m. may not be returned until the following business day.  We are closed weekends and major holidays.  You do have access to a nurse 24-7, just call the main number to the clinic [phone] and do not press any options, hold on the line and a nurse will answer the phone.     For prescription refill requests, have your pharmacy contact our office and allow 72 hours.    Due to Covid, you will need to wear a mask upon entering the hospital. If you do not have a mask, a mask will be given to you at the Main Entrance upon arrival. For doctor visits, patients may have 1 support person age 28 or older with them. For treatment visits, patients can not have anyone with them due to social distancing guidelines and our immunocompromised population.

## 2022-08-17 NOTE — Progress Notes (Signed)
[facility] [address] [place] [address]    Clinic Day:  08/17/2022  Referring physician: Ignatius Specking, MD  Patient Care Team: Ignatius Specking, MD as PCP - General (Internal Medicine) Pricilla Riffle, MD as PCP - Cardiology (Cardiology) Charna Elizabeth, MD as Consulting Physician (Gastroenterology) Doreatha Massed, MD as Medical Oncologist (Medical Oncology) Therese Sarah, RN as Oncology Nurse Navigator (Medical Oncology)   ASSESSMENT & PLAN:   Assessment: 1.  Recurrent extensive stage small cell lung cancer: - Patient seen at the request of Dr. Shirline Frees to get care closer to home. - 4 cycles of carboplatin and etoposide with concurrent radiation therapy for limited stage small cell lung cancer on 11/14/2009 - Status post PCI completed on 01/28/2010 - Wedge resection of the RUL by Dr. Lavinia Sharps on 09/20/2013, pathology consistent with small cell lung cancer. - 4 cycles of carboplatin and etoposide from 10/30/2013 through 01/08/2014 - PET scan on 04/23/2012: Marked hypermetabolism in the medial and anterior aspect of the left upper lobe.  Several hypermetabolic lung nodules in the left lung.  9 mm lesion in the left lower lobe.  No metastatic disease in the AP. - Bronchoscopy and FNA of the LUL nodule and LUL brushing and lavage with no malignant cells. - CT chest (08/03/2022): Scattered new nodular densities in the lungs bilaterally, largest in the left lower lobe.  New subcentimeter hypodense lesion in the left hepatic lobe 7 mm.  Periportal adenopathy 2.3 x 2.6 cm.   2.  Social/family history: - She moved back to [place] recently and lives with her husband.  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  She also worked at [employer] for 15 years.  Quit smoking in 2010 and smoked 1 pack/day for 30 years. - Mother had brain tumor.  Maternal aunt had breast cancer.    Plan: 1.  Recurrent extensive stage small cell lung cancer: - She has  port placed. - We reviewed MRI of brain results from 08/14/2022: No metastatic disease.  Progression of small vessel ischemic disease. - PET scan results are not available. - We talked about the chemotherapy side effects today. - We will start her at carboplatin AUC 4 and VP-16 80 mg/m due to her decreased p.o. intake from gastroparesis and history of poor tolerance with last chemotherapy. - She will be evaluated in our symptom management clinic in 2 weeks.  RTC 3 weeks for follow-up.   2.  Gastroparesis: - She has lost 30 pounds in the last 1 year with vomiting.  She is drinking half can of fair life per day.  Will increase to 1 can per day. - Continue Reglan 5 mg 3 times daily before meals and eat multiple small meals.  Continue follow-up with Dr. Levon Hedger.  3.  Hypomagnesemia: - She will receive IV magnesium today.  If it continues to be low, will start oral supplementation.    Orders Placed This Encounter  Procedures   Iron and TIBC ([facility] ONLY)   Ferritin   Vitamin B12   Folate      I,Katie Daubenspeck,acting as a scribe for Doreatha Massed, MD.,have documented all relevant documentation on the behalf of Doreatha Massed, MD,as directed by  Doreatha Massed, MD while in the presence of Doreatha Massed, MD.   I, Doreatha Massed MD, have reviewed the above documentation for accuracy and completeness, and I agree with the above.   Doreatha Massed, MD   5/20/20245:31  PM  CHIEF COMPLAINT:   Diagnosis: recurrent small cell lung cancer    Cancer Staging  Small cell lung cancer (HCC) Staging form: Lung, AJCC 7th Edition - Clinical: Limited Stage Small cell Lung Cancer - Signed by Si Gaul, MD on 05/10/2013    Prior Therapy: 1. Concurrent chemoradiation with carboplatin and etoposide, completed 11/14/2009.  2. Prophylactic cranial irradiation, completed 01/28/2010. 3. RUL wedge resection on 09/21/2013 (Dr. Laneta Simmers) 4. 4 cycles  of carboplatin and etoposide, 10/30/2013 - 01/10/2014  Current Therapy:  Carboplatin, etoposide and durvalumab    HISTORY OF PRESENT ILLNESS:   Oncology History  Small cell lung cancer (HCC)  04/10/2011 Initial Diagnosis   Small cell lung cancer (HCC)   08/17/2022 -  Chemotherapy   Patient is on Treatment Plan : LUNG SMALL CELL EXTENSIVE STAGE Durvalumab + Carboplatin D1 + Etoposide D1-3 q21d x 4 Cycles / Durvalumab q28d        INTERVAL HISTORY:   Claudia [name] is a 77 y.o. female presenting to clinic today for follow up of recurrent small cell lung cancer. She was last seen by me on 08/06/22.  Since her last visit, she had port placed on 08/12/22.  She also underwent restaging PET scan on 08/13/22, results pending.  She also underwent brain MRI on 08/14/22 did not show metastatic disease  Today, she states that she is doing well overall. Her appetite level is at 40%. Her energy level is at 85%.  PAST MEDICAL HISTORY:   Past Medical History: Past Medical History:  Diagnosis Date   Achalasia    Anemia    Arthritis    Bradycardia    mild,may be due to beta blocker therapy   COPD (chronic obstructive pulmonary disease) (HCC)    Gastroparesis    GERD (gastroesophageal reflux disease)    otc   Hypertension 06/01/2019   pt denies   Local recurrence of lung cancer (HCC) dx'd 07/2013   rt thoracotomy chemo comp 12/2013   Lung cancer (HCC) 03/30/2008   Dr. Arbutus Ped, finished chemo, sp radiation, left upper    Lymphocytic colitis    Pneumonia     Surgical History: Past Surgical History:  Procedure Laterality Date   BRONCHIAL BIOPSY  05/28/2022   Procedure: BRONCHIAL BIOPSIES;  Surgeon: Omar Person, MD;  Location: [facility] ENDOSCOPY;  Service: Pulmonary;;   BRONCHIAL BRUSHINGS  05/28/2022   Procedure: BRONCHIAL BRUSHINGS;  Surgeon: Omar Person, MD;  Location: [facility] ENDOSCOPY;  Service: Pulmonary;;   BRONCHIAL NEEDLE ASPIRATION BIOPSY  05/28/2022   Procedure: BRONCHIAL NEEDLE  ASPIRATION BIOPSIES;  Surgeon: Omar Person, MD;  Location: [facility] ENDOSCOPY;  Service: Pulmonary;;   BRONCHIAL WASHINGS  05/28/2022   Procedure: BRONCHIAL WASHINGS;  Surgeon: Omar Person, MD;  Location: [facility] ENDOSCOPY;  Service: Pulmonary;;   BRONCHOSCOPY  2011   DILATION AND CURETTAGE OF UTERUS     ESOPHAGOGASTRODUODENOSCOPY (EGD) WITH PROPOFOL N/A 07/29/2021   Procedure: ESOPHAGOGASTRODUODENOSCOPY (EGD) WITH PROPOFOL;  Surgeon: Dolores Frame, MD;  Location: [facility] ENDO SUITE;  Service: Gastroenterology;  Laterality: N/A;  1235 ASA 2   HEMOSTASIS CONTROL  05/28/2022   Procedure: HEMOSTASIS CONTROL;  Surgeon: Omar Person, MD;  Location: [facility] ENDOSCOPY;  Service: Pulmonary;;   IR IMAGING GUIDED PORT INSERTION  08/12/2022   NECK SURGERY  1980's   THORACOTOMY Right 09/21/2013   Procedure: THORACOTOMY MAJOR;  Surgeon: Alleen Borne, MD;  Location: [facility] OR;  Service: Thoracic;  Laterality: Right;   UTERINE FIBROID SURGERY  2012  WEDGE RESECTION Right 09/21/2013   Procedure: RIGHT UPPER LOBE WEDGE RESECTION;  Surgeon: Alleen Borne, MD;  Location: [facility] OR;  Service: Thoracic;  Laterality: Right;    Social History: Social History   Socioeconomic History   Marital status: Married    Spouse name: Not on file   Number of children: Not on file   Years of education: Not on file   Highest education level: Not on file  Occupational History   Occupation: clerical    Employer: [employer]  Tobacco Use   Smoking status: Former    Packs/day: 1.00    Years: 35.00    Additional pack years: 0.00    Total pack years: 35.00    Types: Cigarettes    Quit date: 03/31/2007    Years since quitting: 15.3    Passive exposure: Past   Smokeless tobacco: Never  Vaping Use   Vaping Use: Never used  Substance and Sexual Activity   Alcohol use: Yes    Comment: occassional about 3 drinks per year   Drug use: No   Sexual activity: Never  Other Topics Concern   Not on file  Social History  Narrative   Not on file   Social Determinants of Health   Financial Resource Strain: Not on file  Food Insecurity: No Food Insecurity (08/06/2022)   Hunger Vital Sign    Worried About Running Out of Food in the Last Year: Never true    Ran Out of Food in the Last Year: Never true  Transportation Needs: No Transportation Needs (08/06/2022)   PRAPARE - Administrator, Civil Service (Medical): No    Lack of Transportation (Non-Medical): No  Physical Activity: Not on file  Stress: Not on file  Social Connections: Not on file  Intimate Partner Violence: Not At Risk (08/06/2022)   Humiliation, Afraid, Rape, and Kick questionnaire    Fear of Current or Ex-Partner: No    Emotionally Abused: No    Physically Abused: No    Sexually Abused: No    Family History: Family History  Problem Relation Age of Onset   Brain cancer Mother        brain cancer   Emphysema Father    Diabetes Son    Heart disease Paternal Grandfather    Breast cancer Maternal Aunt     Current Medications:  Current Outpatient Medications:    Calcium Carbonate Antacid (TUMS PO), Take 500-1,000 mg by mouth 3 (three) times daily as needed (indigestion/heartburn.)., Disp: , Rfl:    Cholecalciferol (VITAMIN D-3) 25 MCG (1000 UT) CAPS, Take 1,000 Units by mouth in the morning., Disp: , Rfl:    Coenzyme Q10 100 MG TABS, Take 100 mg by mouth in the morning., Disp: , Rfl:    ferrous sulfate 325 (65 FE) MG tablet, Take 1 tablet (325 mg total) by mouth daily., Disp: 30 tablet, Rfl: 3   lidocaine-prilocaine (EMLA) cream, Apply 1 Application topically as needed., Disp: 30 g, Rfl: 1   lidocaine-prilocaine (EMLA) cream, Apply to affected area once, Disp: 30 g, Rfl: 3   metoCLOPramide (REGLAN) 5 MG tablet, Take 1 tablet (5 mg total) by mouth 3 (three) times daily before meals., Disp: 180 tablet, Rfl: 3   Multiple Vitamin (MULTIVITAMIN WITH MINERALS) TABS tablet, Take 1 tablet by mouth in the morning., Disp: , Rfl:     Probiotic Product (PROBIOTIC DAILY PO), Take 1 capsule by mouth in the morning., Disp: , Rfl:    prochlorperazine (COMPAZINE)  10 MG tablet, Take 1 tablet (10 mg total) by mouth every 6 (six) hours as needed for nausea or vomiting., Disp: 30 tablet, Rfl: 3   prochlorperazine (COMPAZINE) 10 MG tablet, Take 1 tablet (10 mg total) by mouth every 6 (six) hours as needed for nausea or vomiting., Disp: 60 tablet, Rfl: 4   rosuvastatin (CRESTOR) 20 MG tablet, Take 1 tablet (20 mg total) by mouth daily., Disp: 90 tablet, Rfl: 3   tretinoin (RETIN-A) 0.1 % cream, Apply 1 application  topically at bedtime., Disp: , Rfl:    Turmeric Curcumin 500 MG CAPS, Take 500 mg by mouth in the morning., Disp: , Rfl:  No current facility-administered medications for this visit.  Facility-Administered Medications Ordered in Other Visits:    sodium chloride flush (NS) 0.9 % injection 10 mL, 10 mL, Intracatheter, PRN, Doreatha Massed, MD, 10 mL at 08/17/22 1448   Allergies: Allergies  Allergen Reactions   Azithromycin Shortness Of Breath and Rash    Took Tussionex at same time Jan 2013.   Tussionex Pennkinetic Er [Hydrocod Poli-Chlorphe Poli Er] Shortness Of Breath and Rash    Took Azithromycin at same time Jan 2013   Omeprazole Rash    REVIEW OF SYSTEMS:   Review of Systems  Constitutional:  Negative for chills, fatigue and fever.  HENT:   Negative for lump/mass, mouth sores, nosebleeds, sore throat and trouble swallowing.   Eyes:  Negative for eye problems.  Respiratory:  Negative for cough and shortness of breath.   Cardiovascular:  Negative for chest pain, leg swelling and palpitations.  Gastrointestinal:  Positive for constipation, nausea and vomiting. Negative for abdominal pain and diarrhea.  Genitourinary:  Negative for bladder incontinence, difficulty urinating, dysuria, frequency, hematuria and nocturia.   Musculoskeletal:  Negative for arthralgias, back pain, flank pain, myalgias and neck pain.   Skin:  Negative for itching and rash.  Neurological:  Negative for dizziness, headaches and numbness.  Hematological:  Does not bruise/bleed easily.  Psychiatric/Behavioral:  Positive for sleep disturbance. Negative for depression and suicidal ideas. The patient is not nervous/anxious.   All other systems reviewed and are negative.    VITALS:   There were no vitals taken for this visit.  Wt Readings from Last 3 Encounters:  08/17/22 96 lb 9.6 oz (43.8 kg)  08/12/22 98 lb (44.5 kg)  08/06/22 97 lb 14.4 oz (44.4 kg)    There is no height or weight on file to calculate BMI.  Performance status (ECOG): 1 - Symptomatic but completely ambulatory  PHYSICAL EXAM:   Physical Exam Vitals and nursing note reviewed. Exam conducted with a chaperone present.  Constitutional:      Appearance: Normal appearance.  Cardiovascular:     Rate and Rhythm: Normal rate and regular rhythm.     Pulses: Normal pulses.     Heart sounds: Normal heart sounds.  Pulmonary:     Effort: Pulmonary effort is normal.     Breath sounds: Normal breath sounds.  Abdominal:     Palpations: Abdomen is soft. There is no hepatomegaly, splenomegaly or mass.     Tenderness: There is no abdominal tenderness.  Musculoskeletal:     Right lower leg: No edema.     Left lower leg: No edema.  Lymphadenopathy:     Cervical: No cervical adenopathy.     Right cervical: No superficial, deep or posterior cervical adenopathy.    Left cervical: No superficial, deep or posterior cervical adenopathy.     Upper Body:  Right upper body: No supraclavicular or axillary adenopathy.     Left upper body: No supraclavicular or axillary adenopathy.  Neurological:     General: No focal deficit present.     Mental Status: She is alert and oriented to person, place, and time.  Psychiatric:        Mood and Affect: Mood normal.        Behavior: Behavior normal.     LABS:      Latest Ref Rng & Units 08/17/2022    8:37 AM 08/03/2022     9:17 AM 05/25/2022    2:17 PM  CBC  WBC 4.0 - 10.5 K/uL 4.8  5.5  5.7   Hemoglobin 12.0 - 15.0 g/dL 16.1  09.6  04.5   Hematocrit 36.0 - 46.0 % 31.6  34.4  33.5   Platelets 150 - 400 K/uL 117  128  139       Latest Ref Rng & Units 08/17/2022    8:37 AM 08/03/2022    9:16 AM 05/25/2022    2:17 PM  CMP  Glucose 70 - 99 mg/dL 91  93  82   BUN 8 - 23 mg/dL 19  15  11    Creatinine 0.44 - 1.00 mg/dL 4.09  8.11  9.14   Sodium 135 - 145 mmol/L 137  136  140   Potassium 3.5 - 5.1 mmol/L 3.4  3.5  3.9   Chloride 98 - 111 mmol/L 104  102  103   CO2 22 - 32 mmol/L 28  28  31    Calcium 8.9 - 10.3 mg/dL 9.5  9.7  9.7   Total Protein 6.5 - 8.1 g/dL 6.4  7.1  7.2   Total Bilirubin 0.3 - 1.2 mg/dL 0.9  1.0  0.5   Alkaline Phos 38 - 126 U/L 61  65  80   AST 15 - 41 U/L 15  14  13    ALT 0 - 44 U/L 8  9  6       No results found for: "CEA1", "CEA" / No results found for: "CEA1", "CEA" No results found for: "PSA1" No results found for: "NWG956" No results found for: "CAN125"  No results found for: "TOTALPROTELP", "ALBUMINELP", "A1GS", "A2GS", "BETS", "BETA2SER", "GAMS", "MSPIKE", "SPEI" Lab Results  Component Value Date   TIBC 238 (L) 08/17/2022   TIBC 149 (L) 10/08/2021   TIBC 159 (L) 04/10/2011   FERRITIN 296 08/17/2022   FERRITIN 675 (H) 10/08/2021   FERRITIN 1,680 (H) 04/10/2011   IRONPCTSAT 21 08/17/2022   IRONPCTSAT 9 (L) 10/08/2021   IRONPCTSAT 18 (L) 04/10/2011   No results found for: "LDH"   STUDIES:   MR Brain W Wo Contrast  Result Date: 08/17/2022 CLINICAL DATA:  77 year old female with lung cancer.  Restaging. EXAM: MRI HEAD WITHOUT AND WITH CONTRAST TECHNIQUE: Multiplanar, multiecho pulse sequences of the brain and surrounding structures were obtained without and with intravenous contrast. CONTRAST:  4.84mL GADAVIST GADOBUTROL 1 MMOL/ML IV SOLN COMPARISON:  Brain MRI 10/13/2013. FINDINGS: Brain: No midline shift, mass effect, or evidence of intracranial mass lesion. No  abnormal enhancement identified. No dural thickening identified. No restricted diffusion or evidence of acute infarction. However, chronic lacunar infarcts of the right basal ganglia and central pons are new from the 2015 MRI. And now numerous small chronic microhemorrhages are scattered throughout both cerebral hemispheres (versus fewer than 6 evident on T2 * in 2015. See series 10, images 26, 34 today. Brainstem and fairly numerous cerebellar  chronic microhemorrhages also. But background gray and white matter signal is otherwise largely stable since 2015. Mild for age other nonspecific cerebral white matter T2 and FLAIR signal changes. No ventriculomegaly, extra-axial collection or acute intracranial hemorrhage. Cervicomedullary junction and pituitary are within normal limits. Vascular: Major intracranial vascular flow voids are stable since 2015. Major dural venous sinuses are enhancing following contrast and appear to be patent. Skull and upper cervical spine: Partially visible cervical spine degeneration at C5-C6. Probably mild degenerative spinal stenosis there. Normal background bone marrow signal. Sinuses/Orbits: Postoperative changes to both globes since 2015. Paranasal Visualized paranasal sinuses and mastoids are stable and well aerated. Other: Visible internal auditory structures appear normal. Visible scalp and face appear negative aside from evidence of previous left parotidectomy. IMPRESSION: 1. No metastatic disease or acute intracranial abnormality. 2. But progression of small vessel chronic ischemic disease since 2015, and there are now numerous chronic microhemorrhages scattered throughout the brain - suggesting Amyloid Angiopathy. Electronically Signed   By: Odessa Fleming M.D.   On: 08/17/2022 08:18   IR IMAGING GUIDED PORT INSERTION  Result Date: 08/12/2022 INDICATION: SCLC, chemotherapy administration EXAM: IMPLANTED PORT A CATH PLACEMENT WITH ULTRASOUND AND FLUOROSCOPIC GUIDANCE MEDICATIONS:  None ANESTHESIA/SEDATION: Moderate (conscious) sedation was employed during this procedure. A total of Versed 1 mg and Fentanyl 50 mcg was administered intravenously. Moderate Sedation Time: 22 minutes. The patient's level of consciousness and vital signs were monitored continuously by radiology nursing throughout the procedure under my direct supervision. FLUOROSCOPY TIME:  Fluoroscopic dose; 0 mGy COMPLICATIONS: None immediate. PROCEDURE: The procedure, risks, benefits, and alternatives were explained to the patient. Questions regarding the procedure were encouraged and answered. The patient understands and consents to the procedure. The RIGHT neck and chest were prepped with chlorhexidine in a sterile fashion, and a sterile drape was applied covering the operative field. Maximum barrier sterile technique with sterile gowns and gloves were used for the procedure. A timeout was performed prior to the initiation of the procedure. Local anesthesia was provided with 1% lidocaine with epinephrine. After creating a small venotomy incision, a micropuncture kit was utilized to access the internal jugular vein under direct, real-time ultrasound guidance. Ultrasound image documentation was performed. The microwire was kinked to measure appropriate catheter length. A subcutaneous port pocket was then created along the upper chest wall utilizing a combination of sharp and blunt dissection. The pocket was irrigated with sterile saline. A single lumen ISP power injectable port was chosen for placement. The 8 Fr catheter was tunneled from the port pocket site to the venotomy incision. The port was placed in the pocket. The external catheter was trimmed to appropriate length. At the venotomy, an 8 Fr peel-away sheath was placed over a guidewire under fluoroscopic guidance. The catheter was then placed through the sheath and the sheath was removed. Final catheter positioning was confirmed and documented with a fluoroscopic spot  radiograph. The port was accessed with a Huber needle, aspirated and flushed with heparinized saline. The port pocket incision was closed with interrupted 3-0 Vicryl suture then Dermabond was applied, including at the venotomy incision. Dressings were placed. The patient tolerated the procedure well without immediate post procedural complication. IMPRESSION: Successful placement of a RIGHT internal jugular approach power injectable Port-A-Cath. The tip of the catheter is positioned at the superior cavo-atrial junction. The catheter is ready for immediate use. Roanna Banning, MD [facility] Electronically Signed   By: Roanna Banning M.D.   On: 08/12/2022 14:24  CT Chest W Contrast  Result Date: 08/05/2022 CLINICAL DATA:  Small cell lung cancer.  * Tracking Code: BO * EXAM: CT CHEST WITH CONTRAST TECHNIQUE: Multidetector CT imaging of the chest was performed during intravenous contrast administration. RADIATION DOSE REDUCTION: This exam was performed according to the departmental dose-optimization program which includes automated exposure control, adjustment of the mA and/or kV according to patient size and/or use of iterative reconstruction technique. CONTRAST:  75mL OMNIPAQUE IOHEXOL 300 MG/ML  SOLN COMPARISON:  05/25/2022 and PET 04/23/2022. FINDINGS: Cardiovascular: Atherosclerotic calcification of the aorta. Heart size normal. No pericardial effusion. Mediastinum/Nodes: No pathologically enlarged mediastinal, right hilar or axillary lymph nodes. Left hilum is difficult to evaluate due to collapse/consolidation. Esophagus is dilated and contains food debris throughout. Lungs/Pleura: Centrilobular emphysema. Postoperative scarring along the minor fissure. 4 mm apical segment right upper lobe nodule (4/37), new. Post radiation collapse/consolidation the medial upper left hemithorax, with note of associated abnormal hypermetabolism in anterior left upper lobe  on 04/23/2022. New nodule in the superior segment left lower lobe measures 8 mm (7 x 8 mm, 4/61). There are additional new nodular densities in the inferior left lower lobe measuring up to 4 mm (4/127). No pleural fluid. Airway is otherwise unremarkable. Upper Abdomen: Faint 7 mm hypodense lesion in the left hepatic lobe (2/142). Comparison with 05/25/2022 is difficult due to lack of IV contrast on that study. Visualized portions of the liver, gallbladder and adrenal glands are otherwise unremarkable. Hyperdense lesion off the upper pole right kidney and low-attenuation lesions in the left kidney. No specific follow-up necessary. Visualized portions of the spleen, pancreas, stomach and bowel are grossly unremarkable. Periportal adenopathy measures up to 2.3 x 2.6 cm (2/157), partially imaged. Musculoskeletal: Degenerative changes in the spine. Minimal retrolisthesis of L1 on L2. Peripherally sclerotic lucent lesion in the T10 vertebral body (7/83), similar. No new lesions. IMPRESSION: 1. Post radiation scarring in the medial left upper lobe with abnormal hypermetabolism seen in the anterior left upper lobe on 04/23/2022, compatible with residual/recurrent disease. CT appearance is similar. 2. Scattered new nodular densities in the lungs bilaterally, largest in the left lower lobe, compatible with progressive metastatic disease. 3. New subcentimeter hypodense lesion in the left hepatic lobe with bulky periportal adenopathy, indicative of new metastatic disease. 4. Patulous debris-filled esophagus, possibly due to achalasia. 5.  Aortic atherosclerosis (ICD10-I70.0). 6. Emphysema (ICD10-J43.9). Electronically Signed   By: Leanna Battles M.D.   On: 08/05/2022 13:42

## 2022-08-17 NOTE — Progress Notes (Signed)
Patient presents today for D1C1 Imfinzi/Carboplatin/Etoposide infusion per providers order.  Vital signs and labs reviewed by MD.  Message received from Chapman Moss RN/Dr. Ellin Saba patient okay for treatment.  Per MD order patient will receive 2 Grams Magnesium IV with treatment.  Treatment given today per MD orders.  Stable during infusion without adverse affects.  Vital signs stable.  No complaints at this time.  Discharge from clinic ambulatory in stable condition.  Alert and oriented X 3.  Follow up with [facility] as scheduled.

## 2022-08-18 ENCOUNTER — Inpatient Hospital Stay: Payer: Medicare PPO

## 2022-08-18 VITALS — BP 130/63 | HR 84 | Temp 97.2°F | Resp 18

## 2022-08-18 DIAGNOSIS — Z803 Family history of malignant neoplasm of breast: Secondary | ICD-10-CM | POA: Diagnosis not present

## 2022-08-18 DIAGNOSIS — C3490 Malignant neoplasm of unspecified part of unspecified bronchus or lung: Secondary | ICD-10-CM

## 2022-08-18 DIAGNOSIS — R634 Abnormal weight loss: Secondary | ICD-10-CM | POA: Diagnosis not present

## 2022-08-18 DIAGNOSIS — C3411 Malignant neoplasm of upper lobe, right bronchus or lung: Secondary | ICD-10-CM | POA: Diagnosis not present

## 2022-08-18 DIAGNOSIS — K3184 Gastroparesis: Secondary | ICD-10-CM | POA: Diagnosis not present

## 2022-08-18 DIAGNOSIS — Z5189 Encounter for other specified aftercare: Secondary | ICD-10-CM | POA: Diagnosis not present

## 2022-08-18 DIAGNOSIS — Z5111 Encounter for antineoplastic chemotherapy: Secondary | ICD-10-CM | POA: Diagnosis not present

## 2022-08-18 DIAGNOSIS — Z87891 Personal history of nicotine dependence: Secondary | ICD-10-CM | POA: Diagnosis not present

## 2022-08-18 DIAGNOSIS — Z79899 Other long term (current) drug therapy: Secondary | ICD-10-CM | POA: Diagnosis not present

## 2022-08-18 LAB — T4: T4, Total: 8.6 ug/dL (ref 4.5–12.0)

## 2022-08-18 MED ORDER — SODIUM CHLORIDE 0.9% FLUSH
10.0000 mL | INTRAVENOUS | Status: DC | PRN
  Administered 2022-08-18: 10 mL

## 2022-08-18 MED ORDER — HEPARIN SOD (PORK) LOCK FLUSH 100 UNIT/ML IV SOLN
500.0000 [IU] | Freq: Once | INTRAVENOUS | Status: AC | PRN
  Administered 2022-08-18: 500 [IU]

## 2022-08-18 MED ORDER — SODIUM CHLORIDE 0.9 % IV SOLN
10.0000 mg | Freq: Once | INTRAVENOUS | Status: AC
  Administered 2022-08-18: 10 mg via INTRAVENOUS
  Filled 2022-08-18: qty 10

## 2022-08-18 MED ORDER — SODIUM CHLORIDE 0.9 % IV SOLN: Freq: Once | INTRAVENOUS | Status: AC

## 2022-08-18 MED ORDER — SODIUM CHLORIDE 0.9 % IV SOLN
80.0000 mg/m2 | Freq: Once | INTRAVENOUS | Status: AC
  Administered 2022-08-18: 112 mg via INTRAVENOUS
  Filled 2022-08-18: qty 5.6

## 2022-08-18 NOTE — Progress Notes (Signed)
Patient presents today for Etoposide infusion per providers order.  Vital signs within parameters for treatment.  Patient has no new complaints at this time.  Treatment given today per MD orders.  Stable during infusion without adverse affects.  Vital signs stable.  No complaints at this time.  Discharge from clinic ambulatory in stable condition.  Alert and oriented X 3.  Follow up with [facility] as scheduled.  

## 2022-08-18 NOTE — Patient Instructions (Signed)
[facility]  Discharge Instructions: Thank you for choosing [facility] to provide your oncology and hematology care.  If you have a lab appointment with the Cancer Center - please note that after April 8th, 2024, all labs will be drawn in the cancer center.  You do not have to check in or register with the main entrance as you have in the past but will complete your check-in in the cancer center.  Wear comfortable clothing and clothing appropriate for easy access to any Portacath or PICC line.   We strive to give you quality time with your provider. You may need to reschedule your appointment if you arrive late (15 or more minutes).  Arriving late affects you and other patients whose appointments are after yours.  Also, if you miss three or more appointments without notifying the office, you may be dismissed from the clinic at the provider's discretion.      For prescription refill requests, have your pharmacy contact our office and allow 72 hours for refills to be completed.    Today you received the following chemotherapy and/or immunotherapy agents Etoposide      To help prevent nausea and vomiting after your treatment, we encourage you to take your nausea medication as directed.  BELOW ARE SYMPTOMS THAT SHOULD BE REPORTED IMMEDIATELY: *FEVER GREATER THAN 100.4 F (38 C) OR HIGHER *CHILLS OR SWEATING *NAUSEA AND VOMITING THAT IS NOT CONTROLLED WITH YOUR NAUSEA MEDICATION *UNUSUAL SHORTNESS OF BREATH *UNUSUAL BRUISING OR BLEEDING *URINARY PROBLEMS (pain or burning when urinating, or frequent urination) *BOWEL PROBLEMS (unusual diarrhea, constipation, pain near the anus) TENDERNESS IN MOUTH AND THROAT WITH OR WITHOUT PRESENCE OF ULCERS (sore throat, sores in mouth, or a toothache) UNUSUAL RASH, SWELLING OR PAIN  UNUSUAL VAGINAL DISCHARGE OR ITCHING   Items with * indicate a potential emergency and should be followed up as soon as possible or go to the  Emergency Department if any problems should occur.  Please show the CHEMOTHERAPY ALERT CARD or IMMUNOTHERAPY ALERT CARD at check-in to the Emergency Department and triage nurse.  Should you have questions after your visit or need to cancel or reschedule your appointment, please contact [facility] [phone]  and follow the prompts.  Office hours are 8:00 a.m. to 4:30 p.m. Monday - Friday. Please note that voicemails left after 4:00 p.m. may not be returned until the following business day.  We are closed weekends and major holidays. You have access to a nurse at all times for urgent questions. Please call the main number to the clinic [phone] and follow the prompts.  For any non-urgent questions, you may also contact your provider using MyChart. We now offer e-Visits for anyone 18 and older to request care online for non-urgent symptoms. For details visit [email or web address].   Also download the MyChart app! Go to the app store, search "MyChart", open the app, select [place] [facility], and log in with your MyChart username and password.   

## 2022-08-19 ENCOUNTER — Inpatient Hospital Stay: Payer: Medicare PPO

## 2022-08-19 VITALS — BP 128/74 | HR 79 | Temp 98.1°F | Resp 18

## 2022-08-19 DIAGNOSIS — Z5111 Encounter for antineoplastic chemotherapy: Secondary | ICD-10-CM | POA: Diagnosis not present

## 2022-08-19 DIAGNOSIS — Z79899 Other long term (current) drug therapy: Secondary | ICD-10-CM | POA: Diagnosis not present

## 2022-08-19 DIAGNOSIS — C3411 Malignant neoplasm of upper lobe, right bronchus or lung: Secondary | ICD-10-CM | POA: Diagnosis not present

## 2022-08-19 DIAGNOSIS — R634 Abnormal weight loss: Secondary | ICD-10-CM | POA: Diagnosis not present

## 2022-08-19 DIAGNOSIS — Z87891 Personal history of nicotine dependence: Secondary | ICD-10-CM | POA: Diagnosis not present

## 2022-08-19 DIAGNOSIS — C3490 Malignant neoplasm of unspecified part of unspecified bronchus or lung: Secondary | ICD-10-CM

## 2022-08-19 DIAGNOSIS — Z5189 Encounter for other specified aftercare: Secondary | ICD-10-CM | POA: Diagnosis not present

## 2022-08-19 DIAGNOSIS — Z803 Family history of malignant neoplasm of breast: Secondary | ICD-10-CM | POA: Diagnosis not present

## 2022-08-19 DIAGNOSIS — K3184 Gastroparesis: Secondary | ICD-10-CM | POA: Diagnosis not present

## 2022-08-19 MED ORDER — SODIUM CHLORIDE 0.9 % IV SOLN
10.0000 mg | Freq: Once | INTRAVENOUS | Status: AC
  Administered 2022-08-19: 10 mg via INTRAVENOUS
  Filled 2022-08-19: qty 10

## 2022-08-19 MED ORDER — SODIUM CHLORIDE 0.9 % IV SOLN
80.0000 mg/m2 | Freq: Once | INTRAVENOUS | Status: AC
  Administered 2022-08-19: 112 mg via INTRAVENOUS
  Filled 2022-08-19: qty 5.6

## 2022-08-19 MED ORDER — HEPARIN SOD (PORK) LOCK FLUSH 100 UNIT/ML IV SOLN
500.0000 [IU] | Freq: Once | INTRAVENOUS | Status: AC | PRN
  Administered 2022-08-19: 500 [IU]

## 2022-08-19 MED ORDER — SODIUM CHLORIDE 0.9 % IV SOLN: Freq: Once | INTRAVENOUS | Status: AC

## 2022-08-19 MED ORDER — SODIUM CHLORIDE 0.9% FLUSH
10.0000 mL | INTRAVENOUS | Status: DC | PRN
  Administered 2022-08-19: 10 mL

## 2022-08-19 NOTE — Patient Instructions (Signed)
[facility] [place]  Discharge Instructions: Thank you for choosing [facility] to provide your oncology and hematology care.  If you have a lab appointment with the Cancer Center - please note that after April 8th, 2024, all labs will be drawn in the cancer center.  You do not have to check in or register with the main entrance as you have in the past but will complete your check-in in the cancer center.  Wear comfortable clothing and clothing appropriate for easy access to any Portacath or PICC line.   We strive to give you quality time with your provider. You may need to reschedule your appointment if you arrive late (15 or more minutes).  Arriving late affects you and other patients whose appointments are after yours.  Also, if you miss three or more appointments without notifying the office, you may be dismissed from the clinic at the provider's discretion.      For prescription refill requests, have your pharmacy contact our office and allow 72 hours for refills to be completed.    Today you received the following chemotherapy and/or immunotherapy agents Etoposide.  Etoposide Injection What is this medication? ETOPOSIDE (e toe POE side) treats some types of cancer. It works by slowing down the growth of cancer cells. This medicine may be used for other purposes; ask your health care provider or pharmacist if you have questions. COMMON BRAND NAME(S): Etopophos, Toposar, VePesid What should I tell my care team before I take this medication? They need to know if you have any of these conditions: Infection Kidney disease Liver disease Low blood counts, such as low white cell, platelet, red cell counts An unusual or allergic reaction to etoposide, other medications, foods, dyes, or preservatives If you or your partner are pregnant or trying to get pregnant Breastfeeding How should I use this medication? This medication is injected into a vein. It is given by  your care team in a hospital or clinic setting. Talk to your care team about the use of this medication in children. Special care may be needed. Overdosage: If you think you have taken too much of this medicine contact a poison control center or emergency room at once. NOTE: This medicine is only for you. Do not share this medicine with others. What if I miss a dose? Keep appointments for follow-up doses. It is important not to miss your dose. Call your care team if you are unable to keep an appointment. What may interact with this medication? Warfarin This list may not describe all possible interactions. Give your health care provider a list of all the medicines, herbs, non-prescription drugs, or dietary supplements you use. Also tell them if you smoke, drink alcohol, or use illegal drugs. Some items may interact with your medicine. What should I watch for while using this medication? Your condition will be monitored carefully while you are receiving this medication. This medication may make you feel generally unwell. This is not uncommon as chemotherapy can affect healthy cells as well as cancer cells. Report any side effects. Continue your course of treatment even though you feel ill unless your care team tells you to stop. This medication can cause serious side effects. To reduce the risk, your care team may give you other medications to take before receiving this one. Be sure to follow the directions from your care team. This medication may increase your risk of getting an infection. Call your care team for advice if you get   a fever, chills, sore throat, or other symptoms of a cold or flu. Do not treat yourself. Try to avoid being around people who are sick. This medication may increase your risk to bruise or bleed. Call your care team if you notice any unusual bleeding. Talk to your care team about your risk of cancer. You may be more at risk for certain types of cancers if you take this  medication. Talk to your care team if you may be pregnant. Serious birth defects can occur if you take this medication during pregnancy and for 6 months after the last dose. You will need a negative pregnancy test before starting this medication. Contraception is recommended while taking this medication and for 6 months after the last dose. Your care team can help you find the option that works for you. If your partner can get pregnant, use a condom during sex while taking this medication and for 4 months after the last dose. Do not breastfeed while taking this medication. This medication may cause infertility. Talk to your care team if you are concerned about your fertility. What side effects may I notice from receiving this medication? Side effects that you should report to your care team as soon as possible: Allergic reactions--skin rash, itching, hives, swelling of the face, lips, tongue, or throat Infection--fever, chills, cough, sore throat, wounds that don't heal, pain or trouble when passing urine, general feeling of discomfort or being unwell Low red blood cell level--unusual weakness or fatigue, dizziness, headache, trouble breathing Unusual bruising or bleeding Side effects that usually do not require medical attention (report to your care team if they continue or are bothersome): Diarrhea Fatigue Hair loss Loss of appetite Nausea Vomiting This list may not describe all possible side effects. Call your doctor for medical advice about side effects. You may report side effects to FDA at 1-800-FDA-1088. Where should I keep my medication? This medication is given in a hospital or clinic. It will not be stored at home. NOTE: This sheet is a summary. It may not cover all possible information. If you have questions about this medicine, talk to your doctor, pharmacist, or health care provider.  2023 Elsevier/Gold Standard (2007-05-07 00:00:00)        To help prevent nausea and vomiting  after your treatment, we encourage you to take your nausea medication as directed.  BELOW ARE SYMPTOMS THAT SHOULD BE REPORTED IMMEDIATELY: *FEVER GREATER THAN 100.4 F (38 C) OR HIGHER *CHILLS OR SWEATING *NAUSEA AND VOMITING THAT IS NOT CONTROLLED WITH YOUR NAUSEA MEDICATION *UNUSUAL SHORTNESS OF BREATH *UNUSUAL BRUISING OR BLEEDING *URINARY PROBLEMS (pain or burning when urinating, or frequent urination) *BOWEL PROBLEMS (unusual diarrhea, constipation, pain near the anus) TENDERNESS IN MOUTH AND THROAT WITH OR WITHOUT PRESENCE OF ULCERS (sore throat, sores in mouth, or a toothache) UNUSUAL RASH, SWELLING OR PAIN  UNUSUAL VAGINAL DISCHARGE OR ITCHING   Items with * indicate a potential emergency and should be followed up as soon as possible or go to the Emergency Department if any problems should occur.  Please show the CHEMOTHERAPY ALERT CARD or IMMUNOTHERAPY ALERT CARD at check-in to the Emergency Department and triage nurse.  Should you have questions after your visit or need to cancel or reschedule your appointment, please contact [facility] [phone]  and follow the prompts.  Office hours are 8:00 a.m. to 4:30 p.m. Monday - Friday. Please note that voicemails left after 4:00 p.m. may not be returned until the following business day.    We are closed weekends and major holidays. You have access to a nurse at all times for urgent questions. Please call the main number to the clinic [phone] and follow the prompts.  For any non-urgent questions, you may also contact your provider using MyChart. We now offer e-Visits for anyone 18 and older to request care online for non-urgent symptoms. For details visit [email or web address].   Also download the MyChart app! Go to the app store, search "MyChart", open the app, select [facility], and log in with your MyChart username and password.  

## 2022-08-19 NOTE — Progress Notes (Signed)
Patient presents today for chemotherapy infusion, Etoposide.  Patient is in satisfactory condition with no new complaints voiced.  Vital signs are stable.  Labs reviewed and all labs are within treatment parameters.  We will proceed with treatment per MD orders.    Patient tolerated treatment well with no complaints voiced.  Patient left ambulatory in stable condition.  Vital signs stable at discharge.  Follow up as scheduled.

## 2022-08-20 ENCOUNTER — Telehealth: Payer: Self-pay | Admitting: *Deleted

## 2022-08-20 NOTE — Telephone Encounter (Signed)
24 hour chemotherapy follow-up call placed today. Pt denies, headaches, dizziness, nausea, and vomiting. Pt stated she felt good. Pt advised to call the clinic if needed. Pt verbalized understanding.

## 2022-08-21 ENCOUNTER — Inpatient Hospital Stay: Payer: Medicare PPO

## 2022-08-21 VITALS — BP 134/67 | HR 81 | Temp 97.7°F | Resp 18

## 2022-08-21 DIAGNOSIS — Z5111 Encounter for antineoplastic chemotherapy: Secondary | ICD-10-CM | POA: Diagnosis not present

## 2022-08-21 DIAGNOSIS — C3411 Malignant neoplasm of upper lobe, right bronchus or lung: Secondary | ICD-10-CM | POA: Diagnosis not present

## 2022-08-21 DIAGNOSIS — Z803 Family history of malignant neoplasm of breast: Secondary | ICD-10-CM | POA: Diagnosis not present

## 2022-08-21 DIAGNOSIS — Z5189 Encounter for other specified aftercare: Secondary | ICD-10-CM | POA: Diagnosis not present

## 2022-08-21 DIAGNOSIS — K3184 Gastroparesis: Secondary | ICD-10-CM | POA: Diagnosis not present

## 2022-08-21 DIAGNOSIS — Z87891 Personal history of nicotine dependence: Secondary | ICD-10-CM | POA: Diagnosis not present

## 2022-08-21 DIAGNOSIS — Z79899 Other long term (current) drug therapy: Secondary | ICD-10-CM | POA: Diagnosis not present

## 2022-08-21 DIAGNOSIS — R634 Abnormal weight loss: Secondary | ICD-10-CM | POA: Diagnosis not present

## 2022-08-21 DIAGNOSIS — C3490 Malignant neoplasm of unspecified part of unspecified bronchus or lung: Secondary | ICD-10-CM

## 2022-08-21 MED ORDER — PEGFILGRASTIM-CBQV 6 MG/0.6ML ~~LOC~~ SOSY
6.0000 mg | PREFILLED_SYRINGE | Freq: Once | SUBCUTANEOUS | Status: AC
  Administered 2022-08-21: 6 mg via SUBCUTANEOUS
  Filled 2022-08-21: qty 0.6

## 2022-08-21 NOTE — Progress Notes (Signed)
Patient tolerated Udenyca injection with no complaints voiced.  Site clean and dry with no bruising or swelling noted.  No complaints of pain.  Discharged with vital signs stable and no signs or symptoms of distress noted.  

## 2022-08-21 NOTE — Patient Instructions (Signed)
[facility] [place]  Discharge Instructions: Thank you for choosing [facility] to provide your oncology and hematology care.  If you have a lab appointment with the Cancer Center - please note that after April 8th, 2024, all labs will be drawn in the cancer center.  You do not have to check in or register with the main entrance as you have in the past but will complete your check-in in the cancer center.  Wear comfortable clothing and clothing appropriate for easy access to any Portacath or PICC line.   We strive to give you quality time with your provider. You may need to reschedule your appointment if you arrive late (15 or more minutes).  Arriving late affects you and other patients whose appointments are after yours.  Also, if you miss three or more appointments without notifying the office, you may be dismissed from the clinic at the provider's discretion.      For prescription refill requests, have your pharmacy contact our office and allow 72 hours for refills to be completed.    Today you received the following chemotherapy and/or immunotherapy agents Udenyca.  Pegfilgrastim Injection What is this medication? PEGFILGRASTIM (PEG fil gra stim) lowers the risk of infection in people who are receiving chemotherapy. It works by helping your body make more white blood cells, which protects your body from infection. It may also be used to help people who have been exposed to high doses of radiation. This medicine may be used for other purposes; ask your health care provider or pharmacist if you have questions. COMMON BRAND NAME(S): Fulphila, Fylnetra, Neulasta, Nyvepria, Stimufend, UDENYCA, UDENYCA ONBODY, Ziextenzo What should I tell my care team before I take this medication? They need to know if you have any of these conditions: Kidney disease Latex allergy Ongoing radiation therapy Sickle cell disease Skin reactions to acrylic adhesives (On-Body Injector  only) An unusual or allergic reaction to pegfilgrastim, filgrastim, other medications, foods, dyes, or preservatives Pregnant or trying to get pregnant Breast-feeding How should I use this medication? This medication is for injection under the skin. If you get this medication at home, you will be taught how to prepare and give the pre-filled syringe or how to use the On-body Injector. Refer to the patient Instructions for Use for detailed instructions. Use exactly as directed. Tell your care team immediately if you suspect that the On-body Injector may not have performed as intended or if you suspect the use of the On-body Injector resulted in a missed or partial dose. It is important that you put your used needles and syringes in a special sharps container. Do not put them in a trash can. If you do not have a sharps container, call your pharmacist or care team to get one. Talk to your care team about the use of this medication in children. While this medication may be prescribed for selected conditions, precautions do apply. Overdosage: If you think you have taken too much of this medicine contact a poison control center or emergency room at once. NOTE: This medicine is only for you. Do not share this medicine with others. What if I miss a dose? It is important not to miss your dose. Call your care team if you miss your dose. If you miss a dose due to an On-body Injector failure or leakage, a new dose should be administered as soon as possible using a single prefilled syringe for manual use. What may interact with this medication? Interactions   have not been studied. This list may not describe all possible interactions. Give your health care provider a list of all the medicines, herbs, non-prescription drugs, or dietary supplements you use. Also tell them if you smoke, drink alcohol, or use illegal drugs. Some items may interact with your medicine. What should I watch for while using this  medication? Your condition will be monitored carefully while you are receiving this medication. You may need blood work done while you are taking this medication. Talk to your care team about your risk of cancer. You may be more at risk for certain types of cancer if you take this medication. If you are going to need a MRI, CT scan, or other procedure, tell your care team that you are using this medication (On-Body Injector only). What side effects may I notice from receiving this medication? Side effects that you should report to your care team as soon as possible: Allergic reactions--skin rash, itching, hives, swelling of the face, lips, tongue, or throat Capillary leak syndrome--stomach or muscle pain, unusual weakness or fatigue, feeling faint or lightheaded, decrease in the amount of urine, swelling of the ankles, hands, or feet, trouble breathing High white blood cell level--fever, fatigue, trouble breathing, night sweats, change in vision, weight loss Inflammation of the aorta--fever, fatigue, back, chest, or stomach pain, severe headache Kidney injury (glomerulonephritis)--decrease in the amount of urine, red or dark Claudia [name] urine, foamy or bubbly urine, swelling of the ankles, hands, or feet Shortness of breath or trouble breathing Spleen injury--pain in upper left stomach or shoulder Unusual bruising or bleeding Side effects that usually do not require medical attention (report to your care team if they continue or are bothersome): Bone pain Pain in the hands or feet This list may not describe all possible side effects. Call your doctor for medical advice about side effects. You may report side effects to FDA at 1-800-FDA-1088. Where should I keep my medication? Keep out of the reach of children. If you are using this medication at home, you will be instructed on how to store it. Throw away any unused medication after the expiration date on the label. NOTE: This sheet is a summary. It  may not cover all possible information. If you have questions about this medicine, talk to your doctor, pharmacist, or health care provider.  2024 Elsevier/Gold Standard (2021-02-14 00:00:00)        To help prevent nausea and vomiting after your treatment, we encourage you to take your nausea medication as directed.  BELOW ARE SYMPTOMS THAT SHOULD BE REPORTED IMMEDIATELY: *FEVER GREATER THAN 100.4 F (38 C) OR HIGHER *CHILLS OR SWEATING *NAUSEA AND VOMITING THAT IS NOT CONTROLLED WITH YOUR NAUSEA MEDICATION *UNUSUAL SHORTNESS OF BREATH *UNUSUAL BRUISING OR BLEEDING *URINARY PROBLEMS (pain or burning when urinating, or frequent urination) *BOWEL PROBLEMS (unusual diarrhea, constipation, pain near the anus) TENDERNESS IN MOUTH AND THROAT WITH OR WITHOUT PRESENCE OF ULCERS (sore throat, sores in mouth, or a toothache) UNUSUAL RASH, SWELLING OR PAIN  UNUSUAL VAGINAL DISCHARGE OR ITCHING   Items with * indicate a potential emergency and should be followed up as soon as possible or go to the Emergency Department if any problems should occur.  Please show the CHEMOTHERAPY ALERT CARD or IMMUNOTHERAPY ALERT CARD at check-in to the Emergency Department and triage nurse.  Should you have questions after your visit or need to cancel or reschedule your appointment, please contact [facility] [phone]  and follow the prompts.  Office hours   are 8:00 a.m. to 4:30 p.m. Monday - Friday. Please note that voicemails left after 4:00 p.m. may not be returned until the following business day.  We are closed weekends and major holidays. You have access to a nurse at all times for urgent questions. Please call the main number to the clinic [phone] and follow the prompts.  For any non-urgent questions, you may also contact your provider using MyChart. We now offer e-Visits for anyone 18 and older to request care online for non-urgent symptoms. For details visit  [email or web address].   Also download the MyChart app! Go to the app store, search "MyChart", open the app, select [facility], and log in with your MyChart username and password.   

## 2022-09-01 ENCOUNTER — Other Ambulatory Visit: Payer: Self-pay

## 2022-09-01 DIAGNOSIS — D508 Other iron deficiency anemias: Secondary | ICD-10-CM

## 2022-09-01 DIAGNOSIS — C3490 Malignant neoplasm of unspecified part of unspecified bronchus or lung: Secondary | ICD-10-CM

## 2022-09-02 NOTE — Progress Notes (Signed)
[facility] [address], [place] [address]    Clinic Day:  09/03/2022  Referring physician: Ignatius Specking, MD  Patient Care Team: Ignatius Specking, MD as PCP - General (Internal Medicine) Pricilla Riffle, MD as PCP - Cardiology (Cardiology) Charna Elizabeth, MD as Consulting Physician (Gastroenterology) Doreatha Massed, MD as Medical Oncologist (Medical Oncology) Therese Sarah, RN as Oncology Nurse Navigator (Medical Oncology)   ASSESSMENT & PLAN:   Assessment: 1.  Recurrent extensive stage small cell lung cancer: - Patient seen at the request of Dr. Shirline Frees to get care closer to home. - 4 cycles of carboplatin and etoposide with concurrent radiation therapy for limited stage small cell lung cancer on 11/14/2009 - Status post PCI completed on 01/28/2010 - Wedge resection of the RUL by Dr. Lavinia Sharps on 09/20/2013, pathology consistent with small cell lung cancer. - 4 cycles of carboplatin and etoposide from 10/30/2013 through 01/08/2014 - PET scan on 04/23/2012: Marked hypermetabolism in the medial and anterior aspect of the left upper lobe.  Several hypermetabolic lung nodules in the left lung.  9 mm lesion in the left lower lobe.  No metastatic disease in the AP. - Bronchoscopy and FNA of the LUL nodule and LUL brushing and lavage with no malignant cells. - CT chest (08/03/2022): Scattered new nodular densities in the lungs bilaterally, largest in the left lower lobe.  New subcentimeter hypodense lesion in the left hepatic lobe 7 mm.  Periportal adenopathy 2.3 x 2.6 cm.   2.  Social/family history: - She moved back to [place] recently and lives with her husband.  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  She also worked at [employer] for 15 years.  Quit smoking in 2010 and smoked 1 pack/day for 30 years. - Mother had brain tumor.  Maternal aunt had breast cancer.    Plan: 1.  Recurrent extensive stage small cell lung cancer: - She has  port placed. - MRI of brain results from 08/14/2022: No metastatic disease.  Progression of small vessel ischemic disease. - PET scan from 08/13/2022 showed radiation changes in left upper lobe with focal hypermetabolism suggestive of residual recurrent tumor although this is improved from prior PET.  Dominant 8 mm subpleural nodule in the left lower lobe unchanged from recent CT and new from prior PET.  Metastatic disease not excluded although infection/inflammation is also possible.  Prior hypermetabolic nodules on PET have resolved.  No evidence of metastatic disease in the abdomen or pelvis. - Started back on chemo with carboplatin/etoposide and Imfinzi dose reduced secondary to her decreased p.o. intake from gastroparesis and history of poor tolerance with last chemotherapy on 08/17/2022.  She received [place], Imfinzi and etoposide on day 1 and Imfinzi on day 2 and day 3. -She is scheduled for cycle 2 of carbo/etoposide and Imfinzi on 09/07/2022. -Reports tolerating cycle 1 well except for some constipation and fatigue. -Reviewed labs from today which show hemoglobin of 7.2 with a platelet count of 38,000.  Potassium level 3.4 and magnesium level was 1.4. -Plan to give 1 unit of blood, 20 mill equivalents of potassium and 4 g of magnesium along with 1 liter NS.     2.  Gastroparesis: - She has lost 30 pounds in the last 1 year with vomiting.  She is drinking half can of fair life per day.  Will increase to 1 can per day. - Continue Reglan 5 mg 3 times daily before meals  and eat multiple small meals.  Continue follow-up with Dr. Levon Hedger.  3.  Hypomagnesemia: - Magnesium 1.5 on 08/17/2022 and she was given 4 g of magnesium IV. -Today magnesium level is 1.4.  Proceed with 4 g of IV magnesium and start her on oral magnesium supplements daily. - Recommend magnesium 250 mg daily.  If you develop diarrhea please let us know.  4.  Hypokalemia: -Potassium level 3.4 today.  Discussed giving 20 meq IV  potassium. -Discussed potassium rich foods.  5.  Anemia: -Secondary to chemo. - hemoglobin dropped from 10.4-7.2. -Recommend 1 unit of blood today. -Scheduled for recheck of labs prior to next chemo on 09/07/2022.  6.  Thrombocytopenia: -Secondary to chemo. -Count dropped from 117,000-38,000. - 1 unit PRBC per above.    Orders Placed This Encounter  Procedures   Informed Consent Details: Physician/Practitioner Attestation; Transcribe to consent form and obtain patient signature    Order Specific Question:   Physician/Practitioner attestation of informed consent for blood and or blood product transfusion    Answer:   I, the physician/practitioner, attest that I have discussed with the patient the benefits, risks, side effects, alternatives, likelihood of achieving goals and potential problems during recovery for the procedure that I have provided informed consent.    Order Specific Question:   Product(s)    Answer:   All Product(s)   I spent 25 minutes dedicated to the care of this patient (face-to-face and non-face-to-face) on the date of the encounter to include what is described in the assessment and plan.  Mauro Kaufmann, NP   6/6/202410:37 AM  CHIEF COMPLAINT:   Diagnosis: recurrent small cell lung cancer    Cancer Staging  Small cell lung cancer (HCC) Staging form: Lung, AJCC 7th Edition - Clinical: Limited Stage Small cell Lung Cancer - Signed by Si Gaul, MD on 05/10/2013    Prior Therapy: 1. Concurrent chemoradiation with carboplatin and etoposide, completed 11/14/2009.  2. Prophylactic cranial irradiation, completed 01/28/2010. 3. RUL wedge resection on 09/21/2013 (Dr. Laneta Simmers) 4. 4 cycles of carboplatin and etoposide, 10/30/2013 - 01/10/2014  Current Therapy:  Carboplatin, etoposide and durvalumab    HISTORY OF PRESENT ILLNESS:   Oncology History  Small cell lung cancer (HCC)  04/10/2011 Initial Diagnosis   Small cell lung cancer (HCC)   08/17/2022 -   Chemotherapy   Patient is on Treatment Plan : LUNG SMALL CELL EXTENSIVE STAGE Durvalumab + Carboplatin D1 + Etoposide D1-3 q21d x 4 Cycles / Durvalumab q28d        INTERVAL HISTORY:   Kwanisha is a 77 y.o. female presenting to clinic today for follow up of recurrent small cell lung cancer. She is here for follow-up after cycle 1 of carbo/etoposide plus Imfinzi given on 08/17/2022.   Had a port placed on 08/12/2022 and restaging PET scan on 08/13/2022 which showed radiation changes in left upper lobe with focal hypermetabolism suggestive of residual/recurrent tumor although this is improved from prior PET.  Dominant 8 mm subpleural nodule in left lower lobe is unchanged from recent CT scan findings from prior PET.  Metastatic disease, excluded although infection/inflammation is also possible.  No evidence of metastatic disease in the abdomen or pelvis.  She had a brain MRI on 08/14/2022 which did not reveal any metastatic disease.  Today, she states that she is doing well overall. Her appetite level is at 40%. Her energy level is at 85%.  Reports some fatigue towards the end of her treatment but otherwise has felt great.  Denies any nausea or vomiting.  Has had some constipation and used a laxative once which helped.  No signs of infection.  No fever.  PAST MEDICAL HISTORY:   Past Medical History: Past Medical History:  Diagnosis Date   Achalasia    Anemia    Arthritis    Bradycardia    mild,may be due to beta blocker therapy   COPD (chronic obstructive pulmonary disease) (HCC)    Gastroparesis    GERD (gastroesophageal reflux disease)    otc   Hypertension 06/01/2019   pt denies   Local recurrence of lung cancer (HCC) dx'd 07/2013   rt thoracotomy chemo comp 12/2013   Lung cancer (HCC) 03/30/2008   Dr. Arbutus Ped, finished chemo, sp radiation, left upper    Lymphocytic colitis    Pneumonia     Surgical History: Past Surgical History:  Procedure Laterality Date   BRONCHIAL BIOPSY   05/28/2022   Procedure: BRONCHIAL BIOPSIES;  Surgeon: Omar Person, MD;  Location: [facility] ENDOSCOPY;  Service: Pulmonary;;   BRONCHIAL BRUSHINGS  05/28/2022   Procedure: BRONCHIAL BRUSHINGS;  Surgeon: Omar Person, MD;  Location: [facility] ENDOSCOPY;  Service: Pulmonary;;   BRONCHIAL NEEDLE ASPIRATION BIOPSY  05/28/2022   Procedure: BRONCHIAL NEEDLE ASPIRATION BIOPSIES;  Surgeon: Omar Person, MD;  Location[facility] ENDOSCOPY;  Service: Pulmonary;;   BRONCHIAL WASHINGS  05/28/2022   Procedure: BRONCHIAL WASHINGS;  Surgeon: Omar Person, MD;  Location: [facility] ENDOSCOPY;  Service: Pulmonary;;   BRONCHOSCOPY  2011   DILATION AND CURETTAGE OF UTERUS     ESOPHAGOGASTRODUODENOSCOPY (EGD) WITH PROPOFOL N/A 07/29/2021   Procedure: ESOPHAGOGASTRODUODENOSCOPY (EGD) WITH PROPOFOL;  Surgeon: Dolores Frame, MD;  Location: [facility] ENDO SUITE;  Service: Gastroenterology;  Laterality: N/A;  1235 ASA 2   HEMOSTASIS CONTROL  05/28/2022   Procedure: HEMOSTASIS CONTROL;  Surgeon: Omar Person, MD;  Location: [facility] ENDOSCOPY;  Service: Pulmonary;;   IR IMAGING GUIDED PORT INSERTION  08/12/2022   NECK SURGERY  1980's   THORACOTOMY Right 09/21/2013   Procedure: THORACOTOMY MAJOR;  Surgeon: Alleen Borne, MD;  Location: [facility] OR;  Service: Thoracic;  Laterality: Right;   UTERINE FIBROID SURGERY  2012   WEDGE RESECTION Right 09/21/2013   Procedure: RIGHT UPPER LOBE WEDGE RESECTION;  Surgeon: Alleen Borne, MD;  Location: [facility] OR;  Service: Thoracic;  Laterality: Right;    Social History: Social History   Socioeconomic History   Marital status: Married    Spouse name: Not on file   Number of children: Not on file   Years of education: Not on file   Highest education level: Not on file  Occupational History   Occupation: clerical    Employer: [employer]  Tobacco Use   Smoking status: Former    Packs/day: 1.00    Years: 35.00    Additional pack years: 0.00    Total pack years: 35.00    Types:  Cigarettes    Quit date: 03/31/2007    Years since quitting: 15.4    Passive exposure: Past   Smokeless tobacco: Never  Vaping Use   Vaping Use: Never used  Substance and Sexual Activity   Alcohol use: Yes    Comment: occassional about 3 drinks per year   Drug use: No   Sexual activity: Never  Other Topics Concern   Not on file  Social History Narrative   Not on file   Social Determinants of Health   Financial Resource Strain: Not on file  Food  Insecurity: No Food Insecurity (08/06/2022)   Hunger Vital Sign    Worried About Running Out of Food in the Last Year: Never true    Ran Out of Food in the Last Year: Never true  Transportation Needs: No Transportation Needs (08/06/2022)   PRAPARE - Administrator, Civil Service (Medical): No    Lack of Transportation (Non-Medical): No  Physical Activity: Not on file  Stress: Not on file  Social Connections: Not on file  Intimate Partner Violence: Not At Risk (08/06/2022)   Humiliation, Afraid, Rape, and Kick questionnaire    Fear of Current or Ex-Partner: No    Emotionally Abused: No    Physically Abused: No    Sexually Abused: No    Family History: Family History  Problem Relation Age of Onset   Brain cancer Mother        brain cancer   Emphysema Father    Diabetes Son    Heart disease Paternal Grandfather    Breast cancer Maternal Aunt     Current Medications:  Current Outpatient Medications:    Calcium Carbonate Antacid (TUMS PO), Take 500-1,000 mg by mouth 3 (three) times daily as needed (indigestion/heartburn.)., Disp: , Rfl:    Cholecalciferol (VITAMIN D-3) 25 MCG (1000 UT) CAPS, Take 1,000 Units by mouth in the morning., Disp: , Rfl:    Coenzyme Q10 100 MG TABS, Take 100 mg by mouth in the morning., Disp: , Rfl:    ferrous sulfate 325 (65 FE) MG tablet, Take 1 tablet (325 mg total) by mouth daily., Disp: 30 tablet, Rfl: 3   lidocaine-prilocaine (EMLA) cream, Apply 1 Application topically as needed., Disp: 30  g, Rfl: 1   lidocaine-prilocaine (EMLA) cream, Apply to affected area once, Disp: 30 g, Rfl: 3   metoCLOPramide (REGLAN) 5 MG tablet, Take 1 tablet (5 mg total) by mouth 3 (three) times daily before meals., Disp: 180 tablet, Rfl: 3   Multiple Vitamin (MULTIVITAMIN WITH MINERALS) TABS tablet, Take 1 tablet by mouth in the morning., Disp: , Rfl:    Probiotic Product (PROBIOTIC DAILY PO), Take 1 capsule by mouth in the morning., Disp: , Rfl:    prochlorperazine (COMPAZINE) 10 MG tablet, Take 1 tablet (10 mg total) by mouth every 6 (six) hours as needed for nausea or vomiting., Disp: 30 tablet, Rfl: 3   prochlorperazine (COMPAZINE) 10 MG tablet, Take 1 tablet (10 mg total) by mouth every 6 (six) hours as needed for nausea or vomiting., Disp: 60 tablet, Rfl: 4   rosuvastatin (CRESTOR) 20 MG tablet, Take 1 tablet (20 mg total) by mouth daily., Disp: 90 tablet, Rfl: 3   tretinoin (RETIN-A) 0.1 % cream, Apply 1 application  topically at bedtime., Disp: , Rfl:    Turmeric Curcumin 500 MG CAPS, Take 500 mg by mouth in the morning., Disp: , Rfl:  No current facility-administered medications for this visit.  Facility-Administered Medications Ordered in Other Visits:    0.9 %  sodium chloride infusion (Manually program via Guardrails IV Fluids), 250 mL, Intravenous, Once, Amandeep Hogston E, NP   0.9 % NaCl with KCl 20 mEq/ L  infusion, , Intravenous, Once, Yukie Bergeron, Victorino Dike E, NP   acetaminophen (TYLENOL) tablet 650 mg, 650 mg, Oral, Once, Sukhman Kocher, Renda Rolls, NP   diphenhydrAMINE (BENADRYL) capsule 25 mg, 25 mg, Oral, Once, Kylar Speelman E, NP   magnesium sulfate IVPB 4 g 100 mL, 4 g, Intravenous, Once, Hibah Odonnell, Renda Rolls, NP   Allergies: Allergies  Allergen Reactions  Azithromycin Shortness Of Breath and Rash    Took Tussionex at same time Jan 2013.   Tussionex Pennkinetic Er [Hydrocod Poli-Chlorphe Poli Er] Shortness Of Breath and Rash    Took Azithromycin at same time Jan 2013   Omeprazole Rash     REVIEW OF SYSTEMS:   Review of Systems  Constitutional:  Positive for fatigue. Negative for appetite change, chills, diaphoresis and fever.  Gastrointestinal:  Positive for constipation. Negative for abdominal pain, diarrhea, nausea and vomiting.  Musculoskeletal:  Negative for myalgias.  Psychiatric/Behavioral:  Positive for sleep disturbance (Falls asleep easily).      VITALS:   There were no vitals taken for this visit.  Wt Readings from Last 3 Encounters:  08/17/22 96 lb 9.6 oz (43.8 kg)  08/12/22 98 lb (44.5 kg)  08/06/22 97 lb 14.4 oz (44.4 kg)    There is no height or weight on file to calculate BMI.  Performance status (ECOG): 1 - Symptomatic but completely ambulatory  PHYSICAL EXAM:   Physical Exam Constitutional:      Appearance: Normal appearance.  HENT:     Head: Normocephalic and atraumatic.  Eyes:     Pupils: Pupils are equal, round, and reactive to light.  Cardiovascular:     Rate and Rhythm: Normal rate and regular rhythm.     Heart sounds: Normal heart sounds. No murmur heard. Pulmonary:     Effort: Pulmonary effort is normal.     Breath sounds: Normal breath sounds. No wheezing.  Abdominal:     General: Bowel sounds are normal. There is no distension.     Palpations: Abdomen is soft.     Tenderness: There is no abdominal tenderness.  Musculoskeletal:        General: Normal range of motion.     Cervical back: Normal range of motion.  Skin:    General: Skin is warm and dry.     Findings: No rash.  Neurological:     Mental Status: She is alert and oriented to person, place, and time.  Psychiatric:        Judgment: Judgment normal.     LABS:      Latest Ref Rng & Units 09/03/2022    9:01 AM 08/17/2022    8:37 AM 08/03/2022    9:17 AM  CBC  WBC 4.0 - 10.5 K/uL 6.7  4.8  5.5   Hemoglobin 12.0 - 15.0 g/dL 7.2  16.1  09.6   Hematocrit 36.0 - 46.0 % 21.8  31.6  34.4   Platelets 150 - 400 K/uL 38  117  128       Latest Ref Rng & Units  09/03/2022    9:01 AM 08/17/2022    8:37 AM 08/03/2022    9:16 AM  CMP  Glucose 70 - 99 mg/dL 94  91  93   BUN 8 - 23 mg/dL 11  19  15    Creatinine 0.44 - 1.00 mg/dL 0.45  4.09  8.11   Sodium 135 - 145 mmol/L 137  137  136   Potassium 3.5 - 5.1 mmol/L 3.4  3.4  3.5   Chloride 98 - 111 mmol/L 101  104  102   CO2 22 - 32 mmol/L 27  28  28    Calcium 8.9 - 10.3 mg/dL 9.1  9.5  9.7   Total Protein 6.5 - 8.1 g/dL 6.1  6.4  7.1   Total Bilirubin 0.3 - 1.2 mg/dL 0.4  0.9  1.0   Alkaline  Phos 38 - 126 U/L 70  61  65   AST 15 - 41 U/L 13  15  14    ALT 0 - 44 U/L 10  8  9       No results found for: "CEA1", "CEA" / No results found for: "CEA1", "CEA" No results found for: "PSA1" No results found for: "XBJ478" No results found for: "CAN125"  No results found for: "TOTALPROTELP", "ALBUMINELP", "A1GS", "A2GS", "BETS", "BETA2SER", "GAMS", "MSPIKE", "SPEI" Lab Results  Component Value Date   TIBC 238 (L) 08/17/2022   TIBC 149 (L) 10/08/2021   TIBC 159 (L) 04/10/2011   FERRITIN 296 08/17/2022   FERRITIN 675 (H) 10/08/2021   FERRITIN 1,680 (H) 04/10/2011   IRONPCTSAT 21 08/17/2022   IRONPCTSAT 9 (L) 10/08/2021   IRONPCTSAT 18 (L) 04/10/2011   No results found for: "LDH"   STUDIES:   NM PET Image Restag (PS) Skull Base To Thigh  Result Date: 08/18/2022 CLINICAL DATA:  Subsequent treatment strategy for recurrent small cell lung cancer. EXAM: NUCLEAR MEDICINE PET SKULL BASE TO THIGH TECHNIQUE: 4.9 mCi F-18 FDG was injected intravenously. Full-ring PET imaging was performed from the skull base to thigh after the radiotracer. CT data was obtained and used for attenuation correction and anatomic localization. Fasting blood glucose: 73 mg/dl COMPARISON:  CT chest dated 08/03/2022.  PET-CT dated 04/23/2022. FINDINGS: Mediastinal blood pool activity: SUV max 2.2 Liver activity: SUV max NA NECK: No hypermetabolic cervical lymphadenopathy. Incidental CT findings: None. CHEST: Radiation changes with volume  loss in the left upper lobe. Associated focal hypermetabolism in the anterior/inferior aspect of the collapsed right upper lobe, max SUV 7.1, previously 18.5. Dominant 8 mm subpleural nodule in the left lower lobe (series 7/image 38), max SUV 6.4, unchanged from recent CT and new from prior PET. Metastatic disease not excluded, although infection/inflammation is also possible. When compared to prior PET, the prior hypermetabolic medial left lower lobe and lateral left upper lobe nodules have resolved. Mild residual peribronchovascular nodularity at the left lung base measuring up to 4 mm (series 7/image 39), non FDG avid but beneath the size threshold for PET sensitivity, favored to be infectious/inflammatory. Status post right upper lobe wedge resection. Right chest port terminates at the cavoatrial junction. Incidental CT findings: Leftward cardiomediastinal shift. Mild atherosclerotic calcifications of the aortic arch. Fluid/debris within the mid/distal esophagus. ABDOMEN/PELVIS: No abnormal hypermetabolism in the liver, spleen, pancreas, or adrenal glands. No hypermetabolic abdominopelvic lymphadenopathy. Incidental CT findings: Mild layering gallbladder sludge versus noncalcified gallstones. Atherosclerotic calcifications of the abdominal aorta and branch vessels. SKELETON: No focal hypermetabolic activity to suggest skeletal metastasis. Incidental CT findings: Degenerative changes of the visualized thoracolumbar spine. IMPRESSION: Radiation changes in the left upper lobe with focal hypermetabolism suggestive of residual/recurrent tumor, although improved from prior PET. Dominant 8 mm subpleural nodule in the left lower lobe, unchanged from recent CT and new from prior PET. Metastatic disease not excluded, although infection/inflammation is also possible. Additional prior hypermetabolic nodules on PET have resolved. No evidence of metastatic disease in the abdomen/pelvis. Electronically Signed   By: Charline Bills M.D.   On: 08/18/2022 00:04   MR Brain W Wo Contrast  Result Date: 08/17/2022 CLINICAL DATA:  77 year old female with lung cancer.  Restaging. EXAM: MRI HEAD WITHOUT AND WITH CONTRAST TECHNIQUE: Multiplanar, multiecho pulse sequences of the brain and surrounding structures were obtained without and with intravenous contrast. CONTRAST:  4.64mL GADAVIST GADOBUTROL 1 MMOL/ML IV SOLN COMPARISON:  Brain MRI 10/13/2013. FINDINGS: Brain: No  midline shift, mass effect, or evidence of intracranial mass lesion. No abnormal enhancement identified. No dural thickening identified. No restricted diffusion or evidence of acute infarction. However, chronic lacunar infarcts of the right basal ganglia and central pons are new from the 2015 MRI. And now numerous small chronic microhemorrhages are scattered throughout both cerebral hemispheres (versus fewer than 6 evident on T2 * in 2015. See series 10, images 26, 34 today. Brainstem and fairly numerous cerebellar chronic microhemorrhages also. But background gray and white matter signal is otherwise largely stable since 2015. Mild for age other nonspecific cerebral white matter T2 and FLAIR signal changes. No ventriculomegaly, extra-axial collection or acute intracranial hemorrhage. Cervicomedullary junction and pituitary are within normal limits. Vascular: Major intracranial vascular flow voids are stable since 2015. Major dural venous sinuses are enhancing following contrast and appear to be patent. Skull and upper cervical spine: Partially visible cervical spine degeneration at C5-C6. Probably mild degenerative spinal stenosis there. Normal background bone marrow signal. Sinuses/Orbits: Postoperative changes to both globes since 2015. Paranasal Visualized paranasal sinuses and mastoids are stable and well aerated. Other: Visible internal auditory structures appear normal. Visible scalp and face appear negative aside from evidence of previous left parotidectomy.  IMPRESSION: 1. No metastatic disease or acute intracranial abnormality. 2. But progression of small vessel chronic ischemic disease since 2015, and there are now numerous chronic microhemorrhages scattered throughout the brain - suggesting Amyloid Angiopathy. Electronically Signed   By: Odessa Fleming M.D.   On: 08/17/2022 08:18   IR IMAGING GUIDED PORT INSERTION  Result Date: 08/12/2022 INDICATION: SCLC, chemotherapy administration EXAM: IMPLANTED PORT A CATH PLACEMENT WITH ULTRASOUND AND FLUOROSCOPIC GUIDANCE MEDICATIONS: None ANESTHESIA/SEDATION: Moderate (conscious) sedation was employed during this procedure. A total of Versed 1 mg and Fentanyl 50 mcg was administered intravenously. Moderate Sedation Time: 22 minutes. The patient's level of consciousness and vital signs were monitored continuously by radiology nursing throughout the procedure under my direct supervision. FLUOROSCOPY TIME:  Fluoroscopic dose; 0 mGy COMPLICATIONS: None immediate. PROCEDURE: The procedure, risks, benefits, and alternatives were explained to the patient. Questions regarding the procedure were encouraged and answered. The patient understands and consents to the procedure. The RIGHT neck and chest were prepped with chlorhexidine in a sterile fashion, and a sterile drape was applied covering the operative field. Maximum barrier sterile technique with sterile gowns and gloves were used for the procedure. A timeout was performed prior to the initiation of the procedure. Local anesthesia was provided with 1% lidocaine with epinephrine. After creating a small venotomy incision, a micropuncture kit was utilized to access the internal jugular vein under direct, real-time ultrasound guidance. Ultrasound image documentation was performed. The microwire was kinked to measure appropriate catheter length. A subcutaneous port pocket was then created along the upper chest wall utilizing a combination of sharp and blunt dissection. The pocket was  irrigated with sterile saline. A single lumen ISP power injectable port was chosen for placement. The 8 Fr catheter was tunneled from the port pocket site to the venotomy incision. The port was placed in the pocket. The external catheter was trimmed to appropriate length. At the venotomy, an 8 Fr peel-away sheath was placed over a guidewire under fluoroscopic guidance. The catheter was then placed through the sheath and the sheath was removed. Final catheter positioning was confirmed and documented with a fluoroscopic spot radiograph. The port was accessed with a Huber needle, aspirated and flushed with heparinized saline. The port pocket incision was closed with interrupted 3-0 Vicryl suture  then Dermabond was applied, including at the venotomy incision. Dressings were placed. The patient tolerated the procedure well without immediate post procedural complication. IMPRESSION: Successful placement of a RIGHT internal jugular approach power injectable Port-A-Cath. The tip of the catheter is positioned at the superior cavo-atrial junction. The catheter is ready for immediate use. Roanna Banning, MD [facility] Electronically Signed   By: Roanna Banning M.D.   On: 08/12/2022 14:24

## 2022-09-03 ENCOUNTER — Inpatient Hospital Stay: Payer: Medicare PPO

## 2022-09-03 ENCOUNTER — Inpatient Hospital Stay (HOSPITAL_BASED_OUTPATIENT_CLINIC_OR_DEPARTMENT_OTHER): Payer: Medicare PPO | Admitting: Oncology

## 2022-09-03 ENCOUNTER — Inpatient Hospital Stay: Payer: Medicare PPO | Attending: Nurse Practitioner

## 2022-09-03 VITALS — BP 143/59 | HR 76 | Temp 96.8°F | Resp 18

## 2022-09-03 DIAGNOSIS — Z79899 Other long term (current) drug therapy: Secondary | ICD-10-CM | POA: Insufficient documentation

## 2022-09-03 DIAGNOSIS — D696 Thrombocytopenia, unspecified: Secondary | ICD-10-CM | POA: Diagnosis not present

## 2022-09-03 DIAGNOSIS — C3490 Malignant neoplasm of unspecified part of unspecified bronchus or lung: Secondary | ICD-10-CM

## 2022-09-03 DIAGNOSIS — E876 Hypokalemia: Secondary | ICD-10-CM | POA: Diagnosis not present

## 2022-09-03 DIAGNOSIS — D6481 Anemia due to antineoplastic chemotherapy: Secondary | ICD-10-CM

## 2022-09-03 DIAGNOSIS — Z5189 Encounter for other specified aftercare: Secondary | ICD-10-CM | POA: Insufficient documentation

## 2022-09-03 DIAGNOSIS — Z5111 Encounter for antineoplastic chemotherapy: Secondary | ICD-10-CM | POA: Insufficient documentation

## 2022-09-03 DIAGNOSIS — T451X5A Adverse effect of antineoplastic and immunosuppressive drugs, initial encounter: Secondary | ICD-10-CM

## 2022-09-03 DIAGNOSIS — D649 Anemia, unspecified: Secondary | ICD-10-CM | POA: Insufficient documentation

## 2022-09-03 DIAGNOSIS — C3411 Malignant neoplasm of upper lobe, right bronchus or lung: Secondary | ICD-10-CM | POA: Diagnosis not present

## 2022-09-03 DIAGNOSIS — Z95828 Presence of other vascular implants and grafts: Secondary | ICD-10-CM

## 2022-09-03 LAB — PREPARE RBC (CROSSMATCH)

## 2022-09-03 LAB — CBC WITH DIFFERENTIAL/PLATELET
Abs Immature Granulocytes: 0.24 10*3/uL — ABNORMAL HIGH (ref 0.00–0.07)
Basophils Absolute: 0 10*3/uL (ref 0.0–0.1)
Basophils Relative: 1 %
Eosinophils Absolute: 0 10*3/uL (ref 0.0–0.5)
Eosinophils Relative: 0 %
HCT: 21.8 % — ABNORMAL LOW (ref 36.0–46.0)
Hemoglobin: 7.2 g/dL — ABNORMAL LOW (ref 12.0–15.0)
Immature Granulocytes: 4 %
Lymphocytes Relative: 8 %
Lymphs Abs: 0.5 10*3/uL — ABNORMAL LOW (ref 0.7–4.0)
MCH: 31 pg (ref 26.0–34.0)
MCHC: 33 g/dL (ref 30.0–36.0)
MCV: 94 fL (ref 80.0–100.0)
Monocytes Absolute: 1.2 10*3/uL — ABNORMAL HIGH (ref 0.1–1.0)
Monocytes Relative: 18 %
Neutro Abs: 4.7 10*3/uL (ref 1.7–7.7)
Neutrophils Relative %: 69 %
Platelets: 38 10*3/uL — ABNORMAL LOW (ref 150–400)
RBC: 2.32 MIL/uL — ABNORMAL LOW (ref 3.87–5.11)
RDW: 14.6 % (ref 11.5–15.5)
WBC: 6.7 10*3/uL (ref 4.0–10.5)
nRBC: 0 % (ref 0.0–0.2)

## 2022-09-03 LAB — MAGNESIUM: Magnesium: 1.4 mg/dL — ABNORMAL LOW (ref 1.7–2.4)

## 2022-09-03 LAB — COMPREHENSIVE METABOLIC PANEL
ALT: 10 U/L (ref 0–44)
AST: 13 U/L — ABNORMAL LOW (ref 15–41)
Albumin: 3 g/dL — ABNORMAL LOW (ref 3.5–5.0)
Alkaline Phosphatase: 70 U/L (ref 38–126)
Anion gap: 9 (ref 5–15)
BUN: 11 mg/dL (ref 8–23)
CO2: 27 mmol/L (ref 22–32)
Calcium: 9.1 mg/dL (ref 8.9–10.3)
Chloride: 101 mmol/L (ref 98–111)
Creatinine, Ser: 0.74 mg/dL (ref 0.44–1.00)
GFR, Estimated: >60 mL/min (ref >60)
Glucose, Bld: 94 mg/dL (ref 70–99)
Potassium: 3.4 mmol/L — ABNORMAL LOW (ref 3.5–5.1)
Sodium: 137 mmol/L (ref 135–145)
Total Bilirubin: 0.4 mg/dL (ref 0.3–1.2)
Total Protein: 6.1 g/dL — ABNORMAL LOW (ref 6.5–8.1)

## 2022-09-03 LAB — BPAM RBC: Unit Type and Rh: 5100

## 2022-09-03 LAB — TYPE AND SCREEN

## 2022-09-03 LAB — ABO/RH: ABO/RH(D): B POS

## 2022-09-03 MED ORDER — HEPARIN SOD (PORK) LOCK FLUSH 100 UNIT/ML IV SOLN
500.0000 [IU] | Freq: Once | INTRAVENOUS | Status: AC
  Administered 2022-09-03: 500 [IU] via INTRAVENOUS

## 2022-09-03 MED ORDER — SODIUM CHLORIDE 0.9 % IV SOLN: Freq: Once | INTRAVENOUS | Status: DC

## 2022-09-03 MED ORDER — ACETAMINOPHEN 325 MG PO TABS
650.0000 mg | ORAL_TABLET | Freq: Once | ORAL | Status: AC
  Administered 2022-09-03: 650 mg via ORAL
  Filled 2022-09-03: qty 2

## 2022-09-03 MED ORDER — DIPHENHYDRAMINE HCL 25 MG PO CAPS
25.0000 mg | ORAL_CAPSULE | Freq: Once | ORAL | Status: AC
  Administered 2022-09-03: 25 mg via ORAL
  Filled 2022-09-03: qty 1

## 2022-09-03 MED ORDER — MAGNESIUM SULFATE 2 GM/50ML IV SOLN
4.0000 g | Freq: Once | INTRAVENOUS | Status: DC
  Filled 2022-09-03: qty 100

## 2022-09-03 MED ORDER — SODIUM CHLORIDE 0.9% FLUSH
10.0000 mL | INTRAVENOUS | Status: DC | PRN
  Administered 2022-09-03: 10 mL via INTRAVENOUS

## 2022-09-03 MED ORDER — MAGNESIUM SULFATE 2 GM/50ML IV SOLN
2.0000 g | Freq: Once | INTRAVENOUS | Status: AC
  Administered 2022-09-03: 2 g via INTRAVENOUS
  Filled 2022-09-03: qty 50

## 2022-09-03 MED ORDER — POTASSIUM CHLORIDE IN NACL 20-0.9 MEQ/L-% IV SOLN
Freq: Once | INTRAVENOUS | Status: AC
  Filled 2022-09-03: qty 1000

## 2022-09-03 MED ORDER — SODIUM CHLORIDE 0.9% IV SOLUTION
250.0000 mL | Freq: Once | INTRAVENOUS | Status: AC
  Administered 2022-09-03: 250 mL via INTRAVENOUS

## 2022-09-03 MED ORDER — MAGNESIUM SULFATE 2 GM/50ML IV SOLN
2.0000 g | Freq: Once | INTRAVENOUS | Status: AC
  Administered 2022-09-03: 2 g via INTRAVENOUS

## 2022-09-03 NOTE — Progress Notes (Signed)
Patients port flushed without difficulty.  Good blood return noted with no bruising or swelling noted at site.  VSS. Patient remains accessed for possible treatment.

## 2022-09-03 NOTE — Patient Instructions (Signed)
[facility]  Discharge Instructions: Thank you for choosing [facility] to provide your oncology and hematology care.  If you have a lab appointment with the Cancer Center - please note that after April 8th, 2024, all labs will be drawn in the cancer center.  You do not have to check in or register with the main entrance as you have in the past but will complete your check-in in the cancer center.  Wear comfortable clothing and clothing appropriate for easy access to any Portacath or PICC line.   We strive to give you quality time with your provider. You may need to reschedule your appointment if you arrive late (15 or more minutes).  Arriving late affects you and other patients whose appointments are after yours.  Also, if you miss three or more appointments without notifying the office, you may be dismissed from the clinic at the provider's discretion.      For prescription refill requests, have your pharmacy contact our office and allow 72 hours for refills to be completed.  To help prevent nausea and vomiting after your treatment, we encourage you to take your nausea medication as directed.  BELOW ARE SYMPTOMS THAT SHOULD BE REPORTED IMMEDIATELY: *FEVER GREATER THAN 100.4 F (38 C) OR HIGHER *CHILLS OR SWEATING *NAUSEA AND VOMITING THAT IS NOT CONTROLLED WITH YOUR NAUSEA MEDICATION *UNUSUAL SHORTNESS OF BREATH *UNUSUAL BRUISING OR BLEEDING *URINARY PROBLEMS (pain or burning when urinating, or frequent urination) *BOWEL PROBLEMS (unusual diarrhea, constipation, pain near the anus) TENDERNESS IN MOUTH AND THROAT WITH OR WITHOUT PRESENCE OF ULCERS (sore throat, sores in mouth, or a toothache) UNUSUAL RASH, SWELLING OR PAIN  UNUSUAL VAGINAL DISCHARGE OR ITCHING   Items with * indicate a potential emergency and should be followed up as soon as possible or go to the Emergency Department if any problems should occur.  Please show the CHEMOTHERAPY ALERT CARD or  IMMUNOTHERAPY ALERT CARD at check-in to the Emergency Department and triage nurse.  Should you have questions after your visit or need to cancel or reschedule your appointment, please contact [facility] [phone]  and follow the prompts.  Office hours are 8:00 a.m. to 4:30 p.m. Monday - Friday. Please note that voicemails left after 4:00 p.m. may not be returned until the following business day.  We are closed weekends and major holidays. You have access to a nurse at all times for urgent questions. Please call the main number to the clinic [phone] and follow the prompts.  For any non-urgent questions, you may also contact your provider using MyChart. We now offer e-Visits for anyone 18 and older to request care online for non-urgent symptoms. For details visit [email or web address].   Also download the MyChart app! Go to the app store, search "MyChart", open the app, select [facility], and log in with your MyChart username and password.   

## 2022-09-03 NOTE — Progress Notes (Signed)
Patient presents today for possible IVF.  Patient is in satisfactory condition with no new complaints voiced.  Vital signs are stable.  Labs reviewed by Durenda Hurt, NP during her office visit.  Hemoglobin today is 7.2.  Magnesium today is 1.4 and potassium is 3.4. We will give house fluids today with 20 mEq of potassium and 4 grams of magnesium over 2 hours today per NP.  We will also give one unit of PRBC today per NP.  We will proceed with infusions per provider orders.   IV placed in left AC. IV flushed well with good blood return noted.  We will administer blood through peripheral IV.    Patient tolerated infusions well with no complaints voiced.  Patient left ambulatory in stable condition.  Vital signs stable at discharge.  Follow up as scheduled.

## 2022-09-04 LAB — BPAM RBC
Blood Product Expiration Date: 202406232359
ISSUE DATE / TIME: 202406061118

## 2022-09-04 LAB — TYPE AND SCREEN
ABO/RH(D): B POS
Antibody Screen: NEGATIVE
Unit division: 0

## 2022-09-06 NOTE — Progress Notes (Signed)
[facility] [address], [place] [address]    Clinic Day:  09/07/2022  Referring physician: Doreatha Massed, MD  Patient Care Team: Ignatius Specking, MD as PCP - General (Internal Medicine) Pricilla Riffle, MD as PCP - Cardiology (Cardiology) Charna Elizabeth, MD as Consulting Physician (Gastroenterology) Doreatha Massed, MD as Medical Oncologist (Medical Oncology) Therese Sarah, RN as Oncology Nurse Navigator (Medical Oncology)   ASSESSMENT & PLAN:   Assessment: 1.  Recurrent extensive stage small cell lung cancer: - Patient seen at the request of Dr. Shirline Frees to get care closer to home. - 4 cycles of carboplatin and etoposide with concurrent radiation therapy for limited stage small cell lung cancer on 11/14/2009 - Status post PCI completed on 01/28/2010 - Wedge resection of the RUL by Dr. Lavinia Sharps on 09/20/2013, pathology consistent with small cell lung cancer. - 4 cycles of carboplatin and etoposide from 10/30/2013 through 01/08/2014 - PET scan on 04/23/2012: Marked hypermetabolism in the medial and anterior aspect of the left upper lobe.  Several hypermetabolic lung nodules in the left lung.  9 mm lesion in the left lower lobe.  No metastatic disease in the AP. - Bronchoscopy and FNA of the LUL nodule and LUL brushing and lavage with no malignant cells. - CT chest (08/03/2022): Scattered new nodular densities in the lungs bilaterally, largest in the left lower lobe.  New subcentimeter hypodense lesion in the left hepatic lobe 7 mm.  Periportal adenopathy 2.3 x 2.6 cm. - MRI of brain results from 08/14/2022: No metastatic disease.  Progression of small vessel ischemic disease. - PET scan from 08/13/2022 showed radiation changes in left upper lobe with focal hypermetabolism suggestive of residual recurrent tumor although this is improved from prior PET.  Dominant 8 mm subpleural nodule in the left lower lobe unchanged from recent CT and new from prior PET.  Metastatic  disease not excluded although infection/inflammation is also possible.  Prior hypermetabolic nodules on PET have resolved.  No evidence of metastatic disease in the abdomen or pelvis. - Cycle 1 of carboplatin/etoposide/durvalumab on 08/17/2022   2.  Social/family history: - She moved back to [place] recently and lives with her husband.  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  She also worked at [employer] for 15 years.  Quit smoking in 2010 and smoked 1 pack/day for 30 years. - Mother had brain tumor.  Maternal aunt had breast cancer.    Plan: 1.  Recurrent extensive stage small cell lung cancer: - She did not have any major GI side effects after cycle 1 on 08/17/2022.  However she felt weak. - She had to receive blood transfusion on 09/03/2022 for hemoglobin 7.2. - Overall she reports feeling better and is able to do her activities at home. - Reviewed labs today: Normal LFTs and creatinine.  CBC grossly normal with platelet count 79. - Will hold treatment today.  Will reevaluate blood work next week for treatment with dose reduction of VP-16 and carboplatin.  She will be evaluated in symptom management clinic 7 to 10 days after cycle 2.  RTC 3 weeks with me prior to cycle 4.   2.  Gastroparesis: - Continue Reglan 5 mg 3 times daily before meals and eat multiple small meals.  Continue fair life half can twice daily.   3.  Hypomagnesemia: - Magnesium today is 1.6.  She will receive IV magnesium. - Will start her on magnesium 400 mg daily.  4.  Anemia: - From myelosuppression.  No transfusion needed today.      No orders of the defined types were placed in this encounter.     I,Katie Daubenspeck,acting as a Neurosurgeon for Doreatha Massed, MD.,have documented all relevant documentation on the behalf of Doreatha Massed, MD,as directed by  Doreatha Massed, MD while in the presence of Doreatha Massed, MD.   I, Doreatha Massed MD, have  reviewed the above documentation for accuracy and completeness, and I agree with the above.   Doreatha Massed, MD   6/10/20245:53 PM  CHIEF COMPLAINT:   Diagnosis: recurrent small cell lung cancer    Cancer Staging  Small cell lung cancer (HCC) Staging form: Lung, AJCC 7th Edition - Clinical: Limited Stage Small cell Lung Cancer - Signed by Si Gaul, MD on 05/10/2013    Prior Therapy: 1. Concurrent chemoradiation with carboplatin and etoposide, completed 11/14/2009.  2. Prophylactic cranial irradiation, completed 01/28/2010. 3. RUL wedge resection on 09/21/2013 (Dr. Laneta Simmers) 4. 4 cycles of carboplatin and etoposide, 10/30/2013 - 01/10/2014  Current Therapy:  Carboplatin, etoposide and durvalumab    HISTORY OF PRESENT ILLNESS:   Oncology History  Small cell lung cancer (HCC)  04/10/2011 Initial Diagnosis   Small cell lung cancer (HCC)   08/17/2022 -  Chemotherapy   Patient is on Treatment Plan : LUNG SMALL CELL EXTENSIVE STAGE Durvalumab + Carboplatin D1 + Etoposide D1-3 q21d x 4 Cycles / Durvalumab q28d        INTERVAL HISTORY:   Claudia [name] is a 77 y.o. female presenting to clinic today for follow up of recurrent small cell lung cancer. She was last seen by NP Victorino Dike on 09/03/22.  Today, she states that she is doing well overall. Her appetite level is at 50%. Her energy level is at 25%.  PAST MEDICAL HISTORY:   Past Medical History: Past Medical History:  Diagnosis Date   Achalasia    Anemia    Arthritis    Bradycardia    mild,may be due to beta blocker therapy   COPD (chronic obstructive pulmonary disease) (HCC)    Gastroparesis    GERD (gastroesophageal reflux disease)    otc   Hypertension 06/01/2019   pt denies   Local recurrence of lung cancer (HCC) dx'd 07/2013   rt thoracotomy chemo comp 12/2013   Lung cancer (HCC) 03/30/2008   Dr. Arbutus Ped, finished chemo, sp radiation, left upper    Lymphocytic colitis    Pneumonia     Surgical History: Past  Surgical History:  Procedure Laterality Date   BRONCHIAL BIOPSY  05/28/2022   Procedure: BRONCHIAL BIOPSIES;  Surgeon: Omar Person, MD;  Location: [facility] ENDOSCOPY;  Service: Pulmonary;;   BRONCHIAL BRUSHINGS  05/28/2022   Procedure: BRONCHIAL BRUSHINGS;  Surgeon: Omar Person, MD;  Location: [facility] ENDOSCOPY;  Service: Pulmonary;;   BRONCHIAL NEEDLE ASPIRATION BIOPSY  05/28/2022   Procedure: BRONCHIAL NEEDLE ASPIRATION BIOPSIES;  Surgeon: Omar Person, MD;  Location: [facility] ENDOSCOPY;  Service: Pulmonary;;   BRONCHIAL WASHINGS  05/28/2022   Procedure: BRONCHIAL WASHINGS;  Surgeon: Omar Person, MD;  Location: [facility] ENDOSCOPY;  Service: Pulmonary;;   BRONCHOSCOPY  2011   DILATION AND CURETTAGE OF UTERUS     ESOPHAGOGASTRODUODENOSCOPY (EGD) WITH PROPOFOL N/A 07/29/2021   Procedure: ESOPHAGOGASTRODUODENOSCOPY (EGD) WITH PROPOFOL;  Surgeon: Dolores Frame, MD;  Location: [facility] ENDO SUITE;  Service: Gastroenterology;  Laterality: N/A;  1235 ASA 2   HEMOSTASIS CONTROL  05/28/2022   Procedure: HEMOSTASIS CONTROL;  Surgeon: Thora Lance,  Ivor Costa, MD;  Location: [facility] ENDOSCOPY;  Service: Pulmonary;;   IR IMAGING GUIDED PORT INSERTION  08/12/2022   NECK SURGERY  1980's   THORACOTOMY Right 09/21/2013   Procedure: THORACOTOMY MAJOR;  Surgeon: Alleen Borne, MD;  Location: [facility] OR;  Service: Thoracic;  Laterality: Right;   UTERINE FIBROID SURGERY  2012   WEDGE RESECTION Right 09/21/2013   Procedure: RIGHT UPPER LOBE WEDGE RESECTION;  Surgeon: Alleen Borne, MD;  Location: [facility] OR;  Service: Thoracic;  Laterality: Right;    Social History: Social History   Socioeconomic History   Marital status: Married    Spouse name: Not on file   Number of children: Not on file   Years of education: Not on file   Highest education level: Not on file  Occupational History   Occupation: clerical    Employer: [employer]  Tobacco Use   Smoking status: Former    Packs/day: 1.00    Years: 35.00     Additional pack years: 0.00    Total pack years: 35.00    Types: Cigarettes    Quit date: 03/31/2007    Years since quitting: 15.4    Passive exposure: Past   Smokeless tobacco: Never  Vaping Use   Vaping Use: Never used  Substance and Sexual Activity   Alcohol use: Yes    Comment: occassional about 3 drinks per year   Drug use: No   Sexual activity: Never  Other Topics Concern   Not on file  Social History Narrative   Not on file   Social Determinants of Health   Financial Resource Strain: Not on file  Food Insecurity: No Food Insecurity (08/06/2022)   Hunger Vital Sign    Worried About Running Out of Food in the Last Year: Never true    Ran Out of Food in the Last Year: Never true  Transportation Needs: No Transportation Needs (08/06/2022)   PRAPARE - Administrator, Civil Service (Medical): No    Lack of Transportation (Non-Medical): No  Physical Activity: Not on file  Stress: Not on file  Social Connections: Not on file  Intimate Partner Violence: Not At Risk (08/06/2022)   Humiliation, Afraid, Rape, and Kick questionnaire    Fear of Current or Ex-Partner: No    Emotionally Abused: No    Physically Abused: No    Sexually Abused: No    Family History: Family History  Problem Relation Age of Onset   Brain cancer Mother        brain cancer   Emphysema Father    Diabetes Son    Heart disease Paternal Grandfather    Breast cancer Maternal Aunt     Current Medications:  Current Outpatient Medications:    Calcium Carbonate Antacid (TUMS PO), Take 500-1,000 mg by mouth 3 (three) times daily as needed (indigestion/heartburn.)., Disp: , Rfl:    Cholecalciferol (VITAMIN D-3) 25 MCG (1000 UT) CAPS, Take 1,000 Units by mouth in the morning., Disp: , Rfl:    Coenzyme Q10 100 MG TABS, Take 100 mg by mouth in the morning., Disp: , Rfl:    ferrous sulfate 325 (65 FE) MG tablet, Take 1 tablet (325 mg total) by mouth daily., Disp: 30 tablet, Rfl: 3    lidocaine-prilocaine (EMLA) cream, Apply 1 Application topically as needed., Disp: 30 g, Rfl: 1   lidocaine-prilocaine (EMLA) cream, Apply to affected area once, Disp: 30 g, Rfl: 3   magnesium oxide (MAG-OX) 400 (240 Mg) MG tablet,  Take 1 tablet (400 mg total) by mouth daily., Disp: 60 tablet, Rfl: 2   metoCLOPramide (REGLAN) 5 MG tablet, Take 1 tablet (5 mg total) by mouth 3 (three) times daily before meals., Disp: 180 tablet, Rfl: 3   Multiple Vitamin (MULTIVITAMIN WITH MINERALS) TABS tablet, Take 1 tablet by mouth in the morning., Disp: , Rfl:    Probiotic Product (PROBIOTIC DAILY PO), Take 1 capsule by mouth in the morning., Disp: , Rfl:    prochlorperazine (COMPAZINE) 10 MG tablet, Take 1 tablet (10 mg total) by mouth every 6 (six) hours as needed for nausea or vomiting., Disp: 30 tablet, Rfl: 3   prochlorperazine (COMPAZINE) 10 MG tablet, Take 1 tablet (10 mg total) by mouth every 6 (six) hours as needed for nausea or vomiting., Disp: 60 tablet, Rfl: 4   rosuvastatin (CRESTOR) 20 MG tablet, Take 1 tablet (20 mg total) by mouth daily., Disp: 90 tablet, Rfl: 3   tretinoin (RETIN-A) 0.1 % cream, Apply 1 application  topically at bedtime., Disp: , Rfl:    Turmeric Curcumin 500 MG CAPS, Take 500 mg by mouth in the morning., Disp: , Rfl:    Allergies: Allergies  Allergen Reactions   Azithromycin Shortness Of Breath and Rash    Took Tussionex at same time Jan 2013.   Tussionex Pennkinetic Er [Hydrocod Poli-Chlorphe Poli Er] Shortness Of Breath and Rash    Took Azithromycin at same time Jan 2013   Omeprazole Rash    REVIEW OF SYSTEMS:   Review of Systems  Constitutional:  Negative for chills, fatigue and fever.  HENT:   Negative for lump/mass, mouth sores, nosebleeds, sore throat and trouble swallowing.   Eyes:  Negative for eye problems.  Respiratory:  Positive for cough. Negative for shortness of breath.   Cardiovascular:  Negative for chest pain, leg swelling and palpitations.   Gastrointestinal:  Positive for constipation. Negative for abdominal pain, diarrhea, nausea and vomiting.  Genitourinary:  Negative for bladder incontinence, difficulty urinating, dysuria, frequency, hematuria and nocturia.   Musculoskeletal:  Negative for arthralgias, back pain, flank pain, myalgias and neck pain.  Skin:  Negative for itching and rash.  Neurological:  Negative for dizziness, headaches and numbness.  Hematological:  Does not bruise/bleed easily.  Psychiatric/Behavioral:  Positive for sleep disturbance. Negative for depression and suicidal ideas. The patient is not nervous/anxious.   All other systems reviewed and are negative.    VITALS:   There were no vitals taken for this visit.  Wt Readings from Last 3 Encounters:  09/07/22 94 lb 9.6 oz (42.9 kg)  08/17/22 96 lb 9.6 oz (43.8 kg)  08/12/22 98 lb (44.5 kg)    There is no height or weight on file to calculate BMI.  Performance status (ECOG): 1 - Symptomatic but completely ambulatory  PHYSICAL EXAM:   Physical Exam Vitals and nursing note reviewed. Exam conducted with a chaperone present.  Constitutional:      Appearance: Normal appearance.  Cardiovascular:     Rate and Rhythm: Normal rate and regular rhythm.     Pulses: Normal pulses.     Heart sounds: Normal heart sounds.  Pulmonary:     Effort: Pulmonary effort is normal.     Breath sounds: Normal breath sounds.  Abdominal:     Palpations: Abdomen is soft. There is no hepatomegaly, splenomegaly or mass.     Tenderness: There is no abdominal tenderness.  Musculoskeletal:     Right lower leg: No edema.     Left  lower leg: No edema.  Lymphadenopathy:     Cervical: No cervical adenopathy.     Right cervical: No superficial, deep or posterior cervical adenopathy.    Left cervical: No superficial, deep or posterior cervical adenopathy.     Upper Body:     Right upper body: No supraclavicular or axillary adenopathy.     Left upper body: No supraclavicular  or axillary adenopathy.  Neurological:     General: No focal deficit present.     Mental Status: She is alert and oriented to person, place, and time.  Psychiatric:        Mood and Affect: Mood normal.        Behavior: Behavior normal.     LABS:      Latest Ref Rng & Units 09/07/2022    9:03 AM 09/03/2022    9:01 AM 08/17/2022    8:37 AM  CBC  WBC 4.0 - 10.5 K/uL 13.3  6.7  4.8   Hemoglobin 12.0 - 15.0 g/dL 9.3  7.2  13.0   Hematocrit 36.0 - 46.0 % 27.8  21.8  31.6   Platelets 150 - 400 K/uL 79  38  117       Latest Ref Rng & Units 09/07/2022    9:03 AM 09/03/2022    9:01 AM 08/17/2022    8:37 AM  CMP  Glucose 70 - 99 mg/dL 865  94  91   BUN 8 - 23 mg/dL 16  11  19    Creatinine 0.44 - 1.00 mg/dL 7.84  6.96  2.95   Sodium 135 - 145 mmol/L 132  137  137   Potassium 3.5 - 5.1 mmol/L 3.7  3.4  3.4   Chloride 98 - 111 mmol/L 99  101  104   CO2 22 - 32 mmol/L 26  27  28    Calcium 8.9 - 10.3 mg/dL 9.1  9.1  9.5   Total Protein 6.5 - 8.1 g/dL 6.2  6.1  6.4   Total Bilirubin 0.3 - 1.2 mg/dL 0.8  0.4  0.9   Alkaline Phos 38 - 126 U/L 74  70  61   AST 15 - 41 U/L 24  13  15    ALT 0 - 44 U/L 11  10  8       No results found for: "CEA1", "CEA" / No results found for: "CEA1", "CEA" No results found for: "PSA1" No results found for: "MWU132" No results found for: "CAN125"  No results found for: "TOTALPROTELP", "ALBUMINELP", "A1GS", "A2GS", "BETS", "BETA2SER", "GAMS", "MSPIKE", "SPEI" Lab Results  Component Value Date   TIBC 238 (L) 08/17/2022   TIBC 149 (L) 10/08/2021   TIBC 159 (L) 04/10/2011   FERRITIN 296 08/17/2022   FERRITIN 675 (H) 10/08/2021   FERRITIN 1,680 (H) 04/10/2011   IRONPCTSAT 21 08/17/2022   IRONPCTSAT 9 (L) 10/08/2021   IRONPCTSAT 18 (L) 04/10/2011   No results found for: "LDH"   STUDIES:   NM PET Image Restag (PS) Skull Base To Thigh  Result Date: 08/18/2022 CLINICAL DATA:  Subsequent treatment strategy for recurrent small cell lung cancer. EXAM:  NUCLEAR MEDICINE PET SKULL BASE TO THIGH TECHNIQUE: 4.9 mCi F-18 FDG was injected intravenously. Full-ring PET imaging was performed from the skull base to thigh after the radiotracer. CT data was obtained and used for attenuation correction and anatomic localization. Fasting blood glucose: 73 mg/dl COMPARISON:  CT chest dated 08/03/2022.  PET-CT dated 04/23/2022. FINDINGS: Mediastinal blood pool activity: SUV max 2.2  Liver activity: SUV max NA NECK: No hypermetabolic cervical lymphadenopathy. Incidental CT findings: None. CHEST: Radiation changes with volume loss in the left upper lobe. Associated focal hypermetabolism in the anterior/inferior aspect of the collapsed right upper lobe, max SUV 7.1, previously 18.5. Dominant 8 mm subpleural nodule in the left lower lobe (series 7/image 38), max SUV 6.4, unchanged from recent CT and new from prior PET. Metastatic disease not excluded, although infection/inflammation is also possible. When compared to prior PET, the prior hypermetabolic medial left lower lobe and lateral left upper lobe nodules have resolved. Mild residual peribronchovascular nodularity at the left lung base measuring up to 4 mm (series 7/image 39), non FDG avid but beneath the size threshold for PET sensitivity, favored to be infectious/inflammatory. Status post right upper lobe wedge resection. Right chest port terminates at the cavoatrial junction. Incidental CT findings: Leftward cardiomediastinal shift. Mild atherosclerotic calcifications of the aortic arch. Fluid/debris within the mid/distal esophagus. ABDOMEN/PELVIS: No abnormal hypermetabolism in the liver, spleen, pancreas, or adrenal glands. No hypermetabolic abdominopelvic lymphadenopathy. Incidental CT findings: Mild layering gallbladder sludge versus noncalcified gallstones. Atherosclerotic calcifications of the abdominal aorta and branch vessels. SKELETON: No focal hypermetabolic activity to suggest skeletal metastasis. Incidental CT  findings: Degenerative changes of the visualized thoracolumbar spine. IMPRESSION: Radiation changes in the left upper lobe with focal hypermetabolism suggestive of residual/recurrent tumor, although improved from prior PET. Dominant 8 mm subpleural nodule in the left lower lobe, unchanged from recent CT and new from prior PET. Metastatic disease not excluded, although infection/inflammation is also possible. Additional prior hypermetabolic nodules on PET have resolved. No evidence of metastatic disease in the abdomen/pelvis. Electronically Signed   By: Charline Bills M.D.   On: 08/18/2022 00:04   MR Brain W Wo Contrast  Result Date: 08/17/2022 CLINICAL DATA:  77 year old female with lung cancer.  Restaging. EXAM: MRI HEAD WITHOUT AND WITH CONTRAST TECHNIQUE: Multiplanar, multiecho pulse sequences of the brain and surrounding structures were obtained without and with intravenous contrast. CONTRAST:  4.42mL GADAVIST GADOBUTROL 1 MMOL/ML IV SOLN COMPARISON:  Brain MRI 10/13/2013. FINDINGS: Brain: No midline shift, mass effect, or evidence of intracranial mass lesion. No abnormal enhancement identified. No dural thickening identified. No restricted diffusion or evidence of acute infarction. However, chronic lacunar infarcts of the right basal ganglia and central pons are new from the 2015 MRI. And now numerous small chronic microhemorrhages are scattered throughout both cerebral hemispheres (versus fewer than 6 evident on T2 * in 2015. See series 10, images 26, 34 today. Brainstem and fairly numerous cerebellar chronic microhemorrhages also. But background gray and white matter signal is otherwise largely stable since 2015. Mild for age other nonspecific cerebral white matter T2 and FLAIR signal changes. No ventriculomegaly, extra-axial collection or acute intracranial hemorrhage. Cervicomedullary junction and pituitary are within normal limits. Vascular: Major intracranial vascular flow voids are stable since  2015. Major dural venous sinuses are enhancing following contrast and appear to be patent. Skull and upper cervical spine: Partially visible cervical spine degeneration at C5-C6. Probably mild degenerative spinal stenosis there. Normal background bone marrow signal. Sinuses/Orbits: Postoperative changes to both globes since 2015. Paranasal Visualized paranasal sinuses and mastoids are stable and well aerated. Other: Visible internal auditory structures appear normal. Visible scalp and face appear negative aside from evidence of previous left parotidectomy. IMPRESSION: 1. No metastatic disease or acute intracranial abnormality. 2. But progression of small vessel chronic ischemic disease since 2015, and there are now numerous chronic microhemorrhages scattered throughout the brain - suggesting  Amyloid Angiopathy. Electronically Signed   By: Odessa Fleming M.D.   On: 08/17/2022 08:18   IR IMAGING GUIDED PORT INSERTION  Result Date: 08/12/2022 INDICATION: SCLC, chemotherapy administration EXAM: IMPLANTED PORT A CATH PLACEMENT WITH ULTRASOUND AND FLUOROSCOPIC GUIDANCE MEDICATIONS: None ANESTHESIA/SEDATION: Moderate (conscious) sedation was employed during this procedure. A total of Versed 1 mg and Fentanyl 50 mcg was administered intravenously. Moderate Sedation Time: 22 minutes. The patient's level of consciousness and vital signs were monitored continuously by radiology nursing throughout the procedure under my direct supervision. FLUOROSCOPY TIME:  Fluoroscopic dose; 0 mGy COMPLICATIONS: None immediate. PROCEDURE: The procedure, risks, benefits, and alternatives were explained to the patient. Questions regarding the procedure were encouraged and answered. The patient understands and consents to the procedure. The RIGHT neck and chest were prepped with chlorhexidine in a sterile fashion, and a sterile drape was applied covering the operative field. Maximum barrier sterile technique with sterile gowns and gloves were used  for the procedure. A timeout was performed prior to the initiation of the procedure. Local anesthesia was provided with 1% lidocaine with epinephrine. After creating a small venotomy incision, a micropuncture kit was utilized to access the internal jugular vein under direct, real-time ultrasound guidance. Ultrasound image documentation was performed. The microwire was kinked to measure appropriate catheter length. A subcutaneous port pocket was then created along the upper chest wall utilizing a combination of sharp and blunt dissection. The pocket was irrigated with sterile saline. A single lumen ISP power injectable port was chosen for placement. The 8 Fr catheter was tunneled from the port pocket site to the venotomy incision. The port was placed in the pocket. The external catheter was trimmed to appropriate length. At the venotomy, an 8 Fr peel-away sheath was placed over a guidewire under fluoroscopic guidance. The catheter was then placed through the sheath and the sheath was removed. Final catheter positioning was confirmed and documented with a fluoroscopic spot radiograph. The port was accessed with a Huber needle, aspirated and flushed with heparinized saline. The port pocket incision was closed with interrupted 3-0 Vicryl suture then Dermabond was applied, including at the venotomy incision. Dressings were placed. The patient tolerated the procedure well without immediate post procedural complication. IMPRESSION: Successful placement of a RIGHT internal jugular approach power injectable Port-A-Cath. The tip of the catheter is positioned at the superior cavo-atrial junction. The catheter is ready for immediate use. Roanna Banning, MD [facility] Electronically Signed   By: Roanna Banning M.D.   On: 08/12/2022 14:24

## 2022-09-07 ENCOUNTER — Inpatient Hospital Stay (HOSPITAL_BASED_OUTPATIENT_CLINIC_OR_DEPARTMENT_OTHER): Payer: Medicare PPO | Admitting: Hematology

## 2022-09-07 ENCOUNTER — Inpatient Hospital Stay: Payer: Medicare PPO

## 2022-09-07 ENCOUNTER — Other Ambulatory Visit: Payer: Self-pay

## 2022-09-07 VITALS — BP 107/70 | HR 88 | Temp 97.8°F | Resp 16

## 2022-09-07 VITALS — BP 86/49 | HR 102 | Temp 98.9°F | Resp 16 | Wt 94.6 lb

## 2022-09-07 DIAGNOSIS — C3490 Malignant neoplasm of unspecified part of unspecified bronchus or lung: Secondary | ICD-10-CM

## 2022-09-07 DIAGNOSIS — E876 Hypokalemia: Secondary | ICD-10-CM | POA: Diagnosis not present

## 2022-09-07 DIAGNOSIS — Z5111 Encounter for antineoplastic chemotherapy: Secondary | ICD-10-CM | POA: Diagnosis not present

## 2022-09-07 DIAGNOSIS — C3411 Malignant neoplasm of upper lobe, right bronchus or lung: Secondary | ICD-10-CM | POA: Diagnosis not present

## 2022-09-07 DIAGNOSIS — Z95828 Presence of other vascular implants and grafts: Secondary | ICD-10-CM

## 2022-09-07 DIAGNOSIS — Z5189 Encounter for other specified aftercare: Secondary | ICD-10-CM | POA: Diagnosis not present

## 2022-09-07 DIAGNOSIS — D649 Anemia, unspecified: Secondary | ICD-10-CM | POA: Diagnosis not present

## 2022-09-07 DIAGNOSIS — T451X5A Adverse effect of antineoplastic and immunosuppressive drugs, initial encounter: Secondary | ICD-10-CM

## 2022-09-07 DIAGNOSIS — Z79899 Other long term (current) drug therapy: Secondary | ICD-10-CM | POA: Diagnosis not present

## 2022-09-07 LAB — CBC WITH DIFFERENTIAL/PLATELET
Abs Immature Granulocytes: 0.25 10*3/uL — ABNORMAL HIGH (ref 0.00–0.07)
Basophils Absolute: 0.1 10*3/uL (ref 0.0–0.1)
Basophils Relative: 1 %
Eosinophils Absolute: 0 10*3/uL (ref 0.0–0.5)
Eosinophils Relative: 0 %
HCT: 27.8 % — ABNORMAL LOW (ref 36.0–46.0)
Hemoglobin: 9.3 g/dL — ABNORMAL LOW (ref 12.0–15.0)
Immature Granulocytes: 2 %
Lymphocytes Relative: 3 %
Lymphs Abs: 0.4 10*3/uL — ABNORMAL LOW (ref 0.7–4.0)
MCH: 31.3 pg (ref 26.0–34.0)
MCHC: 33.5 g/dL (ref 30.0–36.0)
MCV: 93.6 fL (ref 80.0–100.0)
Monocytes Absolute: 2 10*3/uL — ABNORMAL HIGH (ref 0.1–1.0)
Monocytes Relative: 15 %
Neutro Abs: 10.7 10*3/uL — ABNORMAL HIGH (ref 1.7–7.7)
Neutrophils Relative %: 79 %
Platelets: 79 10*3/uL — ABNORMAL LOW (ref 150–400)
RBC: 2.97 MIL/uL — ABNORMAL LOW (ref 3.87–5.11)
RDW: 14.3 % (ref 11.5–15.5)
WBC: 13.3 10*3/uL — ABNORMAL HIGH (ref 4.0–10.5)
nRBC: 0 % (ref 0.0–0.2)

## 2022-09-07 LAB — COMPREHENSIVE METABOLIC PANEL
ALT: 11 U/L (ref 0–44)
AST: 24 U/L (ref 15–41)
Albumin: 2.8 g/dL — ABNORMAL LOW (ref 3.5–5.0)
Alkaline Phosphatase: 74 U/L (ref 38–126)
Anion gap: 7 (ref 5–15)
BUN: 16 mg/dL (ref 8–23)
CO2: 26 mmol/L (ref 22–32)
Calcium: 9.1 mg/dL (ref 8.9–10.3)
Chloride: 99 mmol/L (ref 98–111)
Creatinine, Ser: 0.87 mg/dL (ref 0.44–1.00)
GFR, Estimated: >60 mL/min (ref >60)
Glucose, Bld: 114 mg/dL — ABNORMAL HIGH (ref 70–99)
Potassium: 3.7 mmol/L (ref 3.5–5.1)
Sodium: 132 mmol/L — ABNORMAL LOW (ref 135–145)
Total Bilirubin: 0.8 mg/dL (ref 0.3–1.2)
Total Protein: 6.2 g/dL — ABNORMAL LOW (ref 6.5–8.1)

## 2022-09-07 LAB — SAMPLE TO BLOOD BANK

## 2022-09-07 LAB — MAGNESIUM: Magnesium: 1.6 mg/dL — ABNORMAL LOW (ref 1.7–2.4)

## 2022-09-07 MED ORDER — SODIUM CHLORIDE 0.9 % IV SOLN: INTRAVENOUS | Status: DC

## 2022-09-07 MED ORDER — SODIUM CHLORIDE 0.9% FLUSH
10.0000 mL | Freq: Once | INTRAVENOUS | Status: AC
  Administered 2022-09-07: 10 mL via INTRAVENOUS

## 2022-09-07 MED ORDER — MAGNESIUM SULFATE 2 GM/50ML IV SOLN
2.0000 g | Freq: Once | INTRAVENOUS | Status: AC
  Administered 2022-09-07: 2 g via INTRAVENOUS
  Filled 2022-09-07: qty 50

## 2022-09-07 MED ORDER — POTASSIUM CHLORIDE IN NACL 20-0.9 MEQ/L-% IV SOLN
Freq: Once | INTRAVENOUS | Status: AC
  Filled 2022-09-07: qty 1000

## 2022-09-07 MED ORDER — HEPARIN SOD (PORK) LOCK FLUSH 100 UNIT/ML IV SOLN
500.0000 [IU] | Freq: Once | INTRAVENOUS | Status: AC
  Administered 2022-09-07: 500 [IU] via INTRAVENOUS

## 2022-09-07 MED ORDER — MAGNESIUM OXIDE -MG SUPPLEMENT 400 (240 MG) MG PO TABS: 400.0000 mg | ORAL_TABLET | Freq: Every day | ORAL | 2 refills | Status: DC

## 2022-09-07 NOTE — Patient Instructions (Signed)
[facility]  Discharge Instructions: Thank you for choosing [facility] to provide your oncology and hematology care.  If you have a lab appointment with the Cancer Center - please note that after April 8th, 2024, all labs will be drawn in the cancer center.  You do not have to check in or register with the main entrance as you have in the past but will complete your check-in in the cancer center.  Wear comfortable clothing and clothing appropriate for easy access to any Portacath or PICC line.   We strive to give you quality time with your provider. You may need to reschedule your appointment if you arrive late (15 or more minutes).  Arriving late affects you and other patients whose appointments are after yours.  Also, if you miss three or more appointments without notifying the office, you may be dismissed from the clinic at the provider's discretion.      For prescription refill requests, have your pharmacy contact our office and allow 72 hours for refills to be completed.  To help prevent nausea and vomiting after your treatment, we encourage you to take your nausea medication as directed.  BELOW ARE SYMPTOMS THAT SHOULD BE REPORTED IMMEDIATELY: *FEVER GREATER THAN 100.4 F (38 C) OR HIGHER *CHILLS OR SWEATING *NAUSEA AND VOMITING THAT IS NOT CONTROLLED WITH YOUR NAUSEA MEDICATION *UNUSUAL SHORTNESS OF BREATH *UNUSUAL BRUISING OR BLEEDING *URINARY PROBLEMS (pain or burning when urinating, or frequent urination) *BOWEL PROBLEMS (unusual diarrhea, constipation, pain near the anus) TENDERNESS IN MOUTH AND THROAT WITH OR WITHOUT PRESENCE OF ULCERS (sore throat, sores in mouth, or a toothache) UNUSUAL RASH, SWELLING OR PAIN  UNUSUAL VAGINAL DISCHARGE OR ITCHING   Items with * indicate a potential emergency and should be followed up as soon as possible or go to the Emergency Department if any problems should occur.  Please show the CHEMOTHERAPY ALERT CARD or  IMMUNOTHERAPY ALERT CARD at check-in to the Emergency Department and triage nurse.  Should you have questions after your visit or need to cancel or reschedule your appointment, please contact [facility] [phone]  and follow the prompts.  Office hours are 8:00 a.m. to 4:30 p.m. Monday - Friday. Please note that voicemails left after 4:00 p.m. may not be returned until the following business day.  We are closed weekends and major holidays. You have access to a nurse at all times for urgent questions. Please call the main number to the clinic [phone] and follow the prompts.  For any non-urgent questions, you may also contact your provider using MyChart. We now offer e-Visits for anyone 18 and older to request care online for non-urgent symptoms. For details visit [email or web address].   Also download the MyChart app! Go to the app store, search "MyChart", open the app, select [facility], and log in with your MyChart username and password.   

## 2022-09-07 NOTE — Progress Notes (Signed)
Patient tolerated hydration with no complaints voiced.  Port site clean and dry with good blood return noted before and after hydration.  No bruising or swelling noted with port.  Band aid applied.  VSS with discharge and left ambulatory with no s/s of distress noted.   

## 2022-09-07 NOTE — Progress Notes (Signed)
Patients port flushed without difficulty.  Good blood return noted with no bruising or swelling noted at site. Patient's blood pressure 86/49 with recheck and hr 102-105. Dr Ellin Saba aware. Lab added. MD to see patient. Patient remains accessed for possible treatment.

## 2022-09-07 NOTE — Progress Notes (Deleted)
Patient has been examined by Dr. Ellin Saba. Vital signs and labs have been reviewed by MD - ANC, Creatinine, LFTs, hemoglobin, and platelets are within treatment parameters per M.D. - pt may proceed with treatment. Magnesium replacement per standing orders. Primary RN and pharmacy notified.

## 2022-09-08 ENCOUNTER — Inpatient Hospital Stay: Payer: Medicare PPO

## 2022-09-09 ENCOUNTER — Inpatient Hospital Stay: Payer: Medicare PPO

## 2022-09-11 ENCOUNTER — Inpatient Hospital Stay: Payer: Medicare PPO

## 2022-09-14 ENCOUNTER — Other Ambulatory Visit: Payer: Self-pay | Admitting: Hematology

## 2022-09-14 ENCOUNTER — Inpatient Hospital Stay: Payer: Medicare PPO

## 2022-09-14 VITALS — BP 96/48 | HR 86 | Temp 97.3°F | Resp 18 | Wt 96.8 lb

## 2022-09-14 DIAGNOSIS — Z95828 Presence of other vascular implants and grafts: Secondary | ICD-10-CM

## 2022-09-14 DIAGNOSIS — C3490 Malignant neoplasm of unspecified part of unspecified bronchus or lung: Secondary | ICD-10-CM

## 2022-09-14 DIAGNOSIS — C3411 Malignant neoplasm of upper lobe, right bronchus or lung: Secondary | ICD-10-CM | POA: Diagnosis not present

## 2022-09-14 DIAGNOSIS — E876 Hypokalemia: Secondary | ICD-10-CM | POA: Diagnosis not present

## 2022-09-14 DIAGNOSIS — Z5189 Encounter for other specified aftercare: Secondary | ICD-10-CM | POA: Diagnosis not present

## 2022-09-14 DIAGNOSIS — D6481 Anemia due to antineoplastic chemotherapy: Secondary | ICD-10-CM

## 2022-09-14 DIAGNOSIS — D649 Anemia, unspecified: Secondary | ICD-10-CM | POA: Diagnosis not present

## 2022-09-14 DIAGNOSIS — Z5111 Encounter for antineoplastic chemotherapy: Secondary | ICD-10-CM | POA: Diagnosis not present

## 2022-09-14 DIAGNOSIS — Z79899 Other long term (current) drug therapy: Secondary | ICD-10-CM | POA: Diagnosis not present

## 2022-09-14 LAB — COMPREHENSIVE METABOLIC PANEL
ALT: 12 U/L (ref 0–44)
AST: 14 U/L — ABNORMAL LOW (ref 15–41)
Albumin: 2.8 g/dL — ABNORMAL LOW (ref 3.5–5.0)
Alkaline Phosphatase: 64 U/L (ref 38–126)
Anion gap: 6 (ref 5–15)
BUN: 18 mg/dL (ref 8–23)
CO2: 29 mmol/L (ref 22–32)
Calcium: 9.5 mg/dL (ref 8.9–10.3)
Chloride: 101 mmol/L (ref 98–111)
Creatinine, Ser: 0.86 mg/dL (ref 0.44–1.00)
GFR, Estimated: >60 mL/min (ref >60)
Glucose, Bld: 102 mg/dL — ABNORMAL HIGH (ref 70–99)
Potassium: 4.1 mmol/L (ref 3.5–5.1)
Sodium: 136 mmol/L (ref 135–145)
Total Bilirubin: 0.5 mg/dL (ref 0.3–1.2)
Total Protein: 6.2 g/dL — ABNORMAL LOW (ref 6.5–8.1)

## 2022-09-14 LAB — CBC WITH DIFFERENTIAL/PLATELET
Abs Immature Granulocytes: 0.08 10*3/uL — ABNORMAL HIGH (ref 0.00–0.07)
Basophils Absolute: 0.1 10*3/uL (ref 0.0–0.1)
Basophils Relative: 1 %
Eosinophils Absolute: 0 10*3/uL (ref 0.0–0.5)
Eosinophils Relative: 0 %
HCT: 27.1 % — ABNORMAL LOW (ref 36.0–46.0)
Hemoglobin: 8.8 g/dL — ABNORMAL LOW (ref 12.0–15.0)
Immature Granulocytes: 1 %
Lymphocytes Relative: 6 %
Lymphs Abs: 0.4 10*3/uL — ABNORMAL LOW (ref 0.7–4.0)
MCH: 31 pg (ref 26.0–34.0)
MCHC: 32.5 g/dL (ref 30.0–36.0)
MCV: 95.4 fL (ref 80.0–100.0)
Monocytes Absolute: 1.3 10*3/uL — ABNORMAL HIGH (ref 0.1–1.0)
Monocytes Relative: 17 %
Neutro Abs: 5.9 10*3/uL (ref 1.7–7.7)
Neutrophils Relative %: 75 %
Platelets: 233 10*3/uL (ref 150–400)
RBC: 2.84 MIL/uL — ABNORMAL LOW (ref 3.87–5.11)
RDW: 14 % (ref 11.5–15.5)
WBC: 7.8 10*3/uL (ref 4.0–10.5)
nRBC: 0 % (ref 0.0–0.2)

## 2022-09-14 LAB — SAMPLE TO BLOOD BANK

## 2022-09-14 LAB — MAGNESIUM: Magnesium: 1.6 mg/dL — ABNORMAL LOW (ref 1.7–2.4)

## 2022-09-14 MED ORDER — CETIRIZINE HCL 10 MG/ML IV SOLN
10.0000 mg | Freq: Once | INTRAVENOUS | Status: AC
  Administered 2022-09-14: 10 mg via INTRAVENOUS
  Filled 2022-09-14: qty 1

## 2022-09-14 MED ORDER — HEPARIN SOD (PORK) LOCK FLUSH 100 UNIT/ML IV SOLN
500.0000 [IU] | Freq: Once | INTRAVENOUS | Status: AC | PRN
  Administered 2022-09-14: 500 [IU]

## 2022-09-14 MED ORDER — SODIUM CHLORIDE 0.9% FLUSH
10.0000 mL | INTRAVENOUS | Status: DC | PRN
  Administered 2022-09-14: 10 mL via INTRAVENOUS

## 2022-09-14 MED ORDER — SODIUM CHLORIDE 0.9 % IV SOLN: Freq: Once | INTRAVENOUS | Status: AC

## 2022-09-14 MED ORDER — SODIUM CHLORIDE 0.9 % IV SOLN
80.0000 mg/m2 | Freq: Once | INTRAVENOUS | Status: AC
  Administered 2022-09-14: 112 mg via INTRAVENOUS
  Filled 2022-09-14: qty 5.6

## 2022-09-14 MED ORDER — FAMOTIDINE IN NACL 20-0.9 MG/50ML-% IV SOLN
20.0000 mg | Freq: Once | INTRAVENOUS | Status: AC
  Administered 2022-09-14: 20 mg via INTRAVENOUS
  Filled 2022-09-14: qty 50

## 2022-09-14 MED ORDER — MAGNESIUM SULFATE 2 GM/50ML IV SOLN
2.0000 g | Freq: Once | INTRAVENOUS | Status: AC
  Administered 2022-09-14: 2 g via INTRAVENOUS
  Filled 2022-09-14: qty 50

## 2022-09-14 MED ORDER — SODIUM CHLORIDE 0.9% FLUSH
10.0000 mL | INTRAVENOUS | Status: DC | PRN
  Administered 2022-09-14: 10 mL

## 2022-09-14 MED ORDER — SODIUM CHLORIDE 0.9 % IV SOLN
1500.0000 mg | Freq: Once | INTRAVENOUS | Status: AC
  Administered 2022-09-14: 1500 mg via INTRAVENOUS
  Filled 2022-09-14: qty 30

## 2022-09-14 MED ORDER — SODIUM CHLORIDE 0.9 % IV SOLN
10.0000 mg | Freq: Once | INTRAVENOUS | Status: AC
  Administered 2022-09-14: 10 mg via INTRAVENOUS
  Filled 2022-09-14: qty 10

## 2022-09-14 MED ORDER — SODIUM CHLORIDE 0.9 % IV SOLN
200.0000 mg | Freq: Once | INTRAVENOUS | Status: AC
  Administered 2022-09-14: 200 mg via INTRAVENOUS
  Filled 2022-09-14: qty 20

## 2022-09-14 MED ORDER — PALONOSETRON HCL INJECTION 0.25 MG/5ML
0.2500 mg | Freq: Once | INTRAVENOUS | Status: AC
  Administered 2022-09-14: 0.25 mg via INTRAVENOUS
  Filled 2022-09-14: qty 5

## 2022-09-14 MED ORDER — SODIUM CHLORIDE 0.9 % IV SOLN
150.0000 mg | Freq: Once | INTRAVENOUS | Status: AC
  Administered 2022-09-14: 150 mg via INTRAVENOUS
  Filled 2022-09-14: qty 150

## 2022-09-14 NOTE — Patient Instructions (Signed)
[facility]  Discharge Instructions: Thank you for choosing [facility] to provide your oncology and hematology care.  If you have a lab appointment with the Cancer Center - please note that after April 8th, 2024, all labs will be drawn in the cancer center.  You do not have to check in or register with the main entrance as you have in the past but will complete your check-in in the cancer center.  Wear comfortable clothing and clothing appropriate for easy access to any Portacath or PICC line.   We strive to give you quality time with your provider. You may need to reschedule your appointment if you arrive late (15 or more minutes).  Arriving late affects you and other patients whose appointments are after yours.  Also, if you miss three or more appointments without notifying the office, you may be dismissed from the clinic at the provider's discretion.      For prescription refill requests, have your pharmacy contact our office and allow 72 hours for refills to be completed.    Today you received the following chemotherapy and/or immunotherapy agents Imfinzi/Carboplatin/Etoposide.  Durvalumab Injection What is this medication? DURVALUMAB (dur VAL ue mab) treats some types of cancer. It works by helping your immune system slow or stop the spread of cancer cells. It is a monoclonal antibody. This medicine may be used for other purposes; ask your health care provider or pharmacist if you have questions. COMMON BRAND NAME(S): IMFINZI What should I tell my care team before I take this medication? They need to know if you have any of these conditions: Allogeneic stem cell transplant (uses someone else's stem cells) Autoimmune diseases, such as Crohn disease, ulcerative colitis, lupus History of chest radiation Nervous system problems, such as Guillain-Barre syndrome, myasthenia gravis Organ transplant An unusual or allergic reaction to durvalumab, other  medications, foods, dyes, or preservatives Pregnant or trying to get pregnant Breast-feeding How should I use this medication? This medication is infused into a vein. It is given by your care team in a hospital or clinic setting. A special MedGuide will be given to you before each treatment. Be sure to read this information carefully each time. Talk to your care team about the use of this medication in children. Special care may be needed. Overdosage: If you think you have taken too much of this medicine contact a poison control center or emergency room at once. NOTE: This medicine is only for you. Do not share this medicine with others. What if I miss a dose? Keep appointments for follow-up doses. It is important not to miss your dose. Call your care team if you are unable to keep an appointment. What may interact with this medication? Interactions have not been studied. This list may not describe all possible interactions. Give your health care provider a list of all the medicines, herbs, non-prescription drugs, or dietary supplements you use. Also tell them if you smoke, drink alcohol, or use illegal drugs. Some items may interact with your medicine. What should I watch for while using this medication? Your condition will be monitored carefully while you are receiving this medication. You may need blood work while taking this medication. This medication may cause serious skin reactions. They can happen weeks to months after starting the medication. Contact your care team right away if you notice fevers or flu-like symptoms with a rash. The rash may be red or purple and then turn into blisters or peeling of   the skin. You may also notice a red rash with swelling of the face, lips, or lymph nodes in your neck or under your arms. Tell your care team right away if you have any change in your eyesight. Talk to your care team if you may be pregnant. Serious birth defects can occur if you take this  medication during pregnancy and for 3 months after the last dose. You will need a negative pregnancy test before starting this medication. Contraception is recommended while taking this medication and for 3 months after the last dose. Your care team can help you find the option that works for you. Do not breastfeed while taking this medication and for 3 months after the last dose. What side effects may I notice from receiving this medication? Side effects that you should report to your care team as soon as possible: Allergic reactions--skin rash, itching, hives, swelling of the face, lips, tongue, or throat Dry cough, shortness of breath or trouble breathing Eye pain, redness, irritation, or discharge with blurry or decreased vision Heart muscle inflammation--unusual weakness or fatigue, shortness of breath, chest pain, fast or irregular heartbeat, dizziness, swelling of the ankles, feet, or hands Hormone gland problems--headache, sensitivity to light, unusual weakness or fatigue, dizziness, fast or irregular heartbeat, increased sensitivity to cold or heat, excessive sweating, constipation, hair loss, increased thirst or amount of urine, tremors or shaking, irritability Infusion reactions--chest pain, shortness of breath or trouble breathing, feeling faint or lightheaded Kidney injury (glomerulonephritis)--decrease in the amount of urine, red or dark Eduard Penkala urine, foamy or bubbly urine, swelling of the ankles, hands, or feet Liver injury--right upper belly pain, loss of appetite, nausea, light-colored stool, dark yellow or Christabell Loseke urine, yellowing skin or eyes, unusual weakness or fatigue Pain, tingling, or numbness in the hands or feet, muscle weakness, change in vision, confusion or trouble speaking, loss of balance or coordination, trouble walking, seizures Rash, fever, and swollen lymph nodes Redness, blistering, peeling, or loosening of the skin, including inside the mouth Sudden or severe stomach  pain, bloody diarrhea, fever, nausea, vomiting Side effects that usually do not require medical attention (report these to your care team if they continue or are bothersome): Bone, joint, or muscle pain Diarrhea Fatigue Loss of appetite Nausea Skin rash This list may not describe all possible side effects. Call your doctor for medical advice about side effects. You may report side effects to FDA at 1-800-FDA-1088. Where should I keep my medication? This medication is given in a hospital or clinic. It will not be stored at home. NOTE: This sheet is a summary. It may not cover all possible information. If you have questions about this medicine, talk to your doctor, pharmacist, or health care provider.  2024 Elsevier/Gold Standard (2021-07-29 00:00:00)    Carboplatin Injection What is this medication? CARBOPLATIN (KAR boe pla tin) treats some types of cancer. It works by slowing down the growth of cancer cells. This medicine may be used for other purposes; ask your health care provider or pharmacist if you have questions. COMMON BRAND NAME(S): Paraplatin What should I tell my care team before I take this medication? They need to know if you have any of these conditions: Blood disorders Hearing problems Kidney disease Recent or ongoing radiation therapy An unusual or allergic reaction to carboplatin, cisplatin, other medications, foods, dyes, or preservatives Pregnant or trying to get pregnant Breast-feeding How should I use this medication? This medication is injected into a vein. It is given by your care team   in a hospital or clinic setting. Talk to your care team about the use of this medication in children. Special care may be needed. Overdosage: If you think you have taken too much of this medicine contact a poison control center or emergency room at once. NOTE: This medicine is only for you. Do not share this medicine with others. What if I miss a dose? Keep appointments for  follow-up doses. It is important not to miss your dose. Call your care team if you are unable to keep an appointment. What may interact with this medication? Medications for seizures Some antibiotics, such as amikacin, gentamicin, neomycin, streptomycin, tobramycin Vaccines This list may not describe all possible interactions. Give your health care provider a list of all the medicines, herbs, non-prescription drugs, or dietary supplements you use. Also tell them if you smoke, drink alcohol, or use illegal drugs. Some items may interact with your medicine. What should I watch for while using this medication? Your condition will be monitored carefully while you are receiving this medication. You may need blood work while taking this medication. This medication may make you feel generally unwell. This is not uncommon, as chemotherapy can affect healthy cells as well as cancer cells. Report any side effects. Continue your course of treatment even though you feel ill unless your care team tells you to stop. In some cases, you may be given additional medications to help with side effects. Follow all directions for their use. This medication may increase your risk of getting an infection. Call your care team for advice if you get a fever, chills, sore throat, or other symptoms of a cold or flu. Do not treat yourself. Try to avoid being around people who are sick. Avoid taking medications that contain aspirin, acetaminophen, ibuprofen, naproxen, or ketoprofen unless instructed by your care team. These medications may hide a fever. Be careful brushing or flossing your teeth or using a toothpick because you may get an infection or bleed more easily. If you have any dental work done, tell your dentist you are receiving this medication. Talk to your care team if you wish to become pregnant or think you might be pregnant. This medication can cause serious birth defects. Talk to your care team about effective forms  of contraception. Do not breast-feed while taking this medication. What side effects may I notice from receiving this medication? Side effects that you should report to your care team as soon as possible: Allergic reactions--skin rash, itching, hives, swelling of the face, lips, tongue, or throat Infection--fever, chills, cough, sore throat, wounds that don't heal, pain or trouble when passing urine, general feeling of discomfort or being unwell Low red blood cell level--unusual weakness or fatigue, dizziness, headache, trouble breathing Pain, tingling, or numbness in the hands or feet, muscle weakness, change in vision, confusion or trouble speaking, loss of balance or coordination, trouble walking, seizures Unusual bruising or bleeding Side effects that usually do not require medical attention (report to your care team if they continue or are bothersome): Hair loss Nausea Unusual weakness or fatigue Vomiting This list may not describe all possible side effects. Call your doctor for medical advice about side effects. You may report side effects to FDA at 1-800-FDA-1088. Where should I keep my medication? This medication is given in a hospital or clinic. It will not be stored at home. NOTE: This sheet is a summary. It may not cover all possible information. If you have questions about this medicine, talk to your doctor,   pharmacist, or health care provider.  2024 Elsevier/Gold Standard (2021-07-08 00:00:00)    Etoposide Injection What is this medication? ETOPOSIDE (e toe POE side) treats some types of cancer. It works by slowing down the growth of cancer cells. This medicine may be used for other purposes; ask your health care provider or pharmacist if you have questions. COMMON BRAND NAME(S): Etopophos, Toposar, VePesid What should I tell my care team before I take this medication? They need to know if you have any of these conditions: Infection Kidney disease Liver disease Low blood  counts, such as low white cell, platelet, red cell counts An unusual or allergic reaction to etoposide, other medications, foods, dyes, or preservatives If you or your partner are pregnant or trying to get pregnant Breastfeeding How should I use this medication? This medication is injected into a vein. It is given by your care team in a hospital or clinic setting. Talk to your care team about the use of this medication in children. Special care may be needed. Overdosage: If you think you have taken too much of this medicine contact a poison control center or emergency room at once. NOTE: This medicine is only for you. Do not share this medicine with others. What if I miss a dose? Keep appointments for follow-up doses. It is important not to miss your dose. Call your care team if you are unable to keep an appointment. What may interact with this medication? Warfarin This list may not describe all possible interactions. Give your health care provider a list of all the medicines, herbs, non-prescription drugs, or dietary supplements you use. Also tell them if you smoke, drink alcohol, or use illegal drugs. Some items may interact with your medicine. What should I watch for while using this medication? Your condition will be monitored carefully while you are receiving this medication. This medication may make you feel generally unwell. This is not uncommon as chemotherapy can affect healthy cells as well as cancer cells. Report any side effects. Continue your course of treatment even though you feel ill unless your care team tells you to stop. This medication can cause serious side effects. To reduce the risk, your care team may give you other medications to take before receiving this one. Be sure to follow the directions from your care team. This medication may increase your risk of getting an infection. Call your care team for advice if you get a fever, chills, sore throat, or other symptoms of a  cold or flu. Do not treat yourself. Try to avoid being around people who are sick. This medication may increase your risk to bruise or bleed. Call your care team if you notice any unusual bleeding. Talk to your care team about your risk of cancer. You may be more at risk for certain types of cancers if you take this medication. Talk to your care team if you may be pregnant. Serious birth defects can occur if you take this medication during pregnancy and for 6 months after the last dose. You will need a negative pregnancy test before starting this medication. Contraception is recommended while taking this medication and for 6 months after the last dose. Your care team can help you find the option that works for you. If your partner can get pregnant, use a condom during sex while taking this medication and for 4 months after the last dose. Do not breastfeed while taking this medication. This medication may cause infertility. Talk to your care team if you   are concerned about your fertility. What side effects may I notice from receiving this medication? Side effects that you should report to your care team as soon as possible: Allergic reactions--skin rash, itching, hives, swelling of the face, lips, tongue, or throat Infection--fever, chills, cough, sore throat, wounds that don't heal, pain or trouble when passing urine, general feeling of discomfort or being unwell Low red blood cell level--unusual weakness or fatigue, dizziness, headache, trouble breathing Unusual bruising or bleeding Side effects that usually do not require medical attention (report to your care team if they continue or are bothersome): Diarrhea Fatigue Hair loss Loss of appetite Nausea Vomiting This list may not describe all possible side effects. Call your doctor for medical advice about side effects. You may report side effects to FDA at 1-800-FDA-1088. Where should I keep my medication? This medication is given in a  hospital or clinic. It will not be stored at home. NOTE: This sheet is a summary. It may not cover all possible information. If you have questions about this medicine, talk to your doctor, pharmacist, or health care provider.  2024 Elsevier/Gold Standard (2021-08-07 00:00:00)        To help prevent nausea and vomiting after your treatment, we encourage you to take your nausea medication as directed.  BELOW ARE SYMPTOMS THAT SHOULD BE REPORTED IMMEDIATELY: *FEVER GREATER THAN 100.4 F (38 C) OR HIGHER *CHILLS OR SWEATING *NAUSEA AND VOMITING THAT IS NOT CONTROLLED WITH YOUR NAUSEA MEDICATION *UNUSUAL SHORTNESS OF BREATH *UNUSUAL BRUISING OR BLEEDING *URINARY PROBLEMS (pain or burning when urinating, or frequent urination) *BOWEL PROBLEMS (unusual diarrhea, constipation, pain near the anus) TENDERNESS IN MOUTH AND THROAT WITH OR WITHOUT PRESENCE OF ULCERS (sore throat, sores in mouth, or a toothache) UNUSUAL RASH, SWELLING OR PAIN  UNUSUAL VAGINAL DISCHARGE OR ITCHING   Items with * indicate a potential emergency and should be followed up as soon as possible or go to the Emergency Department if any problems should occur.  Please show the CHEMOTHERAPY ALERT CARD or IMMUNOTHERAPY ALERT CARD at check-in to the Emergency Department and triage nurse.  Should you have questions after your visit or need to cancel or reschedule your appointment, please contact [facility] AT  [phone]  and follow the prompts.  Office hours are 8:00 a.m. to 4:30 p.m. Monday - Friday. Please note that voicemails left after 4:00 p.m. may not be returned until the following business day.  We are closed weekends and major holidays. You have access to a nurse at all times for urgent questions. Please call the main number to the clinic [phone] and follow the prompts.  For any non-urgent questions, you may also contact your provider using MyChart. We now offer e-Visits for anyone 18 and older to  request care online for non-urgent symptoms. For details visit [email or web address].   Also download the MyChart app! Go to the app store, search "MyChart", open the app, select [facility], and log in with your MyChart username and password.   

## 2022-09-14 NOTE — Progress Notes (Signed)
Patient presents today for chemotherapy infusion.  Patient is in satisfactory condition with no new complaints voiced.  Vital signs are stable.  Labs reviewed and all labs are within treatment parameters.  Magnesium today is 1.6.  We will give magnesium sulfate 2 grams IV x one dose today per standing orders by Dr. Ellin Saba.  We will proceed with treatment per MD orders.    Patient tolerated treatment well with no complaints voiced.  Patient left ambulatory in stable condition.  Vital signs stable at discharge.  Follow up as scheduled.

## 2022-09-14 NOTE — Progress Notes (Signed)
  Doses reduced for all cycles:  Carboplatin flat dose of 200 mg IVPB  Etoposide 80 mg/m2 IVPB   Today and for all future cycles.  V.O. Dr Carilyn Goodpasture, PharmD

## 2022-09-14 NOTE — Progress Notes (Signed)
Patients port flushed without difficulty.  Good blood return noted with no bruising or swelling noted at site.  VSS. Patient remains accessed for treatment.  

## 2022-09-15 ENCOUNTER — Inpatient Hospital Stay: Payer: Medicare PPO

## 2022-09-15 VITALS — BP 112/61 | HR 98 | Temp 97.0°F | Resp 20

## 2022-09-15 DIAGNOSIS — Z79899 Other long term (current) drug therapy: Secondary | ICD-10-CM | POA: Diagnosis not present

## 2022-09-15 DIAGNOSIS — Z5189 Encounter for other specified aftercare: Secondary | ICD-10-CM | POA: Diagnosis not present

## 2022-09-15 DIAGNOSIS — C3411 Malignant neoplasm of upper lobe, right bronchus or lung: Secondary | ICD-10-CM | POA: Diagnosis not present

## 2022-09-15 DIAGNOSIS — C3490 Malignant neoplasm of unspecified part of unspecified bronchus or lung: Secondary | ICD-10-CM

## 2022-09-15 DIAGNOSIS — D649 Anemia, unspecified: Secondary | ICD-10-CM | POA: Diagnosis not present

## 2022-09-15 DIAGNOSIS — E876 Hypokalemia: Secondary | ICD-10-CM | POA: Diagnosis not present

## 2022-09-15 DIAGNOSIS — Z5111 Encounter for antineoplastic chemotherapy: Secondary | ICD-10-CM | POA: Diagnosis not present

## 2022-09-15 MED ORDER — SODIUM CHLORIDE 0.9 % IV SOLN: Freq: Once | INTRAVENOUS | Status: AC

## 2022-09-15 MED ORDER — HEPARIN SOD (PORK) LOCK FLUSH 100 UNIT/ML IV SOLN
500.0000 [IU] | Freq: Once | INTRAVENOUS | Status: AC | PRN
  Administered 2022-09-15: 500 [IU]

## 2022-09-15 MED ORDER — SODIUM CHLORIDE 0.9 % IV SOLN
10.0000 mg | Freq: Once | INTRAVENOUS | Status: AC
  Administered 2022-09-15: 10 mg via INTRAVENOUS
  Filled 2022-09-15: qty 10

## 2022-09-15 MED ORDER — SODIUM CHLORIDE 0.9% FLUSH
10.0000 mL | INTRAVENOUS | Status: DC | PRN
  Administered 2022-09-15: 10 mL

## 2022-09-15 MED ORDER — SODIUM CHLORIDE 0.9 % IV SOLN
80.0000 mg/m2 | Freq: Once | INTRAVENOUS | Status: AC
  Administered 2022-09-15: 112 mg via INTRAVENOUS
  Filled 2022-09-15: qty 5.6

## 2022-09-15 NOTE — Progress Notes (Signed)
Patient presents today for Etoposide infusion per providers order.  Vital sign within parameters for treatment.  Patient has no new complaints at this time.  Treatment given today per MD orders.  Stable during infusion without adverse affects.  Vital signs stable.  No complaints at this time.  Discharge from clinic ambulatory in stable condition.  Alert and oriented X 3.  Follow up with [facility] as scheduled.

## 2022-09-15 NOTE — Patient Instructions (Signed)
[facility] [place]  Discharge Instructions: Thank you for choosing [facility] to provide your oncology and hematology care.  If you have a lab appointment with the Cancer Center - please note that after April 8th, 2024, all labs will be drawn in the cancer center.  You do not have to check in or register with the main entrance as you have in the past but will complete your check-in in the cancer center.  Wear comfortable clothing and clothing appropriate for easy access to any Portacath or PICC line.   We strive to give you quality time with your provider. You may need to reschedule your appointment if you arrive late (15 or more minutes).  Arriving late affects you and other patients whose appointments are after yours.  Also, if you miss three or more appointments without notifying the office, you may be dismissed from the clinic at the provider's discretion.      For prescription refill requests, have your pharmacy contact our office and allow 72 hours for refills to be completed.    Today you received the following chemotherapy and/or immunotherapy agents Etoposide      To help prevent nausea and vomiting after your treatment, we encourage you to take your nausea medication as directed.  BELOW ARE SYMPTOMS THAT SHOULD BE REPORTED IMMEDIATELY: *FEVER GREATER THAN 100.4 F (38 C) OR HIGHER *CHILLS OR SWEATING *NAUSEA AND VOMITING THAT IS NOT CONTROLLED WITH YOUR NAUSEA MEDICATION *UNUSUAL SHORTNESS OF BREATH *UNUSUAL BRUISING OR BLEEDING *URINARY PROBLEMS (pain or burning when urinating, or frequent urination) *BOWEL PROBLEMS (unusual diarrhea, constipation, pain near the anus) TENDERNESS IN MOUTH AND THROAT WITH OR WITHOUT PRESENCE OF ULCERS (sore throat, sores in mouth, or a toothache) UNUSUAL RASH, SWELLING OR PAIN  UNUSUAL VAGINAL DISCHARGE OR ITCHING   Items with * indicate a potential emergency and should be followed up as soon as possible or go to the  Emergency Department if any problems should occur.  Please show the CHEMOTHERAPY ALERT CARD or IMMUNOTHERAPY ALERT CARD at check-in to the Emergency Department and triage nurse.  Should you have questions after your visit or need to cancel or reschedule your appointment, please contact [facility] [place] [phone]  and follow the prompts.  Office hours are 8:00 a.m. to 4:30 p.m. Monday - Friday. Please note that voicemails left after 4:00 p.m. may not be returned until the following business day.  We are closed weekends and major holidays. You have access to a nurse at all times for urgent questions. Please call the main number to the clinic [phone] and follow the prompts.  For any non-urgent questions, you may also contact your provider using MyChart. We now offer e-Visits for anyone 18 and older to request care online for non-urgent symptoms. For details visit [email or web address].   Also download the MyChart app! Go to the app store, search "MyChart", open the app, select [place], and log in with your MyChart username and password.   

## 2022-09-16 ENCOUNTER — Inpatient Hospital Stay: Payer: Medicare PPO

## 2022-09-16 VITALS — BP 117/63 | HR 95 | Temp 97.2°F | Resp 20

## 2022-09-16 DIAGNOSIS — C3490 Malignant neoplasm of unspecified part of unspecified bronchus or lung: Secondary | ICD-10-CM

## 2022-09-16 DIAGNOSIS — C3411 Malignant neoplasm of upper lobe, right bronchus or lung: Secondary | ICD-10-CM | POA: Diagnosis not present

## 2022-09-16 DIAGNOSIS — Z5111 Encounter for antineoplastic chemotherapy: Secondary | ICD-10-CM | POA: Diagnosis not present

## 2022-09-16 DIAGNOSIS — Z79899 Other long term (current) drug therapy: Secondary | ICD-10-CM | POA: Diagnosis not present

## 2022-09-16 DIAGNOSIS — Z5189 Encounter for other specified aftercare: Secondary | ICD-10-CM | POA: Diagnosis not present

## 2022-09-16 DIAGNOSIS — D649 Anemia, unspecified: Secondary | ICD-10-CM | POA: Diagnosis not present

## 2022-09-16 DIAGNOSIS — E876 Hypokalemia: Secondary | ICD-10-CM | POA: Diagnosis not present

## 2022-09-16 MED ORDER — SODIUM CHLORIDE 0.9 % IV SOLN
10.0000 mg | Freq: Once | INTRAVENOUS | Status: AC
  Administered 2022-09-16: 10 mg via INTRAVENOUS
  Filled 2022-09-16: qty 10

## 2022-09-16 MED ORDER — SODIUM CHLORIDE 0.9 % IV SOLN: Freq: Once | INTRAVENOUS | Status: AC

## 2022-09-16 MED ORDER — SODIUM CHLORIDE 0.9 % IV SOLN
80.0000 mg/m2 | Freq: Once | INTRAVENOUS | Status: AC
  Administered 2022-09-16: 112 mg via INTRAVENOUS
  Filled 2022-09-16: qty 5.6

## 2022-09-16 MED ORDER — SODIUM CHLORIDE 0.9% FLUSH
10.0000 mL | INTRAVENOUS | Status: DC | PRN
  Administered 2022-09-16: 10 mL

## 2022-09-16 MED ORDER — HEPARIN SOD (PORK) LOCK FLUSH 100 UNIT/ML IV SOLN
500.0000 [IU] | Freq: Once | INTRAVENOUS | Status: AC | PRN
  Administered 2022-09-16: 500 [IU]

## 2022-09-16 NOTE — Patient Instructions (Signed)
[facility] [place]  Discharge Instructions: Thank you for choosing [facility] to provide your oncology and hematology care.  If you have a lab appointment with the Cancer Center - please note that after April 8th, 2024, all labs will be drawn in the cancer center.  You do not have to check in or register with the main entrance as you have in the past but will complete your check-in in the cancer center.  Wear comfortable clothing and clothing appropriate for easy access to any Portacath or PICC line.   We strive to give you quality time with your provider. You may need to reschedule your appointment if you arrive late (15 or more minutes).  Arriving late affects you and other patients whose appointments are after yours.  Also, if you miss three or more appointments without notifying the office, you may be dismissed from the clinic at the provider's discretion.      For prescription refill requests, have your pharmacy contact our office and allow 72 hours for refills to be completed.    Today you received the following chemotherapy and/or immunotherapy agents etoposide   To help prevent nausea and vomiting after your treatment, we encourage you to take your nausea medication as directed.  BELOW ARE SYMPTOMS THAT SHOULD BE REPORTED IMMEDIATELY: *FEVER GREATER THAN 100.4 F (38 C) OR HIGHER *CHILLS OR SWEATING *NAUSEA AND VOMITING THAT IS NOT CONTROLLED WITH YOUR NAUSEA MEDICATION *UNUSUAL SHORTNESS OF BREATH *UNUSUAL BRUISING OR BLEEDING *URINARY PROBLEMS (pain or burning when urinating, or frequent urination) *BOWEL PROBLEMS (unusual diarrhea, constipation, pain near the anus) TENDERNESS IN MOUTH AND THROAT WITH OR WITHOUT PRESENCE OF ULCERS (sore throat, sores in mouth, or a toothache) UNUSUAL RASH, SWELLING OR PAIN  UNUSUAL VAGINAL DISCHARGE OR ITCHING   Items with * indicate a potential emergency and should be followed up as soon as possible or go to the  Emergency Department if any problems should occur.  Please show the CHEMOTHERAPY ALERT CARD or IMMUNOTHERAPY ALERT CARD at check-in to the Emergency Department and triage nurse.  Should you have questions after your visit or need to cancel or reschedule your appointment, please contact [facility] [place] [phone]  and follow the prompts.  Office hours are 8:00 a.m. to 4:30 p.m. Monday - Friday. Please note that voicemails left after 4:00 p.m. may not be returned until the following business day.  We are closed weekends and major holidays. You have access to a nurse at all times for urgent questions. Please call the main number to the clinic [phone] and follow the prompts.  For any non-urgent questions, you may also contact your provider using MyChart. We now offer e-Visits for anyone 18 and older to request care online for non-urgent symptoms. For details visit [email or web address].   Also download the MyChart app! Go to the app store, search "MyChart", open the app, select [place], and log in with your MyChart username and password.   

## 2022-09-16 NOTE — Progress Notes (Signed)
Patient tolerated chemotherapy with no complaints voiced.  Side effects with management reviewed with understanding verbalized.  Port site clean and dry with no bruising or swelling noted at site.  Good blood return noted before and after administration of chemotherapy.  Band aid applied.  Patient left in satisfactory condition with VSS and no s/s of distress noted.   

## 2022-09-18 ENCOUNTER — Inpatient Hospital Stay: Payer: Medicare PPO

## 2022-09-18 VITALS — BP 113/54 | HR 92 | Temp 97.9°F | Resp 18

## 2022-09-18 DIAGNOSIS — C3490 Malignant neoplasm of unspecified part of unspecified bronchus or lung: Secondary | ICD-10-CM

## 2022-09-18 DIAGNOSIS — Z79899 Other long term (current) drug therapy: Secondary | ICD-10-CM | POA: Diagnosis not present

## 2022-09-18 DIAGNOSIS — Z5189 Encounter for other specified aftercare: Secondary | ICD-10-CM | POA: Diagnosis not present

## 2022-09-18 DIAGNOSIS — C3411 Malignant neoplasm of upper lobe, right bronchus or lung: Secondary | ICD-10-CM | POA: Diagnosis not present

## 2022-09-18 DIAGNOSIS — Z5111 Encounter for antineoplastic chemotherapy: Secondary | ICD-10-CM | POA: Diagnosis not present

## 2022-09-18 DIAGNOSIS — E876 Hypokalemia: Secondary | ICD-10-CM | POA: Diagnosis not present

## 2022-09-18 DIAGNOSIS — D649 Anemia, unspecified: Secondary | ICD-10-CM | POA: Diagnosis not present

## 2022-09-18 MED ORDER — PEGFILGRASTIM-CBQV 6 MG/0.6ML ~~LOC~~ SOSY
6.0000 mg | PREFILLED_SYRINGE | Freq: Once | SUBCUTANEOUS | Status: AC
  Administered 2022-09-18: 6 mg via SUBCUTANEOUS
  Filled 2022-09-18: qty 0.6

## 2022-09-18 NOTE — Patient Instructions (Signed)
[facility] [place]  Discharge Instructions: Thank you for choosing [facility] to provide your oncology and hematology care.  If you have a lab appointment with the Cancer Center - please note that after April 8th, 2024, all labs will be drawn in the cancer center.  You do not have to check in or register with the main entrance as you have in the past but will complete your check-in in the cancer center.  Wear comfortable clothing and clothing appropriate for easy access to any Portacath or PICC line.   We strive to give you quality time with your provider. You may need to reschedule your appointment if you arrive late (15 or more minutes).  Arriving late affects you and other patients whose appointments are after yours.  Also, if you miss three or more appointments without notifying the office, you may be dismissed from the clinic at the provider's discretion.      For prescription refill requests, have your pharmacy contact our office and allow 72 hours for refills to be completed.    Today you received  Udenyca injection.   BELOW ARE SYMPTOMS THAT SHOULD BE REPORTED IMMEDIATELY: *FEVER GREATER THAN 100.4 F (38 C) OR HIGHER *CHILLS OR SWEATING *NAUSEA AND VOMITING THAT IS NOT CONTROLLED WITH YOUR NAUSEA MEDICATION *UNUSUAL SHORTNESS OF BREATH *UNUSUAL BRUISING OR BLEEDING *URINARY PROBLEMS (pain or burning when urinating, or frequent urination) *BOWEL PROBLEMS (unusual diarrhea, constipation, pain near the anus) TENDERNESS IN MOUTH AND THROAT WITH OR WITHOUT PRESENCE OF ULCERS (sore throat, sores in mouth, or a toothache) UNUSUAL RASH, SWELLING OR PAIN  UNUSUAL VAGINAL DISCHARGE OR ITCHING   Items with * indicate a potential emergency and should be followed up as soon as possible or go to the Emergency Department if any problems should occur.  Please show the CHEMOTHERAPY ALERT CARD or IMMUNOTHERAPY ALERT CARD at check-in to the Emergency Department and  triage nurse.  Should you have questions after your visit or need to cancel or reschedule your appointment, please contact [facility] [place] [phone]  and follow the prompts.  Office hours are 8:00 a.m. to 4:30 p.m. Monday - Friday. Please note that voicemails left after 4:00 p.m. may not be returned until the following business day.  We are closed weekends and major holidays. You have access to a nurse at all times for urgent questions. Please call the main number to the clinic [phone] and follow the prompts.  For any non-urgent questions, you may also contact your provider using MyChart. We now offer e-Visits for anyone 18 and older to request care online for non-urgent symptoms. For details visit [email or web address].   Also download the MyChart app! Go to the app store, search "MyChart", open the app, select [place], and log in with your MyChart username and password.   

## 2022-09-18 NOTE — Progress Notes (Signed)
Claudia [name] presents today for injection per the provider's orders.  Udenyca administration without incident; injection site WNL; see MAR for injection details.  Patient tolerated procedure well and without incident.  No questions or complaints noted at this time.   Discharged from clinic ambulatory in stable condition. Alert and oriented x 3. F/U with [facility] as scheduled.

## 2022-09-21 ENCOUNTER — Other Ambulatory Visit: Payer: Self-pay

## 2022-09-21 DIAGNOSIS — C3490 Malignant neoplasm of unspecified part of unspecified bronchus or lung: Secondary | ICD-10-CM

## 2022-09-21 NOTE — Progress Notes (Unsigned)
[facility] [address] [place] [address]    Clinic Day:  09/21/2022  Referring physician: Ignatius Specking, MD  Patient Care Team: Ignatius Specking, MD as PCP - General (Internal Medicine) Pricilla Riffle, MD as PCP - Cardiology (Cardiology) Charna Elizabeth, MD as Consulting Physician (Gastroenterology) Doreatha Massed, MD as Medical Oncologist (Medical Oncology) Therese Sarah, RN as Oncology Nurse Navigator (Medical Oncology)   ASSESSMENT & PLAN:   Assessment: 1.  Recurrent extensive stage small cell lung cancer: - Patient seen at the request of Dr. Shirline Frees to get care closer to home. - 4 cycles of carboplatin and etoposide with concurrent radiation therapy for limited stage small cell lung cancer on 11/14/2009 - Status post PCI completed on 01/28/2010 - Wedge resection of the RUL by Dr. Lavinia Sharps on 09/20/2013, pathology consistent with small cell lung cancer. - 4 cycles of carboplatin and etoposide from 10/30/2013 through 01/08/2014 - PET scan on 04/23/2012: Marked hypermetabolism in the medial and anterior aspect of the left upper lobe.  Several hypermetabolic lung nodules in the left lung.  9 mm lesion in the left lower lobe.  No metastatic disease in the AP. - Bronchoscopy and FNA of the LUL nodule and LUL brushing and lavage with no malignant cells. - CT chest (08/03/2022): Scattered new nodular densities in the lungs bilaterally, largest in the left lower lobe.  New subcentimeter hypodense lesion in the left hepatic lobe 7 mm.  Periportal adenopathy 2.3 x 2.6 cm. - MRI of brain results from 08/14/2022: No metastatic disease.  Progression of small vessel ischemic disease. - PET scan from 08/13/2022 showed radiation changes in left upper lobe with focal hypermetabolism suggestive of residual recurrent tumor although this is improved from prior PET.  Dominant 8 mm subpleural nodule in the left lower lobe unchanged from recent CT and new from prior PET.  Metastatic  disease not excluded although infection/inflammation is also possible.  Prior hypermetabolic nodules on PET have resolved.  No evidence of metastatic disease in the abdomen or pelvis. - Cycle 1 of carboplatin/etoposide/durvalumab on 08/17/2022   2.  Social/family history: - She moved back to [place] recently and lives with her husband.  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  She also worked at [employer] for 15 years.  Quit smoking in 2010 and smoked 1 pack/day for 30 years. - Mother had brain tumor.  Maternal aunt had breast cancer.    Plan: 1.  Recurrent extensive stage small cell lung cancer: - She is status post 2 cycles, carbo/etoposide/durvalumab last given on 09/14/2022. -Required 1 unit PRBC between cycle 1 and cycle 2 for hemoglobin of 7.2. -Labs from 09/14/2022 showed hemoglobin of 8.8, platelets 233 and magnesium 1.6. -She is here for follow-up from Cycle 2.  -Labs from 09/22/2022 showed sodium level of 134, magnesium level 1.7.  Hemoglobin is 7.5, white blood cell count 0.7, platelet count 55,000 and ANC 200. -Recommend 1 unit packed red blood cells, 1 L normal saline with 20 mill equivalents of potassium and 1 g of magnesium. -Return to clinic as scheduled on 10/05/2022 for consideration of cycle 3.  2.  Gastroparesis: - Continue Reglan 5 mg 3 times daily before meals and eat multiple small meals.  Continue fair life half can twice daily.   3.  Hypomagnesemia: - Magnesium today is 1.7.   - Continue oral magnesium 400 mg daily.   4.  Anemia: - From myelosuppression.  -Hemoglobin  7.5 today.  Proceed with 1 unit PRBC per above.    PLAN SUMMARY: >> 1 unit PRBC, 1 L normal saline, 1 g magnesium and 20 mill equivalents potassium while in clinic today >> Return to clinic in 2 weeks on 10/05/2022 for lab work and consideration of cycle 3 of chemotherapy.      No orders of the defined types were placed in this encounter.  I spent 30 minutes dedicated  to the care of this patient (face-to-face and non-face-to-face) on the date of the encounter to include what is described in the assessment and plan.   Mauro Kaufmann, NP   6/24/20248:08 PM  CHIEF COMPLAINT:   Diagnosis: recurrent small cell lung cancer    Cancer Staging  Small cell lung cancer (HCC) Staging form: Lung, AJCC 7th Edition - Clinical: Limited Stage Small cell Lung Cancer - Signed by Si Gaul, MD on 05/10/2013    Prior Therapy: 1. Concurrent chemoradiation with carboplatin and etoposide, completed 11/14/2009.  2. Prophylactic cranial irradiation, completed 01/28/2010. 3. RUL wedge resection on 09/21/2013 (Dr. Laneta Simmers) 4. 4 cycles of carboplatin and etoposide, 10/30/2013 - 01/10/2014  Current Therapy:  Carboplatin, etoposide and durvalumab    HISTORY OF PRESENT ILLNESS:   Oncology History  Small cell lung cancer (HCC)  04/10/2011 Initial Diagnosis   Small cell lung cancer (HCC)   08/17/2022 -  Chemotherapy   Patient is on Treatment Plan : LUNG SMALL CELL EXTENSIVE STAGE Durvalumab + Carboplatin D1 + Etoposide D1-3 q21d x 4 Cycles / Durvalumab q28d        INTERVAL HISTORY:   Claudia [name] is a 77 y.o. female presenting to clinic today for follow up of recurrent small cell lung cancer. She was last in clinic by Dr. Ellin Saba prior to cycle 2 of chemotherapy.  She is here with her husband today.  Reports overall doing well.  Appetite is 30% and energy levels are 40%.  Denies any pain.  Continues to have some constipation.   PAST MEDICAL HISTORY:   Past Medical History: Past Medical History:  Diagnosis Date   Achalasia    Anemia    Arthritis    Bradycardia    mild,may be due to beta blocker therapy   COPD (chronic obstructive pulmonary disease) (HCC)    Gastroparesis    GERD (gastroesophageal reflux disease)    otc   Hypertension 06/01/2019   pt denies   Local recurrence of lung cancer (HCC) dx'd 07/2013   rt thoracotomy chemo comp 12/2013   Lung cancer  (HCC) 03/30/2008   Dr. Arbutus Ped, finished chemo, sp radiation, left upper    Lymphocytic colitis    Pneumonia     Surgical History: Past Surgical History:  Procedure Laterality Date   BRONCHIAL BIOPSY  05/28/2022   Procedure: BRONCHIAL BIOPSIES;  Surgeon: Omar Person, MD;  Location[facility] ENDOSCOPY;  Service: Pulmonary;;   BRONCHIAL BRUSHINGS  05/28/2022   Procedure: BRONCHIAL BRUSHINGS;  Surgeon: Omar Person, MD;  Location[facility] ENDOSCOPY;  Service: Pulmonary;;   BRONCHIAL NEEDLE ASPIRATION BIOPSY  05/28/2022   Procedure: BRONCHIAL NEEDLE ASPIRATION BIOPSIES;  Surgeon: Omar Person, MD;  Location: [facility] ENDOSCOPY;  Service: Pulmonary;;   BRONCHIAL WASHINGS  05/28/2022   Procedure: BRONCHIAL WASHINGS;  Surgeon: Omar Person, MD;  Location[facility] ENDOSCOPY;  Service: Pulmonary;;   BRONCHOSCOPY  2011   DILATION AND CURETTAGE OF UTERUS     ESOPHAGOGASTRODUODENOSCOPY (EGD) WITH PROPOFOL N/A 07/29/2021   Procedure: ESOPHAGOGASTRODUODENOSCOPY (EGD) WITH PROPOFOL;  Surgeon: Levon Hedger  Alisia Ferrari, MD;  Location: [facility] ENDO SUITE;  Service: Gastroenterology;  Laterality: N/A;  1235 ASA 2   HEMOSTASIS CONTROL  05/28/2022   Procedure: HEMOSTASIS CONTROL;  Surgeon: Omar Person, MD;  Location: [facility] ENDOSCOPY;  Service: Pulmonary;;   IR IMAGING GUIDED PORT INSERTION  08/12/2022   NECK SURGERY  1980's   THORACOTOMY Right 09/21/2013   Procedure: THORACOTOMY MAJOR;  Surgeon: Alleen Borne, MD;  Location: [facility] OR;  Service: Thoracic;  Laterality: Right;   UTERINE FIBROID SURGERY  2012   WEDGE RESECTION Right 09/21/2013   Procedure: RIGHT UPPER LOBE WEDGE RESECTION;  Surgeon: Alleen Borne, MD;  Location: [facility] OR;  Service: Thoracic;  Laterality: Right;    Social History: Social History   Socioeconomic History   Marital status: Married    Spouse name: Not on file   Number of children: Not on file   Years of education: Not on file   Highest education level: Not on file  Occupational  History   Occupation: clerical    Employer: [employer]  Tobacco Use   Smoking status: Former    Packs/day: 1.00    Years: 35.00    Additional pack years: 0.00    Total pack years: 35.00    Types: Cigarettes    Quit date: 03/31/2007    Years since quitting: 15.4    Passive exposure: Past   Smokeless tobacco: Never  Vaping Use   Vaping Use: Never used  Substance and Sexual Activity   Alcohol use: Yes    Comment: occassional about 3 drinks per year   Drug use: No   Sexual activity: Never  Other Topics Concern   Not on file  Social History Narrative   Not on file   Social Determinants of Health   Financial Resource Strain: Not on file  Food Insecurity: No Food Insecurity (08/06/2022)   Hunger Vital Sign    Worried About Running Out of Food in the Last Year: Never true    Ran Out of Food in the Last Year: Never true  Transportation Needs: No Transportation Needs (08/06/2022)   PRAPARE - Administrator, Civil Service (Medical): No    Lack of Transportation (Non-Medical): No  Physical Activity: Not on file  Stress: Not on file  Social Connections: Not on file  Intimate Partner Violence: Not At Risk (08/06/2022)   Humiliation, Afraid, Rape, and Kick questionnaire    Fear of Current or Ex-Partner: No    Emotionally Abused: No    Physically Abused: No    Sexually Abused: No    Family History: Family History  Problem Relation Age of Onset   Brain cancer Mother        brain cancer   Emphysema Father    Diabetes Son    Heart disease Paternal Grandfather    Breast cancer Maternal Aunt     Current Medications:  Current Outpatient Medications:    Calcium Carbonate Antacid (TUMS PO), Take 500-1,000 mg by mouth 3 (three) times daily as needed (indigestion/heartburn.)., Disp: , Rfl:    Cholecalciferol (VITAMIN D-3) 25 MCG (1000 UT) CAPS, Take 1,000 Units by mouth in the morning., Disp: , Rfl:    Coenzyme Q10 100 MG TABS, Take 100 mg by mouth in the morning., Disp:  , Rfl:    ferrous sulfate 325 (65 FE) MG tablet, Take 1 tablet (325 mg total) by mouth daily., Disp: 30 tablet, Rfl: 3   lidocaine-prilocaine (EMLA) cream, Apply 1 Application topically  as needed., Disp: 30 g, Rfl: 1   lidocaine-prilocaine (EMLA) cream, Apply to affected area once, Disp: 30 g, Rfl: 3   magnesium oxide (MAG-OX) 400 (240 Mg) MG tablet, Take 1 tablet (400 mg total) by mouth daily., Disp: 60 tablet, Rfl: 2   metoCLOPramide (REGLAN) 5 MG tablet, Take 1 tablet (5 mg total) by mouth 3 (three) times daily before meals., Disp: 180 tablet, Rfl: 3   Multiple Vitamin (MULTIVITAMIN WITH MINERALS) TABS tablet, Take 1 tablet by mouth in the morning., Disp: , Rfl:    Probiotic Product (PROBIOTIC DAILY PO), Take 1 capsule by mouth in the morning., Disp: , Rfl:    prochlorperazine (COMPAZINE) 10 MG tablet, Take 1 tablet (10 mg total) by mouth every 6 (six) hours as needed for nausea or vomiting., Disp: 30 tablet, Rfl: 3   prochlorperazine (COMPAZINE) 10 MG tablet, Take 1 tablet (10 mg total) by mouth every 6 (six) hours as needed for nausea or vomiting., Disp: 60 tablet, Rfl: 4   rosuvastatin (CRESTOR) 20 MG tablet, Take 1 tablet (20 mg total) by mouth daily., Disp: 90 tablet, Rfl: 3   tretinoin (RETIN-A) 0.1 % cream, Apply 1 application  topically at bedtime., Disp: , Rfl:    Turmeric Curcumin 500 MG CAPS, Take 500 mg by mouth in the morning., Disp: , Rfl:    Allergies: Allergies  Allergen Reactions   Azithromycin Shortness Of Breath and Rash    Took Tussionex at same time Jan 2013.   Tussionex Pennkinetic Er [Hydrocod Poli-Chlorphe Poli Er] Shortness Of Breath and Rash    Took Azithromycin at same time Jan 2013   Omeprazole Rash    REVIEW OF SYSTEMS:   Review of Systems  Constitutional:  Positive for fatigue.  Gastrointestinal:  Positive for constipation.  Psychiatric/Behavioral:  Positive for sleep disturbance.      VITALS:   There were no vitals taken for this visit.  Wt  Readings from Last 3 Encounters:  09/14/22 96 lb 12.8 oz (43.9 kg)  09/07/22 94 lb 9.6 oz (42.9 kg)  08/17/22 96 lb 9.6 oz (43.8 kg)    There is no height or weight on file to calculate BMI.  Performance status (ECOG): 1 - Symptomatic but completely ambulatory  PHYSICAL EXAM:   Physical Exam Vitals reviewed.  Constitutional:      Appearance: Normal appearance.  Cardiovascular:     Rate and Rhythm: Normal rate and regular rhythm.  Pulmonary:     Effort: Pulmonary effort is normal.     Breath sounds: Normal breath sounds.  Abdominal:     General: Abdomen is flat. Bowel sounds are normal.     Palpations: Abdomen is soft.  Neurological:     Mental Status: She is alert and oriented to person, place, and time.     LABS:      Latest Ref Rng & Units 09/14/2022   10:38 AM 09/07/2022    9:03 AM 09/03/2022    9:01 AM  CBC  WBC 4.0 - 10.5 K/uL 7.8  13.3  6.7   Hemoglobin 12.0 - 15.0 g/dL 8.8  9.3  7.2   Hematocrit 36.0 - 46.0 % 27.1  27.8  21.8   Platelets 150 - 400 K/uL 233  79  38       Latest Ref Rng & Units 09/14/2022   10:38 AM 09/07/2022    9:03 AM 09/03/2022    9:01 AM  CMP  Glucose 70 - 99 mg/dL 161  096  94  BUN 8 - 23 mg/dL 18  16  11    Creatinine 0.44 - 1.00 mg/dL 4.09  8.11  9.14   Sodium 135 - 145 mmol/L 136  132  137   Potassium 3.5 - 5.1 mmol/L 4.1  3.7  3.4   Chloride 98 - 111 mmol/L 101  99  101   CO2 22 - 32 mmol/L 29  26  27    Calcium 8.9 - 10.3 mg/dL 9.5  9.1  9.1   Total Protein 6.5 - 8.1 g/dL 6.2  6.2  6.1   Total Bilirubin 0.3 - 1.2 mg/dL 0.5  0.8  0.4   Alkaline Phos 38 - 126 U/L 64  74  70   AST 15 - 41 U/L 14  24  13    ALT 0 - 44 U/L 12  11  10       No results found for: "CEA1", "CEA" / No results found for: "CEA1", "CEA" No results found for: "PSA1" No results found for: "NWG956" No results found for: "CAN125"  No results found for: "TOTALPROTELP", "ALBUMINELP", "A1GS", "A2GS", "BETS", "BETA2SER", "GAMS", "MSPIKE", "SPEI" Lab Results   Component Value Date   TIBC 238 (L) 08/17/2022   TIBC 149 (L) 10/08/2021   TIBC 159 (L) 04/10/2011   FERRITIN 296 08/17/2022   FERRITIN 675 (H) 10/08/2021   FERRITIN 1,680 (H) 04/10/2011   IRONPCTSAT 21 08/17/2022   IRONPCTSAT 9 (L) 10/08/2021   IRONPCTSAT 18 (L) 04/10/2011   No results found for: "LDH"   STUDIES:   No results found.

## 2022-09-22 ENCOUNTER — Inpatient Hospital Stay: Payer: Medicare PPO

## 2022-09-22 ENCOUNTER — Inpatient Hospital Stay (HOSPITAL_BASED_OUTPATIENT_CLINIC_OR_DEPARTMENT_OTHER): Payer: Medicare PPO | Admitting: Oncology

## 2022-09-22 VITALS — BP 100/41 | HR 101 | Temp 97.8°F | Resp 18 | Ht 62.0 in | Wt 95.6 lb

## 2022-09-22 VITALS — BP 100/65 | HR 78 | Temp 96.4°F | Resp 18

## 2022-09-22 DIAGNOSIS — Z5189 Encounter for other specified aftercare: Secondary | ICD-10-CM | POA: Diagnosis not present

## 2022-09-22 DIAGNOSIS — C3490 Malignant neoplasm of unspecified part of unspecified bronchus or lung: Secondary | ICD-10-CM

## 2022-09-22 DIAGNOSIS — C3411 Malignant neoplasm of upper lobe, right bronchus or lung: Secondary | ICD-10-CM | POA: Diagnosis not present

## 2022-09-22 DIAGNOSIS — T451X5A Adverse effect of antineoplastic and immunosuppressive drugs, initial encounter: Secondary | ICD-10-CM | POA: Diagnosis not present

## 2022-09-22 DIAGNOSIS — E860 Dehydration: Secondary | ICD-10-CM

## 2022-09-22 DIAGNOSIS — D649 Anemia, unspecified: Secondary | ICD-10-CM | POA: Diagnosis not present

## 2022-09-22 DIAGNOSIS — Z5111 Encounter for antineoplastic chemotherapy: Secondary | ICD-10-CM | POA: Diagnosis not present

## 2022-09-22 DIAGNOSIS — D6481 Anemia due to antineoplastic chemotherapy: Secondary | ICD-10-CM

## 2022-09-22 DIAGNOSIS — Z79899 Other long term (current) drug therapy: Secondary | ICD-10-CM | POA: Diagnosis not present

## 2022-09-22 DIAGNOSIS — Z95828 Presence of other vascular implants and grafts: Secondary | ICD-10-CM

## 2022-09-22 DIAGNOSIS — E876 Hypokalemia: Secondary | ICD-10-CM | POA: Diagnosis not present

## 2022-09-22 LAB — COMPREHENSIVE METABOLIC PANEL
ALT: 10 U/L (ref 0–44)
AST: 12 U/L — ABNORMAL LOW (ref 15–41)
Albumin: 2.8 g/dL — ABNORMAL LOW (ref 3.5–5.0)
Alkaline Phosphatase: 73 U/L (ref 38–126)
Anion gap: 5 (ref 5–15)
BUN: 21 mg/dL (ref 8–23)
CO2: 28 mmol/L (ref 22–32)
Calcium: 9.1 mg/dL (ref 8.9–10.3)
Chloride: 101 mmol/L (ref 98–111)
Creatinine, Ser: 0.72 mg/dL (ref 0.44–1.00)
GFR, Estimated: >60 mL/min (ref >60)
Glucose, Bld: 109 mg/dL — ABNORMAL HIGH (ref 70–99)
Potassium: 3.9 mmol/L (ref 3.5–5.1)
Sodium: 134 mmol/L — ABNORMAL LOW (ref 135–145)
Total Bilirubin: 0.9 mg/dL (ref 0.3–1.2)
Total Protein: 6 g/dL — ABNORMAL LOW (ref 6.5–8.1)

## 2022-09-22 LAB — CBC WITH DIFFERENTIAL/PLATELET
Abs Immature Granulocytes: 0 10*3/uL (ref 0.00–0.07)
Band Neutrophils: 2 %
Basophils Absolute: 0 10*3/uL (ref 0.0–0.1)
Basophils Relative: 2 %
Eosinophils Absolute: 0 10*3/uL (ref 0.0–0.5)
Eosinophils Relative: 0 %
HCT: 22.8 % — ABNORMAL LOW (ref 36.0–46.0)
Hemoglobin: 7.5 g/dL — ABNORMAL LOW (ref 12.0–15.0)
Lymphocytes Relative: 60 %
Lymphs Abs: 0.4 10*3/uL — ABNORMAL LOW (ref 0.7–4.0)
MCH: 31.4 pg (ref 26.0–34.0)
MCHC: 32.9 g/dL (ref 30.0–36.0)
MCV: 95.4 fL (ref 80.0–100.0)
Monocytes Absolute: 0 10*3/uL — ABNORMAL LOW (ref 0.1–1.0)
Monocytes Relative: 4 %
Neutro Abs: 0.2 10*3/uL — CL (ref 1.7–7.7)
Neutrophils Relative %: 32 %
Platelets: 55 10*3/uL — ABNORMAL LOW (ref 150–400)
RBC: 2.39 MIL/uL — ABNORMAL LOW (ref 3.87–5.11)
RDW: 14 % (ref 11.5–15.5)
WBC: 0.7 10*3/uL — CL (ref 4.0–10.5)
nRBC: 0 % (ref 0.0–0.2)

## 2022-09-22 LAB — TYPE AND SCREEN
ABO/RH(D): B POS
Antibody Screen: NEGATIVE
Unit division: 0

## 2022-09-22 LAB — PREPARE RBC (CROSSMATCH)

## 2022-09-22 LAB — MAGNESIUM: Magnesium: 1.7 mg/dL (ref 1.7–2.4)

## 2022-09-22 LAB — BPAM RBC: Unit Type and Rh: 1700

## 2022-09-22 MED ORDER — HEPARIN SOD (PORK) LOCK FLUSH 100 UNIT/ML IV SOLN
500.0000 [IU] | Freq: Every day | INTRAVENOUS | Status: AC | PRN
  Administered 2022-09-22: 500 [IU]

## 2022-09-22 MED ORDER — MAGNESIUM SULFATE 2 GM/50ML IV SOLN
2.0000 g | Freq: Once | INTRAVENOUS | Status: AC
  Administered 2022-09-22: 2 g via INTRAVENOUS
  Filled 2022-09-22: qty 50

## 2022-09-22 MED ORDER — SODIUM CHLORIDE 0.9% FLUSH
10.0000 mL | INTRAVENOUS | Status: AC | PRN
  Administered 2022-09-22: 10 mL

## 2022-09-22 MED ORDER — SODIUM CHLORIDE 0.9% IV SOLUTION
250.0000 mL | Freq: Once | INTRAVENOUS | Status: AC
  Administered 2022-09-22: 250 mL via INTRAVENOUS

## 2022-09-22 MED ORDER — SODIUM CHLORIDE 0.9% FLUSH
10.0000 mL | Freq: Once | INTRAVENOUS | Status: AC
  Administered 2022-09-22: 10 mL via INTRAVENOUS

## 2022-09-22 MED ORDER — DIPHENHYDRAMINE HCL 25 MG PO CAPS
25.0000 mg | ORAL_CAPSULE | Freq: Once | ORAL | Status: AC
  Administered 2022-09-22: 25 mg via ORAL
  Filled 2022-09-22: qty 1

## 2022-09-22 MED ORDER — POTASSIUM CHLORIDE IN NACL 20-0.9 MEQ/L-% IV SOLN
Freq: Once | INTRAVENOUS | Status: AC
  Filled 2022-09-22: qty 1000

## 2022-09-22 MED ORDER — ACETAMINOPHEN 325 MG PO TABS
650.0000 mg | ORAL_TABLET | Freq: Once | ORAL | Status: AC
  Administered 2022-09-22: 650 mg via ORAL
  Filled 2022-09-22: qty 2

## 2022-09-22 NOTE — Patient Instructions (Signed)
[facility]  Discharge Instructions: Thank you for choosing [facility] to provide your oncology and hematology care.  If you have a lab appointment with the Cancer Center - please note that after April 8th, 2024, all labs will be drawn in the cancer center.  You do not have to check in or register with the main entrance as you have in the past but will complete your check-in in the cancer center.  Wear comfortable clothing and clothing appropriate for easy access to any Portacath or PICC line.   We strive to give you quality time with your provider. You may need to reschedule your appointment if you arrive late (15 or more minutes).  Arriving late affects you and other patients whose appointments are after yours.  Also, if you miss three or more appointments without notifying the office, you may be dismissed from the clinic at the provider's discretion.      For prescription refill requests, have your pharmacy contact our office and allow 72 hours for refills to be completed.  To help prevent nausea and vomiting after your treatment, we encourage you to take your nausea medication as directed.  BELOW ARE SYMPTOMS THAT SHOULD BE REPORTED IMMEDIATELY: *FEVER GREATER THAN 100.4 F (38 C) OR HIGHER *CHILLS OR SWEATING *NAUSEA AND VOMITING THAT IS NOT CONTROLLED WITH YOUR NAUSEA MEDICATION *UNUSUAL SHORTNESS OF BREATH *UNUSUAL BRUISING OR BLEEDING *URINARY PROBLEMS (pain or burning when urinating, or frequent urination) *BOWEL PROBLEMS (unusual diarrhea, constipation, pain near the anus) TENDERNESS IN MOUTH AND THROAT WITH OR WITHOUT PRESENCE OF ULCERS (sore throat, sores in mouth, or a toothache) UNUSUAL RASH, SWELLING OR PAIN  UNUSUAL VAGINAL DISCHARGE OR ITCHING   Items with * indicate a potential emergency and should be followed up as soon as possible or go to the Emergency Department if any problems should occur.  Please show the CHEMOTHERAPY ALERT CARD or  IMMUNOTHERAPY ALERT CARD at check-in to the Emergency Department and triage nurse.  Should you have questions after your visit or need to cancel or reschedule your appointment, please contact [facility] [phone]  and follow the prompts.  Office hours are 8:00 a.m. to 4:30 p.m. Monday - Friday. Please note that voicemails left after 4:00 p.m. may not be returned until the following business day.  We are closed weekends and major holidays. You have access to a nurse at all times for urgent questions. Please call the main number to the clinic [phone] and follow the prompts.  For any non-urgent questions, you may also contact your provider using MyChart. We now offer e-Visits for anyone 18 and older to request care online for non-urgent symptoms. For details visit [email or web address].   Also download the MyChart app! Go to the app store, search "MyChart", open the app, select [facility], and log in with your MyChart username and password.   

## 2022-09-22 NOTE — Progress Notes (Unsigned)
CRITICAL VALUE ALERT Critical value received:  WBC 0.7, ANC 0.2 Date of notification:  09-22-22 Time of notification: 1018 Critical value read back:  Yes.   Nurse who received alert:  C. Minerva Bluett RN MD notified time and response:  Mignon Pine NP. 1019. No new orders   Hemoglobin is 7.5, will order one unit of blood today per orders.   Blood does not need to be irradiated per Dr. Ellin Saba.

## 2022-09-22 NOTE — Progress Notes (Unsigned)
Patient presents today for possible IVF.  Patient is in satisfactory condition with no new complaints voiced.  Vital signs are stable. Labs reviewed by Durenda Hurt, NP.  We will give 1 L of house IVF over 2 hours and one unit of PRBC today per NP.  Hemoglobin today is 7.5.    IV started in left [facility] for blood transfusion.  IV flushed well with good blood return noted.    Patient tolerated transfusion and IVF well with no complaints voiced.  Patient left via wheelchair in stable condition.  Vital signs stable at discharge.  Follow up as scheduled.

## 2022-09-23 LAB — TYPE AND SCREEN: Unit division: 0

## 2022-09-23 LAB — BPAM RBC
Blood Product Expiration Date: 202407182359
Blood Product Expiration Date: 202407182359
ISSUE DATE / TIME: 202406251119
Unit Type and Rh: 1700

## 2022-09-24 ENCOUNTER — Encounter: Payer: Self-pay | Admitting: Hematology

## 2022-09-28 ENCOUNTER — Inpatient Hospital Stay: Payer: Medicare PPO | Admitting: Hematology

## 2022-09-28 ENCOUNTER — Inpatient Hospital Stay: Payer: Medicare PPO

## 2022-09-29 ENCOUNTER — Inpatient Hospital Stay: Payer: Medicare PPO

## 2022-09-30 ENCOUNTER — Inpatient Hospital Stay: Payer: Medicare PPO

## 2022-10-02 ENCOUNTER — Inpatient Hospital Stay: Payer: Medicare PPO

## 2022-10-05 ENCOUNTER — Inpatient Hospital Stay: Payer: Medicare PPO

## 2022-10-05 ENCOUNTER — Inpatient Hospital Stay: Payer: Medicare PPO | Attending: Nurse Practitioner

## 2022-10-05 ENCOUNTER — Inpatient Hospital Stay (HOSPITAL_BASED_OUTPATIENT_CLINIC_OR_DEPARTMENT_OTHER): Payer: Medicare PPO | Admitting: Hematology

## 2022-10-05 VITALS — BP 105/52 | HR 95 | Temp 97.6°F | Resp 18

## 2022-10-05 DIAGNOSIS — D649 Anemia, unspecified: Secondary | ICD-10-CM | POA: Diagnosis not present

## 2022-10-05 DIAGNOSIS — K3184 Gastroparesis: Secondary | ICD-10-CM | POA: Diagnosis not present

## 2022-10-05 DIAGNOSIS — Z5189 Encounter for other specified aftercare: Secondary | ICD-10-CM | POA: Diagnosis not present

## 2022-10-05 DIAGNOSIS — C3411 Malignant neoplasm of upper lobe, right bronchus or lung: Secondary | ICD-10-CM | POA: Diagnosis not present

## 2022-10-05 DIAGNOSIS — C3490 Malignant neoplasm of unspecified part of unspecified bronchus or lung: Secondary | ICD-10-CM

## 2022-10-05 DIAGNOSIS — Z79899 Other long term (current) drug therapy: Secondary | ICD-10-CM | POA: Insufficient documentation

## 2022-10-05 DIAGNOSIS — Z5111 Encounter for antineoplastic chemotherapy: Secondary | ICD-10-CM | POA: Diagnosis not present

## 2022-10-05 DIAGNOSIS — T451X5A Adverse effect of antineoplastic and immunosuppressive drugs, initial encounter: Secondary | ICD-10-CM

## 2022-10-05 DIAGNOSIS — D6481 Anemia due to antineoplastic chemotherapy: Secondary | ICD-10-CM

## 2022-10-05 LAB — CBC WITH DIFFERENTIAL/PLATELET
Abs Immature Granulocytes: 0.14 10*3/uL — ABNORMAL HIGH (ref 0.00–0.07)
Basophils Absolute: 0.1 10*3/uL (ref 0.0–0.1)
Basophils Relative: 1 %
Eosinophils Absolute: 0 10*3/uL (ref 0.0–0.5)
Eosinophils Relative: 0 %
HCT: 29 % — ABNORMAL LOW (ref 36.0–46.0)
Hemoglobin: 9.3 g/dL — ABNORMAL LOW (ref 12.0–15.0)
Immature Granulocytes: 2 %
Lymphocytes Relative: 6 %
Lymphs Abs: 0.5 10*3/uL — ABNORMAL LOW (ref 0.7–4.0)
MCH: 30.9 pg (ref 26.0–34.0)
MCHC: 32.1 g/dL (ref 30.0–36.0)
MCV: 96.3 fL (ref 80.0–100.0)
Monocytes Absolute: 1 10*3/uL (ref 0.1–1.0)
Monocytes Relative: 11 %
Neutro Abs: 7.1 10*3/uL (ref 1.7–7.7)
Neutrophils Relative %: 80 %
Platelets: 138 10*3/uL — ABNORMAL LOW (ref 150–400)
RBC: 3.01 MIL/uL — ABNORMAL LOW (ref 3.87–5.11)
RDW: 15.9 % — ABNORMAL HIGH (ref 11.5–15.5)
WBC: 8.8 10*3/uL (ref 4.0–10.5)
nRBC: 0 % (ref 0.0–0.2)

## 2022-10-05 LAB — COMPREHENSIVE METABOLIC PANEL
ALT: 9 U/L (ref 0–44)
AST: 15 U/L (ref 15–41)
Albumin: 3 g/dL — ABNORMAL LOW (ref 3.5–5.0)
Alkaline Phosphatase: 85 U/L (ref 38–126)
Anion gap: 7 (ref 5–15)
BUN: 13 mg/dL (ref 8–23)
CO2: 26 mmol/L (ref 22–32)
Calcium: 9.2 mg/dL (ref 8.9–10.3)
Chloride: 104 mmol/L (ref 98–111)
Creatinine, Ser: 0.93 mg/dL (ref 0.44–1.00)
GFR, Estimated: >60 mL/min (ref >60)
Glucose, Bld: 125 mg/dL — ABNORMAL HIGH (ref 70–99)
Potassium: 3.9 mmol/L (ref 3.5–5.1)
Sodium: 137 mmol/L (ref 135–145)
Total Bilirubin: 0.4 mg/dL (ref 0.3–1.2)
Total Protein: 6.2 g/dL — ABNORMAL LOW (ref 6.5–8.1)

## 2022-10-05 LAB — SAMPLE TO BLOOD BANK

## 2022-10-05 LAB — TSH: TSH: 1.961 u[IU]/mL (ref 0.350–4.500)

## 2022-10-05 LAB — MAGNESIUM: Magnesium: 1.7 mg/dL (ref 1.7–2.4)

## 2022-10-05 MED ORDER — SODIUM CHLORIDE 0.9 % IV SOLN
10.0000 mg | Freq: Once | INTRAVENOUS | Status: AC
  Administered 2022-10-05: 10 mg via INTRAVENOUS
  Filled 2022-10-05: qty 10

## 2022-10-05 MED ORDER — SODIUM CHLORIDE 0.9% FLUSH
10.0000 mL | INTRAVENOUS | Status: DC | PRN
  Administered 2022-10-05: 10 mL

## 2022-10-05 MED ORDER — SODIUM CHLORIDE 0.9 % IV SOLN
1500.0000 mg | Freq: Once | INTRAVENOUS | Status: AC
  Administered 2022-10-05: 1500 mg via INTRAVENOUS
  Filled 2022-10-05: qty 30

## 2022-10-05 MED ORDER — SODIUM CHLORIDE 0.9 % IV SOLN
200.0000 mg | Freq: Once | INTRAVENOUS | Status: AC
  Administered 2022-10-05: 200 mg via INTRAVENOUS
  Filled 2022-10-05: qty 20

## 2022-10-05 MED ORDER — PALONOSETRON HCL INJECTION 0.25 MG/5ML
0.2500 mg | Freq: Once | INTRAVENOUS | Status: AC
  Administered 2022-10-05: 0.25 mg via INTRAVENOUS
  Filled 2022-10-05: qty 5

## 2022-10-05 MED ORDER — FAMOTIDINE IN NACL 20-0.9 MG/50ML-% IV SOLN
20.0000 mg | Freq: Once | INTRAVENOUS | Status: AC
  Administered 2022-10-05: 20 mg via INTRAVENOUS

## 2022-10-05 MED ORDER — CETIRIZINE HCL 10 MG/ML IV SOLN
10.0000 mg | Freq: Once | INTRAVENOUS | Status: AC
  Administered 2022-10-05: 10 mg via INTRAVENOUS
  Filled 2022-10-05: qty 1

## 2022-10-05 MED ORDER — HEPARIN SOD (PORK) LOCK FLUSH 100 UNIT/ML IV SOLN
500.0000 [IU] | Freq: Once | INTRAVENOUS | Status: AC | PRN
  Administered 2022-10-05: 500 [IU]

## 2022-10-05 MED ORDER — SODIUM CHLORIDE 0.9 % IV SOLN
150.0000 mg | Freq: Once | INTRAVENOUS | Status: AC
  Administered 2022-10-05: 150 mg via INTRAVENOUS
  Filled 2022-10-05: qty 150

## 2022-10-05 MED ORDER — SODIUM CHLORIDE 0.9 % IV SOLN: Freq: Once | INTRAVENOUS | Status: AC

## 2022-10-05 MED ORDER — SODIUM CHLORIDE 0.9 % IV SOLN
80.0000 mg/m2 | Freq: Once | INTRAVENOUS | Status: AC
  Administered 2022-10-05: 112 mg via INTRAVENOUS
  Filled 2022-10-05: qty 5.6

## 2022-10-05 NOTE — Progress Notes (Signed)
Patient presents today for Imfinzi/Carboplatin/Etoposide infusion per providers order.  Vital signs and labs reviewed by MD.  Message received from Chapman Moss RN/Dr. Ellin Saba patient okay for treatment.  Treatment given today per MD orders.  Stable during infusion without adverse affects.  Vital signs stable.  No complaints at this time.  Discharge from clinic ambulatory in stable condition.  Alert and oriented X 3.  Follow up with [facility] as scheduled.

## 2022-10-05 NOTE — Patient Instructions (Signed)
[facility]  Discharge Instructions: Thank you for choosing [facility] to provide your oncology and hematology care.  If you have a lab appointment with the Cancer Center - please note that after April 8th, 2024, all labs will be drawn in the cancer center.  You do not have to check in or register with the main entrance as you have in the past but will complete your check-in in the cancer center.  Wear comfortable clothing and clothing appropriate for easy access to any Portacath or PICC line.   We strive to give you quality time with your provider. You may need to reschedule your appointment if you arrive late (15 or more minutes).  Arriving late affects you and other patients whose appointments are after yours.  Also, if you miss three or more appointments without notifying the office, you may be dismissed from the clinic at the provider's discretion.      For prescription refill requests, have your pharmacy contact our office and allow 72 hours for refills to be completed.    Today you received the following chemotherapy and/or immunotherapy agents Imfinzi/Carboplatin/Etoposide      To help prevent nausea and vomiting after your treatment, we encourage you to take your nausea medication as directed.  BELOW ARE SYMPTOMS THAT SHOULD BE REPORTED IMMEDIATELY: *FEVER GREATER THAN 100.4 F (38 C) OR HIGHER *CHILLS OR SWEATING *NAUSEA AND VOMITING THAT IS NOT CONTROLLED WITH YOUR NAUSEA MEDICATION *UNUSUAL SHORTNESS OF BREATH *UNUSUAL BRUISING OR BLEEDING *URINARY PROBLEMS (pain or burning when urinating, or frequent urination) *BOWEL PROBLEMS (unusual diarrhea, constipation, pain near the anus) TENDERNESS IN MOUTH AND THROAT WITH OR WITHOUT PRESENCE OF ULCERS (sore throat, sores in mouth, or a toothache) UNUSUAL RASH, SWELLING OR PAIN  UNUSUAL VAGINAL DISCHARGE OR ITCHING   Items with * indicate a potential emergency and should be followed up as soon as  possible or go to the Emergency Department if any problems should occur.  Please show the CHEMOTHERAPY ALERT CARD or IMMUNOTHERAPY ALERT CARD at check-in to the Emergency Department and triage nurse.  Should you have questions after your visit or need to cancel or reschedule your appointment, please contact [facility] [phone]  and follow the prompts.  Office hours are 8:00 a.m. to 4:30 p.m. Monday - Friday. Please note that voicemails left after 4:00 p.m. may not be returned until the following business day.  We are closed weekends and major holidays. You have access to a nurse at all times for urgent questions. Please call the main number to the clinic [phone] and follow the prompts.  For any non-urgent questions, you may also contact your provider using MyChart. We now offer e-Visits for anyone 18 and older to request care online for non-urgent symptoms. For details visit [email or web address].   Also download the MyChart app! Go to the app store, search "MyChart", open the app, select [place], and log in with your MyChart username and password.   

## 2022-10-05 NOTE — Progress Notes (Signed)
[facility] [address] [place] [address]    Clinic Day:  10/05/2022  Referring physician: Ignatius Specking, MD  Patient Care Team: Ignatius Specking, MD as PCP - General (Internal Medicine) Pricilla Riffle, MD as PCP - Cardiology (Cardiology) Charna Elizabeth, MD as Consulting Physician (Gastroenterology) Doreatha Massed, MD as Medical Oncologist (Medical Oncology) Therese Sarah, RN as Oncology Nurse Navigator (Medical Oncology)   ASSESSMENT & PLAN:   Assessment: 1.  Recurrent extensive stage small cell lung cancer: - Patient seen at the request of Dr. Shirline Frees to get care closer to home. - 4 cycles of carboplatin and etoposide with concurrent radiation therapy for limited stage small cell lung cancer on 11/14/2009 - Status post PCI completed on 01/28/2010 - Wedge resection of the RUL by Dr. Lavinia Sharps on 09/20/2013, pathology consistent with small cell lung cancer. - 4 cycles of carboplatin and etoposide from 10/30/2013 through 01/08/2014 - PET scan on 04/23/2012: Marked hypermetabolism in the medial and anterior aspect of the left upper lobe.  Several hypermetabolic lung nodules in the left lung.  9 mm lesion in the left lower lobe.  No metastatic disease in the AP. - Bronchoscopy and FNA of the LUL nodule and LUL brushing and lavage with no malignant cells. - CT chest (08/03/2022): Scattered new nodular densities in the lungs bilaterally, largest in the left lower lobe.  New subcentimeter hypodense lesion in the left hepatic lobe 7 mm.  Periportal adenopathy 2.3 x 2.6 cm. - MRI of brain results from 08/14/2022: No metastatic disease.  Progression of small vessel ischemic disease. - PET scan from 08/13/2022 showed radiation changes in left upper lobe with focal hypermetabolism suggestive of residual recurrent tumor although this is improved from prior PET.  Dominant 8 mm subpleural nodule in the left lower lobe unchanged from recent CT and new from prior PET.  Metastatic disease  not excluded although infection/inflammation is also possible.  Prior hypermetabolic nodules on PET have resolved.  No evidence of metastatic disease in the abdomen or pelvis. - Cycle 1 of carboplatin/etoposide/durvalumab on 08/17/2022   2.  Social/family history: - She moved back to [place] recently and lives with her husband.  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  She also worked at [employer] for 15 years.  Quit smoking in 2010 and smoked 1 pack/day for 30 years. - Mother had brain tumor.  Maternal aunt had breast cancer.    Plan: 1.  Recurrent extensive stage small cell lung cancer: - Cycle 2 was on 09/14/2022. - She reported some fatigue but was able to feel better during week 3. - Reviewed labs today: Normal LFTs and creatinine.  CBC grossly normal with mild thrombocytopenia and anemia. - Recommend proceeding with cycle 3 today with dose reduction of VP-16 80 mg/m and flat dose carboplatin at 200 mg. - RTC 3 weeks for follow-up.  Plan to repeat CT chest with contrast prior to next visit.   2.  Gastroparesis: - Continue Reglan 5 mg 3 times daily before meals and eat multiple small meals.  Continue fair life.   3.  Hypomagnesemia: - Continue magnesium daily.  Magnesium today is normal.   4.  Anemia: - From myelosuppression.  Last transfusion on 09/22/2022, 1 unit.  Hemoglobin today is 9.3.  Will plan on checking CBC in 10 to 14 days for transfusion as needed.      Orders Placed This Encounter  Procedures   CT  Chest W Contrast    Standing Status:   Future    Standing Expiration Date:   10/05/2023    Order Specific Question:   If indicated for the ordered procedure, I authorize the administration of contrast media per Radiology protocol    Answer:   Yes    Order Specific Question:   Does the patient have a contrast media/X-ray dye allergy?    Answer:   No    Order Specific Question:   Preferred imaging location?    Answer:   [facility]    Magnesium    Standing Status:   Future    Standing Expiration Date:   11/16/2023   CBC with Differential    Standing Status:   Future    Standing Expiration Date:   11/16/2023   Comprehensive metabolic panel    Standing Status:   Future    Standing Expiration Date:   11/16/2023   Magnesium    Standing Status:   Future    Standing Expiration Date:   12/14/2023   CBC with Differential    Standing Status:   Future    Standing Expiration Date:   12/14/2023   Comprehensive metabolic panel    Standing Status:   Future    Standing Expiration Date:   12/14/2023   T4    Standing Status:   Future    Standing Expiration Date:   12/14/2023   TSH    Standing Status:   Future    Standing Expiration Date:   12/14/2023       Mikeal Hawthorne R Teague,acting as a scribe for Doreatha Massed, MD.,have documented all relevant documentation on the behalf of Doreatha Massed, MD,as directed by  Doreatha Massed, MD while in the presence of Doreatha Massed, MD.  I, Doreatha Massed MD, have reviewed the above documentation for accuracy and completeness, and I agree with the above.    Doreatha Massed, MD   7/8/20245:36 PM  CHIEF COMPLAINT:   Diagnosis: recurrent small cell lung cancer    Cancer Staging  Small cell lung cancer (HCC) Staging form: Lung, AJCC 7th Edition - Clinical: Limited Stage Small cell Lung Cancer - Signed by Si Gaul, MD on 05/10/2013    Prior Therapy: 1. Concurrent chemoradiation with carboplatin and etoposide, completed 11/14/2009.  2. Prophylactic cranial irradiation, completed 01/28/2010. 3. RUL wedge resection on 09/21/2013 (Dr. Laneta Simmers) 4. 4 cycles of carboplatin and etoposide, 10/30/2013 - 01/10/2014  Current Therapy:  Carboplatin, etoposide and durvalumab    HISTORY OF PRESENT ILLNESS:   Oncology History  Small cell lung cancer (HCC)  04/10/2011 Initial Diagnosis   Small cell lung cancer (HCC)   08/17/2022 -  Chemotherapy   Patient is on  Treatment Plan : LUNG SMALL CELL EXTENSIVE STAGE Durvalumab + Carboplatin D1 + Etoposide D1-3 q21d x 4 Cycles / Durvalumab q28d        INTERVAL HISTORY:   Claudia [name] is a 77 y.o. female presenting to clinic today for follow up of recurrent small cell lung cancer. She was last seen by me on 09/07/22  Today, she states that she is doing well overall. Her appetite level is at 70%. Her energy level is at 80%.  She notes appetite and energy levels were improved after previous treatment. She reports that she was given fluids and blood before treatment, which she believes attributed to her improved response.  She notes that she eats about 2.5 meals a day. She denies having diarrhea, joint pains, skin rashes, or SOB.  She reports she regularly takes magnesium pills.   PAST MEDICAL HISTORY:   Past Medical History: Past Medical History:  Diagnosis Date   Achalasia    Anemia    Arthritis    Bradycardia    mild,may be due to beta blocker therapy   COPD (chronic obstructive pulmonary disease) (HCC)    Gastroparesis    GERD (gastroesophageal reflux disease)    otc   Hypertension 06/01/2019   pt denies   Local recurrence of lung cancer (HCC) dx'd 07/2013   rt thoracotomy chemo comp 12/2013   Lung cancer (HCC) 03/30/2008   Dr. Arbutus Ped, finished chemo, sp radiation, left upper    Lymphocytic colitis    Pneumonia     Surgical History: Past Surgical History:  Procedure Laterality Date   BRONCHIAL BIOPSY  05/28/2022   Procedure: BRONCHIAL BIOPSIES;  Surgeon: Omar Person, MD;  Location: [facility] ENDOSCOPY;  Service: Pulmonary;;   BRONCHIAL BRUSHINGS  05/28/2022   Procedure: BRONCHIAL BRUSHINGS;  Surgeon: Omar Person, MD;  Location[facility] ENDOSCOPY;  Service: Pulmonary;;   BRONCHIAL NEEDLE ASPIRATION BIOPSY  05/28/2022   Procedure: BRONCHIAL NEEDLE ASPIRATION BIOPSIES;  Surgeon: Omar Person, MD;  Location: [facility] ENDOSCOPY;  Service: Pulmonary;;   BRONCHIAL WASHINGS  05/28/2022   Procedure:  BRONCHIAL WASHINGS;  Surgeon: Omar Person, MD;  Location: [facility] ENDOSCOPY;  Service: Pulmonary;;   BRONCHOSCOPY  2011   DILATION AND CURETTAGE OF UTERUS     ESOPHAGOGASTRODUODENOSCOPY (EGD) WITH PROPOFOL N/A 07/29/2021   Procedure: ESOPHAGOGASTRODUODENOSCOPY (EGD) WITH PROPOFOL;  Surgeon: Dolores Frame, MD;  Location: [facility] ENDO SUITE;  Service: Gastroenterology;  Laterality: N/A;  1235 ASA 2   HEMOSTASIS CONTROL  05/28/2022   Procedure: HEMOSTASIS CONTROL;  Surgeon: Omar Person, MD;  Location: [facility] ENDOSCOPY;  Service: Pulmonary;;   IR IMAGING GUIDED PORT INSERTION  08/12/2022   NECK SURGERY  1980's   THORACOTOMY Right 09/21/2013   Procedure: THORACOTOMY MAJOR;  Surgeon: Alleen Borne, MD;  Location: [facility] OR;  Service: Thoracic;  Laterality: Right;   UTERINE FIBROID SURGERY  2012   WEDGE RESECTION Right 09/21/2013   Procedure: RIGHT UPPER LOBE WEDGE RESECTION;  Surgeon: Alleen Borne, MD;  Location: [facility] OR;  Service: Thoracic;  Laterality: Right;    Social History: Social History   Socioeconomic History   Marital status: Married    Spouse name: Not on file   Number of children: Not on file   Years of education: Not on file   Highest education level: Not on file  Occupational History   Occupation: clerical    Employer: [employer]  Tobacco Use   Smoking status: Former    Packs/day: 1.00    Years: 35.00    Additional pack years: 0.00    Total pack years: 35.00    Types: Cigarettes    Quit date: 03/31/2007    Years since quitting: 15.5    Passive exposure: Past   Smokeless tobacco: Never  Vaping Use   Vaping Use: Never used  Substance and Sexual Activity   Alcohol use: Yes    Comment: occassional about 3 drinks per year   Drug use: No   Sexual activity: Never  Other Topics Concern   Not on file  Social History Narrative   Not on file   Social Determinants of Health   Financial Resource Strain: Not on file  Food Insecurity: No Food Insecurity (08/06/2022)    Hunger Vital Sign    Worried About Running Out  of Food in the Last Year: Never true    Ran Out of Food in the Last Year: Never true  Transportation Needs: No Transportation Needs (08/06/2022)   PRAPARE - Administrator, Civil Service (Medical): No    Lack of Transportation (Non-Medical): No  Physical Activity: Not on file  Stress: Not on file  Social Connections: Not on file  Intimate Partner Violence: Not At Risk (08/06/2022)   Humiliation, Afraid, Rape, and Kick questionnaire    Fear of Current or Ex-Partner: No    Emotionally Abused: No    Physically Abused: No    Sexually Abused: No    Family History: Family History  Problem Relation Age of Onset   Brain cancer Mother        brain cancer   Emphysema Father    Diabetes Son    Heart disease Paternal Grandfather    Breast cancer Maternal Aunt     Current Medications:  Current Outpatient Medications:    Calcium Carbonate Antacid (TUMS PO), Take 500-1,000 mg by mouth 3 (three) times daily as needed (indigestion/heartburn.)., Disp: , Rfl:    Cholecalciferol (VITAMIN D-3) 25 MCG (1000 UT) CAPS, Take 1,000 Units by mouth in the morning., Disp: , Rfl:    Coenzyme Q10 100 MG TABS, Take 100 mg by mouth in the morning., Disp: , Rfl:    ferrous sulfate 325 (65 FE) MG tablet, Take 1 tablet (325 mg total) by mouth daily., Disp: 30 tablet, Rfl: 3   lidocaine-prilocaine (EMLA) cream, Apply 1 Application topically as needed., Disp: 30 g, Rfl: 1   lidocaine-prilocaine (EMLA) cream, Apply to affected area once, Disp: 30 g, Rfl: 3   magnesium oxide (MAG-OX) 400 (240 Mg) MG tablet, Take 1 tablet (400 mg total) by mouth daily., Disp: 60 tablet, Rfl: 2   metoCLOPramide (REGLAN) 5 MG tablet, Take 1 tablet (5 mg total) by mouth 3 (three) times daily before meals., Disp: 180 tablet, Rfl: 3   Multiple Vitamin (MULTIVITAMIN WITH MINERALS) TABS tablet, Take 1 tablet by mouth in the morning., Disp: , Rfl:    Probiotic Product (PROBIOTIC DAILY  PO), Take 1 capsule by mouth in the morning., Disp: , Rfl:    prochlorperazine (COMPAZINE) 10 MG tablet, Take 1 tablet (10 mg total) by mouth every 6 (six) hours as needed for nausea or vomiting., Disp: 30 tablet, Rfl: 3   prochlorperazine (COMPAZINE) 10 MG tablet, Take 1 tablet (10 mg total) by mouth every 6 (six) hours as needed for nausea or vomiting., Disp: 60 tablet, Rfl: 4   rosuvastatin (CRESTOR) 20 MG tablet, Take 1 tablet (20 mg total) by mouth daily., Disp: 90 tablet, Rfl: 3   tretinoin (RETIN-A) 0.1 % cream, Apply 1 application  topically at bedtime., Disp: , Rfl:    Turmeric Curcumin 500 MG CAPS, Take 500 mg by mouth in the morning., Disp: , Rfl:  No current facility-administered medications for this visit.  Facility-Administered Medications Ordered in Other Visits:    sodium chloride flush (NS) 0.9 % injection 10 mL, 10 mL, Intracatheter, PRN, Doreatha Massed, MD, 10 mL at 10/05/22 1528   Allergies: Allergies  Allergen Reactions   Azithromycin Shortness Of Breath and Rash    Took Tussionex at same time Jan 2013.   Tussionex Pennkinetic Er [Hydrocod Poli-Chlorphe Poli Er] Shortness Of Breath and Rash    Took Azithromycin at same time Jan 2013   Omeprazole Rash    REVIEW OF SYSTEMS:   Review of Systems  Constitutional:  Negative for chills, fatigue and fever.  HENT:   Negative for lump/mass, mouth sores, nosebleeds, sore throat and trouble swallowing.   Eyes:  Negative for eye problems.  Respiratory:  Negative for cough and shortness of breath.   Cardiovascular:  Negative for chest pain, leg swelling and palpitations.  Gastrointestinal:  Negative for abdominal pain, constipation, diarrhea, nausea and vomiting.  Genitourinary:  Negative for bladder incontinence, difficulty urinating, dysuria, frequency, hematuria and nocturia.   Musculoskeletal:  Negative for arthralgias, back pain, flank pain, myalgias and neck pain.  Skin:  Negative for itching and rash.   Neurological:  Negative for dizziness, headaches and numbness.  Hematological:  Does not bruise/bleed easily.  Psychiatric/Behavioral:  Positive for sleep disturbance. Negative for depression and suicidal ideas. The patient is not nervous/anxious.   All other systems reviewed and are negative.    VITALS:   There were no vitals taken for this visit.  Wt Readings from Last 3 Encounters:  10/05/22 94 lb 9.6 oz (42.9 kg)  09/22/22 95 lb 9.6 oz (43.4 kg)  09/14/22 96 lb 12.8 oz (43.9 kg)    There is no height or weight on file to calculate BMI.  Performance status (ECOG): 1 - Symptomatic but completely ambulatory  PHYSICAL EXAM:   Physical Exam Vitals and nursing note reviewed. Exam conducted with a chaperone present.  Constitutional:      Appearance: Normal appearance.  Cardiovascular:     Rate and Rhythm: Normal rate and regular rhythm.     Pulses: Normal pulses.     Heart sounds: Normal heart sounds.  Pulmonary:     Effort: Pulmonary effort is normal.     Breath sounds: Normal breath sounds.  Abdominal:     Palpations: Abdomen is soft. There is no hepatomegaly, splenomegaly or mass.     Tenderness: There is no abdominal tenderness.  Musculoskeletal:     Right lower leg: No edema.     Left lower leg: No edema.  Lymphadenopathy:     Cervical: No cervical adenopathy.     Right cervical: No superficial, deep or posterior cervical adenopathy.    Left cervical: No superficial, deep or posterior cervical adenopathy.     Upper Body:     Right upper body: No supraclavicular or axillary adenopathy.     Left upper body: No supraclavicular or axillary adenopathy.  Neurological:     General: No focal deficit present.     Mental Status: She is alert and oriented to person, place, and time.  Psychiatric:        Mood and Affect: Mood normal.        Behavior: Behavior normal.     LABS:      Latest Ref Rng & Units 10/05/2022    9:49 AM 09/22/2022    8:51 AM 09/14/2022   10:38 AM   CBC  WBC 4.0 - 10.5 K/uL 8.8  0.7  7.8   Hemoglobin 12.0 - 15.0 g/dL 9.3  7.5  8.8   Hematocrit 36.0 - 46.0 % 29.0  22.8  27.1   Platelets 150 - 400 K/uL 138  55  233       Latest Ref Rng & Units 10/05/2022    9:49 AM 09/22/2022    8:51 AM 09/14/2022   10:38 AM  CMP  Glucose 70 - 99 mg/dL 782  956  213   BUN 8 - 23 mg/dL 13  21  18    Creatinine 0.44 - 1.00 mg/dL 0.86  0.72  0.86   Sodium 135 - 145 mmol/L 137  134  136   Potassium 3.5 - 5.1 mmol/L 3.9  3.9  4.1   Chloride 98 - 111 mmol/L 104  101  101   CO2 22 - 32 mmol/L 26  28  29    Calcium 8.9 - 10.3 mg/dL 9.2  9.1  9.5   Total Protein 6.5 - 8.1 g/dL 6.2  6.0  6.2   Total Bilirubin 0.3 - 1.2 mg/dL 0.4  0.9  0.5   Alkaline Phos 38 - 126 U/L 85  73  64   AST 15 - 41 U/L 15  12  14    ALT 0 - 44 U/L 9  10  12       No results found for: "CEA1", "CEA" / No results found for: "CEA1", "CEA" No results found for: "PSA1" No results found for: "ZOX096" No results found for: "CAN125"  No results found for: "TOTALPROTELP", "ALBUMINELP", "A1GS", "A2GS", "BETS", "BETA2SER", "GAMS", "MSPIKE", "SPEI" Lab Results  Component Value Date   TIBC 238 (L) 08/17/2022   TIBC 149 (L) 10/08/2021   TIBC 159 (L) 04/10/2011   FERRITIN 296 08/17/2022   FERRITIN 675 (H) 10/08/2021   FERRITIN 1,680 (H) 04/10/2011   IRONPCTSAT 21 08/17/2022   IRONPCTSAT 9 (L) 10/08/2021   IRONPCTSAT 18 (L) 04/10/2011   No results found for: "LDH"   STUDIES:   No results found.

## 2022-10-05 NOTE — Patient Instructions (Signed)
[facility] Discharge Instructions   You were seen and examined today by Dr. Katragadda.  He reviewed the results of your lab work which are normal/stable.   We will proceed with your treatment today.  Return as scheduled.    Thank you for choosing [facility] to provide your oncology and hematology care.  To afford each patient quality time with our provider, please arrive at least 15 minutes before your scheduled appointment time.   If you have a lab appointment with the Cancer Center please come in thru the Main Entrance and check in at the main information desk.  You need to re-schedule your appointment should you arrive 10 or more minutes late.  We strive to give you quality time with our providers, and arriving late affects you and other patients whose appointments are after yours.  Also, if you no show three or more times for appointments you may be dismissed from the clinic at the providers discretion.     Again, thank you for choosing [facility].  Our hope is that these requests will decrease the amount of time that you wait before being seen by our physicians.       _____________________________________________________________  Should you have questions after your visit to [facility], please contact our office at [phone] and follow the prompts.  Our office hours are 8:00 a.m. and 4:30 p.m. Monday - Friday.  Please note that voicemails left after 4:00 p.m. may not be returned until the following business day.  We are closed weekends and major holidays.  You do have access to a nurse 24-7, just call the main number to the clinic [phone] and do not press any options, hold on the line and a nurse will answer the phone.    For prescription refill requests, have your pharmacy contact our office and allow 72 hours.    Due to Covid, you will need to wear a mask upon entering  the hospital. If you do not have a mask, a mask will be given to you at the Main Entrance upon arrival. For doctor visits, patients may have 1 support person age 18 or older with them. For treatment visits, patients can not have anyone with them due to social distancing guidelines and our immunocompromised population.      

## 2022-10-06 ENCOUNTER — Inpatient Hospital Stay (HOSPITAL_BASED_OUTPATIENT_CLINIC_OR_DEPARTMENT_OTHER): Payer: Medicare PPO

## 2022-10-06 VITALS — BP 121/59 | HR 90 | Temp 97.6°F | Resp 18

## 2022-10-06 DIAGNOSIS — Z79899 Other long term (current) drug therapy: Secondary | ICD-10-CM | POA: Diagnosis not present

## 2022-10-06 DIAGNOSIS — K3184 Gastroparesis: Secondary | ICD-10-CM | POA: Diagnosis not present

## 2022-10-06 DIAGNOSIS — Z5189 Encounter for other specified aftercare: Secondary | ICD-10-CM | POA: Diagnosis not present

## 2022-10-06 DIAGNOSIS — D649 Anemia, unspecified: Secondary | ICD-10-CM | POA: Diagnosis not present

## 2022-10-06 DIAGNOSIS — C3490 Malignant neoplasm of unspecified part of unspecified bronchus or lung: Secondary | ICD-10-CM

## 2022-10-06 DIAGNOSIS — C3411 Malignant neoplasm of upper lobe, right bronchus or lung: Secondary | ICD-10-CM | POA: Diagnosis not present

## 2022-10-06 DIAGNOSIS — Z5111 Encounter for antineoplastic chemotherapy: Secondary | ICD-10-CM | POA: Diagnosis not present

## 2022-10-06 MED ORDER — SODIUM CHLORIDE 0.9 % IV SOLN
80.0000 mg/m2 | Freq: Once | INTRAVENOUS | Status: AC
  Administered 2022-10-06: 112 mg via INTRAVENOUS
  Filled 2022-10-06: qty 5.6

## 2022-10-06 MED ORDER — HEPARIN SOD (PORK) LOCK FLUSH 100 UNIT/ML IV SOLN
500.0000 [IU] | Freq: Once | INTRAVENOUS | Status: AC | PRN
  Administered 2022-10-06: 500 [IU]

## 2022-10-06 MED ORDER — SODIUM CHLORIDE 0.9% FLUSH
10.0000 mL | INTRAVENOUS | Status: DC | PRN
  Administered 2022-10-06: 10 mL

## 2022-10-06 MED ORDER — SODIUM CHLORIDE 0.9 % IV SOLN
10.0000 mg | Freq: Once | INTRAVENOUS | Status: AC
  Administered 2022-10-06: 10 mg via INTRAVENOUS
  Filled 2022-10-06: qty 1

## 2022-10-06 MED ORDER — SODIUM CHLORIDE 0.9 % IV SOLN: Freq: Once | INTRAVENOUS | Status: AC

## 2022-10-06 NOTE — Patient Instructions (Signed)
[facility]  Discharge Instructions: Thank you for choosing [facility] to provide your oncology and hematology care.  If you have a lab appointment with the Cancer Center - please note that after April 8th, 2024, all labs will be drawn in the cancer center.  You do not have to check in or register with the main entrance as you have in the past but will complete your check-in in the cancer center.  Wear comfortable clothing and clothing appropriate for easy access to any Portacath or PICC line.   We strive to give you quality time with your provider. You may need to reschedule your appointment if you arrive late (15 or more minutes).  Arriving late affects you and other patients whose appointments are after yours.  Also, if you miss three or more appointments without notifying the office, you may be dismissed from the clinic at the provider's discretion.      For prescription refill requests, have your pharmacy contact our office and allow 72 hours for refills to be completed.  To help prevent nausea and vomiting after your treatment, we encourage you to take your nausea medication as directed.  BELOW ARE SYMPTOMS THAT SHOULD BE REPORTED IMMEDIATELY: *FEVER GREATER THAN 100.4 F (38 C) OR HIGHER *CHILLS OR SWEATING *NAUSEA AND VOMITING THAT IS NOT CONTROLLED WITH YOUR NAUSEA MEDICATION *UNUSUAL SHORTNESS OF BREATH *UNUSUAL BRUISING OR BLEEDING *URINARY PROBLEMS (pain or burning when urinating, or frequent urination) *BOWEL PROBLEMS (unusual diarrhea, constipation, pain near the anus) TENDERNESS IN MOUTH AND THROAT WITH OR WITHOUT PRESENCE OF ULCERS (sore throat, sores in mouth, or a toothache) UNUSUAL RASH, SWELLING OR PAIN  UNUSUAL VAGINAL DISCHARGE OR ITCHING   Items with * indicate a potential emergency and should be followed up as soon as possible or go to the Emergency Department if any problems should occur.  Please show the CHEMOTHERAPY ALERT CARD or  IMMUNOTHERAPY ALERT CARD at check-in to the Emergency Department and triage nurse.  Should you have questions after your visit or need to cancel or reschedule your appointment, please contact [facility] [phone]  and follow the prompts.  Office hours are 8:00 a.m. to 4:30 p.m. Monday - Friday. Please note that voicemails left after 4:00 p.m. may not be returned until the following business day.  We are closed weekends and major holidays. You have access to a nurse at all times for urgent questions. Please call the main number to the clinic [phone] and follow the prompts.  For any non-urgent questions, you may also contact your provider using MyChart. We now offer e-Visits for anyone 18 and older to request care online for non-urgent symptoms. For details visit [email or web address].   Also download the MyChart app! Go to the app store, search "MyChart", open the app, select [facility], and log in with your MyChart username and password.   

## 2022-10-06 NOTE — Progress Notes (Signed)
Patient tolerated chemotherapy with no complaints voiced.  Side effects with management reviewed with understanding verbalized.  Port site clean and dry with no bruising or swelling noted at site.  Good blood return noted before and after administration of chemotherapy.  Band aid applied.  Patient left in satisfactory condition with VSS and no s/s of distress noted.   

## 2022-10-07 ENCOUNTER — Inpatient Hospital Stay: Payer: Medicare PPO

## 2022-10-07 VITALS — BP 109/86 | HR 90 | Temp 97.4°F | Resp 18

## 2022-10-07 DIAGNOSIS — K3184 Gastroparesis: Secondary | ICD-10-CM | POA: Diagnosis not present

## 2022-10-07 DIAGNOSIS — Z5111 Encounter for antineoplastic chemotherapy: Secondary | ICD-10-CM | POA: Diagnosis not present

## 2022-10-07 DIAGNOSIS — Z79899 Other long term (current) drug therapy: Secondary | ICD-10-CM | POA: Diagnosis not present

## 2022-10-07 DIAGNOSIS — Z5189 Encounter for other specified aftercare: Secondary | ICD-10-CM | POA: Diagnosis not present

## 2022-10-07 DIAGNOSIS — C3411 Malignant neoplasm of upper lobe, right bronchus or lung: Secondary | ICD-10-CM | POA: Diagnosis not present

## 2022-10-07 DIAGNOSIS — D649 Anemia, unspecified: Secondary | ICD-10-CM | POA: Diagnosis not present

## 2022-10-07 DIAGNOSIS — C3490 Malignant neoplasm of unspecified part of unspecified bronchus or lung: Secondary | ICD-10-CM

## 2022-10-07 LAB — T4: T4, Total: 7.2 ug/dL (ref 4.5–12.0)

## 2022-10-07 MED ORDER — SODIUM CHLORIDE 0.9% FLUSH
10.0000 mL | INTRAVENOUS | Status: DC | PRN
  Administered 2022-10-07: 10 mL

## 2022-10-07 MED ORDER — HEPARIN SOD (PORK) LOCK FLUSH 100 UNIT/ML IV SOLN
500.0000 [IU] | Freq: Once | INTRAVENOUS | Status: AC | PRN
  Administered 2022-10-07: 500 [IU]

## 2022-10-07 MED ORDER — SODIUM CHLORIDE 0.9 % IV SOLN
10.0000 mg | Freq: Once | INTRAVENOUS | Status: AC
  Administered 2022-10-07: 10 mg via INTRAVENOUS
  Filled 2022-10-07: qty 10

## 2022-10-07 MED ORDER — SODIUM CHLORIDE 0.9 % IV SOLN: Freq: Once | INTRAVENOUS | Status: AC

## 2022-10-07 MED ORDER — SODIUM CHLORIDE 0.9 % IV SOLN
80.0000 mg/m2 | Freq: Once | INTRAVENOUS | Status: AC
  Administered 2022-10-07: 112 mg via INTRAVENOUS
  Filled 2022-10-07: qty 5.6

## 2022-10-07 NOTE — Patient Instructions (Signed)
[facility]  Discharge Instructions: Thank you for choosing [facility] to provide your oncology and hematology care.  If you have a lab appointment with the Cancer Center - please note that after April 8th, 2024, all labs will be drawn in the cancer center.  You do not have to check in or register with the main entrance as you have in the past but will complete your check-in in the cancer center.  Wear comfortable clothing and clothing appropriate for easy access to any Portacath or PICC line.   We strive to give you quality time with your provider. You may need to reschedule your appointment if you arrive late (15 or more minutes).  Arriving late affects you and other patients whose appointments are after yours.  Also, if you miss three or more appointments without notifying the office, you may be dismissed from the clinic at the provider's discretion.      For prescription refill requests, have your pharmacy contact our office and allow 72 hours for refills to be completed.    Today you received the following chemotherapy and/or immunotherapy agents Etoposide.  Etoposide Capsules What is this medication? ETOPOSIDE (e toe POE side) treats lung cancer. It works by slowing down the growth of cancer cells. This medicine may be used for other purposes; ask your health care provider or pharmacist if you have questions. COMMON BRAND NAME(S): VePesid What should I tell my care team before I take this medication? They need to know if you have any of these conditions: Infection Kidney disease Liver disease Low blood counts, such as low white cell, platelet, red cell counts An unusual or allergic reaction to etoposide, other medications, foods, dyes, or preservatives Pregnant or trying to get pregnant Breastfeeding How should I use this medication? Take this medication by mouth with a glass of water. Take it as directed on the prescription label. Do not cut,  crush, or chew this medication. Swallow the capsules whole. Do not take it more often than directed. Keep taking it unless your care team tells you to stop. Handling this medication may be harmful. Wear gloves while touching this medication or bottle. Talk to your care team about how to handle this medication. Special instructions may apply. Talk to your care team about the use of this medication in children. Special care may be needed. Overdosage: If you think you have taken too much of this medicine contact a poison control center or emergency room at once. NOTE: This medicine is only for you. Do not share this medicine with others. What if I miss a dose? If you miss a dose, take it as soon as you can. If it is almost time for your next dose, take only that dose. Do not take double or extra doses. What may interact with this medication? Cyclosporine Warfarin This list may not describe all possible interactions. Give your health care provider a list of all the medicines, herbs, non-prescription drugs, or dietary supplements you use. Also tell them if you smoke, drink alcohol, or use illegal drugs. Some items may interact with your medicine. What should I watch for while using this medication? Your condition will be monitored carefully while you are receiving this medication. This medication may make you feel generally unwell. This is not uncommon as chemotherapy can affect healthy cells as well as cancer cells. Report any side effects. Continue your course of treatment even though you feel ill unless your care team tells you  to stop. This medication may increase your risk of getting an infection. Call your care team for advice if you get a fever, chills, sore throat, or other symptoms of a cold or flu. Do not treat yourself. Try to avoid being around people who are sick. This medication may increase your risk to bruise or bleed. Call your care team if you notice any unusual bleeding. Talk to your  care team about your risk of cancer. You may be more at risk for certain types of cancers if you take this medication. Talk to your care team if you may be pregnant. Serious birth defects can occur if you take this medication during pregnancy and for 6 months after the last dose. You will need a negative pregnancy test before starting this medication. Contraception is recommended while taking this medication and for 6 months after the last dose. Your care team can help you find the option that works for you. If your partner can get pregnant, use a condom during sex while taking this medication and for 4 months after the last dose. Do not breastfeed while taking this medication. This medication may cause infertility. Talk to your care team if you are concerned about your fertility. What side effects may I notice from receiving this medication? Side effects that you should report to your care team as soon as possible: Allergic reactions--skin rash, itching, hives, swelling of the face, lips, tongue, or throat Infection--fever, chills, cough, sore throat, wounds that don't heal, pain or trouble when passing urine, general feeling of discomfort or being unwell Low red blood cell level--unusual weakness or fatigue, dizziness, headache, trouble breathing Unusual bruising or bleeding Side effects that usually do not require medical attention (report to your care team if they continue or are bothersome): Diarrhea Fatigue Hair loss Loss of appetite Nausea Pain, redness, or swelling with sores inside the mouth or throat Vomiting This list may not describe all possible side effects. Call your doctor for medical advice about side effects. You may report side effects to FDA at 1-800-FDA-1088. Where should I keep my medication? Keep out of the reach of children and pets. Store in a refrigerator between 2 and 8 degrees C (36 and 46 degrees F). Do not freeze. Get rid any unused medication after the expiration  date. To get rid of medications that are no longer needed or have expired: Take the medication to a medication take-back program. Check with your pharmacy or law enforcement to find a location. If you cannot return the medication, ask your pharmacist or care team how to get rid of this medication safely. NOTE: This sheet is a summary. It may not cover all possible information. If you have questions about this medicine, talk to your doctor, pharmacist, or health care provider.  2024 Elsevier/Gold Standard (2021-08-07 00:00:00)       To help prevent nausea and vomiting after your treatment, we encourage you to take your nausea medication as directed.  BELOW ARE SYMPTOMS THAT SHOULD BE REPORTED IMMEDIATELY: *FEVER GREATER THAN 100.4 F (38 C) OR HIGHER *CHILLS OR SWEATING *NAUSEA AND VOMITING THAT IS NOT CONTROLLED WITH YOUR NAUSEA MEDICATION *UNUSUAL SHORTNESS OF BREATH *UNUSUAL BRUISING OR BLEEDING *URINARY PROBLEMS (pain or burning when urinating, or frequent urination) *BOWEL PROBLEMS (unusual diarrhea, constipation, pain near the anus) TENDERNESS IN MOUTH AND THROAT WITH OR WITHOUT PRESENCE OF ULCERS (sore throat, sores in mouth, or a toothache) UNUSUAL RASH, SWELLING OR PAIN  UNUSUAL VAGINAL DISCHARGE OR ITCHING   Items with *  indicate a potential emergency and should be followed up as soon as possible or go to the Emergency Department if any problems should occur.  Please show the CHEMOTHERAPY ALERT CARD or IMMUNOTHERAPY ALERT CARD at check-in to the Emergency Department and triage nurse.  Should you have questions after your visit or need to cancel or reschedule your appointment, please contact [facility] [phone]  and follow the prompts.  Office hours are 8:00 a.m. to 4:30 p.m. Monday - Friday. Please note that voicemails left after 4:00 p.m. may not be returned until the following business day.  We are closed weekends and major holidays. You have access to  a nurse at all times for urgent questions. Please call the main number to the clinic [phone] and follow the prompts.  For any non-urgent questions, you may also contact your provider using MyChart. We now offer e-Visits for anyone 56 and older to request care online for non-urgent symptoms. For details visit [email or web address].   Also download the MyChart app! Go to the app store, search "MyChart", open the app, select [place], and log in with your MyChart username and password.

## 2022-10-07 NOTE — Progress Notes (Signed)
Patient presents today for D3C3 Etoposide tx. Vital signs are within parameters for treatment. Patient denies any changes since her last treatment yesterday.    Treatment given today per MD orders. Tolerated infusion without adverse affects. Vital signs stable. No complaints at this time. Discharged from clinic ambulatory in stable condition. Alert and oriented x 3. F/U with [facility] as scheduled.

## 2022-10-09 ENCOUNTER — Inpatient Hospital Stay: Payer: Medicare PPO

## 2022-10-09 VITALS — BP 111/55 | HR 88 | Temp 97.1°F | Resp 18

## 2022-10-09 DIAGNOSIS — D649 Anemia, unspecified: Secondary | ICD-10-CM | POA: Diagnosis not present

## 2022-10-09 DIAGNOSIS — K3184 Gastroparesis: Secondary | ICD-10-CM | POA: Diagnosis not present

## 2022-10-09 DIAGNOSIS — Z5189 Encounter for other specified aftercare: Secondary | ICD-10-CM | POA: Diagnosis not present

## 2022-10-09 DIAGNOSIS — C3411 Malignant neoplasm of upper lobe, right bronchus or lung: Secondary | ICD-10-CM | POA: Diagnosis not present

## 2022-10-09 DIAGNOSIS — C3490 Malignant neoplasm of unspecified part of unspecified bronchus or lung: Secondary | ICD-10-CM

## 2022-10-09 DIAGNOSIS — Z79899 Other long term (current) drug therapy: Secondary | ICD-10-CM | POA: Diagnosis not present

## 2022-10-09 DIAGNOSIS — Z5111 Encounter for antineoplastic chemotherapy: Secondary | ICD-10-CM | POA: Diagnosis not present

## 2022-10-09 MED ORDER — PEGFILGRASTIM-CBQV 6 MG/0.6ML ~~LOC~~ SOSY
6.0000 mg | PREFILLED_SYRINGE | Freq: Once | SUBCUTANEOUS | Status: AC
  Administered 2022-10-09: 6 mg via SUBCUTANEOUS
  Filled 2022-10-09: qty 0.6

## 2022-10-09 NOTE — Progress Notes (Signed)
Patient tolerated injection with no complaints voiced.  Site clean and dry with no bruising or swelling noted at site.  See MAR for details.  Band aid applied.  Patient stable during and after injection.  Vss with discharge and left in satisfactory condition with no s/s of distress noted.  

## 2022-10-09 NOTE — Patient Instructions (Signed)
[facility] [place]  Discharge Instructions: Thank you for choosing [facility] to provide your oncology and hematology care.  If you have a lab appointment with the Cancer Center - please note that after April 8th, 2024, all labs will be drawn in the cancer center.  You do not have to check in or register with the main entrance as you have in the past but will complete your check-in in the cancer center.  Wear comfortable clothing and clothing appropriate for easy access to any Portacath or PICC line.   We strive to give you quality time with your provider. You may need to reschedule your appointment if you arrive late (15 or more minutes).  Arriving late affects you and other patients whose appointments are after yours.  Also, if you miss three or more appointments without notifying the office, you may be dismissed from the clinic at the provider's discretion.      For prescription refill requests, have your pharmacy contact our office and allow 72 hours for refills to be completed.  To help prevent nausea and vomiting after your treatment, we encourage you to take your nausea medication as directed.  BELOW ARE SYMPTOMS THAT SHOULD BE REPORTED IMMEDIATELY: *FEVER GREATER THAN 100.4 F (38 C) OR HIGHER *CHILLS OR SWEATING *NAUSEA AND VOMITING THAT IS NOT CONTROLLED WITH YOUR NAUSEA MEDICATION *UNUSUAL SHORTNESS OF BREATH *UNUSUAL BRUISING OR BLEEDING *URINARY PROBLEMS (pain or burning when urinating, or frequent urination) *BOWEL PROBLEMS (unusual diarrhea, constipation, pain near the anus) TENDERNESS IN MOUTH AND THROAT WITH OR WITHOUT PRESENCE OF ULCERS (sore throat, sores in mouth, or a toothache) UNUSUAL RASH, SWELLING OR PAIN  UNUSUAL VAGINAL DISCHARGE OR ITCHING   Items with * indicate a potential emergency and should be followed up as soon as possible or go to the Emergency Department if any problems should occur.  Please show the CHEMOTHERAPY ALERT CARD or  IMMUNOTHERAPY ALERT CARD at check-in to the Emergency Department and triage nurse.  Should you have questions after your visit or need to cancel or reschedule your appointment, please contact [facility] [place] [phone]  and follow the prompts.  Office hours are 8:00 a.m. to 4:30 p.m. Monday - Friday. Please note that voicemails left after 4:00 p.m. may not be returned until the following business day.  We are closed weekends and major holidays. You have access to a nurse at all times for urgent questions. Please call the main number to the clinic [phone] and follow the prompts.  For any non-urgent questions, you may also contact your provider using MyChart. We now offer e-Visits for anyone 18 and older to request care online for non-urgent symptoms. For details visit [email or web address].   Also download the MyChart app! Go to the app store, search "MyChart", open the app, select [place], and log in with your MyChart username and password.   

## 2022-10-15 ENCOUNTER — Other Ambulatory Visit: Payer: Self-pay

## 2022-10-15 ENCOUNTER — Encounter (INDEPENDENT_AMBULATORY_CARE_PROVIDER_SITE_OTHER): Payer: Medicare PPO | Admitting: Gastroenterology

## 2022-10-15 DIAGNOSIS — D6481 Anemia due to antineoplastic chemotherapy: Secondary | ICD-10-CM

## 2022-10-15 DIAGNOSIS — C3490 Malignant neoplasm of unspecified part of unspecified bronchus or lung: Secondary | ICD-10-CM

## 2022-10-20 ENCOUNTER — Ambulatory Visit: Payer: Medicare PPO

## 2022-10-20 ENCOUNTER — Inpatient Hospital Stay: Payer: Medicare PPO

## 2022-10-20 ENCOUNTER — Other Ambulatory Visit: Payer: Self-pay | Admitting: *Deleted

## 2022-10-20 ENCOUNTER — Other Ambulatory Visit: Payer: Medicare PPO

## 2022-10-20 ENCOUNTER — Ambulatory Visit: Payer: Medicare PPO | Admitting: Hematology

## 2022-10-20 VITALS — BP 134/56 | HR 108 | Temp 96.8°F | Resp 18 | Wt 101.4 lb

## 2022-10-20 DIAGNOSIS — C3490 Malignant neoplasm of unspecified part of unspecified bronchus or lung: Secondary | ICD-10-CM

## 2022-10-20 DIAGNOSIS — Z5111 Encounter for antineoplastic chemotherapy: Secondary | ICD-10-CM | POA: Diagnosis not present

## 2022-10-20 DIAGNOSIS — Z95828 Presence of other vascular implants and grafts: Secondary | ICD-10-CM

## 2022-10-20 DIAGNOSIS — T451X5A Adverse effect of antineoplastic and immunosuppressive drugs, initial encounter: Secondary | ICD-10-CM

## 2022-10-20 DIAGNOSIS — Z5189 Encounter for other specified aftercare: Secondary | ICD-10-CM | POA: Diagnosis not present

## 2022-10-20 DIAGNOSIS — C3411 Malignant neoplasm of upper lobe, right bronchus or lung: Secondary | ICD-10-CM | POA: Diagnosis not present

## 2022-10-20 DIAGNOSIS — D649 Anemia, unspecified: Secondary | ICD-10-CM | POA: Diagnosis not present

## 2022-10-20 DIAGNOSIS — Z79899 Other long term (current) drug therapy: Secondary | ICD-10-CM | POA: Diagnosis not present

## 2022-10-20 DIAGNOSIS — K3184 Gastroparesis: Secondary | ICD-10-CM | POA: Diagnosis not present

## 2022-10-20 LAB — CBC WITH DIFFERENTIAL/PLATELET
Abs Immature Granulocytes: 0.78 10*3/uL — ABNORMAL HIGH (ref 0.00–0.07)
Basophils Absolute: 0.1 10*3/uL (ref 0.0–0.1)
Basophils Relative: 1 %
Eosinophils Absolute: 0 10*3/uL (ref 0.0–0.5)
Eosinophils Relative: 0 %
HCT: 21.8 % — ABNORMAL LOW (ref 36.0–46.0)
Hemoglobin: 6.9 g/dL — CL (ref 12.0–15.0)
Immature Granulocytes: 4 %
Lymphocytes Relative: 3 %
Lymphs Abs: 0.7 10*3/uL (ref 0.7–4.0)
MCH: 31.1 pg (ref 26.0–34.0)
MCHC: 31.7 g/dL (ref 30.0–36.0)
MCV: 98.2 fL (ref 80.0–100.0)
Monocytes Absolute: 2.4 10*3/uL — ABNORMAL HIGH (ref 0.1–1.0)
Monocytes Relative: 11 %
Neutro Abs: 17.5 10*3/uL — ABNORMAL HIGH (ref 1.7–7.7)
Neutrophils Relative %: 81 %
Platelets: 40 10*3/uL — ABNORMAL LOW (ref 150–400)
RBC: 2.22 MIL/uL — ABNORMAL LOW (ref 3.87–5.11)
RDW: 18.5 % — ABNORMAL HIGH (ref 11.5–15.5)
WBC: 21.4 10*3/uL — ABNORMAL HIGH (ref 4.0–10.5)
nRBC: 0.1 % (ref 0.0–0.2)

## 2022-10-20 LAB — COMPREHENSIVE METABOLIC PANEL
ALT: 14 U/L (ref 0–44)
AST: 15 U/L (ref 15–41)
Albumin: 3.4 g/dL — ABNORMAL LOW (ref 3.5–5.0)
Alkaline Phosphatase: 119 U/L (ref 38–126)
Anion gap: 7 (ref 5–15)
BUN: 13 mg/dL (ref 8–23)
CO2: 25 mmol/L (ref 22–32)
Calcium: 9 mg/dL (ref 8.9–10.3)
Chloride: 104 mmol/L (ref 98–111)
Creatinine, Ser: 0.84 mg/dL (ref 0.44–1.00)
GFR, Estimated: >60 mL/min (ref >60)
Glucose, Bld: 101 mg/dL — ABNORMAL HIGH (ref 70–99)
Potassium: 3.8 mmol/L (ref 3.5–5.1)
Sodium: 136 mmol/L (ref 135–145)
Total Bilirubin: 0.8 mg/dL (ref 0.3–1.2)
Total Protein: 6.4 g/dL — ABNORMAL LOW (ref 6.5–8.1)

## 2022-10-20 LAB — MAGNESIUM: Magnesium: 1.6 mg/dL — ABNORMAL LOW (ref 1.7–2.4)

## 2022-10-20 LAB — TYPE AND SCREEN
Antibody Screen: NEGATIVE
Unit division: 0

## 2022-10-20 LAB — BPAM RBC: Blood Product Expiration Date: 202408302359

## 2022-10-20 LAB — SAMPLE TO BLOOD BANK

## 2022-10-20 LAB — PREPARE RBC (CROSSMATCH)

## 2022-10-20 MED ORDER — HEPARIN SOD (PORK) LOCK FLUSH 100 UNIT/ML IV SOLN
500.0000 [IU] | Freq: Once | INTRAVENOUS | Status: AC
  Administered 2022-10-20: 500 [IU] via INTRAVENOUS

## 2022-10-20 MED ORDER — SODIUM CHLORIDE 0.9% FLUSH
10.0000 mL | INTRAVENOUS | Status: DC | PRN
  Administered 2022-10-20: 10 mL via INTRAVENOUS

## 2022-10-20 MED ORDER — FUROSEMIDE 20 MG PO TABS: 20.0000 mg | ORAL_TABLET | Freq: Every day | ORAL | 1 refills | Status: DC | PRN

## 2022-10-20 NOTE — Progress Notes (Signed)
Patients port flushed without difficulty.  Good blood return noted with no bruising or swelling noted at site.  Band aid applied.  Patient c/o bilateral lower extremity swelling x one week.  MD made aware.  Lasix PRN was sent to patient's pharmacy.  Patient was educated on this by Chapman Moss, RN.  VSS with discharge and left in satisfactory condition with no s/s of distress noted.

## 2022-10-20 NOTE — Addendum Note (Signed)
Addended by: Harrel Lemon on: 10/20/2022 01:40 PM   Modules accepted: Orders

## 2022-10-20 NOTE — Progress Notes (Signed)
CRITICAL VALUE ALERT Critical value received:  hgb 6.9 Date of notification:  10-20-22 Time of notification: 1244 Critical value read back:  Yes.   Nurse who received alert:  C. Jacquelynn Friend RN MD notified time and response:  1248, Dr. Ellin Saba, will order 2 units per MD.

## 2022-10-21 ENCOUNTER — Ambulatory Visit: Payer: Medicare PPO

## 2022-10-21 ENCOUNTER — Ambulatory Visit (HOSPITAL_COMMUNITY)
Admission: RE | Admit: 2022-10-21 | Discharge: 2022-10-21 | Disposition: A | Discharge status: Home / Self Care | Payer: Medicare PPO | Source: Ambulatory Visit | Attending: Hematology | Admitting: Hematology

## 2022-10-21 ENCOUNTER — Inpatient Hospital Stay: Payer: Medicare PPO

## 2022-10-21 DIAGNOSIS — J90 Pleural effusion, not elsewhere classified: Secondary | ICD-10-CM | POA: Diagnosis not present

## 2022-10-21 DIAGNOSIS — C3432 Malignant neoplasm of lower lobe, left bronchus or lung: Secondary | ICD-10-CM | POA: Diagnosis not present

## 2022-10-21 DIAGNOSIS — C3490 Malignant neoplasm of unspecified part of unspecified bronchus or lung: Secondary | ICD-10-CM | POA: Insufficient documentation

## 2022-10-21 DIAGNOSIS — T451X5A Adverse effect of antineoplastic and immunosuppressive drugs, initial encounter: Secondary | ICD-10-CM

## 2022-10-21 DIAGNOSIS — J432 Centrilobular emphysema: Secondary | ICD-10-CM | POA: Diagnosis not present

## 2022-10-21 DIAGNOSIS — I700 Atherosclerosis of aorta: Secondary | ICD-10-CM | POA: Diagnosis not present

## 2022-10-21 LAB — TYPE AND SCREEN
ABO/RH(D): B POS
ABO/RH(D): B POS
ABO/RH(D): B POS
Antibody Screen: NEGATIVE
Unit division: 0
Unit division: 0
Unit division: 0

## 2022-10-21 LAB — BPAM RBC
Blood Product Expiration Date: 202408282359
ISSUE DATE / TIME: 202407241142
Unit Type and Rh: 1700
Unit Type and Rh: 1700
Unit Type and Rh: 1700

## 2022-10-21 LAB — PREPARE RBC (CROSSMATCH)

## 2022-10-21 MED ORDER — IOHEXOL 300 MG/ML  SOLN
75.0000 mL | Freq: Once | INTRAMUSCULAR | Status: AC | PRN
  Administered 2022-10-21: 75 mL via INTRAVENOUS

## 2022-10-21 MED ORDER — SODIUM CHLORIDE 0.9% FLUSH
10.0000 mL | INTRAVENOUS | Status: AC | PRN
  Administered 2022-10-21: 10 mL

## 2022-10-21 MED ORDER — DIPHENHYDRAMINE HCL 25 MG PO CAPS
25.0000 mg | ORAL_CAPSULE | Freq: Once | ORAL | Status: AC
  Administered 2022-10-21: 25 mg via ORAL
  Filled 2022-10-21: qty 1

## 2022-10-21 MED ORDER — ACETAMINOPHEN 325 MG PO TABS
650.0000 mg | ORAL_TABLET | Freq: Once | ORAL | Status: AC
  Administered 2022-10-21: 650 mg via ORAL
  Filled 2022-10-21: qty 2

## 2022-10-21 MED ORDER — HEPARIN SOD (PORK) LOCK FLUSH 100 UNIT/ML IV SOLN
500.0000 [IU] | Freq: Every day | INTRAVENOUS | Status: AC | PRN
  Administered 2022-10-21: 500 [IU]

## 2022-10-21 MED ORDER — SODIUM CHLORIDE 0.9% IV SOLUTION
250.0000 mL | Freq: Once | INTRAVENOUS | Status: AC
  Administered 2022-10-21: 250 mL via INTRAVENOUS

## 2022-10-21 NOTE — Progress Notes (Signed)
Patient tolerated transfusion with no complaints voiced.  Side effects with management reviewed with understanding verbalized.  Port site clean and dry with no bruising or swelling noted at site.  Good blood return noted before and after administration.  Band aid applied.  Patient left in satisfactory condition with VSS and no s/s of distress noted.   ?

## 2022-10-21 NOTE — Patient Instructions (Signed)

## 2022-10-22 ENCOUNTER — Ambulatory Visit: Payer: Medicare PPO

## 2022-10-22 LAB — TYPE AND SCREEN: Antibody Screen: NEGATIVE

## 2022-10-22 LAB — BPAM RBC
Blood Product Expiration Date: 202408282359
Blood Product Expiration Date: 202408302359
ISSUE DATE / TIME: 202407241002
Unit Type and Rh: 1700

## 2022-10-23 ENCOUNTER — Ambulatory Visit: Payer: Medicare PPO

## 2022-10-25 NOTE — Progress Notes (Signed)
[facility] [address] [place] [address]    Clinic Day:  10/25/2022  Referring physician: Ignatius Specking, MD  Patient Care Team: Ignatius Specking, MD as PCP - General (Internal Medicine) Pricilla Riffle, MD as PCP - Cardiology (Cardiology) Charna Elizabeth, MD as Consulting Physician (Gastroenterology) Doreatha Massed, MD as Medical Oncologist (Medical Oncology) Therese Sarah, RN as Oncology Nurse Navigator (Medical Oncology)   ASSESSMENT & PLAN:   Assessment: 1.  Recurrent extensive stage small cell lung cancer: - Patient seen at the request of Dr. Shirline Frees to get care closer to home. - 4 cycles of carboplatin and etoposide with concurrent radiation therapy for limited stage small cell lung cancer on 11/14/2009 - Status post PCI completed on 01/28/2010 - Wedge resection of the RUL by Dr. Lavinia Sharps on 09/20/2013, pathology consistent with small cell lung cancer. - 4 cycles of carboplatin and etoposide from 10/30/2013 through 01/08/2014 - PET scan on 04/23/2012: Marked hypermetabolism in the medial and anterior aspect of the left upper lobe.  Several hypermetabolic lung nodules in the left lung.  9 mm lesion in the left lower lobe.  No metastatic disease in the AP. - Bronchoscopy and FNA of the LUL nodule and LUL brushing and lavage with no malignant cells. - CT chest (08/03/2022): Scattered new nodular densities in the lungs bilaterally, largest in the left lower lobe.  New subcentimeter hypodense lesion in the left hepatic lobe 7 mm.  Periportal adenopathy 2.3 x 2.6 cm. - MRI of brain results from 08/14/2022: No metastatic disease.  Progression of small vessel ischemic disease. - PET scan from 08/13/2022 showed radiation changes in left upper lobe with focal hypermetabolism suggestive of residual recurrent tumor although this is improved from prior PET.  Dominant 8 mm subpleural nodule in the left lower lobe unchanged from recent CT and new from prior PET.  Metastatic  disease not excluded although infection/inflammation is also possible.  Prior hypermetabolic nodules on PET have resolved.  No evidence of metastatic disease in the abdomen or pelvis. - Cycle 1 of carboplatin/etoposide/durvalumab on 08/17/2022   2.  Social/family history: - She moved back to [place] recently and lives with her husband.  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  She also worked at [employer] for 15 years.  Quit smoking in 2010 and smoked 1 pack/day for 30 years. - Mother had brain tumor.  Maternal aunt had breast cancer.    Plan: 1.  Recurrent extensive stage small cell lung cancer: - Cycle 2 was on 09/14/2022. - She reported some fatigue but was able to feel better during week 3. - Reviewed labs today: Normal LFTs and creatinine.  CBC grossly normal with mild thrombocytopenia and anemia. - Recommend proceeding with cycle 3 today with dose reduction of VP-16 80 mg/m and flat dose carboplatin at 200 mg. - RTC 3 weeks for follow-up.  Plan to repeat CT chest with contrast prior to next visit.   2.  Gastroparesis: - Continue Reglan 5 mg 3 times daily before meals and eat multiple small meals.  Continue fair life.   3.  Hypomagnesemia: - Continue magnesium daily.  Magnesium today is normal.   4.  Anemia: - From myelosuppression.  Last transfusion on 09/22/2022, 1 unit.  Hemoglobin today is 9.3.  Will plan on checking CBC in 10 to 14 days for transfusion as needed.      No orders of the defined types were placed in  this encounter.      Alben Deeds Teague,acting as a Neurosurgeon for Doreatha Massed, MD.,have documented all relevant documentation on the behalf of Doreatha Massed, MD,as directed by  Doreatha Massed, MD while in the presence of Doreatha Massed, MD.  ***    [place] R Teague   7/28/202410:35 PM  CHIEF COMPLAINT:   Diagnosis: recurrent small cell lung cancer    Cancer Staging  Small cell lung cancer [facility]) Staging  form: Lung, AJCC 7th Edition - Clinical: Limited Stage Small cell Lung Cancer - Signed by Si Gaul, MD on 05/10/2013    Prior Therapy: 1. Concurrent chemoradiation with carboplatin and etoposide, completed 11/14/2009.  2. Prophylactic cranial irradiation, completed 01/28/2010. 3. RUL wedge resection on 09/21/2013 (Dr. Laneta Simmers) 4. 4 cycles of carboplatin and etoposide, 10/30/2013 - 01/10/2014  Current Therapy:  Carboplatin, etoposide and durvalumab    HISTORY OF PRESENT ILLNESS:   Oncology History  Small cell lung cancer (HCC)  04/10/2011 Initial Diagnosis   Small cell lung cancer (HCC)   08/17/2022 -  Chemotherapy   Patient is on Treatment Plan : LUNG SMALL CELL EXTENSIVE STAGE Durvalumab + Carboplatin D1 + Etoposide D1-3 q21d x 4 Cycles / Durvalumab q28d        INTERVAL HISTORY:   Shelie is a 77 y.o. female presenting to clinic today for follow up of recurrent small cell lung cancer. She was last seen by me on 10/05/22.  She underwent a chest CT on 7/24 that found: ***  Today, she states that she is doing well overall. Her appetite level is at ***%. Her energy level is at ***%.  PAST MEDICAL HISTORY:   Past Medical History: Past Medical History:  Diagnosis Date   Achalasia    Anemia    Arthritis    Bradycardia    mild,may be due to beta blocker therapy   COPD (chronic obstructive pulmonary disease) (HCC)    Gastroparesis    GERD (gastroesophageal reflux disease)    otc   Hypertension 06/01/2019   pt denies   Local recurrence of lung cancer (HCC) dx'd 07/2013   rt thoracotomy chemo comp 12/2013   Lung cancer (HCC) 03/30/2008   Dr. Arbutus Ped, finished chemo, sp radiation, left upper    Lymphocytic colitis    Pneumonia     Surgical History: Past Surgical History:  Procedure Laterality Date   BRONCHIAL BIOPSY  05/28/2022   Procedure: BRONCHIAL BIOPSIES;  Surgeon: Omar Person, MD;  Location: [facility] ENDOSCOPY;  Service: Pulmonary;;   BRONCHIAL BRUSHINGS  05/28/2022    Procedure: BRONCHIAL BRUSHINGS;  Surgeon: Omar Person, MD;  Location: [facility] ENDOSCOPY;  Service: Pulmonary;;   BRONCHIAL NEEDLE ASPIRATION BIOPSY  05/28/2022   Procedure: BRONCHIAL NEEDLE ASPIRATION BIOPSIES;  Surgeon: Omar Person, MD;  Location: [facility] ENDOSCOPY;  Service: Pulmonary;;   BRONCHIAL WASHINGS  05/28/2022   Procedure: BRONCHIAL WASHINGS;  Surgeon: Omar Person, MD;  Location[facility] ENDOSCOPY;  Service: Pulmonary;;   BRONCHOSCOPY  2011   DILATION AND CURETTAGE OF UTERUS     ESOPHAGOGASTRODUODENOSCOPY (EGD) WITH PROPOFOL N/A 07/29/2021   Procedure: ESOPHAGOGASTRODUODENOSCOPY (EGD) WITH PROPOFOL;  Surgeon: Dolores Frame, MD;  Location: [facility] ENDO SUITE;  Service: Gastroenterology;  Laterality: N/A;  1235 ASA 2   HEMOSTASIS CONTROL  05/28/2022   Procedure: HEMOSTASIS CONTROL;  Surgeon: Omar Person, MD;  Location: [facility] ENDOSCOPY;  Service: Pulmonary;;   IR IMAGING GUIDED PORT INSERTION  08/12/2022   NECK SURGERY  1980's   THORACOTOMY Right 09/21/2013  Procedure: THORACOTOMY MAJOR;  Surgeon: Alleen Borne, MD;  Location: [facility] OR;  Service: Thoracic;  Laterality: Right;   UTERINE FIBROID SURGERY  2012   WEDGE RESECTION Right 09/21/2013   Procedure: RIGHT UPPER LOBE WEDGE RESECTION;  Surgeon: Alleen Borne, MD;  Location: [facility] OR;  Service: Thoracic;  Laterality: Right;    Social History: Social History   Socioeconomic History   Marital status: Married    Spouse name: Not on file   Number of children: Not on file   Years of education: Not on file   Highest education level: Not on file  Occupational History   Occupation: clerical    Employer: [employer]   Tobacco Use   Smoking status: Former    Current packs/day: 0.00    Average packs/day: 1 pack/day for 35.0 years (35.0 ttl pk-yrs)    Types: Cigarettes    Start date: 03/30/1972    Quit date: 03/31/2007    Years since quitting: 15.5    Passive exposure: Past   Smokeless tobacco: Never  Vaping Use    Vaping status: Never Used  Substance and Sexual Activity   Alcohol use: Yes    Comment: occassional about 3 drinks per year   Drug use: No   Sexual activity: Never  Other Topics Concern   Not on file  Social History Narrative   Not on file   Social Determinants of Health   Financial Resource Strain: Not on file  Food Insecurity: No Food Insecurity (08/06/2022)   Hunger Vital Sign    Worried About Running Out of Food in the Last Year: Never true    Ran Out of Food in the Last Year: Never true  Transportation Needs: No Transportation Needs (08/06/2022)   PRAPARE - Administrator, Civil Service (Medical): No    Lack of Transportation (Non-Medical): No  Physical Activity: Not on file  Stress: Not on file  Social Connections: Not on file  Intimate Partner Violence: Not At Risk (08/06/2022)   Humiliation, Afraid, Rape, and Kick questionnaire    Fear of Current or Ex-Partner: No    Emotionally Abused: No    Physically Abused: No    Sexually Abused: No    Family History: Family History  Problem Relation Age of Onset   Brain cancer Mother        brain cancer   Emphysema Father    Diabetes Son    Heart disease Paternal Grandfather    Breast cancer Maternal Aunt     Current Medications:  Current Outpatient Medications:    Calcium Carbonate Antacid (TUMS PO), Take 500-1,000 mg by mouth 3 (three) times daily as needed (indigestion/heartburn.)., Disp: , Rfl:    Cholecalciferol (VITAMIN D-3) 25 MCG (1000 UT) CAPS, Take 1,000 Units by mouth in the morning., Disp: , Rfl:    Coenzyme Q10 100 MG TABS, Take 100 mg by mouth in the morning., Disp: , Rfl:    ferrous sulfate 325 (65 FE) MG tablet, Take 1 tablet (325 mg total) by mouth daily., Disp: 30 tablet, Rfl: 3   furosemide (LASIX) 20 MG tablet, Take 1 tablet (20 mg total) by mouth daily as needed., Disp: 30 tablet, Rfl: 1   lidocaine-prilocaine (EMLA) cream, Apply 1 Application topically as needed., Disp: 30 g, Rfl: 1    lidocaine-prilocaine (EMLA) cream, Apply to affected area once, Disp: 30 g, Rfl: 3   magnesium oxide (MAG-OX) 400 (240 Mg) MG tablet, Take 1 tablet (400 mg total) by mouth  daily., Disp: 60 tablet, Rfl: 2   metoCLOPramide (REGLAN) 5 MG tablet, Take 1 tablet (5 mg total) by mouth 3 (three) times daily before meals., Disp: 180 tablet, Rfl: 3   Multiple Vitamin (MULTIVITAMIN WITH MINERALS) TABS tablet, Take 1 tablet by mouth in the morning., Disp: , Rfl:    Probiotic Product (PROBIOTIC DAILY PO), Take 1 capsule by mouth in the morning., Disp: , Rfl:    prochlorperazine (COMPAZINE) 10 MG tablet, Take 1 tablet (10 mg total) by mouth every 6 (six) hours as needed for nausea or vomiting., Disp: 30 tablet, Rfl: 3   prochlorperazine (COMPAZINE) 10 MG tablet, Take 1 tablet (10 mg total) by mouth every 6 (six) hours as needed for nausea or vomiting., Disp: 60 tablet, Rfl: 4   rosuvastatin (CRESTOR) 20 MG tablet, Take 1 tablet (20 mg total) by mouth daily., Disp: 90 tablet, Rfl: 3   tretinoin (RETIN-A) 0.1 % cream, Apply 1 application  topically at bedtime., Disp: , Rfl:    Turmeric Curcumin 500 MG CAPS, Take 500 mg by mouth in the morning., Disp: , Rfl:    Allergies: Allergies  Allergen Reactions   Azithromycin Shortness Of Breath and Rash    Took Tussionex at same time Jan 2013.   Tussionex Pennkinetic Er [Hydrocod Poli-Chlorphe Poli Er] Shortness Of Breath and Rash    Took Azithromycin at same time Jan 2013   Omeprazole Rash    REVIEW OF SYSTEMS:   Review of Systems  Constitutional:  Negative for chills, fatigue and fever.  HENT:   Negative for lump/mass, mouth sores, nosebleeds, sore throat and trouble swallowing.   Eyes:  Negative for eye problems.  Respiratory:  Negative for cough and shortness of breath.   Cardiovascular:  Negative for chest pain, leg swelling and palpitations.  Gastrointestinal:  Negative for abdominal pain, constipation, diarrhea, nausea and vomiting.  Genitourinary:   Negative for bladder incontinence, difficulty urinating, dysuria, frequency, hematuria and nocturia.   Musculoskeletal:  Negative for arthralgias, back pain, flank pain, myalgias and neck pain.  Skin:  Negative for itching and rash.  Neurological:  Negative for dizziness, headaches and numbness.  Hematological:  Does not bruise/bleed easily.  Psychiatric/Behavioral:  Negative for depression, sleep disturbance and suicidal ideas. The patient is not nervous/anxious.   All other systems reviewed and are negative.    VITALS:   There were no vitals taken for this visit.  Wt Readings from Last 3 Encounters:  10/20/22 101 lb 6.4 oz (46 kg)  10/05/22 94 lb 9.6 oz (42.9 kg)  09/22/22 95 lb 9.6 oz (43.4 kg)    There is no height or weight on file to calculate BMI.  Performance status (ECOG): 1 - Symptomatic but completely ambulatory  PHYSICAL EXAM:   Physical Exam Vitals and nursing note reviewed. Exam conducted with a chaperone present.  Constitutional:      Appearance: Normal appearance.  Cardiovascular:     Rate and Rhythm: Normal rate and regular rhythm.     Pulses: Normal pulses.     Heart sounds: Normal heart sounds.  Pulmonary:     Effort: Pulmonary effort is normal.     Breath sounds: Normal breath sounds.  Abdominal:     Palpations: Abdomen is soft. There is no hepatomegaly, splenomegaly or mass.     Tenderness: There is no abdominal tenderness.  Musculoskeletal:     Right lower leg: No edema.     Left lower leg: No edema.  Lymphadenopathy:     Cervical:  No cervical adenopathy.     Right cervical: No superficial, deep or posterior cervical adenopathy.    Left cervical: No superficial, deep or posterior cervical adenopathy.     Upper Body:     Right upper body: No supraclavicular or axillary adenopathy.     Left upper body: No supraclavicular or axillary adenopathy.  Neurological:     General: No focal deficit present.     Mental Status: She is alert and oriented to  person, place, and time.  Psychiatric:        Mood and Affect: Mood normal.        Behavior: Behavior normal.     LABS:      Latest Ref Rng & Units 10/20/2022   12:01 PM 10/05/2022    9:49 AM 09/22/2022    8:51 AM  CBC  WBC 4.0 - 10.5 K/uL 21.4  8.8  0.7   Hemoglobin 12.0 - 15.0 g/dL 6.9  9.3  7.5   Hematocrit 36.0 - 46.0 % 21.8  29.0  22.8   Platelets 150 - 400 K/uL 40  138  55       Latest Ref Rng & Units 10/20/2022   12:01 PM 10/05/2022    9:49 AM 09/22/2022    8:51 AM  CMP  Glucose 70 - 99 mg/dL 119  147  829   BUN 8 - 23 mg/dL 13  13  21    Creatinine 0.44 - 1.00 mg/dL 5.62  1.30  8.65   Sodium 135 - 145 mmol/L 136  137  134   Potassium 3.5 - 5.1 mmol/L 3.8  3.9  3.9   Chloride 98 - 111 mmol/L 104  104  101   CO2 22 - 32 mmol/L 25  26  28    Calcium 8.9 - 10.3 mg/dL 9.0  9.2  9.1   Total Protein 6.5 - 8.1 g/dL 6.4  6.2  6.0   Total Bilirubin 0.3 - 1.2 mg/dL 0.8  0.4  0.9   Alkaline Phos 38 - 126 U/L 119  85  73   AST 15 - 41 U/L 15  15  12    ALT 0 - 44 U/L 14  9  10       No results found for: "CEA1", "CEA" / No results found for: "CEA1", "CEA" No results found for: "PSA1" No results found for: "HQI696" No results found for: "CAN125"  No results found for: "TOTALPROTELP", "ALBUMINELP", "A1GS", "A2GS", "BETS", "BETA2SER", "GAMS", "MSPIKE", "SPEI" Lab Results  Component Value Date   TIBC 238 (L) 08/17/2022   TIBC 149 (L) 10/08/2021   TIBC 159 (L) 04/10/2011   FERRITIN 296 08/17/2022   FERRITIN 675 (H) 10/08/2021   FERRITIN 1,680 (H) 04/10/2011   IRONPCTSAT 21 08/17/2022   IRONPCTSAT 9 (L) 10/08/2021   IRONPCTSAT 18 (L) 04/10/2011   No results found for: "LDH"   STUDIES:   No results found.

## 2022-10-26 ENCOUNTER — Inpatient Hospital Stay: Payer: Medicare PPO

## 2022-10-26 ENCOUNTER — Inpatient Hospital Stay: Payer: Medicare PPO | Admitting: Hematology

## 2022-10-26 DIAGNOSIS — C3490 Malignant neoplasm of unspecified part of unspecified bronchus or lung: Secondary | ICD-10-CM

## 2022-10-26 DIAGNOSIS — D649 Anemia, unspecified: Secondary | ICD-10-CM | POA: Diagnosis not present

## 2022-10-26 DIAGNOSIS — K3184 Gastroparesis: Secondary | ICD-10-CM | POA: Diagnosis not present

## 2022-10-26 DIAGNOSIS — Z5189 Encounter for other specified aftercare: Secondary | ICD-10-CM | POA: Diagnosis not present

## 2022-10-26 DIAGNOSIS — C3411 Malignant neoplasm of upper lobe, right bronchus or lung: Secondary | ICD-10-CM | POA: Diagnosis not present

## 2022-10-26 DIAGNOSIS — Z5111 Encounter for antineoplastic chemotherapy: Secondary | ICD-10-CM | POA: Diagnosis not present

## 2022-10-26 DIAGNOSIS — Z79899 Other long term (current) drug therapy: Secondary | ICD-10-CM | POA: Diagnosis not present

## 2022-10-26 DIAGNOSIS — T451X5A Adverse effect of antineoplastic and immunosuppressive drugs, initial encounter: Secondary | ICD-10-CM

## 2022-10-26 LAB — CBC WITH DIFFERENTIAL/PLATELET
Abs Immature Granulocytes: 0.12 10*3/uL — ABNORMAL HIGH (ref 0.00–0.07)
Basophils Absolute: 0.1 10*3/uL (ref 0.0–0.1)
Basophils Relative: 1 %
Eosinophils Absolute: 0 10*3/uL (ref 0.0–0.5)
Eosinophils Relative: 0 %
HCT: 35.6 % — ABNORMAL LOW (ref 36.0–46.0)
Hemoglobin: 11.5 g/dL — ABNORMAL LOW (ref 12.0–15.0)
Immature Granulocytes: 1 %
Lymphocytes Relative: 4 %
Lymphs Abs: 0.4 10*3/uL — ABNORMAL LOW (ref 0.7–4.0)
MCH: 30.7 pg (ref 26.0–34.0)
MCHC: 32.3 g/dL (ref 30.0–36.0)
MCV: 95.2 fL (ref 80.0–100.0)
Monocytes Absolute: 1.2 10*3/uL — ABNORMAL HIGH (ref 0.1–1.0)
Monocytes Relative: 11 %
Neutro Abs: 9.7 10*3/uL — ABNORMAL HIGH (ref 1.7–7.7)
Neutrophils Relative %: 83 %
Platelets: 79 10*3/uL — ABNORMAL LOW (ref 150–400)
RBC: 3.74 MIL/uL — ABNORMAL LOW (ref 3.87–5.11)
RDW: 18.3 % — ABNORMAL HIGH (ref 11.5–15.5)
WBC: 11.6 10*3/uL — ABNORMAL HIGH (ref 4.0–10.5)
nRBC: 0 % (ref 0.0–0.2)

## 2022-10-26 LAB — COMPREHENSIVE METABOLIC PANEL
ALT: 25 U/L (ref 0–44)
AST: 18 U/L (ref 15–41)
Albumin: 3.5 g/dL (ref 3.5–5.0)
Alkaline Phosphatase: 116 U/L (ref 38–126)
Anion gap: 7 (ref 5–15)
BUN: 17 mg/dL (ref 8–23)
CO2: 27 mmol/L (ref 22–32)
Calcium: 9.2 mg/dL (ref 8.9–10.3)
Chloride: 102 mmol/L (ref 98–111)
Creatinine, Ser: 0.86 mg/dL (ref 0.44–1.00)
GFR, Estimated: >60 mL/min (ref >60)
Glucose, Bld: 112 mg/dL — ABNORMAL HIGH (ref 70–99)
Potassium: 3.5 mmol/L (ref 3.5–5.1)
Sodium: 136 mmol/L (ref 135–145)
Total Bilirubin: 1.1 mg/dL (ref 0.3–1.2)
Total Protein: 6.5 g/dL (ref 6.5–8.1)

## 2022-10-26 LAB — MAGNESIUM: Magnesium: 1.8 mg/dL (ref 1.7–2.4)

## 2022-10-26 LAB — SAMPLE TO BLOOD BANK

## 2022-10-26 MED ORDER — HEPARIN SOD (PORK) LOCK FLUSH 100 UNIT/ML IV SOLN
500.0000 [IU] | Freq: Once | INTRAVENOUS | Status: AC
  Administered 2022-10-26: 500 [IU] via INTRAVENOUS

## 2022-10-26 MED ORDER — SODIUM CHLORIDE 0.9% FLUSH
10.0000 mL | Freq: Once | INTRAVENOUS | Status: AC
  Administered 2022-10-26: 10 mL via INTRAVENOUS

## 2022-10-26 NOTE — Patient Instructions (Addendum)
[facility] at [facility] Discharge Instructions   You were seen and examined today by Dr. Ellin Saba.  He reviewed the results of your lab work which are mostly normal/stable. Your platelets are low at 79,000.   We will hold your treatment today due to the low platelet count.   Return as scheduled.    Thank you for choosing [facility] at [facility] to provide your oncology and hematology care.  To afford each patient quality time with our provider, please arrive at least 15 minutes before your scheduled appointment time.   If you have a lab appointment with the Cancer Center please come in thru the Main Entrance and check in at the main information desk.  You need to re-schedule your appointment should you arrive 10 or more minutes late.  We strive to give you quality time with our providers, and arriving late affects you and other patients whose appointments are after yours.  Also, if you no show three or more times for appointments you may be dismissed from the clinic at the providers discretion.     Again, thank you for choosing [facility].  Our hope is that these requests will decrease the amount of time that you wait before being seen by our physicians.       _____________________________________________________________  Should you have questions after your visit to [facility], please contact our office at [phone] and follow the prompts.  Our office hours are 8:00 a.m. and 4:30 p.m. Monday - Friday.  Please note that voicemails left after 4:00 p.m. may not be returned until the following business day.  We are closed weekends and major holidays.  You do have access to a nurse 24-7, just call the main number to the clinic [phone] and do not press any options, hold on the line and a nurse will answer the phone.    For prescription refill requests, have your pharmacy contact our office and allow 72 hours.     Due to Covid, you will need to wear a mask upon entering the hospital. If you do not have a mask, a mask will be given to you at the Main Entrance upon arrival. For doctor visits, patients may have 1 support person age 66 or older with them. For treatment visits, patients can not have anyone with them due to social distancing guidelines and our immunocompromised population.

## 2022-10-26 NOTE — Progress Notes (Signed)
No treatment today per Katragadda. 

## 2022-10-27 ENCOUNTER — Inpatient Hospital Stay: Payer: Medicare PPO

## 2022-10-27 ENCOUNTER — Other Ambulatory Visit: Payer: Self-pay

## 2022-10-28 ENCOUNTER — Inpatient Hospital Stay: Payer: Medicare PPO

## 2022-10-30 ENCOUNTER — Inpatient Hospital Stay: Payer: Medicare PPO

## 2022-11-02 ENCOUNTER — Inpatient Hospital Stay: Payer: Medicare PPO

## 2022-11-02 ENCOUNTER — Inpatient Hospital Stay: Payer: Medicare PPO | Attending: Nurse Practitioner

## 2022-11-02 VITALS — BP 102/64 | HR 87 | Temp 97.9°F | Resp 16

## 2022-11-02 VITALS — BP 119/65 | HR 88 | Temp 97.5°F | Resp 18 | Ht 62.0 in | Wt 93.2 lb

## 2022-11-02 DIAGNOSIS — Z5111 Encounter for antineoplastic chemotherapy: Secondary | ICD-10-CM | POA: Diagnosis not present

## 2022-11-02 DIAGNOSIS — C3411 Malignant neoplasm of upper lobe, right bronchus or lung: Secondary | ICD-10-CM | POA: Insufficient documentation

## 2022-11-02 DIAGNOSIS — D6481 Anemia due to antineoplastic chemotherapy: Secondary | ICD-10-CM

## 2022-11-02 DIAGNOSIS — Z79899 Other long term (current) drug therapy: Secondary | ICD-10-CM | POA: Diagnosis not present

## 2022-11-02 DIAGNOSIS — C3490 Malignant neoplasm of unspecified part of unspecified bronchus or lung: Secondary | ICD-10-CM

## 2022-11-02 LAB — CBC WITH DIFFERENTIAL/PLATELET
Abs Immature Granulocytes: 0.03 10*3/uL (ref 0.00–0.07)
Basophils Absolute: 0.1 10*3/uL (ref 0.0–0.1)
Basophils Relative: 1 %
Eosinophils Absolute: 0 10*3/uL (ref 0.0–0.5)
Eosinophils Relative: 1 %
HCT: 35.9 % — ABNORMAL LOW (ref 36.0–46.0)
Hemoglobin: 11.7 g/dL — ABNORMAL LOW (ref 12.0–15.0)
Immature Granulocytes: 1 %
Lymphocytes Relative: 9 %
Lymphs Abs: 0.6 10*3/uL — ABNORMAL LOW (ref 0.7–4.0)
MCH: 31.3 pg (ref 26.0–34.0)
MCHC: 32.6 g/dL (ref 30.0–36.0)
MCV: 96 fL (ref 80.0–100.0)
Monocytes Absolute: 1.5 10*3/uL — ABNORMAL HIGH (ref 0.1–1.0)
Monocytes Relative: 26 %
Neutro Abs: 3.7 10*3/uL (ref 1.7–7.7)
Neutrophils Relative %: 62 %
Platelets: 109 10*3/uL — ABNORMAL LOW (ref 150–400)
RBC: 3.74 MIL/uL — ABNORMAL LOW (ref 3.87–5.11)
RDW: 18.3 % — ABNORMAL HIGH (ref 11.5–15.5)
WBC: 5.9 10*3/uL (ref 4.0–10.5)
nRBC: 0 % (ref 0.0–0.2)

## 2022-11-02 LAB — COMPREHENSIVE METABOLIC PANEL
ALT: 15 U/L (ref 0–44)
AST: 19 U/L (ref 15–41)
Albumin: 3.3 g/dL — ABNORMAL LOW (ref 3.5–5.0)
Alkaline Phosphatase: 78 U/L (ref 38–126)
Anion gap: 4 — ABNORMAL LOW (ref 5–15)
BUN: 14 mg/dL (ref 8–23)
CO2: 31 mmol/L (ref 22–32)
Calcium: 9.3 mg/dL (ref 8.9–10.3)
Chloride: 102 mmol/L (ref 98–111)
Creatinine, Ser: 0.76 mg/dL (ref 0.44–1.00)
GFR, Estimated: >60 mL/min (ref >60)
Glucose, Bld: 94 mg/dL (ref 70–99)
Potassium: 3.6 mmol/L (ref 3.5–5.1)
Sodium: 137 mmol/L (ref 135–145)
Total Bilirubin: 0.6 mg/dL (ref 0.3–1.2)
Total Protein: 6.2 g/dL — ABNORMAL LOW (ref 6.5–8.1)

## 2022-11-02 LAB — SAMPLE TO BLOOD BANK

## 2022-11-02 LAB — MAGNESIUM: Magnesium: 1.7 mg/dL (ref 1.7–2.4)

## 2022-11-02 MED ORDER — SODIUM CHLORIDE 0.9 % IV SOLN
80.0000 mg/m2 | Freq: Once | INTRAVENOUS | Status: AC
  Administered 2022-11-02: 112 mg via INTRAVENOUS
  Filled 2022-11-02: qty 5.6

## 2022-11-02 MED ORDER — SODIUM CHLORIDE 0.9% FLUSH
10.0000 mL | INTRAVENOUS | Status: DC | PRN
  Administered 2022-11-02: 10 mL via INTRAVENOUS

## 2022-11-02 MED ORDER — PALONOSETRON HCL INJECTION 0.25 MG/5ML
0.2500 mg | Freq: Once | INTRAVENOUS | Status: AC
  Administered 2022-11-02: 0.25 mg via INTRAVENOUS
  Filled 2022-11-02: qty 5

## 2022-11-02 MED ORDER — SODIUM CHLORIDE 0.9 % IV SOLN: Freq: Once | INTRAVENOUS | Status: AC

## 2022-11-02 MED ORDER — SODIUM CHLORIDE 0.9% FLUSH
10.0000 mL | INTRAVENOUS | Status: DC | PRN
  Administered 2022-11-02: 10 mL

## 2022-11-02 MED ORDER — SODIUM CHLORIDE 0.9 % IV SOLN
150.0000 mg | Freq: Once | INTRAVENOUS | Status: AC
  Administered 2022-11-02: 150 mg via INTRAVENOUS
  Filled 2022-11-02: qty 150

## 2022-11-02 MED ORDER — SODIUM CHLORIDE 0.9 % IV SOLN
200.0000 mg | Freq: Once | INTRAVENOUS | Status: AC
  Administered 2022-11-02: 200 mg via INTRAVENOUS
  Filled 2022-11-02: qty 20

## 2022-11-02 MED ORDER — SODIUM CHLORIDE 0.9 % IV SOLN
10.0000 mg | Freq: Once | INTRAVENOUS | Status: AC
  Administered 2022-11-02: 10 mg via INTRAVENOUS
  Filled 2022-11-02: qty 10

## 2022-11-02 MED ORDER — CETIRIZINE HCL 10 MG/ML IV SOLN
10.0000 mg | Freq: Once | INTRAVENOUS | Status: AC
  Administered 2022-11-02: 10 mg via INTRAVENOUS
  Filled 2022-11-02: qty 1

## 2022-11-02 MED ORDER — FAMOTIDINE IN NACL 20-0.9 MG/50ML-% IV SOLN
20.0000 mg | Freq: Once | INTRAVENOUS | Status: AC
  Administered 2022-11-02: 20 mg via INTRAVENOUS
  Filled 2022-11-02: qty 50

## 2022-11-02 MED ORDER — SODIUM CHLORIDE 0.9 % IV SOLN
1500.0000 mg | Freq: Once | INTRAVENOUS | Status: AC
  Administered 2022-11-02: 1500 mg via INTRAVENOUS
  Filled 2022-11-02: qty 30

## 2022-11-02 MED ORDER — HEPARIN SOD (PORK) LOCK FLUSH 100 UNIT/ML IV SOLN
500.0000 [IU] | Freq: Once | INTRAVENOUS | Status: AC | PRN
  Administered 2022-11-02: 500 [IU]

## 2022-11-02 MED FILL — Etoposide Inj 1 GM/50ML (20 MG/ML): INTRAVENOUS | Qty: 5.6 | Status: AC

## 2022-11-02 NOTE — Progress Notes (Signed)
Patient presents today for chemotherapy/immunotherapy infusion of  Imfinzi, Carboplatin, and Etoposide.  Patient is in satisfactory condition. Patient reports new rash to chest/sternum, left arm, and left thight x2 days. Patient denies any fevers, itching, or pain. Dr Ellin Saba made aware. Vital signs are stable.  Labs reviewed and all labs are within treatment parameters.  We will proceed with treatment per MD orders.    Patient tolerated treatment well with no complaints voiced.  Patient left ambulatory in stable condition.  Vital signs stable at discharge.  Follow up as scheduled.

## 2022-11-02 NOTE — Patient Instructions (Signed)
[facility]  Discharge Instructions: Thank you for choosing [facility] to provide your oncology and hematology care.  If you have a lab appointment with the Cancer Center - please note that after April 8th, 2024, all labs will be drawn in the cancer center.  You do not have to check in or register with the main entrance as you have in the past but will complete your check-in in the cancer center.  Wear comfortable clothing and clothing appropriate for easy access to any Portacath or PICC line.   We strive to give you quality time with your provider. You may need to reschedule your appointment if you arrive late (15 or more minutes).  Arriving late affects you and other patients whose appointments are after yours.  Also, if you miss three or more appointments without notifying the office, you may be dismissed from the clinic at the provider's discretion.      For prescription refill requests, have your pharmacy contact our office and allow 72 hours for refills to be completed.    Today you received the following chemotherapy and/or immunotherapy agents Imfinzi, Carboplatin, and Etoposide.  Etoposide Injection What is this medication? ETOPOSIDE (e toe POE side) treats some types of cancer. It works by slowing down the growth of cancer cells. This medicine may be used for other purposes; ask your health care provider or pharmacist if you have questions. COMMON BRAND NAME(S): Etopophos, Toposar, VePesid What should I tell my care team before I take this medication? They need to know if you have any of these conditions: Infection Kidney disease Liver disease Low blood counts, such as low white cell, platelet, red cell counts An unusual or allergic reaction to etoposide, other medications, foods, dyes, or preservatives If you or your partner are pregnant or trying to get pregnant Breastfeeding How should I use this medication? This medication is injected into  a vein. It is given by your care team in a hospital or clinic setting. Talk to your care team about the use of this medication in children. Special care may be needed. Overdosage: If you think you have taken too much of this medicine contact a poison control center or emergency room at once. NOTE: This medicine is only for you. Do not share this medicine with others. What if I miss a dose? Keep appointments for follow-up doses. It is important not to miss your dose. Call your care team if you are unable to keep an appointment. What may interact with this medication? Warfarin This list may not describe all possible interactions. Give your health care provider a list of all the medicines, herbs, non-prescription drugs, or dietary supplements you use. Also tell them if you smoke, drink alcohol, or use illegal drugs. Some items may interact with your medicine. What should I watch for while using this medication? Your condition will be monitored carefully while you are receiving this medication. This medication may make you feel generally unwell. This is not uncommon as chemotherapy can affect healthy cells as well as cancer cells. Report any side effects. Continue your course of treatment even though you feel ill unless your care team tells you to stop. This medication can cause serious side effects. To reduce the risk, your care team may give you other medications to take before receiving this one. Be sure to follow the directions from your care team. This medication may increase your risk of getting an infection. Call your care team for advice  if you get a fever, chills, sore throat, or other symptoms of a cold or flu. Do not treat yourself. Try to avoid being around people who are sick. This medication may increase your risk to bruise or bleed. Call your care team if you notice any unusual bleeding. Talk to your care team about your risk of cancer. You may be more at risk for certain types of cancers  if you take this medication. Talk to your care team if you may be pregnant. Serious birth defects can occur if you take this medication during pregnancy and for 6 months after the last dose. You will need a negative pregnancy test before starting this medication. Contraception is recommended while taking this medication and for 6 months after the last dose. Your care team can help you find the option that works for you. If your partner can get pregnant, use a condom during sex while taking this medication and for 4 months after the last dose. Do not breastfeed while taking this medication. This medication may cause infertility. Talk to your care team if you are concerned about your fertility. What side effects may I notice from receiving this medication? Side effects that you should report to your care team as soon as possible: Allergic reactions--skin rash, itching, hives, swelling of the face, lips, tongue, or throat Infection--fever, chills, cough, sore throat, wounds that don't heal, pain or trouble when passing urine, general feeling of discomfort or being unwell Low red blood cell level--unusual weakness or fatigue, dizziness, headache, trouble breathing Unusual bruising or bleeding Side effects that usually do not require medical attention (report to your care team if they continue or are bothersome): Diarrhea Fatigue Hair loss Loss of appetite Nausea Vomiting This list may not describe all possible side effects. Call your doctor for medical advice about side effects. You may report side effects to FDA at 1-800-FDA-1088. Where should I keep my medication? This medication is given in a hospital or clinic. It will not be stored at home. NOTE: This sheet is a summary. It may not cover all possible information. If you have questions about this medicine, talk to your doctor, pharmacist, or health care provider.  2024 Elsevier/Gold Standard (2021-08-07 00:00:00)   Carboplatin  Injection What is this medication? CARBOPLATIN (KAR boe pla tin) treats some types of cancer. It works by slowing down the growth of cancer cells. This medicine may be used for other purposes; ask your health care provider or pharmacist if you have questions. COMMON BRAND NAME(S): Paraplatin What should I tell my care team before I take this medication? They need to know if you have any of these conditions: Blood disorders Hearing problems Kidney disease Recent or ongoing radiation therapy An unusual or allergic reaction to carboplatin, cisplatin, other medications, foods, dyes, or preservatives Pregnant or trying to get pregnant Breast-feeding How should I use this medication? This medication is injected into a vein. It is given by your care team in a hospital or clinic setting. Talk to your care team about the use of this medication in children. Special care may be needed. Overdosage: If you think you have taken too much of this medicine contact a poison control center or emergency room at once. NOTE: This medicine is only for you. Do not share this medicine with others. What if I miss a dose? Keep appointments for follow-up doses. It is important not to miss your dose. Call your care team if you are unable to keep an appointment. What may  interact with this medication? Medications for seizures Some antibiotics, such as amikacin, gentamicin, neomycin, streptomycin, tobramycin Vaccines This list may not describe all possible interactions. Give your health care provider a list of all the medicines, herbs, non-prescription drugs, or dietary supplements you use. Also tell them if you smoke, drink alcohol, or use illegal drugs. Some items may interact with your medicine. What should I watch for while using this medication? Your condition will be monitored carefully while you are receiving this medication. You may need blood work while taking this medication. This medication may make you feel  generally unwell. This is not uncommon, as chemotherapy can affect healthy cells as well as cancer cells. Report any side effects. Continue your course of treatment even though you feel ill unless your care team tells you to stop. In some cases, you may be given additional medications to help with side effects. Follow all directions for their use. This medication may increase your risk of getting an infection. Call your care team for advice if you get a fever, chills, sore throat, or other symptoms of a cold or flu. Do not treat yourself. Try to avoid being around people who are sick. Avoid taking medications that contain aspirin, acetaminophen, ibuprofen, naproxen, or ketoprofen unless instructed by your care team. These medications may hide a fever. Be careful brushing or flossing your teeth or using a toothpick because you may get an infection or bleed more easily. If you have any dental work done, tell your dentist you are receiving this medication. Talk to your care team if you wish to become pregnant or think you might be pregnant. This medication can cause serious birth defects. Talk to your care team about effective forms of contraception. Do not breast-feed while taking this medication. What side effects may I notice from receiving this medication? Side effects that you should report to your care team as soon as possible: Allergic reactions--skin rash, itching, hives, swelling of the face, lips, tongue, or throat Infection--fever, chills, cough, sore throat, wounds that don't heal, pain or trouble when passing urine, general feeling of discomfort or being unwell Low red blood cell level--unusual weakness or fatigue, dizziness, headache, trouble breathing Pain, tingling, or numbness in the hands or feet, muscle weakness, change in vision, confusion or trouble speaking, loss of balance or coordination, trouble walking, seizures Unusual bruising or bleeding Side effects that usually do not  require medical attention (report to your care team if they continue or are bothersome): Hair loss Nausea Unusual weakness or fatigue Vomiting This list may not describe all possible side effects. Call your doctor for medical advice about side effects. You may report side effects to FDA at 1-800-FDA-1088. Where should I keep my medication? This medication is given in a hospital or clinic. It will not be stored at home. NOTE: This sheet is a summary. It may not cover all possible information. If you have questions about this medicine, talk to your doctor, pharmacist, or health care provider.  2024 Elsevier/Gold Standard (2021-07-08 00:00:00)   Durvalumab Injection What is this medication? DURVALUMAB (dur VAL ue mab) treats some types of cancer. It works by helping your immune system slow or stop the spread of cancer cells. It is a monoclonal antibody. This medicine may be used for other purposes; ask your health care provider or pharmacist if you have questions. COMMON BRAND NAME(S): IMFINZI What should I tell my care team before I take this medication? They need to know if you have any of  these conditions: Allogeneic stem cell transplant (uses someone else's stem cells) Autoimmune diseases, such as Crohn disease, ulcerative colitis, lupus History of chest radiation Nervous system problems, such as Guillain-Barre syndrome, myasthenia gravis Organ transplant An unusual or allergic reaction to durvalumab, other medications, foods, dyes, or preservatives Pregnant or trying to get pregnant Breast-feeding How should I use this medication? This medication is infused into a vein. It is given by your care team in a hospital or clinic setting. A special MedGuide will be given to you before each treatment. Be sure to read this information carefully each time. Talk to your care team about the use of this medication in children. Special care may be needed. Overdosage: If you think you have taken  too much of this medicine contact a poison control center or emergency room at once. NOTE: This medicine is only for you. Do not share this medicine with others. What if I miss a dose? Keep appointments for follow-up doses. It is important not to miss your dose. Call your care team if you are unable to keep an appointment. What may interact with this medication? Interactions have not been studied. This list may not describe all possible interactions. Give your health care provider a list of all the medicines, herbs, non-prescription drugs, or dietary supplements you use. Also tell them if you smoke, drink alcohol, or use illegal drugs. Some items may interact with your medicine. What should I watch for while using this medication? Your condition will be monitored carefully while you are receiving this medication. You may need blood work while taking this medication. This medication may cause serious skin reactions. They can happen weeks to months after starting the medication. Contact your care team right away if you notice fevers or flu-like symptoms with a rash. The rash may be red or purple and then turn into blisters or peeling of the skin. You may also notice a red rash with swelling of the face, lips, or lymph nodes in your neck or under your arms. Tell your care team right away if you have any change in your eyesight. Talk to your care team if you may be pregnant. Serious birth defects can occur if you take this medication during pregnancy and for 3 months after the last dose. You will need a negative pregnancy test before starting this medication. Contraception is recommended while taking this medication and for 3 months after the last dose. Your care team can help you find the option that works for you. Do not breastfeed while taking this medication and for 3 months after the last dose. What side effects may I notice from receiving this medication? Side effects that you should report to your  care team as soon as possible: Allergic reactions--skin rash, itching, hives, swelling of the face, lips, tongue, or throat Dry cough, shortness of breath or trouble breathing Eye pain, redness, irritation, or discharge with blurry or decreased vision Heart muscle inflammation--unusual weakness or fatigue, shortness of breath, chest pain, fast or irregular heartbeat, dizziness, swelling of the ankles, feet, or hands Hormone gland problems--headache, sensitivity to light, unusual weakness or fatigue, dizziness, fast or irregular heartbeat, increased sensitivity to cold or heat, excessive sweating, constipation, hair loss, increased thirst or amount of urine, tremors or shaking, irritability Infusion reactions--chest pain, shortness of breath or trouble breathing, feeling faint or lightheaded Kidney injury (glomerulonephritis)--decrease in the amount of urine, red or dark brown urine, foamy or bubbly urine, swelling of the ankles, hands, or feet Liver injury--right  upper belly pain, loss of appetite, nausea, light-colored stool, dark yellow or brown urine, yellowing skin or eyes, unusual weakness or fatigue Pain, tingling, or numbness in the hands or feet, muscle weakness, change in vision, confusion or trouble speaking, loss of balance or coordination, trouble walking, seizures Rash, fever, and swollen lymph nodes Redness, blistering, peeling, or loosening of the skin, including inside the mouth Sudden or severe stomach pain, bloody diarrhea, fever, nausea, vomiting Side effects that usually do not require medical attention (report these to your care team if they continue or are bothersome): Bone, joint, or muscle pain Diarrhea Fatigue Loss of appetite Nausea Skin rash This list may not describe all possible side effects. Call your doctor for medical advice about side effects. You may report side effects to FDA at 1-800-FDA-1088. Where should I keep my medication? This medication is given in a  hospital or clinic. It will not be stored at home. NOTE: This sheet is a summary. It may not cover all possible information. If you have questions about this medicine, talk to your doctor, pharmacist, or health care provider.  2024 Elsevier/Gold Standard (2021-07-29 00:00:00)    To help prevent nausea and vomiting after your treatment, we encourage you to take your nausea medication as directed.  BELOW ARE SYMPTOMS THAT SHOULD BE REPORTED IMMEDIATELY: *FEVER GREATER THAN 100.4 F (38 C) OR HIGHER *CHILLS OR SWEATING *NAUSEA AND VOMITING THAT IS NOT CONTROLLED WITH YOUR NAUSEA MEDICATION *UNUSUAL SHORTNESS OF BREATH *UNUSUAL BRUISING OR BLEEDING *URINARY PROBLEMS (pain or burning when urinating, or frequent urination) *BOWEL PROBLEMS (unusual diarrhea, constipation, pain near the anus) TENDERNESS IN MOUTH AND THROAT WITH OR WITHOUT PRESENCE OF ULCERS (sore throat, sores in mouth, or a toothache) UNUSUAL RASH, SWELLING OR PAIN  UNUSUAL VAGINAL DISCHARGE OR ITCHING   Items with * indicate a potential emergency and should be followed up as soon as possible or go to the Emergency Department if any problems should occur.  Please show the CHEMOTHERAPY ALERT CARD or IMMUNOTHERAPY ALERT CARD at check-in to the Emergency Department and triage nurse.  Should you have questions after your visit or need to cancel or reschedule your appointment, please contact [facility] [phone]  and follow the prompts.  Office hours are 8:00 a.m. to 4:30 p.m. Monday - Friday. Please note that voicemails left after 4:00 p.m. may not be returned until the following business day.  We are closed weekends and major holidays. You have access to a nurse at all times for urgent questions. Please call the main number to the clinic [phone] and follow the prompts.  For any non-urgent questions, you may also contact your provider using MyChart. We now offer e-Visits for anyone 47 and older to  request care online for non-urgent symptoms. For details visit [email or web address].   Also download the MyChart app! Go to the app store, search "MyChart", open the app, select [place], and log in with your MyChart username and password.

## 2022-11-03 ENCOUNTER — Inpatient Hospital Stay: Payer: Medicare PPO

## 2022-11-03 VITALS — BP 104/58 | HR 87 | Temp 97.2°F | Resp 18

## 2022-11-03 DIAGNOSIS — Z5111 Encounter for antineoplastic chemotherapy: Secondary | ICD-10-CM | POA: Diagnosis not present

## 2022-11-03 DIAGNOSIS — C3490 Malignant neoplasm of unspecified part of unspecified bronchus or lung: Secondary | ICD-10-CM

## 2022-11-03 DIAGNOSIS — C3411 Malignant neoplasm of upper lobe, right bronchus or lung: Secondary | ICD-10-CM | POA: Diagnosis not present

## 2022-11-03 DIAGNOSIS — Z79899 Other long term (current) drug therapy: Secondary | ICD-10-CM | POA: Diagnosis not present

## 2022-11-03 MED ORDER — SODIUM CHLORIDE 0.9 % IV SOLN: Freq: Once | INTRAVENOUS | Status: AC

## 2022-11-03 MED ORDER — SODIUM CHLORIDE 0.9 % IV SOLN
10.0000 mg | Freq: Once | INTRAVENOUS | Status: AC
  Administered 2022-11-03: 10 mg via INTRAVENOUS
  Filled 2022-11-03: qty 10

## 2022-11-03 MED ORDER — SODIUM CHLORIDE 0.9% FLUSH
10.0000 mL | INTRAVENOUS | Status: DC | PRN
  Administered 2022-11-03: 10 mL

## 2022-11-03 MED ORDER — SODIUM CHLORIDE 0.9 % IV SOLN
80.0000 mg/m2 | Freq: Once | INTRAVENOUS | Status: AC
  Administered 2022-11-03: 112 mg via INTRAVENOUS
  Filled 2022-11-03: qty 5.6

## 2022-11-03 MED ORDER — HEPARIN SOD (PORK) LOCK FLUSH 100 UNIT/ML IV SOLN
500.0000 [IU] | Freq: Once | INTRAVENOUS | Status: AC | PRN
  Administered 2022-11-03: 500 [IU]

## 2022-11-03 NOTE — Patient Instructions (Signed)
[facility]  Discharge Instructions: Thank you for choosing [facility] to provide your oncology and hematology care.  If you have a lab appointment with the Cancer Center - please note that after April 8th, 2024, all labs will be drawn in the cancer center.  You do not have to check in or register with the main entrance as you have in the past but will complete your check-in in the cancer center.  Wear comfortable clothing and clothing appropriate for easy access to any Portacath or PICC line.   We strive to give you quality time with your provider. You may need to reschedule your appointment if you arrive late (15 or more minutes).  Arriving late affects you and other patients whose appointments are after yours.  Also, if you miss three or more appointments without notifying the office, you may be dismissed from the clinic at the provider's discretion.      For prescription refill requests, have your pharmacy contact our office and allow 72 hours for refills to be completed.    Today you received the following chemotherapy and/or immunotherapy agents D2 Etoposide   To help prevent nausea and vomiting after your treatment, we encourage you to take your nausea medication as directed.   Etoposide Injection What is this medication? ETOPOSIDE (e toe POE side) treats some types of cancer. It works by slowing down the growth of cancer cells. This medicine may be used for other purposes; ask your health care provider or pharmacist if you have questions. COMMON BRAND NAME(S): Etopophos, Toposar, VePesid What should I tell my care team before I take this medication? They need to know if you have any of these conditions: Infection Kidney disease Liver disease Low blood counts, such as low white cell, platelet, red cell counts An unusual or allergic reaction to etoposide, other medications, foods, dyes, or preservatives If you or your partner are pregnant or  trying to get pregnant Breastfeeding How should I use this medication? This medication is injected into a vein. It is given by your care team in a hospital or clinic setting. Talk to your care team about the use of this medication in children. Special care may be needed. Overdosage: If you think you have taken too much of this medicine contact a poison control center or emergency room at once. NOTE: This medicine is only for you. Do not share this medicine with others. What if I miss a dose? Keep appointments for follow-up doses. It is important not to miss your dose. Call your care team if you are unable to keep an appointment. What may interact with this medication? Warfarin This list may not describe all possible interactions. Give your health care provider a list of all the medicines, herbs, non-prescription drugs, or dietary supplements you use. Also tell them if you smoke, drink alcohol, or use illegal drugs. Some items may interact with your medicine. What should I watch for while using this medication? Your condition will be monitored carefully while you are receiving this medication. This medication may make you feel generally unwell. This is not uncommon as chemotherapy can affect healthy cells as well as cancer cells. Report any side effects. Continue your course of treatment even though you feel ill unless your care team tells you to stop. This medication can cause serious side effects. To reduce the risk, your care team may give you other medications to take before receiving this one. Be sure to follow the directions  from your care team. This medication may increase your risk of getting an infection. Call your care team for advice if you get a fever, chills, sore throat, or other symptoms of a cold or flu. Do not treat yourself. Try to avoid being around people who are sick. This medication may increase your risk to bruise or bleed. Call your care team if you notice any unusual  bleeding. Talk to your care team about your risk of cancer. You may be more at risk for certain types of cancers if you take this medication. Talk to your care team if you may be pregnant. Serious birth defects can occur if you take this medication during pregnancy and for 6 months after the last dose. You will need a negative pregnancy test before starting this medication. Contraception is recommended while taking this medication and for 6 months after the last dose. Your care team can help you find the option that works for you. If your partner can get pregnant, use a condom during sex while taking this medication and for 4 months after the last dose. Do not breastfeed while taking this medication. This medication may cause infertility. Talk to your care team if you are concerned about your fertility. What side effects may I notice from receiving this medication? Side effects that you should report to your care team as soon as possible: Allergic reactions--skin rash, itching, hives, swelling of the face, lips, tongue, or throat Infection--fever, chills, cough, sore throat, wounds that don't heal, pain or trouble when passing urine, general feeling of discomfort or being unwell Low red blood cell level--unusual weakness or fatigue, dizziness, headache, trouble breathing Unusual bruising or bleeding Side effects that usually do not require medical attention (report to your care team if they continue or are bothersome): Diarrhea Fatigue Hair loss Loss of appetite Nausea Vomiting This list may not describe all possible side effects. Call your doctor for medical advice about side effects. You may report side effects to FDA at 1-800-FDA-1088. Where should I keep my medication? This medication is given in a hospital or clinic. It will not be stored at home. NOTE: This sheet is a summary. It may not cover all possible information. If you have questions about this medicine, talk to your doctor,  pharmacist, or health care provider.  2024 Elsevier/Gold Standard (2021-08-07 00:00:00)  BELOW ARE SYMPTOMS THAT SHOULD BE REPORTED IMMEDIATELY: *FEVER GREATER THAN 100.4 F (38 C) OR HIGHER *CHILLS OR SWEATING *NAUSEA AND VOMITING THAT IS NOT CONTROLLED WITH YOUR NAUSEA MEDICATION *UNUSUAL SHORTNESS OF BREATH *UNUSUAL BRUISING OR BLEEDING *URINARY PROBLEMS (pain or burning when urinating, or frequent urination) *BOWEL PROBLEMS (unusual diarrhea, constipation, pain near the anus) TENDERNESS IN MOUTH AND THROAT WITH OR WITHOUT PRESENCE OF ULCERS (sore throat, sores in mouth, or a toothache) UNUSUAL RASH, SWELLING OR PAIN  UNUSUAL VAGINAL DISCHARGE OR ITCHING   Items with * indicate a potential emergency and should be followed up as soon as possible or go to the Emergency Department if any problems should occur.  Please show the CHEMOTHERAPY ALERT CARD or IMMUNOTHERAPY ALERT CARD at check-in to the Emergency Department and triage nurse.  Should you have questions after your visit or need to cancel or reschedule your appointment, please contact [facility] [phone]  and follow the prompts.  Office hours are 8:00 a.m. to 4:30 p.m. Monday - Friday. Please note that voicemails left after 4:00 p.m. may not be returned until the following business day.  We are  closed weekends and major holidays. You have access to a nurse at all times for urgent questions. Please call the main number to the clinic [phone] and follow the prompts.  For any non-urgent questions, you may also contact your provider using MyChart. We now offer e-Visits for anyone 40 and older to request care online for non-urgent symptoms. For details visit [email or web address].   Also download the MyChart app! Go to the app store, search "MyChart", open the app, select [place], and log in with your MyChart username and password.

## 2022-11-03 NOTE — Progress Notes (Signed)
Patient presents today for D2 Etoposide infusion. Patient is in satisfactory condition with no new complaints voiced.  Vital signs are stable.  Labs reviewed from 11/03/22 and all labs are within treatment parameters.  We will proceed with treatment per MD orders.    D2 Etoposide given today per MD orders. Tolerated infusion without adverse affects. Vital signs stable. No complaints at this time. Discharged from clinic ambulatory in stable condition. Alert and oriented x 3. F/U with [facility] as scheduled.

## 2022-11-04 ENCOUNTER — Inpatient Hospital Stay: Payer: Medicare PPO

## 2022-11-04 VITALS — BP 132/78 | HR 78 | Temp 98.2°F | Resp 18

## 2022-11-04 DIAGNOSIS — C3490 Malignant neoplasm of unspecified part of unspecified bronchus or lung: Secondary | ICD-10-CM

## 2022-11-04 DIAGNOSIS — C3411 Malignant neoplasm of upper lobe, right bronchus or lung: Secondary | ICD-10-CM | POA: Diagnosis not present

## 2022-11-04 DIAGNOSIS — Z5111 Encounter for antineoplastic chemotherapy: Secondary | ICD-10-CM | POA: Diagnosis not present

## 2022-11-04 DIAGNOSIS — Z79899 Other long term (current) drug therapy: Secondary | ICD-10-CM | POA: Diagnosis not present

## 2022-11-04 MED ORDER — SODIUM CHLORIDE 0.9 % IV SOLN: Freq: Once | INTRAVENOUS | Status: AC

## 2022-11-04 MED ORDER — SODIUM CHLORIDE 0.9 % IV SOLN
80.0000 mg/m2 | Freq: Once | INTRAVENOUS | Status: AC
  Administered 2022-11-04: 112 mg via INTRAVENOUS
  Filled 2022-11-04: qty 5.6

## 2022-11-04 MED ORDER — SODIUM CHLORIDE 0.9 % IV SOLN
10.0000 mg | Freq: Once | INTRAVENOUS | Status: AC
  Administered 2022-11-04: 10 mg via INTRAVENOUS
  Filled 2022-11-04: qty 10

## 2022-11-04 NOTE — Patient Instructions (Signed)
[facility]  Discharge Instructions: Thank you for choosing [facility] to provide your oncology and hematology care.  If you have a lab appointment with the Cancer Center - please note that after April 8th, 2024, all labs will be drawn in the cancer center.  You do not have to check in or register with the main entrance as you have in the past but will complete your check-in in the cancer center.  Wear comfortable clothing and clothing appropriate for easy access to any Portacath or PICC line.   We strive to give you quality time with your provider. You may need to reschedule your appointment if you arrive late (15 or more minutes).  Arriving late affects you and other patients whose appointments are after yours.  Also, if you miss three or more appointments without notifying the office, you may be dismissed from the clinic at the provider's discretion.      For prescription refill requests, have your pharmacy contact our office and allow 72 hours for refills to be completed.    Today you received the following chemotherapy and/or immunotherapy agents D2 Etoposide   To help prevent nausea and vomiting after your treatment, we encourage you to take your nausea medication as directed.   Etoposide Injection What is this medication? ETOPOSIDE (e toe POE side) treats some types of cancer. It works by slowing down the growth of cancer cells. This medicine may be used for other purposes; ask your health care provider or pharmacist if you have questions. COMMON BRAND NAME(S): Etopophos, Toposar, VePesid What should I tell my care team before I take this medication? They need to know if you have any of these conditions: Infection Kidney disease Liver disease Low blood counts, such as low white cell, platelet, red cell counts An unusual or allergic reaction to etoposide, other medications, foods, dyes, or preservatives If you or your partner are pregnant or  trying to get pregnant Breastfeeding How should I use this medication? This medication is injected into a vein. It is given by your care team in a hospital or clinic setting. Talk to your care team about the use of this medication in children. Special care may be needed. Overdosage: If you think you have taken too much of this medicine contact a poison control center or emergency room at once. NOTE: This medicine is only for you. Do not share this medicine with others. What if I miss a dose? Keep appointments for follow-up doses. It is important not to miss your dose. Call your care team if you are unable to keep an appointment. What may interact with this medication? Warfarin This list may not describe all possible interactions. Give your health care provider a list of all the medicines, herbs, non-prescription drugs, or dietary supplements you use. Also tell them if you smoke, drink alcohol, or use illegal drugs. Some items may interact with your medicine. What should I watch for while using this medication? Your condition will be monitored carefully while you are receiving this medication. This medication may make you feel generally unwell. This is not uncommon as chemotherapy can affect healthy cells as well as cancer cells. Report any side effects. Continue your course of treatment even though you feel ill unless your care team tells you to stop. This medication can cause serious side effects. To reduce the risk, your care team may give you other medications to take before receiving this one. Be sure to follow the directions  from your care team. This medication may increase your risk of getting an infection. Call your care team for advice if you get a fever, chills, sore throat, or other symptoms of a cold or flu. Do not treat yourself. Try to avoid being around people who are sick. This medication may increase your risk to bruise or bleed. Call your care team if you notice any unusual  bleeding. Talk to your care team about your risk of cancer. You may be more at risk for certain types of cancers if you take this medication. Talk to your care team if you may be pregnant. Serious birth defects can occur if you take this medication during pregnancy and for 6 months after the last dose. You will need a negative pregnancy test before starting this medication. Contraception is recommended while taking this medication and for 6 months after the last dose. Your care team can help you find the option that works for you. If your partner can get pregnant, use a condom during sex while taking this medication and for 4 months after the last dose. Do not breastfeed while taking this medication. This medication may cause infertility. Talk to your care team if you are concerned about your fertility. What side effects may I notice from receiving this medication? Side effects that you should report to your care team as soon as possible: Allergic reactions--skin rash, itching, hives, swelling of the face, lips, tongue, or throat Infection--fever, chills, cough, sore throat, wounds that don't heal, pain or trouble when passing urine, general feeling of discomfort or being unwell Low red blood cell level--unusual weakness or fatigue, dizziness, headache, trouble breathing Unusual bruising or bleeding Side effects that usually do not require medical attention (report to your care team if they continue or are bothersome): Diarrhea Fatigue Hair loss Loss of appetite Nausea Vomiting This list may not describe all possible side effects. Call your doctor for medical advice about side effects. You may report side effects to FDA at 1-800-FDA-1088. Where should I keep my medication? This medication is given in a hospital or clinic. It will not be stored at home. NOTE: This sheet is a summary. It may not cover all possible information. If you have questions about this medicine, talk to your doctor,  pharmacist, or health care provider.  2024 Elsevier/Gold Standard (2021-08-07 00:00:00)  BELOW ARE SYMPTOMS THAT SHOULD BE REPORTED IMMEDIATELY: *FEVER GREATER THAN 100.4 F (38 C) OR HIGHER *CHILLS OR SWEATING *NAUSEA AND VOMITING THAT IS NOT CONTROLLED WITH YOUR NAUSEA MEDICATION *UNUSUAL SHORTNESS OF BREATH *UNUSUAL BRUISING OR BLEEDING *URINARY PROBLEMS (pain or burning when urinating, or frequent urination) *BOWEL PROBLEMS (unusual diarrhea, constipation, pain near the anus) TENDERNESS IN MOUTH AND THROAT WITH OR WITHOUT PRESENCE OF ULCERS (sore throat, sores in mouth, or a toothache) UNUSUAL RASH, SWELLING OR PAIN  UNUSUAL VAGINAL DISCHARGE OR ITCHING   Items with * indicate a potential emergency and should be followed up as soon as possible or go to the Emergency Department if any problems should occur.  Please show the CHEMOTHERAPY ALERT CARD or IMMUNOTHERAPY ALERT CARD at check-in to the Emergency Department and triage nurse.  Should you have questions after your visit or need to cancel or reschedule your appointment, please contact [facility] [phone]  and follow the prompts.  Office hours are 8:00 a.m. to 4:30 p.m. Monday - Friday. Please note that voicemails left after 4:00 p.m. may not be returned until the following business day.  We are  closed weekends and major holidays. You have access to a nurse at all times for urgent questions. Please call the main number to the clinic [phone] and follow the prompts.  For any non-urgent questions, you may also contact your provider using MyChart. We now offer e-Visits for anyone 40 and older to request care online for non-urgent symptoms. For details visit [email or web address].   Also download the MyChart app! Go to the app store, search "MyChart", open the app, select [place], and log in with your MyChart username and password.

## 2022-11-06 ENCOUNTER — Inpatient Hospital Stay: Payer: Medicare PPO

## 2022-11-06 VITALS — BP 116/56 | HR 87 | Temp 97.3°F | Resp 18

## 2022-11-06 DIAGNOSIS — Z5111 Encounter for antineoplastic chemotherapy: Secondary | ICD-10-CM | POA: Diagnosis not present

## 2022-11-06 DIAGNOSIS — C3411 Malignant neoplasm of upper lobe, right bronchus or lung: Secondary | ICD-10-CM | POA: Diagnosis not present

## 2022-11-06 DIAGNOSIS — C3490 Malignant neoplasm of unspecified part of unspecified bronchus or lung: Secondary | ICD-10-CM

## 2022-11-06 DIAGNOSIS — Z79899 Other long term (current) drug therapy: Secondary | ICD-10-CM | POA: Diagnosis not present

## 2022-11-06 MED ORDER — PEGFILGRASTIM-CBQV 6 MG/0.6ML ~~LOC~~ SOSY
6.0000 mg | PREFILLED_SYRINGE | Freq: Once | SUBCUTANEOUS | Status: AC
  Administered 2022-11-06: 6 mg via SUBCUTANEOUS
  Filled 2022-11-06: qty 0.6

## 2022-11-06 NOTE — Patient Instructions (Signed)
[facility] [place]  Discharge Instructions: Thank you for choosing [facility] to provide your oncology and hematology care.  If you have a lab appointment with the Cancer Center - please note that after April 8th, 2024, all labs will be drawn in the cancer center.  You do not have to check in or register with the main entrance as you have in the past but will complete your check-in in the cancer center.  Wear comfortable clothing and clothing appropriate for easy access to any Portacath or PICC line.   We strive to give you quality time with your provider. You may need to reschedule your appointment if you arrive late (15 or more minutes).  Arriving late affects you and other patients whose appointments are after yours.  Also, if you miss three or more appointments without notifying the office, you may be dismissed from the clinic at the provider's discretion.      For prescription refill requests, have your pharmacy contact our office and allow 72 hours for refills to be completed.    Today you received the following chemotherapy and/or immunotherapy agents Udenyca.  Pegfilgrastim Injection What is this medication? PEGFILGRASTIM (PEG fil gra stim) lowers the risk of infection in people who are receiving chemotherapy. It works by helping your body make more white blood cells, which protects your body from infection. It may also be used to help people who have been exposed to high doses of radiation. This medicine may be used for other purposes; ask your health care provider or pharmacist if you have questions. COMMON BRAND NAME(S): Fulphila, Fylnetra, Neulasta, Nyvepria, Stimufend, UDENYCA, UDENYCA ONBODY, Ziextenzo What should I tell my care team before I take this medication? They need to know if you have any of these conditions: Kidney disease Latex allergy Ongoing radiation therapy Sickle cell disease Skin reactions to acrylic adhesives (On-Body Injector  only) An unusual or allergic reaction to pegfilgrastim, filgrastim, other medications, foods, dyes, or preservatives Pregnant or trying to get pregnant Breast-feeding How should I use this medication? This medication is for injection under the skin. If you get this medication at home, you will be taught how to prepare and give the pre-filled syringe or how to use the On-body Injector. Refer to the patient Instructions for Use for detailed instructions. Use exactly as directed. Tell your care team immediately if you suspect that the On-body Injector may not have performed as intended or if you suspect the use of the On-body Injector resulted in a missed or partial dose. It is important that you put your used needles and syringes in a special sharps container. Do not put them in a trash can. If you do not have a sharps container, call your pharmacist or care team to get one. Talk to your care team about the use of this medication in children. While this medication may be prescribed for selected conditions, precautions do apply. Overdosage: If you think you have taken too much of this medicine contact a poison control center or emergency room at once. NOTE: This medicine is only for you. Do not share this medicine with others. What if I miss a dose? It is important not to miss your dose. Call your care team if you miss your dose. If you miss a dose due to an On-body Injector failure or leakage, a new dose should be administered as soon as possible using a single prefilled syringe for manual use. What may interact with this medication? Interactions   have not been studied. This list may not describe all possible interactions. Give your health care provider a list of all the medicines, herbs, non-prescription drugs, or dietary supplements you use. Also tell them if you smoke, drink alcohol, or use illegal drugs. Some items may interact with your medicine. What should I watch for while using this  medication? Your condition will be monitored carefully while you are receiving this medication. You may need blood work done while you are taking this medication. Talk to your care team about your risk of cancer. You may be more at risk for certain types of cancer if you take this medication. If you are going to need a MRI, CT scan, or other procedure, tell your care team that you are using this medication (On-Body Injector only). What side effects may I notice from receiving this medication? Side effects that you should report to your care team as soon as possible: Allergic reactions--skin rash, itching, hives, swelling of the face, lips, tongue, or throat Capillary leak syndrome--stomach or muscle pain, unusual weakness or fatigue, feeling faint or lightheaded, decrease in the amount of urine, swelling of the ankles, hands, or feet, trouble breathing High white blood cell level--fever, fatigue, trouble breathing, night sweats, change in vision, weight loss Inflammation of the aorta--fever, fatigue, back, chest, or stomach pain, severe headache Kidney injury (glomerulonephritis)--decrease in the amount of urine, red or dark brown urine, foamy or bubbly urine, swelling of the ankles, hands, or feet Shortness of breath or trouble breathing Spleen injury--pain in upper left stomach or shoulder Unusual bruising or bleeding Side effects that usually do not require medical attention (report to your care team if they continue or are bothersome): Bone pain Pain in the hands or feet This list may not describe all possible side effects. Call your doctor for medical advice about side effects. You may report side effects to FDA at 1-800-FDA-1088. Where should I keep my medication? Keep out of the reach of children. If you are using this medication at home, you will be instructed on how to store it. Throw away any unused medication after the expiration date on the label. NOTE: This sheet is a summary. It  may not cover all possible information. If you have questions about this medicine, talk to your doctor, pharmacist, or health care provider.  2024 Elsevier/Gold Standard (2021-02-14 00:00:00)        To help prevent nausea and vomiting after your treatment, we encourage you to take your nausea medication as directed.  BELOW ARE SYMPTOMS THAT SHOULD BE REPORTED IMMEDIATELY: *FEVER GREATER THAN 100.4 F (38 C) OR HIGHER *CHILLS OR SWEATING *NAUSEA AND VOMITING THAT IS NOT CONTROLLED WITH YOUR NAUSEA MEDICATION *UNUSUAL SHORTNESS OF BREATH *UNUSUAL BRUISING OR BLEEDING *URINARY PROBLEMS (pain or burning when urinating, or frequent urination) *BOWEL PROBLEMS (unusual diarrhea, constipation, pain near the anus) TENDERNESS IN MOUTH AND THROAT WITH OR WITHOUT PRESENCE OF ULCERS (sore throat, sores in mouth, or a toothache) UNUSUAL RASH, SWELLING OR PAIN  UNUSUAL VAGINAL DISCHARGE OR ITCHING   Items with * indicate a potential emergency and should be followed up as soon as possible or go to the Emergency Department if any problems should occur.  Please show the CHEMOTHERAPY ALERT CARD or IMMUNOTHERAPY ALERT CARD at check-in to the Emergency Department and triage nurse.  Should you have questions after your visit or need to cancel or reschedule your appointment, please contact [facility] [place] [phone]  and follow the prompts.  Office hours   are 8:00 a.m. to 4:30 p.m. Monday - Friday. Please note that voicemails left after 4:00 p.m. may not be returned until the following business day.  We are closed weekends and major holidays. You have access to a nurse at all times for urgent questions. Please call the main number to the clinic [phone] and follow the prompts.  For any non-urgent questions, you may also contact your provider using MyChart. We now offer e-Visits for anyone 18 and older to request care online for non-urgent symptoms. For details visit  [email or web address].   Also download the MyChart app! Go to the app store, search "MyChart", open the app, select [place], and log in with your MyChart username and password.   

## 2022-11-06 NOTE — Progress Notes (Signed)
Patient here for Udenyca injection. Patient tolerated injection in right arm with no complaints voiced.  Site clean and dry with no bruising or swelling noted.  No complaints of pain.  Discharged with vital signs stable and no signs or symptoms of distress noted.

## 2022-11-07 ENCOUNTER — Other Ambulatory Visit: Payer: Self-pay

## 2022-11-09 DIAGNOSIS — I700 Atherosclerosis of aorta: Secondary | ICD-10-CM | POA: Diagnosis not present

## 2022-11-09 DIAGNOSIS — Z1331 Encounter for screening for depression: Secondary | ICD-10-CM | POA: Diagnosis not present

## 2022-11-09 DIAGNOSIS — I4891 Unspecified atrial fibrillation: Secondary | ICD-10-CM | POA: Diagnosis not present

## 2022-11-09 DIAGNOSIS — Z1339 Encounter for screening examination for other mental health and behavioral disorders: Secondary | ICD-10-CM | POA: Diagnosis not present

## 2022-11-09 DIAGNOSIS — Z299 Encounter for prophylactic measures, unspecified: Secondary | ICD-10-CM | POA: Diagnosis not present

## 2022-11-09 DIAGNOSIS — Z0000 Encounter for general adult medical examination without abnormal findings: Secondary | ICD-10-CM | POA: Diagnosis not present

## 2022-11-09 DIAGNOSIS — C3490 Malignant neoplasm of unspecified part of unspecified bronchus or lung: Secondary | ICD-10-CM | POA: Diagnosis not present

## 2022-11-09 DIAGNOSIS — Z7189 Other specified counseling: Secondary | ICD-10-CM | POA: Diagnosis not present

## 2022-11-09 DIAGNOSIS — Z87891 Personal history of nicotine dependence: Secondary | ICD-10-CM | POA: Diagnosis not present

## 2022-11-16 ENCOUNTER — Inpatient Hospital Stay: Payer: Medicare PPO

## 2022-11-16 ENCOUNTER — Inpatient Hospital Stay: Payer: Medicare PPO | Admitting: Hematology

## 2022-11-17 ENCOUNTER — Inpatient Hospital Stay: Payer: Medicare PPO

## 2022-11-17 DIAGNOSIS — C3411 Malignant neoplasm of upper lobe, right bronchus or lung: Secondary | ICD-10-CM | POA: Diagnosis not present

## 2022-11-17 DIAGNOSIS — Z79899 Other long term (current) drug therapy: Secondary | ICD-10-CM | POA: Diagnosis not present

## 2022-11-17 DIAGNOSIS — Z5111 Encounter for antineoplastic chemotherapy: Secondary | ICD-10-CM | POA: Diagnosis not present

## 2022-11-17 DIAGNOSIS — E2839 Other primary ovarian failure: Secondary | ICD-10-CM | POA: Diagnosis not present

## 2022-11-17 DIAGNOSIS — D6481 Anemia due to antineoplastic chemotherapy: Secondary | ICD-10-CM

## 2022-11-17 LAB — CBC
HCT: 25.1 % — ABNORMAL LOW (ref 36.0–46.0)
Hemoglobin: 8 g/dL — ABNORMAL LOW (ref 12.0–15.0)
MCH: 30.9 pg (ref 26.0–34.0)
MCHC: 31.9 g/dL (ref 30.0–36.0)
MCV: 96.9 fL (ref 80.0–100.0)
Platelets: 28 10*3/uL — CL (ref 150–400)
RBC: 2.59 MIL/uL — ABNORMAL LOW (ref 3.87–5.11)
RDW: 18.3 % — ABNORMAL HIGH (ref 11.5–15.5)
WBC: 16.6 10*3/uL — ABNORMAL HIGH (ref 4.0–10.5)
nRBC: 0 % (ref 0.0–0.2)

## 2022-11-17 LAB — SAMPLE TO BLOOD BANK

## 2022-11-17 MED ORDER — HEPARIN SOD (PORK) LOCK FLUSH 100 UNIT/ML IV SOLN
500.0000 [IU] | Freq: Once | INTRAVENOUS | Status: AC
  Administered 2022-11-17: 500 [IU] via INTRAVENOUS

## 2022-11-17 MED ORDER — SODIUM CHLORIDE 0.9% FLUSH
10.0000 mL | Freq: Once | INTRAVENOUS | Status: AC
  Administered 2022-11-17: 10 mL via INTRAVENOUS

## 2022-11-17 NOTE — Patient Instructions (Signed)

## 2022-11-17 NOTE — Progress Notes (Signed)
CRITICAL VALUE ALERT Critical value received:  platelets 28,000 Date of notification:  11-17-22 Time of notification: 1520 Critical value read back:  Yes.   Nurse who received alert:  C. Jeniffer Culliver RN MD notified time and response:  1523,, Dr. Ellin Saba. No new orders.

## 2022-11-17 NOTE — Progress Notes (Signed)
Patients port flushed without difficulty.  Good blood return noted with no bruising or swelling noted at site.  Band aid applied.  VSS with discharge and left in satisfactory condition with no s/s of distress noted.   

## 2022-11-18 DIAGNOSIS — R69 Illness, unspecified: Secondary | ICD-10-CM | POA: Diagnosis not present

## 2022-11-18 DIAGNOSIS — L8990 Pressure ulcer of unspecified site, unspecified stage: Secondary | ICD-10-CM | POA: Diagnosis not present

## 2022-11-18 DIAGNOSIS — D849 Immunodeficiency, unspecified: Secondary | ICD-10-CM | POA: Diagnosis not present

## 2022-11-18 DIAGNOSIS — C3490 Malignant neoplasm of unspecified part of unspecified bronchus or lung: Secondary | ICD-10-CM | POA: Diagnosis not present

## 2022-11-18 DIAGNOSIS — Z299 Encounter for prophylactic measures, unspecified: Secondary | ICD-10-CM | POA: Diagnosis not present

## 2022-11-18 DIAGNOSIS — E440 Moderate protein-calorie malnutrition: Secondary | ICD-10-CM | POA: Diagnosis not present

## 2022-11-19 ENCOUNTER — Ambulatory Visit (INDEPENDENT_AMBULATORY_CARE_PROVIDER_SITE_OTHER): Payer: Medicare PPO | Admitting: Gastroenterology

## 2022-11-22 NOTE — Progress Notes (Signed)
[facility] [address], [place] [address]    Clinic Day:  11/23/2022  Referring physician: Ignatius Specking, MD  Patient Care Team: Ignatius Specking, MD as PCP - General (Internal Medicine) Pricilla Riffle, MD as PCP - Cardiology (Cardiology) Charna Elizabeth, MD as Consulting Physician (Gastroenterology) Doreatha Massed, MD as Medical Oncologist (Medical Oncology) Therese Sarah, RN as Oncology Nurse Navigator (Medical Oncology)   ASSESSMENT & PLAN:   Assessment: 1.  Recurrent extensive stage small cell lung cancer: - Patient seen at the request of Dr. Shirline Frees to get care closer to home. - 4 cycles of carboplatin and etoposide with concurrent radiation therapy for limited stage small cell lung cancer on 11/14/2009 - Status post PCI completed on 01/28/2010 - Wedge resection of the RUL by Dr. Lavinia Sharps on 09/20/2013, pathology consistent with small cell lung cancer. - 4 cycles of carboplatin and etoposide from 10/30/2013 through 01/08/2014 - PET scan on 04/23/2012: Marked hypermetabolism in the medial and anterior aspect of the left upper lobe.  Several hypermetabolic lung nodules in the left lung.  9 mm lesion in the left lower lobe.  No metastatic disease in the AP. - Bronchoscopy and FNA of the LUL nodule and LUL brushing and lavage with no malignant cells. - CT chest (08/03/2022): Scattered new nodular densities in the lungs bilaterally, largest in the left lower lobe.  New subcentimeter hypodense lesion in the left hepatic lobe 7 mm.  Periportal adenopathy 2.3 x 2.6 cm. - MRI of brain results from 08/14/2022: No metastatic disease.  Progression of small vessel ischemic disease. - PET scan from 08/13/2022 showed radiation changes in left upper lobe with focal hypermetabolism suggestive of residual recurrent tumor although this is improved from prior PET.  Dominant 8 mm subpleural nodule in the left lower lobe unchanged from recent CT and new from prior PET.  Metastatic  disease not excluded although infection/inflammation is also possible.  Prior hypermetabolic nodules on PET have resolved.  No evidence of metastatic disease in the abdomen or pelvis. - Cycle 1 of carboplatin/etoposide/durvalumab on 08/17/2022, cycle 4 on 11/02/2022.  Maintenance durvalumab started on 11/23/2022.   2.  Social/family history: - She moved back to [place] recently and lives with her husband.  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  She also worked at [employer] for 15 years.  Quit smoking in 2010 and smoked 1 pack/day for 30 years. - Mother had brain tumor.  Maternal aunt had breast cancer.    Plan: 1.  Recurrent extensive stage small cell lung cancer: - CT chest on 10/21/2022: Unchanged postradiation changes in the left hemithorax.  No signs of progression. - She has completed cycle 4 on 11/02/2022. - CBC today shows thrombocytopenia with platelet count 61.  Hemoglobin is 8.3.  Rest of the white count is normal.  LFTs show albumin 3.4. - Recommend starting maintenance with Imfinzi every 4 weeks. - RTC 8 weeks for follow-up.   2.  Gastroparesis: - Continue Reglan 5 mg 3 times daily before meals.  Continue fair life.   3.  Hypomagnesemia: - She stopped taking magnesium.  Magnesium today is 1.5.  She will receive IV magnesium.  She will restart magnesium 400 mg daily.   4.  Anemia: - Anemia from myelosuppression from chemotherapy.  Hemoglobin today is 8.3.  As we are stopping chemotherapy, her hemoglobin will improve.    Orders Placed This Encounter  Procedures   Magnesium  Standing Status:   Future    Standing Expiration Date:   03/14/2024   CBC with Differential    Standing Status:   Future    Standing Expiration Date:   03/14/2024   Comprehensive metabolic panel    Standing Status:   Future    Standing Expiration Date:   03/14/2024   Magnesium    Standing Status:   Future    Standing Expiration Date:   04/11/2024   CBC with Differential     Standing Status:   Future    Standing Expiration Date:   04/11/2024   Comprehensive metabolic panel    Standing Status:   Future    Standing Expiration Date:   04/11/2024      I,Katie Daubenspeck,acting as a scribe for Doreatha Massed, MD.,have documented all relevant documentation on the behalf of Doreatha Massed, MD,as directed by  Doreatha Massed, MD while in the presence of Doreatha Massed, MD.   I, Doreatha Massed MD, have reviewed the above documentation for accuracy and completeness, and I agree with the above.   Doreatha Massed, MD   8/26/20245:04 PM  CHIEF COMPLAINT:   Diagnosis: recurrent small cell lung cancer    Cancer Staging  Small cell lung cancer (HCC) Staging form: Lung, AJCC 7th Edition - Clinical: Limited Stage Small cell Lung Cancer - Signed by Si Gaul, MD on 05/10/2013    Prior Therapy: 1. Concurrent chemoradiation with carboplatin and etoposide, completed 11/14/2009.  2. Prophylactic cranial irradiation, completed 01/28/2010. 3. RUL wedge resection on 09/21/2013 (Dr. Laneta Simmers) 4. 4 cycles of carboplatin and etoposide, 10/30/2013 - 01/10/2014  Current Therapy:  Carboplatin, etoposide and durvalumab    HISTORY OF PRESENT ILLNESS:   Oncology History  Small cell lung cancer (HCC)  04/10/2011 Initial Diagnosis   Small cell lung cancer (HCC)   08/17/2022 -  Chemotherapy   Patient is on Treatment Plan : LUNG SMALL CELL EXTENSIVE STAGE Durvalumab + Carboplatin D1 + Etoposide D1-3 q21d x 4 Cycles / Durvalumab q28d        INTERVAL HISTORY:   Ambor is a 77 y.o. female presenting to clinic today for follow up of recurrent small cell lung cancer. She was last seen by me on 10/26/22.  Today, she states that she is doing well overall. Her appetite level is at 75%. Her energy level is at 80%.  PAST MEDICAL HISTORY:   Past Medical History: Past Medical History:  Diagnosis Date   Achalasia    Anemia    Arthritis    Bradycardia     mild,may be due to beta blocker therapy   COPD (chronic obstructive pulmonary disease) (HCC)    Gastroparesis    GERD (gastroesophageal reflux disease)    otc   Hypertension 06/01/2019   pt denies   Local recurrence of lung cancer (HCC) dx'd 07/2013   rt thoracotomy chemo comp 12/2013   Lung cancer (HCC) 03/30/2008   Dr. Arbutus Ped, finished chemo, sp radiation, left upper    Lymphocytic colitis    Pneumonia     Surgical History: Past Surgical History:  Procedure Laterality Date   BRONCHIAL BIOPSY  05/28/2022   Procedure: BRONCHIAL BIOPSIES;  Surgeon: Omar Person, MD;  Location: [facility] ENDOSCOPY;  Service: Pulmonary;;   BRONCHIAL BRUSHINGS  05/28/2022   Procedure: BRONCHIAL BRUSHINGS;  Surgeon: Omar Person, MD;  Location: [facility] ENDOSCOPY;  Service: Pulmonary;;   BRONCHIAL NEEDLE ASPIRATION BIOPSY  05/28/2022   Procedure: BRONCHIAL NEEDLE ASPIRATION BIOPSIES;  Surgeon: Omar Person, MD;  Location: [facility] ENDOSCOPY;  Service: Pulmonary;;   BRONCHIAL WASHINGS  05/28/2022   Procedure: BRONCHIAL WASHINGS;  Surgeon: Omar Person, MD;  [facility] ENDOSCOPY;  Service: Pulmonary;;   BRONCHOSCOPY  2011   DILATION AND CURETTAGE OF UTERUS     ESOPHAGOGASTRODUODENOSCOPY (EGD) WITH PROPOFOL N/A 07/29/2021   Procedure: ESOPHAGOGASTRODUODENOSCOPY (EGD) WITH PROPOFOL;  Surgeon: Dolores Frame, MD;  Location: [facility] ENDO SUITE;  Service: Gastroenterology;  Laterality: N/A;  1235 ASA 2   HEMOSTASIS CONTROL  05/28/2022   Procedure: HEMOSTASIS CONTROL;  Surgeon: Omar Person, MD;  Location: [facility] ENDOSCOPY;  Service: Pulmonary;;   IR IMAGING GUIDED PORT INSERTION  08/12/2022   NECK SURGERY  1980's   THORACOTOMY Right 09/21/2013   Procedure: THORACOTOMY MAJOR;  Surgeon: Alleen Borne, MD;  Location: [facility] OR;  Service: Thoracic;  Laterality: Right;   UTERINE FIBROID SURGERY  2012   WEDGE RESECTION Right 09/21/2013   Procedure: RIGHT UPPER LOBE WEDGE RESECTION;  Surgeon: Alleen Borne,  MD;  Location: [facility] OR;  Service: Thoracic;  Laterality: Right;    Social History: Social History   Socioeconomic History   Marital status: Married    Spouse name: Not on file   Number of children: Not on file   Years of education: Not on file   Highest education level: Not on file  Occupational History   Occupation: clerical    Employer: [employer]  Tobacco Use   Smoking status: Former    Current packs/day: 0.00    Average packs/day: 1 pack/day for 35.0 years (35.0 ttl pk-yrs)    Types: Cigarettes    Start date: 03/30/1972    Quit date: 03/31/2007    Years since quitting: 15.6    Passive exposure: Past   Smokeless tobacco: Never  Vaping Use   Vaping status: Never Used  Substance and Sexual Activity   Alcohol use: Yes    Comment: occassional about 3 drinks per year   Drug use: No   Sexual activity: Never  Other Topics Concern   Not on file  Social History Narrative   Not on file   Social Determinants of Health   Financial Resource Strain: Not on file  Food Insecurity: No Food Insecurity (08/06/2022)   Hunger Vital Sign    Worried About Running Out of Food in the Last Year: Never true    Ran Out of Food in the Last Year: Never true  Transportation Needs: No Transportation Needs (08/06/2022)   PRAPARE - Administrator, Civil Service (Medical): No    Lack of Transportation (Non-Medical): No  Physical Activity: Not on file  Stress: Not on file  Social Connections: Not on file  Intimate Partner Violence: Not At Risk (08/06/2022)   Humiliation, Afraid, Rape, and Kick questionnaire    Fear of Current or Ex-Partner: No    Emotionally Abused: No    Physically Abused: No    Sexually Abused: No    Family History: Family History  Problem Relation Age of Onset   Brain cancer Mother        brain cancer   Emphysema Father    Diabetes Son    Heart disease Paternal Grandfather    Breast cancer Maternal Aunt     Current Medications:  Current Outpatient  Medications:    ascorbic acid (QC VITAMIN C WITH ROSE HIPS) 500 MG tablet, , Disp: , Rfl:    Calcium Carbonate Antacid (TUMS PO), Take 500-1,000 mg by mouth 3 (three) times  daily as needed (indigestion/heartburn.)., Disp: , Rfl:    Cholecalciferol (VITAMIN D-3) 25 MCG (1000 UT) CAPS, Take 1,000 Units by mouth in the morning., Disp: , Rfl:    Coenzyme Q10 100 MG TABS, Take 100 mg by mouth in the morning., Disp: , Rfl:    furosemide (LASIX) 20 MG tablet, Take 1 tablet (20 mg total) by mouth daily as needed., Disp: 30 tablet, Rfl: 1   lidocaine-prilocaine (EMLA) cream, Apply 1 Application topically as needed., Disp: 30 g, Rfl: 1   magnesium oxide (MAG-OX) 400 (240 Mg) MG tablet, Take 1 tablet (400 mg total) by mouth daily., Disp: 60 tablet, Rfl: 2   metoCLOPramide (REGLAN) 5 MG tablet, Take 1 tablet (5 mg total) by mouth 3 (three) times daily before meals., Disp: 180 tablet, Rfl: 3   Multiple Vitamin (MULTIVITAMIN WITH MINERALS) TABS tablet, Take 1 tablet by mouth in the morning., Disp: , Rfl:    Probiotic Product (PROBIOTIC DAILY PO), Take 1 capsule by mouth in the morning., Disp: , Rfl:    prochlorperazine (COMPAZINE) 10 MG tablet, Take 1 tablet (10 mg total) by mouth every 6 (six) hours as needed for nausea or vomiting., Disp: 30 tablet, Rfl: 3   prochlorperazine (COMPAZINE) 10 MG tablet, Take 1 tablet (10 mg total) by mouth every 6 (six) hours as needed for nausea or vomiting., Disp: 60 tablet, Rfl: 4   rosuvastatin (CRESTOR) 20 MG tablet, Take 1 tablet (20 mg total) by mouth daily., Disp: 90 tablet, Rfl: 3   tretinoin (RETIN-A) 0.1 % cream, Apply 1 application  topically at bedtime., Disp: , Rfl:    Turmeric Curcumin 500 MG CAPS, Take 500 mg by mouth in the morning., Disp: , Rfl:    ferrous sulfate 325 (65 FE) MG tablet, Take 1 tablet (325 mg total) by mouth daily., Disp: 30 tablet, Rfl: 3   lidocaine-prilocaine (EMLA) cream, Apply to affected area once, Disp: 30 g, Rfl: 3 No current  facility-administered medications for this visit.  Facility-Administered Medications Ordered in Other Visits:    sodium chloride flush (NS) 0.9 % injection 10 mL, 10 mL, Intracatheter, PRN, Doreatha Massed, MD, 10 mL at 11/23/22 1246   Allergies: Allergies  Allergen Reactions   Azithromycin Shortness Of Breath and Rash    Took Tussionex at same time Jan 2013.   Tussionex Pennkinetic Er [Hydrocod Poli-Chlorphe Poli Er] Shortness Of Breath and Rash    Took Azithromycin at same time Jan 2013   Omeprazole Rash    REVIEW OF SYSTEMS:   Review of Systems  Constitutional:  Negative for chills, fatigue and fever.  HENT:   Negative for lump/mass, mouth sores, nosebleeds, sore throat and trouble swallowing.   Eyes:  Negative for eye problems.  Respiratory:  Negative for cough and shortness of breath.   Cardiovascular:  Negative for chest pain, leg swelling and palpitations.  Gastrointestinal:  Positive for constipation. Negative for abdominal pain, diarrhea, nausea and vomiting.  Genitourinary:  Negative for bladder incontinence, difficulty urinating, dysuria, frequency, hematuria and nocturia.   Musculoskeletal:  Negative for arthralgias, back pain, flank pain, myalgias and neck pain.  Skin:  Negative for itching and rash.  Neurological:  Negative for dizziness, headaches and numbness.  Hematological:  Does not bruise/bleed easily.  Psychiatric/Behavioral:  Negative for depression, sleep disturbance and suicidal ideas. The patient is not nervous/anxious.   All other systems reviewed and are negative.    VITALS:   There were no vitals taken for this visit.  Wt Readings  from Last 3 Encounters:  11/23/22 93 lb 6.4 oz (42.4 kg)  11/02/22 93 lb 3.2 oz (42.3 kg)  10/26/22 96 lb 6.4 oz (43.7 kg)    There is no height or weight on file to calculate BMI.  Performance status (ECOG): 1 - Symptomatic but completely ambulatory  PHYSICAL EXAM:   Physical Exam Vitals and nursing note  reviewed. Exam conducted with a chaperone present.  Constitutional:      Appearance: Normal appearance.  Cardiovascular:     Rate and Rhythm: Normal rate and regular rhythm.     Pulses: Normal pulses.     Heart sounds: Normal heart sounds.  Pulmonary:     Effort: Pulmonary effort is normal.     Breath sounds: Normal breath sounds.  Abdominal:     Palpations: Abdomen is soft. There is no hepatomegaly, splenomegaly or mass.     Tenderness: There is no abdominal tenderness.  Musculoskeletal:     Right lower leg: No edema.     Left lower leg: No edema.  Lymphadenopathy:     Cervical: No cervical adenopathy.     Right cervical: No superficial, deep or posterior cervical adenopathy.    Left cervical: No superficial, deep or posterior cervical adenopathy.     Upper Body:     Right upper body: No supraclavicular or axillary adenopathy.     Left upper body: No supraclavicular or axillary adenopathy.  Neurological:     General: No focal deficit present.     Mental Status: She is alert and oriented to person, place, and time.  Psychiatric:        Mood and Affect: Mood normal.        Behavior: Behavior normal.     LABS:      Latest Ref Rng & Units 11/23/2022    8:59 AM 11/17/2022    2:19 PM 11/02/2022    8:15 AM  CBC  WBC 4.0 - 10.5 K/uL 5.4  16.6  5.9   Hemoglobin 12.0 - 15.0 g/dL 8.3  8.0  40.9   Hematocrit 36.0 - 46.0 % 25.9  25.1  35.9   Platelets 150 - 400 K/uL 61  28  109       Latest Ref Rng & Units 11/23/2022    8:59 AM 11/02/2022    8:15 AM 10/26/2022    9:17 AM  CMP  Glucose 70 - 99 mg/dL 99  94  811   BUN 8 - 23 mg/dL 12  14  17    Creatinine 0.44 - 1.00 mg/dL 9.14  7.82  9.56   Sodium 135 - 145 mmol/L 136  137  136   Potassium 3.5 - 5.1 mmol/L 3.6  3.6  3.5   Chloride 98 - 111 mmol/L 104  102  102   CO2 22 - 32 mmol/L 28  31  27    Calcium 8.9 - 10.3 mg/dL 9.4  9.3  9.2   Total Protein 6.5 - 8.1 g/dL 6.1  6.2  6.5   Total Bilirubin 0.3 - 1.2 mg/dL 0.6  0.6  1.1    Alkaline Phos 38 - 126 U/L 87  78  116   AST 15 - 41 U/L 14  19  18    ALT 0 - 44 U/L 10  15  25       No results found for: "CEA1", "CEA" / No results found for: "CEA1", "CEA" No results found for: "PSA1" No results found for: "OZH086" No results found for: "VHQ469"  No results found for: "TOTALPROTELP", "ALBUMINELP", "A1GS", "A2GS", "BETS", "BETA2SER", "GAMS", "MSPIKE", "SPEI" Lab Results  Component Value Date   TIBC 238 (L) 08/17/2022   TIBC 149 (L) 10/08/2021   TIBC 159 (L) 04/10/2011   FERRITIN 296 08/17/2022   FERRITIN 675 (H) 10/08/2021   FERRITIN 1,680 (H) 04/10/2011   IRONPCTSAT 21 08/17/2022   IRONPCTSAT 9 (L) 10/08/2021   IRONPCTSAT 18 (L) 04/10/2011   No results found for: "LDH"   STUDIES:   No results found.

## 2022-11-23 ENCOUNTER — Inpatient Hospital Stay: Payer: Medicare PPO | Admitting: Hematology

## 2022-11-23 ENCOUNTER — Inpatient Hospital Stay: Payer: Medicare PPO

## 2022-11-23 VITALS — BP 103/63 | HR 85 | Temp 97.5°F | Resp 18

## 2022-11-23 DIAGNOSIS — C3411 Malignant neoplasm of upper lobe, right bronchus or lung: Secondary | ICD-10-CM | POA: Diagnosis not present

## 2022-11-23 DIAGNOSIS — Z5111 Encounter for antineoplastic chemotherapy: Secondary | ICD-10-CM | POA: Diagnosis not present

## 2022-11-23 DIAGNOSIS — D6481 Anemia due to antineoplastic chemotherapy: Secondary | ICD-10-CM

## 2022-11-23 DIAGNOSIS — C3490 Malignant neoplasm of unspecified part of unspecified bronchus or lung: Secondary | ICD-10-CM

## 2022-11-23 DIAGNOSIS — Z79899 Other long term (current) drug therapy: Secondary | ICD-10-CM | POA: Diagnosis not present

## 2022-11-23 LAB — CBC WITH DIFFERENTIAL/PLATELET
Abs Immature Granulocytes: 0.03 10*3/uL (ref 0.00–0.07)
Basophils Absolute: 0 10*3/uL (ref 0.0–0.1)
Basophils Relative: 0 %
Eosinophils Absolute: 0 10*3/uL (ref 0.0–0.5)
Eosinophils Relative: 1 %
HCT: 25.9 % — ABNORMAL LOW (ref 36.0–46.0)
Hemoglobin: 8.3 g/dL — ABNORMAL LOW (ref 12.0–15.0)
Immature Granulocytes: 1 %
Lymphocytes Relative: 8 %
Lymphs Abs: 0.4 10*3/uL — ABNORMAL LOW (ref 0.7–4.0)
MCH: 31.6 pg (ref 26.0–34.0)
MCHC: 32 g/dL (ref 30.0–36.0)
MCV: 98.5 fL (ref 80.0–100.0)
Monocytes Absolute: 0.8 10*3/uL (ref 0.1–1.0)
Monocytes Relative: 14 %
Neutro Abs: 4.1 10*3/uL (ref 1.7–7.7)
Neutrophils Relative %: 76 %
Platelets: 61 10*3/uL — ABNORMAL LOW (ref 150–400)
RBC: 2.63 MIL/uL — ABNORMAL LOW (ref 3.87–5.11)
RDW: 19.7 % — ABNORMAL HIGH (ref 11.5–15.5)
WBC: 5.4 10*3/uL (ref 4.0–10.5)
nRBC: 0 % (ref 0.0–0.2)

## 2022-11-23 LAB — COMPREHENSIVE METABOLIC PANEL
ALT: 10 U/L (ref 0–44)
AST: 14 U/L — ABNORMAL LOW (ref 15–41)
Albumin: 3.4 g/dL — ABNORMAL LOW (ref 3.5–5.0)
Alkaline Phosphatase: 87 U/L (ref 38–126)
Anion gap: 4 — ABNORMAL LOW (ref 5–15)
BUN: 12 mg/dL (ref 8–23)
CO2: 28 mmol/L (ref 22–32)
Calcium: 9.4 mg/dL (ref 8.9–10.3)
Chloride: 104 mmol/L (ref 98–111)
Creatinine, Ser: 0.67 mg/dL (ref 0.44–1.00)
GFR, Estimated: >60 mL/min (ref >60)
Glucose, Bld: 99 mg/dL (ref 70–99)
Potassium: 3.6 mmol/L (ref 3.5–5.1)
Sodium: 136 mmol/L (ref 135–145)
Total Bilirubin: 0.6 mg/dL (ref 0.3–1.2)
Total Protein: 6.1 g/dL — ABNORMAL LOW (ref 6.5–8.1)

## 2022-11-23 LAB — SAMPLE TO BLOOD BANK

## 2022-11-23 LAB — MAGNESIUM: Magnesium: 1.5 mg/dL — ABNORMAL LOW (ref 1.7–2.4)

## 2022-11-23 MED ORDER — MAGNESIUM SULFATE 2 GM/50ML IV SOLN
2.0000 g | Freq: Once | INTRAVENOUS | Status: AC
  Administered 2022-11-23: 2 g via INTRAVENOUS
  Filled 2022-11-23: qty 50

## 2022-11-23 MED ORDER — SODIUM CHLORIDE 0.9% FLUSH
10.0000 mL | Freq: Once | INTRAVENOUS | Status: AC
  Administered 2022-11-23: 10 mL via INTRAVENOUS

## 2022-11-23 MED ORDER — HEPARIN SOD (PORK) LOCK FLUSH 100 UNIT/ML IV SOLN
500.0000 [IU] | Freq: Once | INTRAVENOUS | Status: AC | PRN
  Administered 2022-11-23: 500 [IU]

## 2022-11-23 MED ORDER — SODIUM CHLORIDE 0.9% FLUSH
10.0000 mL | INTRAVENOUS | Status: DC | PRN
  Administered 2022-11-23: 10 mL

## 2022-11-23 MED ORDER — SODIUM CHLORIDE 0.9 % IV SOLN: Freq: Once | INTRAVENOUS | Status: AC

## 2022-11-23 MED ORDER — SODIUM CHLORIDE 0.9 % IV SOLN
1500.0000 mg | Freq: Once | INTRAVENOUS | Status: AC
  Administered 2022-11-23: 1500 mg via INTRAVENOUS
  Filled 2022-11-23: qty 30

## 2022-11-23 NOTE — Patient Instructions (Signed)
[facility]  Discharge Instructions: Thank you for choosing [facility] to provide your oncology and hematology care.  If you have a lab appointment with the Cancer Center - please note that after April 8th, 2024, all labs will be drawn in the cancer center.  You do not have to check in or register with the main entrance as you have in the past but will complete your check-in in the cancer center.  Wear comfortable clothing and clothing appropriate for easy access to any Portacath or PICC line.   We strive to give you quality time with your provider. You may need to reschedule your appointment if you arrive late (15 or more minutes).  Arriving late affects you and other patients whose appointments are after yours.  Also, if you miss three or more appointments without notifying the office, you may be dismissed from the clinic at the provider's discretion.      For prescription refill requests, have your pharmacy contact our office and allow 72 hours for refills to be completed.    Today you received the following chemotherapy and/or immunotherapy agents Imfinzi    To help prevent nausea and vomiting after your treatment, we encourage you to take your nausea medication as directed.  Durvalumab Injection What is this medication? DURVALUMAB (dur VAL ue mab) treats some types of cancer. It works by helping your immune system slow or stop the spread of cancer cells. It is a monoclonal antibody. This medicine may be used for other purposes; ask your health care provider or pharmacist if you have questions. COMMON BRAND NAME(S): IMFINZI What should I tell my care team before I take this medication? They need to know if you have any of these conditions: Allogeneic stem cell transplant (uses someone else's stem cells) Autoimmune diseases, such as Crohn disease, ulcerative colitis, lupus History of chest radiation Nervous system problems, such as Guillain-Barre  syndrome, myasthenia gravis Organ transplant An unusual or allergic reaction to durvalumab, other medications, foods, dyes, or preservatives Pregnant or trying to get pregnant Breast-feeding How should I use this medication? This medication is infused into a vein. It is given by your care team in a hospital or clinic setting. A special MedGuide will be given to you before each treatment. Be sure to read this information carefully each time. Talk to your care team about the use of this medication in children. Special care may be needed. Overdosage: If you think you have taken too much of this medicine contact a poison control center or emergency room at once. NOTE: This medicine is only for you. Do not share this medicine with others. What if I miss a dose? Keep appointments for follow-up doses. It is important not to miss your dose. Call your care team if you are unable to keep an appointment. What may interact with this medication? Interactions have not been studied. This list may not describe all possible interactions. Give your health care provider a list of all the medicines, herbs, non-prescription drugs, or dietary supplements you use. Also tell them if you smoke, drink alcohol, or use illegal drugs. Some items may interact with your medicine. What should I watch for while using this medication? Your condition will be monitored carefully while you are receiving this medication. You may need blood work while taking this medication. This medication may cause serious skin reactions. They can happen weeks to months after starting the medication. Contact your care team right away if you notice   fevers or flu-like symptoms with a rash. The rash may be red or purple and then turn into blisters or peeling of the skin. You may also notice a red rash with swelling of the face, lips, or lymph nodes in your neck or under your arms. Tell your care team right away if you have any change in your  eyesight. Talk to your care team if you may be pregnant. Serious birth defects can occur if you take this medication during pregnancy and for 3 months after the last dose. You will need a negative pregnancy test before starting this medication. Contraception is recommended while taking this medication and for 3 months after the last dose. Your care team can help you find the option that works for you. Do not breastfeed while taking this medication and for 3 months after the last dose. What side effects may I notice from receiving this medication? Side effects that you should report to your care team as soon as possible: Allergic reactions--skin rash, itching, hives, swelling of the face, lips, tongue, or throat Dry cough, shortness of breath or trouble breathing Eye pain, redness, irritation, or discharge with blurry or decreased vision Heart muscle inflammation--unusual weakness or fatigue, shortness of breath, chest pain, fast or irregular heartbeat, dizziness, swelling of the ankles, feet, or hands Hormone gland problems--headache, sensitivity to light, unusual weakness or fatigue, dizziness, fast or irregular heartbeat, increased sensitivity to cold or heat, excessive sweating, constipation, hair loss, increased thirst or amount of urine, tremors or shaking, irritability Infusion reactions--chest pain, shortness of breath or trouble breathing, feeling faint or lightheaded Kidney injury (glomerulonephritis)--decrease in the amount of urine, red or dark brown urine, foamy or bubbly urine, swelling of the ankles, hands, or feet Liver injury--right upper belly pain, loss of appetite, nausea, light-colored stool, dark yellow or brown urine, yellowing skin or eyes, unusual weakness or fatigue Pain, tingling, or numbness in the hands or feet, muscle weakness, change in vision, confusion or trouble speaking, loss of balance or coordination, trouble walking, seizures Rash, fever, and swollen lymph  nodes Redness, blistering, peeling, or loosening of the skin, including inside the mouth Sudden or severe stomach pain, bloody diarrhea, fever, nausea, vomiting Side effects that usually do not require medical attention (report these to your care team if they continue or are bothersome): Bone, joint, or muscle pain Diarrhea Fatigue Loss of appetite Nausea Skin rash This list may not describe all possible side effects. Call your doctor for medical advice about side effects. You may report side effects to FDA at 1-800-FDA-1088. Where should I keep my medication? This medication is given in a hospital or clinic. It will not be stored at home. NOTE: This sheet is a summary. It may not cover all possible information. If you have questions about this medicine, talk to your doctor, pharmacist, or health care provider.  2024 Elsevier/Gold Standard (2021-07-29 00:00:00)   BELOW ARE SYMPTOMS THAT SHOULD BE REPORTED IMMEDIATELY: *FEVER GREATER THAN 100.4 F (38 C) OR HIGHER *CHILLS OR SWEATING *NAUSEA AND VOMITING THAT IS NOT CONTROLLED WITH YOUR NAUSEA MEDICATION *UNUSUAL SHORTNESS OF BREATH *UNUSUAL BRUISING OR BLEEDING *URINARY PROBLEMS (pain or burning when urinating, or frequent urination) *BOWEL PROBLEMS (unusual diarrhea, constipation, pain near the anus) TENDERNESS IN MOUTH AND THROAT WITH OR WITHOUT PRESENCE OF ULCERS (sore throat, sores in mouth, or a toothache) UNUSUAL RASH, SWELLING OR PAIN  UNUSUAL VAGINAL DISCHARGE OR ITCHING   Items with * indicate a potential emergency and should be followed up as   soon as possible or go to the Emergency Department if any problems should occur.  Please show the CHEMOTHERAPY ALERT CARD or IMMUNOTHERAPY ALERT CARD at check-in to the Emergency Department and triage nurse.  Should you have questions after your visit or need to cancel or reschedule your appointment, please contact [facility] [phone]  and follow the  prompts.  Office hours are 8:00 a.m. to 4:30 p.m. Monday - Friday. Please note that voicemails left after 4:00 p.m. may not be returned until the following business day.  We are closed weekends and major holidays. You have access to a nurse at all times for urgent questions. Please call the main number to the clinic [phone] and follow the prompts.  For any non-urgent questions, you may also contact your provider using MyChart. We now offer e-Visits for anyone 18 and older to request care online for non-urgent symptoms. For details visit [email or web address].   Also download the MyChart app! Go to the app store, search "MyChart", open the app, select [place], and log in with your MyChart username and password.   

## 2022-11-23 NOTE — Progress Notes (Signed)
Patient presents today for Imfinzi infusion. Patient is in satisfactory condition with no new complaints voiced.  Vital signs are stable.  Labs reviewed by Dr. Ellin Saba during the office visit and all other labs are within treatment parameters. Pt's platelets are 61 today, Dr.K made aware.Pt will also receive 2g IV magnesium per Dr.K's standing orders We will proceed with treatment per MD orders.   Treatment given today per MD orders. Tolerated infusion without adverse affects. Vital signs stable. No complaints at this time. Discharged from clinic ambulatory in stable condition. Alert and oriented x 3. F/U with [facility] as scheduled.

## 2022-11-23 NOTE — Progress Notes (Signed)
Patients port flushed without difficulty.  Good blood return noted with no bruising or swelling noted at site.  Stable during access and blood draw.  Patient to remain accessed for treatment. 

## 2022-11-23 NOTE — Patient Instructions (Signed)

## 2022-11-24 ENCOUNTER — Other Ambulatory Visit: Payer: Self-pay

## 2022-11-30 ENCOUNTER — Other Ambulatory Visit: Payer: Self-pay

## 2022-12-01 DIAGNOSIS — Z299 Encounter for prophylactic measures, unspecified: Secondary | ICD-10-CM | POA: Diagnosis not present

## 2022-12-01 DIAGNOSIS — R21 Rash and other nonspecific skin eruption: Secondary | ICD-10-CM | POA: Diagnosis not present

## 2022-12-01 DIAGNOSIS — L8990 Pressure ulcer of unspecified site, unspecified stage: Secondary | ICD-10-CM | POA: Diagnosis not present

## 2022-12-01 DIAGNOSIS — I4891 Unspecified atrial fibrillation: Secondary | ICD-10-CM | POA: Diagnosis not present

## 2022-12-21 ENCOUNTER — Inpatient Hospital Stay: Payer: Medicare PPO

## 2022-12-21 ENCOUNTER — Other Ambulatory Visit: Payer: Self-pay | Admitting: Oncology

## 2022-12-21 ENCOUNTER — Inpatient Hospital Stay: Payer: Medicare PPO | Attending: Nurse Practitioner

## 2022-12-21 VITALS — BP 136/57 | HR 83 | Temp 97.1°F | Resp 18 | Wt 90.1 lb

## 2022-12-21 VITALS — BP 109/49 | HR 76 | Temp 97.2°F | Resp 18

## 2022-12-21 DIAGNOSIS — C3490 Malignant neoplasm of unspecified part of unspecified bronchus or lung: Secondary | ICD-10-CM

## 2022-12-21 DIAGNOSIS — C3411 Malignant neoplasm of upper lobe, right bronchus or lung: Secondary | ICD-10-CM | POA: Diagnosis not present

## 2022-12-21 DIAGNOSIS — Z5112 Encounter for antineoplastic immunotherapy: Secondary | ICD-10-CM | POA: Diagnosis not present

## 2022-12-21 DIAGNOSIS — Z79899 Other long term (current) drug therapy: Secondary | ICD-10-CM | POA: Insufficient documentation

## 2022-12-21 DIAGNOSIS — D6481 Anemia due to antineoplastic chemotherapy: Secondary | ICD-10-CM

## 2022-12-21 DIAGNOSIS — Z95828 Presence of other vascular implants and grafts: Secondary | ICD-10-CM

## 2022-12-21 LAB — SAMPLE TO BLOOD BANK

## 2022-12-21 LAB — COMPREHENSIVE METABOLIC PANEL
ALT: 9 U/L (ref 0–44)
AST: 15 U/L (ref 15–41)
Albumin: 3.5 g/dL (ref 3.5–5.0)
Alkaline Phosphatase: 51 U/L (ref 38–126)
Anion gap: 7 (ref 5–15)
BUN: 11 mg/dL (ref 8–23)
CO2: 29 mmol/L (ref 22–32)
Calcium: 9.5 mg/dL (ref 8.9–10.3)
Chloride: 102 mmol/L (ref 98–111)
Creatinine, Ser: 0.66 mg/dL (ref 0.44–1.00)
GFR, Estimated: >60 mL/min (ref >60)
Glucose, Bld: 90 mg/dL (ref 70–99)
Potassium: 3.7 mmol/L (ref 3.5–5.1)
Sodium: 138 mmol/L (ref 135–145)
Total Bilirubin: 0.7 mg/dL (ref 0.3–1.2)
Total Protein: 6 g/dL — ABNORMAL LOW (ref 6.5–8.1)

## 2022-12-21 LAB — CBC WITH DIFFERENTIAL/PLATELET
Abs Immature Granulocytes: 0.02 10*3/uL (ref 0.00–0.07)
Basophils Absolute: 0 10*3/uL (ref 0.0–0.1)
Basophils Relative: 1 %
Eosinophils Absolute: 0.1 10*3/uL (ref 0.0–0.5)
Eosinophils Relative: 3 %
HCT: 30.1 % — ABNORMAL LOW (ref 36.0–46.0)
Hemoglobin: 10 g/dL — ABNORMAL LOW (ref 12.0–15.0)
Immature Granulocytes: 1 %
Lymphocytes Relative: 15 %
Lymphs Abs: 0.4 10*3/uL — ABNORMAL LOW (ref 0.7–4.0)
MCH: 33.4 pg (ref 26.0–34.0)
MCHC: 33.2 g/dL (ref 30.0–36.0)
MCV: 100.7 fL — ABNORMAL HIGH (ref 80.0–100.0)
Monocytes Absolute: 0.8 10*3/uL (ref 0.1–1.0)
Monocytes Relative: 27 %
Neutro Abs: 1.5 10*3/uL — ABNORMAL LOW (ref 1.7–7.7)
Neutrophils Relative %: 53 %
Platelets: 107 10*3/uL — ABNORMAL LOW (ref 150–400)
RBC: 2.99 MIL/uL — ABNORMAL LOW (ref 3.87–5.11)
RDW: 17.7 % — ABNORMAL HIGH (ref 11.5–15.5)
WBC: 2.9 10*3/uL — ABNORMAL LOW (ref 4.0–10.5)
nRBC: 0 % (ref 0.0–0.2)

## 2022-12-21 LAB — MAGNESIUM: Magnesium: 1.6 mg/dL — ABNORMAL LOW (ref 1.7–2.4)

## 2022-12-21 LAB — TSH: TSH: 1.22 u[IU]/mL (ref 0.350–4.500)

## 2022-12-21 MED ORDER — TRIAMCINOLONE 0.1 % CREAM:EUCERIN CREAM 1:1: 1.0000 | TOPICAL_CREAM | Freq: Two times a day (BID) | CUTANEOUS | 0 refills | Status: DC

## 2022-12-21 MED ORDER — HEPARIN SOD (PORK) LOCK FLUSH 100 UNIT/ML IV SOLN
500.0000 [IU] | Freq: Once | INTRAVENOUS | Status: AC | PRN
  Administered 2022-12-21: 500 [IU]

## 2022-12-21 MED ORDER — SODIUM CHLORIDE 0.9% FLUSH
10.0000 mL | INTRAVENOUS | Status: DC | PRN
  Administered 2022-12-21: 10 mL via INTRAVENOUS

## 2022-12-21 MED ORDER — SODIUM CHLORIDE 0.9 % IV SOLN
1500.0000 mg | Freq: Once | INTRAVENOUS | Status: AC
  Administered 2022-12-21: 1500 mg via INTRAVENOUS
  Filled 2022-12-21: qty 30

## 2022-12-21 MED ORDER — MAGNESIUM SULFATE 2 GM/50ML IV SOLN
2.0000 g | Freq: Once | INTRAVENOUS | Status: AC
  Administered 2022-12-21: 2 g via INTRAVENOUS
  Filled 2022-12-21: qty 50

## 2022-12-21 MED ORDER — SODIUM CHLORIDE 0.9 % IV SOLN: Freq: Once | INTRAVENOUS | Status: AC

## 2022-12-21 MED ORDER — SODIUM CHLORIDE 0.9% FLUSH
10.0000 mL | INTRAVENOUS | Status: DC | PRN
  Administered 2022-12-21: 10 mL

## 2022-12-21 NOTE — Progress Notes (Signed)
Labs meet parameters. Ok to proceed as planned.    Patient came in today for treatment. Patient developed a rash 3 weeks ago since her last treatment . Dr. Anders Simmonds notified and assessed, will proceed with treatment and MD will call in some prednisone cream for the rash.

## 2022-12-21 NOTE — Progress Notes (Signed)
I saw Claudia [name] in the infusion chair today after the nurse called me to check a rash.  Patient stated that her rash started last week it is diffuse started on the legs and is over her arms and chest area at this time.  It is red, very mildly itchy and has no discharge.  It does not limit patient of daily activities. Patient stated that it does not bother her at all.  On examination, erythematous diffuse macular rash is present over her arms, legs [more petechiae here], upper part of thorax and some in the upper back area.  No discharge.  Of note, patient is on durvalumab maintenance therapy.  This is an expected adverse event of the medication.  She has a grade 2 [moderate] rash involving less than 30% of her body surface area.  Will send prescription for topical steroids.  Advised patient to use the steroids once or twice a day and to call us back if the rash worsens, itchiness increases or if there is any discharge.  Patient can receive treatment today.  All questions and concerns were answered

## 2022-12-21 NOTE — Progress Notes (Signed)
Patients port flushed without difficulty.  Good blood return noted with no bruising or swelling noted at site.  Patient remains accessed for treatment.  

## 2022-12-23 ENCOUNTER — Other Ambulatory Visit: Payer: Self-pay | Admitting: *Deleted

## 2022-12-23 MED ORDER — TRIAMCINOLONE 0.1 % CREAM:EUCERIN CREAM 1:1: 1.0000 | TOPICAL_CREAM | Freq: Two times a day (BID) | CUTANEOUS | 0 refills | Status: AC

## 2022-12-24 ENCOUNTER — Encounter (INDEPENDENT_AMBULATORY_CARE_PROVIDER_SITE_OTHER): Payer: Self-pay | Admitting: Gastroenterology

## 2022-12-24 ENCOUNTER — Ambulatory Visit (INDEPENDENT_AMBULATORY_CARE_PROVIDER_SITE_OTHER): Payer: Medicare PPO | Admitting: Gastroenterology

## 2022-12-24 VITALS — BP 148/75 | HR 80 | Temp 98.2°F | Ht 62.0 in | Wt 91.2 lb

## 2022-12-24 DIAGNOSIS — K3184 Gastroparesis: Secondary | ICD-10-CM | POA: Diagnosis not present

## 2022-12-24 DIAGNOSIS — K224 Dyskinesia of esophagus: Secondary | ICD-10-CM

## 2022-12-24 DIAGNOSIS — E440 Moderate protein-calorie malnutrition: Secondary | ICD-10-CM

## 2022-12-24 NOTE — Progress Notes (Signed)
Referring Provider: Ignatius Specking, MD Primary Care Physician:  Ignatius Specking, MD Primary GI Physician: Dr. Levon Hedger   Chief Complaint  Patient presents with   achalasia    Follow up on achalasia. States swallowing ok. Eats about one half of her meal at a time. Takes about one half of protein shake per day. Concerns about losing weight.    HPI:   Claudia [name] is a 77 y.o. female with past medical history of  recurrent small cell lung cancer status post chemotherapy with carboplatin and etoposide, status post radiation therapy and prophylactic cranial radiation, recurrent malignancy was treated with wedge resection, possible lymphocytic colitis, COPD, GERD, severe gastroparesis, possible achalasia   Patient presenting today for follow up of gastroparesis, esophageal dysmotility and weight loss   Last seen April 2023, at that time having continued spitting up. Unsure if she had improvement with reglan or not. Eating small amounts of food. Had upcoming app[facility] with [facility]. Trying to do protein shakes to maintain weight.   Recommended to continue reglan 5mg  TID, small meals throughout the day. Proceed with manometry and keep appt with [facility]. Protein shakes 2-3x/day.   Unfortunately patient was unable to keep her appts at [facility] due to recurrence of cancer  Present: States that she is done with chemo, but now still doing immunotherapy. She cancelled manometry appt at [facility] and GI specialist appt [facility] due to chemo/ongoing appts, she is ready to get these rescheduled. She was Previously spitting up/throwing up each time she ate, but since doing chemo notes this has decreased quite bit, has had only a few issues with this since her chemo. She notes that her appetite comes and goes. She is still doing mostly only half of her meals, noting that she feels that she cannot eat anymore. She feels that swallowing is almost back to normal for her. She has very occasional nausea,  usually only occurs if she eats late. She denies any pain her abdomen. She is not doing reglan anymore as she developed a rash all over, and notes she stopped almost all of her medications. She was told by oncology that her rash is due to immunotherapy, however, she noted very little if any improvement with reglan.   She continues to lose weight. She is doing [employer] protein shake, usually half in the morning and another half later in the day. States she wanted to see a nutritionist but has not yet been referred.   She has some intermittent constipation, which she takes a stool softener for if needed.   Barium esophagram 04/2022: concerning for achalasia  Last Colonoscopy:Last Colonoscopy:2013 - sigmoid diverticulosis, patchy loss of vascular marking, biopsies performed but no results available Last Endoscopy:5/2/23Fluid in the esophagus. Fluid aspiration performed. - A large amount of food (residue) in the stomach. - Normal examined duodenum GES:08/13/21: severe delay in gastric emptying, recommend eat small meals throughout the day   Past Medical History:  Diagnosis Date   Achalasia    Anemia    Arthritis    Bradycardia    mild,may be due to beta blocker therapy   COPD (chronic obstructive pulmonary disease) (HCC)    Gastroparesis    GERD (gastroesophageal reflux disease)    otc   Hypertension 06/01/2019   pt denies   Local recurrence of lung cancer (HCC) dx'd 07/2013   rt thoracotomy chemo comp 12/2013   Lung cancer (HCC) 03/30/2008   Dr. Arbutus Ped, finished chemo, sp radiation,  left upper    Lymphocytic colitis    Pneumonia     Past Surgical History:  Procedure Laterality Date   BRONCHIAL BIOPSY  05/28/2022   Procedure: BRONCHIAL BIOPSIES;  Surgeon: Omar Person, MD;  Location[facility] ENDOSCOPY;  Service: Pulmonary;;   BRONCHIAL BRUSHINGS  05/28/2022   Procedure: BRONCHIAL BRUSHINGS;  Surgeon: Omar Person, MD;  Location: [facility] ENDOSCOPY;  Service: Pulmonary;;   BRONCHIAL  NEEDLE ASPIRATION BIOPSY  05/28/2022   Procedure: BRONCHIAL NEEDLE ASPIRATION BIOPSIES;  Surgeon: Omar Person, MD;  Location: [facility] ENDOSCOPY;  Service: Pulmonary;;   BRONCHIAL WASHINGS  05/28/2022   Procedure: BRONCHIAL WASHINGS;  Surgeon: Omar Person, MD;  [facility];  Service: Pulmonary;;   BRONCHOSCOPY  2011   DILATION AND CURETTAGE OF UTERUS     ESOPHAGOGASTRODUODENOSCOPY (EGD) WITH PROPOFOL N/A 07/29/2021   Procedure: ESOPHAGOGASTRODUODENOSCOPY (EGD) WITH PROPOFOL;  Surgeon: Dolores Frame, MD;  Location: [facility] ENDO SUITE;  Service: Gastroenterology;  Laterality: N/A;  1235 ASA 2   HEMOSTASIS CONTROL  05/28/2022   Procedure: HEMOSTASIS CONTROL;  Surgeon: Omar Person, MD;  Location: [facility] ENDOSCOPY;  Service: Pulmonary;;   IR IMAGING GUIDED PORT INSERTION  08/12/2022   NECK SURGERY  1980's   THORACOTOMY Right 09/21/2013   Procedure: THORACOTOMY MAJOR;  Surgeon: Alleen Borne, MD;  Location: [facility] OR;  Service: Thoracic;  Laterality: Right;   UTERINE FIBROID SURGERY  2012   WEDGE RESECTION Right 09/21/2013   Procedure: RIGHT UPPER LOBE WEDGE RESECTION;  Surgeon: Alleen Borne, MD;  Location: [facility] OR;  Service: Thoracic;  Laterality: Right;    Current Outpatient Medications  Medication Sig Dispense Refill   Calcium Carbonate Antacid (TUMS PO) Take 500-1,000 mg by mouth 3 (three) times daily as needed (indigestion/heartburn.).     lidocaine-prilocaine (EMLA) cream Apply 1 Application topically as needed. 30 g 1   lidocaine-prilocaine (EMLA) cream Apply to affected area once 30 g 3   magnesium oxide (MAG-OX) 400 (240 Mg) MG tablet Take 1 tablet (400 mg total) by mouth daily. 60 tablet 2   tretinoin (RETIN-A) 0.1 % cream Apply 1 application  topically at bedtime.     Triamcinolone Acetonide (TRIAMCINOLONE 0.1 % CREAM : EUCERIN) CREA Apply 1 Application topically 2 (two) times daily. 60 each 0   metoCLOPramide (REGLAN) 5 MG tablet Take 1 tablet (5 mg total) by mouth  3 (three) times daily before meals. (Patient not taking: Reported on 12/24/2022) 180 tablet 3   No current facility-administered medications for this visit.   Facility-Administered Medications Ordered in Other Visits  Medication Dose Route Frequency Provider Last Rate Last Admin   sodium chloride flush (NS) 0.9 % injection 10 mL  10 mL Intracatheter PRN Doreatha Massed, MD   10 mL at 12/21/22 1207    Allergies as of 12/24/2022 - Review Complete 11/23/2022  Allergen Reaction Noted   Azithromycin Shortness Of Breath and Rash 04/09/2011   Tussionex pennkinetic er [hydrocod poli-chlorphe poli er] Shortness Of Breath and Rash 04/09/2011   Omeprazole Rash 11/13/2021    Family History  Problem Relation Age of Onset   Brain cancer Mother        brain cancer   Emphysema Father    Diabetes Son    Heart disease Paternal Grandfather    Breast cancer Maternal Aunt     Social History   Socioeconomic History   Marital status: Married    Spouse name: Not on file   Number of children: Not on  file   Years of education: Not on file   Highest education level: Not on file  Occupational History   Occupation: clerical    Employer: [employer]  Tobacco Use   Smoking status: Former    Current packs/day: 0.00    Average packs/day: 1 pack/day for 35.0 years (35.0 ttl pk-yrs)    Types: Cigarettes    Start date: 03/30/1972    Quit date: 03/31/2007    Years since quitting: 15.7    Passive exposure: Past   Smokeless tobacco: Never  Vaping Use   Vaping status: Never Used  Substance and Sexual Activity   Alcohol use: Yes    Comment: occassional about 3 drinks per year   Drug use: No   Sexual activity: Never  Other Topics Concern   Not on file  Social History Narrative   Not on file   Social Determinants of Health   Financial Resource Strain: Not on file  Food Insecurity: No Food Insecurity (08/06/2022)   Hunger Vital Sign    Worried About Running Out of Food in the Last Year: Never  true    Ran Out of Food in the Last Year: Never true  Transportation Needs: No Transportation Needs (08/06/2022)   PRAPARE - Administrator, Civil Service (Medical): No    Lack of Transportation (Non-Medical): No  Physical Activity: Not on file  Stress: Not on file  Social Connections: Not on file    Review of systems General: negative for malaise, night sweats, fever, chills, +weight los Neck: Negative for lumps, goiter, pain and significant neck swelling Resp: Negative for cough, wheezing, dyspnea at rest CV: Negative for chest pain, leg swelling, palpitations, orthopnea GI: denies melena, hematochezia, nausea, vomiting, diarrhea,  dysphagia, odyonophagia, +early satiety  +unintentional weight loss. +constipation MSK: Negative for joint pain or swelling, back pain, and muscle pain. Derm: Negative for itching or rash Psych: Denies depression, anxiety, memory loss, confusion. No homicidal or suicidal ideation.  Heme: Negative for prolonged bleeding, bruising easily, and swollen nodes. Endocrine: Negative for cold or heat intolerance, polyuria, polydipsia and goiter. Neuro: negative for tremor, gait imbalance, syncope and seizures. The remainder of the review of systems is noncontributory.  Physical Exam: There were no vitals taken for this visit. General:   Alert and oriented. No distress noted. Pleasant and cooperative.  Head:  Normocephalic and atraumatic. Eyes:  Conjuctiva clear without scleral icterus. Mouth:  Oral mucosa pink and moist. Good dentition. No lesions. Heart: Normal rate and rhythm, s1 and s2 heart sounds present.  Lungs: Clear lung sounds in all lobes. Respirations equal and unlabored. Abdomen:  +BS, soft, non-tender and non-distended. No rebound or guarding. No HSM or masses noted. Derm: No palmar erythema or jaundice Msk:  Symmetrical without gross deformities. Normal posture. Extremities:  Without edema. Neurologic:  Alert and  oriented x4 Psych:   Alert and cooperative. Normal mood and affect.  Invalid input(s): "6 MONTHS"   ASSESSMENT: Claudia [name] is a 77 y.o. female presenting today for follow up of gastroparesis, esophageal dysmotility and weight loss  Gastroparesis and esophageal dysmotility has been difficult to manage in the past, reglan did not provide much results. She feels swallowing is almost at baseline for her currently and not having much issue with this. Spitting up with eating has also improved quite a bit, however, she continues to have a lower appetite and feels full usually after about 1/2 of her meal. She is trying to do 1 protein shake  per day, is unsure if she could tolerate more than 1 due to feeling so full. Weight has fluctuated between 90-101 lbs over the past 8 months so, suspect weight loss is due in part to cancer/chemotherapy. She previously had appts for esophageal manometry for further evaluation of her suspected esophageal dysmotility as well as [facility], she is had to cancel these due to her cancer care but is planning to call and reschedule these soon. At this time, I encouraged her to continue with protein shake, try to increase to BID if able, have frequent small meals throughout the day and I will place referral for nutritionist as this may be beneficial for her.    PLAN:  Continue with frequent small meals  2. Continue Protein shakes, try to increase to BID  3. Reschedule esophageal manometry and appt with Dr. Arlie Solomons  4. Nutrition referral   All questions were answered, patient verbalized understanding and is in agreement with plan as outlined above.   Follow Up: 6 months   Kaede Clendenen L. Jeanmarie Hubert, MSN, APRN, AGNP-C Adult-Gerontology Nurse Practitioner [facility]  I have reviewed the note and agree with the APP's assessment as described in this progress note  Katrinka Blazing, MD Gastroenterology and Hepatology [facility]

## 2022-12-24 NOTE — Patient Instructions (Signed)
Continue with frequent small meals  Try increasing protein shakes to twice daily if possible  Reschedule esophageal manometry at [facility] and appt with Dr. Arlie Solomons at [facility], you can let me know if you need these numbers  I will send a nutrition referral as this may be helpful for you  Follow up 6 months  It was a pleasure to see you today. I want to create trusting relationships with patients and provide genuine, compassionate, and quality care. I truly value your feedback! please be on the lookout for a survey regarding your visit with me today. I appreciate your input about our visit and your time in completing this!    Oksana Deberry L. Jeanmarie Hubert, MSN, APRN, AGNP-C Adult-Gerontology Nurse Practitioner [facility] Gastroenterology at [facility]

## 2022-12-25 ENCOUNTER — Other Ambulatory Visit: Payer: Self-pay

## 2022-12-28 ENCOUNTER — Encounter: Payer: Self-pay | Admitting: Hematology

## 2022-12-31 ENCOUNTER — Other Ambulatory Visit: Payer: Self-pay | Admitting: Physician Assistant

## 2023-01-05 ENCOUNTER — Encounter: Payer: Self-pay | Admitting: Hematology

## 2023-01-11 ENCOUNTER — Inpatient Hospital Stay: Payer: Medicare PPO

## 2023-01-11 ENCOUNTER — Inpatient Hospital Stay: Payer: Medicare PPO | Admitting: Hematology

## 2023-01-14 DIAGNOSIS — K220 Achalasia of cardia: Secondary | ICD-10-CM | POA: Diagnosis not present

## 2023-01-14 DIAGNOSIS — R12 Heartburn: Secondary | ICD-10-CM | POA: Diagnosis not present

## 2023-01-15 DIAGNOSIS — K2289 Other specified disease of esophagus: Secondary | ICD-10-CM | POA: Diagnosis not present

## 2023-01-15 DIAGNOSIS — R12 Heartburn: Secondary | ICD-10-CM | POA: Diagnosis not present

## 2023-01-15 DIAGNOSIS — R1110 Vomiting, unspecified: Secondary | ICD-10-CM | POA: Diagnosis not present

## 2023-01-18 ENCOUNTER — Inpatient Hospital Stay: Payer: Medicare PPO

## 2023-01-18 ENCOUNTER — Encounter (INDEPENDENT_AMBULATORY_CARE_PROVIDER_SITE_OTHER): Payer: Self-pay | Admitting: Gastroenterology

## 2023-01-18 ENCOUNTER — Telehealth (INDEPENDENT_AMBULATORY_CARE_PROVIDER_SITE_OTHER): Payer: Self-pay | Admitting: Gastroenterology

## 2023-01-18 ENCOUNTER — Other Ambulatory Visit: Payer: Self-pay

## 2023-01-18 ENCOUNTER — Inpatient Hospital Stay: Payer: Medicare PPO | Attending: Nurse Practitioner | Admitting: Hematology

## 2023-01-18 ENCOUNTER — Encounter: Payer: Self-pay | Admitting: Hematology

## 2023-01-18 VITALS — BP 128/70 | HR 84 | Temp 96.5°F | Resp 18

## 2023-01-18 VITALS — BP 133/59 | HR 85 | Temp 97.2°F | Resp 19 | Wt 89.2 lb

## 2023-01-18 DIAGNOSIS — Z79899 Other long term (current) drug therapy: Secondary | ICD-10-CM | POA: Diagnosis not present

## 2023-01-18 DIAGNOSIS — C3411 Malignant neoplasm of upper lobe, right bronchus or lung: Secondary | ICD-10-CM

## 2023-01-18 DIAGNOSIS — D6481 Anemia due to antineoplastic chemotherapy: Secondary | ICD-10-CM

## 2023-01-18 DIAGNOSIS — Z5112 Encounter for antineoplastic immunotherapy: Secondary | ICD-10-CM | POA: Diagnosis not present

## 2023-01-18 DIAGNOSIS — Z95828 Presence of other vascular implants and grafts: Secondary | ICD-10-CM

## 2023-01-18 LAB — CBC WITH DIFFERENTIAL/PLATELET
Abs Immature Granulocytes: 0.02 10*3/uL (ref 0.00–0.07)
Basophils Absolute: 0 10*3/uL (ref 0.0–0.1)
Basophils Relative: 1 %
Eosinophils Absolute: 0 10*3/uL (ref 0.0–0.5)
Eosinophils Relative: 1 %
HCT: 30.7 % — ABNORMAL LOW (ref 36.0–46.0)
Hemoglobin: 10.3 g/dL — ABNORMAL LOW (ref 12.0–15.0)
Immature Granulocytes: 1 %
Lymphocytes Relative: 17 %
Lymphs Abs: 0.5 10*3/uL — ABNORMAL LOW (ref 0.7–4.0)
MCH: 34.1 pg — ABNORMAL HIGH (ref 26.0–34.0)
MCHC: 33.6 g/dL (ref 30.0–36.0)
MCV: 101.7 fL — ABNORMAL HIGH (ref 80.0–100.0)
Monocytes Absolute: 0.5 10*3/uL (ref 0.1–1.0)
Monocytes Relative: 19 %
Neutro Abs: 1.7 10*3/uL (ref 1.7–7.7)
Neutrophils Relative %: 61 %
Platelets: 67 10*3/uL — ABNORMAL LOW (ref 150–400)
RBC: 3.02 MIL/uL — ABNORMAL LOW (ref 3.87–5.11)
RDW: 14.2 % (ref 11.5–15.5)
WBC: 2.8 10*3/uL — ABNORMAL LOW (ref 4.0–10.5)
nRBC: 0 % (ref 0.0–0.2)

## 2023-01-18 LAB — MAGNESIUM: Magnesium: 1.7 mg/dL (ref 1.7–2.4)

## 2023-01-18 LAB — SAMPLE TO BLOOD BANK

## 2023-01-18 LAB — COMPREHENSIVE METABOLIC PANEL
ALT: 10 U/L (ref 0–44)
AST: 17 U/L (ref 15–41)
Albumin: 3.7 g/dL (ref 3.5–5.0)
Alkaline Phosphatase: 54 U/L (ref 38–126)
Anion gap: 5 (ref 5–15)
BUN: 19 mg/dL (ref 8–23)
CO2: 28 mmol/L (ref 22–32)
Calcium: 9.9 mg/dL (ref 8.9–10.3)
Chloride: 103 mmol/L (ref 98–111)
Creatinine, Ser: 0.68 mg/dL (ref 0.44–1.00)
GFR, Estimated: >60 mL/min (ref >60)
Glucose, Bld: 99 mg/dL (ref 70–99)
Potassium: 3.8 mmol/L (ref 3.5–5.1)
Sodium: 136 mmol/L (ref 135–145)
Total Bilirubin: 0.7 mg/dL (ref 0.3–1.2)
Total Protein: 6.6 g/dL (ref 6.5–8.1)

## 2023-01-18 MED ORDER — SODIUM CHLORIDE 0.9% FLUSH
10.0000 mL | INTRAVENOUS | Status: DC | PRN
  Administered 2023-01-18: 10 mL via INTRAVENOUS

## 2023-01-18 MED ORDER — HEPARIN SOD (PORK) LOCK FLUSH 100 UNIT/ML IV SOLN
500.0000 [IU] | Freq: Once | INTRAVENOUS | Status: AC | PRN
  Administered 2023-01-18: 500 [IU]

## 2023-01-18 MED ORDER — SODIUM CHLORIDE 0.9% FLUSH
10.0000 mL | INTRAVENOUS | Status: DC | PRN
  Administered 2023-01-18: 10 mL

## 2023-01-18 MED ORDER — SODIUM CHLORIDE 0.9 % IV SOLN
1500.0000 mg | Freq: Once | INTRAVENOUS | Status: AC
  Administered 2023-01-18: 1500 mg via INTRAVENOUS
  Filled 2023-01-18: qty 30

## 2023-01-18 MED ORDER — SODIUM CHLORIDE 0.9 % IV SOLN: Freq: Once | INTRAVENOUS | Status: AC

## 2023-01-18 NOTE — Progress Notes (Signed)
[facility] [address] [place] [address]    Clinic Day:  01/18/2023  Referring physician: Ignatius Specking, MD  Patient Care Team: Ignatius Specking, MD as PCP - General (Internal Medicine) Pricilla Riffle, MD as PCP - Cardiology (Cardiology) Charna Elizabeth, MD as Consulting Physician (Gastroenterology) Doreatha Massed, MD as Medical Oncologist (Medical Oncology) Therese Sarah, RN as Oncology Nurse Navigator (Medical Oncology)   ASSESSMENT & PLAN:   Assessment: 1.  Recurrent extensive stage small cell lung cancer: - Patient seen at the request of Dr. Shirline Frees to get care closer to home. - 4 cycles of carboplatin and etoposide with concurrent radiation therapy for limited stage small cell lung cancer on 11/14/2009 - Status post PCI completed on 01/28/2010 - Wedge resection of the RUL by Dr. Lavinia Sharps on 09/20/2013, pathology consistent with small cell lung cancer. - 4 cycles of carboplatin and etoposide from 10/30/2013 through 01/08/2014 - PET scan on 04/23/2012: Marked hypermetabolism in the medial and anterior aspect of the left upper lobe.  Several hypermetabolic lung nodules in the left lung.  9 mm lesion in the left lower lobe.  No metastatic disease in the AP. - Bronchoscopy and FNA of the LUL nodule and LUL brushing and lavage with no malignant cells. - CT chest (08/03/2022): Scattered new nodular densities in the lungs bilaterally, largest in the left lower lobe.  New subcentimeter hypodense lesion in the left hepatic lobe 7 mm.  Periportal adenopathy 2.3 x 2.6 cm. - MRI of brain results from 08/14/2022: No metastatic disease.  Progression of small vessel ischemic disease. - PET scan from 08/13/2022 showed radiation changes in left upper lobe with focal hypermetabolism suggestive of residual recurrent tumor although this is improved from prior PET.  Dominant 8 mm subpleural nodule in the left lower lobe unchanged from recent CT and new from prior PET.  Metastatic  disease not excluded although infection/inflammation is also possible.  Prior hypermetabolic nodules on PET have resolved.  No evidence of metastatic disease in the abdomen or pelvis. - Cycle 1 of carboplatin/etoposide/durvalumab on 08/17/2022, cycle 4 on 11/02/2022.  Maintenance durvalumab started on 11/23/2022.   2.  Social/family history: - She moved back to [place] recently and lives with her husband.  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  She also worked at [employer] for 15 years.  Quit smoking in 2010 and smoked 1 pack/day for 30 years. - Mother had brain tumor.  Maternal aunt had breast cancer.    Plan: 1.  Recurrent extensive stage small cell lung cancer: - CT chest on 10/21/2022: Unchanged postradiation changes in the left hemithorax with no signs of progression. - She is tolerating durvalumab every 4 weeks reasonably well except rash. - No other immunotherapy related side effects. - Reviewed labs today: Normal LFTs and creatinine.  CBC shows leukopenia and thrombocytopenia with hemoglobin around 10.3.  TSH is 1.2. - Recommend proceeding with treatment today and in 4 weeks.  RTC 8 weeks for follow-up.  Will obtain CT CAP with contrast prior to next visit.   2.  Gastroparesis: - She stopped taking Reglan.  She has appointment with GI for endoscopy.   3.  Hypomagnesemia: - Continue magnesium once daily.  Magnesium is normal.   4.  Skin rash: - She was evaluated on 12/21/2022 for rash on the dorsum of hands, legs and chest and was given triamcinolone cream. - Rash improved on the chest.  Still has  some rash on the legs and dorsum of the hands. - No blistering/itching/pain. - Continue triamcinolone twice daily.    No orders of the defined types were placed in this encounter.      Doreatha Massed, MD   10/21/202411:53 AM  CHIEF COMPLAINT:   Diagnosis: recurrent small cell lung cancer    Cancer Staging  Small cell lung cancer  (HCC) Staging form: Lung, AJCC 7th Edition - Clinical: Limited Stage Small cell Lung Cancer - Signed by Si Gaul, MD on 05/10/2013    Prior Therapy: 1. Concurrent chemoradiation with carboplatin and etoposide, completed 11/14/2009.  2. Prophylactic cranial irradiation, completed 01/28/2010. 3. RUL wedge resection on 09/21/2013 (Dr. Laneta Simmers) 4. 4 cycles of carboplatin and etoposide, 10/30/2013 - 01/10/2014  Current Therapy:  Carboplatin, etoposide and durvalumab    HISTORY OF PRESENT ILLNESS:   Oncology History  Small cell lung cancer (HCC)  04/10/2011 Initial Diagnosis   Small cell lung cancer (HCC)   08/17/2022 -  Chemotherapy   Patient is on Treatment Plan : LUNG SMALL CELL EXTENSIVE STAGE Durvalumab + Carboplatin D1 + Etoposide D1-3 q21d x 4 Cycles / Durvalumab q28d        INTERVAL HISTORY:   Claudia [name] is a 77 y.o. female seen for follow-up of recurrent small cell lung cancer.  Appetite is reported as 25% and energy levels of 75%.  She reportedly developed a rash and is using triamcinolone cream.  PAST MEDICAL HISTORY:   Past Medical History: Past Medical History:  Diagnosis Date   Achalasia    Anemia    Arthritis    Bradycardia    mild,may be due to beta blocker therapy   COPD (chronic obstructive pulmonary disease) (HCC)    Gastroparesis    GERD (gastroesophageal reflux disease)    otc   Hypertension 06/01/2019   pt denies   Local recurrence of lung cancer (HCC) dx'd 07/2013   rt thoracotomy chemo comp 12/2013   Lung cancer (HCC) 03/30/2008   Dr. Arbutus Ped, finished chemo, sp radiation, left upper    Lymphocytic colitis    Pneumonia     Surgical History: Past Surgical History:  Procedure Laterality Date   BRONCHIAL BIOPSY  05/28/2022   Procedure: BRONCHIAL BIOPSIES;  Surgeon: Omar Person, MD;  Location: [facility] ENDOSCOPY;  Service: Pulmonary;;   BRONCHIAL BRUSHINGS  05/28/2022   Procedure: BRONCHIAL BRUSHINGS;  Surgeon: Omar Person, MD;  Location: [facility]  ENDOSCOPY;  Service: Pulmonary;;   BRONCHIAL NEEDLE ASPIRATION BIOPSY  05/28/2022   Procedure: BRONCHIAL NEEDLE ASPIRATION BIOPSIES;  Surgeon: Omar Person, MD;  Location: [facility] ENDOSCOPY;  Service: Pulmonary;;   BRONCHIAL WASHINGS  05/28/2022   Procedure: BRONCHIAL WASHINGS;  Surgeon: Omar Person, MD;  Location: [facility] ENDOSCOPY;  Service: Pulmonary;;   BRONCHOSCOPY  2011   DILATION AND CURETTAGE OF UTERUS     ESOPHAGOGASTRODUODENOSCOPY (EGD) WITH PROPOFOL N/A 07/29/2021   Procedure: ESOPHAGOGASTRODUODENOSCOPY (EGD) WITH PROPOFOL;  Surgeon: Dolores Frame, MD;  Location: [facility] ENDO SUITE;  Service: Gastroenterology;  Laterality: N/A;  1235 ASA 2   HEMOSTASIS CONTROL  05/28/2022   Procedure: HEMOSTASIS CONTROL;  Surgeon: Omar Person, MD;  Location[facility] ENDOSCOPY;  Service: Pulmonary;;   IR IMAGING GUIDED PORT INSERTION  08/12/2022   NECK SURGERY  1980's   THORACOTOMY Right 09/21/2013   Procedure: THORACOTOMY MAJOR;  Surgeon: Alleen Borne, MD;  Location: [facility] OR;  Service: Thoracic;  Laterality: Right;   UTERINE FIBROID SURGERY  2012   WEDGE RESECTION Right 09/21/2013  Procedure: RIGHT UPPER LOBE WEDGE RESECTION;  Surgeon: Alleen Borne, MD;  Location: [facility] OR;  Service: Thoracic;  Laterality: Right;    Social History: Social History   Socioeconomic History   Marital status: Married    Spouse name: Not on file   Number of children: Not on file   Years of education: Not on file   Highest education level: Not on file  Occupational History   Occupation: clerical    Employer: [employer]  Tobacco Use   Smoking status: Former    Current packs/day: 0.00    Average packs/day: 1 pack/day for 35.0 years (35.0 ttl pk-yrs)    Types: Cigarettes    Start date: 03/30/1972    Quit date: 03/31/2007    Years since quitting: 15.8    Passive exposure: Past   Smokeless tobacco: Never  Vaping Use   Vaping status: Never Used  Substance and Sexual Activity   Alcohol use: Yes     Comment: occassional about 3 drinks per year   Drug use: No   Sexual activity: Never  Other Topics Concern   Not on file  Social History Narrative   Not on file   Social Determinants of Health   Financial Resource Strain: Not on file  Food Insecurity: No Food Insecurity (08/06/2022)   Hunger Vital Sign    Worried About Running Out of Food in the Last Year: Never true    Ran Out of Food in the Last Year: Never true  Transportation Needs: No Transportation Needs (08/06/2022)   PRAPARE - Administrator, Civil Service (Medical): No    Lack of Transportation (Non-Medical): No  Physical Activity: Not on file  Stress: Not on file  Social Connections: Not on file  Intimate Partner Violence: Not At Risk (08/06/2022)   Humiliation, Afraid, Rape, and Kick questionnaire    Fear of Current or Ex-Partner: No    Emotionally Abused: No    Physically Abused: No    Sexually Abused: No    Family History: Family History  Problem Relation Age of Onset   Brain cancer Mother        brain cancer   Emphysema Father    Diabetes Son    Heart disease Paternal Grandfather    Breast cancer Maternal Aunt     Current Medications:  Current Outpatient Medications:    Calcium Carbonate Antacid (TUMS PO), Take 500-1,000 mg by mouth 3 (three) times daily as needed (indigestion/heartburn.)., Disp: , Rfl:    lidocaine-prilocaine (EMLA) cream, Apply to affected area once, Disp: 30 g, Rfl: 3   magnesium oxide (MAG-OX) 400 (240 Mg) MG tablet, Take 1 tablet (400 mg total) by mouth daily., Disp: 60 tablet, Rfl: 2   metoCLOPramide (REGLAN) 5 MG tablet, Take 1 tablet (5 mg total) by mouth 3 (three) times daily before meals., Disp: 180 tablet, Rfl: 3   tretinoin (RETIN-A) 0.1 % cream, Apply 1 application  topically at bedtime., Disp: , Rfl:    Triamcinolone Acetonide (TRIAMCINOLONE 0.1 % CREAM : EUCERIN) CREA, Apply 1 Application topically 2 (two) times daily., Disp: 60 each, Rfl: 0   lidocaine-prilocaine  (EMLA) cream, Apply 1 Application topically as needed. (Patient not taking: Reported on 01/18/2023), Disp: 30 g, Rfl: 1   Allergies: Allergies  Allergen Reactions   Azithromycin Shortness Of Breath and Rash    Took Tussionex at same time Jan 2013.   Tussionex Pennkinetic Er [Hydrocod Poli-Chlorphe Poli Er] Shortness Of Breath and Rash  Took Azithromycin at same time Jan 2013   Omeprazole Rash    REVIEW OF SYSTEMS:   Review of Systems  Constitutional:  Positive for fatigue. Negative for chills and fever.  HENT:   Negative for lump/mass, mouth sores, nosebleeds, sore throat and trouble swallowing.   Eyes:  Negative for eye problems.  Respiratory:  Negative for cough and shortness of breath.   Cardiovascular:  Negative for chest pain, leg swelling and palpitations.  Gastrointestinal:  Positive for constipation. Negative for abdominal pain, diarrhea, nausea and vomiting.  Genitourinary:  Negative for bladder incontinence, difficulty urinating, dysuria, frequency, hematuria and nocturia.   Musculoskeletal:  Negative for arthralgias, back pain, flank pain, myalgias and neck pain.  Skin:  Positive for rash. Negative for itching.  Neurological:  Negative for dizziness, headaches and numbness.  Hematological:  Does not bruise/bleed easily.  Psychiatric/Behavioral:  Negative for depression, sleep disturbance and suicidal ideas. The patient is not nervous/anxious.   All other systems reviewed and are negative.    VITALS:   There were no vitals taken for this visit.  Wt Readings from Last 3 Encounters:  01/18/23 89 lb 3.2 oz (40.5 kg)  12/24/22 91 lb 3.2 oz (41.4 kg)  12/21/22 90 lb 1.6 oz (40.9 kg)    There is no height or weight on file to calculate BMI.  Performance status (ECOG): 1 - Symptomatic but completely ambulatory  PHYSICAL EXAM:   Physical Exam Vitals and nursing note reviewed. Exam conducted with a chaperone present.  Constitutional:      Appearance: Normal  appearance.  Cardiovascular:     Rate and Rhythm: Normal rate and regular rhythm.     Pulses: Normal pulses.     Heart sounds: Normal heart sounds.  Pulmonary:     Effort: Pulmonary effort is normal.     Breath sounds: Normal breath sounds.  Abdominal:     Palpations: Abdomen is soft. There is no hepatomegaly, splenomegaly or mass.     Tenderness: There is no abdominal tenderness.  Musculoskeletal:     Right lower leg: No edema.     Left lower leg: No edema.  Lymphadenopathy:     Cervical: No cervical adenopathy.     Right cervical: No superficial, deep or posterior cervical adenopathy.    Left cervical: No superficial, deep or posterior cervical adenopathy.     Upper Body:     Right upper body: No supraclavicular or axillary adenopathy.     Left upper body: No supraclavicular or axillary adenopathy.  Neurological:     General: No focal deficit present.     Mental Status: She is alert and oriented to person, place, and time.  Psychiatric:        Mood and Affect: Mood normal.        Behavior: Behavior normal.     LABS:      Latest Ref Rng & Units 01/18/2023   10:28 AM 12/21/2022    8:10 AM 11/23/2022    8:59 AM  CBC  WBC 4.0 - 10.5 K/uL 2.8  2.9  5.4   Hemoglobin 12.0 - 15.0 g/dL 78.2  95.6  8.3   Hematocrit 36.0 - 46.0 % 30.7  30.1  25.9   Platelets 150 - 400 K/uL 67  107  61       Latest Ref Rng & Units 01/18/2023   10:28 AM 12/21/2022    8:10 AM 11/23/2022    8:59 AM  CMP  Glucose 70 - 99 mg/dL 99  90  99   BUN 8 - 23 mg/dL 19  11  12    Creatinine 0.44 - 1.00 mg/dL 2.13  0.86  5.78   Sodium 135 - 145 mmol/L 136  138  136   Potassium 3.5 - 5.1 mmol/L 3.8  3.7  3.6   Chloride 98 - 111 mmol/L 103  102  104   CO2 22 - 32 mmol/L 28  29  28    Calcium 8.9 - 10.3 mg/dL 9.9  9.5  9.4   Total Protein 6.5 - 8.1 g/dL 6.6  6.0  6.1   Total Bilirubin 0.3 - 1.2 mg/dL 0.7  0.7  0.6   Alkaline Phos 38 - 126 U/L 54  51  87   AST 15 - 41 U/L 17  15  14    ALT 0 - 44 U/L 10  9   10       No results found for: "CEA1", "CEA" / No results found for: "CEA1", "CEA" No results found for: "PSA1" No results found for: "ION629" No results found for: "CAN125"  No results found for: "TOTALPROTELP", "ALBUMINELP", "A1GS", "A2GS", "BETS", "BETA2SER", "GAMS", "MSPIKE", "SPEI" Lab Results  Component Value Date   TIBC 238 (L) 08/17/2022   TIBC 149 (L) 10/08/2021   TIBC 159 (L) 04/10/2011   FERRITIN 296 08/17/2022   FERRITIN 675 (H) 10/08/2021   FERRITIN 1,680 (H) 04/10/2011   IRONPCTSAT 21 08/17/2022   IRONPCTSAT 9 (L) 10/08/2021   IRONPCTSAT 18 (L) 04/10/2011   No results found for: "LDH"   STUDIES:   No results found.

## 2023-01-18 NOTE — Patient Instructions (Signed)

## 2023-01-18 NOTE — Progress Notes (Signed)
Patients port flushed without difficulty.  Good blood return noted with no bruising or swelling noted at site.  Patient remains accessed for treatment.  

## 2023-01-18 NOTE — Telephone Encounter (Signed)
I received the results of the manometry performed on 01/14/2023 which showed normal IRP of 12.1, 100% incomplete bolus clearance consistent with absent peristalsis.  Notably, the patient had a time esophagram on 05/21/2022 which showed dilated atonic esophagus with tapering at the GE junction that was initially concerning for achalasia.  Hi Ann, can you please refer the patient to [facility] for an evaluation with Dr. Ebony Cargo? Diagnosis: Dysphagia with abnormal esophagram initially concerning for achalasia and manometry with aperistalsis. Please fax the report of the most recent esophagram.  Thanks,   Katrinka Blazing, MD Gastroenterology and Hepatology [facility] Gastroenterology

## 2023-01-18 NOTE — Progress Notes (Signed)
Platelets 67 today with ok to treat per Dr. Ellin Saba.

## 2023-01-18 NOTE — Progress Notes (Signed)
Patient has been examined by Dr. Katragadda. Vital signs and labs have been reviewed by MD - ANC, Creatinine, LFTs, hemoglobin, and platelets are within treatment parameters per M.D. - pt may proceed with treatment.  Primary RN and pharmacy notified.  

## 2023-01-18 NOTE — Progress Notes (Signed)
Patient tolerated treatment well with no complaints voiced.  Patient left ambulatory with husband in stable condition.  Vital signs stable at discharge.  Follow up as scheduled.

## 2023-01-18 NOTE — Patient Instructions (Signed)
[facility]  Discharge Instructions: Thank you for choosing [facility] to provide your oncology and hematology care.  If you have a lab appointment with the Cancer Center - please note that after April 8th, 2024, all labs will be drawn in the cancer center.  You do not have to check in or register with the main entrance as you have in the past but will complete your check-in in the cancer center.  Wear comfortable clothing and clothing appropriate for easy access to any Portacath or PICC line.   We strive to give you quality time with your provider. You may need to reschedule your appointment if you arrive late (15 or more minutes).  Arriving late affects you and other patients whose appointments are after yours.  Also, if you miss three or more appointments without notifying the office, you may be dismissed from the clinic at the provider's discretion.      For prescription refill requests, have your pharmacy contact our office and allow 72 hours for refills to be completed.    Today you received the following chemotherapy and/or immunotherapy agents imfinzi   To help prevent nausea and vomiting after your treatment, we encourage you to take your nausea medication as directed.  BELOW ARE SYMPTOMS THAT SHOULD BE REPORTED IMMEDIATELY: *FEVER GREATER THAN 100.4 F (38 C) OR HIGHER *CHILLS OR SWEATING *NAUSEA AND VOMITING THAT IS NOT CONTROLLED WITH YOUR NAUSEA MEDICATION *UNUSUAL SHORTNESS OF BREATH *UNUSUAL BRUISING OR BLEEDING *URINARY PROBLEMS (pain or burning when urinating, or frequent urination) *BOWEL PROBLEMS (unusual diarrhea, constipation, pain near the anus) TENDERNESS IN MOUTH AND THROAT WITH OR WITHOUT PRESENCE OF ULCERS (sore throat, sores in mouth, or a toothache) UNUSUAL RASH, SWELLING OR PAIN  UNUSUAL VAGINAL DISCHARGE OR ITCHING   Items with * indicate a potential emergency and should be followed up as soon as possible or go to the  Emergency Department if any problems should occur.  Please show the CHEMOTHERAPY ALERT CARD or IMMUNOTHERAPY ALERT CARD at check-in to the Emergency Department and triage nurse.  Should you have questions after your visit or need to cancel or reschedule your appointment, please contact [facility] [phone]  and follow the prompts.  Office hours are 8:00 a.m. to 4:30 p.m. Monday - Friday. Please note that voicemails left after 4:00 p.m. may not be returned until the following business day.  We are closed weekends and major holidays. You have access to a nurse at all times for urgent questions. Please call the main number to the clinic [phone] and follow the prompts.  For any non-urgent questions, you may also contact your provider using MyChart. We now offer e-Visits for anyone 67 and older to request care online for non-urgent symptoms. For details visit [email or web address].   Also download the MyChart app! Go to the app store, search "MyChart", open the app, select [place], and log in with your MyChart username and password.

## 2023-01-19 NOTE — Telephone Encounter (Signed)
Thank you :)

## 2023-02-04 DIAGNOSIS — Z0000 Encounter for general adult medical examination without abnormal findings: Secondary | ICD-10-CM | POA: Diagnosis not present

## 2023-02-04 DIAGNOSIS — Z299 Encounter for prophylactic measures, unspecified: Secondary | ICD-10-CM | POA: Diagnosis not present

## 2023-02-04 DIAGNOSIS — Z87891 Personal history of nicotine dependence: Secondary | ICD-10-CM | POA: Diagnosis not present

## 2023-02-04 DIAGNOSIS — E7800 Pure hypercholesterolemia, unspecified: Secondary | ICD-10-CM | POA: Diagnosis not present

## 2023-02-04 DIAGNOSIS — R5383 Other fatigue: Secondary | ICD-10-CM | POA: Diagnosis not present

## 2023-02-04 DIAGNOSIS — Z79899 Other long term (current) drug therapy: Secondary | ICD-10-CM | POA: Diagnosis not present

## 2023-02-08 ENCOUNTER — Inpatient Hospital Stay: Payer: Medicare PPO

## 2023-02-08 ENCOUNTER — Inpatient Hospital Stay: Payer: Medicare PPO | Admitting: Hematology

## 2023-02-09 ENCOUNTER — Ambulatory Visit: Payer: Medicare PPO | Admitting: Nutrition

## 2023-02-11 DIAGNOSIS — H1132 Conjunctival hemorrhage, left eye: Secondary | ICD-10-CM | POA: Diagnosis not present

## 2023-02-11 DIAGNOSIS — I700 Atherosclerosis of aorta: Secondary | ICD-10-CM | POA: Diagnosis not present

## 2023-02-11 DIAGNOSIS — H109 Unspecified conjunctivitis: Secondary | ICD-10-CM | POA: Diagnosis not present

## 2023-02-11 DIAGNOSIS — Z299 Encounter for prophylactic measures, unspecified: Secondary | ICD-10-CM | POA: Diagnosis not present

## 2023-02-12 ENCOUNTER — Encounter: Payer: Self-pay | Admitting: Hematology

## 2023-02-15 ENCOUNTER — Inpatient Hospital Stay: Payer: Medicare PPO | Attending: Nurse Practitioner

## 2023-02-15 ENCOUNTER — Ambulatory Visit: Payer: Medicare PPO | Admitting: Hematology

## 2023-02-15 ENCOUNTER — Inpatient Hospital Stay: Payer: Medicare PPO

## 2023-02-15 VITALS — BP 136/54 | HR 78 | Temp 96.6°F | Resp 18

## 2023-02-15 DIAGNOSIS — Z5112 Encounter for antineoplastic immunotherapy: Secondary | ICD-10-CM | POA: Insufficient documentation

## 2023-02-15 DIAGNOSIS — T451X5A Adverse effect of antineoplastic and immunosuppressive drugs, initial encounter: Secondary | ICD-10-CM

## 2023-02-15 DIAGNOSIS — Z79899 Other long term (current) drug therapy: Secondary | ICD-10-CM | POA: Insufficient documentation

## 2023-02-15 DIAGNOSIS — D6481 Anemia due to antineoplastic chemotherapy: Secondary | ICD-10-CM

## 2023-02-15 DIAGNOSIS — C3411 Malignant neoplasm of upper lobe, right bronchus or lung: Secondary | ICD-10-CM

## 2023-02-15 LAB — CBC WITH DIFFERENTIAL/PLATELET
Abs Immature Granulocytes: 0.01 10*3/uL (ref 0.00–0.07)
Basophils Absolute: 0 10*3/uL (ref 0.0–0.1)
Basophils Relative: 1 %
Eosinophils Absolute: 0 10*3/uL (ref 0.0–0.5)
Eosinophils Relative: 1 %
HCT: 32.1 % — ABNORMAL LOW (ref 36.0–46.0)
Hemoglobin: 10.8 g/dL — ABNORMAL LOW (ref 12.0–15.0)
Immature Granulocytes: 0 %
Lymphocytes Relative: 19 %
Lymphs Abs: 0.4 10*3/uL — ABNORMAL LOW (ref 0.7–4.0)
MCH: 34 pg (ref 26.0–34.0)
MCHC: 33.6 g/dL (ref 30.0–36.0)
MCV: 100.9 fL — ABNORMAL HIGH (ref 80.0–100.0)
Monocytes Absolute: 0.5 10*3/uL (ref 0.1–1.0)
Monocytes Relative: 22 %
Neutro Abs: 1.3 10*3/uL — ABNORMAL LOW (ref 1.7–7.7)
Neutrophils Relative %: 57 %
Platelets: 87 10*3/uL — ABNORMAL LOW (ref 150–400)
RBC: 3.18 MIL/uL — ABNORMAL LOW (ref 3.87–5.11)
RDW: 13.2 % (ref 11.5–15.5)
WBC: 2.2 10*3/uL — ABNORMAL LOW (ref 4.0–10.5)
nRBC: 0 % (ref 0.0–0.2)

## 2023-02-15 LAB — COMPREHENSIVE METABOLIC PANEL
ALT: 9 U/L (ref 0–44)
AST: 16 U/L (ref 15–41)
Albumin: 3.6 g/dL (ref 3.5–5.0)
Alkaline Phosphatase: 56 U/L (ref 38–126)
Anion gap: 7 (ref 5–15)
BUN: 15 mg/dL (ref 8–23)
CO2: 28 mmol/L (ref 22–32)
Calcium: 9.8 mg/dL (ref 8.9–10.3)
Chloride: 101 mmol/L (ref 98–111)
Creatinine, Ser: 0.82 mg/dL (ref 0.44–1.00)
GFR, Estimated: >60 mL/min (ref >60)
Glucose, Bld: 113 mg/dL — ABNORMAL HIGH (ref 70–99)
Potassium: 3.7 mmol/L (ref 3.5–5.1)
Sodium: 136 mmol/L (ref 135–145)
Total Bilirubin: 0.9 mg/dL (ref <1.2)
Total Protein: 6.6 g/dL (ref 6.5–8.1)

## 2023-02-15 LAB — SAMPLE TO BLOOD BANK

## 2023-02-15 LAB — MAGNESIUM: Magnesium: 1.7 mg/dL (ref 1.7–2.4)

## 2023-02-15 MED ORDER — SODIUM CHLORIDE 0.9 % IV SOLN
1500.0000 mg | Freq: Once | INTRAVENOUS | Status: AC
  Administered 2023-02-15: 1500 mg via INTRAVENOUS
  Filled 2023-02-15: qty 30

## 2023-02-15 MED ORDER — HEPARIN SOD (PORK) LOCK FLUSH 100 UNIT/ML IV SOLN
500.0000 [IU] | Freq: Once | INTRAVENOUS | Status: AC | PRN
  Administered 2023-02-15: 500 [IU]

## 2023-02-15 MED ORDER — SODIUM CHLORIDE 0.9% FLUSH
10.0000 mL | INTRAVENOUS | Status: DC | PRN
  Administered 2023-02-15: 10 mL

## 2023-02-15 MED ORDER — SODIUM CHLORIDE 0.9 % IV SOLN: Freq: Once | INTRAVENOUS | Status: AC

## 2023-02-15 NOTE — Patient Instructions (Signed)
 [facility] - [facility] DEPT OF [facility]  Discharge Instructions: Thank you for choosing [facility] to provide your oncology and hematology care.  If you have a lab appointment with the Cancer Center - please note that after April 8th, 2024, all labs will be drawn in the cancer center.  You do not have to check in or register with the main entrance as you have in the past but will complete your check-in in the cancer center.  Wear comfortable clothing and clothing appropriate for easy access to any Portacath or PICC line.   We strive to give you quality time with your provider. You may need to reschedule your appointment if you arrive late (15 or more minutes).  Arriving late affects you and other patients whose appointments are after yours.  Also, if you miss three or more appointments without notifying the office, you may be dismissed from the clinic at the provider's discretion.      For prescription refill requests, have your pharmacy contact our office and allow 72 hours for refills to be completed.    Today you received the following chemotherapy and/or immunotherapy agents Imfinzi   To help prevent nausea and vomiting after your treatment, we encourage you to take your nausea medication as directed.  Durvalumab Injection What is this medication? DURVALUMAB (dur VAL ue mab) treats some types of cancer. It works by helping your immune system slow or stop the spread of cancer cells. It is a monoclonal antibody. This medicine may be used for other purposes; ask your health care provider or pharmacist if you have questions. COMMON BRAND NAME(S): IMFINZI What should I tell my care team before I take this medication? They need to know if you have any of these conditions: Allogeneic stem cell transplant (uses someone else's stem cells) Autoimmune diseases, such as Crohn disease, ulcerative colitis, lupus History of chest radiation Nervous system  problems, such as Guillain-Barre syndrome, myasthenia gravis Organ transplant An unusual or allergic reaction to durvalumab, other medications, foods, dyes, or preservatives Pregnant or trying to get pregnant Breast-feeding How should I use this medication? This medication is infused into a vein. It is given by your care team in a hospital or clinic setting. A special MedGuide will be given to you before each treatment. Be sure to read this information carefully each time. Talk to your care team about the use of this medication in children. Special care may be needed. Overdosage: If you think you have taken too much of this medicine contact a poison control center or emergency room at once. NOTE: This medicine is only for you. Do not share this medicine with others. What if I miss a dose? Keep appointments for follow-up doses. It is important not to miss your dose. Call your care team if you are unable to keep an appointment. What may interact with this medication? Interactions have not been studied. This list may not describe all possible interactions. Give your health care provider a list of all the medicines, herbs, non-prescription drugs, or dietary supplements you use. Also tell them if you smoke, drink alcohol, or use illegal drugs. Some items may interact with your medicine. What should I watch for while using this medication? Your condition will be monitored carefully while you are receiving this medication. You may need blood work while taking this medication. This medication may cause serious skin reactions. They can happen weeks to months after starting the medication. Contact your  care team right away if you notice fevers or flu-like symptoms with a rash. The rash may be red or purple and then turn into blisters or peeling of the skin. You may also notice a red rash with swelling of the face, lips, or lymph nodes in your neck or under your arms. Tell your care team right away if you  have any change in your eyesight. Talk to your care team if you may be pregnant. Serious birth defects can occur if you take this medication during pregnancy and for 3 months after the last dose. You will need a negative pregnancy test before starting this medication. Contraception is recommended while taking this medication and for 3 months after the last dose. Your care team can help you find the option that works for you. Do not breastfeed while taking this medication and for 3 months after the last dose. What side effects may I notice from receiving this medication? Side effects that you should report to your care team as soon as possible: Allergic reactions--skin rash, itching, hives, swelling of the face, lips, tongue, or throat Dry cough, shortness of breath or trouble breathing Eye pain, redness, irritation, or discharge with blurry or decreased vision Heart muscle inflammation--unusual weakness or fatigue, shortness of breath, chest pain, fast or irregular heartbeat, dizziness, swelling of the ankles, feet, or hands Hormone gland problems--headache, sensitivity to light, unusual weakness or fatigue, dizziness, fast or irregular heartbeat, increased sensitivity to cold or heat, excessive sweating, constipation, hair loss, increased thirst or amount of urine, tremors or shaking, irritability Infusion reactions--chest pain, shortness of breath or trouble breathing, feeling faint or lightheaded Kidney injury (glomerulonephritis)--decrease in the amount of urine, red or dark brown urine, foamy or bubbly urine, swelling of the ankles, hands, or feet Liver injury--right upper belly pain, loss of appetite, nausea, light-colored stool, dark yellow or brown urine, yellowing skin or eyes, unusual weakness or fatigue Pain, tingling, or numbness in the hands or feet, muscle weakness, change in vision, confusion or trouble speaking, loss of balance or coordination, trouble walking, seizures Rash, fever, and  swollen lymph nodes Redness, blistering, peeling, or loosening of the skin, including inside the mouth Sudden or severe stomach pain, bloody diarrhea, fever, nausea, vomiting Side effects that usually do not require medical attention (report these to your care team if they continue or are bothersome): Bone, joint, or muscle pain Diarrhea Fatigue Loss of appetite Nausea Skin rash This list may not describe all possible side effects. Call your doctor for medical advice about side effects. You may report side effects to FDA at 1-800-FDA-1088. Where should I keep my medication? This medication is given in a hospital or clinic. It will not be stored at home. NOTE: This sheet is a summary. It may not cover all possible information. If you have questions about this medicine, talk to your doctor, pharmacist, or health care provider.  2024 Elsevier/Gold Standard (2021-07-29 00:00:00)   BELOW ARE SYMPTOMS THAT SHOULD BE REPORTED IMMEDIATELY: *FEVER GREATER THAN 100.4 F (38 C) OR HIGHER *CHILLS OR SWEATING *NAUSEA AND VOMITING THAT IS NOT CONTROLLED WITH YOUR NAUSEA MEDICATION *UNUSUAL SHORTNESS OF BREATH *UNUSUAL BRUISING OR BLEEDING *URINARY PROBLEMS (pain or burning when urinating, or frequent urination) *BOWEL PROBLEMS (unusual diarrhea, constipation, pain near the anus) TENDERNESS IN MOUTH AND THROAT WITH OR WITHOUT PRESENCE OF ULCERS (sore throat, sores in mouth, or a toothache) UNUSUAL RASH, SWELLING OR PAIN  UNUSUAL VAGINAL DISCHARGE OR ITCHING   Items with * indicate a potential  emergency and should be followed up as soon as possible or go to the Emergency Department if any problems should occur.  Please show the CHEMOTHERAPY ALERT CARD or IMMUNOTHERAPY ALERT CARD at check-in to the Emergency Department and triage nurse.  Should you have questions after your visit or need to cancel or reschedule your appointment, please contact [facility] - A DEPT OF [facility] [phone]  and follow the prompts.  Office hours are 8:00 a.m. to 4:30 p.m. Monday - Friday. Please note that voicemails left after 4:00 p.m. may not be returned until the following business day.  We are closed weekends and major holidays. You have access to a nurse at all times for urgent questions. Please call the main number to the clinic [phone] and follow the prompts.  For any non-urgent questions, you may also contact your provider using MyChart. We now offer e-Visits for anyone 36 and older to request care online for non-urgent symptoms. For details visit [email or web address].   Also download the MyChart app! Go to the app store, search "MyChart", open the app, select [place], and log in with your MyChart username and password.

## 2023-02-15 NOTE — Progress Notes (Signed)
Patient presents today for Imfinzi infusion.  Patient is in satisfactory condition with no new complaints voiced.  Vital signs are stable. Labs reviewed and all other labs are within treatment parameters. Patient's ANC 1.3 and platelets 87, Dr.Kandala notified. We will proceed with treatment per MD orders.    Treatment given today per MD orders. Tolerated infusion without adverse affects. Vital signs stable. No complaints at this time. Discharged from clinic ambulatory in stable condition. Alert and oriented x 3. F/U with [facility] as scheduled.

## 2023-02-24 ENCOUNTER — Other Ambulatory Visit: Payer: Self-pay

## 2023-02-24 DIAGNOSIS — C3411 Malignant neoplasm of upper lobe, right bronchus or lung: Secondary | ICD-10-CM

## 2023-03-09 ENCOUNTER — Inpatient Hospital Stay: Payer: Medicare PPO | Attending: Nurse Practitioner

## 2023-03-09 ENCOUNTER — Ambulatory Visit (HOSPITAL_COMMUNITY)
Admission: RE | Admit: 2023-03-09 | Discharge: 2023-03-09 | Disposition: A | Discharge status: Home / Self Care | Payer: Medicare PPO | Source: Ambulatory Visit | Attending: Hematology | Admitting: Hematology

## 2023-03-09 DIAGNOSIS — C3411 Malignant neoplasm of upper lobe, right bronchus or lung: Secondary | ICD-10-CM | POA: Insufficient documentation

## 2023-03-09 DIAGNOSIS — Z79899 Other long term (current) drug therapy: Secondary | ICD-10-CM | POA: Diagnosis not present

## 2023-03-09 DIAGNOSIS — K2289 Other specified disease of esophagus: Secondary | ICD-10-CM | POA: Diagnosis not present

## 2023-03-09 DIAGNOSIS — J439 Emphysema, unspecified: Secondary | ICD-10-CM | POA: Diagnosis not present

## 2023-03-09 DIAGNOSIS — D259 Leiomyoma of uterus, unspecified: Secondary | ICD-10-CM | POA: Diagnosis not present

## 2023-03-09 DIAGNOSIS — Z5112 Encounter for antineoplastic immunotherapy: Secondary | ICD-10-CM | POA: Diagnosis not present

## 2023-03-09 LAB — CBC WITH DIFFERENTIAL/PLATELET
Abs Immature Granulocytes: 0.11 10*3/uL — ABNORMAL HIGH (ref 0.00–0.07)
Basophils Absolute: 0.1 10*3/uL (ref 0.0–0.1)
Basophils Relative: 1 %
Eosinophils Absolute: 0 10*3/uL (ref 0.0–0.5)
Eosinophils Relative: 0 %
HCT: 32.1 % — ABNORMAL LOW (ref 36.0–46.0)
Hemoglobin: 10.4 g/dL — ABNORMAL LOW (ref 12.0–15.0)
Immature Granulocytes: 2 %
Lymphocytes Relative: 7 %
Lymphs Abs: 0.4 10*3/uL — ABNORMAL LOW (ref 0.7–4.0)
MCH: 32.3 pg (ref 26.0–34.0)
MCHC: 32.4 g/dL (ref 30.0–36.0)
MCV: 99.7 fL (ref 80.0–100.0)
Monocytes Absolute: 1.1 10*3/uL — ABNORMAL HIGH (ref 0.1–1.0)
Monocytes Relative: 16 %
Neutro Abs: 4.8 10*3/uL (ref 1.7–7.7)
Neutrophils Relative %: 74 %
Platelets: 102 10*3/uL — ABNORMAL LOW (ref 150–400)
RBC: 3.22 MIL/uL — ABNORMAL LOW (ref 3.87–5.11)
RDW: 13.7 % (ref 11.5–15.5)
WBC: 6.5 10*3/uL (ref 4.0–10.5)
nRBC: 0 % (ref 0.0–0.2)

## 2023-03-09 LAB — MAGNESIUM: Magnesium: 1.6 mg/dL — ABNORMAL LOW (ref 1.7–2.4)

## 2023-03-09 LAB — COMPREHENSIVE METABOLIC PANEL
ALT: 8 U/L (ref 0–44)
AST: 14 U/L — ABNORMAL LOW (ref 15–41)
Albumin: 3.5 g/dL (ref 3.5–5.0)
Alkaline Phosphatase: 65 U/L (ref 38–126)
Anion gap: 9 (ref 5–15)
BUN: 15 mg/dL (ref 8–23)
CO2: 26 mmol/L (ref 22–32)
Calcium: 9.8 mg/dL (ref 8.9–10.3)
Chloride: 103 mmol/L (ref 98–111)
Creatinine, Ser: 0.76 mg/dL (ref 0.44–1.00)
GFR, Estimated: >60 mL/min (ref >60)
Glucose, Bld: 94 mg/dL (ref 70–99)
Potassium: 3.6 mmol/L (ref 3.5–5.1)
Sodium: 138 mmol/L (ref 135–145)
Total Bilirubin: 0.6 mg/dL (ref <1.2)
Total Protein: 6.5 g/dL (ref 6.5–8.1)

## 2023-03-09 MED ORDER — HEPARIN SOD (PORK) LOCK FLUSH 100 UNIT/ML IV SOLN
500.0000 [IU] | Freq: Once | INTRAVENOUS | Status: AC
  Administered 2023-03-09: 500 [IU] via INTRAVENOUS

## 2023-03-09 MED ORDER — IOHEXOL 300 MG/ML  SOLN
75.0000 mL | Freq: Once | INTRAMUSCULAR | Status: AC | PRN
  Administered 2023-03-09: 75 mL via INTRAVENOUS

## 2023-03-09 MED ORDER — SODIUM CHLORIDE 0.9% FLUSH
10.0000 mL | Freq: Once | INTRAVENOUS | Status: AC
  Administered 2023-03-09: 10 mL via INTRAVENOUS

## 2023-03-09 NOTE — Progress Notes (Signed)
Patients port flushed without difficulty.  Good blood return noted with no bruising or swelling noted at site.  Dressing intact.   VSS with discharge and left in satisfactory condition with no s/s of distress noted.

## 2023-03-15 ENCOUNTER — Inpatient Hospital Stay: Payer: Medicare PPO | Admitting: Hematology

## 2023-03-15 ENCOUNTER — Inpatient Hospital Stay: Payer: Medicare PPO

## 2023-03-15 VITALS — BP 119/49 | HR 87 | Temp 96.6°F | Resp 18

## 2023-03-15 VITALS — BP 134/70 | HR 93 | Temp 98.0°F | Resp 17 | Wt 86.4 lb

## 2023-03-15 DIAGNOSIS — D508 Other iron deficiency anemias: Secondary | ICD-10-CM

## 2023-03-15 DIAGNOSIS — Z5112 Encounter for antineoplastic immunotherapy: Secondary | ICD-10-CM | POA: Diagnosis not present

## 2023-03-15 DIAGNOSIS — C3411 Malignant neoplasm of upper lobe, right bronchus or lung: Secondary | ICD-10-CM

## 2023-03-15 DIAGNOSIS — Z79899 Other long term (current) drug therapy: Secondary | ICD-10-CM | POA: Diagnosis not present

## 2023-03-15 MED ORDER — HEPARIN SOD (PORK) LOCK FLUSH 100 UNIT/ML IV SOLN
500.0000 [IU] | Freq: Once | INTRAVENOUS | Status: AC | PRN
  Administered 2023-03-15: 500 [IU]

## 2023-03-15 MED ORDER — SODIUM CHLORIDE 0.9 % IV SOLN
1500.0000 mg | Freq: Once | INTRAVENOUS | Status: AC
  Administered 2023-03-15: 1500 mg via INTRAVENOUS
  Filled 2023-03-15: qty 30

## 2023-03-15 MED ORDER — SODIUM CHLORIDE 0.9% FLUSH
10.0000 mL | INTRAVENOUS | Status: DC | PRN
  Administered 2023-03-15: 10 mL

## 2023-03-15 MED ORDER — SODIUM CHLORIDE 0.9 % IV SOLN: Freq: Once | INTRAVENOUS | Status: AC

## 2023-03-15 NOTE — Patient Instructions (Signed)
[facility]  Discharge Instructions: Thank you for choosing [facility] to provide your oncology and hematology care.  If you have a lab appointment with the Cancer Center - please note that after April 8th, 2024, all labs will be drawn in the cancer center.  You do not have to check in or register with the main entrance as you have in the past but will complete your check-in in the cancer center.  Wear comfortable clothing and clothing appropriate for easy access to any Portacath or PICC line.   We strive to give you quality time with your provider. You may need to reschedule your appointment if you arrive late (15 or more minutes).  Arriving late affects you and other patients whose appointments are after yours.  Also, if you miss three or more appointments without notifying the office, you may be dismissed from the clinic at the provider's discretion.      For prescription refill requests, have your pharmacy contact our office and allow 72 hours for refills to be completed.    Today you received the following chemotherapy and/or immunotherapy agents Imfinzi   To help prevent nausea and vomiting after your treatment, we encourage you to take your nausea medication as directed.  Durvalumab Injection What is this medication? DURVALUMAB (dur VAL ue mab) treats some types of cancer. It works by helping your immune system slow or stop the spread of cancer cells. It is a monoclonal antibody. This medicine may be used for other purposes; ask your health care provider or pharmacist if you have questions. COMMON BRAND NAME(S): IMFINZI What should I tell my care team before I take this medication? They need to know if you have any of these conditions: Allogeneic stem cell transplant (uses someone else's stem cells) Autoimmune diseases, such as Crohn disease, ulcerative colitis, lupus History of chest radiation Nervous system  problems, such as Guillain-Barre syndrome, myasthenia gravis Organ transplant An unusual or allergic reaction to durvalumab, other medications, foods, dyes, or preservatives Pregnant or trying to get pregnant Breast-feeding How should I use this medication? This medication is infused into a vein. It is given by your care team in a hospital or clinic setting. A special MedGuide will be given to you before each treatment. Be sure to read this information carefully each time. Talk to your care team about the use of this medication in children. Special care may be needed. Overdosage: If you think you have taken too much of this medicine contact a poison control center or emergency room at once. NOTE: This medicine is only for you. Do not share this medicine with others. What if I miss a dose? Keep appointments for follow-up doses. It is important not to miss your dose. Call your care team if you are unable to keep an appointment. What may interact with this medication? Interactions have not been studied. This list may not describe all possible interactions. Give your health care provider a list of all the medicines, herbs, non-prescription drugs, or dietary supplements you use. Also tell them if you smoke, drink alcohol, or use illegal drugs. Some items may interact with your medicine. What should I watch for while using this medication? Your condition will be monitored carefully while you are receiving this medication. You may need blood work while taking this medication. This medication may cause serious skin reactions. They can happen weeks to months after starting the medication. Contact  your care team right away if you notice fevers or flu-like symptoms with a rash. The rash may be red or purple and then turn into blisters or peeling of the skin. You may also notice a red rash with swelling of the face, lips, or lymph nodes in your neck or under your arms. Tell your care team right away if you  have any change in your eyesight. Talk to your care team if you may be pregnant. Serious birth defects can occur if you take this medication during pregnancy and for 3 months after the last dose. You will need a negative pregnancy test before starting this medication. Contraception is recommended while taking this medication and for 3 months after the last dose. Your care team can help you find the option that works for you. Do not breastfeed while taking this medication and for 3 months after the last dose. What side effects may I notice from receiving this medication? Side effects that you should report to your care team as soon as possible: Allergic reactions--skin rash, itching, hives, swelling of the face, lips, tongue, or throat Dry cough, shortness of breath or trouble breathing Eye pain, redness, irritation, or discharge with blurry or decreased vision Heart muscle inflammation--unusual weakness or fatigue, shortness of breath, chest pain, fast or irregular heartbeat, dizziness, swelling of the ankles, feet, or hands Hormone gland problems--headache, sensitivity to light, unusual weakness or fatigue, dizziness, fast or irregular heartbeat, increased sensitivity to cold or heat, excessive sweating, constipation, hair loss, increased thirst or amount of urine, tremors or shaking, irritability Infusion reactions--chest pain, shortness of breath or trouble breathing, feeling faint or lightheaded Kidney injury (glomerulonephritis)--decrease in the amount of urine, red or dark brown urine, foamy or bubbly urine, swelling of the ankles, hands, or feet Liver injury--right upper belly pain, loss of appetite, nausea, light-colored stool, dark yellow or brown urine, yellowing skin or eyes, unusual weakness or fatigue Pain, tingling, or numbness in the hands or feet, muscle weakness, change in vision, confusion or trouble speaking, loss of balance or coordination, trouble walking, seizures Rash, fever, and  swollen lymph nodes Redness, blistering, peeling, or loosening of the skin, including inside the mouth Sudden or severe stomach pain, bloody diarrhea, fever, nausea, vomiting Side effects that usually do not require medical attention (report these to your care team if they continue or are bothersome): Bone, joint, or muscle pain Diarrhea Fatigue Loss of appetite Nausea Skin rash This list may not describe all possible side effects. Call your doctor for medical advice about side effects. You may report side effects to FDA at 1-800-FDA-1088. Where should I keep my medication? This medication is given in a hospital or clinic. It will not be stored at home. NOTE: This sheet is a summary. It may not cover all possible information. If you have questions about this medicine, talk to your doctor, pharmacist, or health care provider.  2024 Elsevier/Gold Standard (2021-07-29 00:00:00)   BELOW ARE SYMPTOMS THAT SHOULD BE REPORTED IMMEDIATELY: *FEVER GREATER THAN 100.4 F (38 C) OR HIGHER *CHILLS OR SWEATING *NAUSEA AND VOMITING THAT IS NOT CONTROLLED WITH YOUR NAUSEA MEDICATION *UNUSUAL SHORTNESS OF BREATH *UNUSUAL BRUISING OR BLEEDING *URINARY PROBLEMS (pain or burning when urinating, or frequent urination) *BOWEL PROBLEMS (unusual diarrhea, constipation, pain near the anus) TENDERNESS IN MOUTH AND THROAT WITH OR WITHOUT PRESENCE OF ULCERS (sore throat, sores in mouth, or a toothache) UNUSUAL RASH, SWELLING OR PAIN  UNUSUAL VAGINAL DISCHARGE OR ITCHING   Items with * indicate a  potential emergency and should be followed up as soon as possible or go to the Emergency Department if any problems should occur.  Please show the CHEMOTHERAPY ALERT CARD or IMMUNOTHERAPY ALERT CARD at check-in to the Emergency Department and triage nurse.  Should you have questions after your visit or need to cancel or reschedule your appointment, please contact [facility]  - A DEPT OF [facility] [phone]  and follow the prompts.  Office hours are 8:00 a.m. to 4:30 p.m. Monday - Friday. Please note that voicemails left after 4:00 p.m. may not be returned until the following business day.  We are closed weekends and major holidays. You have access to a nurse at all times for urgent questions. Please call the main number to the clinic [phone] and follow the prompts.  For any non-urgent questions, you may also contact your provider using MyChart. We now offer e-Visits for anyone 65 and older to request care online for non-urgent symptoms. For details visit [email or web address].   Also download the MyChart app! Go to the app store, search "MyChart", open the app, select [place], and log in with your MyChart username and password.

## 2023-03-15 NOTE — Progress Notes (Signed)
Patient presents today for Imfinzi infusion. Patient is in satisfactory condition with no new complaints voiced.  Vital signs are stable.  Labs reviewed by Dr. Ellin Saba during the office visit and all labs are within treatment parameters.  We will proceed with treatment per MD orders.   Treatment given today per MD orders. Tolerated infusion without adverse affects. Vital signs stable. No complaints at this time. Discharged from clinic ambulatory in stable condition. Alert and oriented x 3. F/U with [facility] as scheduled.

## 2023-03-15 NOTE — Patient Instructions (Signed)
[facility] at [facility] Discharge Instructions   You were seen and examined today by Dr. Ellin Saba.  He reviewed the results of your lab work which are normal/stable.   He reviewed the results of your CT scan. It is showing there are some changes from the previous scan, but Dr. Kirtland Bouchard does not think this looks like cancer. It looks more like inflammation. The tiny small spots that are new in the lower lung also do not appear to be cancer. We will just continue to monitor with scans.   We will proceed with your treatment today.   Return as scheduled.    Thank you for choosing [facility] at [facility] to provide your oncology and hematology care.  To afford each patient quality time with our provider, please arrive at least 15 minutes before your scheduled appointment time.   If you have a lab appointment with the Cancer Center please come in thru the Main Entrance and check in at the main information desk.  You need to re-schedule your appointment should you arrive 10 or more minutes late.  We strive to give you quality time with our providers, and arriving late affects you and other patients whose appointments are after yours.  Also, if you no show three or more times for appointments you may be dismissed from the clinic at the providers discretion.     Again, thank you for choosing [facility].  Our hope is that these requests will decrease the amount of time that you wait before being seen by our physicians.       _____________________________________________________________  Should you have questions after your visit to [facility], please contact our office at [phone] and follow the prompts.  Our office hours are 8:00 a.m. and 4:30 p.m. Monday - Friday.  Please note that voicemails left after 4:00 p.m. may not be returned until the following business day.  We are closed weekends and major holidays.  You do have  access to a nurse 24-7, just call the main number to the clinic [phone] and do not press any options, hold on the line and a nurse will answer the phone.    For prescription refill requests, have your pharmacy contact our office and allow 72 hours.    Due to Covid, you will need to wear a mask upon entering the hospital. If you do not have a mask, a mask will be given to you at the Main Entrance upon arrival. For doctor visits, patients may have 1 support person age 45 or older with them. For treatment visits, patients can not have anyone with them due to social distancing guidelines and our immunocompromised population.

## 2023-03-15 NOTE — Progress Notes (Signed)
[facility] [address], [place] [address]    Clinic Day:  03/15/2023  Referring physician: Ignatius Specking, MD  Patient Care Team: Ignatius Specking, MD as PCP - General (Internal Medicine) Pricilla Riffle, MD as PCP - Cardiology (Cardiology) Charna Elizabeth, MD as Consulting Physician (Gastroenterology) Doreatha Massed, MD as Medical Oncologist (Medical Oncology) Therese Sarah, RN as Oncology Nurse Navigator (Medical Oncology)   ASSESSMENT & PLAN:   Assessment: 1.  Recurrent extensive stage small cell lung cancer: - Patient seen at the request of Dr. Shirline Frees to get care closer to home. - 4 cycles of carboplatin and etoposide with concurrent radiation therapy for limited stage small cell lung cancer on 11/14/2009 - Status post PCI completed on 01/28/2010 - Wedge resection of the RUL by Dr. Lavinia Sharps on 09/20/2013, pathology consistent with small cell lung cancer. - 4 cycles of carboplatin and etoposide from 10/30/2013 through 01/08/2014 - PET scan on 04/23/2012: Marked hypermetabolism in the medial and anterior aspect of the left upper lobe.  Several hypermetabolic lung nodules in the left lung.  9 mm lesion in the left lower lobe.  No metastatic disease in the AP. - Bronchoscopy and FNA of the LUL nodule and LUL brushing and lavage with no malignant cells. - CT chest (08/03/2022): Scattered new nodular densities in the lungs bilaterally, largest in the left lower lobe.  New subcentimeter hypodense lesion in the left hepatic lobe 7 mm.  Periportal adenopathy 2.3 x 2.6 cm. - MRI of brain results from 08/14/2022: No metastatic disease.  Progression of small vessel ischemic disease. - PET scan from 08/13/2022 showed radiation changes in left upper lobe with focal hypermetabolism suggestive of residual recurrent tumor although this is improved from prior PET.  Dominant 8 mm subpleural nodule in the left lower lobe unchanged from recent CT and new from prior PET.  Metastatic  disease not excluded although infection/inflammation is also possible.  Prior hypermetabolic nodules on PET have resolved.  No evidence of metastatic disease in the abdomen or pelvis. - Cycle 1 of carboplatin/etoposide/durvalumab on 08/17/2022, cycle 4 on 11/02/2022.  Maintenance durvalumab started on 11/23/2022.   2.  Social/family history: - She moved back to [place] recently and lives with her husband.  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  She also worked at [employer] for 15 years.  Quit smoking in 2010 and smoked 1 pack/day for 30 years. - Mother had brain tumor.  Maternal aunt had breast cancer.    Plan: 1.  Recurrent extensive stage small cell lung cancer: - She is tolerating durvalumab reasonably well. - Reviewed CT CAP from 03/09/2023: Left upper lobe increased groundglass and septal thickening in the apex.  Differential includes pneumonitis/superimposed infection.  Diffuse pulmonary nodularity including new bilateral lower lobe lung nodules.  No evidence of metastatic disease in the abdomen or pelvis. - She is completely asymptomatic.  No dyspnea on exertion or cough with expectoration. - Recommend continue durvalumab every 4 weeks.  RTC 8 weeks for follow-up with repeat CT of the chest with contrast.   2.  Gastroparesis: - Continue follow-up with GI.   3.  Hypomagnesemia: - Continue magnesium daily.  Magnesium is 1.6 today.   4.  Skin rash: - Previous rash on the legs and dorsum of hands resolved. - Use triamcinolone cream twice daily as needed.    Orders Placed This Encounter  Procedures   CT CHEST W CONTRAST  Standing Status:   Future    Expected Date:   05/10/2023    Expiration Date:   03/14/2024    If indicated for the ordered procedure, I authorize the administration of contrast media per Radiology protocol:   Yes    Does the patient have a contrast media/X-ray dye allergy?:   No    Preferred imaging location?:   [facility]     Release to patient:   Immediate   Magnesium    Standing Status:   Future    Expected Date:   05/10/2023    Expiration Date:   05/09/2024   CBC with Differential    Standing Status:   Future    Expected Date:   05/10/2023    Expiration Date:   05/09/2024   Comprehensive metabolic panel    Standing Status:   Future    Expected Date:   05/10/2023    Expiration Date:   05/09/2024   Magnesium    Standing Status:   Future    Expected Date:   06/07/2023    Expiration Date:   06/06/2024   CBC with Differential    Standing Status:   Future    Expected Date:   06/07/2023    Expiration Date:   06/06/2024   Comprehensive metabolic panel    Standing Status:   Future    Expected Date:   06/07/2023    Expiration Date:   06/06/2024   Iron and TIBC ([facility] ONLY)    Standing Status:   Future    Expected Date:   04/12/2023    Expiration Date:   03/14/2024   Ferritin    Standing Status:   Future    Expected Date:   04/12/2023    Expiration Date:   03/14/2024    Mikeal Hawthorne R Teague,acting as a scribe for Doreatha Massed, MD.,have documented all relevant documentation on the behalf of Doreatha Massed, MD,as directed by  Doreatha Massed, MD while in the presence of Doreatha Massed, MD.  I, Doreatha Massed MD, have reviewed the above documentation for accuracy and completeness, and I agree with the above.     Doreatha Massed, MD   12/16/20241:39 PM  CHIEF COMPLAINT:   Diagnosis: recurrent small cell lung cancer    Cancer Staging  Small cell lung cancer (HCC) Staging form: Lung, AJCC 7th Edition - Clinical: Limited Stage Small cell Lung Cancer - Signed by Si Gaul, MD on 05/10/2013    Prior Therapy: 1. Concurrent chemoradiation with carboplatin and etoposide, completed 11/14/2009.  2. Prophylactic cranial irradiation, completed 01/28/2010. 3. RUL wedge resection on 09/21/2013 (Dr. Laneta Simmers) 4. 4 cycles of carboplatin and etoposide, 10/30/2013 -  01/10/2014  Current Therapy:  Carboplatin, etoposide and durvalumab    HISTORY OF PRESENT ILLNESS:   Oncology History  Small cell lung cancer (HCC)  04/10/2011 Initial Diagnosis   Small cell lung cancer (HCC)   08/17/2022 -  Chemotherapy   Patient is on Treatment Plan : LUNG SMALL CELL EXTENSIVE STAGE Durvalumab + Carboplatin D1 + Etoposide D1-3 q21d x 4 Cycles / Durvalumab q28d        INTERVAL HISTORY:   Claudia [name] is a 77 y.o. female seen for follow-up of recurrent small cell lung cancer.  She was last seen by me on 01/18/23.  Since her last visit, she underwent CT C/A/P on 01/18/23 that found: worsened left upper lobe/apical aeration since 10/21/2022; increased ground-glass and septal thickening in the apex with progressive posterior left upper lobe collapse/consolidation,  considerations include progression of radiation change and/or superimposed infection; diffuse pulmonary nodularity, including new bilateral lower lobe pulmonary nodules that could be related to chronic aspiration though metastasis cannot be excluded; markedly dilated esophagus with food/debris within, most consistent with severe dysmotility; and no acute process or evidence of metastatic disease in the abdomen or pelvis.  Today, she states that she is doing well overall. Her appetite level is at 80%. Her energy level is at 90%.   PAST MEDICAL HISTORY:   Past Medical History: Past Medical History:  Diagnosis Date   Achalasia    Anemia    Arthritis    Bradycardia    mild,may be due to beta blocker therapy   COPD (chronic obstructive pulmonary disease) (HCC)    Gastroparesis    GERD (gastroesophageal reflux disease)    otc   Hypertension 06/01/2019   pt denies   Local recurrence of lung cancer (HCC) dx'd 07/2013   rt thoracotomy chemo comp 12/2013   Lung cancer (HCC) 03/30/2008   Dr. Arbutus Ped, finished chemo, sp radiation, left upper    Lymphocytic colitis    Pneumonia     Surgical History: Past Surgical  History:  Procedure Laterality Date   BRONCHIAL BIOPSY  05/28/2022   Procedure: BRONCHIAL BIOPSIES;  Surgeon: Omar Person, MD;  Location: [facility] ENDOSCOPY;  Service: Pulmonary;;   BRONCHIAL BRUSHINGS  05/28/2022   Procedure: BRONCHIAL BRUSHINGS;  Surgeon: Omar Person, MD;  Location: [facility] ENDOSCOPY;  Service: Pulmonary;;   BRONCHIAL NEEDLE ASPIRATION BIOPSY  05/28/2022   Procedure: BRONCHIAL NEEDLE ASPIRATION BIOPSIES;  Surgeon: Omar Person, MD;  Location: [facility] ENDOSCOPY;  Service: Pulmonary;;   BRONCHIAL WASHINGS  05/28/2022   Procedure: BRONCHIAL WASHINGS;  Surgeon: Omar Person, MD;  Location: [facility] ENDOSCOPY;  Service: Pulmonary;;   BRONCHOSCOPY  2011   DILATION AND CURETTAGE OF UTERUS     ESOPHAGOGASTRODUODENOSCOPY (EGD) WITH PROPOFOL N/A 07/29/2021   Procedure: ESOPHAGOGASTRODUODENOSCOPY (EGD) WITH PROPOFOL;  Surgeon: Dolores Frame, MD;  Location: [facility] ENDO SUITE;  Service: Gastroenterology;  Laterality: N/A;  1235 ASA 2   HEMOSTASIS CONTROL  05/28/2022   Procedure: HEMOSTASIS CONTROL;  Surgeon: Omar Person, MD;  Location: [facility] ENDOSCOPY;  Service: Pulmonary;;   IR IMAGING GUIDED PORT INSERTION  08/12/2022   NECK SURGERY  1980's   THORACOTOMY Right 09/21/2013   Procedure: THORACOTOMY MAJOR;  Surgeon: Alleen Borne, MD;  Location: [facility] OR;  Service: Thoracic;  Laterality: Right;   UTERINE FIBROID SURGERY  2012   WEDGE RESECTION Right 09/21/2013   Procedure: RIGHT UPPER LOBE WEDGE RESECTION;  Surgeon: Alleen Borne, MD;  Location: [facility] OR;  Service: Thoracic;  Laterality: Right;    Social History: Social History   Socioeconomic History   Marital status: Married    Spouse name: Not on file   Number of children: Not on file   Years of education: Not on file   Highest education level: Not on file  Occupational History   Occupation: clerical    Employer: [employer]  Tobacco Use   Smoking status: Former    Current packs/day: 0.00    Average packs/day: 1  pack/day for 35.0 years (35.0 ttl pk-yrs)    Types: Cigarettes    Start date: 03/30/1972    Quit date: 03/31/2007    Years since quitting: 15.9    Passive exposure: Past   Smokeless tobacco: Never  Vaping Use   Vaping status: Never Used  Substance and Sexual Activity   Alcohol use:  Yes    Comment: occassional about 3 drinks per year   Drug use: No   Sexual activity: Never  Other Topics Concern   Not on file  Social History Narrative   Not on file   Social Drivers of Health   Financial Resource Strain: Not on file  Food Insecurity: No Food Insecurity (08/06/2022)   Hunger Vital Sign    Worried About Running Out of Food in the Last Year: Never true    Ran Out of Food in the Last Year: Never true  Transportation Needs: No Transportation Needs (08/06/2022)   PRAPARE - Administrator, Civil Service (Medical): No    Lack of Transportation (Non-Medical): No  Physical Activity: Not on file  Stress: Not on file  Social Connections: Not on file  Intimate Partner Violence: Not At Risk (08/06/2022)   Humiliation, Afraid, Rape, and Kick questionnaire    Fear of Current or Ex-Partner: No    Emotionally Abused: No    Physically Abused: No    Sexually Abused: No    Family History: Family History  Problem Relation Age of Onset   Brain cancer Mother        brain cancer   Emphysema Father    Diabetes Son    Heart disease Paternal Grandfather    Breast cancer Maternal Aunt     Current Medications:  Current Outpatient Medications:    bacitracin-polymyxin b (POLYSPORIN) ophthalmic ointment, SMARTSIG:0.25 Inch(es) In Eye(s) 4 Times Daily, Disp: , Rfl:    Calcium Carbonate Antacid (TUMS PO), Take 500-1,000 mg by mouth 3 (three) times daily as needed (indigestion/heartburn.)., Disp: , Rfl:    lidocaine-prilocaine (EMLA) cream, Apply 1 Application topically as needed., Disp: 30 g, Rfl: 1   lidocaine-prilocaine (EMLA) cream, Apply to affected area once, Disp: 30 g, Rfl: 3   magnesium  oxide (MAG-OX) 400 (240 Mg) MG tablet, Take 1 tablet (400 mg total) by mouth daily., Disp: 60 tablet, Rfl: 2   metoCLOPramide (REGLAN) 5 MG tablet, Take 1 tablet (5 mg total) by mouth 3 (three) times daily before meals., Disp: 180 tablet, Rfl: 3   tretinoin (RETIN-A) 0.1 % cream, Apply 1 application  topically at bedtime., Disp: , Rfl:    Allergies: Allergies  Allergen Reactions   Azithromycin Shortness Of Breath and Rash    Took Tussionex at same time Jan 2013.   Tussionex Pennkinetic Er [Hydrocod Poli-Chlorphe Poli Er] Shortness Of Breath and Rash    Took Azithromycin at same time Jan 2013   Omeprazole Rash    REVIEW OF SYSTEMS:   Review of Systems  Constitutional:  Negative for chills, fatigue and fever.  HENT:   Negative for lump/mass, mouth sores, nosebleeds, sore throat and trouble swallowing.   Eyes:  Negative for eye problems.  Respiratory:  Negative for cough and shortness of breath.   Cardiovascular:  Negative for chest pain, leg swelling and palpitations.  Gastrointestinal:  Positive for constipation. Negative for abdominal pain, diarrhea, nausea and vomiting.  Genitourinary:  Negative for bladder incontinence, difficulty urinating, dysuria, frequency, hematuria and nocturia.   Musculoskeletal:  Negative for arthralgias, back pain, flank pain, myalgias and neck pain.  Skin:  Negative for itching and rash.  Neurological:  Negative for dizziness, headaches and numbness.  Hematological:  Does not bruise/bleed easily.  Psychiatric/Behavioral:  Negative for depression, sleep disturbance and suicidal ideas. The patient is not nervous/anxious.   All other systems reviewed and are negative.    VITALS:   Blood  pressure 134/70, pulse 93, temperature 98 F (36.7 C), temperature source Oral, resp. rate 17, weight 86 lb 6.4 oz (39.2 kg), SpO2 97%.  Wt Readings from Last 3 Encounters:  03/15/23 86 lb 6.4 oz (39.2 kg)  02/15/23 86 lb 6.4 oz (39.2 kg)  01/18/23 89 lb 3.2 oz (40.5  kg)    Body mass index is 15.8 kg/m.  Performance status (ECOG): 1 - Symptomatic but completely ambulatory  PHYSICAL EXAM:   Physical Exam Vitals and nursing note reviewed. Exam conducted with a chaperone present.  Constitutional:      Appearance: Normal appearance.  Cardiovascular:     Rate and Rhythm: Normal rate and regular rhythm.     Pulses: Normal pulses.     Heart sounds: Normal heart sounds.  Pulmonary:     Effort: Pulmonary effort is normal.     Breath sounds: Normal breath sounds.  Abdominal:     Palpations: Abdomen is soft. There is no hepatomegaly, splenomegaly or mass.     Tenderness: There is no abdominal tenderness.  Musculoskeletal:     Right lower leg: No edema.     Left lower leg: No edema.  Lymphadenopathy:     Cervical: No cervical adenopathy.     Right cervical: No superficial, deep or posterior cervical adenopathy.    Left cervical: No superficial, deep or posterior cervical adenopathy.     Upper Body:     Right upper body: No supraclavicular or axillary adenopathy.     Left upper body: No supraclavicular or axillary adenopathy.  Neurological:     General: No focal deficit present.     Mental Status: She is alert and oriented to person, place, and time.  Psychiatric:        Mood and Affect: Mood normal.        Behavior: Behavior normal.     LABS:      Latest Ref Rng & Units 03/09/2023   11:10 AM 02/15/2023    9:25 AM 01/18/2023   10:28 AM  CBC  WBC 4.0 - 10.5 K/uL 6.5  2.2  2.8   Hemoglobin 12.0 - 15.0 g/dL 16.1  09.6  04.5   Hematocrit 36.0 - 46.0 % 32.1  32.1  30.7   Platelets 150 - 400 K/uL 102  87  67       Latest Ref Rng & Units 03/09/2023   11:10 AM 02/15/2023    9:25 AM 01/18/2023   10:28 AM  CMP  Glucose 70 - 99 mg/dL 94  409  99   BUN 8 - 23 mg/dL 15  15  19    Creatinine 0.44 - 1.00 mg/dL 8.11  9.14  7.82   Sodium 135 - 145 mmol/L 138  136  136   Potassium 3.5 - 5.1 mmol/L 3.6  3.7  3.8   Chloride 98 - 111 mmol/L 103   101  103   CO2 22 - 32 mmol/L 26  28  28    Calcium 8.9 - 10.3 mg/dL 9.8  9.8  9.9   Total Protein 6.5 - 8.1 g/dL 6.5  6.6  6.6   Total Bilirubin <1.2 mg/dL 0.6  0.9  0.7   Alkaline Phos 38 - 126 U/L 65  56  54   AST 15 - 41 U/L 14  16  17    ALT 0 - 44 U/L 8  9  10       No results found for: "CEA1", "CEA" / No results found for: "CEA1", "CEA"  No results found for: "PSA1" No results found for: "QIO962" No results found for: "CAN125"  No results found for: "TOTALPROTELP", "ALBUMINELP", "A1GS", "A2GS", "BETS", "BETA2SER", "GAMS", "MSPIKE", "SPEI" Lab Results  Component Value Date   TIBC 238 (L) 08/17/2022   TIBC 149 (L) 10/08/2021   TIBC 159 (L) 04/10/2011   FERRITIN 296 08/17/2022   FERRITIN 675 (H) 10/08/2021   FERRITIN 1,680 (H) 04/10/2011   IRONPCTSAT 21 08/17/2022   IRONPCTSAT 9 (L) 10/08/2021   IRONPCTSAT 18 (L) 04/10/2011   No results found for: "LDH"   STUDIES:   CT CHEST ABDOMEN PELVIS W CONTRAST Result Date: 03/15/2023 CLINICAL DATA:  Right upper lobe small-cell carcinoma. Restaging. * Tracking Code: BO * EXAM: CT CHEST, ABDOMEN, AND PELVIS WITH CONTRAST TECHNIQUE: Multidetector CT imaging of the chest, abdomen and pelvis was performed following the standard protocol during bolus administration of intravenous contrast. RADIATION DOSE REDUCTION: This exam was performed according to the departmental dose-optimization program which includes automated exposure control, adjustment of the mA and/or kV according to patient size and/or use of iterative reconstruction technique. CONTRAST:  75mL OMNIPAQUE IOHEXOL 300 MG/ML  SOLN COMPARISON:  Chest CT 10/21/2022.  PET 08/13/2022. FINDINGS: CT CHEST FINDINGS Cardiovascular: Right Port-A-Cath terminates at the high right atrium. Aortic atherosclerosis. Normal heart size, without pericardial effusion. No central pulmonary embolism, on this non-dedicated study. Mediastinum/Nodes: No mediastinal or hilar adenopathy. Again identified is a  markedly dilated, food/debris-filled esophagus. Lungs/Pleura: No pleural fluid. Volume loss in the left hemithorax with tracheal deviation to the left. Moderate centrilobular emphysema. 3 mm right upper lobe pulmonary nodule is similar on 52/3. Surgical sutures from right upper lobe wedge resection. 4 mm right lower lobe pulmonary nodule on 87/3 is similar to 5 mm on the prior. A pleural-based right lower lobe 3 mm nodule on 79/3 is not readily apparent previously. More medial right lower lobe pulmonary nodule is less distinct today at 5 mm on 64/3. The right upper lobe pulmonary nodule on the prior exam has resolved. Medial left upper lobe/apical aeration is worsened. Septal thickening and ground-glass extend more superiorly into the apex today. The consolidation within the medial left upper lobe extends more posteriorly today. The subtending left upper lobe bronchus is compressed or collapsed including on 52/3, increased. Mild left lower lobe nodularity again identified. The largest nodule measures 6 mm on 68/3 and is new or enlarged since the prior. Musculoskeletal: Included within the abdomen pelvic section. CT ABDOMEN PELVIS FINDINGS Hepatobiliary: 2 mm too small to characterize segment 3 liver lesion. No suspicious liver lesion or biliary abnormality. Pancreas: Normal, without mass or ductal dilatation. Spleen: Normal in size, without focal abnormality. Adrenals/Urinary Tract: Normal adrenal glands. Left larger than right low-density well-circumscribed renal lesions are likely cysts and minimally complex cysts. No specific follow-up indicated. No hydronephrosis. Normal urinary bladder. Stomach/Bowel: Normal stomach, without wall thickening. Normal colon and terminal ileum. Normal small bowel. Vascular/Lymphatic: Advanced aortic and branch vessel atherosclerosis. No abdominopelvic adenopathy. Reproductive: Suspected uterine fundal 12 mm fibroid on 97/2. No adnexal mass. Other: No significant free fluid.  Moderate pelvic floor laxity. No free intraperitoneal air. No evidence of omental or peritoneal disease. Musculoskeletal: Subcutaneous lipoma about the lateral right abdominal wall including a 1.3 x 2.7 cm on 62/2. Lumbar spondylosis. IMPRESSION: 1. Worsened left upper lobe/apical aeration since 10/21/2022. Increased ground-glass and septal thickening in the apex with progressive posterior left upper lobe collapse/consolidation. Considerations include progression of radiation change and/or superimposed infection. Not a typical appearance of local recurrence,  which cannot be entirely excluded. Depending on clinical concern, short-term follow-up CT or PET could be performed. 2. Diffuse pulmonary nodularity, including new bilateral lower lobe pulmonary nodules as detailed above. Given the appearance of the esophagus, this could be related to chronic aspiration. Metastasis cannot be excluded. 3. Markedly dilated esophagus with food/debris within, most consistent with severe dysmotility. 4. No acute process or evidence of metastatic disease in the abdomen or pelvis. 5. Incidental findings, including: Aortic atherosclerosis (ICD10-I70.0), coronary artery atherosclerosis and emphysema (ICD10-J43.9). Small uterine fundal fibroid. Electronically Signed   By: Jeronimo Greaves M.D.   On: 03/15/2023 09:23

## 2023-03-29 ENCOUNTER — Encounter: Payer: Self-pay | Admitting: Hematology

## 2023-04-04 ENCOUNTER — Other Ambulatory Visit: Payer: Self-pay | Admitting: Hematology

## 2023-04-05 ENCOUNTER — Encounter: Payer: Self-pay | Admitting: Hematology

## 2023-04-12 ENCOUNTER — Inpatient Hospital Stay: Payer: Medicare PPO

## 2023-04-12 ENCOUNTER — Inpatient Hospital Stay: Payer: Medicare PPO | Admitting: Hematology

## 2023-04-13 ENCOUNTER — Inpatient Hospital Stay: Payer: Medicare PPO | Attending: Nurse Practitioner

## 2023-04-13 ENCOUNTER — Encounter: Payer: Self-pay | Admitting: Hematology

## 2023-04-13 ENCOUNTER — Inpatient Hospital Stay: Payer: Medicare PPO

## 2023-04-13 VITALS — BP 132/62 | HR 78 | Temp 97.4°F | Resp 18 | Wt 85.8 lb

## 2023-04-13 DIAGNOSIS — C3411 Malignant neoplasm of upper lobe, right bronchus or lung: Secondary | ICD-10-CM | POA: Insufficient documentation

## 2023-04-13 DIAGNOSIS — Z79899 Other long term (current) drug therapy: Secondary | ICD-10-CM | POA: Insufficient documentation

## 2023-04-13 DIAGNOSIS — Z5112 Encounter for antineoplastic immunotherapy: Secondary | ICD-10-CM | POA: Diagnosis not present

## 2023-04-13 LAB — CBC WITH DIFFERENTIAL/PLATELET
Abs Immature Granulocytes: 0.04 10*3/uL (ref 0.00–0.07)
Basophils Absolute: 0 10*3/uL (ref 0.0–0.1)
Basophils Relative: 1 %
Eosinophils Absolute: 0 10*3/uL (ref 0.0–0.5)
Eosinophils Relative: 1 %
HCT: 31.3 % — ABNORMAL LOW (ref 36.0–46.0)
Hemoglobin: 10.4 g/dL — ABNORMAL LOW (ref 12.0–15.0)
Immature Granulocytes: 1 %
Lymphocytes Relative: 8 %
Lymphs Abs: 0.3 10*3/uL — ABNORMAL LOW (ref 0.7–4.0)
MCH: 33.3 pg (ref 26.0–34.0)
MCHC: 33.2 g/dL (ref 30.0–36.0)
MCV: 100.3 fL — ABNORMAL HIGH (ref 80.0–100.0)
Monocytes Absolute: 0.6 10*3/uL (ref 0.1–1.0)
Monocytes Relative: 15 %
Neutro Abs: 3 10*3/uL (ref 1.7–7.7)
Neutrophils Relative %: 74 %
Platelets: 97 10*3/uL — ABNORMAL LOW (ref 150–400)
RBC: 3.12 MIL/uL — ABNORMAL LOW (ref 3.87–5.11)
RDW: 14.6 % (ref 11.5–15.5)
WBC: 4 10*3/uL (ref 4.0–10.5)
nRBC: 0 % (ref 0.0–0.2)

## 2023-04-13 LAB — COMPREHENSIVE METABOLIC PANEL
ALT: 8 U/L (ref 0–44)
AST: 15 U/L (ref 15–41)
Albumin: 3.5 g/dL (ref 3.5–5.0)
Alkaline Phosphatase: 63 U/L (ref 38–126)
Anion gap: 6 (ref 5–15)
BUN: 17 mg/dL (ref 8–23)
CO2: 26 mmol/L (ref 22–32)
Calcium: 9.6 mg/dL (ref 8.9–10.3)
Chloride: 105 mmol/L (ref 98–111)
Creatinine, Ser: 0.77 mg/dL (ref 0.44–1.00)
GFR, Estimated: >60 mL/min (ref >60)
Glucose, Bld: 155 mg/dL — ABNORMAL HIGH (ref 70–99)
Potassium: 3.8 mmol/L (ref 3.5–5.1)
Sodium: 137 mmol/L (ref 135–145)
Total Bilirubin: 1 mg/dL (ref 0.0–1.2)
Total Protein: 6.4 g/dL — ABNORMAL LOW (ref 6.5–8.1)

## 2023-04-13 LAB — MAGNESIUM: Magnesium: 1.6 mg/dL — ABNORMAL LOW (ref 1.7–2.4)

## 2023-04-13 MED ORDER — SODIUM CHLORIDE 0.9 % IV SOLN
1500.0000 mg | Freq: Once | INTRAVENOUS | Status: AC
  Administered 2023-04-13: 1500 mg via INTRAVENOUS
  Filled 2023-04-13: qty 30

## 2023-04-13 MED ORDER — SODIUM CHLORIDE 0.9% FLUSH
10.0000 mL | INTRAVENOUS | Status: DC | PRN
  Administered 2023-04-13: 10 mL

## 2023-04-13 MED ORDER — HEPARIN SOD (PORK) LOCK FLUSH 100 UNIT/ML IV SOLN
500.0000 [IU] | Freq: Once | INTRAVENOUS | Status: AC | PRN
  Administered 2023-04-13: 500 [IU]

## 2023-04-13 MED ORDER — MAGNESIUM SULFATE 2 GM/50ML IV SOLN
2.0000 g | Freq: Once | INTRAVENOUS | Status: AC
  Administered 2023-04-13: 2 g via INTRAVENOUS
  Filled 2023-04-13: qty 50

## 2023-04-13 MED ORDER — SODIUM CHLORIDE 0.9 % IV SOLN: Freq: Once | INTRAVENOUS | Status: AC

## 2023-04-13 NOTE — Patient Instructions (Signed)
 [facility] - A DEPT OF [facility]  Discharge Instructions: Thank you for choosing [facility] to provide your oncology and hematology care.  If you have a lab appointment with the Cancer Center - please note that after April 8th, 2024, all labs will be drawn in the cancer center.  You do not have to check in or register with the main entrance as you have in the past but will complete your check-in in the cancer center.  Wear comfortable clothing and clothing appropriate for easy access to any Portacath or PICC line.   We strive to give you quality time with your provider. You may need to reschedule your appointment if you arrive late (15 or more minutes).  Arriving late affects you and other patients whose appointments are after yours.  Also, if you miss three or more appointments without notifying the office, you may be dismissed from the clinic at the provider's discretion.      For prescription refill requests, have your pharmacy contact our office and allow 72 hours for refills to be completed.    Today you received the following chemotherapy and/or immunotherapy agents Imfinzi.  Durvalumab Injection What is this medication? DURVALUMAB (dur VAL ue mab) treats some types of cancer. It works by helping your immune system slow or stop the spread of cancer cells. It is a monoclonal antibody. This medicine may be used for other purposes; ask your health care provider or pharmacist if you have questions. COMMON BRAND NAME(S): IMFINZI What should I tell my care team before I take this medication? They need to know if you have any of these conditions: Allogeneic stem cell transplant (uses someone else's stem cells) Autoimmune diseases, such as Crohn disease, ulcerative colitis, lupus History of chest radiation Nervous system problems, such as Guillain-Barre syndrome, myasthenia gravis Organ transplant An unusual or allergic reaction to durvalumab,  other medications, foods, dyes, or preservatives Pregnant or trying to get pregnant Breast-feeding How should I use this medication? This medication is infused into a vein. It is given by your care team in a hospital or clinic setting. A special MedGuide will be given to you before each treatment. Be sure to read this information carefully each time. Talk to your care team about the use of this medication in children. Special care may be needed. Overdosage: If you think you have taken too much of this medicine contact a poison control center or emergency room at once. NOTE: This medicine is only for you. Do not share this medicine with others. What if I miss a dose? Keep appointments for follow-up doses. It is important not to miss your dose. Call your care team if you are unable to keep an appointment. What may interact with this medication? Interactions have not been studied. This list may not describe all possible interactions. Give your health care provider a list of all the medicines, herbs, non-prescription drugs, or dietary supplements you use. Also tell them if you smoke, drink alcohol, or use illegal drugs. Some items may interact with your medicine. What should I watch for while using this medication? Your condition will be monitored carefully while you are receiving this medication. You may need blood work while taking this medication. This medication may cause serious skin reactions. They can happen weeks to months after starting the medication. Contact your care team right away if you notice fevers or flu-like symptoms with a rash. The rash may be red or  purple and then turn into blisters or peeling of the skin. You may also notice a red rash with swelling of the face, lips, or lymph nodes in your neck or under your arms. Tell your care team right away if you have any change in your eyesight. Talk to your care team if you may be pregnant. Serious birth defects can occur if you take  this medication during pregnancy and for 3 months after the last dose. You will need a negative pregnancy test before starting this medication. Contraception is recommended while taking this medication and for 3 months after the last dose. Your care team can help you find the option that works for you. Do not breastfeed while taking this medication and for 3 months after the last dose. What side effects may I notice from receiving this medication? Side effects that you should report to your care team as soon as possible: Allergic reactions--skin rash, itching, hives, swelling of the face, lips, tongue, or throat Dry cough, shortness of breath or trouble breathing Eye pain, redness, irritation, or discharge with blurry or decreased vision Heart muscle inflammation--unusual weakness or fatigue, shortness of breath, chest pain, fast or irregular heartbeat, dizziness, swelling of the ankles, feet, or hands Hormone gland problems--headache, sensitivity to light, unusual weakness or fatigue, dizziness, fast or irregular heartbeat, increased sensitivity to cold or heat, excessive sweating, constipation, hair loss, increased thirst or amount of urine, tremors or shaking, irritability Infusion reactions--chest pain, shortness of breath or trouble breathing, feeling faint or lightheaded Kidney injury (glomerulonephritis)--decrease in the amount of urine, red or dark brown urine, foamy or bubbly urine, swelling of the ankles, hands, or feet Liver injury--right upper belly pain, loss of appetite, nausea, light-colored stool, dark yellow or brown urine, yellowing skin or eyes, unusual weakness or fatigue Pain, tingling, or numbness in the hands or feet, muscle weakness, change in vision, confusion or trouble speaking, loss of balance or coordination, trouble walking, seizures Rash, fever, and swollen lymph nodes Redness, blistering, peeling, or loosening of the skin, including inside the mouth Sudden or severe  stomach pain, bloody diarrhea, fever, nausea, vomiting Side effects that usually do not require medical attention (report these to your care team if they continue or are bothersome): Bone, joint, or muscle pain Diarrhea Fatigue Loss of appetite Nausea Skin rash This list may not describe all possible side effects. Call your doctor for medical advice about side effects. You may report side effects to FDA at 1-800-FDA-1088. Where should I keep my medication? This medication is given in a hospital or clinic. It will not be stored at home. NOTE: This sheet is a summary. It may not cover all possible information. If you have questions about this medicine, talk to your doctor, pharmacist, or health care provider.  2024 Elsevier/Gold Standard (2021-07-29 00:00:00)        To help prevent nausea and vomiting after your treatment, we encourage you to take your nausea medication as directed.  BELOW ARE SYMPTOMS THAT SHOULD BE REPORTED IMMEDIATELY: *FEVER GREATER THAN 100.4 F (38 C) OR HIGHER *CHILLS OR SWEATING *NAUSEA AND VOMITING THAT IS NOT CONTROLLED WITH YOUR NAUSEA MEDICATION *UNUSUAL SHORTNESS OF BREATH *UNUSUAL BRUISING OR BLEEDING *URINARY PROBLEMS (pain or burning when urinating, or frequent urination) *BOWEL PROBLEMS (unusual diarrhea, constipation, pain near the anus) TENDERNESS IN MOUTH AND THROAT WITH OR WITHOUT PRESENCE OF ULCERS (sore throat, sores in mouth, or a toothache) UNUSUAL RASH, SWELLING OR PAIN  UNUSUAL VAGINAL DISCHARGE OR ITCHING   Items  with * indicate a potential emergency and should be followed up as soon as possible or go to the Emergency Department if any problems should occur.  Please show the CHEMOTHERAPY ALERT CARD or IMMUNOTHERAPY ALERT CARD at check-in to the Emergency Department and triage nurse.  Should you have questions after your visit or need to cancel or reschedule your appointment, please contact [facility] [place] - A DEPT OF Eligh[facility] Br[facility] [phone]  and follow the prompts.  Office hours are 8:00 a.m. to 4:30 p.m. Monday - Friday. Please note that voicemails left after 4:00 p.m. may not be returned until the following business day.  We are closed weekends and major holidays. You have access to a nurse at all times for urgent questions. Please call the main number to the clinic [phone] and follow the prompts.  For any non-urgent questions, you may also contact your provider using MyChart. We now offer e-Visits for anyone 68 and older to request care online for non-urgent symptoms. For details visit [email or web address].   Also download the MyChart app! Go to the app store, search "MyChart", open the app, select [place], and log in with your MyChart username and password.

## 2023-04-13 NOTE — Progress Notes (Signed)
 TSH with next cycle.  Pryor Ochoa, PharmD

## 2023-04-13 NOTE — Progress Notes (Signed)
 Platelets 97 today for Imfinzi treatment. Ok to treat Dr. Ellin Saba.  Magnesium 1.6 and to receive Magnesium 2 grams per standing orders Dr. Ellin Saba.  Ok to run Magnesium over 30 minutes per pharmacy.

## 2023-04-13 NOTE — Progress Notes (Signed)
 Patient tolerated treatment well with no complaints voiced.  Patient left ambulatory in stable condition.  Vital signs stable at discharge.  Follow up as scheduled.

## 2023-04-15 DIAGNOSIS — K224 Dyskinesia of esophagus: Secondary | ICD-10-CM | POA: Diagnosis not present

## 2023-04-15 DIAGNOSIS — C3490 Malignant neoplasm of unspecified part of unspecified bronchus or lung: Secondary | ICD-10-CM | POA: Diagnosis not present

## 2023-04-15 DIAGNOSIS — K3184 Gastroparesis: Secondary | ICD-10-CM | POA: Diagnosis not present

## 2023-04-15 DIAGNOSIS — R6881 Early satiety: Secondary | ICD-10-CM | POA: Diagnosis not present

## 2023-05-03 ENCOUNTER — Ambulatory Visit (HOSPITAL_COMMUNITY)
Admission: RE | Admit: 2023-05-03 | Discharge: 2023-05-03 | Disposition: A | Discharge status: Home / Self Care | Payer: Medicare PPO | Source: Ambulatory Visit | Attending: Hematology | Admitting: Hematology

## 2023-05-03 ENCOUNTER — Encounter (HOSPITAL_COMMUNITY): Payer: Self-pay | Admitting: Radiology

## 2023-05-03 DIAGNOSIS — C3411 Malignant neoplasm of upper lobe, right bronchus or lung: Secondary | ICD-10-CM | POA: Insufficient documentation

## 2023-05-03 DIAGNOSIS — C3432 Malignant neoplasm of lower lobe, left bronchus or lung: Secondary | ICD-10-CM | POA: Diagnosis not present

## 2023-05-03 DIAGNOSIS — K220 Achalasia of cardia: Secondary | ICD-10-CM | POA: Diagnosis not present

## 2023-05-03 DIAGNOSIS — I700 Atherosclerosis of aorta: Secondary | ICD-10-CM | POA: Diagnosis not present

## 2023-05-03 MED ORDER — IOHEXOL 300 MG/ML  SOLN
75.0000 mL | Freq: Once | INTRAMUSCULAR | Status: AC | PRN
  Administered 2023-05-03: 75 mL via INTRAVENOUS

## 2023-05-10 ENCOUNTER — Inpatient Hospital Stay: Payer: Medicare PPO

## 2023-05-10 ENCOUNTER — Inpatient Hospital Stay: Payer: Medicare PPO | Attending: Nurse Practitioner | Admitting: Hematology

## 2023-05-10 VITALS — BP 144/66 | HR 89 | Temp 97.8°F | Resp 18 | Wt 84.9 lb

## 2023-05-10 VITALS — BP 114/50 | HR 82 | Temp 96.9°F | Resp 18

## 2023-05-10 DIAGNOSIS — Z79620 Long term (current) use of immunosuppressive biologic: Secondary | ICD-10-CM | POA: Insufficient documentation

## 2023-05-10 DIAGNOSIS — Z5112 Encounter for antineoplastic immunotherapy: Secondary | ICD-10-CM | POA: Diagnosis not present

## 2023-05-10 DIAGNOSIS — C3411 Malignant neoplasm of upper lobe, right bronchus or lung: Secondary | ICD-10-CM

## 2023-05-10 LAB — CBC WITH DIFFERENTIAL/PLATELET
Abs Immature Granulocytes: 0.04 10*3/uL (ref 0.00–0.07)
Basophils Absolute: 0.1 10*3/uL (ref 0.0–0.1)
Basophils Relative: 1 %
Eosinophils Absolute: 0.1 10*3/uL (ref 0.0–0.5)
Eosinophils Relative: 3 %
HCT: 30.4 % — ABNORMAL LOW (ref 36.0–46.0)
Hemoglobin: 10.3 g/dL — ABNORMAL LOW (ref 12.0–15.0)
Immature Granulocytes: 1 %
Lymphocytes Relative: 8 %
Lymphs Abs: 0.4 10*3/uL — ABNORMAL LOW (ref 0.7–4.0)
MCH: 34 pg (ref 26.0–34.0)
MCHC: 33.9 g/dL (ref 30.0–36.0)
MCV: 100.3 fL — ABNORMAL HIGH (ref 80.0–100.0)
Monocytes Absolute: 0.7 10*3/uL (ref 0.1–1.0)
Monocytes Relative: 14 %
Neutro Abs: 3.8 10*3/uL (ref 1.7–7.7)
Neutrophils Relative %: 73 %
Platelets: 95 10*3/uL — ABNORMAL LOW (ref 150–400)
RBC: 3.03 MIL/uL — ABNORMAL LOW (ref 3.87–5.11)
RDW: 14.7 % (ref 11.5–15.5)
WBC: 5.2 10*3/uL (ref 4.0–10.5)
nRBC: 0 % (ref 0.0–0.2)

## 2023-05-10 LAB — COMPREHENSIVE METABOLIC PANEL
ALT: 8 U/L (ref 0–44)
AST: 15 U/L (ref 15–41)
Albumin: 3.6 g/dL (ref 3.5–5.0)
Alkaline Phosphatase: 63 U/L (ref 38–126)
Anion gap: 8 (ref 5–15)
BUN: 16 mg/dL (ref 8–23)
CO2: 27 mmol/L (ref 22–32)
Calcium: 10 mg/dL (ref 8.9–10.3)
Chloride: 103 mmol/L (ref 98–111)
Creatinine, Ser: 0.77 mg/dL (ref 0.44–1.00)
GFR, Estimated: >60 mL/min (ref >60)
Glucose, Bld: 103 mg/dL — ABNORMAL HIGH (ref 70–99)
Potassium: 3.6 mmol/L (ref 3.5–5.1)
Sodium: 138 mmol/L (ref 135–145)
Total Bilirubin: 0.9 mg/dL (ref 0.0–1.2)
Total Protein: 6.4 g/dL — ABNORMAL LOW (ref 6.5–8.1)

## 2023-05-10 LAB — MAGNESIUM: Magnesium: 1.7 mg/dL (ref 1.7–2.4)

## 2023-05-10 MED ORDER — HEPARIN SOD (PORK) LOCK FLUSH 100 UNIT/ML IV SOLN
500.0000 [IU] | Freq: Once | INTRAVENOUS | Status: AC | PRN
  Administered 2023-05-10: 500 [IU]

## 2023-05-10 MED ORDER — SODIUM CHLORIDE 0.9 % IV SOLN
1500.0000 mg | Freq: Once | INTRAVENOUS | Status: AC
  Administered 2023-05-10: 1500 mg via INTRAVENOUS
  Filled 2023-05-10: qty 30

## 2023-05-10 MED ORDER — SODIUM CHLORIDE 0.9 % IV SOLN
Freq: Once | INTRAVENOUS | Status: AC
Start: 2023-05-10 — End: 2023-05-10

## 2023-05-10 MED ORDER — SODIUM CHLORIDE 0.9% FLUSH
10.0000 mL | INTRAVENOUS | Status: DC | PRN
  Administered 2023-05-10: 10 mL

## 2023-05-10 MED ORDER — SODIUM CHLORIDE 0.9% FLUSH
10.0000 mL | Freq: Once | INTRAVENOUS | Status: AC
  Administered 2023-05-10: 10 mL via INTRAVENOUS

## 2023-05-10 NOTE — Patient Instructions (Addendum)
 [facility] at [facility] Discharge Instructions   You were seen and examined today by Dr. Cheree Cords.  He reviewed the results of your lab work which are normal/stable.   He reviewed the results of your CT scan which was good.   We will proceed with your treatment today.   Return as scheduled.    Thank you for choosing [facility] at [facility] to provide your oncology and hematology care.  To afford each patient quality time with our provider, please arrive at least 15 minutes before your scheduled appointment time.   If you have a lab appointment with the Cancer Center please come in thru the Main Entrance and check in at the main information desk.  You need to re-schedule your appointment should you arrive 10 or more minutes late.  We strive to give you quality time with our providers, and arriving late affects you and other patients whose appointments are after yours.  Also, if you no show three or more times for appointments you may be dismissed from the clinic at the providers discretion.     Again, thank you for choosing [facility].  Our hope is that these requests will decrease the amount of time that you wait before being seen by our physicians.       _____________________________________________________________  Should you have questions after your visit to [facility], please contact our office at [phone] and follow the prompts.  Our office hours are 8:00 a.m. and 4:30 p.m. Monday - Friday.  Please note that voicemails left after 4:00 p.m. may not be returned until the following business day.  We are closed weekends and major holidays.  You do have access to a nurse 24-7, just call the main number to the clinic [phone] and do not press any options, hold on the line and a nurse will answer the phone.    For prescription refill requests, have your pharmacy contact our office and allow 72 hours.     Due to Covid, you will need to wear a mask upon entering the hospital. If you do not have a mask, a mask will be given to you at the Main Entrance upon arrival. For doctor visits, patients may have 1 support person age 23 or older with them. For treatment visits, patients can not have anyone with them due to social distancing guidelines and our immunocompromised population.

## 2023-05-10 NOTE — Progress Notes (Signed)
 Patient presents today for Imfinzi  infusion per providers order.  Vital signs and labs reviewed by MD.  Message received from Donzell Gallery RN/Dr. Katragadda, patient okay for treatment.  Treatment given today per MD orders.  Stable during infusion without adverse affects.  Vital signs stable.  No complaints at this time.  Discharge from clinic ambulatory in stable condition.  Alert and oriented X 3.  Follow up with [facility] as scheduled.

## 2023-05-10 NOTE — Progress Notes (Signed)
 [facility] [address], [place] [address]    Clinic Day:  05/10/2023  Referring physician: Orlena Bitters, MD  Patient Care Team: Orlena Bitters, MD as PCP - General (Internal Medicine) Elmyra Haggard, MD as PCP - Cardiology (Cardiology) Tami Falcon, MD as Consulting Physician (Gastroenterology) Paulett Boros, MD as Medical Oncologist (Medical Oncology) Gerhard Knuckles, RN as Oncology Nurse Navigator (Medical Oncology)   ASSESSMENT & PLAN:   Assessment: 1.  Recurrent extensive stage small cell lung cancer: - Patient seen at the request of Dr. Liam Redhead to get care closer to home. - 4 cycles of carboplatin  and etoposide  with concurrent radiation therapy for limited stage small cell lung cancer on 11/14/2009 - Status post PCI completed on 01/28/2010 - Wedge resection of the RUL by Dr. Ames Bakes on 09/20/2013, pathology consistent with small cell lung cancer. - 4 cycles of carboplatin  and etoposide  from 10/30/2013 through 01/08/2014 - PET scan on 04/23/2012: Marked hypermetabolism in the medial and anterior aspect of the left upper lobe.  Several hypermetabolic lung nodules in the left lung.  9 mm lesion in the left lower lobe.  No metastatic disease in the AP. - Bronchoscopy and FNA of the LUL nodule and LUL brushing and lavage with no malignant cells. - CT chest (08/03/2022): Scattered new nodular densities in the lungs bilaterally, largest in the left lower lobe.  New subcentimeter hypodense lesion in the left hepatic lobe 7 mm.  Periportal adenopathy 2.3 x 2.6 cm. - MRI of brain results from 08/14/2022: No metastatic disease.  Progression of small vessel ischemic disease. - PET scan from 08/13/2022 showed radiation changes in left upper lobe with focal hypermetabolism suggestive of residual recurrent tumor although this is improved from prior PET.  Dominant 8 mm subpleural nodule in the left lower lobe unchanged from recent CT and new from prior PET.  Metastatic  disease not excluded although infection/inflammation is also possible.  Prior hypermetabolic nodules on PET have resolved.  No evidence of metastatic disease in the abdomen or pelvis. - Cycle 1 of carboplatin /etoposide /durvalumab  on 08/17/2022, cycle 4 on 11/02/2022.  Maintenance durvalumab  started on 11/23/2022.   2.  Social/family history: - She moved back to [place] recently and lives with her husband.  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  She also worked at [employer] for 15 years.  Quit smoking in 2010 and smoked 1 pack/day for 30 years. - Mother had brain tumor.  Maternal aunt had breast cancer.    Plan: 1.  Recurrent extensive stage small cell lung cancer: - She does not report any immunotherapy related side effects. - Reviewed CT chest from 05/02/2018: Stable bilateral lung nodules and posttreatment changes with no new findings. - Labs from today: Normal LFTs.  CBC grossly normal with mild to moderate thrombocytopenia stable. - Continue durvalumab  every 4 weeks.  RTC 8 weeks for follow-up.   2.  Gastroparesis: - She has episodes of regurgitation of food on and off.  She is not gaining weight.  She is drinking fair life half can daily.  Continue Reglan  5 mg 3 times daily AC and continue follow-up with GI service.   3.  Hypomagnesemia: - Continue magnesium  daily.   4.  Skin rash: - Has occasional rash on the upper thighs.  Use triamcinolone  if itching.    No orders of the defined types were placed in this encounter.    Claudia [name],acting as a Neurosurgeon for  Paulett Boros, MD.,have documented all relevant documentation on the behalf of Paulett Boros, MD,as directed by  Paulett Boros, MD while in the presence of Paulett Boros, MD.  I, Paulett Boros MD, have reviewed the above documentation for accuracy and completeness, and I agree with the above.      [address] R [name]   2/10/20259:39 AM  CHIEF COMPLAINT:    Diagnosis: recurrent small cell lung cancer    Cancer Staging  Small cell lung cancer (HCC) Staging form: Lung, AJCC 7th Edition - Clinical: Limited Stage Small cell Lung Cancer - Signed by Marlene Simas, MD on 05/10/2013    Prior Therapy: 1. Concurrent chemoradiation with carboplatin  and etoposide , completed 11/14/2009.  2. Prophylactic cranial irradiation, completed 01/28/2010. 3. RUL wedge resection on 09/21/2013 (Dr. Sherene Dilling) 4. 4 cycles of carboplatin  and etoposide , 10/30/2013 - 01/10/2014  Current Therapy:  Carboplatin , etoposide  and durvalumab     HISTORY OF PRESENT ILLNESS:   Oncology History  Small cell lung cancer (HCC)  04/10/2011 Initial Diagnosis   Small cell lung cancer (HCC)   08/17/2022 -  Chemotherapy   Patient is on Treatment Plan : LUNG SMALL CELL EXTENSIVE STAGE Durvalumab  + Carboplatin  D1 + Etoposide  D1-3 q21d x 4 Cycles / Durvalumab  q28d        INTERVAL HISTORY:   Claudia [name] is a 78 y.o. female seen for follow-up of recurrent small cell lung cancer.  She was last seen by me on 03/15/23.  Since her last visit, she underwent CT chest on 05/03/23 that found: Evolving post treatment collapse/consolidation in the left lung. No evidence of disease progression. Multiple tiny bilateral pulmonary nodules, stable. Achalasia. Hyperdense lesion off the upper pole right kidney, stable dating back to at least 10/07/2021 and possibly a hyperdense cyst.  Today, she states that she is doing well overall. Her appetite level is at 100%. Her energy level is at 100%.   PAST MEDICAL HISTORY:   Past Medical History: Past Medical History:  Diagnosis Date   Achalasia    Anemia    Arthritis    Bradycardia    mild,may be due to beta blocker therapy   COPD (chronic obstructive pulmonary disease) (HCC)    Gastroparesis    GERD (gastroesophageal reflux disease)    otc   Hypertension 06/01/2019   pt denies   Local recurrence of lung cancer (HCC) dx'd 07/2013   rt thoracotomy chemo  comp 12/2013   Lung cancer (HCC) 03/30/2008   Dr. Marguerita Shih, finished chemo, sp radiation, left upper    Lymphocytic colitis    Pneumonia     Surgical History: Past Surgical History:  Procedure Laterality Date   BRONCHIAL BIOPSY  05/28/2022   Procedure: BRONCHIAL BIOPSIES;  Surgeon: Gloriajean Large, MD;  Location: [facility] ENDOSCOPY;  Service: Pulmonary;;   BRONCHIAL BRUSHINGS  05/28/2022   Procedure: BRONCHIAL BRUSHINGS;  Surgeon: Gloriajean Large, MD;  Location: [facility] ENDOSCOPY;  Service: Pulmonary;;   BRONCHIAL NEEDLE ASPIRATION BIOPSY  05/28/2022   Procedure: BRONCHIAL NEEDLE ASPIRATION BIOPSIES;  Surgeon: Gloriajean Large, MD;  Location[facility] ENDOSCOPY;  Service: Pulmonary;;   BRONCHIAL WASHINGS  05/28/2022   Procedure: BRONCHIAL WASHINGS;  Surgeon: Gloriajean Large, MD;  Location: [facility] ENDOSCOPY;  Service: Pulmonary;;   BRONCHOSCOPY  2011   DILATION AND CURETTAGE OF UTERUS     ESOPHAGOGASTRODUODENOSCOPY (EGD) WITH PROPOFOL  N/A 07/29/2021   Procedure: ESOPHAGOGASTRODUODENOSCOPY (EGD) WITH PROPOFOL ;  Surgeon: Urban Garden, MD;  Location: [facility] ENDO SUITE;  Service: Gastroenterology;  Laterality: N/A;  1235 ASA  2   HEMOSTASIS CONTROL  05/28/2022   Procedure: HEMOSTASIS CONTROL;  Surgeon: Gloriajean Large, MD;  Location: [facility] ENDOSCOPY;  Service: Pulmonary;;   IR IMAGING GUIDED PORT INSERTION  08/12/2022   NECK SURGERY  1980's   THORACOTOMY Right 09/21/2013   Procedure: THORACOTOMY MAJOR;  Surgeon: Bartley Lightning, MD;  Location: [facility] OR;  Service: Thoracic;  Laterality: Right;   UTERINE FIBROID SURGERY  2012   WEDGE RESECTION Right 09/21/2013   Procedure: RIGHT UPPER LOBE WEDGE RESECTION;  Surgeon: Bartley Lightning, MD;  Location: [facility] OR;  Service: Thoracic;  Laterality: Right;    Social History: Social History   Socioeconomic History   Marital status: Married    Spouse name: Not on file   Number of children: Not on file   Years of education: Not on file   Highest education level: Not  on file  Occupational History   Occupation: clerical    Employer: [employer]  Tobacco Use   Smoking status: Former    Current packs/day: 0.00    Average packs/day: 1 pack/day for 35.0 years (35.0 ttl pk-yrs)    Types: Cigarettes    Start date: 03/30/1972    Quit date: 03/31/2007    Years since quitting: 16.1    Passive exposure: Past   Smokeless tobacco: Never  Vaping Use   Vaping status: Never Used  Substance and Sexual Activity   Alcohol  use: Yes    Comment: occassional about 3 drinks per year   Drug use: No   Sexual activity: Never  Other Topics Concern   Not on file  Social History Narrative   Not on file   Social Drivers of Health   Financial Resource Strain: Not on file  Food Insecurity: No Food Insecurity (08/06/2022)   Hunger Vital Sign    Worried About Running Out of Food in the Last Year: Never true    Ran Out of Food in the Last Year: Never true  Transportation Needs: No Transportation Needs (08/06/2022)   PRAPARE - Administrator, Civil Service (Medical): No    Lack of Transportation (Non-Medical): No  Physical Activity: Not on file  Stress: Not on file  Social Connections: Not on file  Intimate Partner Violence: Not At Risk (08/06/2022)   Humiliation, Afraid, Rape, and Kick questionnaire    Fear of Current or Ex-Partner: No    Emotionally Abused: No    Physically Abused: No    Sexually Abused: No    Family History: Family History  Problem Relation Age of Onset   Brain cancer Mother        brain cancer   Emphysema Father    Diabetes Son    Heart disease Paternal Grandfather    Breast cancer Maternal Aunt     Current Medications:  Current Outpatient Medications:    bacitracin-polymyxin b (POLYSPORIN) ophthalmic ointment, SMARTSIG:0.25 Inch(es) In Eye(s) 4 Times Daily, Disp: , Rfl:    Calcium  Carbonate Antacid (TUMS PO), Take 500-1,000 mg by mouth 3 (three) times daily as needed (indigestion/heartburn.)., Disp: , Rfl:     lidocaine -prilocaine  (EMLA ) cream, Apply 1 Application topically as needed., Disp: 30 g, Rfl: 1   lidocaine -prilocaine  (EMLA ) cream, Apply to affected area once, Disp: 30 g, Rfl: 3   magnesium  oxide (MAG-OX) 400 (240 Mg) MG tablet, TAKE 1 TABLET BY MOUTH DAILY, Disp: 60 tablet, Rfl: 2   metoCLOPramide  (REGLAN ) 5 MG tablet, Take 1 tablet (5 mg total) by mouth 3 (three) times daily  before meals., Disp: 180 tablet, Rfl: 3   tretinoin (RETIN-A) 0.1 % cream, Apply 1 application  topically at bedtime., Disp: , Rfl:    Allergies: Allergies  Allergen Reactions   Azithromycin Shortness Of Breath and Rash    Took Tussionex at same time Jan 2013.   Tussionex Pennkinetic Er [Hydrocod Poli-Chlorphe Poli Er] Shortness Of Breath and Rash    Took Azithromycin at same time Jan 2013   Omeprazole  Rash    REVIEW OF SYSTEMS:   Review of Systems  Constitutional:  Negative for chills, fatigue and fever.  HENT:   Negative for lump/mass, mouth sores, nosebleeds, sore throat and trouble swallowing.   Eyes:  Negative for eye problems.  Respiratory:  Negative for cough and shortness of breath.   Cardiovascular:  Negative for chest pain, leg swelling and palpitations.  Gastrointestinal:  Positive for constipation and nausea. Negative for abdominal pain, diarrhea and vomiting.  Genitourinary:  Negative for bladder incontinence, difficulty urinating, dysuria, frequency, hematuria and nocturia.   Musculoskeletal:  Negative for arthralgias, back pain, flank pain, myalgias and neck pain.  Skin:  Negative for itching and rash.  Neurological:  Negative for dizziness, headaches and numbness.  Hematological:  Does not bruise/bleed easily.  Psychiatric/Behavioral:  Negative for depression, sleep disturbance and suicidal ideas. The patient is not nervous/anxious.   All other systems reviewed and are negative.    VITALS:   There were no vitals taken for this visit.  Wt Readings from Last 3 Encounters:  04/13/23 85 lb  12.8 oz (38.9 kg)  03/15/23 86 lb 6.4 oz (39.2 kg)  02/15/23 86 lb 6.4 oz (39.2 kg)    There is no height or weight on file to calculate BMI.  Performance status (ECOG): 1 - Symptomatic but completely ambulatory  PHYSICAL EXAM:   Physical Exam Vitals and nursing note reviewed. Exam conducted with a chaperone present.  Constitutional:      Appearance: Normal appearance.  Cardiovascular:     Rate and Rhythm: Normal rate and regular rhythm.     Pulses: Normal pulses.     Heart sounds: Normal heart sounds.  Pulmonary:     Effort: Pulmonary effort is normal.     Breath sounds: Normal breath sounds.  Abdominal:     Palpations: Abdomen is soft. There is no hepatomegaly, splenomegaly or mass.     Tenderness: There is no abdominal tenderness.  Musculoskeletal:     Right lower leg: No edema.     Left lower leg: No edema.  Lymphadenopathy:     Cervical: No cervical adenopathy.     Right cervical: No superficial, deep or posterior cervical adenopathy.    Left cervical: No superficial, deep or posterior cervical adenopathy.     Upper Body:     Right upper body: No supraclavicular or axillary adenopathy.     Left upper body: No supraclavicular or axillary adenopathy.  Neurological:     General: No focal deficit present.     Mental Status: She is alert and oriented to person, place, and time.  Psychiatric:        Mood and Affect: Mood normal.        Behavior: Behavior normal.     LABS:      Latest Ref Rng & Units 04/13/2023    1:22 PM 03/09/2023   11:10 AM 02/15/2023    9:25 AM  CBC  WBC 4.0 - 10.5 K/uL 4.0  6.5  2.2   Hemoglobin 12.0 - 15.0 g/dL 10.4  10.4  10.8   Hematocrit 36.0 - 46.0 % 31.3  32.1  32.1   Platelets 150 - 400 K/uL 97  102  87       Latest Ref Rng & Units 04/13/2023    1:22 PM 03/09/2023   11:10 AM 02/15/2023    9:25 AM  CMP  Glucose 70 - 99 mg/dL 540  94  981   BUN 8 - 23 mg/dL 17  15  15    Creatinine 0.44 - 1.00 mg/dL 1.91  4.78  2.95   Sodium 135 -  145 mmol/L 137  138  136   Potassium 3.5 - 5.1 mmol/L 3.8  3.6  3.7   Chloride 98 - 111 mmol/L 105  103  101   CO2 22 - 32 mmol/L 26  26  28    Calcium  8.9 - 10.3 mg/dL 9.6  9.8  9.8   Total Protein 6.5 - 8.1 g/dL 6.4  6.5  6.6   Total Bilirubin 0.0 - 1.2 mg/dL 1.0  0.6  0.9   Alkaline Phos 38 - 126 U/L 63  65  56   AST 15 - 41 U/L 15  14  16    ALT 0 - 44 U/L 8  8  9       No results found for: "CEA1", "CEA" / No results found for: "CEA1", "CEA" No results found for: "PSA1" No results found for: "AOZ308" No results found for: "CAN125"  No results found for: "TOTALPROTELP", "ALBUMINELP", "A1GS", "A2GS", "BETS", "BETA2SER", "GAMS", "MSPIKE", "SPEI" Lab Results  Component Value Date   TIBC 238 (L) 08/17/2022   TIBC 149 (L) 10/08/2021   TIBC 159 (L) 04/10/2011   FERRITIN 296 08/17/2022   FERRITIN 675 (H) 10/08/2021   FERRITIN 1,680 (H) 04/10/2011   IRONPCTSAT 21 08/17/2022   IRONPCTSAT 9 (L) 10/08/2021   IRONPCTSAT 18 (L) 04/10/2011   No results found for: "LDH"   STUDIES:   CT CHEST W CONTRAST Result Date: 05/10/2023 CLINICAL DATA:  Small cell lung cancer.  * Tracking Code: BO * EXAM: CT CHEST WITH CONTRAST TECHNIQUE: Multidetector CT imaging of the chest was performed during intravenous contrast administration. RADIATION DOSE REDUCTION: This exam was performed according to the departmental dose-optimization program which includes automated exposure control, adjustment of the mA and/or kV according to patient size and/or use of iterative reconstruction technique. CONTRAST:  75mL OMNIPAQUE  IOHEXOL  300 MG/ML  SOLN COMPARISON:  03/09/2023. FINDINGS: Cardiovascular: Right IJ Port-A-Cath terminates near the SVC RA junction. Atherosclerotic calcification of the aorta. Left ventricle is dilated. Heart is at the upper limits of normal in size to mildly enlarged. No pericardial effusion. Mediastinum/Nodes: No pathologically enlarged mediastinal, hilar or axillary lymph nodes. Markedly distended  esophagus containing air and food debris. Lungs/Pleura: Centrilobular and paraseptal emphysema. Evolving collapse/consolidation in the left upper and left lower lobes. Multiple scattered pulmonary nodules measure 4 mm or less in size, unchanged. Right upper lobe wedge resection. No pleural fluid. Airway is otherwise unremarkable. Upper Abdomen: Focal blush of hyperattenuation in the left hepatic lobe (2/136), possibly a flash fill hemangioma or perfusion anomaly. 12 mm hyperdense lesion off the upper pole right kidney (2/153), stable dating back to at least 10/07/2021. Additional low-attenuation lesions in the kidneys. Visualized portions of the liver, gallbladder, adrenal glands, kidneys, spleen, pancreas, stomach and bowel are otherwise grossly unremarkable. No upper abdominal adenopathy. Musculoskeletal: Degenerative changes in the spine. No worrisome lytic or sclerotic lesions. IMPRESSION: 1. Evolving post treatment collapse/consolidation in the left lung. No  evidence of disease progression. 2. Multiple tiny bilateral pulmonary nodules, stable. Recommend continued attention on follow-up. 3. Achalasia. 4. Hyperdense lesion off the upper pole right kidney, stable dating back to at least 10/07/2021 and possibly a hyperdense cyst. Recommend continued attention on follow-up. 5. Aortic atherosclerosis (ICD10-I70.0). Left ventricular dilatation. 6.  Emphysema (ICD10-J43.9). Electronically Signed   By: Shearon Denis M.D.   On: 05/10/2023 08:57

## 2023-05-10 NOTE — Patient Instructions (Signed)
 [facility] - A DEPT OF [facility]  Discharge Instructions: Thank you for choosing [facility] to provide your oncology and hematology care.  If you have a lab appointment with the Cancer Center - please note that after April 8th, 2024, all labs will be drawn in the cancer center.  You do not have to check in or register with the main entrance as you have in the past but will complete your check-in in the cancer center.  Wear comfortable clothing and clothing appropriate for easy access to any Portacath or PICC line.   We strive to give you quality time with your provider. You may need to reschedule your appointment if you arrive late (15 or more minutes).  Arriving late affects you and other patients whose appointments are after yours.  Also, if you miss three or more appointments without notifying the office, you may be dismissed from the clinic at the provider's discretion.      For prescription refill requests, have your pharmacy contact our office and allow 72 hours for refills to be completed.    Today you received the following chemotherapy and/or immunotherapy agents Imfinzi      To help prevent nausea and vomiting after your treatment, we encourage you to take your nausea medication as directed.  BELOW ARE SYMPTOMS THAT SHOULD BE REPORTED IMMEDIATELY: *FEVER GREATER THAN 100.4 F (38 C) OR HIGHER *CHILLS OR SWEATING *NAUSEA AND VOMITING THAT IS NOT CONTROLLED WITH YOUR NAUSEA MEDICATION *UNUSUAL SHORTNESS OF BREATH *UNUSUAL BRUISING OR BLEEDING *URINARY PROBLEMS (pain or burning when urinating, or frequent urination) *BOWEL PROBLEMS (unusual diarrhea, constipation, pain near the anus) TENDERNESS IN MOUTH AND THROAT WITH OR WITHOUT PRESENCE OF ULCERS (sore throat, sores in mouth, or a toothache) UNUSUAL RASH, SWELLING OR PAIN  UNUSUAL VAGINAL DISCHARGE OR ITCHING   Items with * indicate a potential emergency and should be followed up  as soon as possible or go to the Emergency Department if any problems should occur.  Please show the CHEMOTHERAPY ALERT CARD or IMMUNOTHERAPY ALERT CARD at check-in to the Emergency Department and triage nurse.  Should you have questions after your visit or need to cancel or reschedule your appointment, please contact [facility] [place] - A DEPT OF [facility] [phone]  and follow the prompts.  Office hours are 8:00 a.m. to 4:30 p.m. Monday - Friday. Please note that voicemails left after 4:00 p.m. may not be returned until the following business day.  We are closed weekends and major holidays. You have access to a nurse at all times for urgent questions. Please call the main number to the clinic [phone] and follow the prompts.  For any non-urgent questions, you may also contact your provider using MyChart. We now offer e-Visits for anyone 42 and older to request care online for non-urgent symptoms. For details visit [email or web address].   Also download the MyChart app! Go to the app store, search "MyChart", open the app, select [place], and log in with your MyChart username and password.

## 2023-05-10 NOTE — Progress Notes (Signed)
 Ok to proceed with Platelets 95K  V.O. Dr Davina Ester, PharmD

## 2023-05-11 ENCOUNTER — Other Ambulatory Visit: Payer: Self-pay

## 2023-05-24 DIAGNOSIS — D3132 Benign neoplasm of left choroid: Secondary | ICD-10-CM | POA: Diagnosis not present

## 2023-05-24 DIAGNOSIS — H04123 Dry eye syndrome of bilateral lacrimal glands: Secondary | ICD-10-CM | POA: Diagnosis not present

## 2023-05-24 DIAGNOSIS — H40013 Open angle with borderline findings, low risk, bilateral: Secondary | ICD-10-CM | POA: Diagnosis not present

## 2023-05-24 DIAGNOSIS — L821 Other seborrheic keratosis: Secondary | ICD-10-CM | POA: Diagnosis not present

## 2023-05-24 DIAGNOSIS — L570 Actinic keratosis: Secondary | ICD-10-CM | POA: Diagnosis not present

## 2023-05-24 DIAGNOSIS — D485 Neoplasm of uncertain behavior of skin: Secondary | ICD-10-CM | POA: Diagnosis not present

## 2023-05-24 DIAGNOSIS — H26493 Other secondary cataract, bilateral: Secondary | ICD-10-CM | POA: Diagnosis not present

## 2023-05-24 DIAGNOSIS — L578 Other skin changes due to chronic exposure to nonionizing radiation: Secondary | ICD-10-CM | POA: Diagnosis not present

## 2023-05-24 DIAGNOSIS — Z411 Encounter for cosmetic surgery: Secondary | ICD-10-CM | POA: Diagnosis not present

## 2023-05-24 DIAGNOSIS — D1801 Hemangioma of skin and subcutaneous tissue: Secondary | ICD-10-CM | POA: Diagnosis not present

## 2023-05-24 DIAGNOSIS — Z85828 Personal history of other malignant neoplasm of skin: Secondary | ICD-10-CM | POA: Diagnosis not present

## 2023-05-24 DIAGNOSIS — L814 Other melanin hyperpigmentation: Secondary | ICD-10-CM | POA: Diagnosis not present

## 2023-06-07 ENCOUNTER — Inpatient Hospital Stay: Payer: Medicare PPO

## 2023-06-07 ENCOUNTER — Inpatient Hospital Stay: Payer: Medicare PPO | Admitting: Hematology

## 2023-06-07 ENCOUNTER — Inpatient Hospital Stay: Payer: Medicare PPO | Attending: Nurse Practitioner

## 2023-06-07 ENCOUNTER — Encounter: Payer: Self-pay | Admitting: Hematology

## 2023-06-07 VITALS — BP 118/48 | HR 79 | Temp 97.7°F | Resp 18

## 2023-06-07 DIAGNOSIS — C3411 Malignant neoplasm of upper lobe, right bronchus or lung: Secondary | ICD-10-CM | POA: Diagnosis not present

## 2023-06-07 DIAGNOSIS — Z79620 Long term (current) use of immunosuppressive biologic: Secondary | ICD-10-CM | POA: Diagnosis not present

## 2023-06-07 DIAGNOSIS — Z5112 Encounter for antineoplastic immunotherapy: Secondary | ICD-10-CM | POA: Diagnosis not present

## 2023-06-07 LAB — CBC WITH DIFFERENTIAL/PLATELET
Abs Immature Granulocytes: 0.03 10*3/uL (ref 0.00–0.07)
Basophils Absolute: 0 10*3/uL (ref 0.0–0.1)
Basophils Relative: 1 %
Eosinophils Absolute: 0 10*3/uL (ref 0.0–0.5)
Eosinophils Relative: 1 %
HCT: 33.8 % — ABNORMAL LOW (ref 36.0–46.0)
Hemoglobin: 11 g/dL — ABNORMAL LOW (ref 12.0–15.0)
Immature Granulocytes: 1 %
Lymphocytes Relative: 12 %
Lymphs Abs: 0.4 10*3/uL — ABNORMAL LOW (ref 0.7–4.0)
MCH: 32.9 pg (ref 26.0–34.0)
MCHC: 32.5 g/dL (ref 30.0–36.0)
MCV: 101.2 fL — ABNORMAL HIGH (ref 80.0–100.0)
Monocytes Absolute: 0.6 10*3/uL (ref 0.1–1.0)
Monocytes Relative: 20 %
Neutro Abs: 2.1 10*3/uL (ref 1.7–7.7)
Neutrophils Relative %: 65 %
Platelets: 112 10*3/uL — ABNORMAL LOW (ref 150–400)
RBC: 3.34 MIL/uL — ABNORMAL LOW (ref 3.87–5.11)
RDW: 14.6 % (ref 11.5–15.5)
WBC: 3.2 10*3/uL — ABNORMAL LOW (ref 4.0–10.5)
nRBC: 0 % (ref 0.0–0.2)

## 2023-06-07 LAB — COMPREHENSIVE METABOLIC PANEL
ALT: 7 U/L (ref 0–44)
AST: 15 U/L (ref 15–41)
Albumin: 3.6 g/dL (ref 3.5–5.0)
Alkaline Phosphatase: 63 U/L (ref 38–126)
Anion gap: 8 (ref 5–15)
BUN: 15 mg/dL (ref 8–23)
CO2: 28 mmol/L (ref 22–32)
Calcium: 9.8 mg/dL (ref 8.9–10.3)
Chloride: 102 mmol/L (ref 98–111)
Creatinine, Ser: 0.75 mg/dL (ref 0.44–1.00)
GFR, Estimated: >60 mL/min (ref >60)
Glucose, Bld: 93 mg/dL (ref 70–99)
Potassium: 3.5 mmol/L (ref 3.5–5.1)
Sodium: 138 mmol/L (ref 135–145)
Total Bilirubin: 0.6 mg/dL (ref 0.0–1.2)
Total Protein: 6.6 g/dL (ref 6.5–8.1)

## 2023-06-07 LAB — TSH: TSH: 0.708 u[IU]/mL (ref 0.350–4.500)

## 2023-06-07 LAB — MAGNESIUM: Magnesium: 1.7 mg/dL (ref 1.7–2.4)

## 2023-06-07 MED ORDER — HEPARIN SOD (PORK) LOCK FLUSH 100 UNIT/ML IV SOLN
500.0000 [IU] | Freq: Once | INTRAVENOUS | Status: AC | PRN
  Administered 2023-06-07: 500 [IU]

## 2023-06-07 MED ORDER — SODIUM CHLORIDE 0.9% FLUSH
10.0000 mL | Freq: Once | INTRAVENOUS | Status: AC
  Administered 2023-06-07: 10 mL via INTRAVENOUS

## 2023-06-07 MED ORDER — SODIUM CHLORIDE 0.9 % IV SOLN: Freq: Once | INTRAVENOUS | Status: AC

## 2023-06-07 MED ORDER — SODIUM CHLORIDE 0.9 % IV SOLN
1500.0000 mg | Freq: Once | INTRAVENOUS | Status: AC
  Administered 2023-06-07: 1500 mg via INTRAVENOUS
  Filled 2023-06-07: qty 30

## 2023-06-07 MED ORDER — SODIUM CHLORIDE 0.9% FLUSH
10.0000 mL | INTRAVENOUS | Status: DC | PRN
  Administered 2023-06-07: 10 mL

## 2023-06-07 NOTE — Patient Instructions (Signed)
 [facility] [place] - A DEPT OF [facility]  Discharge Instructions: Thank you for choosing [facility] to provide your oncology and hematology care.  If you have a lab appointment with the Cancer Center - please note that after April 8th, 2024, all labs will be drawn in the cancer center.  You do not have to check in or register with the main entrance as you have in the past but will complete your check-in in the cancer center.  Wear comfortable clothing and clothing appropriate for easy access to any Portacath or PICC line.   We strive to give you quality time with your provider. You may need to reschedule your appointment if you arrive late (15 or more minutes).  Arriving late affects you and other patients whose appointments are after yours.  Also, if you miss three or more appointments without notifying the office, you may be dismissed from the clinic at the provider's discretion.      For prescription refill requests, have your pharmacy contact our office and allow 72 hours for refills to be completed.    Today you received the following chemotherapy and/or immunotherapy agents Imfinzi   To help prevent nausea and vomiting after your treatment, we encourage you to take your nausea medication as directed.  Durvalumab Injection What is this medication? DURVALUMAB (dur VAL ue mab) treats some types of cancer. It works by helping your immune system slow or stop the spread of cancer cells. It is a monoclonal antibody. This medicine may be used for other purposes; ask your health care provider or pharmacist if you have questions. COMMON BRAND NAME(S): IMFINZI What should I tell my care team before I take this medication? They need to know if you have any of these conditions: Allogeneic stem cell transplant (uses someone else's stem cells) Autoimmune diseases, such as Crohn disease, ulcerative colitis, lupus History of chest radiation Nervous system  problems, such as Guillain-Barre syndrome, myasthenia gravis Organ transplant An unusual or allergic reaction to durvalumab, other medications, foods, dyes, or preservatives Pregnant or trying to get pregnant Breast-feeding How should I use this medication? This medication is infused into a vein. It is given by your care team in a hospital or clinic setting. A special MedGuide will be given to you before each treatment. Be sure to read this information carefully each time. Talk to your care team about the use of this medication in children. Special care may be needed. Overdosage: If you think you have taken too much of this medicine contact a poison control center or emergency room at once. NOTE: This medicine is only for you. Do not share this medicine with others. What if I miss a dose? Keep appointments for follow-up doses. It is important not to miss your dose. Call your care team if you are unable to keep an appointment. What may interact with this medication? Interactions have not been studied. This list may not describe all possible interactions. Give your health care provider a list of all the medicines, herbs, non-prescription drugs, or dietary supplements you use. Also tell them if you smoke, drink alcohol, or use illegal drugs. Some items may interact with your medicine. What should I watch for while using this medication? Your condition will be monitored carefully while you are receiving this medication. You may need blood work while taking this medication. This medication may cause serious skin reactions. They can happen weeks to months after starting the medication. Contact  your care team right away if you notice fevers or flu-like symptoms with a rash. The rash may be red or purple and then turn into blisters or peeling of the skin. You may also notice a red rash with swelling of the face, lips, or lymph nodes in your neck or under your arms. Tell your care team right away if you  have any change in your eyesight. Talk to your care team if you may be pregnant. Serious birth defects can occur if you take this medication during pregnancy and for 3 months after the last dose. You will need a negative pregnancy test before starting this medication. Contraception is recommended while taking this medication and for 3 months after the last dose. Your care team can help you find the option that works for you. Do not breastfeed while taking this medication and for 3 months after the last dose. What side effects may I notice from receiving this medication? Side effects that you should report to your care team as soon as possible: Allergic reactions--skin rash, itching, hives, swelling of the face, lips, tongue, or throat Dry cough, shortness of breath or trouble breathing Eye pain, redness, irritation, or discharge with blurry or decreased vision Heart muscle inflammation--unusual weakness or fatigue, shortness of breath, chest pain, fast or irregular heartbeat, dizziness, swelling of the ankles, feet, or hands Hormone gland problems--headache, sensitivity to light, unusual weakness or fatigue, dizziness, fast or irregular heartbeat, increased sensitivity to cold or heat, excessive sweating, constipation, hair loss, increased thirst or amount of urine, tremors or shaking, irritability Infusion reactions--chest pain, shortness of breath or trouble breathing, feeling faint or lightheaded Kidney injury (glomerulonephritis)--decrease in the amount of urine, red or dark brown urine, foamy or bubbly urine, swelling of the ankles, hands, or feet Liver injury--right upper belly pain, loss of appetite, nausea, light-colored stool, dark yellow or brown urine, yellowing skin or eyes, unusual weakness or fatigue Pain, tingling, or numbness in the hands or feet, muscle weakness, change in vision, confusion or trouble speaking, loss of balance or coordination, trouble walking, seizures Rash, fever, and  swollen lymph nodes Redness, blistering, peeling, or loosening of the skin, including inside the mouth Sudden or severe stomach pain, bloody diarrhea, fever, nausea, vomiting Side effects that usually do not require medical attention (report these to your care team if they continue or are bothersome): Bone, joint, or muscle pain Diarrhea Fatigue Loss of appetite Nausea Skin rash This list may not describe all possible side effects. Call your doctor for medical advice about side effects. You may report side effects to FDA at 1-800-FDA-1088. Where should I keep my medication? This medication is given in a hospital or clinic. It will not be stored at home. NOTE: This sheet is a summary. It may not cover all possible information. If you have questions about this medicine, talk to your doctor, pharmacist, or health care provider.  2024 Elsevier/Gold Standard (2021-07-29 00:00:00)   BELOW ARE SYMPTOMS THAT SHOULD BE REPORTED IMMEDIATELY: *FEVER GREATER THAN 100.4 F (38 C) OR HIGHER *CHILLS OR SWEATING *NAUSEA AND VOMITING THAT IS NOT CONTROLLED WITH YOUR NAUSEA MEDICATION *UNUSUAL SHORTNESS OF BREATH *UNUSUAL BRUISING OR BLEEDING *URINARY PROBLEMS (pain or burning when urinating, or frequent urination) *BOWEL PROBLEMS (unusual diarrhea, constipation, pain near the anus) TENDERNESS IN MOUTH AND THROAT WITH OR WITHOUT PRESENCE OF ULCERS (sore throat, sores in mouth, or a toothache) UNUSUAL RASH, SWELLING OR PAIN  UNUSUAL VAGINAL DISCHARGE OR ITCHING   Items with * indicate a  potential emergency and should be followed up as soon as possible or go to the Emergency Department if any problems should occur.  Please show the CHEMOTHERAPY ALERT CARD or IMMUNOTHERAPY ALERT CARD at check-in to the Emergency Department and triage nurse.  Should you have questions after your visit or need to cancel or reschedule your appointment, please contact [facility] [place] - A DEPT OF [facility] [phone]  and follow the prompts.  Office hours are 8:00 a.m. to 4:30 p.m. Monday - Friday. Please note that voicemails left after 4:00 p.m. may not be returned until the following business day.  We are closed weekends and major holidays. You have access to a nurse at all times for urgent questions. Please call the main number to the clinic [phone] and follow the prompts.  For any non-urgent questions, you may also contact your provider using MyChart. We now offer e-Visits for anyone 52 and older to request care online for non-urgent symptoms. For details visit [email or web address].   Also download the MyChart app! Go to the app store, search "MyChart", open the app, select [place], and log in with your MyChart username and password.

## 2023-06-07 NOTE — Progress Notes (Signed)
 Patient presents today for Imfinzi infusion. Patient is in satisfactory condition with no new complaints voiced.  Vital signs are stable.  Labs reviewed and all labs are within treatment parameters.  We will proceed with treatment per MD orders.    Treatment given today per MD orders. Tolerated infusion without adverse affects. Vital signs stable. No complaints at this time. Discharged from clinic ambulatory in stable condition. Alert and oriented x 3. F/U with [facility] as scheduled.

## 2023-06-19 ENCOUNTER — Other Ambulatory Visit: Payer: Self-pay

## 2023-06-22 ENCOUNTER — Ambulatory Visit (INDEPENDENT_AMBULATORY_CARE_PROVIDER_SITE_OTHER): Payer: Medicare PPO | Admitting: Gastroenterology

## 2023-06-22 ENCOUNTER — Encounter (INDEPENDENT_AMBULATORY_CARE_PROVIDER_SITE_OTHER): Payer: Self-pay | Admitting: Gastroenterology

## 2023-06-22 VITALS — BP 126/74 | HR 76 | Temp 98.0°F | Ht 62.0 in | Wt 85.1 lb

## 2023-06-22 DIAGNOSIS — K3184 Gastroparesis: Secondary | ICD-10-CM | POA: Diagnosis not present

## 2023-06-22 DIAGNOSIS — K224 Dyskinesia of esophagus: Secondary | ICD-10-CM | POA: Diagnosis not present

## 2023-06-22 DIAGNOSIS — K5900 Constipation, unspecified: Secondary | ICD-10-CM | POA: Diagnosis not present

## 2023-06-22 DIAGNOSIS — E440 Moderate protein-calorie malnutrition: Secondary | ICD-10-CM

## 2023-06-22 NOTE — Patient Instructions (Signed)
 Continue reglan for now, let's try increasing this to twice daily, taking a dose in the mornings and your evening dose as you have been doing Aim for a high protein meal in the morning and a high protein meal or snack midday with some greek yogurt/protein shake. Keep follow up with Dr. Margaretha Glassing at [facility] to see what his further recommendations are. Given your gastroparesis and constipation, we may consider starting a medication called prucalopride which may help with both of these.   Follow up 3 months  It was a pleasure to see you today. I want to create trusting relationships with patients and provide genuine, compassionate, and quality care. I truly value your feedback! please be on the lookout for a survey regarding your visit with me today. I appreciate your input about our visit and your time in completing this!    Christyana Corwin L. Jeanmarie Hubert, MSN, APRN, AGNP-C Adult-Gerontology Nurse Practitioner [facility] Gastroenterology at [facility]

## 2023-06-22 NOTE — Progress Notes (Addendum)
 Referring Provider: Ignatius Specking, MD Primary Care Physician:  Ignatius Specking, MD Primary GI Physician: Dr. Levon Hedger   Chief Complaint  Patient presents with   achalasia    Follow up on achalasia. Takes one half protein shake twice a day.     HPI:   Claudia [name] is a 78 y.o. female with past medical history of recurrent small cell lung cancer status post chemotherapy with carboplatin and etoposide, status post radiation therapy and prophylactic cranial radiation, recurrent malignancy was treated with wedge resection, possible lymphocytic colitis, COPD, GERD, severe gastroparesis, possible achalasia   Patient presenting today for:  Follow up of gastroparesis, esophageal dysmotility, weight loss  Last seen September 2024, at that time had finished chemo but still doing immunotherapy.  Had to cancel manometry, at [facility] and GI specialist 1 day due to chemo/appointments.  She is ready to get the scheduled.  Appetite comes and goes.  Swallowing almost back to normal.  Occasional nausea.  No longer doing any Reglan.  Continue to lose weight though doing great with protein shakes usually once a day.  Having some intermittent constipation which she takes stool softener for.  Patient underwent esophageal manometry on 01/14/2023 which showed normal IRP of 12.1, percent incomplete bolus clearance consistent with absent peristalsis.  She was advised to be referred to [facility] for evaluation with Dr. Ebony Cargo for dysphagia with abnormal esophagram condition concerning for achalasia and manometry with aperistalsis.  Per chart review, patient was seen by Dr. Margaretha Glassing in January due to long wait time for appointment with Dr. Ebony Cargo.  She was recommended to start Reglan 5 mg 3 times a day with plans to increase dose if no improvement after 8 weeks.  It was felt that some of her symptoms may actually be a manifestation of paraneoplastic syndrome from her lung cancer.  Present: She states that she was  restarted on reglan by Dr. Margaretha Glassing, she is taking it before dinner only as she notes she is never quite sure when she will be eating breakfast or lunch. She feels that this has helped some. Reports that she was told she really needed to gain weight and there was also discussion of a feeding tube if symptoms did not improve. She feels that swallowing is doing okay currently. Appetite is not great as she states she can go all day without eating. She is doing one protein shake per day as she notes she is tired of these. She will usually have a small "brunch" with either toast or some yogurt. She does note if she eats a heavier meal in the evenings she will sometimes spit up. She has not tried to take reglan more than once per day.she did meet with nutritionist who reiterated high protein foods.   She is taking a stool softener occasionally for constipation which seems to help some. She was also started on mag oxide which has helped with her sleep but unsure if this has helped with her constipation. She is not able to drink much water and notes stools are harder. She is having a BM maybe every two days.    Barium esophagram 04/2022: concerning for achalasia  Last Colonoscopy:Last Colonoscopy:2013 - sigmoid diverticulosis, patchy loss of vascular marking, biopsies performed but no results available Last Endoscopy:5/2/23Fluid in the esophagus. Fluid aspiration performed. - A large amount of food (residue) in the stomach. - Normal examined duodenum GES:08/13/21: severe delay in gastric emptying, recommend eat small meals throughout the day    Past  Medical History:  Diagnosis Date   Achalasia    Anemia    Arthritis    Bradycardia    mild,may be due to beta blocker therapy   COPD (chronic obstructive pulmonary disease) (HCC)    Gastroparesis    GERD (gastroesophageal reflux disease)    otc   Hypertension 06/01/2019   pt denies   Local recurrence of lung cancer (HCC) dx'd 07/2013   rt thoracotomy chemo  comp 12/2013   Lung cancer (HCC) 03/30/2008   Dr. Arbutus Ped, finished chemo, sp radiation, left upper    Lymphocytic colitis    Pneumonia     Past Surgical History:  Procedure Laterality Date   BRONCHIAL BIOPSY  05/28/2022   Procedure: BRONCHIAL BIOPSIES;  Surgeon: Omar Person, MD;  Location: [facility] ENDOSCOPY;  Service: Pulmonary;;   BRONCHIAL BRUSHINGS  05/28/2022   Procedure: BRONCHIAL BRUSHINGS;  Surgeon: Omar Person, MD;  Location: [facility] ENDOSCOPY;  Service: Pulmonary;;   BRONCHIAL NEEDLE ASPIRATION BIOPSY  05/28/2022   Procedure: BRONCHIAL NEEDLE ASPIRATION BIOPSIES;  Surgeon: Omar Person, MD;  Location: [facility] ENDOSCOPY;  Service: Pulmonary;;   BRONCHIAL WASHINGS  05/28/2022   Procedure: BRONCHIAL WASHINGS;  Surgeon: Omar Person, MD;  Location[facility] ENDOSCOPY;  Service: Pulmonary;;   BRONCHOSCOPY  2011   DILATION AND CURETTAGE OF UTERUS     ESOPHAGOGASTRODUODENOSCOPY (EGD) WITH PROPOFOL N/A 07/29/2021   Procedure: ESOPHAGOGASTRODUODENOSCOPY (EGD) WITH PROPOFOL;  Surgeon: Dolores Frame, MD;  Location: [facility] ENDO SUITE;  Service: Gastroenterology;  Laterality: N/A;  1235 ASA 2   HEMOSTASIS CONTROL  05/28/2022   Procedure: HEMOSTASIS CONTROL;  Surgeon: Omar Person, MD;  Location: [facility] ENDOSCOPY;  Service: Pulmonary;;   IR IMAGING GUIDED PORT INSERTION  08/12/2022   NECK SURGERY  1980's   THORACOTOMY Right 09/21/2013   Procedure: THORACOTOMY MAJOR;  Surgeon: Alleen Borne, MD;  Location: [facility] OR;  Service: Thoracic;  Laterality: Right;   UTERINE FIBROID SURGERY  2012   WEDGE RESECTION Right 09/21/2013   Procedure: RIGHT UPPER LOBE WEDGE RESECTION;  Surgeon: Alleen Borne, MD;  Location: [facility] OR;  Service: Thoracic;  Laterality: Right;    Current Outpatient Medications  Medication Sig Dispense Refill   Calcium Carbonate Antacid (TUMS PO) Take 500-1,000 mg by mouth 3 (three) times daily as needed (indigestion/heartburn.).     magnesium oxide (MAG-OX) 400 (240 Mg) MG  tablet TAKE 1 TABLET BY MOUTH DAILY 60 tablet 2   metoCLOPramide (REGLAN) 5 MG tablet Take 1 tablet (5 mg total) by mouth 3 (three) times daily before meals. 180 tablet 3   tretinoin (RETIN-A) 0.1 % cream Apply 1 application  topically at bedtime.     No current facility-administered medications for this visit.    Allergies as of 06/22/2023 - Review Complete 06/22/2023  Allergen Reaction Noted   Azithromycin Shortness Of Breath and Rash 04/09/2011   Tussionex pennkinetic er [hydrocod poli-chlorphe poli er] Shortness Of Breath and Rash 04/09/2011   Omeprazole Rash 11/13/2021    Social History   Socioeconomic History   Marital status: Married    Spouse name: Not on file   Number of children: Not on file   Years of education: Not on file   Highest education level: Not on file  Occupational History   Occupation: clerical    Employer: [employer]  Tobacco Use   Smoking status: Former    Current packs/day: 0.00    Average packs/day: 1 pack/day for 35.0 years (35.0 ttl pk-yrs)  Types: Cigarettes    Start date: 03/30/1972    Quit date: 03/31/2007    Years since quitting: 16.2    Passive exposure: Past   Smokeless tobacco: Never  Vaping Use   Vaping status: Never Used  Substance and Sexual Activity   Alcohol use: Yes    Comment: occassional about 3 drinks per year   Drug use: No   Sexual activity: Never  Other Topics Concern   Not on file  Social History Narrative   Not on file   Social Drivers of Health   Financial Resource Strain: Not on file  Food Insecurity: No Food Insecurity (08/06/2022)   Hunger Vital Sign    Worried About Running Out of Food in the Last Year: Never true    Ran Out of Food in the Last Year: Never true  Transportation Needs: No Transportation Needs (08/06/2022)   PRAPARE - Administrator, Civil Service (Medical): No    Lack of Transportation (Non-Medical): No  Physical Activity: Not on file  Stress: Not on file  Social Connections: Not  on file    Review of systems General: negative for malaise, night sweats, fever, chills Neck: Negative for lumps, goiter, pain and significant neck swelling Resp: Negative for cough, wheezing, dyspnea at rest CV: Negative for chest pain, leg swelling, palpitations, orthopnea GI: denies melena, hematochezia, nausea, vomiting, diarrhea, dysphagia, odyonophagia, early satiety or unintentional weight loss. +constipation +regurgitation  MSK: Negative for joint pain or swelling, back pain, and muscle pain. Derm: Negative for itching or rash Psych: Denies depression, anxiety, memory loss, confusion. No homicidal or suicidal ideation.  Heme: Negative for prolonged bleeding, bruising easily, and swollen nodes. Endocrine: Negative for cold or heat intolerance, polyuria, polydipsia and goiter. Neuro: negative for tremor, gait imbalance, syncope and seizures. The remainder of the review of systems is noncontributory.  Physical Exam: There were no vitals taken for this visit. General:   Alert and oriented. No distress noted. Pleasant and cooperative. cachectic Head:  Normocephalic and atraumatic. Eyes:  Conjuctiva clear without scleral icterus. Mouth:  Oral mucosa pink and moist. Good dentition. No lesions. Heart: Normal rate and rhythm, s1 and s2 heart sounds present.  Lungs: Clear lung sounds in all lobes. Respirations equal and unlabored. Abdomen:  +BS, soft, non-tender and non-distended. No rebound or guarding. No HSM or masses noted. Derm: No palmar erythema or jaundice Msk:  Symmetrical without gross deformities. Normal posture. Extremities:  Without edema. Neurologic:  Alert and  oriented x4 Psych:  Alert and cooperative. Normal mood and affect.  Invalid input(s): "6 MONTHS"   ASSESSMENT: Claudia [name] is a 78 y.o. female presenting today for follow up of gastroparesis, esophageal dysmotility and weight loss  Patient denies issues with swallowing currently. She is doing about the  same as at her last visit. Able to eat maybe one good meal per day, usually in the evening though appetite is not great. She saw Dr. Margaretha Glassing at [facility] and was recommended to start Reglan grams 3 times daily though she notes she is only taking Reglan in the evenings prior to dinner.  She is usually having 1 smaller meal midday, trying to do maybe 1 protein shake per day.  Her weight is 85 pounds today, she has been around this weight since November, fortunately has not lost much more weight though gaining has been difficult.  She is also having some constipation which I suspect is related to low food intake and increased water intake she is  not able to drink much liquids.  I recommend she increase her Reglan to twice daily dosing, aim for higher protein breakfast with [place] yogurt or protein shake and high-protein midday snack at least if she is not able to eat a meal.  Will continue with stool softener for now.  She has a follow-up with Dr. Margaretha Glassing next month, will await further recommendations from him though I did discuss with her we may consider trial of prucalopride in the future which may benefit both her constipation and gastroparesis.   PLAN:  -try increasing reglan to BID -aim for higher protein breakfast with greek yogurt or protein shake and high protein snack mid day -liberalize diet  -follow recommendations of Dr. Margaretha Glassing (follow up in April) -could consider use of prucalopride to help with both constipation and gastroparesis   All questions were answered, patient verbalized understanding and is in agreement with plan as outlined above.   Follow Up: 3 months   Dorsey Charette L. Jeanmarie Hubert, MSN, APRN, AGNP-C Adult-Gerontology Nurse Practitioner [facility]  I have reviewed the note and agree with the APP's assessment as described in this progress note  Katrinka Blazing, MD Gastroenterology and Hepatology [facility] Gastroenterology

## 2023-06-23 ENCOUNTER — Other Ambulatory Visit: Payer: Self-pay

## 2023-07-05 ENCOUNTER — Inpatient Hospital Stay: Payer: Medicare PPO

## 2023-07-05 ENCOUNTER — Inpatient Hospital Stay: Payer: Medicare PPO | Attending: Nurse Practitioner

## 2023-07-05 ENCOUNTER — Inpatient Hospital Stay: Payer: Medicare PPO | Admitting: Hematology

## 2023-07-05 ENCOUNTER — Encounter: Payer: Self-pay | Admitting: Hematology

## 2023-07-05 VITALS — BP 144/44 | HR 73 | Temp 96.7°F | Resp 18

## 2023-07-05 VITALS — BP 99/55 | HR 91 | Resp 18 | Wt 84.0 lb

## 2023-07-05 DIAGNOSIS — C3411 Malignant neoplasm of upper lobe, right bronchus or lung: Secondary | ICD-10-CM

## 2023-07-05 DIAGNOSIS — Z95828 Presence of other vascular implants and grafts: Secondary | ICD-10-CM

## 2023-07-05 DIAGNOSIS — Z79620 Long term (current) use of immunosuppressive biologic: Secondary | ICD-10-CM | POA: Insufficient documentation

## 2023-07-05 DIAGNOSIS — Z5112 Encounter for antineoplastic immunotherapy: Secondary | ICD-10-CM | POA: Diagnosis not present

## 2023-07-05 LAB — MAGNESIUM: Magnesium: 1.6 mg/dL — ABNORMAL LOW (ref 1.7–2.4)

## 2023-07-05 LAB — CBC WITH DIFFERENTIAL/PLATELET
Abs Immature Granulocytes: 0.05 K/uL (ref 0.00–0.07)
Basophils Absolute: 0 K/uL (ref 0.0–0.1)
Basophils Relative: 1 %
Eosinophils Absolute: 0 K/uL (ref 0.0–0.5)
Eosinophils Relative: 0 %
HCT: 30.9 % — ABNORMAL LOW (ref 36.0–46.0)
Hemoglobin: 10.3 g/dL — ABNORMAL LOW (ref 12.0–15.0)
Immature Granulocytes: 1 %
Lymphocytes Relative: 9 %
Lymphs Abs: 0.4 K/uL — ABNORMAL LOW (ref 0.7–4.0)
MCH: 33.4 pg (ref 26.0–34.0)
MCHC: 33.3 g/dL (ref 30.0–36.0)
MCV: 100.3 fL — ABNORMAL HIGH (ref 80.0–100.0)
Monocytes Absolute: 0.8 K/uL (ref 0.1–1.0)
Monocytes Relative: 16 %
Neutro Abs: 3.6 K/uL (ref 1.7–7.7)
Neutrophils Relative %: 73 %
Platelets: 114 K/uL — ABNORMAL LOW (ref 150–400)
RBC: 3.08 MIL/uL — ABNORMAL LOW (ref 3.87–5.11)
RDW: 14.5 % (ref 11.5–15.5)
WBC: 4.9 K/uL (ref 4.0–10.5)
nRBC: 0 % (ref 0.0–0.2)

## 2023-07-05 LAB — COMPREHENSIVE METABOLIC PANEL WITH GFR
ALT: 7 U/L (ref 0–44)
AST: 15 U/L (ref 15–41)
Albumin: 3.3 g/dL — ABNORMAL LOW (ref 3.5–5.0)
Alkaline Phosphatase: 57 U/L (ref 38–126)
Anion gap: 8 (ref 5–15)
BUN: 18 mg/dL (ref 8–23)
CO2: 27 mmol/L (ref 22–32)
Calcium: 9.7 mg/dL (ref 8.9–10.3)
Chloride: 101 mmol/L (ref 98–111)
Creatinine, Ser: 0.78 mg/dL (ref 0.44–1.00)
GFR, Estimated: >60 mL/min
Glucose, Bld: 106 mg/dL — ABNORMAL HIGH (ref 70–99)
Potassium: 3.8 mmol/L (ref 3.5–5.1)
Sodium: 136 mmol/L (ref 135–145)
Total Bilirubin: 0.8 mg/dL (ref 0.0–1.2)
Total Protein: 6.3 g/dL — ABNORMAL LOW (ref 6.5–8.1)

## 2023-07-05 MED ORDER — SODIUM CHLORIDE 0.9 % IV SOLN
1500.0000 mg | Freq: Once | INTRAVENOUS | Status: AC
  Administered 2023-07-05: 1500 mg via INTRAVENOUS
  Filled 2023-07-05: qty 30

## 2023-07-05 MED ORDER — HEPARIN SOD (PORK) LOCK FLUSH 100 UNIT/ML IV SOLN
500.0000 [IU] | Freq: Once | INTRAVENOUS | Status: AC | PRN
  Administered 2023-07-05: 500 [IU]

## 2023-07-05 MED ORDER — SODIUM CHLORIDE 0.9% FLUSH
10.0000 mL | INTRAVENOUS | Status: DC | PRN
  Administered 2023-07-05: 10 mL via INTRAVENOUS

## 2023-07-05 MED ORDER — SODIUM CHLORIDE 0.9% FLUSH
10.0000 mL | INTRAVENOUS | Status: DC | PRN
  Administered 2023-07-05: 10 mL

## 2023-07-05 MED ORDER — MAGNESIUM SULFATE 2 GM/50ML IV SOLN
2.0000 g | Freq: Once | INTRAVENOUS | Status: AC
  Administered 2023-07-05: 2 g via INTRAVENOUS
  Filled 2023-07-05: qty 50

## 2023-07-05 MED ORDER — SODIUM CHLORIDE 0.9 % IV SOLN: Freq: Once | INTRAVENOUS | Status: AC

## 2023-07-05 NOTE — Patient Instructions (Signed)
 [facility] - A DEPT OF [facility]  Discharge Instructions: Thank you for choosing [facility] to provide your oncology and hematology care.  If you have a lab appointment with the Cancer Center - please note that after April 8th, 2024, all labs will be drawn in the cancer center.  You do not have to check in or register with the main entrance as you have in the past but will complete your check-in in the cancer center.  Wear comfortable clothing and clothing appropriate for easy access to any Portacath or PICC line.   We strive to give you quality time with your provider. You may need to reschedule your appointment if you arrive late (15 or more minutes).  Arriving late affects you and other patients whose appointments are after yours.  Also, if you miss three or more appointments without notifying the office, you may be dismissed from the clinic at the provider's discretion.      For prescription refill requests, have your pharmacy contact our office and allow 72 hours for refills to be completed.    Today you received the following chemotherapy and/or immunotherapy agents Imfinzi.  Durvalumab Injection What is this medication? DURVALUMAB (dur VAL ue mab) treats some types of cancer. It works by helping your immune system slow or stop the spread of cancer cells. It is a monoclonal antibody. This medicine may be used for other purposes; ask your health care provider or pharmacist if you have questions. COMMON BRAND NAME(S): IMFINZI What should I tell my care team before I take this medication? They need to know if you have any of these conditions: Allogeneic stem cell transplant (uses someone else's stem cells) Autoimmune diseases, such as Crohn disease, ulcerative colitis, lupus History of chest radiation Nervous system problems, such as Guillain-Barre syndrome, myasthenia gravis Organ transplant An unusual or allergic reaction to durvalumab,  other medications, foods, dyes, or preservatives Pregnant or trying to get pregnant Breast-feeding How should I use this medication? This medication is infused into a vein. It is given by your care team in a hospital or clinic setting. A special MedGuide will be given to you before each treatment. Be sure to read this information carefully each time. Talk to your care team about the use of this medication in children. Special care may be needed. Overdosage: If you think you have taken too much of this medicine contact a poison control center or emergency room at once. NOTE: This medicine is only for you. Do not share this medicine with others. What if I miss a dose? Keep appointments for follow-up doses. It is important not to miss your dose. Call your care team if you are unable to keep an appointment. What may interact with this medication? Interactions have not been studied. This list may not describe all possible interactions. Give your health care provider a list of all the medicines, herbs, non-prescription drugs, or dietary supplements you use. Also tell them if you smoke, drink alcohol, or use illegal drugs. Some items may interact with your medicine. What should I watch for while using this medication? Your condition will be monitored carefully while you are receiving this medication. You may need blood work while taking this medication. This medication may cause serious skin reactions. They can happen weeks to months after starting the medication. Contact your care team right away if you notice fevers or flu-like symptoms with a rash. The rash may be red or  purple and then turn into blisters or peeling of the skin. You may also notice a red rash with swelling of the face, lips, or lymph nodes in your neck or under your arms. Tell your care team right away if you have any change in your eyesight. Talk to your care team if you may be pregnant. Serious birth defects can occur if you take  this medication during pregnancy and for 3 months after the last dose. You will need a negative pregnancy test before starting this medication. Contraception is recommended while taking this medication and for 3 months after the last dose. Your care team can help you find the option that works for you. Do not breastfeed while taking this medication and for 3 months after the last dose. What side effects may I notice from receiving this medication? Side effects that you should report to your care team as soon as possible: Allergic reactions--skin rash, itching, hives, swelling of the face, lips, tongue, or throat Dry cough, shortness of breath or trouble breathing Eye pain, redness, irritation, or discharge with blurry or decreased vision Heart muscle inflammation--unusual weakness or fatigue, shortness of breath, chest pain, fast or irregular heartbeat, dizziness, swelling of the ankles, feet, or hands Hormone gland problems--headache, sensitivity to light, unusual weakness or fatigue, dizziness, fast or irregular heartbeat, increased sensitivity to cold or heat, excessive sweating, constipation, hair loss, increased thirst or amount of urine, tremors or shaking, irritability Infusion reactions--chest pain, shortness of breath or trouble breathing, feeling faint or lightheaded Kidney injury (glomerulonephritis)--decrease in the amount of urine, red or dark brown urine, foamy or bubbly urine, swelling of the ankles, hands, or feet Liver injury--right upper belly pain, loss of appetite, nausea, light-colored stool, dark yellow or brown urine, yellowing skin or eyes, unusual weakness or fatigue Pain, tingling, or numbness in the hands or feet, muscle weakness, change in vision, confusion or trouble speaking, loss of balance or coordination, trouble walking, seizures Rash, fever, and swollen lymph nodes Redness, blistering, peeling, or loosening of the skin, including inside the mouth Sudden or severe  stomach pain, bloody diarrhea, fever, nausea, vomiting Side effects that usually do not require medical attention (report these to your care team if they continue or are bothersome): Bone, joint, or muscle pain Diarrhea Fatigue Loss of appetite Nausea Skin rash This list may not describe all possible side effects. Call your doctor for medical advice about side effects. You may report side effects to FDA at 1-800-FDA-1088. Where should I keep my medication? This medication is given in a hospital or clinic. It will not be stored at home. NOTE: This sheet is a summary. It may not cover all possible information. If you have questions about this medicine, talk to your doctor, pharmacist, or health care provider.  2024 Elsevier/Gold Standard (2021-07-29 00:00:00)        To help prevent nausea and vomiting after your treatment, we encourage you to take your nausea medication as directed.  BELOW ARE SYMPTOMS THAT SHOULD BE REPORTED IMMEDIATELY: *FEVER GREATER THAN 100.4 F (38 C) OR HIGHER *CHILLS OR SWEATING *NAUSEA AND VOMITING THAT IS NOT CONTROLLED WITH YOUR NAUSEA MEDICATION *UNUSUAL SHORTNESS OF BREATH *UNUSUAL BRUISING OR BLEEDING *URINARY PROBLEMS (pain or burning when urinating, or frequent urination) *BOWEL PROBLEMS (unusual diarrhea, constipation, pain near the anus) TENDERNESS IN MOUTH AND THROAT WITH OR WITHOUT PRESENCE OF ULCERS (sore throat, sores in mouth, or a toothache) UNUSUAL RASH, SWELLING OR PAIN  UNUSUAL VAGINAL DISCHARGE OR ITCHING   Items  with * indicate a potential emergency and should be followed up as soon as possible or go to the Emergency Department if any problems should occur.  Please show the CHEMOTHERAPY ALERT CARD or IMMUNOTHERAPY ALERT CARD at check-in to the Emergency Department and triage nurse.  Should you have questions after your visit or need to cancel or reschedule your appointment, please contact [facility] [place] - A DEPT OF Eligh[facility] Br[facility] [phone]  and follow the prompts.  Office hours are 8:00 a.m. to 4:30 p.m. Monday - Friday. Please note that voicemails left after 4:00 p.m. may not be returned until the following business day.  We are closed weekends and major holidays. You have access to a nurse at all times for urgent questions. Please call the main number to the clinic [phone] and follow the prompts.  For any non-urgent questions, you may also contact your provider using MyChart. We now offer e-Visits for anyone 68 and older to request care online for non-urgent symptoms. For details visit [email or web address].   Also download the MyChart app! Go to the app store, search "MyChart", open the app, select [place], and log in with your MyChart username and password.

## 2023-07-05 NOTE — Progress Notes (Signed)
 Patient presents today for chemotherapy infusion.  Patient is in satisfactory condition with no new complaints voiced.  Vital signs are stable.  Labs reviewed and all labs are within treatment parameters. Patient will receive 2g IV magnesium sulfate per Dr.Katragadda's standing orders. Okay to run magnesium over 30 minutes per pharmacy.  We will proceed with treatment per MD orders.

## 2023-07-05 NOTE — Progress Notes (Signed)
Patient tolerated treatment well with no complaints voiced.  Patient left ambulatory with husband in stable condition.  Vital signs stable at discharge.  Follow up as scheduled.

## 2023-07-16 ENCOUNTER — Other Ambulatory Visit: Payer: Self-pay

## 2023-07-22 DIAGNOSIS — K220 Achalasia of cardia: Secondary | ICD-10-CM | POA: Diagnosis not present

## 2023-07-31 IMAGING — NM NM GASTRIC EMPTYING
10 series · 10 of 10 positions shown · non-contrast
Comparison: PET-CT 08/30/2013. Abdominal CT 11/03/2010. Chest CT
04/02/2021.

CLINICAL DATA: Recurrent nausea and vomiting with early satiety.
Weight loss. Evaluate for gastroparesis.

EXAM:
NUCLEAR MEDICINE GASTRIC EMPTYING SCAN
TECHNIQUE: After oral ingestion of radiolabeled meal, sequential abdominal
images were obtained for 4 hours. Percentage of activity emptying
the stomach was calculated at 1 hour, 2 hour, 3 hour, and 4 hours.
RADIOPHARMACEUTICALS:  2.2 mCi Wc-OOm sulfur colloid in standardized
meal

[Series 1: 0 min · 4.14mm/px · 1 of 1 slices shown (1 of 4)]
[im 1/1]
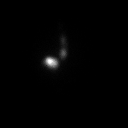

[Series 1: 0 min · 4.14mm/px · 1 of 1 slices shown (2 of 4)]
[im 1/1]
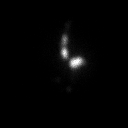

[Series 2: 60 min · 4.14mm/px · 1 of 1 slices shown (1 of 2)]
[im 1/1]
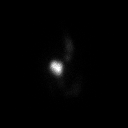

[Series 2: 60 min · 4.14mm/px · 1 of 1 slices shown (2 of 2)]
[im 1/1]
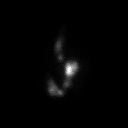

[Series 3: 120 min · 4.14mm/px · 1 of 1 slices shown (1 of 2)]
[im 1/1]
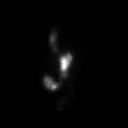

[Series 3: 120 min · 4.14mm/px · 1 of 1 slices shown (2 of 2)]
[im 1/1]
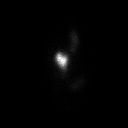

[Series 4: 180 min · 4.14mm/px · 1 of 1 slices shown (1 of 2)]
[im 1/1]
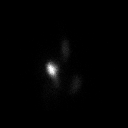

[Series 4: 180 min · 4.14mm/px · 1 of 1 slices shown (2 of 2)]
[im 1/1]
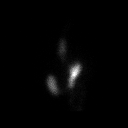

[Series 5: 0 min · 4.14mm/px · 1 of 1 slices shown (3 of 4)]
[im 1/1]
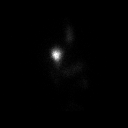

[Series 5: 0 min · 4.14mm/px · 1 of 1 slices shown (4 of 4)]
[im 1/1]
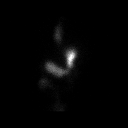

[10 of 10 positions shown; findings below may reference images not displayed]

FINDINGS: Initial images demonstrate ingested material in the distal esophagus
and proximal stomach. Subsequent imaging shows persistent esophageal
activity and limited gastric emptying. There is retained activity
within the stomach.

0% emptied at 1 hr ( normal >= 10%)

2.4% emptied at 2 hr ( normal >= 40%)

4.2% emptied at 3 hr ( normal >= 70%)

5% emptied at 4 hr ( normal >= 90%)
IMPRESSION: Significantly delayed gastric emptying. In addition, there is
apparent activity within the distal esophagus which may relate to
esophageal dysmotility and/or reflux. Consider further evaluation
with upper GI series.

## 2023-08-02 ENCOUNTER — Inpatient Hospital Stay: Admitting: Hematology

## 2023-08-02 ENCOUNTER — Inpatient Hospital Stay

## 2023-08-02 ENCOUNTER — Inpatient Hospital Stay: Attending: Nurse Practitioner

## 2023-08-02 ENCOUNTER — Encounter: Payer: Self-pay | Admitting: Hematology

## 2023-08-02 VITALS — BP 143/62 | HR 78 | Temp 98.0°F | Resp 18

## 2023-08-02 DIAGNOSIS — C3411 Malignant neoplasm of upper lobe, right bronchus or lung: Secondary | ICD-10-CM | POA: Diagnosis not present

## 2023-08-02 DIAGNOSIS — Z79620 Long term (current) use of immunosuppressive biologic: Secondary | ICD-10-CM | POA: Insufficient documentation

## 2023-08-02 DIAGNOSIS — Z5112 Encounter for antineoplastic immunotherapy: Secondary | ICD-10-CM | POA: Insufficient documentation

## 2023-08-02 LAB — COMPREHENSIVE METABOLIC PANEL WITH GFR
ALT: 7 U/L (ref 0–44)
AST: 14 U/L — ABNORMAL LOW (ref 15–41)
Albumin: 3.4 g/dL — ABNORMAL LOW (ref 3.5–5.0)
Alkaline Phosphatase: 63 U/L (ref 38–126)
Anion gap: 9 (ref 5–15)
BUN: 15 mg/dL (ref 8–23)
CO2: 27 mmol/L (ref 22–32)
Calcium: 9.7 mg/dL (ref 8.9–10.3)
Chloride: 101 mmol/L (ref 98–111)
Creatinine, Ser: 0.64 mg/dL (ref 0.44–1.00)
GFR, Estimated: >60 mL/min (ref >60)
Glucose, Bld: 95 mg/dL (ref 70–99)
Potassium: 4 mmol/L (ref 3.5–5.1)
Sodium: 137 mmol/L (ref 135–145)
Total Bilirubin: 0.6 mg/dL (ref 0.0–1.2)
Total Protein: 6.3 g/dL — ABNORMAL LOW (ref 6.5–8.1)

## 2023-08-02 LAB — CBC WITH DIFFERENTIAL/PLATELET
Abs Immature Granulocytes: 0.03 10*3/uL (ref 0.00–0.07)
Basophils Absolute: 0 10*3/uL (ref 0.0–0.1)
Basophils Relative: 1 %
Eosinophils Absolute: 0 10*3/uL (ref 0.0–0.5)
Eosinophils Relative: 1 %
HCT: 32.5 % — ABNORMAL LOW (ref 36.0–46.0)
Hemoglobin: 10.4 g/dL — ABNORMAL LOW (ref 12.0–15.0)
Immature Granulocytes: 1 %
Lymphocytes Relative: 10 %
Lymphs Abs: 0.4 10*3/uL — ABNORMAL LOW (ref 0.7–4.0)
MCH: 31.9 pg (ref 26.0–34.0)
MCHC: 32 g/dL (ref 30.0–36.0)
MCV: 99.7 fL (ref 80.0–100.0)
Monocytes Absolute: 0.7 10*3/uL (ref 0.1–1.0)
Monocytes Relative: 18 %
Neutro Abs: 2.5 10*3/uL (ref 1.7–7.7)
Neutrophils Relative %: 69 %
Platelets: 89 10*3/uL — ABNORMAL LOW (ref 150–400)
RBC: 3.26 MIL/uL — ABNORMAL LOW (ref 3.87–5.11)
RDW: 14.4 % (ref 11.5–15.5)
Smear Review: DECREASED
WBC: 3.6 10*3/uL — ABNORMAL LOW (ref 4.0–10.5)
nRBC: 0 % (ref 0.0–0.2)

## 2023-08-02 LAB — MAGNESIUM: Magnesium: 1.7 mg/dL (ref 1.7–2.4)

## 2023-08-02 MED ORDER — HEPARIN SOD (PORK) LOCK FLUSH 100 UNIT/ML IV SOLN
500.0000 [IU] | Freq: Once | INTRAVENOUS | Status: AC | PRN
Start: 2023-08-02 — End: 2023-08-02
  Administered 2023-08-02: 500 [IU]

## 2023-08-02 MED ORDER — SODIUM CHLORIDE 0.9% FLUSH
10.0000 mL | INTRAVENOUS | Status: DC | PRN
  Administered 2023-08-02: 10 mL

## 2023-08-02 MED ORDER — SODIUM CHLORIDE 0.9 % IV SOLN
1500.0000 mg | Freq: Once | INTRAVENOUS | Status: AC
Start: 2023-08-02 — End: 2023-08-02
  Administered 2023-08-02: 1500 mg via INTRAVENOUS
  Filled 2023-08-02: qty 30

## 2023-08-02 MED ORDER — SODIUM CHLORIDE 0.9 % IV SOLN
Freq: Once | INTRAVENOUS | Status: AC
Start: 2023-08-02 — End: 2023-08-02

## 2023-08-02 NOTE — Progress Notes (Signed)
 Patient presents today for Imfinzi  infusion. Patient is in satisfactory condition with no new complaints voiced.  Vital signs are stable.  Labs reviewed by Dr. Cheree Cords during the office visit and all other labs are within treatment parameters.Patient's platelets noted to be 89 today. We will proceed with treatment per MD orders.   Treatment given today per MD orders. Tolerated infusion without adverse affects. Vital signs stable. No complaints at this time. Discharged from clinic ambulatory in stable condition. Alert and oriented x 3. F/U with [facility] as scheduled.

## 2023-08-02 NOTE — Patient Instructions (Signed)
 [facility] - A DEPT OF [place] [facility]  Discharge Instructions: Thank you for choosing [facility] to provide your oncology and hematology care.  If you have a lab appointment with the Cancer Center - please note that after April 8th, 2024, all labs will be drawn in the cancer center.  You do not have to check in or register with the main entrance as you have in the past but will complete your check-in in the cancer center.  Wear comfortable clothing and clothing appropriate for easy access to any Portacath or PICC line.   We strive to give you quality time with your provider. You may need to reschedule your appointment if you arrive late (15 or more minutes).  Arriving late affects you and other patients whose appointments are after yours.  Also, if you miss three or more appointments without notifying the office, you may be dismissed from the clinic at the provider's discretion.      For prescription refill requests, have your pharmacy contact our office and allow 72 hours for refills to be completed.    Today you received the following chemotherapy and/or immunotherapy agents Imfnzi    To help prevent nausea and vomiting after your treatment, we encourage you to take your nausea medication as directed.  Durvalumab  Injection What is this medication? DURVALUMAB  (dur VAL ue mab) treats some types of cancer. It works by helping your immune system slow or stop the spread of cancer cells. It is a monoclonal antibody. This medicine may be used for other purposes; ask your health care provider or pharmacist if you have questions. COMMON BRAND NAME(S): IMFINZI  What should I tell my care team before I take this medication? They need to know if you have any of these conditions: Allogeneic stem cell transplant (uses someone else's stem cells) Autoimmune diseases, such as Crohn disease, ulcerative colitis, lupus History of chest radiation Nervous system  problems, such as Guillain-Barre syndrome, myasthenia gravis Organ transplant An unusual or allergic reaction to durvalumab , other medications, foods, dyes, or preservatives Pregnant or trying to get pregnant Breast-feeding How should I use this medication? This medication is infused into a vein. It is given by your care team in a hospital or clinic setting. A special MedGuide will be given to you before each treatment. Be sure to read this information carefully each time. Talk to your care team about the use of this medication in children. Special care may be needed. Overdosage: If you think you have taken too much of this medicine contact a poison control center or emergency room at once. NOTE: This medicine is only for you. Do not share this medicine with others. What if I miss a dose? Keep appointments for follow-up doses. It is important not to miss your dose. Call your care team if you are unable to keep an appointment. What may interact with this medication? Interactions have not been studied. This list may not describe all possible interactions. Give your health care provider a list of all the medicines, herbs, non-prescription drugs, or dietary supplements you use. Also tell them if you smoke, drink alcohol , or use illegal drugs. Some items may interact with your medicine. What should I watch for while using this medication? Your condition will be monitored carefully while you are receiving this medication. You may need blood work while taking this medication. This medication may cause serious skin reactions. They can happen weeks to months after starting the medication.  Contact your care team right away if you notice fevers or flu-like symptoms with a rash. The rash may be red or purple and then turn into blisters or peeling of the skin. You may also notice a red rash with swelling of the face, lips, or lymph nodes in your neck or under your arms. Tell your care team right away if you  have any change in your eyesight. Talk to your care team if you may be pregnant. Serious birth defects can occur if you take this medication during pregnancy and for 3 months after the last dose. You will need a negative pregnancy test before starting this medication. Contraception is recommended while taking this medication and for 3 months after the last dose. Your care team can help you find the option that works for you. Do not breastfeed while taking this medication and for 3 months after the last dose. What side effects may I notice from receiving this medication? Side effects that you should report to your care team as soon as possible: Allergic reactions--skin rash, itching, hives, swelling of the face, lips, tongue, or throat Dry cough, shortness of breath or trouble breathing Eye pain, redness, irritation, or discharge with blurry or decreased vision Heart muscle inflammation--unusual weakness or fatigue, shortness of breath, chest pain, fast or irregular heartbeat, dizziness, swelling of the ankles, feet, or hands Hormone gland problems--headache, sensitivity to light, unusual weakness or fatigue, dizziness, fast or irregular heartbeat, increased sensitivity to cold or heat, excessive sweating, constipation, hair loss, increased thirst or amount of urine, tremors or shaking, irritability Infusion reactions--chest pain, shortness of breath or trouble breathing, feeling faint or lightheaded Kidney injury (glomerulonephritis)--decrease in the amount of urine, red or dark brown urine, foamy or bubbly urine, swelling of the ankles, hands, or feet Liver injury--right upper belly pain, loss of appetite, nausea, light-colored stool, dark yellow or brown urine, yellowing skin or eyes, unusual weakness or fatigue Pain, tingling, or numbness in the hands or feet, muscle weakness, change in vision, confusion or trouble speaking, loss of balance or coordination, trouble walking, seizures Rash, fever, and  swollen lymph nodes Redness, blistering, peeling, or loosening of the skin, including inside the mouth Sudden or severe stomach pain, bloody diarrhea, fever, nausea, vomiting Side effects that usually do not require medical attention (report these to your care team if they continue or are bothersome): Bone, joint, or muscle pain Diarrhea Fatigue Loss of appetite Nausea Skin rash This list may not describe all possible side effects. Call your doctor for medical advice about side effects. You may report side effects to FDA at 1-800-FDA-1088. Where should I keep my medication? This medication is given in a hospital or clinic. It will not be stored at home. NOTE: This sheet is a summary. It may not cover all possible information. If you have questions about this medicine, talk to your doctor, pharmacist, or health care provider.  2024 Elsevier/Gold Standard (2021-07-29 00:00:00)  BELOW ARE SYMPTOMS THAT SHOULD BE REPORTED IMMEDIATELY: *FEVER GREATER THAN 100.4 F (38 C) OR HIGHER *CHILLS OR SWEATING *NAUSEA AND VOMITING THAT IS NOT CONTROLLED WITH YOUR NAUSEA MEDICATION *UNUSUAL SHORTNESS OF BREATH *UNUSUAL BRUISING OR BLEEDING *URINARY PROBLEMS (pain or burning when urinating, or frequent urination) *BOWEL PROBLEMS (unusual diarrhea, constipation, pain near the anus) TENDERNESS IN MOUTH AND THROAT WITH OR WITHOUT PRESENCE OF ULCERS (sore throat, sores in mouth, or a toothache) UNUSUAL RASH, SWELLING OR PAIN  UNUSUAL VAGINAL DISCHARGE OR ITCHING   Items with * indicate a  potential emergency and should be followed up as soon as possible or go to the Emergency Department if any problems should occur.  Please show the CHEMOTHERAPY ALERT CARD or IMMUNOTHERAPY ALERT CARD at check-in to the Emergency Department and triage nurse.  Should you have questions after your visit or need to cancel or reschedule your appointment, please contact [facility] [place] - A DEPT OF [facility] [phone]  and follow the prompts.  Office hours are 8:00 a.m. to 4:30 p.m. Monday - Friday. Please note that voicemails left after 4:00 p.m. may not be returned until the following business day.  We are closed weekends and major holidays. You have access to a nurse at all times for urgent questions. Please call the main number to the clinic [phone] and follow the prompts.  For any non-urgent questions, you may also contact your provider using MyChart. We now offer e-Visits for anyone 47 and older to request care online for non-urgent symptoms. For details visit [email or web address].   Also download the MyChart app! Go to the app store, search "MyChart", open the app, select [place], and log in with your MyChart username and password.

## 2023-08-02 NOTE — Patient Instructions (Addendum)

## 2023-08-02 NOTE — Progress Notes (Signed)
 Patient has been examined by Dr. Ellin Saba. Vital signs and labs have been reviewed by MD - ANC, Creatinine, LFTs, hemoglobin, and platelets are within treatment parameters per M.D. - pt may proceed with treatment.  Primary RN and pharmacy notified.

## 2023-08-02 NOTE — Progress Notes (Signed)
 [facility] [address], [place] [address]    Clinic Day:  08/02/2023  Referring physician: Orlena Bitters, MD  Patient Care Team: Orlena Bitters, MD as PCP - General (Internal Medicine) Elmyra Haggard, MD as PCP - Cardiology (Cardiology) Tami Falcon, MD as Consulting Physician (Gastroenterology) Paulett Boros, MD as Medical Oncologist (Medical Oncology) Gerhard Knuckles, RN as Oncology Nurse Navigator (Medical Oncology)   ASSESSMENT & PLAN:   Assessment: 1.  Recurrent extensive stage small cell lung cancer: - Patient seen at the request of Dr. Liam Redhead to get care closer to home. - 4 cycles of carboplatin  and etoposide  with concurrent radiation therapy for limited stage small cell lung cancer on 11/14/2009 - Status post PCI completed on 01/28/2010 - Wedge resection of the RUL by Dr. Ames Bakes on 09/20/2013, pathology consistent with small cell lung cancer. - 4 cycles of carboplatin  and etoposide  from 10/30/2013 through 01/08/2014 - PET scan on 04/23/2012: Marked hypermetabolism in the medial and anterior aspect of the left upper lobe.  Several hypermetabolic lung nodules in the left lung.  9 mm lesion in the left lower lobe.  No metastatic disease in the AP. - Bronchoscopy and FNA of the LUL nodule and LUL brushing and lavage with no malignant cells. - CT chest (08/03/2022): Scattered new nodular densities in the lungs bilaterally, largest in the left lower lobe.  New subcentimeter hypodense lesion in the left hepatic lobe 7 mm.  Periportal adenopathy 2.3 x 2.6 cm. - MRI of brain results from 08/14/2022: No metastatic disease.  Progression of small vessel ischemic disease. - PET scan from 08/13/2022 showed radiation changes in left upper lobe with focal hypermetabolism suggestive of residual recurrent tumor although this is improved from prior PET.  Dominant 8 mm subpleural nodule in the left lower lobe unchanged from recent CT and new from prior PET.  Metastatic disease  not excluded although infection/inflammation is also possible.  Prior hypermetabolic nodules on PET have resolved.  No evidence of metastatic disease in the abdomen or pelvis. - Cycle 1 of carboplatin /etoposide /durvalumab  on 08/17/2022, cycle 4 on 11/02/2022.  Maintenance durvalumab  started on 11/23/2022.   2.  Social/family history: - She moved back to [place] recently and lives with her husband.  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  She also worked at [employer] for 15 years.  Quit smoking in 2010 and smoked 1 pack/day for 30 years. - Mother had brain tumor.  Maternal aunt had breast cancer.    Plan: 1.  Recurrent extensive stage small cell lung cancer: -CT chest (05/02/2022): Stable bilateral lung nodules and posttreatment changes with no new findings. - She denies any immunotherapy related side effects.  However she has developed erythematous maculopapular rash on the trunk and thighs.  It is not clear if it is due to immunotherapy or omeprazole .  She will follow-up with dermatology. - Labs today: Normal LFTs and lites.  CBC with mild pancytopenia is stable.  Recommend proceeding with durvalumab  today.  RTC 4 weeks for follow-up.  Will repeat CT CAP with contrast prior to next visit.   2.  Gastroparesis: -She has episodes of regurgitation of food.  She is not gaining weight.  She is continuing to drink fair life 5 cans daily.  Continue Reglan  5 mg 3 times daily AC.   3.  Hypomagnesemia: -Continue magnesium  daily.   4.  Skin rash: -Reported erythematous maculopapular blanching rash predominantly on the trunk and  thighs.  She was started on omeprazole  by Dr. Rowena Copa on 07/22/2023, she took 2 pills and had rash and she stopped the pills.  She does not report any itching at this time.  Rash on the upper back initially had itching.  She has used cream given by her dermatologist. - I have recommended that she follow-up with dermatology within the next 1 to 2 weeks.     No orders of the defined types were placed in this encounter.     Paulett Boros, MD   5/5/202512:52 PM  CHIEF COMPLAINT:   Diagnosis: recurrent small cell lung cancer    Cancer Staging  Small cell lung cancer (HCC) Staging form: Lung, AJCC 7th Edition - Clinical: Limited Stage Small cell Lung Cancer - Signed by Marlene Simas, MD on 05/10/2013    Prior Therapy: 1. Concurrent chemoradiation with carboplatin  and etoposide , completed 11/14/2009.  2. Prophylactic cranial irradiation, completed 01/28/2010. 3. RUL wedge resection on 09/21/2013 (Dr. Sherene Dilling) 4. 4 cycles of carboplatin  and etoposide , 10/30/2013 - 01/10/2014  Current Therapy:  Carboplatin , etoposide  and durvalumab     HISTORY OF PRESENT ILLNESS:   Oncology History  Small cell lung cancer (HCC)  04/10/2011 Initial Diagnosis   Small cell lung cancer (HCC)   08/17/2022 -  Chemotherapy   Patient is on Treatment Plan : LUNG SMALL CELL EXTENSIVE STAGE Durvalumab  + Carboplatin  D1 + Etoposide  D1-3 q21d x 4 Cycles / Durvalumab  q28d        INTERVAL HISTORY:   Claudia [name] is a 78 y.o. female seen for follow-up of recurrent small cell lung cancer.  She is receiving Durvalumab  every 4 weeks.  Reports appetite 25%.  Energy levels are 75%.  PAST MEDICAL HISTORY:   Past Medical History: Past Medical History:  Diagnosis Date   Achalasia    Anemia    Arthritis    Bradycardia    mild,may be due to beta blocker therapy   COPD (chronic obstructive pulmonary disease) (HCC)    Gastroparesis    GERD (gastroesophageal reflux disease)    otc   Hypertension 06/01/2019   pt denies   Local recurrence of lung cancer (HCC) dx'd 07/2013   rt thoracotomy chemo comp 12/2013   Lung cancer (HCC) 03/30/2008   Dr. Marguerita Shih, finished chemo, sp radiation, left upper    Lymphocytic colitis    Pneumonia     Surgical History: Past Surgical History:  Procedure Laterality Date   BRONCHIAL BIOPSY  05/28/2022   Procedure: BRONCHIAL BIOPSIES;   Surgeon: Gloriajean Large, MD;  Location: [facility] ENDOSCOPY;  Service: Pulmonary;;   BRONCHIAL BRUSHINGS  05/28/2022   Procedure: BRONCHIAL BRUSHINGS;  Surgeon: Gloriajean Large, MD;  Location: [facility] ENDOSCOPY;  Service: Pulmonary;;   BRONCHIAL NEEDLE ASPIRATION BIOPSY  05/28/2022   Procedure: BRONCHIAL NEEDLE ASPIRATION BIOPSIES;  Surgeon: Gloriajean Large, MD;  Location: [facility] ENDOSCOPY;  Service: Pulmonary;;   BRONCHIAL WASHINGS  05/28/2022   Procedure: BRONCHIAL WASHINGS;  Surgeon: Gloriajean Large, MD;  Location: [facility] ENDOSCOPY;  Service: Pulmonary;;   BRONCHOSCOPY  2011   DILATION AND CURETTAGE OF UTERUS     ESOPHAGOGASTRODUODENOSCOPY (EGD) WITH PROPOFOL  N/A 07/29/2021   Procedure: ESOPHAGOGASTRODUODENOSCOPY (EGD) WITH PROPOFOL ;  Surgeon: Urban Garden, MD;  Location: [facility] ENDO SUITE;  Service: Gastroenterology;  Laterality: N/A;  1235 ASA 2   HEMOSTASIS CONTROL  05/28/2022   Procedure: HEMOSTASIS CONTROL;  Surgeon: Gloriajean Large, MD;  Location: [facility] ENDOSCOPY;  Service: Pulmonary;;   IR IMAGING GUIDED PORT INSERTION  08/12/2022  NECK SURGERY  1980's   THORACOTOMY Right 09/21/2013   Procedure: THORACOTOMY MAJOR;  Surgeon: Bartley Lightning, MD;  Location: [facility] OR;  Service: Thoracic;  Laterality: Right;   UTERINE FIBROID SURGERY  2012   WEDGE RESECTION Right 09/21/2013   Procedure: RIGHT UPPER LOBE WEDGE RESECTION;  Surgeon: Bartley Lightning, MD;  Location: [facility] OR;  Service: Thoracic;  Laterality: Right;    Social History: Social History   Socioeconomic History   Marital status: Married    Spouse name: Not on file   Number of children: Not on file   Years of education: Not on file   Highest education level: Not on file  Occupational History   Occupation: clerical    Employer: [employer]  Tobacco Use   Smoking status: Former    Current packs/day: 0.00    Average packs/day: 1 pack/day for 35.0 years (35.0 ttl pk-yrs)    Types: Cigarettes    Start date: 03/30/1972    Quit date:  03/31/2007    Years since quitting: 16.3    Passive exposure: Past   Smokeless tobacco: Never  Vaping Use   Vaping status: Never Used  Substance and Sexual Activity   Alcohol  use: Yes    Comment: occassional about 3 drinks per year   Drug use: No   Sexual activity: Never  Other Topics Concern   Not on file  Social History Narrative   Not on file   Social Drivers of Health   Financial Resource Strain: Not on file  Food Insecurity: No Food Insecurity (08/06/2022)   Hunger Vital Sign    Worried About Running Out of Food in the Last Year: Never true    Ran Out of Food in the Last Year: Never true  Transportation Needs: No Transportation Needs (08/06/2022)   PRAPARE - Administrator, Civil Service (Medical): No    Lack of Transportation (Non-Medical): No  Physical Activity: Not on file  Stress: Not on file  Social Connections: Not on file  Intimate Partner Violence: Not At Risk (08/06/2022)   Humiliation, Afraid, Rape, and Kick questionnaire    Fear of Current or Ex-Partner: No    Emotionally Abused: No    Physically Abused: No    Sexually Abused: No    Family History: Family History  Problem Relation Age of Onset   Brain cancer Mother        brain cancer   Emphysema Father    Diabetes Son    Heart disease Paternal Grandfather    Breast cancer Maternal Aunt     Current Medications:  Current Outpatient Medications:    Calcium  Carbonate Antacid (TUMS PO), Take 500-1,000 mg by mouth 3 (three) times daily as needed (indigestion/heartburn.)., Disp: , Rfl:    magnesium  oxide (MAG-OX) 400 (240 Mg) MG tablet, TAKE 1 TABLET BY MOUTH DAILY, Disp: 60 tablet, Rfl: 2   metoCLOPramide  (REGLAN ) 5 MG tablet, Take 1 tablet (5 mg total) by mouth 3 (three) times daily before meals., Disp: 180 tablet, Rfl: 3   tretinoin (RETIN-A) 0.1 % cream, Apply 1 application  topically at bedtime., Disp: , Rfl:    Allergies: Allergies  Allergen Reactions   Azithromycin Shortness Of Breath  and Rash    Took Tussionex at same time Jan 2013.   Tussionex Pennkinetic Er [Hydrocod Poli-Chlorphe Poli Er] Shortness Of Breath and Rash    Took Azithromycin at same time Jan 2013   Omeprazole  Rash    REVIEW OF SYSTEMS:  Review of Systems  Constitutional:  Negative for chills, fatigue and fever.  HENT:   Negative for lump/mass, mouth sores, nosebleeds, sore throat and trouble swallowing.   Eyes:  Negative for eye problems.  Respiratory:  Negative for cough and shortness of breath.   Cardiovascular:  Negative for chest pain, leg swelling and palpitations.  Gastrointestinal:  Positive for nausea. Negative for abdominal pain, diarrhea and vomiting.  Genitourinary:  Negative for bladder incontinence, difficulty urinating, dysuria, frequency, hematuria and nocturia.   Musculoskeletal:  Negative for arthralgias, back pain, flank pain, myalgias and neck pain.  Skin:  Negative for itching and rash.  Neurological:  Negative for dizziness, headaches and numbness.  Hematological:  Does not bruise/bleed easily.  Psychiatric/Behavioral:  Negative for depression, sleep disturbance and suicidal ideas. The patient is not nervous/anxious.   All other systems reviewed and are negative.    VITALS:   There were no vitals taken for this visit.  Wt Readings from Last 3 Encounters:  08/02/23 85 lb 1.6 oz (38.6 kg)  07/05/23 83 lb 15.9 oz (38.1 kg)  06/22/23 85 lb 1.6 oz (38.6 kg)    There is no height or weight on file to calculate BMI.  Performance status (ECOG): 1 - Symptomatic but completely ambulatory  PHYSICAL EXAM:   Physical Exam Vitals and nursing note reviewed. Exam conducted with a chaperone present.  Constitutional:      Appearance: Normal appearance.  Cardiovascular:     Rate and Rhythm: Normal rate and regular rhythm.     Pulses: Normal pulses.     Heart sounds: Normal heart sounds.  Pulmonary:     Effort: Pulmonary effort is normal.     Breath sounds: Normal breath sounds.   Abdominal:     Palpations: Abdomen is soft. There is no hepatomegaly, splenomegaly or mass.     Tenderness: There is no abdominal tenderness.  Musculoskeletal:     Right lower leg: No edema.     Left lower leg: No edema.  Lymphadenopathy:     Cervical: No cervical adenopathy.     Right cervical: No superficial, deep or posterior cervical adenopathy.    Left cervical: No superficial, deep or posterior cervical adenopathy.     Upper Body:     Right upper body: No supraclavicular or axillary adenopathy.     Left upper body: No supraclavicular or axillary adenopathy.  Neurological:     General: No focal deficit present.     Mental Status: She is alert and oriented to person, place, and time.  Psychiatric:        Mood and Affect: Mood normal.        Behavior: Behavior normal.     LABS:      Latest Ref Rng & Units 08/02/2023   11:39 AM 07/05/2023   12:23 PM 06/07/2023   11:37 AM  CBC  WBC 4.0 - 10.5 K/uL 3.6  4.9  3.2   Hemoglobin 12.0 - 15.0 g/dL 21.3  08.6  57.8   Hematocrit 36.0 - 46.0 % 32.5  30.9  33.8   Platelets 150 - 400 K/uL 89  114  112       Latest Ref Rng & Units 08/02/2023   11:39 AM 07/05/2023   12:23 PM 06/07/2023   11:37 AM  CMP  Glucose 70 - 99 mg/dL 95  469  93   BUN 8 - 23 mg/dL 15  18  15    Creatinine 0.44 - 1.00 mg/dL 6.29  5.28  0.75   Sodium 135 - 145 mmol/L 137  136  138   Potassium 3.5 - 5.1 mmol/L 4.0  3.8  3.5   Chloride 98 - 111 mmol/L 101  101  102   CO2 22 - 32 mmol/L 27  27  28    Calcium  8.9 - 10.3 mg/dL 9.7  9.7  9.8   Total Protein 6.5 - 8.1 g/dL 6.3  6.3  6.6   Total Bilirubin 0.0 - 1.2 mg/dL 0.6  0.8  0.6   Alkaline Phos 38 - 126 U/L 63  57  63   AST 15 - 41 U/L 14  15  15    ALT 0 - 44 U/L 7  7  7       No results found for: "CEA1", "CEA" / No results found for: "CEA1", "CEA" No results found for: "PSA1" No results found for: "ZOX096" No results found for: "CAN125"  No results found for: "TOTALPROTELP", "ALBUMINELP", "A1GS", "A2GS",  "BETS", "BETA2SER", "GAMS", "MSPIKE", "SPEI" Lab Results  Component Value Date   TIBC 238 (L) 08/17/2022   TIBC 149 (L) 10/08/2021   TIBC 159 (L) 04/10/2011   FERRITIN 296 08/17/2022   FERRITIN 675 (H) 10/08/2021   FERRITIN 1,680 (H) 04/10/2011   IRONPCTSAT 21 08/17/2022   IRONPCTSAT 9 (L) 10/08/2021   IRONPCTSAT 18 (L) 04/10/2011   No results found for: "LDH"   STUDIES:   No results found.

## 2023-08-20 ENCOUNTER — Ambulatory Visit (HOSPITAL_COMMUNITY)
Admission: RE | Admit: 2023-08-20 | Discharge: 2023-08-20 | Disposition: A | Discharge status: Home / Self Care | Source: Ambulatory Visit | Attending: Hematology | Admitting: Hematology

## 2023-08-20 DIAGNOSIS — J439 Emphysema, unspecified: Secondary | ICD-10-CM | POA: Diagnosis not present

## 2023-08-20 DIAGNOSIS — N281 Cyst of kidney, acquired: Secondary | ICD-10-CM | POA: Diagnosis not present

## 2023-08-20 DIAGNOSIS — C3411 Malignant neoplasm of upper lobe, right bronchus or lung: Secondary | ICD-10-CM | POA: Insufficient documentation

## 2023-08-20 DIAGNOSIS — K7689 Other specified diseases of liver: Secondary | ICD-10-CM | POA: Diagnosis not present

## 2023-08-20 MED ORDER — IOHEXOL 300 MG/ML  SOLN
100.0000 mL | Freq: Once | INTRAMUSCULAR | Status: AC | PRN
  Administered 2023-08-20: 100 mL via INTRAVENOUS

## 2023-08-20 MED ORDER — IOHEXOL 9 MG/ML PO SOLN: 500.0000 mL | ORAL | Status: AC

## 2023-08-20 MED ORDER — IOHEXOL 9 MG/ML PO SOLN
500.0000 mL | ORAL | Status: AC
  Administered 2023-08-20: 500 mL via ORAL

## 2023-08-27 ENCOUNTER — Other Ambulatory Visit: Payer: Self-pay

## 2023-08-30 ENCOUNTER — Inpatient Hospital Stay: Admitting: Hematology

## 2023-08-30 ENCOUNTER — Inpatient Hospital Stay: Attending: Nurse Practitioner

## 2023-08-30 ENCOUNTER — Inpatient Hospital Stay

## 2023-08-30 ENCOUNTER — Encounter: Payer: Self-pay | Admitting: Hematology

## 2023-08-30 VITALS — BP 105/43 | HR 81 | Temp 97.9°F | Resp 17

## 2023-08-30 DIAGNOSIS — C3411 Malignant neoplasm of upper lobe, right bronchus or lung: Secondary | ICD-10-CM | POA: Diagnosis not present

## 2023-08-30 DIAGNOSIS — Z5112 Encounter for antineoplastic immunotherapy: Secondary | ICD-10-CM | POA: Diagnosis not present

## 2023-08-30 DIAGNOSIS — D508 Other iron deficiency anemias: Secondary | ICD-10-CM

## 2023-08-30 DIAGNOSIS — Z79620 Long term (current) use of immunosuppressive biologic: Secondary | ICD-10-CM | POA: Diagnosis not present

## 2023-08-30 LAB — CBC WITH DIFFERENTIAL/PLATELET
Abs Immature Granulocytes: 0.1 10*3/uL — ABNORMAL HIGH (ref 0.00–0.07)
Basophils Absolute: 0.1 10*3/uL (ref 0.0–0.1)
Basophils Relative: 1 %
Eosinophils Absolute: 0 10*3/uL (ref 0.0–0.5)
Eosinophils Relative: 0 %
HCT: 28.8 % — ABNORMAL LOW (ref 36.0–46.0)
Hemoglobin: 9.5 g/dL — ABNORMAL LOW (ref 12.0–15.0)
Immature Granulocytes: 1 %
Lymphocytes Relative: 5 %
Lymphs Abs: 0.4 10*3/uL — ABNORMAL LOW (ref 0.7–4.0)
MCH: 33.3 pg (ref 26.0–34.0)
MCHC: 33 g/dL (ref 30.0–36.0)
MCV: 101.1 fL — ABNORMAL HIGH (ref 80.0–100.0)
Monocytes Absolute: 1.1 10*3/uL — ABNORMAL HIGH (ref 0.1–1.0)
Monocytes Relative: 13 %
Neutro Abs: 6.5 10*3/uL (ref 1.7–7.7)
Neutrophils Relative %: 80 %
Platelets: 132 10*3/uL — ABNORMAL LOW (ref 150–400)
RBC: 2.85 MIL/uL — ABNORMAL LOW (ref 3.87–5.11)
RDW: 14.2 % (ref 11.5–15.5)
WBC: 8.2 10*3/uL (ref 4.0–10.5)
nRBC: 0 % (ref 0.0–0.2)

## 2023-08-30 LAB — COMPREHENSIVE METABOLIC PANEL WITH GFR
ALT: 7 U/L (ref 0–44)
AST: 13 U/L — ABNORMAL LOW (ref 15–41)
Albumin: 3.2 g/dL — ABNORMAL LOW (ref 3.5–5.0)
Alkaline Phosphatase: 64 U/L (ref 38–126)
Anion gap: 9 (ref 5–15)
BUN: 19 mg/dL (ref 8–23)
CO2: 26 mmol/L (ref 22–32)
Calcium: 9.3 mg/dL (ref 8.9–10.3)
Chloride: 101 mmol/L (ref 98–111)
Creatinine, Ser: 0.77 mg/dL (ref 0.44–1.00)
GFR, Estimated: >60 mL/min (ref >60)
Glucose, Bld: 104 mg/dL — ABNORMAL HIGH (ref 70–99)
Potassium: 4.3 mmol/L (ref 3.5–5.1)
Sodium: 136 mmol/L (ref 135–145)
Total Bilirubin: 0.3 mg/dL (ref 0.0–1.2)
Total Protein: 6.5 g/dL (ref 6.5–8.1)

## 2023-08-30 LAB — TSH: TSH: 1.648 u[IU]/mL (ref 0.350–4.500)

## 2023-08-30 LAB — MAGNESIUM: Magnesium: 1.7 mg/dL (ref 1.7–2.4)

## 2023-08-30 MED ORDER — SODIUM CHLORIDE 0.9% FLUSH
10.0000 mL | INTRAVENOUS | Status: DC | PRN
  Administered 2023-08-30: 10 mL

## 2023-08-30 MED ORDER — SODIUM CHLORIDE 0.9 % IV SOLN
1500.0000 mg | Freq: Once | INTRAVENOUS | Status: AC
  Administered 2023-08-30: 1500 mg via INTRAVENOUS
  Filled 2023-08-30: qty 30

## 2023-08-30 MED ORDER — HEPARIN SOD (PORK) LOCK FLUSH 100 UNIT/ML IV SOLN
500.0000 [IU] | Freq: Once | INTRAVENOUS | Status: AC | PRN
  Administered 2023-08-30: 500 [IU]

## 2023-08-30 MED ORDER — SODIUM CHLORIDE 0.9% FLUSH
10.0000 mL | Freq: Once | INTRAVENOUS | Status: AC
  Administered 2023-08-30: 10 mL via INTRAVENOUS

## 2023-08-30 MED ORDER — SODIUM CHLORIDE 0.9 % IV SOLN: Freq: Once | INTRAVENOUS | Status: AC

## 2023-08-30 NOTE — Progress Notes (Signed)
 [facility] [address] [place] [address]    Clinic Day:  08/30/2023  Referring physician: Orlena Bitters, MD  Patient Care Team: Orlena Bitters, MD as PCP - General (Internal Medicine) Elmyra Haggard, MD as PCP - Cardiology (Cardiology) Tami Falcon, MD as Consulting Physician (Gastroenterology) Paulett Boros, MD as Medical Oncologist (Medical Oncology) Gerhard Knuckles, RN as Oncology Nurse Navigator (Medical Oncology)   ASSESSMENT & PLAN:   Assessment: 1.  Recurrent extensive stage small cell lung cancer: - Patient seen at the request of Dr. Liam Redhead to get care closer to home. - 4 cycles of carboplatin  and etoposide  with concurrent radiation therapy for limited stage small cell lung cancer on 11/14/2009 - Status post PCI completed on 01/28/2010 - Wedge resection of the RUL by Dr. Ames Bakes on 09/20/2013, pathology consistent with small cell lung cancer. - 4 cycles of carboplatin  and etoposide  from 10/30/2013 through 01/08/2014 - PET scan on 04/23/2012: Marked hypermetabolism in the medial and anterior aspect of the left upper lobe.  Several hypermetabolic lung nodules in the left lung.  9 mm lesion in the left lower lobe.  No metastatic disease in the AP. - Bronchoscopy and FNA of the LUL nodule and LUL brushing and lavage with no malignant cells. - CT chest (08/03/2022): Scattered new nodular densities in the lungs bilaterally, largest in the left lower lobe.  New subcentimeter hypodense lesion in the left hepatic lobe 7 mm.  Periportal adenopathy 2.3 x 2.6 cm. - MRI of brain results from 08/14/2022: No metastatic disease.  Progression of small vessel ischemic disease. - PET scan from 08/13/2022 showed radiation changes in left upper lobe with focal hypermetabolism suggestive of residual recurrent tumor although this is improved from prior PET.  Dominant 8 mm subpleural nodule in the left lower lobe unchanged from recent CT and new from prior PET.  Metastatic disease  not excluded although infection/inflammation is also possible.  Prior hypermetabolic nodules on PET have resolved.  No evidence of metastatic disease in the abdomen or pelvis. - Cycle 1 of carboplatin /etoposide /durvalumab  on 08/17/2022, cycle 4 on 11/02/2022.  Maintenance durvalumab  started on 11/23/2022.   2.  Social/family history: - She moved back to [place] recently and lives with her husband.  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  She also worked at [employer] for 15 years.  Quit smoking in 2010 and smoked 1 pack/day for 30 years. - Mother had brain tumor.  Maternal aunt had breast cancer.    Plan: 1.  Recurrent extensive stage small cell lung cancer: - She denies any immunotherapy related side effects including cough, shortness of breath, diarrhea.  She has a rash which is stable to better. - CT CAP on 08/20/2023: Posttreatment changes in the left hilar/suprahilar region with wedge resection with no evidence of active malignancy. - Labs from 08/30/2023: TSH is 1.6.  CBC grossly normal with macrocytic anemia with hemoglobin 9.5.  LFTs normal with almond 3.2. - She will continue durvalumab  today.  RTC 4 weeks for follow-up.  Will check ferritin, iron panel, B12 and folic acid  at next visit.   2.  Gastroparesis: - She is unable to eat bread and milk as she spits them up.  She is drinking 1 can of fair life daily.  She is using Reglan  5 mg 3 times daily prior to meals. - She lost about 3 pounds since 06/22/2023.  She was told to increase fair life to 2  cans/day.   3.  Hypomagnesemia: - Continue magnesium  daily.  Magnesium  level is normal.   4.  Skin rash: - She reported erythematous maculopapular blanching rash predominantly on the trunk and thighs.  It started around the time omeprazole  was started by Dr. Rowena Copa on 07/22/2023.  She did not see her dermatologist as advised at last visit.  She reported the rash on the legs is fading.  She still has some erythematous  maculopapular rash on the upper back.  She does not itch.  Will closely monitor.    Orders Placed This Encounter  Procedures   Copper, serum    Standing Status:   Future    Expected Date:   09/27/2023    Expiration Date:   08/29/2024   Ferritin    Standing Status:   Future    Expected Date:   09/27/2023    Expiration Date:   08/29/2024   Vitamin B12    Standing Status:   Future    Expected Date:   09/27/2023    Expiration Date:   08/29/2024   Folate    Standing Status:   Future    Expected Date:   09/27/2023    Expiration Date:   08/29/2024   Iron and TIBC ([facility] ONLY)    Standing Status:   Future    Expected Date:   09/27/2023    Expiration Date:   08/29/2024     Hurman Maiden R Teague,acting as a scribe for Paulett Boros, MD.,have documented all relevant documentation on the behalf of Paulett Boros, MD,as directed by  Paulett Boros, MD while in the presence of Paulett Boros, MD.  I, Paulett Boros MD, have reviewed the above documentation for accuracy and completeness, and I agree with the above.    Paulett Boros, MD   6/2/20252:28 PM  CHIEF COMPLAINT:   Diagnosis: recurrent small cell lung cancer    Cancer Staging  Small cell lung cancer (HCC) Staging form: Lung, AJCC 7th Edition - Clinical: Limited Stage Small cell Lung Cancer - Signed by Marlene Simas, MD on 05/10/2013    Prior Therapy: 1. Concurrent chemoradiation with carboplatin  and etoposide , completed 11/14/2009.  2. Prophylactic cranial irradiation, completed 01/28/2010. 3. RUL wedge resection on 09/21/2013 (Dr. Sherene Dilling) 4. 4 cycles of carboplatin  and etoposide , 10/30/2013 - 01/10/2014  Current Therapy:  Carboplatin , etoposide  and durvalumab     HISTORY OF PRESENT ILLNESS:   Oncology History  Small cell lung cancer (HCC)  04/10/2011 Initial Diagnosis   Small cell lung cancer (HCC)   08/17/2022 -  Chemotherapy   Patient is on Treatment Plan : LUNG SMALL CELL  EXTENSIVE STAGE Durvalumab  + Carboplatin  D1 + Etoposide  D1-3 q21d x 4 Cycles / Durvalumab  q28d        INTERVAL HISTORY:   Claudia [name] is a 78 y.o. female seen for follow-up of recurrent small cell lung cancer. She was last seen by me on 08/02/23.  Since her last visit, she underwent CT CAP on 08/20/23 that found: Posttreatment changes in the left hilar/suprahilar region with wedge resection noted in the right lung with no convincing evidence for active malignancy within the chest, abdomen, or pelvis. New tree-in-bud nodularity in the right middle lobe favoring an infectious or inflammatory process.  Today, she states that she is doing well overall. Her appetite level is at 50%. Her energy level is at 60%.   PAST MEDICAL HISTORY:   Past Medical History: Past Medical History:  Diagnosis Date   Achalasia    Anemia  Arthritis    Bradycardia    mild,may be due to beta blocker therapy   COPD (chronic obstructive pulmonary disease) (HCC)    Gastroparesis    GERD (gastroesophageal reflux disease)    otc   Hypertension 06/01/2019   pt denies   Local recurrence of lung cancer (HCC) dx'd 07/2013   rt thoracotomy chemo comp 12/2013   Lung cancer (HCC) 03/30/2008   Dr. Marguerita Shih, finished chemo, sp radiation, left upper    Lymphocytic colitis    Pneumonia     Surgical History: Past Surgical History:  Procedure Laterality Date   BRONCHIAL BIOPSY  05/28/2022   Procedure: BRONCHIAL BIOPSIES;  Surgeon: Gloriajean Large, MD;  Location[facility] ENDOSCOPY;  Service: Pulmonary;;   BRONCHIAL BRUSHINGS  05/28/2022   Procedure: BRONCHIAL BRUSHINGS;  Surgeon: Gloriajean Large, MD;  Location: [facility] ENDOSCOPY;  Service: Pulmonary;;   BRONCHIAL NEEDLE ASPIRATION BIOPSY  05/28/2022   Procedure: BRONCHIAL NEEDLE ASPIRATION BIOPSIES;  Surgeon: Gloriajean Large, MD;  Location: [facility] ENDOSCOPY;  Service: Pulmonary;;   BRONCHIAL WASHINGS  05/28/2022   Procedure: BRONCHIAL WASHINGS;  Surgeon: Gloriajean Large, MD;   Location: [facility] ENDOSCOPY;  Service: Pulmonary;;   BRONCHOSCOPY  2011   DILATION AND CURETTAGE OF UTERUS     ESOPHAGOGASTRODUODENOSCOPY (EGD) WITH PROPOFOL  N/A 07/29/2021   Procedure: ESOPHAGOGASTRODUODENOSCOPY (EGD) WITH PROPOFOL ;  Surgeon: Urban Garden, MD;  Location: [facility] ENDO SUITE;  Service: Gastroenterology;  Laterality: N/A;  1235 ASA 2   HEMOSTASIS CONTROL  05/28/2022   Procedure: HEMOSTASIS CONTROL;  Surgeon: Gloriajean Large, MD;  Location: [facility] ENDOSCOPY;  Service: Pulmonary;;   IR IMAGING GUIDED PORT INSERTION  08/12/2022   NECK SURGERY  1980's   THORACOTOMY Right 09/21/2013   Procedure: THORACOTOMY MAJOR;  Surgeon: Bartley Lightning, MD;  Location: [facility] OR;  Service: Thoracic;  Laterality: Right;   UTERINE FIBROID SURGERY  2012   WEDGE RESECTION Right 09/21/2013   Procedure: RIGHT UPPER LOBE WEDGE RESECTION;  Surgeon: Bartley Lightning, MD;  Location: [facility] OR;  Service: Thoracic;  Laterality: Right;    Social History: Social History   Socioeconomic History   Marital status: Married    Spouse name: Not on file   Number of children: Not on file   Years of education: Not on file   Highest education level: Not on file  Occupational History   Occupation: clerical    Employer: [employer]  Tobacco Use   Smoking status: Former    Current packs/day: 0.00    Average packs/day: 1 pack/day for 35.0 years (35.0 ttl pk-yrs)    Types: Cigarettes    Start date: 03/30/1972    Quit date: 03/31/2007    Years since quitting: 16.4    Passive exposure: Past   Smokeless tobacco: Never  Vaping Use   Vaping status: Never Used  Substance and Sexual Activity   Alcohol  use: Yes    Comment: occassional about 3 drinks per year   Drug use: No   Sexual activity: Never  Other Topics Concern   Not on file  Social History Narrative   Not on file   Social Drivers of Health   Financial Resource Strain: Not on file  Food Insecurity: No Food Insecurity (08/06/2022)   Hunger Vital Sign    Worried  About Running Out of Food in the Last Year: Never true    Ran Out of Food in the Last Year: Never true  Transportation Needs: No Transportation Needs (08/06/2022)   PRAPARE -  Administrator, Civil Service (Medical): No    Lack of Transportation (Non-Medical): No  Physical Activity: Not on file  Stress: Not on file  Social Connections: Not on file  Intimate Partner Violence: Not At Risk (08/06/2022)   Humiliation, Afraid, Rape, and Kick questionnaire    Fear of Current or Ex-Partner: No    Emotionally Abused: No    Physically Abused: No    Sexually Abused: No    Family History: Family History  Problem Relation Age of Onset   Brain cancer Mother        brain cancer   Emphysema Father    Diabetes Son    Heart disease Paternal Grandfather    Breast cancer Maternal Aunt     Current Medications:  Current Outpatient Medications:    Calcium  Carbonate Antacid (TUMS PO), Take 500-1,000 mg by mouth 3 (three) times daily as needed (indigestion/heartburn.)., Disp: , Rfl:    magnesium  oxide (MAG-OX) 400 (240 Mg) MG tablet, TAKE 1 TABLET BY MOUTH DAILY, Disp: 60 tablet, Rfl: 2   metoCLOPramide  (REGLAN ) 5 MG tablet, Take 1 tablet (5 mg total) by mouth 3 (three) times daily before meals., Disp: 180 tablet, Rfl: 3   tretinoin (RETIN-A) 0.1 % cream, Apply 1 application  topically at bedtime., Disp: , Rfl:  No current facility-administered medications for this visit.  Facility-Administered Medications Ordered in Other Visits:    durvalumab  (IMFINZI ) 1,500 mg in sodium chloride  0.9 % 100 mL chemo infusion, 1,500 mg, Intravenous, Once, Paulett Boros, MD   heparin  lock flush 100 unit/mL, 500 Units, Intracatheter, Once PRN, Tomeka Kantner, MD   sodium chloride  flush (NS) 0.9 % injection 10 mL, 10 mL, Intracatheter, PRN, Emmalynne Courtney, MD   Allergies: Allergies  Allergen Reactions   Azithromycin Shortness Of Breath and Rash    Took Tussionex at same time Jan 2013.    Tussionex Pennkinetic Er [Hydrocod Poli-Chlorphe Poli Er] Shortness Of Breath and Rash    Took Azithromycin at same time Jan 2013   Omeprazole  Rash    REVIEW OF SYSTEMS:   Review of Systems  Constitutional:  Positive for fatigue. Negative for chills and fever.  HENT:   Negative for lump/mass, mouth sores, nosebleeds, sore throat and trouble swallowing.   Eyes:  Negative for eye problems.  Respiratory:  Negative for cough and shortness of breath.   Cardiovascular:  Negative for chest pain, leg swelling and palpitations.  Gastrointestinal:  Negative for abdominal pain, constipation, diarrhea, nausea and vomiting.  Genitourinary:  Negative for bladder incontinence, difficulty urinating, dysuria, frequency, hematuria and nocturia.   Musculoskeletal:  Negative for arthralgias, back pain, flank pain, myalgias and neck pain.  Skin:  Negative for itching and rash.  Neurological:  Negative for dizziness, headaches and numbness.  Hematological:  Does not bruise/bleed easily.  Psychiatric/Behavioral:  Negative for depression, sleep disturbance and suicidal ideas. The patient is not nervous/anxious.   All other systems reviewed and are negative.    VITALS:   There were no vitals taken for this visit.  Wt Readings from Last 3 Encounters:  08/30/23 82 lb 14.3 oz (37.6 kg)  08/02/23 85 lb 1.6 oz (38.6 kg)  07/05/23 83 lb 15.9 oz (38.1 kg)    There is no height or weight on file to calculate BMI.  Performance status (ECOG): 1 - Symptomatic but completely ambulatory  PHYSICAL EXAM:   Physical Exam Vitals and nursing note reviewed. Exam conducted with a chaperone present.  Constitutional:  Appearance: Normal appearance.  Cardiovascular:     Rate and Rhythm: Normal rate and regular rhythm.     Pulses: Normal pulses.     Heart sounds: Normal heart sounds.  Pulmonary:     Effort: Pulmonary effort is normal.     Breath sounds: Normal breath sounds.  Abdominal:     Palpations: Abdomen  is soft. There is no hepatomegaly, splenomegaly or mass.     Tenderness: There is no abdominal tenderness.  Musculoskeletal:     Right lower leg: No edema.     Left lower leg: No edema.  Lymphadenopathy:     Cervical: No cervical adenopathy.     Right cervical: No superficial, deep or posterior cervical adenopathy.    Left cervical: No superficial, deep or posterior cervical adenopathy.     Upper Body:     Right upper body: No supraclavicular or axillary adenopathy.     Left upper body: No supraclavicular or axillary adenopathy.  Neurological:     General: No focal deficit present.     Mental Status: She is alert and oriented to person, place, and time.  Psychiatric:        Mood and Affect: Mood normal.        Behavior: Behavior normal.     LABS:      Latest Ref Rng & Units 08/30/2023    1:12 PM 08/02/2023   11:39 AM 07/05/2023   12:23 PM  CBC  WBC 4.0 - 10.5 K/uL 8.2  3.6  4.9   Hemoglobin 12.0 - 15.0 g/dL 9.5  86.5  78.4   Hematocrit 36.0 - 46.0 % 28.8  32.5  30.9   Platelets 150 - 400 K/uL 132  89  114       Latest Ref Rng & Units 08/30/2023    1:08 PM 08/02/2023   11:39 AM 07/05/2023   12:23 PM  CMP  Glucose 70 - 99 mg/dL 696  95  295   BUN 8 - 23 mg/dL 19  15  18    Creatinine 0.44 - 1.00 mg/dL 2.84  1.32  4.40   Sodium 135 - 145 mmol/L 136  137  136   Potassium 3.5 - 5.1 mmol/L 4.3  4.0  3.8   Chloride 98 - 111 mmol/L 101  101  101   CO2 22 - 32 mmol/L 26  27  27    Calcium  8.9 - 10.3 mg/dL 9.3  9.7  9.7   Total Protein 6.5 - 8.1 g/dL 6.5  6.3  6.3   Total Bilirubin 0.0 - 1.2 mg/dL 0.3  0.6  0.8   Alkaline Phos 38 - 126 U/L 64  63  57   AST 15 - 41 U/L 13  14  15    ALT 0 - 44 U/L 7  7  7       No results found for: "CEA1", "CEA" / No results found for: "CEA1", "CEA" No results found for: "PSA1" No results found for: "NUU725" No results found for: "CAN125"  No results found for: "TOTALPROTELP", "ALBUMINELP", "A1GS", "A2GS", "BETS", "BETA2SER", "GAMS", "MSPIKE",  "SPEI" Lab Results  Component Value Date   TIBC 238 (L) 08/17/2022   TIBC 149 (L) 10/08/2021   TIBC 159 (L) 04/10/2011   FERRITIN 296 08/17/2022   FERRITIN 675 (H) 10/08/2021   FERRITIN 1,680 (H) 04/10/2011   IRONPCTSAT 21 08/17/2022   IRONPCTSAT 9 (L) 10/08/2021   IRONPCTSAT 18 (L) 04/10/2011   No results found for: "LDH"   STUDIES:  CT CHEST ABDOMEN PELVIS W CONTRAST Result Date: 08/21/2023 EXAMINATION: CT CHEST ABDOMEN PELVIS W CONTRAST CLINICAL INDICATION: Female, 78 years old. Metastatic disease evaluation TECHNIQUE: Axial CT of the chest, abdomen, and pelvis with 100 cc Omnipaque  300 intravenous contrast. Multiplanar reformations provided. Unless otherwise specified, incidental thyroid , adrenal, renal lesions do not require dedicated imaging follow up. Additionally, any mentioned pulmonary nodules do not require dedicated imaging follow-up based on the Fleischner guidelines unless otherwise specified. Coronary calcifications are not identified unless otherwise specified. COMPARISON: 03/09/2023 FINDINGS: CHEST: Right chest wall Mediport catheter tip terminates in the SVC. The visualized thyroid  is normal. The thoracic aorta is normal in caliber. Scattered atherosclerotic changes are present. The main pulmonary artery is normal in caliber. The heart is normal in size. The esophagus is patulous. There is no free fluid or adenopathy. The trachea and mainstem bronchi are patent. There is moderate centrilobular/paraseptal pulmonary emphysema. Similar posttreatment changes and volume loss noted in the left hilar and suprahilar regions with additional wedge resection noted in the right lung anteriorly. There are a few stable scattered pulmonary micronodules. There is new tree-in-bud nodularity noted anteromedially in the right middle lobe. ABDOMEN/PELVIS: The liver contains a stable small cyst in the left lobe. The gallbladder is normal. The spleen is normal. The pancreas is normal. The adrenals  are normal. Bilateral renal cysts are noted. Abdominal aorta is normal in caliber. Scattered atherosclerotic changes are present. The bladder is normal. The uterus is normal. Large and small bowel loops are otherwise within normal limits. No free fluid or adenopathy. BONES: No concerning osseous lesions are identified. There are degenerative changes of the spine and bony pelvis. IMPRESSION: Posttreatment changes in the left hilar/suprahilar region with wedge resection noted in the right lung with no convincing evidence for active malignancy within the chest, abdomen, or pelvis. New tree-in-bud nodularity in the right middle lobe favoring an infectious or inflammatory process. Attention on follow-up. DOSE REDUCTION: All CT scans are performed using radiation dose reduction techniques, when applicable. Technical factors are evaluated and adjusted to ensure appropriate moderation of exposure. Electronically signed by: Italy Engel MD 08/21/2023 09:37 AM EDT RP Workstation: [id number]

## 2023-08-30 NOTE — Patient Instructions (Signed)
 [facility] [place] - A DEPT OF [facility]  Discharge Instructions: Thank you for choosing [facility] to provide your oncology and hematology care.  If you have a lab appointment with the Cancer Center - please note that after April 8th, 2024, all labs will be drawn in the cancer center.  You do not have to check in or register with the main entrance as you have in the past but will complete your check-in in the cancer center.  Wear comfortable clothing and clothing appropriate for easy access to any Portacath or PICC line.   We strive to give you quality time with your provider. You may need to reschedule your appointment if you arrive late (15 or more minutes).  Arriving late affects you and other patients whose appointments are after yours.  Also, if you miss three or more appointments without notifying the office, you may be dismissed from the clinic at the provider's discretion.      For prescription refill requests, have your pharmacy contact our office and allow 72 hours for refills to be completed.    Today you received the following chemotherapy and/or immunotherapy agents imfinzi.       To help prevent nausea and vomiting after your treatment, we encourage you to take your nausea medication as directed.  BELOW ARE SYMPTOMS THAT SHOULD BE REPORTED IMMEDIATELY: *FEVER GREATER THAN 100.4 F (38 C) OR HIGHER *CHILLS OR SWEATING *NAUSEA AND VOMITING THAT IS NOT CONTROLLED WITH YOUR NAUSEA MEDICATION *UNUSUAL SHORTNESS OF BREATH *UNUSUAL BRUISING OR BLEEDING *URINARY PROBLEMS (pain or burning when urinating, or frequent urination) *BOWEL PROBLEMS (unusual diarrhea, constipation, pain near the anus) TENDERNESS IN MOUTH AND THROAT WITH OR WITHOUT PRESENCE OF ULCERS (sore throat, sores in mouth, or a toothache) UNUSUAL RASH, SWELLING OR PAIN  UNUSUAL VAGINAL DISCHARGE OR ITCHING   Items with * indicate a potential emergency and should be followed up  as soon as possible or go to the Emergency Department if any problems should occur.  Please show the CHEMOTHERAPY ALERT CARD or IMMUNOTHERAPY ALERT CARD at check-in to the Emergency Department and triage nurse.  Should you have questions after your visit or need to cancel or reschedule your appointment, please contact [facility] [place] - A DEPT OF [facility] [phone]  and follow the prompts.  Office hours are 8:00 a.m. to 4:30 p.m. Monday - Friday. Please note that voicemails left after 4:00 p.m. may not be returned until the following business day.  We are closed weekends and major holidays. You have access to a nurse at all times for urgent questions. Please call the main number to the clinic [phone] and follow the prompts.  For any non-urgent questions, you may also contact your provider using MyChart. We now offer e-Visits for anyone 45 and older to request care online for non-urgent symptoms. For details visit [email or web address].   Also download the MyChart app! Go to the app store, search "MyChart", open the app, select [place], and log in with your MyChart username and password.

## 2023-08-30 NOTE — Patient Instructions (Addendum)
 [facility] at [facility] Discharge Instructions   You were seen and examined today by Dr. Ellin Saba.  He reviewed the results of your lab work which are normal/stable.   He reviewed the results of your CT scan. There is no evidence of cancer on this exam.   We will proceed with your treatment today.   Return as scheduled.    Thank you for choosing [facility] at [facility] to provide your oncology and hematology care.  To afford each patient quality time with our provider, please arrive at least 15 minutes before your scheduled appointment time.   If you have a lab appointment with the Cancer Center please come in thru the Main Entrance and check in at the main information desk.  You need to re-schedule your appointment should you arrive 10 or more minutes late.  We strive to give you quality time with our providers, and arriving late affects you and other patients whose appointments are after yours.  Also, if you no show three or more times for appointments you may be dismissed from the clinic at the providers discretion.     Again, thank you for choosing [facility].  Our hope is that these requests will decrease the amount of time that you wait before being seen by our physicians.       _____________________________________________________________  Should you have questions after your visit to [facility], please contact our office at [phone] and follow the prompts.  Our office hours are 8:00 a.m. and 4:30 p.m. Monday - Friday.  Please note that voicemails left after 4:00 p.m. may not be returned until the following business day.  We are closed weekends and major holidays.  You do have access to a nurse 24-7, just call the main number to the clinic [phone] and do not press any options, hold on the line and a nurse will answer the phone.    For prescription refill requests, have your pharmacy contact our  office and allow 72 hours.    Due to Covid, you will need to wear a mask upon entering the hospital. If you do not have a mask, a mask will be given to you at the Main Entrance upon arrival. For doctor visits, patients may have 1 support person age 41 or older with them. For treatment visits, patients can not have anyone with them due to social distancing guidelines and our immunocompromised population.

## 2023-08-30 NOTE — Progress Notes (Signed)

## 2023-08-30 NOTE — Progress Notes (Signed)
 Patient presents today for treatment (Imfinzi ) and follow up appointment with Dr. Cheree Cords. Vital signs and labs within parameters for treatment. TSH 1.648 today.

## 2023-08-31 ENCOUNTER — Other Ambulatory Visit: Payer: Self-pay

## 2023-09-03 ENCOUNTER — Encounter (INDEPENDENT_AMBULATORY_CARE_PROVIDER_SITE_OTHER): Payer: Self-pay | Admitting: *Deleted

## 2023-09-18 NOTE — Progress Notes (Unsigned)
 Cardiology Office Note   Date:  09/24/2023   ID:  Claudia, [name] 04-28-1945, MRN [id number]  PCP:  Claudia Leta NOVAK, MD  Cardiologist:   Vina Gull, MD   Pt presents for follow up of atrial fibrillation     History of Present Illness: Claudia [name] is a 78 y.o. female with a history of atrial fibrillation, hx pericardial effusion, bradycardia (when on b blocker),  small cell lung CA (2011, recurrence 2015),  COPD and GERD, esophageal dysmotility    2023   Pt admitted for CP, atrial fibrillation, hypotension   Converted to SR  Placed on Multaq    Anticoagulation held due to drop in hemoglobin  Echo   LVEF 60 to 65%  Moderate pericardial effusion  Rx colchicine       Sept 2023 Limited echo  Pericaridal effusion improved  December 2023  Monitor showed Sinus rhythm  No atrial fibrillation.  PT off of Multacy  I saw the pt in December 2023   Since seen her breathing is OK   She denies palpitations   No CP   Breathing is OK   Current Meds  Medication Sig   Calcium  Carbonate Antacid (TUMS PO) Take 500-1,000 mg by mouth 3 (three) times daily as needed (indigestion/heartburn.).   magnesium  oxide (MAG-OX) 400 (240 Mg) MG tablet TAKE 1 TABLET BY MOUTH DAILY   metoCLOPramide  (REGLAN ) 5 MG tablet Take 1 tablet (5 mg total) by mouth 3 (three) times daily before meals.   tretinoin (RETIN-A) 0.1 % cream Apply 1 application  topically at bedtime.     Allergies:   Azithromycin, Tussionex pennkinetic er [hydrocod poli-chlorphe poli er], and Omeprazole    Past Medical History:  Diagnosis Date   Achalasia    Anemia    Arthritis    Bradycardia    mild,may be due to beta blocker therapy   COPD (chronic obstructive pulmonary disease) (HCC)    Gastroparesis    GERD (gastroesophageal reflux disease)    otc   Hypertension 06/01/2019   pt denies   Local recurrence of lung cancer (HCC) dx'd 07/2013   rt thoracotomy chemo comp 12/2013   Lung cancer (HCC) 03/30/2008   Dr. Sherrod, finished  chemo, sp radiation, left upper    Lymphocytic colitis    Pneumonia     Past Surgical History:  Procedure Laterality Date   BRONCHIAL BIOPSY  05/28/2022   Procedure: BRONCHIAL BIOPSIES;  Surgeon: Gladis Leonor HERO, MD;  Location: [facility] ENDOSCOPY;  Service: Pulmonary;;   BRONCHIAL BRUSHINGS  05/28/2022   Procedure: BRONCHIAL BRUSHINGS;  Surgeon: Gladis Leonor HERO, MD;  Location: [facility] ENDOSCOPY;  Service: Pulmonary;;   BRONCHIAL NEEDLE ASPIRATION BIOPSY  05/28/2022   Procedure: BRONCHIAL NEEDLE ASPIRATION BIOPSIES;  Surgeon: Gladis Leonor HERO, MD;  Location: [facility] ENDOSCOPY;  Service: Pulmonary;;   BRONCHIAL WASHINGS  05/28/2022   Procedure: BRONCHIAL WASHINGS;  Surgeon: Gladis Leonor HERO, MD;  Location: [facility] ENDOSCOPY;  Service: Pulmonary;;   BRONCHOSCOPY  2011   DILATION AND CURETTAGE OF UTERUS     ESOPHAGOGASTRODUODENOSCOPY (EGD) WITH PROPOFOL  N/A 07/29/2021   Procedure: ESOPHAGOGASTRODUODENOSCOPY (EGD) WITH PROPOFOL ;  Surgeon: Eartha Angelia Sieving, MD;  [facility] ENDO [facility];  Service: Gastroenterology;  Laterality: N/A;  1235 ASA 2   HEMOSTASIS CONTROL  05/28/2022   Procedure: HEMOSTASIS CONTROL;  Surgeon: Gladis Leonor HERO, MD;  Location: [facility] ENDOSCOPY;  Service: Pulmonary;;   IR IMAGING GUIDED PORT INSERTION  08/12/2022   NECK SURGERY  1980's   THORACOTOMY Right 09/21/2013  Procedure: THORACOTOMY MAJOR;  Surgeon: Dorise MARLA Fellers, MD;  Location: [facility] OR;  Service: Thoracic;  Laterality: Right;   UTERINE FIBROID SURGERY  2012   WEDGE RESECTION Right 09/21/2013   Procedure: RIGHT UPPER LOBE WEDGE RESECTION;  Surgeon: Dorise MARLA Fellers, MD;  Location: [facility] OR;  Service: Thoracic;  Laterality: Right;     Social History:  The patient  reports that she quit smoking about 16 years ago. Her smoking use included cigarettes. She started smoking about 51 years ago. She has a 35 pack-year smoking history. She has been exposed to tobacco smoke. She has never used smokeless tobacco. She reports current alcohol   use. She reports that she does not use drugs.   Family History:  The patient's family history includes Brain cancer in her mother; Breast cancer in her maternal aunt; Diabetes in her son; Emphysema in her father; Heart disease in her paternal grandfather.    ROS:  Please see the history of present illness. All other systems are reviewed and  Negative to the above problem except as noted.    PHYSICAL EXAM: VS:  BP 112/62 (BP Location: Left Arm)   Pulse 74   Ht 5' 2 (1.575 m)   Wt 84 lb 12.8 oz (38.5 kg)   SpO2 98%   BMI 15.51 kg/m   GEN: Thin 78 yo in no acute distress  HEENT: normal  Neck: no JVD, carotid bruit, Cardiac: RRR; no murmurs  No LE edema  Respiratory:  Moving air  No rales  GI: soft, nontender, no masses  No hepatomegaly   EKG:  EKG is ordered today.  SR 74 bpm      Lipid Panel    Component Value Date/Time   TRIG 88 02/27/2022 1333   HDL 49 02/27/2022 1333      Wt Readings from Last 3 Encounters:  09/24/23 84 lb 12.8 oz (38.5 kg)  09/23/23 83 lb 12.8 oz (38 kg)  08/30/23 82 lb 14.3 oz (37.6 kg)      ASSESSMENT AND PLAN:  1  Atrial fibrllation  One episode of afib in 2023    Rx Multaq   No anticoagulation due to drop in Hgb.    She is off of multaq    Monitor in 2023 showed SR Note episode occurred in setting of possible pericarditis   Resolved  Recomm I would follow  for now No anticoagulation  2 Atherosclerosis of aorta  Rx risk factors  3  Pulmonary  Followed in oncology clinic   REcurrent small cell Ca  Continues on maintenance therapy    4  GI  Hx of gstroparesis   5  Anemia   Fe deficient   Last HGb 9.5  6   HL  LDL 117   Current medicines are reviewed at length with the patient today.  The patient does not have concerns regarding medicines.  Signed, Vina Gull, MD  09/24/2023 1:35 PM    [facility] [address] [place], [place], [place]  [address] Phone: [phone]; Fax: [phone]

## 2023-09-23 ENCOUNTER — Ambulatory Visit (INDEPENDENT_AMBULATORY_CARE_PROVIDER_SITE_OTHER): Admitting: Gastroenterology

## 2023-09-23 ENCOUNTER — Encounter (INDEPENDENT_AMBULATORY_CARE_PROVIDER_SITE_OTHER): Payer: Self-pay | Admitting: Gastroenterology

## 2023-09-23 VITALS — BP 100/56 | HR 75 | Temp 97.6°F | Ht 62.0 in | Wt 83.8 lb

## 2023-09-23 DIAGNOSIS — E440 Moderate protein-calorie malnutrition: Secondary | ICD-10-CM

## 2023-09-23 DIAGNOSIS — E46 Unspecified protein-calorie malnutrition: Secondary | ICD-10-CM | POA: Diagnosis not present

## 2023-09-23 DIAGNOSIS — K3184 Gastroparesis: Secondary | ICD-10-CM

## 2023-09-23 DIAGNOSIS — K5900 Constipation, unspecified: Secondary | ICD-10-CM

## 2023-09-23 MED ORDER — PRUCALOPRIDE SUCCINATE 1 MG PO TABS: 1.0000 mg | ORAL_TABLET | Freq: Every day | ORAL | 1 refills | Status: DC

## 2023-09-23 NOTE — Patient Instructions (Signed)
-  continue reglan  5mg  1-2 times for now -start prucalopride 1mg  daily -consider stopping reglan  if improvement on prucalopride  -continue with high protein/high calorie diet as tolerated  Follow up 2 months  It was a pleasure to see you today. I want to create trusting relationships with patients and provide genuine, compassionate, and quality care. I truly value your feedback! please be on the lookout for a survey regarding your visit with me today. I appreciate your input about our visit and your time in completing this!    Jamarious Febo L. Crickett Abbett, MSN, APRN, AGNP-C Adult-Gerontology Nurse Practitioner [facility] at [facility]

## 2023-09-23 NOTE — Progress Notes (Addendum)
 Referring Provider: [name], MD Primary Care Physician:  [name], MD Primary GI Physician: Dr. Eartha   Chief Complaint  Patient presents with   Follow-up    Pt arrives with husband for follow up. Vomiting/spitting up has greatly improved with medication. The last 2-3 weeks have been good. Pt is not gaining weight. Pt states she does get hungry but does not eat like most people. Pt son went to his PCP and spoke with him about pt-sons PCP recommend pt asking GI about Domperidone    HPI:   Claudia [name] is a 78 y.o. female with past medical history of recurrent small cell lung cancer s/p chemo with carboplatin  and etoposide , s/p radiation therapy and prophylactic cranial radiation, recurrent malignancy was treated with wedge resection, possible lymphocytic colitis, COPD, GERD, severe gastroparesis, possible achalasia  Patient presenting today for:  Follow up of gastroparesis, esophageal dysmotility, protein calorie malnutrition  Last seen march 2025, at that time had restarted reglan  at recommendation of Dr. Emeline, taking it only before dinner which had helped some. Discussion had with Dr. Emeline regarding feeding tube. Swallowing doing okay, appetite still not great. Met with nutritionist who reiterated high protein food diet. Taking stool softener occasionally for constipation, on mag oxide for sleep, having a BM every 2 days.   Recommended to try increasing reglan  to BID, aim for higher protein breakfast with greek yogurt or protein shake and high protein snack mid day, liberalize diet , follow recommendations of Dr. Emeline (follow up in April), consider use of prucalopride to help with both constipation and gastroparesis   Seen by Dr. Emeline in April with recommendations to continue reglan  5mg  daily, start omeprazole  40mg  daily due to reports of spitting up with eating, query paraneoplastic syndrome from her lung cancer  Present: States the spitting up has improved over  the past few weeks though she is not taking the omeprazole  as she did not think she needed it. She is using reglan  1 in the morning and 1 in the evening.  Previously having a lot of spitting up in the evenings and into the night. No real pattern to any certain foods causing her symptoms. Weight is still fluctuating up and down a pound or so each day, as she is logging her weight but cannot gain weight. Appetite is still not great, she is not eating much still. She is doing 1/2 protein shake in the morning, sometimes cottage cheese with fruit along with that, lunch is usually more of a snack like cheese and crackers or soup. She does not really eat bread as it does not agree with her. For dinner she does not typically eat much. No real nausea or vomiting over the past 2-3 weeks.  Feels constipation is well controlled over the past few weeks, previously taking a stool softener every few days but recently not taking anything to have a BM.     Barium esophagram 04/2022: concerning for achalasia  Last Colonoscopy:Last Colonoscopy:2013 - sigmoid diverticulosis, patchy loss of vascular marking, biopsies performed but no results available Last Endoscopy:5/2/23Fluid in the esophagus. Fluid aspiration performed. - A large amount of food (residue) in the stomach. - Normal examined duodenum GES:08/13/21: severe delay in gastric emptying, recommend eat small meals throughout the day  Past Medical History:  Diagnosis Date   Achalasia    Anemia    Arthritis    Bradycardia    mild,may be due to beta blocker therapy   COPD (chronic obstructive pulmonary  disease) (HCC)    Gastroparesis    GERD (gastroesophageal reflux disease)    otc   Hypertension 06/01/2019   pt denies   Local recurrence of lung cancer (HCC) dx'd 07/2013   rt thoracotomy chemo comp 12/2013   Lung cancer (HCC) 03/30/2008   Dr. Sherrod, finished chemo, sp radiation, left upper    Lymphocytic colitis    Pneumonia     Past Surgical  History:  Procedure Laterality Date   BRONCHIAL BIOPSY  05/28/2022   Procedure: BRONCHIAL BIOPSIES;  Surgeon: Gladis Leonor HERO, MD;  Location: [facility] ENDOSCOPY;  Service: Pulmonary;;   BRONCHIAL BRUSHINGS  05/28/2022   Procedure: BRONCHIAL BRUSHINGS;  Surgeon: Gladis Leonor HERO, MD;  Location: [facility] ENDOSCOPY;  Service: Pulmonary;;   BRONCHIAL NEEDLE ASPIRATION BIOPSY  05/28/2022   Procedure: BRONCHIAL NEEDLE ASPIRATION BIOPSIES;  Surgeon: Gladis Leonor HERO, MD;  Location: [facility] ENDOSCOPY;  Service: Pulmonary;;   BRONCHIAL WASHINGS  05/28/2022   Procedure: BRONCHIAL WASHINGS;  Surgeon: Gladis Leonor HERO, MD;  Location: [facility] ENDOSCOPY;  Service: Pulmonary;;   BRONCHOSCOPY  2011   DILATION AND CURETTAGE OF UTERUS     ESOPHAGOGASTRODUODENOSCOPY (EGD) WITH PROPOFOL  N/A 07/29/2021   Procedure: ESOPHAGOGASTRODUODENOSCOPY (EGD) WITH PROPOFOL ;  Surgeon: Eartha Angelia Sieving, MD;  Location: [facility] ENDO SUITE;  Service: Gastroenterology;  Laterality: N/A;  1235 ASA 2   HEMOSTASIS CONTROL  05/28/2022   Procedure: HEMOSTASIS CONTROL;  Surgeon: Gladis Leonor HERO, MD;  Location: [facility] ENDOSCOPY;  Service: Pulmonary;;   IR IMAGING GUIDED PORT INSERTION  08/12/2022   NECK SURGERY  1980's   THORACOTOMY Right 09/21/2013   Procedure: THORACOTOMY MAJOR;  Surgeon: Dorise MARLA Fellers, MD;  Location: [facility] OR;  Service: Thoracic;  Laterality: Right;   UTERINE FIBROID SURGERY  2012   WEDGE RESECTION Right 09/21/2013   Procedure: RIGHT UPPER LOBE WEDGE RESECTION;  Surgeon: Dorise MARLA Fellers, MD;  Location: [facility] OR;  Service: Thoracic;  Laterality: Right;    Current Outpatient Medications  Medication Sig Dispense Refill   Calcium  Carbonate Antacid (TUMS PO) Take 500-1,000 mg by mouth 3 (three) times daily as needed (indigestion/heartburn.).     magnesium  oxide (MAG-OX) 400 (240 Mg) MG tablet TAKE 1 TABLET BY MOUTH DAILY 60 tablet 2   metoCLOPramide  (REGLAN ) 5 MG tablet Take 1 tablet (5 mg total) by mouth 3 (three) times daily before meals.  180 tablet 3   tretinoin (RETIN-A) 0.1 % cream Apply 1 application  topically at bedtime.     No current facility-administered medications for this visit.    Allergies as of 09/23/2023 - Review Complete 09/23/2023  Allergen Reaction Noted   Azithromycin Shortness Of Breath and Rash 04/09/2011   Tussionex pennkinetic er [hydrocod poli-chlorphe poli er] Shortness Of Breath and Rash 04/09/2011   Omeprazole  Rash 11/13/2021    Social History   Socioeconomic History   Marital status: Married    Spouse name: Not on file   Number of children: Not on file   Years of education: Not on file   Highest education level: Not on file  Occupational History   Occupation: clerical    Employer: [employer]  Tobacco Use   Smoking status: Former    Current packs/day: 0.00    Average packs/day: 1 pack/day for 35.0 years (35.0 ttl pk-yrs)    Types: Cigarettes    Start date: 03/30/1972    Quit date: 03/31/2007    Years since quitting: 16.4    Passive exposure: Past   Smokeless tobacco: Never  Vaping Use  Vaping status: Never Used  Substance and Sexual Activity   Alcohol  use: Yes    Comment: occassional about 3 drinks per year   Drug use: No   Sexual activity: Never  Other Topics Concern   Not on file  Social History Narrative   Not on file   Social Drivers of Health   Financial Resource Strain: Not on file  Food Insecurity: No Food Insecurity (08/06/2022)   Hunger Vital Sign    Worried About Running Out of Food in the Last Year: Never true    Ran Out of Food in the Last Year: Never true  Transportation Needs: No Transportation Needs (08/06/2022)   PRAPARE - Administrator, Civil Service (Medical): No    Lack of Transportation (Non-Medical): No  Physical Activity: Not on file  Stress: Not on file  Social Connections: Not on file    Review of systems General: negative for malaise, night sweats, fever, chills, weight loss Neck: Negative for lumps, goiter, pain and  significant neck swelling Resp: Negative for cough, wheezing, dyspnea at rest CV: Negative for chest pain, leg swelling, palpitations, orthopnea GI: denies melena, hematochezia, nausea, vomiting, diarrhea, constipation, dysphagia, odyonophagia, early satiety or unintentional weight loss.  MSK: Negative for joint pain or swelling, back pain, and muscle pain. Derm: Negative for itching or rash Psych: Denies depression, anxiety, memory loss, confusion. No homicidal or suicidal ideation.  Heme: Negative for prolonged bleeding, bruising easily, and swollen nodes. Endocrine: Negative for cold or heat intolerance, polyuria, polydipsia and goiter. Neuro: negative for tremor, gait imbalance, syncope and seizures. The remainder of the review of systems is noncontributory.  Physical Exam: There were no vitals taken for this visit. General:   Alert and oriented. No distress noted. Pleasant and cooperative. Cachectic  Head:  Normocephalic and atraumatic. Eyes:  Conjuctiva clear without scleral icterus. Mouth:  Oral mucosa pink and moist. Good dentition. No lesions. Heart: Normal rate and rhythm, s1 and s2 heart sounds present.  Lungs: Clear lung sounds in all lobes. Respirations equal and unlabored. Abdomen:  +BS, soft, non-tender and non-distended. No rebound or guarding. No HSM or masses noted. Neurologic:  Alert and  oriented x4 Psych:  Alert and cooperative. Normal mood and affect.  Invalid input(s): 6 MONTHS   ASSESSMENT: Claudia [name] is a 78 y.o. female presenting today for follow up of gastroparesis, esophageal dysmotility and protein calorie malnutrition  denies issues with swallowing currently. She is doing about the same as at her last visit. Able to eat maybe one good meal per day, usually appetite is not great in the evening though. She saw Dr. Emeline at [facility] and was recommended to continue Reglan  grams 3 times daily though she notes she is only taking Reglan  BID which seems to  have helped her symptoms. Was advised to start omeprazole  but she did not as she did not think she needed this.  Weight is 83 pounds today, she has been between 82-86lbs since January though no real gains. She has history of constipation, we discussed prucalopride at last visit as this may help with constipation as well as her gastroparesis as prucalopride can accelerate gastric emptying and colon transit time. For now, we will continue reglan , though if prucalopride provides some improvement, we can consider stopping this medication. If she has not improvement with prucalopride, may consider use of domperidone at follow up visit. Patient and husband are concerned about weight, as am I, however, she seems to be maintaining for now,  if she were to trend down, would need to have further discussion regarding feeding tube which she has declined in the past. Needs to continue high protein/high calorie diet as tolerated.    PLAN:  -continue reglan  5mg  1-2 times for now -start prucalopride 1mg  daily -consider stopping reglan  if improvement on prucalopride  -continue high calorie/high protein diet as tolerated -consider domperidone at follow up if no improvement with prucalopride  All questions were answered, patient verbalized understanding and is in agreement with plan as outlined above.    Follow Up: 2 months   Divit Stipp L. Mariette, MSN, APRN, AGNP-C Adult-Gerontology Nurse Practitioner [facility]  I have reviewed the note and agree with the APP's assessment as described in this progress note  Toribio Fortune, MD Gastroenterology and Hepatology [facility] Gastroenterology

## 2023-09-24 ENCOUNTER — Ambulatory Visit: Attending: Internal Medicine | Admitting: Internal Medicine

## 2023-09-24 ENCOUNTER — Telehealth (INDEPENDENT_AMBULATORY_CARE_PROVIDER_SITE_OTHER): Payer: Self-pay | Admitting: Gastroenterology

## 2023-09-24 ENCOUNTER — Other Ambulatory Visit: Payer: Self-pay

## 2023-09-24 ENCOUNTER — Encounter: Payer: Self-pay | Admitting: Internal Medicine

## 2023-09-24 VITALS — BP 112/62 | HR 74 | Ht 62.0 in | Wt 84.8 lb

## 2023-09-24 DIAGNOSIS — I480 Paroxysmal atrial fibrillation: Secondary | ICD-10-CM | POA: Diagnosis not present

## 2023-09-24 NOTE — Patient Instructions (Signed)
 Medication Instructions:  Your physician recommends that you continue on your current medications as directed. Please refer to the Current Medication list given to you today.   Labwork: None today  Testing/Procedures: None today  Follow-Up: 1 year  Any Other Special Instructions Will Be Listed Below (If Applicable).  If you need a refill on your cardiac medications before your next appointment, please call your pharmacy.

## 2023-09-24 NOTE — Telephone Encounter (Signed)
 PA completed for Prucalopride 1 mg tablet. PA has been denied due to criteria not being met. Unmet criteria are you have tried and cannot use Linzess AND lubiprostone.  An appeal can be filed but has to be filed by June 30.2025  Please advise. Thank you.

## 2023-09-27 ENCOUNTER — Inpatient Hospital Stay

## 2023-09-27 ENCOUNTER — Encounter: Payer: Self-pay | Admitting: Hematology

## 2023-09-27 ENCOUNTER — Inpatient Hospital Stay: Admitting: Hematology

## 2023-09-27 VITALS — BP 100/43 | HR 81 | Temp 98.2°F | Resp 18

## 2023-09-27 VITALS — Wt 82.0 lb

## 2023-09-27 DIAGNOSIS — D508 Other iron deficiency anemias: Secondary | ICD-10-CM

## 2023-09-27 DIAGNOSIS — C3411 Malignant neoplasm of upper lobe, right bronchus or lung: Secondary | ICD-10-CM | POA: Diagnosis not present

## 2023-09-27 DIAGNOSIS — Z79620 Long term (current) use of immunosuppressive biologic: Secondary | ICD-10-CM | POA: Diagnosis not present

## 2023-09-27 DIAGNOSIS — Z5112 Encounter for antineoplastic immunotherapy: Secondary | ICD-10-CM | POA: Diagnosis not present

## 2023-09-27 LAB — COMPREHENSIVE METABOLIC PANEL WITH GFR
ALT: 7 U/L (ref 0–44)
AST: 14 U/L — ABNORMAL LOW (ref 15–41)
Albumin: 3.2 g/dL — ABNORMAL LOW (ref 3.5–5.0)
Alkaline Phosphatase: 62 U/L (ref 38–126)
Anion gap: 8 (ref 5–15)
BUN: 14 mg/dL (ref 8–23)
CO2: 27 mmol/L (ref 22–32)
Calcium: 9.5 mg/dL (ref 8.9–10.3)
Chloride: 100 mmol/L (ref 98–111)
Creatinine, Ser: 0.73 mg/dL (ref 0.44–1.00)
GFR, Estimated: >60 mL/min (ref >60)
Glucose, Bld: 109 mg/dL — ABNORMAL HIGH (ref 70–99)
Potassium: 4 mmol/L (ref 3.5–5.1)
Sodium: 135 mmol/L (ref 135–145)
Total Bilirubin: 1.3 mg/dL — ABNORMAL HIGH (ref 0.0–1.2)
Total Protein: 6.3 g/dL — ABNORMAL LOW (ref 6.5–8.1)

## 2023-09-27 LAB — CBC WITH DIFFERENTIAL/PLATELET
Abs Immature Granulocytes: 0.1 10*3/uL — ABNORMAL HIGH (ref 0.00–0.07)
Basophils Absolute: 0.1 10*3/uL (ref 0.0–0.1)
Basophils Relative: 1 %
Eosinophils Absolute: 0 10*3/uL (ref 0.0–0.5)
Eosinophils Relative: 1 %
HCT: 29.8 % — ABNORMAL LOW (ref 36.0–46.0)
Hemoglobin: 9.8 g/dL — ABNORMAL LOW (ref 12.0–15.0)
Immature Granulocytes: 1 %
Lymphocytes Relative: 4 %
Lymphs Abs: 0.3 10*3/uL — ABNORMAL LOW (ref 0.7–4.0)
MCH: 33.2 pg (ref 26.0–34.0)
MCHC: 32.9 g/dL (ref 30.0–36.0)
MCV: 101 fL — ABNORMAL HIGH (ref 80.0–100.0)
Monocytes Absolute: 1.6 10*3/uL — ABNORMAL HIGH (ref 0.1–1.0)
Monocytes Relative: 19 %
Neutro Abs: 6.4 10*3/uL (ref 1.7–7.7)
Neutrophils Relative %: 74 %
Platelets: 118 10*3/uL — ABNORMAL LOW (ref 150–400)
RBC: 2.95 MIL/uL — ABNORMAL LOW (ref 3.87–5.11)
RDW: 15.2 % (ref 11.5–15.5)
WBC: 8.5 10*3/uL (ref 4.0–10.5)
nRBC: 0 % (ref 0.0–0.2)

## 2023-09-27 LAB — VITAMIN B12: Vitamin B-12: 449 pg/mL (ref 180–914)

## 2023-09-27 LAB — IRON AND TIBC
Iron: 18 ug/dL — ABNORMAL LOW (ref 28–170)
Saturation Ratios: 11 % (ref 10.4–31.8)
TIBC: 167 ug/dL — ABNORMAL LOW (ref 250–450)
UIBC: 149 ug/dL

## 2023-09-27 LAB — FERRITIN: Ferritin: 685 ng/mL — ABNORMAL HIGH (ref 11–307)

## 2023-09-27 LAB — MAGNESIUM: Magnesium: 1.8 mg/dL (ref 1.7–2.4)

## 2023-09-27 LAB — TSH: TSH: 0.255 u[IU]/mL — ABNORMAL LOW (ref 0.350–4.500)

## 2023-09-27 LAB — FOLATE: Folate: 13.5 ng/mL (ref >5.9)

## 2023-09-27 MED ORDER — SODIUM CHLORIDE 0.9% FLUSH
10.0000 mL | Freq: Once | INTRAVENOUS | Status: AC
  Administered 2023-09-27: 10 mL via INTRAVENOUS

## 2023-09-27 MED ORDER — SODIUM CHLORIDE 0.9 % IV SOLN: Freq: Once | INTRAVENOUS | Status: AC

## 2023-09-27 MED ORDER — HEPARIN SOD (PORK) LOCK FLUSH 100 UNIT/ML IV SOLN
500.0000 [IU] | Freq: Once | INTRAVENOUS | Status: AC | PRN
  Administered 2023-09-27: 500 [IU]

## 2023-09-27 MED ORDER — SODIUM CHLORIDE 0.9 % IV SOLN
1500.0000 mg | Freq: Once | INTRAVENOUS | Status: AC
  Administered 2023-09-27: 1500 mg via INTRAVENOUS
  Filled 2023-09-27: qty 30

## 2023-09-27 NOTE — Patient Instructions (Signed)
 [facility] [place] - A DEPT OF [facility]  Discharge Instructions: Thank you for choosing [facility] to provide your oncology and hematology care.  If you have a lab appointment with the Cancer Center - please note that after April 8th, 2024, all labs will be drawn in the cancer center.  You do not have to check in or register with the main entrance as you have in the past but will complete your check-in in the cancer center.  Wear comfortable clothing and clothing appropriate for easy access to any Portacath or PICC line.   We strive to give you quality time with your provider. You may need to reschedule your appointment if you arrive late (15 or more minutes).  Arriving late affects you and other patients whose appointments are after yours.  Also, if you miss three or more appointments without notifying the office, you may be dismissed from the clinic at the provider's discretion.      For prescription refill requests, have your pharmacy contact our office and allow 72 hours for refills to be completed.    Today you received the following chemotherapy and/or immunotherapy agents Imfiniz   To help prevent nausea and vomiting after your treatment, we encourage you to take your nausea medication as directed.  BELOW ARE SYMPTOMS THAT SHOULD BE REPORTED IMMEDIATELY: *FEVER GREATER THAN 100.4 F (38 C) OR HIGHER *CHILLS OR SWEATING *NAUSEA AND VOMITING THAT IS NOT CONTROLLED WITH YOUR NAUSEA MEDICATION *UNUSUAL SHORTNESS OF BREATH *UNUSUAL BRUISING OR BLEEDING *URINARY PROBLEMS (pain or burning when urinating, or frequent urination) *BOWEL PROBLEMS (unusual diarrhea, constipation, pain near the anus) TENDERNESS IN MOUTH AND THROAT WITH OR WITHOUT PRESENCE OF ULCERS (sore throat, sores in mouth, or a toothache) UNUSUAL RASH, SWELLING OR PAIN  UNUSUAL VAGINAL DISCHARGE OR ITCHING   Items with * indicate a potential emergency and should be followed up as  soon as possible or go to the Emergency Department if any problems should occur.  Please show the CHEMOTHERAPY ALERT CARD or IMMUNOTHERAPY ALERT CARD at check-in to the Emergency Department and triage nurse.  Should you have questions after your visit or need to cancel or reschedule your appointment, please contact [facility] [place] - A DEPT OF [facility] [phone]  and follow the prompts.  Office hours are 8:00 a.m. to 4:30 p.m. Monday - Friday. Please note that voicemails left after 4:00 p.m. may not be returned until the following business day.  We are closed weekends and major holidays. You have access to a nurse at all times for urgent questions. Please call the main number to the clinic [phone] and follow the prompts.  For any non-urgent questions, you may also contact your provider using MyChart. We now offer e-Visits for anyone 18 and older to request care online for non-urgent symptoms. For details visit [email or web address].   Also download the MyChart app! Go to the app store, search "MyChart", open the app, select [place], and log in with your MyChart username and password.

## 2023-09-27 NOTE — Progress Notes (Signed)
 Patient tolerated chemotherapy with no complaints voiced.  Side effects with management reviewed with understanding verbalized.  Port site clean and dry with no bruising or swelling noted at site.  Good blood return noted before and after administration of chemotherapy.  Band aid applied.  Patient left in satisfactory condition with VSS and no s/s of distress noted. All follow ups as scheduled.   Venkat Ankney [employer]

## 2023-09-27 NOTE — Progress Notes (Signed)
 Patient has been examined by Dr. Ellin Saba. Vital signs and labs have been reviewed by MD - ANC, Creatinine, LFTs, hemoglobin, and platelets are within treatment parameters per M.D. - pt may proceed with treatment.  Primary RN and pharmacy notified.

## 2023-09-27 NOTE — Progress Notes (Signed)
 [facility] [address], [place] [address]    Clinic Day:  09/27/2023  Referring physician: [name], MD  Patient Care Team: [name], MD as PCP - General (Internal Medicine) [name], MD as PCP - Cardiology (Cardiology) [name], MD as Consulting Physician (Gastroenterology) Rogers Hai, MD as Medical Oncologist (Medical Oncology) [name], RN as Oncology Nurse Navigator (Medical Oncology)   ASSESSMENT & PLAN:   Assessment: 1.  Recurrent extensive stage small cell lung cancer: - Patient seen at the request of Dr. Gatha to get care closer to home. - 4 cycles of carboplatin  and etoposide  with concurrent radiation therapy for limited stage small cell lung cancer on 11/14/2009 - Status post PCI completed on 01/28/2010 - Wedge resection of the RUL by Dr. Sherrine on 09/20/2013, pathology consistent with small cell lung cancer. - 4 cycles of carboplatin  and etoposide  from 10/30/2013 through 01/08/2014 - PET scan on 04/23/2012: Marked hypermetabolism in the medial and anterior aspect of the left upper lobe.  Several hypermetabolic lung nodules in the left lung.  9 mm lesion in the left lower lobe.  No metastatic disease in the AP. - Bronchoscopy and FNA of the LUL nodule and LUL brushing and lavage with no malignant cells. - CT chest (08/03/2022): Scattered new nodular densities in the lungs bilaterally, largest in the left lower lobe.  New subcentimeter hypodense lesion in the left hepatic lobe 7 mm.  Periportal adenopathy 2.3 x 2.6 cm. - MRI of brain results from 08/14/2022: No metastatic disease.  Progression of small vessel ischemic disease. - PET scan from 08/13/2022 showed radiation changes in left upper lobe with focal hypermetabolism suggestive of residual recurrent tumor although this is improved from prior PET.  Dominant 8 mm subpleural nodule in the left lower lobe unchanged from recent CT and new from prior PET.  Metastatic  disease not excluded although infection/inflammation is also possible.  Prior hypermetabolic nodules on PET have resolved.  No evidence of metastatic disease in the abdomen or pelvis. - Cycle 1 of carboplatin /etoposide /durvalumab  on 08/17/2022, cycle 4 on 11/02/2022.  Maintenance durvalumab  started on 11/23/2022.   2.  Social/family history: - She moved back to [place] recently and lives with her husband.  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  She also worked at [employer] for 15 years.  Quit smoking in 2010 and smoked 1 pack/day for 30 years. - Mother had brain tumor.  Maternal aunt had breast cancer.    Plan: 1.  Recurrent extensive stage small cell lung cancer: - She is tolerating monthly durvalumab  very well.  Denies any immunotherapy related side effects including diarrhea, worsening shortness of breath. - Labs from 09/27/2023: Normal LFTs.  CBC shows stable macrocytic anemia and mild thrombocytopenia.  We have done anemia workup which showed normal folic acid  and B12.  Ferritin was elevated indicating anemia from chronic inflammation.  TSH is 0.255.  Will repeat in 4 weeks. - She will continue treatment today.  RTC in 4 weeks.   2.  Gastroparesis: - Continue to use Reglan  5 mg 3 times daily prior to meals.  Her weight is stable since last visit.   3.  Hypomagnesemia: - Continue magnesium  daily.  Magnesium  is normal.   4.  Skin rash: - She had developed erythematous maculopapular blanching rash predominantly on the trunk and upper thighs.  She reports improvement in the rash since last visit.    Orders  Placed This Encounter  Procedures   TSH    Standing Status:   Future    Expected Date:   10/25/2023    Expiration Date:   10/24/2024   Magnesium     Standing Status:   Future    Expected Date:   10/25/2023    Expiration Date:   10/24/2024   CBC with Differential    Standing Status:   Future    Expected Date:   10/25/2023    Expiration Date:   10/24/2024    Comprehensive metabolic panel    Standing Status:   Future    Expected Date:   10/25/2023    Expiration Date:   10/24/2024   TSH    Standing Status:   Future    Expected Date:   11/22/2023    Expiration Date:   11/21/2024   Magnesium     Standing Status:   Future    Expected Date:   11/22/2023    Expiration Date:   11/21/2024   CBC with Differential    Standing Status:   Future    Expected Date:   11/22/2023    Expiration Date:   11/21/2024   Comprehensive metabolic panel    Standing Status:   Future    Expected Date:   11/22/2023    Expiration Date:   11/21/2024     LILLETTE Hummingbird R Teague,acting as a scribe for Alean Stands, MD.,have documented all relevant documentation on the behalf of Alean Stands, MD,as directed by  Alean Stands, MD while in the presence of Alean Stands, MD.  I, Alean Stands MD, have reviewed the above documentation for accuracy and completeness, and I agree with the above.     Alean Stands, MD   6/30/20255:00 PM  CHIEF COMPLAINT:   Diagnosis: recurrent small cell lung cancer    Cancer Staging  Small cell lung cancer (HCC) Staging form: Lung, AJCC 7th Edition - Clinical: Limited Stage Small cell Lung Cancer - Signed by Sherrod Sherrod, MD on 05/10/2013    Prior Therapy: 1. Concurrent chemoradiation with carboplatin  and etoposide , completed 11/14/2009.  2. Prophylactic cranial irradiation, completed 01/28/2010. 3. RUL wedge resection on 09/21/2013 (Dr. Lucas) 4. 4 cycles of carboplatin  and etoposide , 10/30/2013 - 01/10/2014  Current Therapy:  Carboplatin , etoposide  and durvalumab     HISTORY OF PRESENT ILLNESS:   Oncology History  Small cell lung cancer (HCC)  04/10/2011 Initial Diagnosis   Small cell lung cancer (HCC)   08/17/2022 -  Chemotherapy   Patient is on Treatment Plan : LUNG SMALL CELL EXTENSIVE STAGE Durvalumab  + Carboplatin  D1 + Etoposide  D1-3 q21d x 4 Cycles / Durvalumab  q28d        INTERVAL  HISTORY:   Claudia [name] is a 78 y.o. female seen for follow-up of recurrent small cell lung cancer. She was last seen by me on 08/30/23.  Today, she states that she is doing well overall. Her appetite level is at 25%. Her energy level is at 25%.   PAST MEDICAL HISTORY:   Past Medical History: Past Medical History:  Diagnosis Date   Achalasia    Anemia    Arthritis    Bradycardia    mild,may be due to beta blocker therapy   COPD (chronic obstructive pulmonary disease) (HCC)    Gastroparesis    GERD (gastroesophageal reflux disease)    otc   Hypertension 06/01/2019   pt denies   Local recurrence of lung cancer (HCC) dx'd 07/2013   rt thoracotomy chemo comp 12/2013  Lung cancer (HCC) 03/30/2008   Dr. Sherrod, finished chemo, sp radiation, left upper    Lymphocytic colitis    Pneumonia     Surgical History: Past Surgical History:  Procedure Laterality Date   BRONCHIAL BIOPSY  05/28/2022   Procedure: BRONCHIAL BIOPSIES;  Surgeon: Gladis Leonor HERO, MD;  Location: [facility] ENDOSCOPY;  Service: Pulmonary;;   BRONCHIAL BRUSHINGS  05/28/2022   Procedure: BRONCHIAL BRUSHINGS;  Surgeon: Gladis Leonor HERO, MD;  Location: [facility] ENDOSCOPY;  Service: Pulmonary;;   BRONCHIAL NEEDLE ASPIRATION BIOPSY  05/28/2022   Procedure: BRONCHIAL NEEDLE ASPIRATION BIOPSIES;  Surgeon: Gladis Leonor HERO, MD;  Location[facility] ENDOSCOPY;  Service: Pulmonary;;   BRONCHIAL WASHINGS  05/28/2022   Procedure: BRONCHIAL WASHINGS;  Surgeon: Gladis Leonor HERO, MD;  Location: [facility] ENDOSCOPY;  Service: Pulmonary;;   BRONCHOSCOPY  2011   DILATION AND CURETTAGE OF UTERUS     ESOPHAGOGASTRODUODENOSCOPY (EGD) WITH PROPOFOL  N/A 07/29/2021   Procedure: ESOPHAGOGASTRODUODENOSCOPY (EGD) WITH PROPOFOL ;  Surgeon: Eartha Angelia Sieving, MD;  Location: [facility] ENDO SUITE;  Service: Gastroenterology;  Laterality: N/A;  1235 ASA 2   HEMOSTASIS CONTROL  05/28/2022   Procedure: HEMOSTASIS CONTROL;  Surgeon: Gladis Leonor HERO, MD;  Location: [facility] ENDOSCOPY;   Service: Pulmonary;;   IR IMAGING GUIDED PORT INSERTION  08/12/2022   NECK SURGERY  1980's   THORACOTOMY Right 09/21/2013   Procedure: THORACOTOMY MAJOR;  Surgeon: Dorise MARLA Fellers, MD;  Location: [facility] OR;  Service: Thoracic;  Laterality: Right;   UTERINE FIBROID SURGERY  2012   WEDGE RESECTION Right 09/21/2013   Procedure: RIGHT UPPER LOBE WEDGE RESECTION;  Surgeon: Dorise MARLA Fellers, MD;  Location: [facility] OR;  Service: Thoracic;  Laterality: Right;    Social History: Social History   Socioeconomic History   Marital status: Married    Spouse name: Not on file   Number of children: Not on file   Years of education: Not on file   Highest education level: Not on file  Occupational History   Occupation: clerical    Employer: [employer]  Tobacco Use   Smoking status: Former    Current packs/day: 0.00    Average packs/day: 1 pack/day for 35.0 years (35.0 ttl pk-yrs)    Types: Cigarettes    Start date: 03/30/1972    Quit date: 03/31/2007    Years since quitting: 16.5    Passive exposure: Past   Smokeless tobacco: Never  Vaping Use   Vaping status: Never Used  Substance and Sexual Activity   Alcohol  use: Yes    Comment: occassional about 3 drinks per year   Drug use: No   Sexual activity: Never  Other Topics Concern   Not on file  Social History Narrative   Not on file   Social Drivers of Health   Financial Resource Strain: Not on file  Food Insecurity: No Food Insecurity (08/06/2022)   Hunger Vital Sign    Worried About Running Out of Food in the Last Year: Never true    Ran Out of Food in the Last Year: Never true  Transportation Needs: No Transportation Needs (08/06/2022)   PRAPARE - Administrator, Civil Service (Medical): No    Lack of Transportation (Non-Medical): No  Physical Activity: Not on file  Stress: Not on file  Social Connections: Not on file  Intimate Partner Violence: Not At Risk (08/06/2022)   Humiliation, Afraid, Rape, and Kick questionnaire    Fear of  Current or Ex-Partner: No    Emotionally Abused: No  Physically Abused: No    Sexually Abused: No    Family History: Family History  Problem Relation Age of Onset   Brain cancer Mother        brain cancer   Emphysema Father    Diabetes Son    Heart disease Paternal Grandfather    Breast cancer Maternal Aunt     Current Medications:  Current Outpatient Medications:    Calcium  Carbonate Antacid (TUMS PO), Take 500-1,000 mg by mouth 3 (three) times daily as needed (indigestion/heartburn.)., Disp: , Rfl:    magnesium  oxide (MAG-OX) 400 (240 Mg) MG tablet, TAKE 1 TABLET BY MOUTH DAILY, Disp: 60 tablet, Rfl: 2   metoCLOPramide  (REGLAN ) 5 MG tablet, Take 1 tablet (5 mg total) by mouth 3 (three) times daily before meals., Disp: 180 tablet, Rfl: 3   Prucalopride Succinate  1 MG TABS, Take 1 mg by mouth daily., Disp: 30 tablet, Rfl: 1   tretinoin (RETIN-A) 0.1 % cream, Apply 1 application  topically at bedtime., Disp: , Rfl:    Allergies: Allergies  Allergen Reactions   Azithromycin Shortness Of Breath and Rash    Took Tussionex at same time Jan 2013.   Tussionex Pennkinetic Er [Hydrocod Poli-Chlorphe Poli Er] Shortness Of Breath and Rash    Took Azithromycin at same time Jan 2013   Omeprazole  Rash    REVIEW OF SYSTEMS:   Review of Systems  Constitutional:  Negative for chills, fatigue and fever.  HENT:   Negative for lump/mass, mouth sores, nosebleeds, sore throat and trouble swallowing.   Eyes:  Negative for eye problems.  Respiratory:  Negative for cough and shortness of breath.   Cardiovascular:  Negative for chest pain, leg swelling and palpitations.  Gastrointestinal:  Negative for abdominal pain, constipation, diarrhea, nausea and vomiting.  Genitourinary:  Negative for bladder incontinence, difficulty urinating, dysuria, frequency, hematuria and nocturia.   Musculoskeletal:  Negative for arthralgias, back pain, flank pain, myalgias and neck pain.  Skin:  Negative for  itching and rash.  Neurological:  Negative for dizziness, headaches and numbness.  Hematological:  Does not bruise/bleed easily.  Psychiatric/Behavioral:  Negative for depression, sleep disturbance and suicidal ideas. The patient is not nervous/anxious.   All other systems reviewed and are negative.    VITALS:   Weight 82 lb 0.2 oz (37.2 kg).  Wt Readings from Last 3 Encounters:  09/27/23 82 lb 0.2 oz (37.2 kg)  09/24/23 84 lb 12.8 oz (38.5 kg)  09/23/23 83 lb 12.8 oz (38 kg)    Body mass index is 15 kg/m.  Performance status (ECOG): 1 - Symptomatic but completely ambulatory  PHYSICAL EXAM:   Physical Exam Vitals and nursing note reviewed. Exam conducted with a chaperone present.  Constitutional:      Appearance: Normal appearance.   Cardiovascular:     Rate and Rhythm: Normal rate and regular rhythm.     Pulses: Normal pulses.     Heart sounds: Normal heart sounds.  Pulmonary:     Effort: Pulmonary effort is normal.     Breath sounds: Normal breath sounds.  Abdominal:     Palpations: Abdomen is soft. There is no hepatomegaly, splenomegaly or mass.     Tenderness: There is no abdominal tenderness.   Musculoskeletal:     Right lower leg: No edema.     Left lower leg: No edema.  Lymphadenopathy:     Cervical: No cervical adenopathy.     Right cervical: No superficial, deep or posterior cervical adenopathy.  Left cervical: No superficial, deep or posterior cervical adenopathy.     Upper Body:     Right upper body: No supraclavicular or axillary adenopathy.     Left upper body: No supraclavicular or axillary adenopathy.   Neurological:     General: No focal deficit present.     Mental Status: She is alert and oriented to person, place, and time.   Psychiatric:        Mood and Affect: Mood normal.        Behavior: Behavior normal.     LABS:      Latest Ref Rng & Units 09/27/2023   11:49 AM 08/30/2023    1:12 PM 08/02/2023   11:39 AM  CBC  WBC 4.0 - 10.5 K/uL  8.5  8.2  3.6   Hemoglobin 12.0 - 15.0 g/dL 9.8  9.5  89.5   Hematocrit 36.0 - 46.0 % 29.8  28.8  32.5   Platelets 150 - 400 K/uL 118  132  89       Latest Ref Rng & Units 09/27/2023   11:49 AM 08/30/2023    1:08 PM 08/02/2023   11:39 AM  CMP  Glucose 70 - 99 mg/dL 890  895  95   BUN 8 - 23 mg/dL 14  19  15    Creatinine 0.44 - 1.00 mg/dL 9.26  9.22  9.35   Sodium 135 - 145 mmol/L 135  136  137   Potassium 3.5 - 5.1 mmol/L 4.0  4.3  4.0   Chloride 98 - 111 mmol/L 100  101  101   CO2 22 - 32 mmol/L 27  26  27    Calcium  8.9 - 10.3 mg/dL 9.5  9.3  9.7   Total Protein 6.5 - 8.1 g/dL 6.3  6.5  6.3   Total Bilirubin 0.0 - 1.2 mg/dL 1.3  0.3  0.6   Alkaline Phos 38 - 126 U/L 62  64  63   AST 15 - 41 U/L 14  13  14    ALT 0 - 44 U/L 7  7  7       No results found for: CEA1, CEA / No results found for: CEA1, CEA No results found for: PSA1 No results found for: CAN199 No results found for: CAN125  No results found for: TOTALPROTELP, ALBUMINELP, A1GS, A2GS, BETS, BETA2SER, GAMS, MSPIKE, SPEI Lab Results  Component Value Date   TIBC 167 (L) 09/27/2023   TIBC 238 (L) 08/17/2022   TIBC 149 (L) 10/08/2021   FERRITIN 685 (H) 09/27/2023   FERRITIN 296 08/17/2022   FERRITIN 675 (H) 10/08/2021   IRONPCTSAT 11 09/27/2023   IRONPCTSAT 21 08/17/2022   IRONPCTSAT 9 (L) 10/08/2021   No results found for: LDH   STUDIES:   No results found.

## 2023-09-27 NOTE — Patient Instructions (Signed)

## 2023-09-27 NOTE — Telephone Encounter (Signed)
 Appeal letter faxed to [phone]

## 2023-09-28 LAB — COPPER, SERUM: Copper: 102 ug/dL (ref 80–158)

## 2023-09-29 NOTE — Telephone Encounter (Signed)
 Fax received from [facility] stating Prucalopirde 1 mg tablet still denied. Denied because you must have had previous treatment, intolerance, or contraindication to Linzess and Lubiprostone.   We can ask for another appeal. Determination on provider desk.

## 2023-09-30 NOTE — Telephone Encounter (Signed)
 Pt contacted. Pt states at this time she is feeling OK. Pt stated we can go with the medication for the time being. Pt states she sees us  coming up in August and at that time she could make a better decision. I did go over pricing with her with the [place] pharmacy. Pt would like to hold off on med for now.

## 2023-10-04 DIAGNOSIS — Z299 Encounter for prophylactic measures, unspecified: Secondary | ICD-10-CM | POA: Diagnosis not present

## 2023-10-04 DIAGNOSIS — U071 COVID-19: Secondary | ICD-10-CM | POA: Diagnosis not present

## 2023-10-04 DIAGNOSIS — C3490 Malignant neoplasm of unspecified part of unspecified bronchus or lung: Secondary | ICD-10-CM | POA: Diagnosis not present

## 2023-10-04 DIAGNOSIS — I4891 Unspecified atrial fibrillation: Secondary | ICD-10-CM | POA: Diagnosis not present

## 2023-10-04 DIAGNOSIS — R0981 Nasal congestion: Secondary | ICD-10-CM | POA: Diagnosis not present

## 2023-10-25 ENCOUNTER — Encounter: Payer: Self-pay | Admitting: Hematology

## 2023-10-25 ENCOUNTER — Inpatient Hospital Stay

## 2023-10-25 ENCOUNTER — Inpatient Hospital Stay: Attending: Nurse Practitioner | Admitting: Hematology

## 2023-10-25 ENCOUNTER — Inpatient Hospital Stay: Attending: Nurse Practitioner

## 2023-10-25 VITALS — BP 117/52 | HR 75 | Resp 18

## 2023-10-25 DIAGNOSIS — C3411 Malignant neoplasm of upper lobe, right bronchus or lung: Secondary | ICD-10-CM | POA: Insufficient documentation

## 2023-10-25 DIAGNOSIS — Z5112 Encounter for antineoplastic immunotherapy: Secondary | ICD-10-CM | POA: Insufficient documentation

## 2023-10-25 DIAGNOSIS — Z79620 Long term (current) use of immunosuppressive biologic: Secondary | ICD-10-CM | POA: Diagnosis not present

## 2023-10-25 LAB — COMPREHENSIVE METABOLIC PANEL WITH GFR
ALT: 7 U/L (ref 0–44)
AST: 14 U/L — ABNORMAL LOW (ref 15–41)
Albumin: 3.3 g/dL — ABNORMAL LOW (ref 3.5–5.0)
Alkaline Phosphatase: 66 U/L (ref 38–126)
Anion gap: 5 (ref 5–15)
BUN: 14 mg/dL (ref 8–23)
CO2: 26 mmol/L (ref 22–32)
Calcium: 9.5 mg/dL (ref 8.9–10.3)
Chloride: 107 mmol/L (ref 98–111)
Creatinine, Ser: 0.73 mg/dL (ref 0.44–1.00)
GFR, Estimated: >60 mL/min (ref >60)
Glucose, Bld: 82 mg/dL (ref 70–99)
Potassium: 4.1 mmol/L (ref 3.5–5.1)
Sodium: 138 mmol/L (ref 135–145)
Total Bilirubin: 0.7 mg/dL (ref 0.0–1.2)
Total Protein: 6.5 g/dL (ref 6.5–8.1)

## 2023-10-25 LAB — CBC WITH DIFFERENTIAL/PLATELET
Abs Immature Granulocytes: 0.07 K/uL (ref 0.00–0.07)
Basophils Absolute: 0.1 K/uL (ref 0.0–0.1)
Basophils Relative: 1 %
Eosinophils Absolute: 0 K/uL (ref 0.0–0.5)
Eosinophils Relative: 1 %
HCT: 27.8 % — ABNORMAL LOW (ref 36.0–46.0)
Hemoglobin: 9.1 g/dL — ABNORMAL LOW (ref 12.0–15.0)
Immature Granulocytes: 2 %
Lymphocytes Relative: 12 %
Lymphs Abs: 0.5 K/uL — ABNORMAL LOW (ref 0.7–4.0)
MCH: 33 pg (ref 26.0–34.0)
MCHC: 32.7 g/dL (ref 30.0–36.0)
MCV: 100.7 fL — ABNORMAL HIGH (ref 80.0–100.0)
Monocytes Absolute: 0.9 K/uL (ref 0.1–1.0)
Monocytes Relative: 19 %
Neutro Abs: 3.1 K/uL (ref 1.7–7.7)
Neutrophils Relative %: 65 %
Platelets: 129 K/uL — ABNORMAL LOW (ref 150–400)
RBC: 2.76 MIL/uL — ABNORMAL LOW (ref 3.87–5.11)
RDW: 16.3 % — ABNORMAL HIGH (ref 11.5–15.5)
WBC: 4.7 K/uL (ref 4.0–10.5)
nRBC: 0 % (ref 0.0–0.2)

## 2023-10-25 LAB — MAGNESIUM: Magnesium: 1.8 mg/dL (ref 1.7–2.4)

## 2023-10-25 LAB — TSH: TSH: 0.539 u[IU]/mL (ref 0.350–4.500)

## 2023-10-25 MED ORDER — SODIUM CHLORIDE 0.9 % IV SOLN: Freq: Once | INTRAVENOUS | Status: AC

## 2023-10-25 MED ORDER — HEPARIN SOD (PORK) LOCK FLUSH 100 UNIT/ML IV SOLN
500.0000 [IU] | Freq: Once | INTRAVENOUS | Status: AC | PRN
  Administered 2023-10-25: 500 [IU]

## 2023-10-25 MED ORDER — SODIUM CHLORIDE 0.9% FLUSH
10.0000 mL | INTRAVENOUS | Status: DC | PRN
  Administered 2023-10-25: 10 mL via INTRAVENOUS

## 2023-10-25 MED ORDER — SODIUM CHLORIDE 0.9 % IV SOLN
1500.0000 mg | Freq: Once | INTRAVENOUS | Status: AC
  Administered 2023-10-25: 1500 mg via INTRAVENOUS
  Filled 2023-10-25: qty 30

## 2023-10-25 NOTE — Patient Instructions (Signed)

## 2023-10-25 NOTE — Progress Notes (Signed)
 Patient has been examined by Dr. Ellin Saba. Vital signs and labs have been reviewed by MD - ANC, Creatinine, LFTs, hemoglobin, and platelets are within treatment parameters per M.D. - pt may proceed with treatment.  Primary RN and pharmacy notified.

## 2023-10-25 NOTE — Progress Notes (Signed)
 [facility] [address] [place] [address]    Clinic Day:  10/25/2023  Referring physician: [name], MD  Patient Care Team: [name], MD as PCP - General (Internal Medicine) [name], MD as PCP - Cardiology (Cardiology) [name], MD as Consulting Physician (Gastroenterology) Rogers Hai, MD as Medical Oncologist (Medical Oncology) [name], RN as Oncology Nurse Navigator (Medical Oncology)   ASSESSMENT & PLAN:   Assessment: 1.  Recurrent extensive stage small cell lung cancer: - Patient seen at the request of Dr. Gatha to get care closer to home. - 4 cycles of carboplatin  and etoposide  with concurrent radiation therapy for limited stage small cell lung cancer on 11/14/2009 - Status post PCI completed on 01/28/2010 - Wedge resection of the RUL by Dr. Sherrine on 09/20/2013, pathology consistent with small cell lung cancer. - 4 cycles of carboplatin  and etoposide  from 10/30/2013 through 01/08/2014 - PET scan on 04/23/2012: Marked hypermetabolism in the medial and anterior aspect of the left upper lobe.  Several hypermetabolic lung nodules in the left lung.  9 mm lesion in the left lower lobe.  No metastatic disease in the AP. - Bronchoscopy and FNA of the LUL nodule and LUL brushing and lavage with no malignant cells. - CT chest (08/03/2022): Scattered new nodular densities in the lungs bilaterally, largest in the left lower lobe.  New subcentimeter hypodense lesion in the left hepatic lobe 7 mm.  Periportal adenopathy 2.3 x 2.6 cm. - MRI of brain results from 08/14/2022: No metastatic disease.  Progression of small vessel ischemic disease. - PET scan from 08/13/2022 showed radiation changes in left upper lobe with focal hypermetabolism suggestive of residual recurrent tumor although this is improved from prior PET.  Dominant 8 mm subpleural nodule in the left lower lobe unchanged from recent CT and new from prior PET.  Metastatic  disease not excluded although infection/inflammation is also possible.  Prior hypermetabolic nodules on PET have resolved.  No evidence of metastatic disease in the abdomen or pelvis. - Cycle 1 of carboplatin /etoposide /durvalumab  on 08/17/2022, cycle 4 on 11/02/2022.  Maintenance durvalumab  started on 11/23/2022.   2.  Social/family history: - She moved back to [place] recently and lives with her husband.  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  She also worked at [employer] for 15 years.  Quit smoking in 2010 and smoked 1 pack/day for 30 years. - Mother had brain tumor.  Maternal aunt had breast cancer.    Plan: 1.  Recurrent extensive stage small cell lung cancer: - She is tolerating monthly durvalumab  very well. - Last CT CAP on 08/20/2023: Posttreatment changes with no evidence of recurrence. - She did have COVID infection in June, uncomplicated. - She denies any immunotherapy related side effects. - Labs today: Normal LFTs, creatinine and lites.  CBC D shows mild macrocytic anemia which is stable.  Previous nutritional deficiency workup was negative.  TSH is normal at 0.5. - Recommend continuing durvalumab  today and in 4 weeks.  RTC 8 weeks with repeat CT chest.  Will do brain imaging once a year.   2.  Gastroparesis: - Continue to take Reglan  5 mg 3 times daily prior to meals.  Weight is stable at 82 pounds.  She has not vomited in the last 2 months.   3.  Hypomagnesemia: - Continue magnesium  daily.  Magnesium  is normal.   4.  Skin rash: - Previously seen erythematous maculopapular  blanching rash predominantly on the trunk and upper thighs has resolved.    No orders of the defined types were placed in this encounter.    Claudia [name],acting as a Neurosurgeon for Alean Stands, MD.,have documented all relevant documentation on the behalf of Alean Stands, MD,as directed by  Alean Stands, MD while in the presence of Alean Stands,  MD.  I, Alean Stands MD, have reviewed the above documentation for accuracy and completeness, and I agree with the above.      Alean Stands, MD   7/28/20252:20 PM  CHIEF COMPLAINT:   Diagnosis: recurrent small cell lung cancer    Cancer Staging  Small cell lung cancer (HCC) Staging form: Lung, AJCC 7th Edition - Clinical: Limited Stage Small cell Lung Cancer - Signed by Sherrod Sherrod, MD on 05/10/2013    Prior Therapy: 1. Concurrent chemoradiation with carboplatin  and etoposide , completed 11/14/2009.  2. Prophylactic cranial irradiation, completed 01/28/2010. 3. RUL wedge resection on 09/21/2013 (Dr. Lucas) 4. 4 cycles of carboplatin  and etoposide , 10/30/2013 - 01/10/2014  Current Therapy:  Carboplatin , etoposide  and durvalumab     HISTORY OF PRESENT ILLNESS:   Oncology History  Small cell lung cancer (HCC)  04/10/2011 Initial Diagnosis   Small cell lung cancer (HCC)   08/17/2022 -  Chemotherapy   Patient is on Treatment Plan : LUNG SMALL CELL EXTENSIVE STAGE Durvalumab  + Carboplatin  D1 + Etoposide  D1-3 q21d x 4 Cycles / Durvalumab  q28d        INTERVAL HISTORY:   Claudia [name] is a 78 y.o. female seen for follow-up of recurrent small cell lung cancer. She was last seen by me on 09/27/2023.  Today, she states that she is doing well overall. Her appetite level is at 70%. Her energy level is at 70%.   PAST MEDICAL HISTORY:   Past Medical History: Past Medical History:  Diagnosis Date   Achalasia    Anemia    Arthritis    Bradycardia    mild,may be due to beta blocker therapy   COPD (chronic obstructive pulmonary disease) (HCC)    Gastroparesis    GERD (gastroesophageal reflux disease)    otc   Hypertension 06/01/2019   pt denies   Local recurrence of lung cancer (HCC) dx'd 07/2013   rt thoracotomy chemo comp 12/2013   Lung cancer (HCC) 03/30/2008   Dr. Sherrod, finished chemo, sp radiation, left upper    Lymphocytic colitis    Pneumonia     Surgical  History: Past Surgical History:  Procedure Laterality Date   BRONCHIAL BIOPSY  05/28/2022   Procedure: BRONCHIAL BIOPSIES;  Surgeon: Gladis Leonor HERO, MD;  Location[facility] ENDOSCOPY;  Service: Pulmonary;;   BRONCHIAL BRUSHINGS  05/28/2022   Procedure: BRONCHIAL BRUSHINGS;  Surgeon: Gladis Leonor HERO, MD;  Location: [facility] ENDOSCOPY;  Service: Pulmonary;;   BRONCHIAL NEEDLE ASPIRATION BIOPSY  05/28/2022   Procedure: BRONCHIAL NEEDLE ASPIRATION BIOPSIES;  Surgeon: Gladis Leonor HERO, MD;  Location: [facility] ENDOSCOPY;  Service: Pulmonary;;   BRONCHIAL WASHINGS  05/28/2022   Procedure: BRONCHIAL WASHINGS;  Surgeon: Gladis Leonor HERO, MD;  Location: [facility] ENDOSCOPY;  Service: Pulmonary;;   BRONCHOSCOPY  2011   DILATION AND CURETTAGE OF UTERUS     ESOPHAGOGASTRODUODENOSCOPY (EGD) WITH PROPOFOL  N/A 07/29/2021   Procedure: ESOPHAGOGASTRODUODENOSCOPY (EGD) WITH PROPOFOL ;  Surgeon: Eartha Angelia Sieving, MD;  Location: [facility] ENDO SUITE;  Service: Gastroenterology;  Laterality: N/A;  1235 ASA 2   HEMOSTASIS CONTROL  05/28/2022   Procedure: HEMOSTASIS CONTROL;  Surgeon: Gladis Leonor HERO, MD;  Location: [facility] ENDOSCOPY;  Service: Pulmonary;;   IR IMAGING GUIDED PORT INSERTION  08/12/2022   NECK SURGERY  1980's   THORACOTOMY Right 09/21/2013   Procedure: THORACOTOMY MAJOR;  Surgeon: Dorise MARLA Fellers, MD;  Location: [facility] OR;  Service: Thoracic;  Laterality: Right;   UTERINE FIBROID SURGERY  2012   WEDGE RESECTION Right 09/21/2013   Procedure: RIGHT UPPER LOBE WEDGE RESECTION;  Surgeon: Dorise MARLA Fellers, MD;  Location: [facility] OR;  Service: Thoracic;  Laterality: Right;    Social History: Social History   Socioeconomic History   Marital status: Married    Spouse name: Not on file   Number of children: Not on file   Years of education: Not on file   Highest education level: Not on file  Occupational History   Occupation: clerical    Employer: [employer]  Tobacco Use   Smoking status: Former    Current packs/day: 0.00     Average packs/day: 1 pack/day for 35.0 years (35.0 ttl pk-yrs)    Types: Cigarettes    Start date: 03/30/1972    Quit date: 03/31/2007    Years since quitting: 16.5    Passive exposure: Past   Smokeless tobacco: Never  Vaping Use   Vaping status: Never Used  Substance and Sexual Activity   Alcohol  use: Yes    Comment: occassional about 3 drinks per year   Drug use: No   Sexual activity: Never  Other Topics Concern   Not on file  Social History Narrative   Not on file   Social Drivers of Health   Financial Resource Strain: Not on file  Food Insecurity: No Food Insecurity (08/06/2022)   Hunger Vital Sign    Worried About Running Out of Food in the Last Year: Never true    Ran Out of Food in the Last Year: Never true  Transportation Needs: No Transportation Needs (08/06/2022)   PRAPARE - Administrator, Civil Service (Medical): No    Lack of Transportation (Non-Medical): No  Physical Activity: Not on file  Stress: Not on file  Social Connections: Not on file  Intimate Partner Violence: Not At Risk (08/06/2022)   Humiliation, Afraid, Rape, and Kick questionnaire    Fear of Current or Ex-Partner: No    Emotionally Abused: No    Physically Abused: No    Sexually Abused: No    Family History: Family History  Problem Relation Age of Onset   Brain cancer Mother        brain cancer   Emphysema Father    Diabetes Son    Heart disease Paternal Grandfather    Breast cancer Maternal Aunt     Current Medications:  Current Outpatient Medications:    Calcium  Carbonate Antacid (TUMS PO), Take 500-1,000 mg by mouth 3 (three) times daily as needed (indigestion/heartburn.)., Disp: , Rfl:    magnesium  oxide (MAG-OX) 400 (240 Mg) MG tablet, TAKE 1 TABLET BY MOUTH DAILY, Disp: 60 tablet, Rfl: 2   metoCLOPramide  (REGLAN ) 5 MG tablet, Take 1 tablet (5 mg total) by mouth 3 (three) times daily before meals., Disp: 180 tablet, Rfl: 3   Prucalopride Succinate  1 MG TABS, Take 1 mg by  mouth daily., Disp: 30 tablet, Rfl: 1   tretinoin (RETIN-A) 0.1 % cream, Apply 1 application  topically at bedtime., Disp: , Rfl:    Allergies: Allergies  Allergen Reactions   Azithromycin Shortness Of Breath and Rash    Took Tussionex at same time Jan 2013.  Tussionex Pennkinetic Er [Hydrocod Poli-Chlorphe Poli Er] Shortness Of Breath and Rash    Took Azithromycin at same time Jan 2013   Omeprazole  Rash    REVIEW OF SYSTEMS:   Review of Systems  Constitutional:  Negative for chills, fatigue and fever.  HENT:   Negative for lump/mass, mouth sores, nosebleeds, sore throat and trouble swallowing.   Eyes:  Negative for eye problems.  Respiratory:  Negative for cough and shortness of breath.   Cardiovascular:  Negative for chest pain, leg swelling and palpitations.  Gastrointestinal:  Negative for abdominal pain, constipation, diarrhea, nausea and vomiting.  Genitourinary:  Negative for bladder incontinence, difficulty urinating, dysuria, frequency, hematuria and nocturia.   Musculoskeletal:  Negative for arthralgias, back pain, flank pain, myalgias and neck pain.  Skin:  Negative for itching and rash.  Neurological:  Negative for dizziness, headaches and numbness.  Hematological:  Does not bruise/bleed easily.  Psychiatric/Behavioral:  Negative for depression, sleep disturbance and suicidal ideas. The patient is not nervous/anxious.   All other systems reviewed and are negative.    VITALS:   There were no vitals taken for this visit.  Wt Readings from Last 3 Encounters:  10/25/23 82 lb 14.3 oz (37.6 kg)  09/27/23 82 lb 0.2 oz (37.2 kg)  09/24/23 84 lb 12.8 oz (38.5 kg)    There is no height or weight on file to calculate BMI.  Performance status (ECOG): 1 - Symptomatic but completely ambulatory  PHYSICAL EXAM:   Physical Exam Vitals and nursing note reviewed. Exam conducted with a chaperone present.  Constitutional:      Appearance: Normal appearance.  Cardiovascular:      Rate and Rhythm: Normal rate and regular rhythm.     Pulses: Normal pulses.     Heart sounds: Normal heart sounds.  Pulmonary:     Effort: Pulmonary effort is normal.     Breath sounds: Normal breath sounds.  Abdominal:     Palpations: Abdomen is soft. There is no hepatomegaly, splenomegaly or mass.     Tenderness: There is no abdominal tenderness.  Musculoskeletal:     Right lower leg: No edema.     Left lower leg: No edema.  Lymphadenopathy:     Cervical: No cervical adenopathy.     Right cervical: No superficial, deep or posterior cervical adenopathy.    Left cervical: No superficial, deep or posterior cervical adenopathy.     Upper Body:     Right upper body: No supraclavicular or axillary adenopathy.     Left upper body: No supraclavicular or axillary adenopathy.  Neurological:     General: No focal deficit present.     Mental Status: She is alert and oriented to person, place, and time.  Psychiatric:        Mood and Affect: Mood normal.        Behavior: Behavior normal.     LABS:      Latest Ref Rng & Units 10/25/2023   12:56 PM 09/27/2023   11:49 AM 08/30/2023    1:12 PM  CBC  WBC 4.0 - 10.5 K/uL 4.7  8.5  8.2   Hemoglobin 12.0 - 15.0 g/dL 9.1  9.8  9.5   Hematocrit 36.0 - 46.0 % 27.8  29.8  28.8   Platelets 150 - 400 K/uL 129  118  132       Latest Ref Rng & Units 10/25/2023   12:56 PM 09/27/2023   11:49 AM 08/30/2023    1:08 PM  CMP  Glucose 70 - 99 mg/dL 82  890  895   BUN 8 - 23 mg/dL 14  14  19    Creatinine 0.44 - 1.00 mg/dL 9.26  9.26  9.22   Sodium 135 - 145 mmol/L 138  135  136   Potassium 3.5 - 5.1 mmol/L 4.1  4.0  4.3   Chloride 98 - 111 mmol/L 107  100  101   CO2 22 - 32 mmol/L 26  27  26    Calcium  8.9 - 10.3 mg/dL 9.5  9.5  9.3   Total Protein 6.5 - 8.1 g/dL 6.5  6.3  6.5   Total Bilirubin 0.0 - 1.2 mg/dL 0.7  1.3  0.3   Alkaline Phos 38 - 126 U/L 66  62  64   AST 15 - 41 U/L 14  14  13    ALT 0 - 44 U/L 7  7  7       No results found for:  CEA1, CEA / No results found for: CEA1, CEA No results found for: PSA1 No results found for: CAN199 No results found for: CAN125  No results found for: TOTALPROTELP, ALBUMINELP, A1GS, A2GS, BETS, BETA2SER, GAMS, MSPIKE, SPEI Lab Results  Component Value Date   TIBC 167 (L) 09/27/2023   TIBC 238 (L) 08/17/2022   TIBC 149 (L) 10/08/2021   FERRITIN 685 (H) 09/27/2023   FERRITIN 296 08/17/2022   FERRITIN 675 (H) 10/08/2021   IRONPCTSAT 11 09/27/2023   IRONPCTSAT 21 08/17/2022   IRONPCTSAT 9 (L) 10/08/2021   No results found for: LDH   STUDIES:   No results found.

## 2023-10-25 NOTE — Patient Instructions (Signed)
 [facility] [place] - A DEPT OF [facility]  Discharge Instructions: Thank you for choosing [facility] to provide your oncology and hematology care.  If you have a lab appointment with the Cancer Center - please note that after April 8th, 2024, all labs will be drawn in the cancer center.  You do not have to check in or register with the main entrance as you have in the past but will complete your check-in in the cancer center.  Wear comfortable clothing and clothing appropriate for easy access to any Portacath or PICC line.   We strive to give you quality time with your provider. You may need to reschedule your appointment if you arrive late (15 or more minutes).  Arriving late affects you and other patients whose appointments are after yours.  Also, if you miss three or more appointments without notifying the office, you may be dismissed from the clinic at the provider's discretion.      For prescription refill requests, have your pharmacy contact our office and allow 72 hours for refills to be completed.    Today you received the following chemotherapy and/or immunotherapy agents imfiniz   To help prevent nausea and vomiting after your treatment, we encourage you to take your nausea medication as directed.  BELOW ARE SYMPTOMS THAT SHOULD BE REPORTED IMMEDIATELY: *FEVER GREATER THAN 100.4 F (38 C) OR HIGHER *CHILLS OR SWEATING *NAUSEA AND VOMITING THAT IS NOT CONTROLLED WITH YOUR NAUSEA MEDICATION *UNUSUAL SHORTNESS OF BREATH *UNUSUAL BRUISING OR BLEEDING *URINARY PROBLEMS (pain or burning when urinating, or frequent urination) *BOWEL PROBLEMS (unusual diarrhea, constipation, pain near the anus) TENDERNESS IN MOUTH AND THROAT WITH OR WITHOUT PRESENCE OF ULCERS (sore throat, sores in mouth, or a toothache) UNUSUAL RASH, SWELLING OR PAIN  UNUSUAL VAGINAL DISCHARGE OR ITCHING   Items with * indicate a potential emergency and should be followed up as  soon as possible or go to the Emergency Department if any problems should occur.  Please show the CHEMOTHERAPY ALERT CARD or IMMUNOTHERAPY ALERT CARD at check-in to the Emergency Department and triage nurse.  Should you have questions after your visit or need to cancel or reschedule your appointment, please contact [facility] [place] - A DEPT OF [facility] [phone]  and follow the prompts.  Office hours are 8:00 a.m. to 4:30 p.m. Monday - Friday. Please note that voicemails left after 4:00 p.m. may not be returned until the following business day.  We are closed weekends and major holidays. You have access to a nurse at all times for urgent questions. Please call the main number to the clinic [phone] and follow the prompts.  For any non-urgent questions, you may also contact your provider using MyChart. We now offer e-Visits for anyone 38 and older to request care online for non-urgent symptoms. For details visit [email or web address].   Also download the MyChart app! Go to the app store, search "MyChart", open the app, select [facility], and log in with your MyChart username and password.

## 2023-10-25 NOTE — Progress Notes (Signed)
 Patient tolerated treatment well with no complaints voiced.  Patient left ambulatory in stable condition.  Vital signs stable at discharge.  Follow up as scheduled.

## 2023-10-26 ENCOUNTER — Other Ambulatory Visit: Payer: Self-pay

## 2023-11-15 DIAGNOSIS — I4891 Unspecified atrial fibrillation: Secondary | ICD-10-CM | POA: Diagnosis not present

## 2023-11-15 DIAGNOSIS — C3490 Malignant neoplasm of unspecified part of unspecified bronchus or lung: Secondary | ICD-10-CM | POA: Diagnosis not present

## 2023-11-15 DIAGNOSIS — Z0000 Encounter for general adult medical examination without abnormal findings: Secondary | ICD-10-CM | POA: Diagnosis not present

## 2023-11-15 DIAGNOSIS — E46 Unspecified protein-calorie malnutrition: Secondary | ICD-10-CM | POA: Diagnosis not present

## 2023-11-15 DIAGNOSIS — Z1339 Encounter for screening examination for other mental health and behavioral disorders: Secondary | ICD-10-CM | POA: Diagnosis not present

## 2023-11-15 DIAGNOSIS — Z299 Encounter for prophylactic measures, unspecified: Secondary | ICD-10-CM | POA: Diagnosis not present

## 2023-11-15 DIAGNOSIS — Z7189 Other specified counseling: Secondary | ICD-10-CM | POA: Diagnosis not present

## 2023-11-15 DIAGNOSIS — Z1331 Encounter for screening for depression: Secondary | ICD-10-CM | POA: Diagnosis not present

## 2023-11-18 ENCOUNTER — Inpatient Hospital Stay

## 2023-11-18 ENCOUNTER — Inpatient Hospital Stay: Admitting: Oncology

## 2023-11-22 ENCOUNTER — Encounter: Payer: Self-pay | Admitting: Oncology

## 2023-11-22 ENCOUNTER — Inpatient Hospital Stay

## 2023-11-22 ENCOUNTER — Inpatient Hospital Stay: Attending: Nurse Practitioner

## 2023-11-22 VITALS — BP 124/69 | HR 74 | Temp 96.6°F | Resp 18

## 2023-11-22 DIAGNOSIS — C3411 Malignant neoplasm of upper lobe, right bronchus or lung: Secondary | ICD-10-CM | POA: Insufficient documentation

## 2023-11-22 DIAGNOSIS — Z5112 Encounter for antineoplastic immunotherapy: Secondary | ICD-10-CM | POA: Diagnosis not present

## 2023-11-22 DIAGNOSIS — Z79620 Long term (current) use of immunosuppressive biologic: Secondary | ICD-10-CM | POA: Insufficient documentation

## 2023-11-22 LAB — CBC WITH DIFFERENTIAL/PLATELET
Abs Immature Granulocytes: 0.07 K/uL (ref 0.00–0.07)
Basophils Absolute: 0 K/uL (ref 0.0–0.1)
Basophils Relative: 1 %
Eosinophils Absolute: 0 K/uL (ref 0.0–0.5)
Eosinophils Relative: 0 %
HCT: 29.5 % — ABNORMAL LOW (ref 36.0–46.0)
Hemoglobin: 9.7 g/dL — ABNORMAL LOW (ref 12.0–15.0)
Immature Granulocytes: 2 %
Lymphocytes Relative: 8 %
Lymphs Abs: 0.4 K/uL — ABNORMAL LOW (ref 0.7–4.0)
MCH: 33.1 pg (ref 26.0–34.0)
MCHC: 32.9 g/dL (ref 30.0–36.0)
MCV: 100.7 fL — ABNORMAL HIGH (ref 80.0–100.0)
Monocytes Absolute: 0.8 K/uL (ref 0.1–1.0)
Monocytes Relative: 16 %
Neutro Abs: 3.5 K/uL (ref 1.7–7.7)
Neutrophils Relative %: 73 %
Platelets: 104 K/uL — ABNORMAL LOW (ref 150–400)
RBC: 2.93 MIL/uL — ABNORMAL LOW (ref 3.87–5.11)
RDW: 15.5 % (ref 11.5–15.5)
WBC: 4.8 K/uL (ref 4.0–10.5)
nRBC: 0 % (ref 0.0–0.2)

## 2023-11-22 LAB — COMPREHENSIVE METABOLIC PANEL WITH GFR
ALT: 8 U/L (ref 0–44)
AST: 12 U/L — ABNORMAL LOW (ref 15–41)
Albumin: 3.4 g/dL — ABNORMAL LOW (ref 3.5–5.0)
Alkaline Phosphatase: 64 U/L (ref 38–126)
Anion gap: 6 (ref 5–15)
BUN: 14 mg/dL (ref 8–23)
CO2: 27 mmol/L (ref 22–32)
Calcium: 9.5 mg/dL (ref 8.9–10.3)
Chloride: 105 mmol/L (ref 98–111)
Creatinine, Ser: 0.71 mg/dL (ref 0.44–1.00)
GFR, Estimated: >60 mL/min (ref >60)
Glucose, Bld: 101 mg/dL — ABNORMAL HIGH (ref 70–99)
Potassium: 3.9 mmol/L (ref 3.5–5.1)
Sodium: 138 mmol/L (ref 135–145)
Total Bilirubin: 0.7 mg/dL (ref 0.0–1.2)
Total Protein: 6.5 g/dL (ref 6.5–8.1)

## 2023-11-22 LAB — MAGNESIUM: Magnesium: 1.8 mg/dL (ref 1.7–2.4)

## 2023-11-22 LAB — TSH: TSH: 1.837 u[IU]/mL (ref 0.350–4.500)

## 2023-11-22 MED ORDER — SODIUM CHLORIDE 0.9 % IV SOLN: Freq: Once | INTRAVENOUS | Status: AC

## 2023-11-22 MED ORDER — SODIUM CHLORIDE 0.9 % IV SOLN
1500.0000 mg | Freq: Once | INTRAVENOUS | Status: AC
  Administered 2023-11-22: 1500 mg via INTRAVENOUS
  Filled 2023-11-22: qty 30

## 2023-11-22 NOTE — Progress Notes (Signed)
 Labs reviewed today. Ok to treat per parameters. Pt. Weight noted.    Treatment given per orders. Patient tolerated it well without problems. Vitals stable and discharged home from clinic ambulatory. Follow up as scheduled.

## 2023-11-22 NOTE — Patient Instructions (Signed)
 [facility]  - A DEPT OF [facility]  Discharge Instructions: Thank you for choosing [facility] to provide your oncology and hematology care.  If you have a lab appointment with the Cancer Center - please note that after April 8th, 2024, all labs will be drawn in the cancer center.  You do not have to check in or register with the main entrance as you have in the past but will complete your check-in in the cancer center.  Wear comfortable clothing and clothing appropriate for easy access to any Portacath or PICC line.   We strive to give you quality time with your provider. You may need to reschedule your appointment if you arrive late (15 or more minutes).  Arriving late affects you and other patients whose appointments are after yours.  Also, if you miss three or more appointments without notifying the office, you may be dismissed from the clinic at the provider's discretion.      For prescription refill requests, have your pharmacy contact our office and allow 72 hours for refills to be completed.    Today you received the following chemotherapy and/or immunotherapy agents Durvalumab    To help prevent nausea and vomiting after your treatment, we encourage you to take your nausea medication as directed.  BELOW ARE SYMPTOMS THAT SHOULD BE REPORTED IMMEDIATELY: *FEVER GREATER THAN 100.4 F (38 C) OR HIGHER *CHILLS OR SWEATING *NAUSEA AND VOMITING THAT IS NOT CONTROLLED WITH YOUR NAUSEA MEDICATION *UNUSUAL SHORTNESS OF BREATH *UNUSUAL BRUISING OR BLEEDING *URINARY PROBLEMS (pain or burning when urinating, or frequent urination) *BOWEL PROBLEMS (unusual diarrhea, constipation, pain near the anus) TENDERNESS IN MOUTH AND THROAT WITH OR WITHOUT PRESENCE OF ULCERS (sore throat, sores in mouth, or a toothache) UNUSUAL RASH, SWELLING OR PAIN  UNUSUAL VAGINAL DISCHARGE OR ITCHING   Items with * indicate a potential emergency and should be followed up  as soon as possible or go to the Emergency Department if any problems should occur.  Please show the CHEMOTHERAPY ALERT CARD or IMMUNOTHERAPY ALERT CARD at check-in to the Emergency Department and triage nurse.  Should you have questions after your visit or need to cancel or reschedule your appointment, please contact [facility]  - A DEPT OF [facility] [phone]  and follow the prompts.  Office hours are 8:00 a.m. to 4:30 p.m. Monday - Friday. Please note that voicemails left after 4:00 p.m. may not be returned until the following business day.  We are closed weekends and major holidays. You have access to a nurse at all times for urgent questions. Please call the main number to the clinic [phone] and follow the prompts.  For any non-urgent questions, you may also contact your provider using MyChart. We now offer e-Visits for anyone 39 and older to request care online for non-urgent symptoms. For details visit [email or web address].   Also download the MyChart app! Go to the app store, search MyChart, open the app, select [place], and log in with your MyChart username and password.

## 2023-11-25 ENCOUNTER — Ambulatory Visit (INDEPENDENT_AMBULATORY_CARE_PROVIDER_SITE_OTHER): Admitting: Gastroenterology

## 2023-11-25 ENCOUNTER — Encounter (INDEPENDENT_AMBULATORY_CARE_PROVIDER_SITE_OTHER): Payer: Self-pay | Admitting: Gastroenterology

## 2023-11-25 VITALS — BP 130/75 | HR 80 | Temp 98.0°F | Ht 61.5 in | Wt 84.6 lb

## 2023-11-25 DIAGNOSIS — K3184 Gastroparesis: Secondary | ICD-10-CM

## 2023-11-25 DIAGNOSIS — K224 Dyskinesia of esophagus: Secondary | ICD-10-CM | POA: Diagnosis not present

## 2023-11-25 DIAGNOSIS — E440 Moderate protein-calorie malnutrition: Secondary | ICD-10-CM | POA: Diagnosis not present

## 2023-11-25 NOTE — Progress Notes (Signed)
 Referring Provider: [name], MD Primary Care Physician:  Claudia Leta NOVAK, MD Primary GI Physician: Dr. Eartha   Chief Complaint  Patient presents with   Follow-up    Patient here today for a follow up on Protein and calorie malnutrition. Patient says she tries to drink an ensure 1/2 to one per day,but she has not had any this week. Appetite is still lacking will eat have of a cheese burger and she feels full.     HPI:   Claudia [name] is a 78 y.o. female with past medical history of recurrent small cell lung cancer s/p chemo with carboplatin  and etoposide , s/p radiation therapy and prophylactic cranial radiation, recurrent malignancy was treated with wedge resection, possible lymphocytic colitis, COPD, GERD, severe gastroparesis, possible achalasia   Patient presenting today for:  Follow up of gastroparesis, esophageal dysmotility, protein calorie malnutrition  Last seen in June, at that time spitting up was improved, not taking omeprazole , using reglan  x1 in the morning and sometimes again in the evening. Weight fluctuating a pound or two up and down but still no significant gain. Appetite low, constipation well controlled recently, using stool softener PRN  Recommended continue reglan  5mg  1-2 times per day, start prucalopride 1mg  daily, consider stopping reglan  if improving with prucalopride, high calorie/high protein diet, consider domperidone if no improvement with prucalopride  Present:  Seems to be doing better. Not having much episodes of spitting up/vomiting except this morning when she drank a different kind of protein shake that did not agree with her. Denies nausea, no dysphagia, no heartburn. Husband states appetite is not great. She eats about 2 meals per day with a snack. Cannot tolerate larger meals as she feels full easily.  Husband reports she often will eat half of her food. She is trying to do atleast 1/2 protein shake per day. She has 1 BM per day that is looser  but no incontinence, she does report taking magnesium  which she thinks keeps her bowels moving well. Did not start prucalopride as it was not covered by her insurance. She thinks she is only taking reglan  only once per day as she notes she does not want to delay eating if she is hungry and was told she should take it atleast 30 minutes prior to eating. She also notes that she cannot drink a lot of liquid while eating as it makes her feel sick, therefore usually takes only small sips of liquids during meals, otherwise can tolerate liquids not at times of meals.   No red flag symptoms. Patient denies melena, hematochezia, nausea, vomiting, diarrhea, constipation, dysphagia, odynophagia.   Barium esophagram 04/2022: concerning for achalasia  Last Colonoscopy:Last Colonoscopy:2013 - sigmoid diverticulosis, patchy loss of vascular marking, biopsies performed but no results available Last Endoscopy:5/2/23Fluid in the esophagus. Fluid aspiration performed. - A large amount of food (residue) in the stomach. - Normal examined duodenum GES:08/13/21: severe delay in gastric emptying, recommend eat small meals throughout the day   Past Medical History:  Diagnosis Date   Achalasia    Anemia    Arthritis    Bradycardia    mild,may be due to beta blocker therapy   COPD (chronic obstructive pulmonary disease) (HCC)    Gastroparesis    GERD (gastroesophageal reflux disease)    otc   Hypertension 06/01/2019   pt denies   Local recurrence of lung cancer (HCC) dx'd 07/2013   rt thoracotomy chemo comp 12/2013   Lung cancer (HCC) 03/30/2008   Dr.  Mohamed, finished chemo, sp radiation, left upper    Lymphocytic colitis    Pneumonia     Past Surgical History:  Procedure Laterality Date   BRONCHIAL BIOPSY  05/28/2022   Procedure: BRONCHIAL BIOPSIES;  Surgeon: Gladis Leonor HERO, MD;  Location: [facility] ENDOSCOPY;  Service: Pulmonary;;   BRONCHIAL BRUSHINGS  05/28/2022   Procedure: BRONCHIAL BRUSHINGS;  Surgeon:  Gladis Leonor HERO, MD;  Location: [facility] ENDOSCOPY;  Service: Pulmonary;;   BRONCHIAL NEEDLE ASPIRATION BIOPSY  05/28/2022   Procedure: BRONCHIAL NEEDLE ASPIRATION BIOPSIES;  Surgeon: Gladis Leonor HERO, MD;  Location: [facility] ENDOSCOPY;  Service: Pulmonary;;   BRONCHIAL WASHINGS  05/28/2022   Procedure: BRONCHIAL WASHINGS;  Surgeon: Gladis Leonor HERO, MD;  Location: [facility] ENDOSCOPY;  Service: Pulmonary;;   BRONCHOSCOPY  2011   DILATION AND CURETTAGE OF UTERUS     ESOPHAGOGASTRODUODENOSCOPY (EGD) WITH PROPOFOL  N/A 07/29/2021   Procedure: ESOPHAGOGASTRODUODENOSCOPY (EGD) WITH PROPOFOL ;  Surgeon: Eartha Angelia Sieving, MD;  Location: [facility] ENDO SUITE;  Service: Gastroenterology;  Laterality: N/A;  1235 ASA 2   HEMOSTASIS CONTROL  05/28/2022   Procedure: HEMOSTASIS CONTROL;  Surgeon: Gladis Leonor HERO, MD;  Location: [facility] ENDOSCOPY;  Service: Pulmonary;;   IR IMAGING GUIDED PORT INSERTION  08/12/2022   NECK SURGERY  1980's   THORACOTOMY Right 09/21/2013   Procedure: THORACOTOMY MAJOR;  Surgeon: Dorise MARLA Fellers, MD;  Location: [facility] OR;  Service: Thoracic;  Laterality: Right;   UTERINE FIBROID SURGERY  2012   WEDGE RESECTION Right 09/21/2013   Procedure: RIGHT UPPER LOBE WEDGE RESECTION;  Surgeon: Dorise MARLA Fellers, MD;  Location: [facility] OR;  Service: Thoracic;  Laterality: Right;    Current Outpatient Medications  Medication Sig Dispense Refill   Calcium  Carbonate Antacid (TUMS PO) Take 500-1,000 mg by mouth 3 (three) times daily as needed (indigestion/heartburn.).     magnesium  oxide (MAG-OX) 400 (240 Mg) MG tablet TAKE 1 TABLET BY MOUTH DAILY 60 tablet 2   metoCLOPramide  (REGLAN ) 5 MG tablet Take 1 tablet (5 mg total) by mouth 3 (three) times daily before meals. 180 tablet 3   Prucalopride Succinate  1 MG TABS Take 1 mg by mouth daily. 30 tablet 1   tretinoin (RETIN-A) 0.1 % cream Apply 1 application  topically at bedtime.     No current facility-administered medications for this visit.    Allergies as of 11/25/2023  - Review Complete 10/25/2023  Allergen Reaction Noted   Azithromycin Shortness Of Breath and Rash 04/09/2011   Tussionex pennkinetic er [hydrocod poli-chlorphe poli er] Shortness Of Breath and Rash 04/09/2011   Omeprazole  Rash 11/13/2021    Social History   Socioeconomic History   Marital status: Married    Spouse name: Not on file   Number of children: Not on file   Years of education: Not on file   Highest education level: Not on file  Occupational History   Occupation: clerical    Employer: [employer]   Tobacco Use   Smoking status: Former    Current packs/day: 0.00    Average packs/day: 1 pack/day for 35.0 years (35.0 ttl pk-yrs)    Types: Cigarettes    Start date: 03/30/1972    Quit date: 03/31/2007    Years since quitting: 16.6    Passive exposure: Past   Smokeless tobacco: Never  Vaping Use   Vaping status: Never Used  Substance and Sexual Activity   Alcohol  use: Yes    Comment: occassional about 3 drinks per year   Drug use: No   Sexual activity:  Never  Other Topics Concern   Not on file  Social History Narrative   Not on file   Social Drivers of Health   Financial Resource Strain: Not on file  Food Insecurity: No Food Insecurity (08/06/2022)   Hunger Vital Sign    Worried About Running Out of Food in the Last Year: Never true    Ran Out of Food in the Last Year: Never true  Transportation Needs: No Transportation Needs (08/06/2022)   PRAPARE - Administrator, Civil Service (Medical): No    Lack of Transportation (Non-Medical): No  Physical Activity: Not on file  Stress: Not on file  Social Connections: Not on file    Review of systems General: negative for malaise, night sweats, fever, chills, weight loss Neck: Negative for lumps, goiter, pain and significant neck swelling Resp: Negative for cough, wheezing, dyspnea at rest CV: Negative for chest pain, leg swelling, palpitations, orthopnea GI: denies melena, hematochezia, nausea, vomiting,  diarrhea, constipation, dysphagia, odyonophagia, or unintentional weight loss. +early satiety  MSK: Negative for joint pain or swelling, back pain, and muscle pain. Derm: Negative for itching or rash Psych: Denies depression, anxiety, memory loss, confusion. No homicidal or suicidal ideation.  Heme: Negative for prolonged bleeding, bruising easily, and swollen nodes. Endocrine: Negative for cold or heat intolerance, polyuria, polydipsia and goiter. Neuro: negative for tremor, gait imbalance, syncope and seizures. The remainder of the review of systems is noncontributory.  Physical Exam: There were no vitals taken for this visit. General:   Alert and oriented. No distress noted. Pleasant and cooperative.  Head:  Normocephalic and atraumatic. Eyes:  Conjuctiva clear without scleral icterus. Mouth:  Oral mucosa pink and moist. Good dentition. No lesions. Heart: Normal rate and rhythm, s1 and s2 heart sounds present.  Lungs: Clear lung sounds in all lobes. Respirations equal and unlabored. Abdomen:  +BS, soft, non-tender and non-distended. No rebound or guarding. No HSM or masses noted. Derm: No palmar erythema or jaundice Msk:  Symmetrical without gross deformities. Normal posture. Extremities:  Without edema. Neurologic:  Alert and  oriented x4 Psych:  Alert and cooperative. Normal mood and affect.  Invalid input(s): 6 MONTHS   ASSESSMENT: MIKAYELA DEATS is a 78 y.o. female presenting today for follow up of gastroparesis, esophageal dysmotility and protein calorie malnutrition  No dysphagia currently. Still with low appetite and early satiety, likely secondary to her gastroparesis. She is taking reglan  only once daily. Her insurance did not cover prucalopride prescribed at last visit. Her weight Is actually up a few pounds (84 pounds today) and she is trying to get in plenty of protein. Her bowels seem to be moving well with use of magnesium . We discussed continued smaller, high  calorie, high protein meals throughout the day. I suggested even adding protein powder to her greek yogurts as this will be low volume of food but get in some extra protein/calories, can also consider things like protein water to help with addition of extra calories. I did encourage her again to try and take reglan  2-3 times per day before each meal, we discussed what reglan  does and how this may help with her appetite as it helps to prompt gastric emptying. Overall, I am happy that she is maintaining her weight, and we discussed that while she may not be gaining, we want to avoid any decline in her weight.    PLAN:  -continue high protein/high calorie diet with smaller meals throughout the day -try reglan  2-3 times  per day vs. Just once -can try adding protein powder to yogurt to get some added nutrition  -added protein/calories with protein water  -liberalize diet    All questions were answered, patient verbalized understanding and is in agreement with plan as outlined above.   Follow Up: 3 months  Keiyon Plack L. Mariette, MSN, APRN, AGNP-C Adult-Gerontology Nurse Practitioner [facility]  I have reviewed the note and agree with the APP's assessment as described in this progress note  Toribio Fortune, MD Gastroenterology and Hepatology [facility] Gastroenterology

## 2023-11-25 NOTE — Patient Instructions (Addendum)
-  continue high protein/high calorie diet -try reglan  (metoclopramide ) 2-3 times per day, before each meal  -can try adding protein powder to yogurt to get some added nutrition (just ingredients neapolitan flavor is a great one)  -can try to get added protein/calories with protein water  -liberalize diet and eat things that you enjoy/tolerate  Follow up 3 months  It was a pleasure to see you today. I want to create trusting relationships with patients and provide genuine, compassionate, and quality care. I truly value your feedback! please be on the lookout for a survey regarding your visit with me today. I appreciate your input about our visit and your time in completing this!    Tierany Appleby L. Talya Quain, MSN, APRN, AGNP-C Adult-Gerontology Nurse Practitioner [facility] Gastroenterology at [facility]

## 2023-11-26 ENCOUNTER — Other Ambulatory Visit: Payer: Self-pay

## 2023-11-26 DIAGNOSIS — L710 Perioral dermatitis: Secondary | ICD-10-CM | POA: Diagnosis not present

## 2023-11-26 DIAGNOSIS — C3490 Malignant neoplasm of unspecified part of unspecified bronchus or lung: Secondary | ICD-10-CM | POA: Diagnosis not present

## 2023-11-26 DIAGNOSIS — Z299 Encounter for prophylactic measures, unspecified: Secondary | ICD-10-CM | POA: Diagnosis not present

## 2023-11-26 DIAGNOSIS — I4891 Unspecified atrial fibrillation: Secondary | ICD-10-CM | POA: Diagnosis not present

## 2023-12-13 ENCOUNTER — Ambulatory Visit (HOSPITAL_COMMUNITY): Admission: RE | Admit: 2023-12-13 | Source: Ambulatory Visit

## 2023-12-14 ENCOUNTER — Other Ambulatory Visit: Payer: Self-pay | Admitting: Oncology

## 2023-12-14 DIAGNOSIS — C3411 Malignant neoplasm of upper lobe, right bronchus or lung: Secondary | ICD-10-CM

## 2023-12-15 ENCOUNTER — Ambulatory Visit (HOSPITAL_COMMUNITY)
Admission: RE | Admit: 2023-12-15 | Discharge: 2023-12-15 | Disposition: A | Discharge status: Home / Self Care | Source: Ambulatory Visit | Attending: Hematology | Admitting: Hematology

## 2023-12-15 DIAGNOSIS — C3490 Malignant neoplasm of unspecified part of unspecified bronchus or lung: Secondary | ICD-10-CM | POA: Diagnosis not present

## 2023-12-15 DIAGNOSIS — R9389 Abnormal findings on diagnostic imaging of other specified body structures: Secondary | ICD-10-CM | POA: Diagnosis not present

## 2023-12-15 DIAGNOSIS — J432 Centrilobular emphysema: Secondary | ICD-10-CM | POA: Diagnosis not present

## 2023-12-15 DIAGNOSIS — C3411 Malignant neoplasm of upper lobe, right bronchus or lung: Secondary | ICD-10-CM | POA: Insufficient documentation

## 2023-12-15 MED ORDER — IOHEXOL 300 MG/ML  SOLN
80.0000 mL | Freq: Once | INTRAMUSCULAR | Status: AC | PRN
  Administered 2023-12-15: 80 mL via INTRAVENOUS

## 2023-12-16 ENCOUNTER — Encounter: Payer: Self-pay | Admitting: Oncology

## 2023-12-20 ENCOUNTER — Encounter: Payer: Self-pay | Admitting: Oncology

## 2023-12-20 ENCOUNTER — Inpatient Hospital Stay

## 2023-12-20 ENCOUNTER — Inpatient Hospital Stay: Attending: Nurse Practitioner | Admitting: Oncology

## 2023-12-20 VITALS — BP 117/57 | HR 62 | Temp 98.1°F | Resp 16

## 2023-12-20 VITALS — Wt 82.7 lb

## 2023-12-20 DIAGNOSIS — C3481 Malignant neoplasm of overlapping sites of right bronchus and lung: Secondary | ICD-10-CM | POA: Insufficient documentation

## 2023-12-20 DIAGNOSIS — Z5112 Encounter for antineoplastic immunotherapy: Secondary | ICD-10-CM | POA: Diagnosis not present

## 2023-12-20 DIAGNOSIS — Z79630 Long term (current) use of alkylating agent: Secondary | ICD-10-CM | POA: Insufficient documentation

## 2023-12-20 DIAGNOSIS — Z79899 Other long term (current) drug therapy: Secondary | ICD-10-CM | POA: Diagnosis not present

## 2023-12-20 DIAGNOSIS — R21 Rash and other nonspecific skin eruption: Secondary | ICD-10-CM | POA: Diagnosis not present

## 2023-12-20 DIAGNOSIS — C3411 Malignant neoplasm of upper lobe, right bronchus or lung: Secondary | ICD-10-CM

## 2023-12-20 DIAGNOSIS — T451X5A Adverse effect of antineoplastic and immunosuppressive drugs, initial encounter: Secondary | ICD-10-CM

## 2023-12-20 DIAGNOSIS — D6481 Anemia due to antineoplastic chemotherapy: Secondary | ICD-10-CM

## 2023-12-20 LAB — CBC WITH DIFFERENTIAL/PLATELET
Abs Immature Granulocytes: 0.04 K/uL (ref 0.00–0.07)
Basophils Absolute: 0.1 K/uL (ref 0.0–0.1)
Basophils Relative: 1 %
Eosinophils Absolute: 0 K/uL (ref 0.0–0.5)
Eosinophils Relative: 0 %
HCT: 31 % — ABNORMAL LOW (ref 36.0–46.0)
Hemoglobin: 10.1 g/dL — ABNORMAL LOW (ref 12.0–15.0)
Immature Granulocytes: 1 %
Lymphocytes Relative: 7 %
Lymphs Abs: 0.4 K/uL — ABNORMAL LOW (ref 0.7–4.0)
MCH: 32.5 pg (ref 26.0–34.0)
MCHC: 32.6 g/dL (ref 30.0–36.0)
MCV: 99.7 fL (ref 80.0–100.0)
Monocytes Absolute: 0.7 K/uL (ref 0.1–1.0)
Monocytes Relative: 14 %
Neutro Abs: 3.9 K/uL (ref 1.7–7.7)
Neutrophils Relative %: 77 %
Platelets: 120 K/uL — ABNORMAL LOW (ref 150–400)
RBC: 3.11 MIL/uL — ABNORMAL LOW (ref 3.87–5.11)
RDW: 14.5 % (ref 11.5–15.5)
WBC: 5 K/uL (ref 4.0–10.5)
nRBC: 0 % (ref 0.0–0.2)

## 2023-12-20 LAB — COMPREHENSIVE METABOLIC PANEL WITH GFR
ALT: 7 U/L (ref 0–44)
AST: 14 U/L — ABNORMAL LOW (ref 15–41)
Albumin: 3.4 g/dL — ABNORMAL LOW (ref 3.5–5.0)
Alkaline Phosphatase: 61 U/L (ref 38–126)
Anion gap: 11 (ref 5–15)
BUN: 20 mg/dL (ref 8–23)
CO2: 26 mmol/L (ref 22–32)
Calcium: 9.6 mg/dL (ref 8.9–10.3)
Chloride: 102 mmol/L (ref 98–111)
Creatinine, Ser: 0.77 mg/dL (ref 0.44–1.00)
GFR, Estimated: >60 mL/min (ref >60)
Glucose, Bld: 107 mg/dL — ABNORMAL HIGH (ref 70–99)
Potassium: 3.9 mmol/L (ref 3.5–5.1)
Sodium: 139 mmol/L (ref 135–145)
Total Bilirubin: 0.8 mg/dL (ref 0.0–1.2)
Total Protein: 6.5 g/dL (ref 6.5–8.1)

## 2023-12-20 LAB — TSH: TSH: 1.745 u[IU]/mL (ref 0.350–4.500)

## 2023-12-20 LAB — MAGNESIUM: Magnesium: 1.8 mg/dL (ref 1.7–2.4)

## 2023-12-20 MED ORDER — TRIAMCINOLONE ACETONIDE 0.1 % EX CREA: 1.0000 | TOPICAL_CREAM | Freq: Two times a day (BID) | CUTANEOUS | 2 refills | Status: AC

## 2023-12-20 MED ORDER — SODIUM CHLORIDE 0.9 % IV SOLN
1500.0000 mg | Freq: Once | INTRAVENOUS | Status: AC
  Administered 2023-12-20: 1500 mg via INTRAVENOUS
  Filled 2023-12-20: qty 30

## 2023-12-20 MED ORDER — SODIUM CHLORIDE 0.9 % IV SOLN: Freq: Once | INTRAVENOUS | Status: AC

## 2023-12-20 NOTE — Assessment & Plan Note (Addendum)
 Patient has a edematous rash on her right hand.  Likely secondary to immunotherapy  - Recommended to discontinue previous medications - Will start triamcinolone  0.1% to be applied twice daily - Recommended patient to watch out for worsening of the rash

## 2023-12-20 NOTE — Progress Notes (Signed)
 Patient tolerated chemotherapy with no complaints voiced.  Side effects with management reviewed with understanding verbalized.  Port site clean and dry with no bruising or swelling noted at site.  Good blood return noted before and after administration of chemotherapy.  Band aid applied.  Patient left in satisfactory condition with VSS and no s/s of distress noted. All follow ups as scheduled.   Claudia [name] [employer]

## 2023-12-20 NOTE — Assessment & Plan Note (Addendum)
 Normocytic anemia likely secondary to lung carcinoma Iron panel within normal limits  but with elevated ferritin Borderline low vitamin B12 levels  - Start over-the-counter vitamin B12 1000 mg daily. - Will continue to monitor

## 2023-12-20 NOTE — Patient Instructions (Addendum)
 [facility] at [facility] Discharge Instructions   You were seen and examined today by Dr. Davonna.  She reviewed the results of your lab work which are normal/stable.   She reviewed the results of your CT scan which is stable.   We will proceed with your treatment today.   Return as scheduled.    Thank you for choosing [facility] at [address] [facility] to provide your oncology and hematology care.  To afford each patient quality time with our provider, please arrive at least 15 minutes before your scheduled appointment time.   If you have a lab appointment with the Cancer Center please come in thru the Main Entrance and check in at the main information desk.  You need to re-schedule your appointment should you arrive 10 or more minutes late.  We strive to give you quality time with our providers, and arriving late affects you and other patients whose appointments are after yours.  Also, if you no show three or more times for appointments you may be dismissed from the clinic at the providers discretion.     Again, thank you for choosing [facility].  Our hope is that these requests will decrease the amount of time that you wait before being seen by our physicians.       _____________________________________________________________  Should you have questions after your visit to [facility], please contact our office at [phone] and follow the prompts.  Our office hours are 8:00 a.m. and 4:30 p.m. Monday - Friday.  Please note that voicemails left after 4:00 p.m. may not be returned until the following business day.  We are closed weekends and major holidays.  You do have access to a nurse 24-7, just call the main number to the clinic [phone] and do not press any options, hold on the line and a nurse will answer the phone.    For prescription refill requests, have your pharmacy contact our office and allow 72 hours.     Due to Covid, you will need to wear a mask upon entering the hospital. If you do not have a mask, a mask will be given to you at the Main Entrance upon arrival. For doctor visits, patients may have 1 support person age 35 or older with them. For treatment visits, patients can not have anyone with them due to social distancing guidelines and our immunocompromised population.

## 2023-12-20 NOTE — Assessment & Plan Note (Addendum)
 Recurrent extensive stage small cell lung cancer.  Extensive oncology history below Completed induction chemotherapy with carboplatin , etoposide  and durvalumab  recently Has been on maintenance durvalumab  for the past 1 year. Recent CT scan stable with some findings of may be pneumonitis.  Patient is asymptomatic  -Continue durvalumab  1500mg  every 4 weeks. - Labs reviewed today: CMP: Normal creatinine, normal liver CBC: Hemoglobin: 10.1, platelets: 120, WBC: 5, TSH: 1.745 -Will repeat CT scan in 3 months. I.e 02/2024 - Will repeat MRI brain yearly.  It is due now.  Will order this.  Return to clinic in 1 month with MRI brain and for next infusion.

## 2023-12-20 NOTE — Progress Notes (Signed)
 Patient Care Team: Claudia Leta NOVAK, MD as PCP - General (Internal Medicine) Claudia Vina GAILS, MD as PCP - Cardiology (Cardiology) Claudia Lamprey, MD as Consulting Physician (Gastroenterology) Claudia Joesph SQUIBB, RN as Oncology Nurse Navigator (Medical Oncology)  Clinic Day:  12/20/2023  Referring physician: [name], MD   CHIEF COMPLAINT:  CC: Recurrent extensive stage small cell lung cancer   Claudia Claudia [name] 78 y.o. female was transferred to my care after her prior physician has left.   ASSESSMENT & PLAN:   Assessment & Plan: Claudia Claudia [name] a 78 y.o. female with recurrent extensive stage small cell lung cancer  Assessment & Plan Small cell carcinoma of overlapping sites of right lung (HCC) Recurrent extensive stage small cell lung cancer.  Extensive oncology history below Completed induction chemotherapy with carboplatin , etoposide  and durvalumab  recently Has been on maintenance durvalumab  for the past 1 year. Recent CT scan stable with some findings of may be pneumonitis.  Patient Claudia [name] asymptomatic  -Continue durvalumab  1500mg  every 4 weeks. - Labs reviewed today: CMP: Normal creatinine, normal liver CBC: Hemoglobin: 10.1, platelets: 120, WBC: 5, TSH: 1.745 -Will repeat CT scan in 3 months. I.e 02/2024 - Will repeat MRI brain yearly.  It Claudia [name] due now.  Will order this.  Return to clinic in 1 month with MRI brain and for next infusion. Anemia due to antineoplastic chemotherapy Normocytic anemia likely secondary to lung carcinoma Iron panel within normal limits  but with elevated ferritin Borderline low vitamin B12 levels  - Start over-the-counter vitamin B12 1000 mg daily. - Will continue to monitor  Rash Patient has a edematous rash on her right hand.  Likely secondary to immunotherapy  - Recommended to discontinue previous medications - Will start triamcinolone  0.1% to be applied twice daily - Recommended patient to watch out for worsening of the rash    The  patient understands the plans discussed today and Claudia [name] in agreement with them.  She knows to contact our office if she develops concerns prior to her next appointment.  100 minutes of total time was spent for this patient encounter, including preparation,review of records,  face-to-face counseling with the patient and coordination of care, physical exam, and documentation of the encounter.    Claudia Claudia [name],acting as a Neurosurgeon for Mickiel Dry, MD.,have documented all relevant documentation on the behalf of Mickiel Dry, MD,as directed by  Mickiel Dry, MD while in the presence of Mickiel Dry, MD.   I, Mickiel Dry MD, have reviewed the above documentation for accuracy and completeness, and I agree with the above.    Claudia Claudia [name]  [facility] [place] - A DEPT OF Claudia Claudia [name] [facility] [address] [place] [address] Dept: [phone] Dept Fax: [phone]   No orders of the defined types were placed in this encounter.    ONCOLOGY HISTORY:   I have reviewed her chart and materials related to her cancer extensively and collaborated history with the patient. Summary of oncologic history Claudia [name] as follows:   Diagnosis: Recurrent extensive stage small cell lung cancer  -Initial Presentation: Smoking history with unintentional weight loss and decreased appetite -08/15/2009: Chest X-ray: Left hilar/perihilar mass suspicious for primary lung carcinoma, 2.9 x 3.4 cm in size. -08/15/2009: CT chest and abdomen: Left perihilar upper lobe mass, up to 3.6 cm. The mass Claudia [name] difficult to measure due to its branching and oblique orientation within the left upper lobe as well as its  proximity to the enlarged left hilar lymph nodes. Findings most compatible with primary lung carcinoma. Tiny low density lesion posteriorly in the liver, too small to characterize.  -08/30/2009: LUL Lung biopsy and FNA.  Pathology: Small cell undifferentiated  carcinoma. Positive malignant cells consistent with small cell carcinoma on FNA.  -09/04/2009: CT Head: No evidence of metastatic disease.  -09/04/2009: Initial PET: The central left upper lobe mass with left hilar lymphadenopathy shows neoplastic range FDG accumulation.  No evidence for hypermetabolism in the subcarinal station or contralateral mediastinum/hilum.  No distant hypermetabolic disease to suggest distant metastases.  -11/14/2009: Concurrent chemoradiation with 4 cycles of carboplatin  and etoposide  completed -11/29/2009: CT chest: The previously described left hilar mass and adjacent lymph node are no longer seen.  Several sub centimeter pulmonary nodules are noted without significant change compared to prior exam.  -11/2009-01/31/2013: Patient under surveillance, NED seen on imaging -12/30/2009: MRI brain: No evidence of intracranial metastatic disease.  -01/28/2010: PCI completed -01/31/2013: CT chest: New onset of tiny subcentimeter clusters of nodules in the right lung. These may be inflammatory and/ or granulomatous. Metastatic disease cannot be excluded.  -07/28/2013: CT chest: Several nodular densities in the right lung, most of which are stable. There Claudia [name] a slowly enlarging and spiculated appearing nodule in the anterior inferior right upper lobe, measuring 11 x 8 mm on today's study compared to 10 x 6 mm previously.  -08/30/2013: PET: There Claudia [name] malignant range FDG uptake associated with the right upper lobe nodule. This Claudia [name] worrisome for either synchronous right upper lobe primary or metastatic disease. The other smaller nodules in the right lung are too small to characterize. There Claudia [name] a nodular focus of malignant range FDG uptake along the medial margin of radiation change in the left lung. This Claudia [name] a nonspecific focus and may reflect inflammation. Residual or recurrent local tumor Claudia [name] not excluded.  -09/21/2013: Wedge resection of RUL Pathology: Poorly differentiated carcinoma. Tumor  cells are positive for CD56, synaptophysin, cytokeratin AE1/3, and p63  on staining. Poorly differentiated carcinoma with neuroendocrine differentiation  -10/30/2013-01/08/2014: 4 cycles of adjuvant carboplatin  and etoposide   -01/22/2014-04/06/2022: Patient under observation, NED seen on imaging -04/06/2022: CT Chest: Areas amidst post treatment change in collapsed lung in the LEFT upper lobe that raise the question of disease recurrence and or early metastasis. Scattered small nodules throughout the LEFT chest have increased and or are new since previous imaging. Based on basilar distribution of some of these findings would also consider the possibility of aspiration related changes -05/28/2022: Bronchoscopy with bronchial biopsies and FNA.   Cytology: Negative for malignancy.  -08/03/2022: CT chest: Post radiation scarring in the medial left upper lobe with abnormal hypermetabolism seen in the anterior left upper lobe on 04/23/2022, compatible with residual/recurrent disease. Scattered new nodular densities in the lungs bilaterally, largest in the left lower lobe, compatible with progressive metastatic disease. New subcentimeter hypodense lesion in the left hepatic lobe with bulky periportal adenopathy, indicative of new metastatic disease.  -As the new recurrence Claudia [name] in the same area as previous recurrence, did not recommend rebiopsy to avoid complications (per documentation) -08/12/2022: Port inserted -08/13/2022: PET: Radiation changes in the left upper lobe with focal hypermetabolism suggestive of residual/recurrent tumor, although improved from prior PET. Dominant 8 mm subpleural nodule in the left lower lobe, unchanged from recent CT and new from prior PET. Metastatic disease not excluded, although infection/inflammation Claudia [name] also possible. Additional prior hypermetabolic nodules on PET have resolved. No evidence of metastatic disease in the abdomen/pelvis. -08/14/2022:  MRI Brain: No metastatic  disease or acute intracranial abnormality.  -08/17/2022-11/02/2022: 4 cycles of carboplatin , etoposide , and durvalumab  -03/09/2023: CT CAP: Worsened left upper lobe/apical aeration since 10/21/2022. Increased ground-glass and septal thickening in the apex with progressive posterior left upper lobe collapse/consolidation. Considerations include progression of radiation change and/or superimposed infection. Not a typical appearance of local recurrence, which cannot be entirely excluded. Diffuse pulmonary nodularity, including new bilateral lower lobe pulmonary nodules as detailed above. No acute process or evidence of metastatic disease in the abdomen or pelvis. -11/23/2022-current: Maintenance durvalumab   -05/03/2023-12/15/2023: Relatively stable imaging   Current Treatment:  Maintenance durvalumab    INTERVAL HISTORY:   Claudia Claudia [name] Claudia [name] here today for follow up and to establish care with me for small cell lung cancer. Patient Claudia [name] accompanied by her husband today .   She has a persistent rash on her hand that has not resolved with previous treatments. The exact duration Claudia [name] unclear, but it worsens after bathing. She has been using a cream, possibly a steroid, but cannot recall the exact name. The rash Claudia [name] localized to her hand and does not appear elsewhere on her body.  She has a history of lung cancer and Claudia [name] currently undergoing immunotherapy with Durvalumab . A recent CT scan was performed, and her hemoglobin level Claudia [name] 10.1, which Claudia [name] higher than previous levels. No difficulty breathing Claudia [name] reported.  She has a history of gastroparesis, confirmed by a gastric emptying study where her stomach emptied only 20% after four hours. This condition causes her to feel full quickly, impacting her ability to eat full meals. She often manages only small portions such as half a hamburger or a piece of chicken. No diarrhea or vomiting, but she reports some 'spit up' related to gastroparesis.  She Claudia [name] not currently  taking vitamin B12 supplements, but her levels are borderline low.  I have reviewed the past medical history, past surgical history, social history and family history with the patient and they are unchanged from previous note.  ALLERGIES:  Claudia [name] allergic to azithromycin, tussionex pennkinetic er [hydrocod poli-chlorphe poli er], and omeprazole .  MEDICATIONS:  Current Outpatient Medications  Medication Sig Dispense Refill   Calcium  Carbonate Antacid (TUMS PO) Take 500-1,000 mg by mouth 3 (three) times daily as needed (indigestion/heartburn.).     magnesium  oxide (MAG-OX) 400 (240 Mg) MG tablet TAKE 1 TABLET BY MOUTH DAILY 60 tablet 2   metoCLOPramide  (REGLAN ) 5 MG tablet Take 1 tablet (5 mg total) by mouth 3 (three) times daily before meals. (Patient taking differently: Take 5 mg by mouth 3 (three) times daily before meals. Only once per day.) 180 tablet 3   tretinoin (RETIN-A) 0.1 % cream Apply 1 application  topically at bedtime.     No current facility-administered medications for this visit.    REVIEW OF SYSTEMS:   Constitutional: Denies fevers, chills or abnormal weight loss Eyes: Denies blurriness of vision Ears, nose, mouth, throat, and face: Denies mucositis or sore throat Respiratory: Denies cough, dyspnea or wheezes Cardiovascular: Denies palpitation, chest discomfort or lower extremity swelling Gastrointestinal:  Denies nausea, heartburn or change in bowel habits Skin: Denies abnormal skin rashes Lymphatics: Denies new lymphadenopathy or easy bruising Neurological:Denies numbness, tingling or new weaknesses Behavioral/Psych: Mood Claudia [name] stable, no new changes  All other systems were reviewed with the patient and are negative.   VITALS:  There were no vitals taken for this visit.  Wt Readings from Last 3 Encounters:  11/25/23 84 lb 9.6 oz (38.4 kg)  11/22/23  76 lb 11.5 oz (34.8 kg)  10/25/23 82 lb 14.3 oz (37.6 kg)    There Claudia [name] no height or weight on file to calculate  BMI.  Performance status (ECOG): 2 - Symptomatic, <50% confined to bed  PHYSICAL EXAM:   GENERAL:alert, no distress and comfortable, very thin frail female SKIN: Erythematous rash on her right hand, significantly more than left hand.  Pictured below LYMPH:  no palpable lymphadenopathy in the cervical, axillary or inguinal LUNGS: clear to auscultation and percussion with normal breathing effort HEART: regular rate & rhythm and no murmurs and no lower extremity edema ABDOMEN:abdomen soft, non-tender and normal bowel sounds Musculoskeletal:no cyanosis of digits and no clubbing  NEURO: alert & oriented x 3 with fluent speech, no focal motor/sensory deficits   LABORATORY DATA:  I have reviewed the data as listed   Lab Results  Component Value Date   WBC 4.8 11/22/2023   NEUTROABS 3.5 11/22/2023   HGB 9.7 (L) 11/22/2023   HCT 29.5 (L) 11/22/2023   MCV 100.7 (H) 11/22/2023   PLT 104 (L) 11/22/2023      Chemistry      Component Value Date/Time   NA 139 12/20/2023 1200   NA 141 11/02/2016 0919   K 3.9 12/20/2023 1200   K 4.2 11/02/2016 0919   CL 102 12/20/2023 1200   CL 103 08/03/2012 0842   CO2 26 12/20/2023 1200   CO2 29 11/02/2016 0919   BUN 20 12/20/2023 1200   BUN 13.9 11/02/2016 0919   CREATININE 0.77 12/20/2023 1200   CREATININE 0.75 05/25/2022 1417   CREATININE 1.0 11/02/2016 0919      Component Value Date/Time   CALCIUM  9.6 12/20/2023 1200   CALCIUM  10.9 (H) 11/02/2016 0919   ALKPHOS 61 12/20/2023 1200   ALKPHOS 78 11/02/2016 0919   AST 14 (L) 12/20/2023 1200   AST 13 (L) 05/25/2022 1417   AST 19 11/02/2016 0919   ALT 7 12/20/2023 1200   ALT 6 05/25/2022 1417   ALT 12 11/02/2016 0919   BILITOT 0.8 12/20/2023 1200   BILITOT 0.5 05/25/2022 1417   BILITOT 0.47 11/02/2016 0919       Latest Reference Range & Units 09/27/23 11:49 09/27/23 11:50  Iron 28 - 170 ug/dL  18 (L)  UIBC ug/dL  850  TIBC 749 - 549 ug/dL  832 (L)  Saturation Ratios 10.4 - 31.8 %  11   Ferritin 11 - 307 ng/mL  685 (H)  Folate >5.9 ng/mL  13.5  Copper  80 - 158 ug/dL 897   Vitamin B12 819 - 914 pg/mL  449  (L): Data Claudia [name] abnormally low (H): Data Claudia [name] abnormally high   Latest Reference Range & Units 12/20/23 12:00  TSH 0.350 - 4.500 uIU/mL 1.745    RADIOGRAPHIC STUDIES: I have personally reviewed the radiological images as listed and agreed with the findings in the report.  CT CHEST W CONTRAST CLINICAL DATA:  Small cell lung cancer (SCLC), monitor. * Tracking Code: BO *  EXAM: CT CHEST WITH CONTRAST  TECHNIQUE: Multidetector CT imaging of the chest was performed during intravenous contrast administration.  RADIATION DOSE REDUCTION: This exam was performed according to the departmental dose-optimization program which includes automated exposure control, adjustment of the mA and/or kV according to patient size and/or use of iterative reconstruction technique.  CONTRAST:  80mL OMNIPAQUE  IOHEXOL  300 MG/ML  SOLN  COMPARISON:  CT scan chest from 08/20/2023.  FINDINGS: Cardiovascular: Normal cardiac size. No pericardial effusion. No aortic aneurysm. There  are mild peripheral atherosclerotic vascular calcifications of thoracic aorta and its major branches.  Mediastinum/Nodes: Visualized thyroid  gland appears grossly unremarkable. No solid / cystic mediastinal masses. There Claudia [name] shift of mediastinal structures to the left, similar to the prior study. There Claudia [name] dilated esophagus containing frothy material, which Claudia [name] nonspecific but most likely seen in the settings of chronic gastroesophageal reflux disease versus esophageal dysmotility. However, correlate clinically to determine the need for further evaluation with endoscopy. No axillary, mediastinal or hilar lymphadenopathy by size criteria.  Lungs/Pleura: The central tracheo-bronchial tree Claudia [name] patent. Mild-to-moderate right upper lobe predominant centrilobular emphysema noted. Redemonstration of chronic volume loss  with associated multi segmental collapse predominantly in the left upper hemithorax. There are new heterogeneous opacities in the superior portion of left lung (series 2, images 28 through 38), which may represent pneumonia. Stable appearance of bronchiectatic changes in the left lung. There are multiple scattered sub 4 mm nodules in the left lung, grossly similar to the prior study.  There are multiple tree-in-bud configuration nodules in the middle lobe and right lower lobe, which are also similar to the prior study. However, there Claudia [name] new opacity in the middle lobe (series 3, image 101), which may represent focal pneumonitis. Correlate clinically. No pleural effusion or pneumothorax on either side. Redemonstration of hyperdense staple line in the right upper lobe, medially from prior wedge resection.  Upper Abdomen: There Claudia [name] a stable partially exophytic cyst arising from the left kidney upper pole measuring approximately 2.6 x 2.7 cm. Remaining visualized upper abdominal viscera within normal limits.  Musculoskeletal: The visualized soft tissues of the chest wall are grossly unremarkable. No suspicious osseous lesions. Pick 1  IMPRESSION: 1. Redemonstration of chronic volume loss with associated multi segmental collapse predominantly in the left upper hemithorax. There are new heterogeneous opacities in the superior portion of left lung, which may represent pneumonia. There are multiple scattered sub 4 mm nodules in the left lung, grossly similar to the prior study. 2. There are multiple tree-in-bud configuration nodules in the middle lobe and right lower lobe, which are also similar to the prior study. However, there Claudia [name] new opacity in the middle lobe, which may represent focal pneumonitis. 3. Multiple other nonacute observations, as described above.  Electronically Signed   By: Ree Molt M.D.   On: 12/17/2023 18:01

## 2023-12-20 NOTE — Progress Notes (Signed)
 Patient presents today for Imfinzi  infusion. Patient is in satisfactory condition with no new complaints voiced.  Vital signs are stable.  Labs reviewed by Dr. Davonna during the office visit and all labs are within treatment parameters.  We will proceed with treatment per MD orders.

## 2023-12-20 NOTE — Patient Instructions (Signed)
 [facility] [place] - A DEPT OF [facility]  Discharge Instructions: Thank you for choosing [facility] to provide your oncology and hematology care.  If you have a lab appointment with the Cancer Center - please note that after April 8th, 2024, all labs will be drawn in the cancer center.  You do not have to check in or register with the main entrance as you have in the past but will complete your check-in in the cancer center.  Wear comfortable clothing and clothing appropriate for easy access to any Portacath or PICC line.   We strive to give you quality time with your provider. You may need to reschedule your appointment if you arrive late (15 or more minutes).  Arriving late affects you and other patients whose appointments are after yours.  Also, if you miss three or more appointments without notifying the office, you may be dismissed from the clinic at the provider's discretion.      For prescription refill requests, have your pharmacy contact our office and allow 72 hours for refills to be completed.    Today you received the following chemotherapy and/or immunotherapy agents imfiniz   To help prevent nausea and vomiting after your treatment, we encourage you to take your nausea medication as directed.  BELOW ARE SYMPTOMS THAT SHOULD BE REPORTED IMMEDIATELY: *FEVER GREATER THAN 100.4 F (38 C) OR HIGHER *CHILLS OR SWEATING *NAUSEA AND VOMITING THAT IS NOT CONTROLLED WITH YOUR NAUSEA MEDICATION *UNUSUAL SHORTNESS OF BREATH *UNUSUAL BRUISING OR BLEEDING *URINARY PROBLEMS (pain or burning when urinating, or frequent urination) *BOWEL PROBLEMS (unusual diarrhea, constipation, pain near the anus) TENDERNESS IN MOUTH AND THROAT WITH OR WITHOUT PRESENCE OF ULCERS (sore throat, sores in mouth, or a toothache) UNUSUAL RASH, SWELLING OR PAIN  UNUSUAL VAGINAL DISCHARGE OR ITCHING   Items with * indicate a potential emergency and should be followed up as  soon as possible or go to the Emergency Department if any problems should occur.  Please show the CHEMOTHERAPY ALERT CARD or IMMUNOTHERAPY ALERT CARD at check-in to the Emergency Department and triage nurse.  Should you have questions after your visit or need to cancel or reschedule your appointment, please contact [facility] [place] - A DEPT OF [facility] [phone]  and follow the prompts.  Office hours are 8:00 a.m. to 4:30 p.m. Monday - Friday. Please note that voicemails left after 4:00 p.m. may not be returned until the following business day.  We are closed weekends and major holidays. You have access to a nurse at all times for urgent questions. Please call the main number to the clinic [phone] and follow the prompts.  For any non-urgent questions, you may also contact your provider using MyChart. We now offer e-Visits for anyone 38 and older to request care online for non-urgent symptoms. For details visit [email or web address].   Also download the MyChart app! Go to the app store, search "MyChart", open the app, select [facility], and log in with your MyChart username and password.

## 2023-12-20 NOTE — Progress Notes (Signed)
 Patient has been examined by Dr. Davonna. Vital signs and labs have been reviewed by MD - ANC, Creatinine, LFTs, hemoglobin, and platelets have been reviewed by M.D. - pt may proceed with treatment.  Primary RN and pharmacy notified.

## 2023-12-29 ENCOUNTER — Encounter: Payer: Self-pay | Admitting: Oncology

## 2024-01-03 ENCOUNTER — Ambulatory Visit (HOSPITAL_COMMUNITY)
Admission: RE | Admit: 2024-01-03 | Discharge: 2024-01-03 | Disposition: A | Discharge status: Home / Self Care | Source: Ambulatory Visit | Attending: Hematology | Admitting: Hematology

## 2024-01-03 DIAGNOSIS — C3411 Malignant neoplasm of upper lobe, right bronchus or lung: Secondary | ICD-10-CM | POA: Diagnosis not present

## 2024-01-03 DIAGNOSIS — C3490 Malignant neoplasm of unspecified part of unspecified bronchus or lung: Secondary | ICD-10-CM | POA: Diagnosis not present

## 2024-01-03 DIAGNOSIS — G9600 Cerebrospinal fluid leak, unspecified: Secondary | ICD-10-CM | POA: Diagnosis not present

## 2024-01-03 MED ORDER — GADOBUTROL 1 MMOL/ML IV SOLN
4.0000 mL | Freq: Once | INTRAVENOUS | Status: AC | PRN
Start: 2024-01-03 — End: 2024-01-03
  Administered 2024-01-03: 4 mL via INTRAVENOUS

## 2024-01-12 ENCOUNTER — Encounter (INDEPENDENT_AMBULATORY_CARE_PROVIDER_SITE_OTHER): Payer: Self-pay | Admitting: Gastroenterology

## 2024-01-17 ENCOUNTER — Inpatient Hospital Stay

## 2024-01-17 ENCOUNTER — Inpatient Hospital Stay: Admitting: Oncology

## 2024-01-17 ENCOUNTER — Encounter: Payer: Self-pay | Admitting: Oncology

## 2024-01-17 ENCOUNTER — Inpatient Hospital Stay: Attending: Nurse Practitioner

## 2024-01-17 VITALS — BP 135/66 | HR 79 | Temp 97.7°F | Resp 18

## 2024-01-17 DIAGNOSIS — C3411 Malignant neoplasm of upper lobe, right bronchus or lung: Secondary | ICD-10-CM

## 2024-01-17 DIAGNOSIS — D6481 Anemia due to antineoplastic chemotherapy: Secondary | ICD-10-CM | POA: Diagnosis not present

## 2024-01-17 DIAGNOSIS — R21 Rash and other nonspecific skin eruption: Secondary | ICD-10-CM

## 2024-01-17 DIAGNOSIS — T451X5A Adverse effect of antineoplastic and immunosuppressive drugs, initial encounter: Secondary | ICD-10-CM | POA: Diagnosis not present

## 2024-01-17 DIAGNOSIS — Z79620 Long term (current) use of immunosuppressive biologic: Secondary | ICD-10-CM | POA: Diagnosis not present

## 2024-01-17 DIAGNOSIS — Z5112 Encounter for antineoplastic immunotherapy: Secondary | ICD-10-CM | POA: Diagnosis present

## 2024-01-17 DIAGNOSIS — C3481 Malignant neoplasm of overlapping sites of right bronchus and lung: Secondary | ICD-10-CM | POA: Diagnosis present

## 2024-01-17 LAB — COMPREHENSIVE METABOLIC PANEL WITH GFR
ALT: <5 U/L (ref 0–44)
AST: 14 U/L — ABNORMAL LOW (ref 15–41)
Albumin: 3.8 g/dL (ref 3.5–5.0)
Alkaline Phosphatase: 83 U/L (ref 38–126)
Anion gap: 7 (ref 5–15)
BUN: 9 mg/dL (ref 8–23)
CO2: 30 mmol/L (ref 22–32)
Calcium: 9.8 mg/dL (ref 8.9–10.3)
Chloride: 104 mmol/L (ref 98–111)
Creatinine, Ser: 0.72 mg/dL (ref 0.44–1.00)
GFR, Estimated: >60 mL/min (ref >60)
Glucose, Bld: 92 mg/dL (ref 70–99)
Potassium: 4 mmol/L (ref 3.5–5.1)
Sodium: 140 mmol/L (ref 135–145)
Total Bilirubin: 0.5 mg/dL (ref 0.0–1.2)
Total Protein: 6.5 g/dL (ref 6.5–8.1)

## 2024-01-17 LAB — CBC WITH DIFFERENTIAL/PLATELET
Abs Immature Granulocytes: 0.05 K/uL (ref 0.00–0.07)
Basophils Absolute: 0.1 K/uL (ref 0.0–0.1)
Basophils Relative: 1 %
Eosinophils Absolute: 0 K/uL (ref 0.0–0.5)
Eosinophils Relative: 1 %
HCT: 31.1 % — ABNORMAL LOW (ref 36.0–46.0)
Hemoglobin: 10.1 g/dL — ABNORMAL LOW (ref 12.0–15.0)
Immature Granulocytes: 1 %
Lymphocytes Relative: 8 %
Lymphs Abs: 0.5 K/uL — ABNORMAL LOW (ref 0.7–4.0)
MCH: 32.4 pg (ref 26.0–34.0)
MCHC: 32.5 g/dL (ref 30.0–36.0)
MCV: 99.7 fL (ref 80.0–100.0)
Monocytes Absolute: 1 K/uL (ref 0.1–1.0)
Monocytes Relative: 18 %
Neutro Abs: 4 K/uL (ref 1.7–7.7)
Neutrophils Relative %: 71 %
Platelets: 135 K/uL — ABNORMAL LOW (ref 150–400)
RBC: 3.12 MIL/uL — ABNORMAL LOW (ref 3.87–5.11)
RDW: 14.4 % (ref 11.5–15.5)
WBC: 5.6 K/uL (ref 4.0–10.5)
nRBC: 0 % (ref 0.0–0.2)

## 2024-01-17 LAB — MAGNESIUM: Magnesium: 2 mg/dL (ref 1.7–2.4)

## 2024-01-17 LAB — TSH: TSH: 1.31 u[IU]/mL (ref 0.350–4.500)

## 2024-01-17 MED ORDER — SODIUM CHLORIDE 0.9 % IV SOLN
1500.0000 mg | Freq: Once | INTRAVENOUS | Status: AC
  Administered 2024-01-17: 1500 mg via INTRAVENOUS
  Filled 2024-01-17: qty 30

## 2024-01-17 MED ORDER — SODIUM CHLORIDE 0.9 % IV SOLN: Freq: Once | INTRAVENOUS | Status: AC

## 2024-01-17 NOTE — Patient Instructions (Signed)
 [facility] at [facility] Discharge Instructions   You were seen and examined today by Dr. Davonna.  She reviewed the results of your lab work which are normal/stable.   She reviewed the results of your MRI of the brain which did not show any evidence of cancer.   We will proceed with your treatment today.   Return as scheduled.    Thank you for choosing [facility] at [facility] to provide your oncology and hematology care.  To afford each patient quality time with our provider, please arrive at least 15 minutes before your scheduled appointment time.   If you have a lab appointment with the Cancer Center please come in thru the Main Entrance and check in at the main information desk.  You need to re-schedule your appointment should you arrive 10 or more minutes late.  We strive to give you quality time with our providers, and arriving late affects you and other patients whose appointments are after yours.  Also, if you no show three or more times for appointments you may be dismissed from the clinic at the providers discretion.     Again, thank you for choosing [facility].  Our hope is that these requests will decrease the amount of time that you wait before being seen by our physicians.       _____________________________________________________________  Should you have questions after your visit to [facility], please contact our office at [phone] and follow the prompts.  Our office hours are 8:00 a.m. and 4:30 p.m. Monday - Friday.  Please note that voicemails left after 4:00 p.m. may not be returned until the following business day.  We are closed weekends and major holidays.  You do have access to a nurse 24-7, just call the main number to the clinic [phone] and do not press any options, hold on the line and a nurse will answer the phone.    For prescription refill requests, have your pharmacy contact  our office and allow 72 hours.    Due to Covid, you will need to wear a mask upon entering the hospital. If you do not have a mask, a mask will be given to you at the Main Entrance upon arrival. For doctor visits, patients may have 1 support person age 67 or older with them. For treatment visits, patients can not have anyone with them due to social distancing guidelines and our immunocompromised population.

## 2024-01-17 NOTE — Progress Notes (Signed)
 Patient presents today for chemotherapy Imfinzi  infusion. Patient is in satisfactory condition with no new complaints voiced.  Vital signs are stable.  Labs reviewed by Dr. Davonna during the office visit and all labs are within treatment parameters.  We will proceed with treatment per MD orders.   Treatment given today per MD orders. Tolerated infusion without adverse affects. Vital signs stable. No complaints at this time. Discharged from clinic ambulatory in stable condition. Alert and oriented x 3. F/U with [facility] as scheduled.

## 2024-01-17 NOTE — Patient Instructions (Signed)
 [facility] [place] - A DEPT OF [facility]  Discharge Instructions: Thank you for choosing [facility] to provide your oncology and hematology care.  If you have a lab appointment with the Cancer Center - please note that after April 8th, 2024, all labs will be drawn in the cancer center.  You do not have to check in or register with the main entrance as you have in the past but will complete your check-in in the cancer center.  Wear comfortable clothing and clothing appropriate for easy access to any Portacath or PICC line.   We strive to give you quality time with your provider. You may need to reschedule your appointment if you arrive late (15 or more minutes).  Arriving late affects you and other patients whose appointments are after yours.  Also, if you miss three or more appointments without notifying the office, you may be dismissed from the clinic at the provider's discretion.      For prescription refill requests, have your pharmacy contact our office and allow 72 hours for refills to be completed.    Today you received the following chemotherapy and/or immunotherapy agents Imfinzi   To help prevent nausea and vomiting after your treatment, we encourage you to take your nausea medication as directed.  Durvalumab Injection What is this medication? DURVALUMAB (dur VAL ue mab) treats some types of cancer. It works by helping your immune system slow or stop the spread of cancer cells. It is a monoclonal antibody. This medicine may be used for other purposes; ask your health care provider or pharmacist if you have questions. COMMON BRAND NAME(S): IMFINZI What should I tell my care team before I take this medication? They need to know if you have any of these conditions: Allogeneic stem cell transplant (uses someone else's stem cells) Autoimmune diseases, such as Crohn disease, ulcerative colitis, lupus History of chest radiation Nervous system  problems, such as Guillain-Barre syndrome, myasthenia gravis Organ transplant An unusual or allergic reaction to durvalumab, other medications, foods, dyes, or preservatives Pregnant or trying to get pregnant Breast-feeding How should I use this medication? This medication is infused into a vein. It is given by your care team in a hospital or clinic setting. A special MedGuide will be given to you before each treatment. Be sure to read this information carefully each time. Talk to your care team about the use of this medication in children. Special care may be needed. Overdosage: If you think you have taken too much of this medicine contact a poison control center or emergency room at once. NOTE: This medicine is only for you. Do not share this medicine with others. What if I miss a dose? Keep appointments for follow-up doses. It is important not to miss your dose. Call your care team if you are unable to keep an appointment. What may interact with this medication? Interactions have not been studied. This list may not describe all possible interactions. Give your health care provider a list of all the medicines, herbs, non-prescription drugs, or dietary supplements you use. Also tell them if you smoke, drink alcohol, or use illegal drugs. Some items may interact with your medicine. What should I watch for while using this medication? Your condition will be monitored carefully while you are receiving this medication. You may need blood work while taking this medication. This medication may cause serious skin reactions. They can happen weeks to months after starting the medication. Contact  your care team right away if you notice fevers or flu-like symptoms with a rash. The rash may be red or purple and then turn into blisters or peeling of the skin. You may also notice a red rash with swelling of the face, lips, or lymph nodes in your neck or under your arms. Tell your care team right away if you  have any change in your eyesight. Talk to your care team if you may be pregnant. Serious birth defects can occur if you take this medication during pregnancy and for 3 months after the last dose. You will need a negative pregnancy test before starting this medication. Contraception is recommended while taking this medication and for 3 months after the last dose. Your care team can help you find the option that works for you. Do not breastfeed while taking this medication and for 3 months after the last dose. What side effects may I notice from receiving this medication? Side effects that you should report to your care team as soon as possible: Allergic reactions--skin rash, itching, hives, swelling of the face, lips, tongue, or throat Dry cough, shortness of breath or trouble breathing Eye pain, redness, irritation, or discharge with blurry or decreased vision Heart muscle inflammation--unusual weakness or fatigue, shortness of breath, chest pain, fast or irregular heartbeat, dizziness, swelling of the ankles, feet, or hands Hormone gland problems--headache, sensitivity to light, unusual weakness or fatigue, dizziness, fast or irregular heartbeat, increased sensitivity to cold or heat, excessive sweating, constipation, hair loss, increased thirst or amount of urine, tremors or shaking, irritability Infusion reactions--chest pain, shortness of breath or trouble breathing, feeling faint or lightheaded Kidney injury (glomerulonephritis)--decrease in the amount of urine, red or dark brown urine, foamy or bubbly urine, swelling of the ankles, hands, or feet Liver injury--right upper belly pain, loss of appetite, nausea, light-colored stool, dark yellow or brown urine, yellowing skin or eyes, unusual weakness or fatigue Pain, tingling, or numbness in the hands or feet, muscle weakness, change in vision, confusion or trouble speaking, loss of balance or coordination, trouble walking, seizures Rash, fever, and  swollen lymph nodes Redness, blistering, peeling, or loosening of the skin, including inside the mouth Sudden or severe stomach pain, bloody diarrhea, fever, nausea, vomiting Side effects that usually do not require medical attention (report these to your care team if they continue or are bothersome): Bone, joint, or muscle pain Diarrhea Fatigue Loss of appetite Nausea Skin rash This list may not describe all possible side effects. Call your doctor for medical advice about side effects. You may report side effects to FDA at 1-800-FDA-1088. Where should I keep my medication? This medication is given in a hospital or clinic. It will not be stored at home. NOTE: This sheet is a summary. It may not cover all possible information. If you have questions about this medicine, talk to your doctor, pharmacist, or health care provider.  2024 Elsevier/Gold Standard (2021-07-29 00:00:00)   BELOW ARE SYMPTOMS THAT SHOULD BE REPORTED IMMEDIATELY: *FEVER GREATER THAN 100.4 F (38 C) OR HIGHER *CHILLS OR SWEATING *NAUSEA AND VOMITING THAT IS NOT CONTROLLED WITH YOUR NAUSEA MEDICATION *UNUSUAL SHORTNESS OF BREATH *UNUSUAL BRUISING OR BLEEDING *URINARY PROBLEMS (pain or burning when urinating, or frequent urination) *BOWEL PROBLEMS (unusual diarrhea, constipation, pain near the anus) TENDERNESS IN MOUTH AND THROAT WITH OR WITHOUT PRESENCE OF ULCERS (sore throat, sores in mouth, or a toothache) UNUSUAL RASH, SWELLING OR PAIN  UNUSUAL VAGINAL DISCHARGE OR ITCHING   Items with * indicate a  potential emergency and should be followed up as soon as possible or go to the Emergency Department if any problems should occur.  Please show the CHEMOTHERAPY ALERT CARD or IMMUNOTHERAPY ALERT CARD at check-in to the Emergency Department and triage nurse.  Should you have questions after your visit or need to cancel or reschedule your appointment, please contact [facility] [place] - A DEPT OF [facility] [phone]  and follow the prompts.  Office hours are 8:00 a.m. to 4:30 p.m. Monday - Friday. Please note that voicemails left after 4:00 p.m. may not be returned until the following business day.  We are closed weekends and major holidays. You have access to a nurse at all times for urgent questions. Please call the main number to the clinic [phone] and follow the prompts.  For any non-urgent questions, you may also contact your provider using MyChart. We now offer e-Visits for anyone 52 and older to request care online for non-urgent symptoms. For details visit [email or web address].   Also download the MyChart app! Go to the app store, search "MyChart", open the app, select [place], and log in with your MyChart username and password.

## 2024-01-17 NOTE — Progress Notes (Signed)
 Patient has been examined by Dr. Davonna. Vital signs and labs have been reviewed by MD - ANC, Creatinine, LFTs, hemoglobin, and platelets have been reviewed by M.D. - pt may proceed with treatment.  Primary RN and pharmacy notified.

## 2024-01-17 NOTE — Progress Notes (Signed)
 Patient Care Team: [name], MD as PCP - General (Internal Medicine) [name], MD as PCP - Cardiology (Cardiology) [name], MD as Consulting Physician (Gastroenterology) [name], RN as Oncology Nurse Navigator (Medical Oncology)  Clinic Day:  01/17/2024  Referring physician: [name], MD   CHIEF COMPLAINT:  CC: Recurrent extensive stage small cell lung cancer   ASSESSMENT & PLAN:   Assessment & Plan: QUETZALLI CLOS  is a 78 y.o. female with recurrent extensive stage small cell lung cancer   Small cell carcinoma of overlapping sites of right lung (HCC) Recurrent extensive stage small cell lung cancer.  Extensive oncology history below. Completed induction chemotherapy with carboplatin , etoposide  and durvalumab  recently Has been on maintenance durvalumab  for the past 1 year. Recent CT scan stable with some findings of may be pneumonitis.  Patient is asymptomatic.  - Continue durvalumab  1500mg  every 4 weeks.  Patient reports no immunotherapy related side effects. - Labs reviewed today: CMP: Normal creatinine, normal liver CBC: Hemoglobin: 10.1, platelets: 120, WBC: 5, TSH: 1.745 -Physical exam stable today.  Proceed with immunotherapy today. -Will repeat CT scan in 3 months. I.e 02/2024 - We reviewed the recent MRI findings together.  There is no evidence of intracranial disease.  Will repeat MRI brain yearly. Due 12/2025  Return to clinic in 2 months with CT CAP for follow up.  Anemia due to antineoplastic chemotherapy  Normocytic anemia likely secondary to lung carcinoma. Iron panel within normal limits  but with elevated ferritin. Borderline low vitamin B12 levels   - Continue over-the-counter vitamin B12 1000 mg daily. - Will continue to monitor  Rash  Patient has a edematous rash on her right hand.  Likely secondary to immunotherapy.  Significantly improved today.   -Continue triamcinolone  0.1% to be applied twice daily - Recommended  patient to watch out for worsening of the rash   The patient understands the plans discussed today and is in agreement with them.  She knows to contact our office if she develops concerns prior to her next appointment.  The total time spent in the appointment was 20 minutes for the encounter  with patient, including review of chart and various tests results, discussions about plan of care and coordination of care plan   I, Marijo Sharps, acting as a scribe for Mickiel Dry, MD.,have documented all relevant documentation on the behalf of Mickiel Dry, MD,as directed by  Mickiel Dry, MD while in the presence of Mickiel Dry, MD.  I, Mickiel Dry MD, have reviewed the above documentation for accuracy and completeness, and I agree with the above.    Mickiel Dry, MD  [facility] [place] - A DEPT OF [facility] [address] [place] [address] Dept: [phone] Dept Fax: [phone]   Orders Placed This Encounter  Procedures   CT CHEST ABDOMEN PELVIS W CONTRAST    Standing Status:   Future    Expected Date:   03/13/2024    Expiration Date:   01/16/2025    If indicated for the ordered procedure, I authorize the administration of contrast media per Radiology protocol:   Yes    Does the patient have a contrast media/X-ray dye allergy?:   No    Preferred imaging location?:   [facility]    If indicated for the ordered procedure, I authorize the administration of oral contrast media per Radiology protocol:   Yes  ONCOLOGY HISTORY:   I have reviewed her chart and materials related to her cancer extensively and collaborated history with the patient. Summary of oncologic history is as follows:   Diagnosis: Recurrent extensive stage small cell lung cancer  -Initial Presentation: Smoking history with unintentional weight loss and decreased appetite -08/15/2009: Chest X-ray: Left hilar/perihilar mass  suspicious for primary lung carcinoma, 2.9 x 3.4 cm in size. -08/15/2009: CT chest and abdomen: Left perihilar upper lobe mass, up to 3.6 cm. The mass is difficult to measure due to its branching and oblique orientation within the left upper lobe as well as its proximity to the enlarged left hilar lymph nodes. Findings most compatible with primary lung carcinoma. Tiny low density lesion posteriorly in the liver, too small to characterize.  -08/30/2009: LUL Lung biopsy and FNA.  Pathology: Small cell undifferentiated carcinoma. Positive malignant cells consistent with small cell carcinoma on FNA.  -09/04/2009: CT Head: No evidence of metastatic disease.  -09/04/2009: Initial PET: The central left upper lobe mass with left hilar lymphadenopathy shows neoplastic range FDG accumulation.  No evidence for hypermetabolism in the subcarinal station or contralateral mediastinum/hilum.  No distant hypermetabolic disease to suggest distant metastases.  -11/14/2009: Concurrent chemoradiation with 4 cycles of carboplatin  and etoposide  completed -11/29/2009: CT chest: The previously described left hilar mass and adjacent lymph node are no longer seen.  Several sub centimeter pulmonary nodules are noted without significant change compared to prior exam.  -11/2009-01/31/2013: Patient under surveillance, NED seen on imaging -12/30/2009: MRI brain: No evidence of intracranial metastatic disease.  -01/28/2010: PCI completed -01/31/2013: CT chest: New onset of tiny subcentimeter clusters of nodules in the right lung. These may be inflammatory and/ or granulomatous. Metastatic disease cannot be excluded.  -07/28/2013: CT chest: Several nodular densities in the right lung, most of which are stable. There is a slowly enlarging and spiculated appearing nodule in the anterior inferior right upper lobe, measuring 11 x 8 mm on today's study compared to 10 x 6 mm previously.  -08/30/2013: PET: There is malignant range FDG uptake  associated with the right upper lobe nodule. This is worrisome for either synchronous right upper lobe primary or metastatic disease. The other smaller nodules in the right lung are too small to characterize. There is a nodular focus of malignant range FDG uptake along the medial margin of radiation change in the left lung. This is a nonspecific focus and may reflect inflammation. Residual or recurrent local tumor is not excluded.  -09/21/2013: Wedge resection of RUL Pathology: Poorly differentiated carcinoma. Tumor cells are positive for CD56, synaptophysin, cytokeratin AE1/3, and p63  on staining. Poorly differentiated carcinoma with neuroendocrine differentiation  -10/30/2013-01/08/2014: 4 cycles of adjuvant carboplatin  and etoposide   -01/22/2014-04/06/2022: Patient under observation, NED seen on imaging -04/06/2022: CT Chest: Areas amidst post treatment change in collapsed lung in the LEFT upper lobe that raise the question of disease recurrence and or early metastasis. Scattered small nodules throughout the LEFT chest have increased and or are new since previous imaging. Based on basilar distribution of some of these findings would also consider the possibility of aspiration related changes -05/28/2022: Bronchoscopy with bronchial biopsies and FNA.   Cytology: Negative for malignancy.  -08/03/2022: CT chest: Post radiation scarring in the medial left upper lobe with abnormal hypermetabolism seen in the anterior left upper lobe on 04/23/2022, compatible with residual/recurrent disease. Scattered new nodular densities in the lungs bilaterally, largest in the left lower lobe, compatible with progressive metastatic disease. New subcentimeter hypodense lesion in  the left hepatic lobe with bulky periportal adenopathy, indicative of new metastatic disease.  -As the new recurrence is in the same area as previous recurrence, did not recommend rebiopsy to avoid complications (per documentation) -08/12/2022:  Port inserted -08/13/2022: PET: Radiation changes in the left upper lobe with focal hypermetabolism suggestive of residual/recurrent tumor, although improved from prior PET. Dominant 8 mm subpleural nodule in the left lower lobe, unchanged from recent CT and new from prior PET. Metastatic disease not excluded, although infection/inflammation is also possible. Additional prior hypermetabolic nodules on PET have resolved. No evidence of metastatic disease in the abdomen/pelvis. -08/14/2022: MRI Brain: No metastatic disease or acute intracranial abnormality.  -08/17/2022-11/02/2022: 4 cycles of carboplatin , etoposide , and durvalumab  -03/09/2023: CT CAP: Worsened left upper lobe/apical aeration since 10/21/2022. Increased ground-glass and septal thickening in the apex with progressive posterior left upper lobe collapse/consolidation. Considerations include progression of radiation change and/or superimposed infection. Not a typical appearance of local recurrence, which cannot be entirely excluded. Diffuse pulmonary nodularity, including new bilateral lower lobe pulmonary nodules as detailed above. No acute process or evidence of metastatic disease in the abdomen or pelvis. -11/23/2022-current: Maintenance durvalumab   -03/09/2023: CT CAP with contrast: Increased ground-glass and septal thickening in the apex with progressive posterior left upper lobe collapse/consolidation. Considerations include progression of radiation change and/or superimposed infection. Not a typical appearance of local recurrence, which cannot be entirely excluded. Diffuse pulmonary nodularity, including new bilateral lower lobe pulmonary nodules. Given the appearance of the esophagus, this could be related to chronic aspiration. Metastasis cannot be excluded. No acute process or evidence of metastatic disease in the abdomen or pelvis. Incidental findings, including:  -05/03/2023-12/15/2023: Relatively stable imaging -01/03/2024: MR Brain  with contrast : No brain metastases identified.    Current Treatment:  Maintenance durvalumab  1500 mg every 4 weeks  INTERVAL HISTORY:   Claudia [name] is here today for follow up with me for small cell lung cancer. Patient is accompanied by her husband today .   Giana reports feeling well overall. She has had a 2 pound intentional weight gain. Her husband notes that she does sleep a lot during the day. She is still taking vitamin B12 regularly.   Lanora notes that the steroid cream helped treating the rash on her hand. She denies any difficulty breathing or diarrhea.   I have reviewed the past medical history, past surgical history, social history and family history with the patient and they are unchanged from previous note.  ALLERGIES:  is allergic to azithromycin, tussionex pennkinetic er [hydrocod poli-chlorphe poli er], and omeprazole .  MEDICATIONS:  Current Outpatient Medications  Medication Sig Dispense Refill   Calcium  Carbonate Antacid (TUMS PO) Take 500-1,000 mg by mouth 3 (three) times daily as needed (indigestion/heartburn.).     clotrimazole-betamethasone (LOTRISONE) cream Apply topically 2 (two) times daily as needed.     magnesium  oxide (MAG-OX) 400 (240 Mg) MG tablet TAKE 1 TABLET BY MOUTH DAILY 60 tablet 2   metoCLOPramide  (REGLAN ) 5 MG tablet Take 1 tablet (5 mg total) by mouth 3 (three) times daily before meals. (Patient taking differently: Take 5 mg by mouth 3 (three) times daily before meals. Only once per day.) 180 tablet 3   tretinoin (RETIN-A) 0.1 % cream Apply 1 application  topically at bedtime.     triamcinolone  cream (KENALOG ) 0.1 % Apply 1 Application topically 2 (two) times daily. 60 g 2   No current facility-administered medications for this visit.   Facility-Administered Medications Ordered in Other Visits  Medication Dose Route Frequency Provider Last Rate Last Admin   durvalumab  (IMFINZI ) 1,500 mg in sodium chloride  0.9 % 100 mL chemo infusion   1,500 mg Intravenous Once Davonna Siad, MD        REVIEW OF SYSTEMS:   Constitutional: Denies fevers, chills or abnormal weight loss Eyes: Denies blurriness of vision Ears, nose, mouth, throat, and face: Denies mucositis or sore throat Respiratory: Denies cough, dyspnea or wheezes Cardiovascular: Denies palpitation, chest discomfort or lower extremity swelling Gastrointestinal:  Denies nausea, heartburn or change in bowel habits Skin: Denies abnormal skin rashes Lymphatics: Denies new lymphadenopathy or easy bruising Neurological:Denies numbness, tingling or new weaknesses Behavioral/Psych: Mood is stable, no new changes  All other systems were reviewed with the patient and are negative.   VITALS:  There were no vitals taken for this visit.  Wt Readings from Last 3 Encounters:  01/17/24 84 lb 9.6 oz (38.4 kg)  12/20/23 82 lb 10.8 oz (37.5 kg)  11/25/23 84 lb 9.6 oz (38.4 kg)    There is no height or weight on file to calculate BMI.  Performance status (ECOG): 2 - Symptomatic, <50% confined to bed  PHYSICAL EXAM:   GENERAL:alert, no distress and comfortable, very thin frail female SKIN: Erythematous rash on her right hand - improved LYMPH:  no palpable lymphadenopathy in the cervical, axillary or inguinal LUNGS: clear to auscultation and percussion with normal breathing effort HEART: regular rate & rhythm and no murmurs and no lower extremity edema ABDOMEN:abdomen soft, non-tender and normal bowel sounds Musculoskeletal:no cyanosis of digits and no clubbing  NEURO: alert & oriented x 3 with fluent speech, no focal motor/sensory deficits  LABORATORY DATA:  I have reviewed the data as listed   Lab Results  Component Value Date   WBC 5.6 01/17/2024   NEUTROABS 4.0 01/17/2024   HGB 10.1 (L) 01/17/2024   HCT 31.1 (L) 01/17/2024   MCV 99.7 01/17/2024   PLT 135 (L) 01/17/2024      Chemistry      Component Value Date/Time   NA 140 01/17/2024 0851   NA 141  11/02/2016 0919   K 4.0 01/17/2024 0851   K 4.2 11/02/2016 0919   CL 104 01/17/2024 0851   CL 103 08/03/2012 0842   CO2 30 01/17/2024 0851   CO2 29 11/02/2016 0919   BUN 9 01/17/2024 0851   BUN 13.9 11/02/2016 0919   CREATININE 0.72 01/17/2024 0851   CREATININE 0.75 05/25/2022 1417   CREATININE 1.0 11/02/2016 0919      Component Value Date/Time   CALCIUM  9.8 01/17/2024 0851   CALCIUM  10.9 (H) 11/02/2016 0919   ALKPHOS 83 01/17/2024 0851   ALKPHOS 78 11/02/2016 0919   AST 14 (L) 01/17/2024 0851   AST 13 (L) 05/25/2022 1417   AST 19 11/02/2016 0919   ALT <5 01/17/2024 0851   ALT 6 05/25/2022 1417   ALT 12 11/02/2016 0919   BILITOT 0.5 01/17/2024 0851   BILITOT 0.5 05/25/2022 1417   BILITOT 0.47 11/02/2016 0919       Latest Reference Range & Units 09/27/23 11:49 09/27/23 11:50  Iron 28 - 170 ug/dL  18 (L)  UIBC ug/dL  850  TIBC 749 - 549 ug/dL  832 (L)  Saturation Ratios 10.4 - 31.8 %  11  Ferritin 11 - 307 ng/mL  685 (H)  Folate >5.9 ng/mL  13.5  Copper  80 - 158 ug/dL 897   Vitamin B12 819 - 914 pg/mL  449  (L): Data  is abnormally low (H): Data is abnormally high   Latest Reference Range & Units 01/17/24 08:51  TSH 0.350 - 4.500 uIU/mL 1.310    RADIOGRAPHIC STUDIES: I have personally reviewed the radiological images as listed and agreed with the findings in the report.  MR Brain W Wo Contrast CLINICAL DATA:  Weakness, small-cell lung cancer  EXAM: MRI HEAD WITHOUT AND WITH CONTRAST  TECHNIQUE: Multiplanar, multiecho pulse sequences of the brain and surrounding structures were obtained without and with intravenous contrast.  CONTRAST:  4mL GADAVIST  GADOBUTROL  1 MMOL/ML IV SOLN  COMPARISON:  Aug 14, 2022  FINDINGS: MRI brain:  There is interval development of subdural hygromas.  There are innumerable foci of magnetic susceptibility in the brainstem, cerebellum and cerebral hemispheres.  There is mild dural enhancement. No parenchymal or  leptomeningeal enhancement.  There is no acute or chronic infarct.  The ventricles are normal.  No mass lesion.  There are normal flow signals in the carotid arteries and basilar artery.  No significant bone marrow signal abnormality.  No significant abnormality in the paranasal sinuses or soft tissues.  IMPRESSION: 1. No brain metastases identified 2. Interval development of bilateral subdural hygromas. 3. Extensive chronic microhemorrhages unchanged from the Aug 14, 2022 study.  Electronically Signed   By: Nancyann Burns M.D.   On: 01/04/2024 10:09

## 2024-01-18 ENCOUNTER — Other Ambulatory Visit: Payer: Self-pay

## 2024-02-04 DIAGNOSIS — R5383 Other fatigue: Secondary | ICD-10-CM | POA: Diagnosis not present

## 2024-02-04 DIAGNOSIS — Z0000 Encounter for general adult medical examination without abnormal findings: Secondary | ICD-10-CM | POA: Diagnosis not present

## 2024-02-04 DIAGNOSIS — E7800 Pure hypercholesterolemia, unspecified: Secondary | ICD-10-CM | POA: Diagnosis not present

## 2024-02-04 DIAGNOSIS — Z79899 Other long term (current) drug therapy: Secondary | ICD-10-CM | POA: Diagnosis not present

## 2024-02-04 DIAGNOSIS — Z299 Encounter for prophylactic measures, unspecified: Secondary | ICD-10-CM | POA: Diagnosis not present

## 2024-02-04 DIAGNOSIS — H903 Sensorineural hearing loss, bilateral: Secondary | ICD-10-CM | POA: Diagnosis not present

## 2024-02-09 DIAGNOSIS — R5383 Other fatigue: Secondary | ICD-10-CM | POA: Diagnosis not present

## 2024-02-09 DIAGNOSIS — Z79899 Other long term (current) drug therapy: Secondary | ICD-10-CM | POA: Diagnosis not present

## 2024-02-09 DIAGNOSIS — E7800 Pure hypercholesterolemia, unspecified: Secondary | ICD-10-CM | POA: Diagnosis not present

## 2024-02-10 ENCOUNTER — Ambulatory Visit (INDEPENDENT_AMBULATORY_CARE_PROVIDER_SITE_OTHER): Admitting: Gastroenterology

## 2024-02-10 ENCOUNTER — Other Ambulatory Visit: Payer: Self-pay

## 2024-02-10 ENCOUNTER — Encounter (INDEPENDENT_AMBULATORY_CARE_PROVIDER_SITE_OTHER): Payer: Self-pay | Admitting: Gastroenterology

## 2024-02-10 VITALS — BP 125/68 | HR 76 | Temp 97.0°F | Ht 61.0 in | Wt 84.9 lb

## 2024-02-10 DIAGNOSIS — E440 Moderate protein-calorie malnutrition: Secondary | ICD-10-CM

## 2024-02-10 DIAGNOSIS — K3184 Gastroparesis: Secondary | ICD-10-CM

## 2024-02-10 DIAGNOSIS — K224 Dyskinesia of esophagus: Secondary | ICD-10-CM | POA: Diagnosis not present

## 2024-02-10 NOTE — Patient Instructions (Addendum)
-  continue high protein/high calorie diet with smaller meals throughout the day -try reglan  3 times per day, can take this even if not eating -liberalize diet   Follow up 3 months  It was a pleasure to see you today. I want to create trusting relationships with patients and provide genuine, compassionate, and quality care. I truly value your feedback! please be on the lookout for a survey regarding your visit with me today. I appreciate your input about our visit and your time in completing this!    Athel Merriweather L. Tate Zagal, MSN, APRN, AGNP-C Adult-Gerontology Nurse Practitioner [facility] at [facility]

## 2024-02-10 NOTE — Progress Notes (Addendum)
 Referring Provider: [name], MD Primary Care Physician:  [name], MD Primary GI Physician: Dr. Eartha   Chief Complaint  Patient presents with   Follow-up    Pt arrives for follow up. Unable to gain weight, unable to eat. No n/v lately. Mostly eating 2 meals a day; tries to make 3 meals(usually does not get up in time for breakfast). Still drinking 1/2 protein shakes.    HPI:   Claudia [name] is a 78 y.o. female with past medical history of f recurrent small cell lung cancer s/p chemo with carboplatin  and etoposide , s/p radiation therapy and prophylactic cranial radiation, recurrent malignancy was treated with wedge resection, possible lymphocytic colitis, COPD, GERD, severe gastroparesis, possible achalasia    Patient presenting today for:  Follow up of gastroparesis, esophageal dysmotility, protein calorie malnutrition  Last seen August, at that time doing some better. Not having much episodes of spitting up/vomiting. Denies nausea, no dysphagia, no heartburn.  eats about 2 meals per day with a snack. Cannot tolerate larger meals as she feels full easily. trying to do atleast 1/2 protein shake per day. 1 BM per day that is looser but no incontinence, she does report taking magnesium  which she thinks keeps her bowels moving well. Did not start prucalopride as it was not covered by her insurance.  only taking reglan  only once per day as she notes she does not want to delay eating if she is hungry and was told she should take it atleast 30 minutes prior to eating.   Recommended high protein/high calorie diet with smaller portions throughout the day, reglan  2-3 times per day, liberalize diet  Present: Weight stable at 84 pounds today  She feels appetite may be slightly better. She was able to eat a whole hot dog the other week, previously would only be able to eat 1/2 of this. No nausea, spitting up recently. Eating about 2 smaller meals per day, usually brunch, a snack and  then will make an evening meal but does not often eat much of it. She is taking reglan  usually twice per day but notes that she does not always know when she is going to eat so she sometimes does not take it before meals. Bowels are moving well without any issues. No dysphagia or odynophagia. She is doing 1/2 protein shake   Barium esophagram 04/2022: concerning for achalasia  Last Colonoscopy:Last Colonoscopy:2013 - sigmoid diverticulosis, patchy loss of vascular marking, biopsies performed but no results available Last Endoscopy:5/2/23Fluid in the esophagus. Fluid aspiration performed. - A large amount of food (residue) in the stomach. - Normal examined duodenum GES:08/13/21: severe delay in gastric emptying, recommend eat small meals throughout the day Filed Weights   02/10/24 1108  Weight: 84 lb 14.4 oz (38.5 kg)     Past Medical History:  Diagnosis Date   Achalasia    Anemia    Arthritis    Bradycardia    mild,may be due to beta blocker therapy   COPD (chronic obstructive pulmonary disease) (HCC)    Gastroparesis    GERD (gastroesophageal reflux disease)    otc   Hypertension 06/01/2019   pt denies   Local recurrence of lung cancer (HCC) dx'd 07/2013   rt thoracotomy chemo comp 12/2013   Lung cancer (HCC) 03/30/2008   Dr. Sherrod, finished chemo, sp radiation, left upper    Lymphocytic colitis    Pneumonia     Past Surgical History:  Procedure Laterality Date   BRONCHIAL  BIOPSY  05/28/2022   Procedure: BRONCHIAL BIOPSIES;  Surgeon: Gladis Leonor HERO, MD;  Location: [facility] ENDOSCOPY;  Service: Pulmonary;;   BRONCHIAL BRUSHINGS  05/28/2022   Procedure: BRONCHIAL BRUSHINGS;  Surgeon: Gladis Leonor HERO, MD;  Location: [facility] ENDOSCOPY;  Service: Pulmonary;;   BRONCHIAL NEEDLE ASPIRATION BIOPSY  05/28/2022   Procedure: BRONCHIAL NEEDLE ASPIRATION BIOPSIES;  Surgeon: Gladis Leonor HERO, MD;  Location: [facility] ENDOSCOPY;  Service: Pulmonary;;   BRONCHIAL WASHINGS  05/28/2022   Procedure:  BRONCHIAL WASHINGS;  Surgeon: Gladis Leonor HERO, MD;  Location: [facility] ENDOSCOPY;  Service: Pulmonary;;   BRONCHOSCOPY  2011   DILATION AND CURETTAGE OF UTERUS     ESOPHAGOGASTRODUODENOSCOPY (EGD) WITH PROPOFOL  N/A 07/29/2021   Procedure: ESOPHAGOGASTRODUODENOSCOPY (EGD) WITH PROPOFOL ;  Surgeon: Eartha Angelia Sieving, MD;  Location: [facility] ENDO SUITE;  Service: Gastroenterology;  Laterality: N/A;  1235 ASA 2   HEMOSTASIS CONTROL  05/28/2022   Procedure: HEMOSTASIS CONTROL;  Surgeon: Gladis Leonor HERO, MD;  Location: [facility] ENDOSCOPY;  Service: Pulmonary;;   IR IMAGING GUIDED PORT INSERTION  08/12/2022   NECK SURGERY  1980's   THORACOTOMY Right 09/21/2013   Procedure: THORACOTOMY MAJOR;  Surgeon: Dorise MARLA Fellers, MD;  Location: [facility] OR;  Service: Thoracic;  Laterality: Right;   UTERINE FIBROID SURGERY  2012   WEDGE RESECTION Right 09/21/2013   Procedure: RIGHT UPPER LOBE WEDGE RESECTION;  Surgeon: Dorise MARLA Fellers, MD;  Location: [facility] OR;  Service: Thoracic;  Laterality: Right;    Current Outpatient Medications  Medication Sig Dispense Refill   cyanocobalamin  (VITAMIN B12) 1000 MCG tablet Take 1,000 mcg by mouth daily.     magnesium  oxide (MAG-OX) 400 (240 Mg) MG tablet TAKE 1 TABLET BY MOUTH DAILY 60 tablet 2   metoCLOPramide  (REGLAN ) 5 MG tablet Take 1 tablet (5 mg total) by mouth 3 (three) times daily before meals. (Patient taking differently: Take 5 mg by mouth 3 (three) times daily before meals. daily) 180 tablet 3   tretinoin (RETIN-A) 0.1 % cream Apply 1 application  topically at bedtime.     triamcinolone  cream (KENALOG ) 0.1 % Apply 1 Application topically 2 (two) times daily. 60 g 2   Calcium  Carbonate Antacid (TUMS PO) Take 500-1,000 mg by mouth 3 (three) times daily as needed (indigestion/heartburn.). (Patient not taking: Reported on 02/10/2024)     clotrimazole-betamethasone (LOTRISONE) cream Apply topically 2 (two) times daily as needed. (Patient not taking: Reported on 02/10/2024)     No current  facility-administered medications for this visit.    Allergies as of 02/10/2024 - Review Complete 02/10/2024  Allergen Reaction Noted   Azithromycin Shortness Of Breath and Rash 04/09/2011   Tussionex pennkinetic er [hydrocod poli-chlorphe poli er] Shortness Of Breath and Rash 04/09/2011   Omeprazole  Rash 11/13/2021    Social History   Socioeconomic History   Marital status: Married    Spouse name: Not on file   Number of children: Not on file   Years of education: Not on file   Highest education level: Not on file  Occupational History   Occupation: clerical    Employer: [employer]  Tobacco Use   Smoking status: Former    Current packs/day: 0.00    Average packs/day: 1 pack/day for 35.0 years (35.0 ttl pk-yrs)    Types: Cigarettes    Start date: 03/30/1972    Quit date: 03/31/2007    Years since quitting: 16.8    Passive exposure: Past   Smokeless tobacco: Never  Vaping Use   Vaping status: Never  Used  Substance and Sexual Activity   Alcohol  use: Yes    Comment: occassional about 3 drinks per year   Drug use: No   Sexual activity: Never  Other Topics Concern   Not on file  Social History Narrative   Not on file   Social Drivers of Health   Financial Resource Strain: Not on file  Food Insecurity: No Food Insecurity (08/06/2022)   Hunger Vital Sign    Worried About Running Out of Food in the Last Year: Never true    Ran Out of Food in the Last Year: Never true  Transportation Needs: No Transportation Needs (08/06/2022)   PRAPARE - Administrator, Civil Service (Medical): No    Lack of Transportation (Non-Medical): No  Physical Activity: Not on file  Stress: Not on file  Social Connections: Not on file    Review of systems General: negative for malaise, night sweats, fever, chills, weight loss Neck: Negative for lumps, goiter, pain and significant neck swelling Resp: Negative for cough, wheezing, dyspnea at rest CV: Negative for chest pain, leg  swelling, palpitations, orthopnea GI: denies melena, hematochezia, nausea, vomiting, diarrhea, constipation, dysphagia, odyonophagia, early satiety or unintentional weight loss. +low appetite  MSK: Negative for joint pain or swelling, back pain, and muscle pain. Derm: Negative for itching or rash Psych: Denies depression, anxiety, memory loss, confusion. No homicidal or suicidal ideation.  Heme: Negative for prolonged bleeding, bruising easily, and swollen nodes. Endocrine: Negative for cold or heat intolerance, polyuria, polydipsia and goiter. Neuro: negative for tremor, gait imbalance, syncope and seizures. The remainder of the review of systems is noncontributory.  Physical Exam: BP 125/68   Pulse 76   Temp (!) 97 F (36.1 C)   Ht 5' 1 (1.549 m)   Wt 84 lb 14.4 oz (38.5 kg)   BMI 16.04 kg/m  General:   Alert and oriented. No distress noted. Pleasant and cooperative.  Head:  Normocephalic and atraumatic. Eyes:  Conjuctiva clear without scleral icterus. Mouth:  Oral mucosa pink and moist. Good dentition. No lesions. Heart: Normal rate and rhythm, s1 and s2 heart sounds present.  Lungs: Clear lung sounds in all lobes. Respirations equal and unlabored. Abdomen:  +BS, soft, non-tender and non-distended. No rebound or guarding. No HSM or masses noted. Derm: No palmar erythema or jaundice Msk:  Symmetrical without gross deformities. Normal posture. Extremities:  Without edema. Neurologic:  Alert and  oriented x4 Psych:  Alert and cooperative. Normal mood and affect.  Invalid input(s): 6 MONTHS   ASSESSMENT: FREYJA GOVEA is a 78 y.o. female presenting today for follow up of gastroparesis, esophageal dysmotility, protein calorie malnutrition   No dysphagia currently. Still with low appetite and some early satiety but feels this has improved slightly on reglan , likely secondary to her gastroparesis. taking reglan  usually BID.  Her insurance did not cover prucalopride prescribed  previously. Her weight Is stable (84 pounds today) and she is trying to get in plenty of protein. Her bowels seem to be moving well with use of magnesium . We discussed continued smaller, high calorie, high protein meals throughout the day and continued protein shake as tolerated. I did encourage her again to try and take reglan  3 times per day before each meal, we discussed what reglan  does and how this may help with her appetite as it helps to prompt gastric emptying. Overall, I am happy that she is maintaining her weight, and we discussed that while she may not be gaining,  we want to avoid any decline in her weight. We discussed potential side effects of reglan , she has none at this time, may consider switching to domperidone at follow up if still doing well on this.    PLAN:  -continue reglan  5mg , take TID  -consider switching to domperidone at follow up visit -small, high protein, high calorie meals   All questions were answered, patient verbalized understanding and is in agreement with plan as outlined above.   Follow Up: 3 months   March Joos L. Mariette, MSN, APRN, AGNP-C Adult-Gerontology Nurse Practitioner [facility]  I have reviewed the note and agree with the APP's assessment as described in this progress note  Toribio Fortune, MD Gastroenterology and Hepatology [facility] Gastroenterology

## 2024-02-11 ENCOUNTER — Other Ambulatory Visit: Payer: Self-pay | Admitting: Oncology

## 2024-02-11 DIAGNOSIS — C3411 Malignant neoplasm of upper lobe, right bronchus or lung: Secondary | ICD-10-CM

## 2024-02-14 ENCOUNTER — Inpatient Hospital Stay

## 2024-02-14 ENCOUNTER — Inpatient Hospital Stay: Attending: Nurse Practitioner

## 2024-02-14 ENCOUNTER — Encounter: Payer: Self-pay | Admitting: Oncology

## 2024-02-14 VITALS — BP 109/66 | HR 81 | Temp 97.5°F | Resp 18

## 2024-02-14 DIAGNOSIS — Z5112 Encounter for antineoplastic immunotherapy: Secondary | ICD-10-CM | POA: Insufficient documentation

## 2024-02-14 DIAGNOSIS — C3411 Malignant neoplasm of upper lobe, right bronchus or lung: Secondary | ICD-10-CM

## 2024-02-14 DIAGNOSIS — C3481 Malignant neoplasm of overlapping sites of right bronchus and lung: Secondary | ICD-10-CM | POA: Insufficient documentation

## 2024-02-14 DIAGNOSIS — Z79620 Long term (current) use of immunosuppressive biologic: Secondary | ICD-10-CM | POA: Insufficient documentation

## 2024-02-14 LAB — CBC WITH DIFFERENTIAL/PLATELET
Abs Immature Granulocytes: 0.03 K/uL (ref 0.00–0.07)
Basophils Absolute: 0 K/uL (ref 0.0–0.1)
Basophils Relative: 1 %
Eosinophils Absolute: 0 K/uL (ref 0.0–0.5)
Eosinophils Relative: 1 %
HCT: 29.8 % — ABNORMAL LOW (ref 36.0–46.0)
Hemoglobin: 9.7 g/dL — ABNORMAL LOW (ref 12.0–15.0)
Immature Granulocytes: 1 %
Lymphocytes Relative: 9 %
Lymphs Abs: 0.4 K/uL — ABNORMAL LOW (ref 0.7–4.0)
MCH: 32.4 pg (ref 26.0–34.0)
MCHC: 32.6 g/dL (ref 30.0–36.0)
MCV: 99.7 fL (ref 80.0–100.0)
Monocytes Absolute: 0.9 K/uL (ref 0.1–1.0)
Monocytes Relative: 20 %
Neutro Abs: 3 K/uL (ref 1.7–7.7)
Neutrophils Relative %: 68 %
Platelets: 134 K/uL — ABNORMAL LOW (ref 150–400)
RBC: 2.99 MIL/uL — ABNORMAL LOW (ref 3.87–5.11)
RDW: 14.9 % (ref 11.5–15.5)
WBC: 4.3 K/uL (ref 4.0–10.5)
nRBC: 0 % (ref 0.0–0.2)

## 2024-02-14 LAB — COMPREHENSIVE METABOLIC PANEL WITH GFR
ALT: 6 U/L (ref 0–44)
AST: 17 U/L (ref 15–41)
Albumin: 3.7 g/dL (ref 3.5–5.0)
Alkaline Phosphatase: 79 U/L (ref 38–126)
Anion gap: 6 (ref 5–15)
BUN: 21 mg/dL (ref 8–23)
CO2: 30 mmol/L (ref 22–32)
Calcium: 9.8 mg/dL (ref 8.9–10.3)
Chloride: 104 mmol/L (ref 98–111)
Creatinine, Ser: 0.76 mg/dL (ref 0.44–1.00)
GFR, Estimated: >60 mL/min (ref >60)
Glucose, Bld: 92 mg/dL (ref 70–99)
Potassium: 3.9 mmol/L (ref 3.5–5.1)
Sodium: 140 mmol/L (ref 135–145)
Total Bilirubin: 0.7 mg/dL (ref 0.0–1.2)
Total Protein: 6.3 g/dL — ABNORMAL LOW (ref 6.5–8.1)

## 2024-02-14 LAB — TSH: TSH: 1.04 u[IU]/mL (ref 0.350–4.500)

## 2024-02-14 LAB — MAGNESIUM: Magnesium: 1.9 mg/dL (ref 1.7–2.4)

## 2024-02-14 MED ORDER — SODIUM CHLORIDE 0.9 % IV SOLN: Freq: Once | INTRAVENOUS | Status: AC

## 2024-02-14 MED ORDER — SODIUM CHLORIDE 0.9 % IV SOLN
1500.0000 mg | Freq: Once | INTRAVENOUS | Status: AC
  Administered 2024-02-14: 1500 mg via INTRAVENOUS
  Filled 2024-02-14: qty 30

## 2024-02-14 NOTE — Progress Notes (Signed)
 Patient tolerated therapy with no complaints voiced.  Side effects with management reviewed with understanding verbalized.  Port site clean and dry with no bruising or swelling noted at site.  Good blood return noted before and after administration of therapy.  Band aid applied.  Patient left in satisfactory condition with VSS and no s/s of distress noted.

## 2024-02-14 NOTE — Patient Instructions (Signed)
 [facility] [place] - A DEPT OF [facility]  Discharge Instructions: Thank you for choosing [facility] to provide your oncology and hematology care.  If you have a lab appointment with the Cancer Center - please note that after April 8th, 2024, all labs will be drawn in the cancer center.  You do not have to check in or register with the main entrance as you have in the past but will complete your check-in in the cancer center.  Wear comfortable clothing and clothing appropriate for easy access to any Portacath or PICC line.   We strive to give you quality time with your provider. You may need to reschedule your appointment if you arrive late (15 or more minutes).  Arriving late affects you and other patients whose appointments are after yours.  Also, if you miss three or more appointments without notifying the office, you may be dismissed from the clinic at the provider's discretion.      For prescription refill requests, have your pharmacy contact our office and allow 72 hours for refills to be completed.    Today you received the following chemotherapy and/or immunotherapy agents imfinzi.       To help prevent nausea and vomiting after your treatment, we encourage you to take your nausea medication as directed.  BELOW ARE SYMPTOMS THAT SHOULD BE REPORTED IMMEDIATELY: *FEVER GREATER THAN 100.4 F (38 C) OR HIGHER *CHILLS OR SWEATING *NAUSEA AND VOMITING THAT IS NOT CONTROLLED WITH YOUR NAUSEA MEDICATION *UNUSUAL SHORTNESS OF BREATH *UNUSUAL BRUISING OR BLEEDING *URINARY PROBLEMS (pain or burning when urinating, or frequent urination) *BOWEL PROBLEMS (unusual diarrhea, constipation, pain near the anus) TENDERNESS IN MOUTH AND THROAT WITH OR WITHOUT PRESENCE OF ULCERS (sore throat, sores in mouth, or a toothache) UNUSUAL RASH, SWELLING OR PAIN  UNUSUAL VAGINAL DISCHARGE OR ITCHING   Items with * indicate a potential emergency and should be followed up  as soon as possible or go to the Emergency Department if any problems should occur.  Please show the CHEMOTHERAPY ALERT CARD or IMMUNOTHERAPY ALERT CARD at check-in to the Emergency Department and triage nurse.  Should you have questions after your visit or need to cancel or reschedule your appointment, please contact [facility] [place] - A DEPT OF [facility] [phone]  and follow the prompts.  Office hours are 8:00 a.m. to 4:30 p.m. Monday - Friday. Please note that voicemails left after 4:00 p.m. may not be returned until the following business day.  We are closed weekends and major holidays. You have access to a nurse at all times for urgent questions. Please call the main number to the clinic [phone] and follow the prompts.  For any non-urgent questions, you may also contact your provider using MyChart. We now offer e-Visits for anyone 45 and older to request care online for non-urgent symptoms. For details visit [email or web address].   Also download the MyChart app! Go to the app store, search "MyChart", open the app, select [place], and log in with your MyChart username and password.

## 2024-02-15 LAB — T4: T4, Total: 7.9 ug/dL (ref 4.5–12.0)

## 2024-03-03 ENCOUNTER — Ambulatory Visit (HOSPITAL_COMMUNITY)

## 2024-03-03 ENCOUNTER — Other Ambulatory Visit: Payer: Self-pay

## 2024-03-12 NOTE — Progress Notes (Unsigned)
 Patient Care Team: [name], MD as PCP - General (Internal Medicine) [name], MD as PCP - Cardiology (Cardiology) [name], MD as Consulting Physician (Gastroenterology) [name], RN as Oncology Nurse Navigator (Medical Oncology)  Clinic Day:  03/12/2024  Referring physician: [name], MD   CHIEF COMPLAINT:  CC: Recurrent extensive stage small cell lung cancer   ASSESSMENT & PLAN:   Assessment & Plan: Claudia [name]  is a 78 y.o. female with recurrent extensive stage small cell lung cancer   Small cell carcinoma of overlapping sites of right lung (HCC) Recurrent extensive stage small cell lung cancer.  Extensive oncology history below. Completed induction chemotherapy with carboplatin , etoposide  and durvalumab  recently Has been on maintenance durvalumab  for the past 1 year. Recent CT scan stable with some findings of may be pneumonitis.  Patient is asymptomatic.  - Continue durvalumab  1500mg  every 4 weeks.  Patient reports no immunotherapy related side effects. - Labs reviewed today: CMP: Normal creatinine, normal liver function test, CBC: Hemoglobin: 10.1, platelets: 147, WBC: 4.7, TSH: 1.500 -Physical exam stable today.  Proceed with immunotherapy today. -Will repeat CT scan in 3 months. I.e 02/2024 but patient already moved to January 7. - Recent MRI brain with no evidence of intracranial disease.  Will repeat MRI brain yearly. Due 12/2025  Return to clinic in 1 months with CT CAP for follow up.  Anemia due to antineoplastic chemotherapy  Normocytic anemia likely secondary to lung carcinoma. Iron panel within normal limits  but with elevated ferritin. Borderline low vitamin B12 levels   - Continue over-the-counter vitamin B12 1000 mg daily. - Will continue to monitor  Rash  Patient has a edematous rash on her right hand.  Likely secondary to immunotherapy.  Significantly improved today.   -Continue triamcinolone  0.1% to be applied twice  daily - Recommended patient to watch out for worsening of the rash   The patient understands the plans discussed today and is in agreement with them.  She knows to contact our office if she develops concerns prior to her next appointment.  The total time spent in the appointment was 13 minutes for the encounter with patient, including review of chart and various tests results, discussions about plan of care and coordination of care plan   Mickiel Dry, MD  [facility] [place] - A DEPT OF [facility] [address] [place] [address] Dept: [phone] Dept Fax: [phone]   No orders of the defined types were placed in this encounter.    ONCOLOGY HISTORY:   I have reviewed her chart and materials related to her cancer extensively and collaborated history with the patient. Summary of oncologic history is as follows:   Diagnosis: Recurrent extensive stage small cell lung cancer  -Initial Presentation: Smoking history with unintentional weight loss and decreased appetite -08/15/2009: Chest X-ray: Left hilar/perihilar mass suspicious for primary lung carcinoma, 2.9 x 3.4 cm in size. -08/15/2009: CT chest and abdomen: Left perihilar upper lobe mass, up to 3.6 cm. The mass is difficult to measure due to its branching and oblique orientation within the left upper lobe as well as its proximity to the enlarged left hilar lymph nodes. Findings most compatible with primary lung carcinoma. Tiny low density lesion posteriorly in the liver, too small to characterize.  -08/30/2009: LUL Lung biopsy and FNA.  Pathology: Small cell undifferentiated carcinoma. Positive malignant cells consistent with small cell carcinoma on FNA.  -  09/04/2009: CT Head: No evidence of metastatic disease.  -09/04/2009: Initial PET: The central left upper lobe mass with left hilar lymphadenopathy shows neoplastic range FDG accumulation.  No evidence for  hypermetabolism in the subcarinal station or contralateral mediastinum/hilum.  No distant hypermetabolic disease to suggest distant metastases.  -11/14/2009: Concurrent chemoradiation with 4 cycles of carboplatin  and etoposide  completed -11/29/2009: CT chest: The previously described left hilar mass and adjacent lymph node are no longer seen.  Several sub centimeter pulmonary nodules are noted without significant change compared to prior exam.  -11/2009-01/31/2013: Patient under surveillance, NED seen on imaging -12/30/2009: MRI brain: No evidence of intracranial metastatic disease.  -01/28/2010: PCI completed -01/31/2013: CT chest: New onset of tiny subcentimeter clusters of nodules in the right lung. These may be inflammatory and/ or granulomatous. Metastatic disease cannot be excluded.  -07/28/2013: CT chest: Several nodular densities in the right lung, most of which are stable. There is a slowly enlarging and spiculated appearing nodule in the anterior inferior right upper lobe, measuring 11 x 8 mm on today's study compared to 10 x 6 mm previously.  -08/30/2013: PET: There is malignant range FDG uptake associated with the right upper lobe nodule. This is worrisome for either synchronous right upper lobe primary or metastatic disease. The other smaller nodules in the right lung are too small to characterize. There is a nodular focus of malignant range FDG uptake along the medial margin of radiation change in the left lung. This is a nonspecific focus and may reflect inflammation. Residual or recurrent local tumor is not excluded.  -09/21/2013: Wedge resection of RUL Pathology: Poorly differentiated carcinoma. Tumor cells are positive for CD56, synaptophysin, cytokeratin AE1/3, and p63  on staining. Poorly differentiated carcinoma with neuroendocrine differentiation  -10/30/2013-01/08/2014: 4 cycles of adjuvant carboplatin  and etoposide   -01/22/2014-04/06/2022: Patient under observation, NED seen on  imaging -04/06/2022: CT Chest: Areas amidst post treatment change in collapsed lung in the LEFT upper lobe that raise the question of disease recurrence and or early metastasis. Scattered small nodules throughout the LEFT chest have increased and or are new since previous imaging. Based on basilar distribution of some of these findings would also consider the possibility of aspiration related changes -05/28/2022: Bronchoscopy with bronchial biopsies and FNA.   Cytology: Negative for malignancy.  -08/03/2022: CT chest: Post radiation scarring in the medial left upper lobe with abnormal hypermetabolism seen in the anterior left upper lobe on 04/23/2022, compatible with residual/recurrent disease. Scattered new nodular densities in the lungs bilaterally, largest in the left lower lobe, compatible with progressive metastatic disease. New subcentimeter hypodense lesion in the left hepatic lobe with bulky periportal adenopathy, indicative of new metastatic disease.  -As the new recurrence is in the same area as previous recurrence, did not recommend rebiopsy to avoid complications (per documentation) -08/12/2022: Port inserted -08/13/2022: PET: Radiation changes in the left upper lobe with focal hypermetabolism suggestive of residual/recurrent tumor, although improved from prior PET. Dominant 8 mm subpleural nodule in the left lower lobe, unchanged from recent CT and new from prior PET. Metastatic disease not excluded, although infection/inflammation is also possible. Additional prior hypermetabolic nodules on PET have resolved. No evidence of metastatic disease in the abdomen/pelvis. -08/14/2022: MRI Brain: No metastatic disease or acute intracranial abnormality.  -08/17/2022-11/02/2022: 4 cycles of carboplatin , etoposide , and durvalumab  -03/09/2023: CT CAP: Worsened left upper lobe/apical aeration since 10/21/2022. Increased ground-glass and septal thickening in the apex with progressive posterior left upper  lobe collapse/consolidation. Considerations include progression of radiation change and/or  superimposed infection. Not a typical appearance of local recurrence, which cannot be entirely excluded. Diffuse pulmonary nodularity, including new bilateral lower lobe pulmonary nodules as detailed above. No acute process or evidence of metastatic disease in the abdomen or pelvis. -11/23/2022-current: Maintenance durvalumab   -03/09/2023: CT CAP with contrast: Increased ground-glass and septal thickening in the apex with progressive posterior left upper lobe collapse/consolidation. Considerations include progression of radiation change and/or superimposed infection. Not a typical appearance of local recurrence, which cannot be entirely excluded. Diffuse pulmonary nodularity, including new bilateral lower lobe pulmonary nodules. Given the appearance of the esophagus, this could be related to chronic aspiration. Metastasis cannot be excluded. No acute process or evidence of metastatic disease in the abdomen or pelvis. Incidental findings, including:  -05/03/2023-12/15/2023: Relatively stable imaging -01/03/2024: MR Brain with contrast : No brain metastases identified.    Current Treatment:  Maintenance durvalumab  1500 mg every 4 weeks  INTERVAL HISTORY:   Discussed the use of AI scribe software for clinical note transcription with the patient, who gave verbal consent to proceed.  History of Present Illness Claudia [name] is a 78 year old female with small cell lung cancer undergoing systemic therapy who presents for routine oncology follow-up and treatment.  She is receiving ongoing systemic therapy for small cell lung cancer, with the next surveillance scan scheduled for January after a prior postponement. She denies new symptoms, including diarrhea, abdominal pain, cough, or dyspnea. She reports improving energy levels and denies weight loss, fevers, or fatigue.  She continues vitamin B12 supplementation.  She experiences easy bruising with minor trauma but has no concerns regarding bleeding.  She reports that her dermatitis has improved and she continues to use topical therapy. She notes persistent erythema, likely exacerbated by hot baths, but distinguishes this from her prior rash.   I have reviewed the past medical history, past surgical history, social history and family history with the patient and they are unchanged from previous note.  ALLERGIES:  is allergic to azithromycin, tussionex pennkinetic er [hydrocod poli-chlorphe poli er], and omeprazole .  MEDICATIONS:  Current Outpatient Medications  Medication Sig Dispense Refill   Calcium  Carbonate Antacid (TUMS PO) Take 500-1,000 mg by mouth 3 (three) times daily as needed (indigestion/heartburn.). (Patient not taking: Reported on 02/10/2024)     clotrimazole-betamethasone (LOTRISONE) cream Apply topically 2 (two) times daily as needed. (Patient not taking: Reported on 02/10/2024)     cyanocobalamin  (VITAMIN B12) 1000 MCG tablet Take 1,000 mcg by mouth daily.     magnesium  oxide (MAG-OX) 400 (240 Mg) MG tablet TAKE 1 TABLET BY MOUTH DAILY 60 tablet 2   metoCLOPramide  (REGLAN ) 5 MG tablet Take 1 tablet (5 mg total) by mouth 3 (three) times daily before meals. (Patient taking differently: Take 5 mg by mouth 3 (three) times daily before meals. daily) 180 tablet 3   tretinoin (RETIN-A) 0.1 % cream Apply 1 application  topically at bedtime.     triamcinolone  cream (KENALOG ) 0.1 % Apply 1 Application topically 2 (two) times daily. 60 g 2   No current facility-administered medications for this visit.    REVIEW OF SYSTEMS:   Constitutional: Denies fevers, chills or abnormal weight loss Eyes: Denies blurriness of vision Ears, nose, mouth, throat, and face: Denies mucositis or sore throat Respiratory: Denies cough, dyspnea or wheezes Cardiovascular: Denies palpitation, chest discomfort or lower extremity swelling Gastrointestinal:  Denies  nausea, heartburn or change in bowel habits Skin: Denies abnormal skin rashes Lymphatics: Denies new lymphadenopathy or easy bruising Neurological:Denies numbness, tingling or  new weaknesses Behavioral/Psych: Mood is stable, no new changes   All other systems were reviewed with the patient and are negative.   VITALS:  There were no vitals taken for this visit.  Wt Readings from Last 3 Encounters:  02/14/24 83 lb 5.3 oz (37.8 kg)  02/10/24 84 lb 14.4 oz (38.5 kg)  01/17/24 84 lb 9.6 oz (38.4 kg)    There is no height or weight on file to calculate BMI.  Performance status (ECOG): 2 - Symptomatic, <50% confined to bed  PHYSICAL EXAM:   GENERAL:alert, no distress and comfortable, very thin female SKIN: Erythematous rash on her right hand - improved LYMPH:  no palpable lymphadenopathy in the cervical, axillary or inguinal LUNGS: clear to auscultation and percussion with normal breathing effort HEART: regular rate & rhythm and no murmurs and no lower extremity edema ABDOMEN:abdomen soft, non-tender and normal bowel sounds Musculoskeletal:no cyanosis of digits and no clubbing  NEURO: alert & oriented x 3 with fluent speech  LABORATORY DATA:  I have reviewed the data as listed   Lab Results  Component Value Date   WBC 4.3 02/14/2024   NEUTROABS 3.0 02/14/2024   HGB 9.7 (L) 02/14/2024   HCT 29.8 (L) 02/14/2024   MCV 99.7 02/14/2024   PLT 134 (L) 02/14/2024      Chemistry      Component Value Date/Time   NA 140 02/14/2024 1019   NA 141 11/02/2016 0919   K 3.9 02/14/2024 1019   K 4.2 11/02/2016 0919   CL 104 02/14/2024 1019   CL 103 08/03/2012 0842   CO2 30 02/14/2024 1019   CO2 29 11/02/2016 0919   BUN 21 02/14/2024 1019   BUN 13.9 11/02/2016 0919   CREATININE 0.76 02/14/2024 1019   CREATININE 0.75 05/25/2022 1417   CREATININE 1.0 11/02/2016 0919      Component Value Date/Time   CALCIUM  9.8 02/14/2024 1019   CALCIUM  10.9 (H) 11/02/2016 0919   ALKPHOS 79  02/14/2024 1019   ALKPHOS 78 11/02/2016 0919   AST 17 02/14/2024 1019   AST 13 (L) 05/25/2022 1417   AST 19 11/02/2016 0919   ALT 6 02/14/2024 1019   ALT 6 05/25/2022 1417   ALT 12 11/02/2016 0919   BILITOT 0.7 02/14/2024 1019   BILITOT 0.5 05/25/2022 1417   BILITOT 0.47 11/02/2016 0919       Latest Reference Range & Units 09/27/23 11:49 09/27/23 11:50  Iron 28 - 170 ug/dL  18 (L)  UIBC ug/dL  850  TIBC 749 - 549 ug/dL  832 (L)  Saturation Ratios 10.4 - 31.8 %  11  Ferritin 11 - 307 ng/mL  685 (H)  Folate >5.9 ng/mL  13.5  Copper  80 - 158 ug/dL 897   Vitamin B12 819 - 914 pg/mL  449  (L): Data is abnormally low (H): Data is abnormally high   Latest Reference Range & Units 03/13/24 09:38  TSH 0.350 - 4.500 uIU/mL 1.500    RADIOGRAPHIC STUDIES: I have personally reviewed the radiological images as listed and agreed with the findings in the report.

## 2024-03-13 ENCOUNTER — Inpatient Hospital Stay

## 2024-03-13 ENCOUNTER — Inpatient Hospital Stay: Attending: Nurse Practitioner | Admitting: Oncology

## 2024-03-13 VITALS — BP 122/68 | HR 74 | Temp 97.9°F | Resp 18

## 2024-03-13 DIAGNOSIS — C3411 Malignant neoplasm of upper lobe, right bronchus or lung: Secondary | ICD-10-CM

## 2024-03-13 DIAGNOSIS — D6481 Anemia due to antineoplastic chemotherapy: Secondary | ICD-10-CM | POA: Diagnosis not present

## 2024-03-13 DIAGNOSIS — Z5112 Encounter for antineoplastic immunotherapy: Secondary | ICD-10-CM | POA: Diagnosis present

## 2024-03-13 DIAGNOSIS — R21 Rash and other nonspecific skin eruption: Secondary | ICD-10-CM | POA: Diagnosis not present

## 2024-03-13 DIAGNOSIS — T451X5A Adverse effect of antineoplastic and immunosuppressive drugs, initial encounter: Secondary | ICD-10-CM | POA: Diagnosis not present

## 2024-03-13 DIAGNOSIS — C3481 Malignant neoplasm of overlapping sites of right bronchus and lung: Secondary | ICD-10-CM | POA: Insufficient documentation

## 2024-03-13 DIAGNOSIS — Z79620 Long term (current) use of immunosuppressive biologic: Secondary | ICD-10-CM | POA: Diagnosis not present

## 2024-03-13 LAB — CBC WITH DIFFERENTIAL/PLATELET
Abs Immature Granulocytes: 0.05 K/uL (ref 0.00–0.07)
Basophils Absolute: 0.1 K/uL (ref 0.0–0.1)
Basophils Relative: 1 %
Eosinophils Absolute: 0.1 K/uL (ref 0.0–0.5)
Eosinophils Relative: 1 %
HCT: 31.5 % — ABNORMAL LOW (ref 36.0–46.0)
Hemoglobin: 10.1 g/dL — ABNORMAL LOW (ref 12.0–15.0)
Immature Granulocytes: 1 %
Lymphocytes Relative: 9 %
Lymphs Abs: 0.4 K/uL — ABNORMAL LOW (ref 0.7–4.0)
MCH: 32 pg (ref 26.0–34.0)
MCHC: 32.1 g/dL (ref 30.0–36.0)
MCV: 99.7 fL (ref 80.0–100.0)
Monocytes Absolute: 1 K/uL (ref 0.1–1.0)
Monocytes Relative: 21 %
Neutro Abs: 3.1 K/uL (ref 1.7–7.7)
Neutrophils Relative %: 67 %
Platelets: 147 K/uL — ABNORMAL LOW (ref 150–400)
RBC: 3.16 MIL/uL — ABNORMAL LOW (ref 3.87–5.11)
RDW: 15 % (ref 11.5–15.5)
WBC: 4.7 K/uL (ref 4.0–10.5)
nRBC: 0 % (ref 0.0–0.2)

## 2024-03-13 LAB — COMPREHENSIVE METABOLIC PANEL WITH GFR
ALT: <5 U/L (ref 0–44)
AST: 16 U/L (ref 15–41)
Albumin: 3.8 g/dL (ref 3.5–5.0)
Alkaline Phosphatase: 86 U/L (ref 38–126)
Anion gap: 10 (ref 5–15)
BUN: 20 mg/dL (ref 8–23)
CO2: 26 mmol/L (ref 22–32)
Calcium: 9.6 mg/dL (ref 8.9–10.3)
Chloride: 105 mmol/L (ref 98–111)
Creatinine, Ser: 0.74 mg/dL (ref 0.44–1.00)
GFR, Estimated: >60 mL/min (ref >60)
Glucose, Bld: 87 mg/dL (ref 70–99)
Potassium: 4.1 mmol/L (ref 3.5–5.1)
Sodium: 141 mmol/L (ref 135–145)
Total Bilirubin: 0.5 mg/dL (ref 0.0–1.2)
Total Protein: 6.4 g/dL — ABNORMAL LOW (ref 6.5–8.1)

## 2024-03-13 LAB — MAGNESIUM: Magnesium: 1.7 mg/dL (ref 1.7–2.4)

## 2024-03-13 LAB — TSH: TSH: 1.5 u[IU]/mL (ref 0.350–4.500)

## 2024-03-13 MED ORDER — SODIUM CHLORIDE 0.9 % IV SOLN
1500.0000 mg | Freq: Once | INTRAVENOUS | Status: AC
  Administered 2024-03-13: 12:00:00 1500 mg via INTRAVENOUS
  Filled 2024-03-13: qty 30

## 2024-03-13 MED ORDER — SODIUM CHLORIDE 0.9 % IV SOLN: Freq: Once | INTRAVENOUS | Status: AC

## 2024-03-13 NOTE — Patient Instructions (Signed)
 [facility] - A DEPT OF [facility]  Discharge Instructions: Thank you for choosing [facility] to provide your oncology and hematology care.  If you have a lab appointment with the Cancer Center - please note that after April 8th, 2024, all labs will be drawn in the cancer center.  You do not have to check in or register with the main entrance as you have in the past but will complete your check-in in the cancer center.  Wear comfortable clothing and clothing appropriate for easy access to any Portacath or PICC line.   We strive to give you quality time with your provider. You may need to reschedule your appointment if you arrive late (15 or more minutes).  Arriving late affects you and other patients whose appointments are after yours.  Also, if you miss three or more appointments without notifying the office, you may be dismissed from the clinic at the provider's discretion.      For prescription refill requests, have your pharmacy contact our office and allow 72 hours for refills to be completed.    Today you received the following chemotherapy and/or immunotherapy agents Imfinzi      To help prevent nausea and vomiting after your treatment, we encourage you to take your nausea medication as directed.  BELOW ARE SYMPTOMS THAT SHOULD BE REPORTED IMMEDIATELY: *FEVER GREATER THAN 100.4 F (38 C) OR HIGHER *CHILLS OR SWEATING *NAUSEA AND VOMITING THAT IS NOT CONTROLLED WITH YOUR NAUSEA MEDICATION *UNUSUAL SHORTNESS OF BREATH *UNUSUAL BRUISING OR BLEEDING *URINARY PROBLEMS (pain or burning when urinating, or frequent urination) *BOWEL PROBLEMS (unusual diarrhea, constipation, pain near the anus) TENDERNESS IN MOUTH AND THROAT WITH OR WITHOUT PRESENCE OF ULCERS (sore throat, sores in mouth, or a toothache) UNUSUAL RASH, SWELLING OR PAIN  UNUSUAL VAGINAL DISCHARGE OR ITCHING   Items with * indicate a potential emergency and should be followed up  as soon as possible or go to the Emergency Department if any problems should occur.  Please show the CHEMOTHERAPY ALERT CARD or IMMUNOTHERAPY ALERT CARD at check-in to the Emergency Department and triage nurse.  Should you have questions after your visit or need to cancel or reschedule your appointment, please contact [facility] [place] - A DEPT OF [facility] [phone]  and follow the prompts.  Office hours are 8:00 a.m. to 4:30 p.m. Monday - Friday. Please note that voicemails left after 4:00 p.m. may not be returned until the following business day.  We are closed weekends and major holidays. You have access to a nurse at all times for urgent questions. Please call the main number to the clinic [phone] and follow the prompts.  For any non-urgent questions, you may also contact your provider using MyChart. We now offer e-Visits for anyone 42 and older to request care online for non-urgent symptoms. For details visit [email or web address].   Also download the MyChart app! Go to the app store, search "MyChart", open the app, select [place], and log in with your MyChart username and password.

## 2024-03-13 NOTE — Progress Notes (Signed)
 Patient presents today for Imfinzi  infusion per providers order.  Vital signs and labs reviewed by MD.  Message received from Dr. Davonna patient okay for treatment.  Treatment given today per MD orders.  Stable during infusion without adverse affects.  Vital signs stable.  No complaints at this time.  Discharge from clinic ambulatory in stable condition.  Alert and oriented X 3.  Follow up with [facility] as scheduled.

## 2024-03-14 ENCOUNTER — Other Ambulatory Visit: Payer: Self-pay

## 2024-03-22 ENCOUNTER — Other Ambulatory Visit: Payer: Self-pay

## 2024-03-27 ENCOUNTER — Encounter: Payer: Self-pay | Admitting: *Deleted

## 2024-04-03 DIAGNOSIS — C3411 Malignant neoplasm of upper lobe, right bronchus or lung: Secondary | ICD-10-CM

## 2024-04-04 ENCOUNTER — Other Ambulatory Visit: Payer: Self-pay

## 2024-04-05 ENCOUNTER — Ambulatory Visit (HOSPITAL_COMMUNITY)
Admission: RE | Admit: 2024-04-05 | Discharge: 2024-04-05 | Disposition: A | Discharge status: Home / Self Care | Source: Ambulatory Visit | Attending: Oncology | Admitting: Oncology

## 2024-04-05 DIAGNOSIS — C3411 Malignant neoplasm of upper lobe, right bronchus or lung: Secondary | ICD-10-CM | POA: Insufficient documentation

## 2024-04-05 MED ORDER — IOHEXOL 350 MG/ML SOLN
80.0000 mL | Freq: Once | INTRAVENOUS | Status: AC | PRN
  Administered 2024-04-05: 75 mL via INTRAVENOUS

## 2024-04-09 NOTE — Progress Notes (Signed)
 " Patient Care Team: Claudia Leta NOVAK, MD as PCP - General (Internal Medicine) [name], MD as PCP - Cardiology (Cardiology) [name], MD as Consulting Physician (Gastroenterology) [name], RN as Oncology Nurse Navigator (Medical Oncology)  Clinic Day:  04/10/2024  Referring physician: [name], MD   CHIEF COMPLAINT:  CC: Recurrent extensive stage small cell lung cancer   ASSESSMENT & PLAN:   Assessment & Plan: Claudia [name]  is a 79 y.o. female with recurrent extensive stage small cell lung cancer   Small cell carcinoma of overlapping sites of right lung (HCC) Recurrent extensive stage small cell lung cancer.  Extensive oncology history below. Completed induction chemotherapy with carboplatin , etoposide  and durvalumab  recently Has been on maintenance durvalumab  for the past 1 year. Recent CT scan stable with some findings of may be pneumonitis.  Patient is asymptomatic.  -We reviewed the recent CT scan findings together. There is concern for progression in the left upper lobe. Will obtain a PET scan for further evaluation - Discussed briefly second line treatment options with the patient including retrial of platinum doublet considering patient had > 1 year since last exposure or using tarlatamab .  - Labs reviewed today: CMP: Normal creatinine, normal liver function test, CBC: Hemoglobin: 9.0 platelets: 161, TSH: 2.210 -Will hold treatment today - Recent MRI brain with no evidence of intracranial disease.  Will repeat MRI brain yearly or sooner if patient is symptomatic. Due 12/2025  Return to clinic after PET scan to discuss results and further management  Pelvic mass, likely uterine fibroid Incidental pelvic mass on imaging, likely a uterine fibroid based on prior history and 2024 imaging. Presumed benign but requires further evaluation.  - Ordered pelvic ultrasound to further characterize the uterine mass.  Anemia due to antineoplastic chemotherapy   Normocytic anemia likely secondary to lung carcinoma. Iron panel within normal limits  but with elevated ferritin. Borderline low vitamin B12 levels   - Continue over-the-counter vitamin B12 1000 mg daily. - Will continue to monitor  Rash  Patient has a edematous rash on her right hand.  Likely secondary to immunotherapy.  Significantly improved today.   -Continue triamcinolone  0.1% to be applied twice daily - Recommended patient to watch out for worsening of the rash   The patient understands the plans discussed today and is in agreement with them.  She knows to contact our office if she develops concerns prior to her next appointment.  The total time spent in the appointment was 27 minutes for the encounter with patient, including review of chart and various tests results, discussions about plan of care and coordination of care plan   Mickiel Dry, MD  [facility] [place] - A DEPT OF [facility] [address] [place] [address] Dept: [phone] Dept Fax: [phone]   Orders Placed This Encounter  Procedures   NM PET Image Restag (PS) Skull Base To Thigh    Standing Status:   Future    Expected Date:   04/17/2024    Expiration Date:   04/10/2025    If indicated for the ordered procedure, I authorize the administration of a radiopharmaceutical per Radiology protocol:   Yes    Preferred imaging location?:   [name]   US  Pelvis Complete    Standing Status:   Future    Number of Occurrences:   1    Expiration Date:   04/10/2025  Reason for Exam (SYMPTOM  OR DIAGNOSIS REQUIRED):   pelvic mass    Preferred imaging location?:   [facility]     ONCOLOGY HISTORY:   I have reviewed her chart and materials related to her cancer extensively and collaborated history with the patient. Summary of oncologic history is as follows:   Diagnosis: Recurrent extensive stage small cell lung cancer  -Initial  Presentation: Smoking history with unintentional weight loss and decreased appetite -08/15/2009: Chest X-ray: Left hilar/perihilar mass suspicious for primary lung carcinoma, 2.9 x 3.4 cm in size. -08/15/2009: CT chest and abdomen: Left perihilar upper lobe mass, up to 3.6 cm. The mass is difficult to measure due to its branching and oblique orientation within the left upper lobe as well as its proximity to the enlarged left hilar lymph nodes. Findings most compatible with primary lung carcinoma. Tiny low density lesion posteriorly in the liver, too small to characterize.  -08/30/2009: LUL Lung biopsy and FNA.  Pathology: Small cell undifferentiated carcinoma. Positive malignant cells consistent with small cell carcinoma on FNA.  -09/04/2009: CT Head: No evidence of metastatic disease.  -09/04/2009: Initial PET: The central left upper lobe mass with left hilar lymphadenopathy shows neoplastic range FDG accumulation.  No evidence for hypermetabolism in the subcarinal station or contralateral mediastinum/hilum.  No distant hypermetabolic disease to suggest distant metastases.  -11/14/2009: Concurrent chemoradiation with 4 cycles of carboplatin  and etoposide  completed -11/29/2009: CT chest: The previously described left hilar mass and adjacent lymph node are no longer seen.  Several sub centimeter pulmonary nodules are noted without significant change compared to prior exam.  -11/2009-01/31/2013: Patient under surveillance, NED seen on imaging -12/30/2009: MRI brain: No evidence of intracranial metastatic disease.  -01/28/2010: PCI completed -01/31/2013: CT chest: New onset of tiny subcentimeter clusters of nodules in the right lung. These may be inflammatory and/ or granulomatous. Metastatic disease cannot be excluded.  -07/28/2013: CT chest: Several nodular densities in the right lung, most of which are stable. There is a slowly enlarging and spiculated appearing nodule in the anterior inferior right  upper lobe, measuring 11 x 8 mm on today's study compared to 10 x 6 mm previously.  -08/30/2013: PET: There is malignant range FDG uptake associated with the right upper lobe nodule. This is worrisome for either synchronous right upper lobe primary or metastatic disease. The other smaller nodules in the right lung are too small to characterize. There is a nodular focus of malignant range FDG uptake along the medial margin of radiation change in the left lung. This is a nonspecific focus and may reflect inflammation. Residual or recurrent local tumor is not excluded.  -09/21/2013: Wedge resection of RUL Pathology: Poorly differentiated carcinoma. Tumor cells are positive for CD56, synaptophysin, cytokeratin AE1/3, and p63  on staining. Poorly differentiated carcinoma with neuroendocrine differentiation  -10/30/2013-01/08/2014: 4 cycles of adjuvant carboplatin  and etoposide   -01/22/2014-04/06/2022: Patient under observation, NED seen on imaging -04/06/2022: CT Chest: Areas amidst post treatment change in collapsed lung in the LEFT upper lobe that raise the question of disease recurrence and or early metastasis. Scattered small nodules throughout the LEFT chest have increased and or are new since previous imaging. Based on basilar distribution of some of these findings would also consider the possibility of aspiration related changes -05/28/2022: Bronchoscopy with bronchial biopsies and FNA.   Cytology: Negative for malignancy.  -08/03/2022: CT chest: Post radiation scarring in the medial left upper lobe with abnormal hypermetabolism seen in the anterior left upper lobe on 04/23/2022, compatible  with residual/recurrent disease. Scattered new nodular densities in the lungs bilaterally, largest in the left lower lobe, compatible with progressive metastatic disease. New subcentimeter hypodense lesion in the left hepatic lobe with bulky periportal adenopathy, indicative of new metastatic disease.  -As the new  recurrence is in the same area as previous recurrence, did not recommend rebiopsy to avoid complications (per documentation) -08/12/2022: Port inserted -08/13/2022: PET: Radiation changes in the left upper lobe with focal hypermetabolism suggestive of residual/recurrent tumor, although improved from prior PET. Dominant 8 mm subpleural nodule in the left lower lobe, unchanged from recent CT and new from prior PET. Metastatic disease not excluded, although infection/inflammation is also possible. Additional prior hypermetabolic nodules on PET have resolved. No evidence of metastatic disease in the abdomen/pelvis. -08/14/2022: MRI Brain: No metastatic disease or acute intracranial abnormality.  -08/17/2022-11/02/2022: 4 cycles of carboplatin , etoposide , and durvalumab   -03/09/2023: CT CAP: Worsened left upper lobe/apical aeration since 10/21/2022. Increased ground-glass and septal thickening in the apex with progressive posterior left upper lobe collapse/consolidation. Considerations include progression of radiation change and/or superimposed infection. Not a typical appearance of local recurrence, which cannot be entirely excluded. Diffuse pulmonary nodularity, including new bilateral lower lobe pulmonary nodules as detailed above. No acute process or evidence of metastatic disease in the abdomen or pelvis. -11/23/2022-current: Maintenance durvalumab   -03/09/2023: CT CAP with contrast: Increased ground-glass and septal thickening in the apex with progressive posterior left upper lobe collapse/consolidation. Considerations include progression of radiation change and/or superimposed infection. Not a typical appearance of local recurrence, which cannot be entirely excluded. Diffuse pulmonary nodularity, including new bilateral lower lobe pulmonary nodules. Given the appearance of the esophagus, this could be related to chronic aspiration. Metastasis cannot be excluded. No acute process or evidence of metastatic  disease in the abdomen or pelvis. Incidental findings, including:  -05/03/2023-12/15/2023: Relatively stable imaging -01/03/2024: MR Brain with contrast : No brain metastases identified.  -04/05/2024: CT CAP:  There is complete opacification of the left upper lobe which is new/progressed since the prior study. There are hyperattenuating masses within, concerning for neoplastic process. There are new heterogeneous opacities in the left lower lobe concerning for pneumonia versus aspiration. There is a new 3 x 5 mm solid noncalcified nodule in the right lung lower lobe. This is nonspecific but favored sequela of infection or inflammation.  Current Treatment:  Maintenance durvalumab  1500 mg every 4 weeks  INTERVAL HISTORY:   Discussed the use of AI scribe software for clinical note transcription with the patient, who gave verbal consent to proceed.  History of Present Illness Claudia [name] is a 79 year old female with small cell carcinoma of the right upper lobe who presents for oncology follow-up and review of recent imaging.She is accompanied by her husband today.    Recent CT imaging demonstrated interval enlargement of a left upper lobe mass, which is under further evaluation. She previously had aspiration pneumonia, with cough and sputum production two weeks ago, but these symptoms have resolved. Currently, she denies cough, dyspnea, fever, chills, or night sweats, and reports overall well-being. She has gained weight since her last visit and has no new complaints.  She describes intermittent temperature dysregulation, alternating between feeling cold and experiencing diaphoresis.  A uterine fibroid was identified on imaging in 2024 and was seen on recent imaging as well. She remains asymptomatic from a gynecologic perspective.   I have reviewed the past medical history, past surgical history, social history and family history with the patient and they are unchanged from  previous  note.  ALLERGIES:  is allergic to azithromycin, tussionex pennkinetic er [hydrocod poli-chlorphe poli er], and omeprazole .  MEDICATIONS:  Current Outpatient Medications  Medication Sig Dispense Refill   cyanocobalamin  (VITAMIN B12) 1000 MCG tablet Take 1,000 mcg by mouth daily.     magnesium  oxide (MAG-OX) 400 (240 Mg) MG tablet TAKE 1 TABLET BY MOUTH DAILY 60 tablet 2   metoCLOPramide  (REGLAN ) 5 MG tablet Take 1 tablet (5 mg total) by mouth 3 (three) times daily before meals. (Patient taking differently: Take 5 mg by mouth 3 (three) times daily before meals. Tried to take before as many meals as possible but does not for all) 180 tablet 3   tretinoin (RETIN-A) 0.1 % cream Apply 1 application  topically at bedtime.     triamcinolone  cream (KENALOG ) 0.1 % Apply 1 Application topically 2 (two) times daily. 60 g 2   Calcium  Carbonate Antacid (TUMS PO) Take 500-1,000 mg by mouth 3 (three) times daily as needed (indigestion/heartburn.). (Patient not taking: Reported on 04/10/2024)     clotrimazole-betamethasone (LOTRISONE) cream Apply topically 2 (two) times daily as needed. (Patient not taking: Reported on 04/10/2024)     omeprazole  (PRILOSEC) 40 MG capsule Take 40 mg by mouth every morning. (Patient not taking: Reported on 04/10/2024)     No current facility-administered medications for this visit.    VITALS:  There were no vitals taken for this visit.  Wt Readings from Last 3 Encounters:  04/10/24 90 lb 13.3 oz (41.2 kg)  03/13/24 86 lb 6.7 oz (39.2 kg)  02/14/24 83 lb 5.3 oz (37.8 kg)    There is no height or weight on file to calculate BMI.  Performance status (ECOG): 2 - Symptomatic, <50% confined to bed  PHYSICAL EXAM:   GENERAL:alert, no distress and comfortable, very thin female SKIN: Erythematous rash on her right hand - improved LYMPH:  no palpable lymphadenopathy in the cervical, axillary or inguinal LUNGS: clear to auscultation and percussion with normal breathing  effort HEART: regular rate & rhythm and no murmurs and no lower extremity edema ABDOMEN:abdomen soft, non-tender and normal bowel sounds Musculoskeletal:no cyanosis of digits and no clubbing  NEURO: alert & oriented x 3 with fluent speech  LABORATORY DATA:  I have reviewed the data as listed   Lab Results  Component Value Date   WBC 4.0 04/10/2024   NEUTROABS 2.9 04/10/2024   HGB 9.0 (L) 04/10/2024   HCT 28.2 (L) 04/10/2024   MCV 98.3 04/10/2024   PLT 161 04/10/2024      Chemistry      Component Value Date/Time   NA 139 04/10/2024 1328   NA 141 11/02/2016 0919   K 4.2 04/10/2024 1328   K 4.2 11/02/2016 0919   CL 102 04/10/2024 1328   CL 103 08/03/2012 0842   CO2 34 (H) 04/10/2024 1328   CO2 29 11/02/2016 0919   BUN 20 04/10/2024 1328   BUN 13.9 11/02/2016 0919   CREATININE 0.72 04/10/2024 1328   CREATININE 0.75 05/25/2022 1417   CREATININE 1.0 11/02/2016 0919      Component Value Date/Time   CALCIUM  9.6 04/10/2024 1328   CALCIUM  10.9 (H) 11/02/2016 0919   ALKPHOS 93 04/10/2024 1328   ALKPHOS 78 11/02/2016 0919   AST 17 04/10/2024 1328   AST 13 (L) 05/25/2022 1417   AST 19 11/02/2016 0919   ALT 7 04/10/2024 1328   ALT 6 05/25/2022 1417   ALT 12 11/02/2016 0919   BILITOT 0.3 04/10/2024 1328  BILITOT 0.5 05/25/2022 1417   BILITOT 0.47 11/02/2016 0919       Latest Reference Range & Units 04/10/24 13:28  TSH 0.350 - 4.500 uIU/mL 2.210    RADIOGRAPHIC STUDIES: I have personally reviewed the radiological images as listed and agreed with the findings in the report.  CT CHEST ABDOMEN PELVIS W CONTRAST CLINICAL DATA:  Small cell lung cancer (SCLC), monitor. * Tracking Code: BO *  EXAM: CT CHEST, ABDOMEN, AND PELVIS WITH CONTRAST  TECHNIQUE: Multidetector CT imaging of the chest, abdomen and pelvis was performed following the standard protocol during bolus administration of intravenous contrast.  RADIATION DOSE REDUCTION: This exam was performed according  to the departmental dose-optimization program which includes automated exposure control, adjustment of the mA and/or kV according to patient size and/or use of iterative reconstruction technique.  CONTRAST:  75mL OMNIPAQUE  IOHEXOL  350 MG/ML SOLN  COMPARISON:  CT scan chest from 12/15/2023 and CT scan chest, abdomen and pelvis from 08/20/2023.  FINDINGS: CT CHEST FINDINGS  Cardiovascular: Normal cardiac size. No pericardial effusion. No aortic aneurysm. There are minimal coronary artery calcifications, in keeping with coronary artery disease. There are also minimal peripheral atherosclerotic vascular calcifications of thoracic aorta and its major branches.  Mediastinum/Nodes: Visualized thyroid  gland appears grossly unremarkable. No solid / cystic mediastinal masses. There is fluid-filled and dilated esophagus, which is nonspecific but most likely seen in the settings of chronic gastroesophageal reflux disease versus esophageal dysmotility. No axillary, mediastinal or hilar lymphadenopathy by size criteria.  Lungs/Pleura: The central tracheo-bronchial tree is patent. Redemonstration of chronic volume loss in the left upper lobe, essentially similar to the prior study. There are postsurgical changes in the anterior segment of right upper lobe.  Mild-to-moderate right upper lobe predominant centrilobular emphysematous changes noted.  There is complete opacification of the left upper lobe which is new/progressed since the prior study from 12/15/2023. There are hyperattenuating masses within the left upper lobe for example, 1.7 x 2.8 cm lesion seen on series 8, image 68). These are highly concerning for neoplastic process. There are new heterogeneous opacities in the left lower lobe concerning for pneumonia versus aspiration. Correlate clinically.  There is significant interval decrease in the heterogeneous opacities in the middle lobe. Redemonstration of  tree-in-bud configuration centrilobular nodules in the middle lobe, similar to the prior study, nonspecific.  There is new small left pleural effusion with small areas of loculations. There is also new small right pleural effusion with loculations as well. No pneumothorax. There is a new 3 x 5 mm solid noncalcified nodule in the right lung lower lobe (series 6, image 63). This is nonspecific but favored sequela of infection or inflammation. Attention on follow-up examination is recommended in 3 months. There are multiple additional sub 6 mm nodules throughout bilateral lungs, essentially unchanged.  Musculoskeletal: A CT Port-a-Cath is seen in the right upper chest wall with the catheter terminating in the cavo-atrial junction region. Visualized soft tissues of the chest wall are otherwise grossly unremarkable. No suspicious osseous lesions. There are mild to moderate multilevel degenerative changes in the visualized spine.  CT ABDOMEN PELVIS FINDINGS  Hepatobiliary: The liver is normal in size. Non-cirrhotic configuration. No suspicious mass. These is mild diffuse hepatic steatosis. No intrahepatic or extrahepatic bile duct dilation. No calcified gallstones. Normal gallbladder wall thickness. No pericholecystic inflammatory changes.  Pancreas: Unremarkable. No pancreatic ductal dilatation or surrounding inflammatory changes.  Spleen: Within normal limits. No focal lesion.  Adrenals/Urinary Tract: Adrenal glands are unremarkable. No suspicious renal mass.  Redemonstration of multiple simple cysts throughout bilateral kidneys with largest arising from the left kidney upper pole medially measuring up to 2.9 x 3.5 cm. No nephroureterolithiasis or obstructive uropathy. Urinary bladder is under distended, precluding optimal assessment. However, no large mass or stones identified. No perivesical fat stranding.  Stomach/Bowel: No disproportionate dilation of the small or large bowel  loops. No evidence of abnormal bowel wall thickening or inflammatory changes. The appendix is unremarkable.  Vascular/Lymphatic: No ascites or pneumoperitoneum. No abdominal or pelvic lymphadenopathy, by size criteria. No aneurysmal dilation of the major abdominal arteries. There are moderate peripheral atherosclerotic vascular calcifications of the aorta and its major branches.  Reproductive: Slightly bulky postmenopausal anteverted uterus. There are at least 2 hyperattenuating areas within the central uterus with largest measuring up to 1.2 x 1.3 cm. These are incompletely characterized on this exam. Further evaluation with pelvic ultrasound or MRI pelvis is recommended unless recently performed. These may represent submucosal/intracavitary leiomyoma, endometrial polyp, endometrial neoplastic process, etc.  Other: The visualized soft tissues and abdominal wall are unremarkable.  Musculoskeletal: No suspicious osseous lesions. There are moderate multilevel degenerative changes in the visualized spine.  IMPRESSION: 1. There is complete opacification of the left upper lobe which is new/progressed since the prior study. There are hyperattenuating masses within, concerning for neoplastic process. 2. There are new heterogeneous opacities in the left lower lobe concerning for pneumonia versus aspiration. 3. There is new small left pleural effusion with small areas of loculations. There is also new small right pleural effusion with loculations as well. 4. There is a new 3 x 5 mm solid noncalcified nodule in the right lung lower lobe. This is nonspecific but favored sequela of infection or inflammation. Attention on follow-up examination is recommended in 3 months. 5. There are at least 2 hyperattenuating areas within the central uterus with largest measuring up to 1.2 x 1.3 cm. These are incompletely characterized on this exam. Further evaluation with pelvic ultrasound or MRI pelvis  is recommended unless recently performed. These may represent submucosal/intracavitary leiomyoma, endometrial polyp, endometrial neoplastic process, etc. 6. Multiple other nonacute observations, as described above.  Aortic Atherosclerosis (ICD10-I70.0) and Emphysema (ICD10-J43.9).  Electronically Signed   By: Ree Molt M.D.   On: 04/10/2024 09:11   "

## 2024-04-10 ENCOUNTER — Inpatient Hospital Stay

## 2024-04-10 ENCOUNTER — Inpatient Hospital Stay: Attending: Nurse Practitioner

## 2024-04-10 ENCOUNTER — Inpatient Hospital Stay: Admitting: Oncology

## 2024-04-10 DIAGNOSIS — D6481 Anemia due to antineoplastic chemotherapy: Secondary | ICD-10-CM

## 2024-04-10 DIAGNOSIS — R21 Rash and other nonspecific skin eruption: Secondary | ICD-10-CM | POA: Diagnosis not present

## 2024-04-10 DIAGNOSIS — C3411 Malignant neoplasm of upper lobe, right bronchus or lung: Secondary | ICD-10-CM | POA: Diagnosis not present

## 2024-04-10 DIAGNOSIS — R1900 Intra-abdominal and pelvic swelling, mass and lump, unspecified site: Secondary | ICD-10-CM | POA: Diagnosis not present

## 2024-04-10 DIAGNOSIS — C3481 Malignant neoplasm of overlapping sites of right bronchus and lung: Secondary | ICD-10-CM | POA: Insufficient documentation

## 2024-04-10 DIAGNOSIS — Z79620 Long term (current) use of immunosuppressive biologic: Secondary | ICD-10-CM | POA: Diagnosis not present

## 2024-04-10 DIAGNOSIS — T451X5A Adverse effect of antineoplastic and immunosuppressive drugs, initial encounter: Secondary | ICD-10-CM | POA: Diagnosis not present

## 2024-04-10 LAB — COMPREHENSIVE METABOLIC PANEL WITH GFR
ALT: 7 U/L (ref 0–44)
AST: 17 U/L (ref 15–41)
Albumin: 3.4 g/dL — ABNORMAL LOW (ref 3.5–5.0)
Alkaline Phosphatase: 93 U/L (ref 38–126)
Anion gap: 3 — ABNORMAL LOW (ref 5–15)
BUN: 20 mg/dL (ref 8–23)
CO2: 34 mmol/L — ABNORMAL HIGH (ref 22–32)
Calcium: 9.6 mg/dL (ref 8.9–10.3)
Chloride: 102 mmol/L (ref 98–111)
Creatinine, Ser: 0.72 mg/dL (ref 0.44–1.00)
GFR, Estimated: >60 mL/min
Glucose, Bld: 87 mg/dL (ref 70–99)
Potassium: 4.2 mmol/L (ref 3.5–5.1)
Sodium: 139 mmol/L (ref 135–145)
Total Bilirubin: 0.3 mg/dL (ref 0.0–1.2)
Total Protein: 6.4 g/dL — ABNORMAL LOW (ref 6.5–8.1)

## 2024-04-10 LAB — CBC WITH DIFFERENTIAL/PLATELET
Abs Immature Granulocytes: 0.06 K/uL (ref 0.00–0.07)
Basophils Absolute: 0 K/uL (ref 0.0–0.1)
Basophils Relative: 1 %
Eosinophils Absolute: 0 K/uL (ref 0.0–0.5)
Eosinophils Relative: 1 %
HCT: 28.2 % — ABNORMAL LOW (ref 36.0–46.0)
Hemoglobin: 9 g/dL — ABNORMAL LOW (ref 12.0–15.0)
Immature Granulocytes: 2 %
Lymphocytes Relative: 9 %
Lymphs Abs: 0.4 K/uL — ABNORMAL LOW (ref 0.7–4.0)
MCH: 31.4 pg (ref 26.0–34.0)
MCHC: 31.9 g/dL (ref 30.0–36.0)
MCV: 98.3 fL (ref 80.0–100.0)
Monocytes Absolute: 0.7 K/uL (ref 0.1–1.0)
Monocytes Relative: 17 %
Neutro Abs: 2.9 K/uL (ref 1.7–7.7)
Neutrophils Relative %: 70 %
Platelets: 161 K/uL (ref 150–400)
RBC: 2.87 MIL/uL — ABNORMAL LOW (ref 3.87–5.11)
RDW: 14.6 % (ref 11.5–15.5)
WBC: 4 K/uL (ref 4.0–10.5)
nRBC: 0 % (ref 0.0–0.2)

## 2024-04-10 LAB — MAGNESIUM: Magnesium: 2 mg/dL (ref 1.7–2.4)

## 2024-04-10 LAB — TSH: TSH: 2.21 u[IU]/mL (ref 0.350–4.500)

## 2024-04-10 NOTE — Patient Instructions (Signed)
 [facility] at [facility] Discharge Instructions   You were seen and examined today by Dr. Davonna.  She reviewed the results of your lab work which are normal/stable.   She reviewed the results of your CT scan. There are some areas in the lungs that are concerning for the cancer progressing. We will arrange for you to have a PET scan to investigate this further.   We will proceed with your treatment today.   Return as scheduled.    Thank you for choosing [facility] at [facility] to provide your oncology and hematology care.  To afford each patient quality time with our provider, please arrive at least 15 minutes before your scheduled appointment time.   If you have a lab appointment with the Cancer Center please come in thru the Main Entrance and check in at the main information desk.  You need to re-schedule your appointment should you arrive 10 or more minutes late.  We strive to give you quality time with our providers, and arriving late affects you and other patients whose appointments are after yours.  Also, if you no show three or more times for appointments you may be dismissed from the clinic at the providers discretion.     Again, thank you for choosing [facility].  Our hope is that these requests will decrease the amount of time that you wait before being seen by our physicians.       _____________________________________________________________  Should you have questions after your visit to [facility], please contact our office at [phone] and follow the prompts.  Our office hours are 8:00 a.m. and 4:30 p.m. Monday - Friday.  Please note that voicemails left after 4:00 p.m. may not be returned until the following business day.  We are closed weekends and major holidays.  You do have access to a nurse 24-7, just call the main number to the clinic [phone] and do not press any options, hold on the  line and a nurse will answer the phone.    For prescription refill requests, have your pharmacy contact our office and allow 72 hours.    Due to Covid, you will need to wear a mask upon entering the hospital. If you do not have a mask, a mask will be given to you at the Main Entrance upon arrival. For doctor visits, patients may have 1 support person age 71 or older with them. For treatment visits, patients can not have anyone with them due to social distancing guidelines and our immunocompromised population.

## 2024-04-10 NOTE — Progress Notes (Signed)
 Patients port flushed without difficulty.  Good blood return noted with no bruising or swelling noted at site. Patient remains accessed for treatment.

## 2024-04-10 NOTE — Progress Notes (Signed)
 Patient will not receive treatment today per Dr.Kandala.

## 2024-04-14 ENCOUNTER — Ambulatory Visit (HOSPITAL_COMMUNITY)
Admission: RE | Admit: 2024-04-14 | Discharge: 2024-04-14 | Disposition: A | Discharge status: Home / Self Care | Source: Ambulatory Visit | Attending: Oncology | Admitting: Oncology

## 2024-04-14 DIAGNOSIS — R1900 Intra-abdominal and pelvic swelling, mass and lump, unspecified site: Secondary | ICD-10-CM | POA: Insufficient documentation

## 2024-04-16 ENCOUNTER — Encounter: Payer: Self-pay | Admitting: Oncology

## 2024-04-27 ENCOUNTER — Ambulatory Visit (HOSPITAL_COMMUNITY)
Admission: RE | Admit: 2024-04-27 | Discharge: 2024-04-27 | Disposition: A | Discharge status: Home / Self Care | Source: Ambulatory Visit | Attending: Oncology | Admitting: Oncology

## 2024-04-27 DIAGNOSIS — C3411 Malignant neoplasm of upper lobe, right bronchus or lung: Secondary | ICD-10-CM | POA: Insufficient documentation

## 2024-04-27 MED ORDER — FLUDEOXYGLUCOSE F - 18 (FDG) INJECTION
5.3800 | Freq: Once | INTRAVENOUS | Status: AC | PRN
  Administered 2024-04-27: 5.38 via INTRAVENOUS

## 2024-05-04 ENCOUNTER — Other Ambulatory Visit: Payer: Self-pay

## 2024-05-04 NOTE — Progress Notes (Signed)
 The proposed treatment discussed in conference is for discussion purpose only and is not a binding recommendation.  The patients have not been physically examined, or presented with their treatment options.  Therefore, final treatment plans cannot be decided.

## 2024-05-08 ENCOUNTER — Inpatient Hospital Stay

## 2024-05-08 ENCOUNTER — Inpatient Hospital Stay: Attending: Nurse Practitioner

## 2024-05-08 ENCOUNTER — Inpatient Hospital Stay: Admitting: Oncology

## 2024-06-05 ENCOUNTER — Inpatient Hospital Stay: Attending: Nurse Practitioner

## 2024-06-05 ENCOUNTER — Inpatient Hospital Stay: Admitting: Oncology

## 2024-06-05 ENCOUNTER — Inpatient Hospital Stay
